# Patient Record
Sex: Male | Born: 1980
Health system: Southern US, Community
[De-identification: ages and names within clinical notes are randomized; demographics above are authoritative.]

## PROBLEM LIST (undated history)

## (undated) DIAGNOSIS — F32A Depression, unspecified: Secondary | ICD-10-CM

## (undated) DIAGNOSIS — E1165 Type 2 diabetes mellitus with hyperglycemia: Secondary | ICD-10-CM

## (undated) DIAGNOSIS — M109 Gout, unspecified: Secondary | ICD-10-CM

## (undated) DIAGNOSIS — L03115 Cellulitis of right lower limb: Secondary | ICD-10-CM

## (undated) DIAGNOSIS — M1990 Unspecified osteoarthritis, unspecified site: Secondary | ICD-10-CM

## (undated) DIAGNOSIS — E785 Hyperlipidemia, unspecified: Secondary | ICD-10-CM

## (undated) DIAGNOSIS — E7800 Pure hypercholesterolemia, unspecified: Secondary | ICD-10-CM

## (undated) DIAGNOSIS — E119 Type 2 diabetes mellitus without complications: Secondary | ICD-10-CM

## (undated) DIAGNOSIS — K219 Gastro-esophageal reflux disease without esophagitis: Secondary | ICD-10-CM

## (undated) DIAGNOSIS — I10 Essential (primary) hypertension: Secondary | ICD-10-CM

## (undated) DIAGNOSIS — I639 Cerebral infarction, unspecified: Secondary | ICD-10-CM

## (undated) DIAGNOSIS — G4733 Obstructive sleep apnea (adult) (pediatric): Secondary | ICD-10-CM

## (undated) DIAGNOSIS — U071 COVID-19: Secondary | ICD-10-CM

## (undated) DIAGNOSIS — R569 Unspecified convulsions: Secondary | ICD-10-CM

## (undated) DIAGNOSIS — N179 Acute kidney failure, unspecified: Secondary | ICD-10-CM

## (undated) HISTORY — DX: Gout, unspecified: M10.9

## (undated) HISTORY — DX: Depression, unspecified: F32.A

## (undated) HISTORY — DX: Unspecified osteoarthritis, unspecified site: M19.90

## (undated) HISTORY — DX: Gastro-esophageal reflux disease without esophagitis: K21.9

## (undated) HISTORY — DX: Type 2 diabetes mellitus with hyperglycemia: E11.65

## (undated) HISTORY — PX: SHOULDER SURGERY: SHX246

## (undated) HISTORY — DX: Hyperlipidemia, unspecified: E78.5

## (undated) HISTORY — DX: Pure hypercholesterolemia, unspecified: E78.00

## (undated) HISTORY — DX: Type 2 diabetes mellitus without complications: E11.9

## (undated) HISTORY — PX: OTHER SURGICAL HISTORY: SHX169

## (undated) HISTORY — DX: COVID-19: U07.1

## (undated) HISTORY — DX: Cellulitis of right lower limb: L03.115

## (undated) HISTORY — DX: Unspecified convulsions: R56.9

## (undated) HISTORY — DX: Essential (primary) hypertension: I10

## (undated) HISTORY — PX: INCISION AND DRAINAGE FOOT: SHX1800

---

## 2001-02-01 ENCOUNTER — Emergency Department (HOSPITAL_COMMUNITY): Admission: EM | Admit: 2001-02-01 | Discharge: 2001-02-01 | Payer: Self-pay

## 2003-06-10 ENCOUNTER — Emergency Department (HOSPITAL_COMMUNITY): Admission: AD | Admit: 2003-06-10 | Discharge: 2003-06-10 | Payer: Self-pay | Admitting: Family Medicine

## 2005-11-13 ENCOUNTER — Emergency Department (HOSPITAL_COMMUNITY): Admission: EM | Admit: 2005-11-13 | Discharge: 2005-11-13 | Payer: Self-pay | Admitting: Emergency Medicine

## 2005-11-13 IMAGING — CT CT ABD /PELWO
2 series · 16 of 42 positions shown, 20 images · non-contrast
Comparison: none

______________________________________________________________
RYKER [DATE] RYKER [DATE]

REASON FOR CONSULTATION: R/O KIDNEY MASS
_____________________________
_______________
EXAM: ULTRASOUND ABDOMEN GEN SURV.
The liver, gallbladder, pancreas, spleen and kidneys are normal in
appearance. The common bile duct is normal at 5 mm in diameter. There
is no evidence of ascites. Incidental note is made of a fold within the
gallbladder which is a normal variant. No stones were seen.

[Series 2: wo · axial · 0.94mm/px · z∈[+329,+806]mm · 13 of 177 slices shown, 16 images]
[im 12/177  soft-tissue]
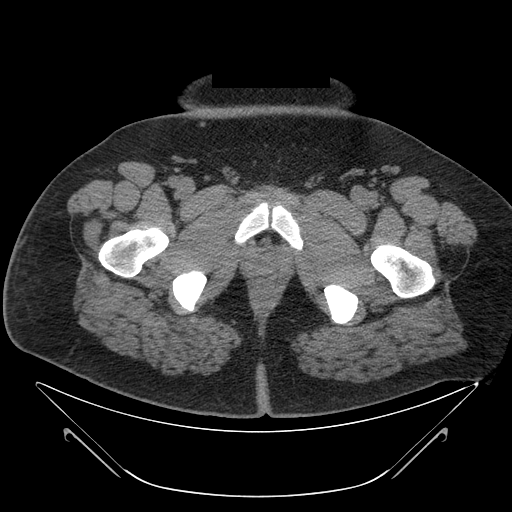
[im 12/177  bone]
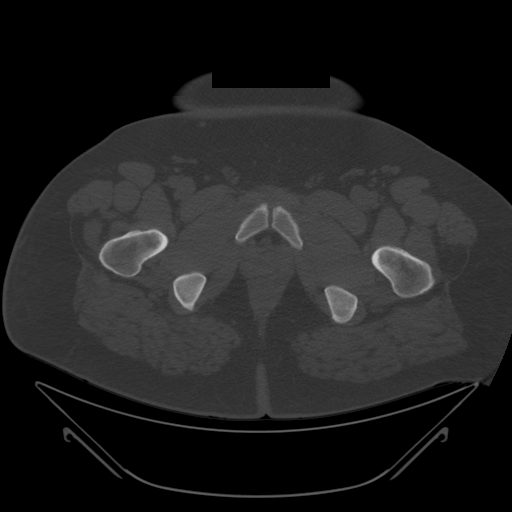
[im 29/177  soft-tissue]
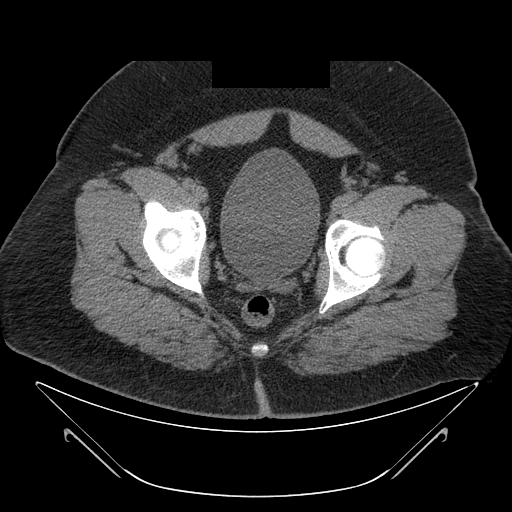
[im 46/177  soft-tissue]
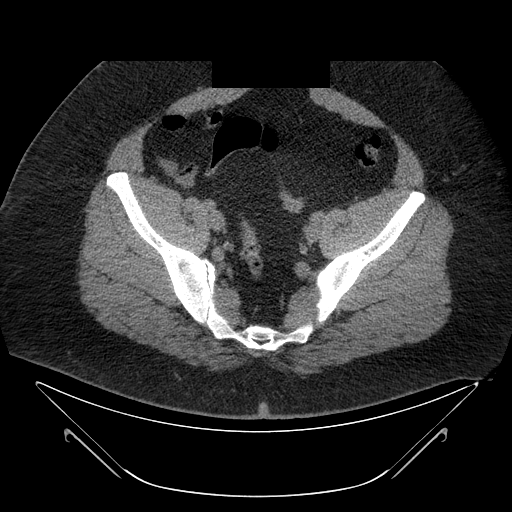
[im 63/177  soft-tissue]
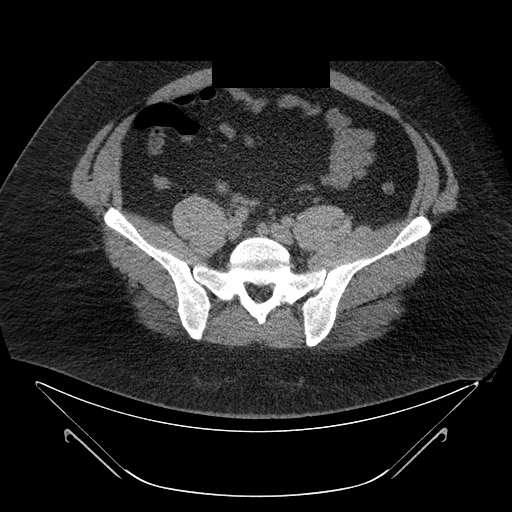
[im 80/177  soft-tissue]
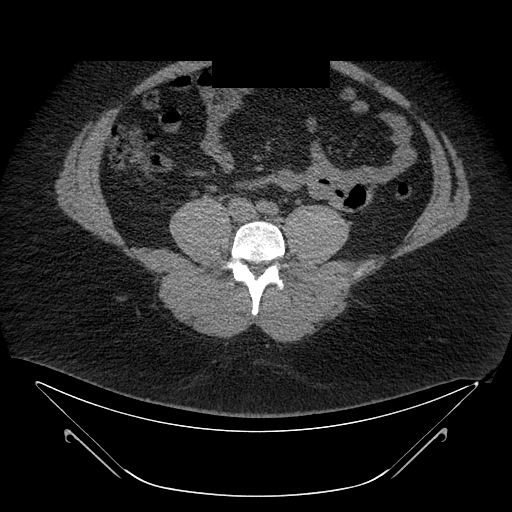
[im 97/177  soft-tissue]
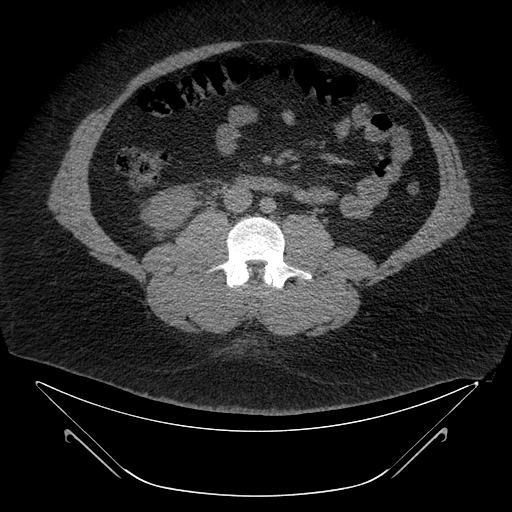
[im 114/177  soft-tissue]
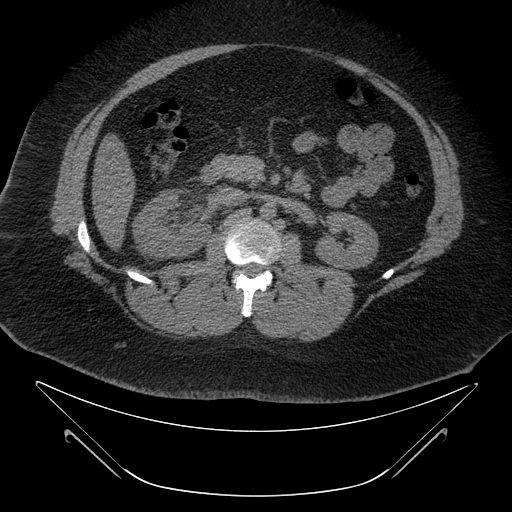
[im 131/177  soft-tissue]
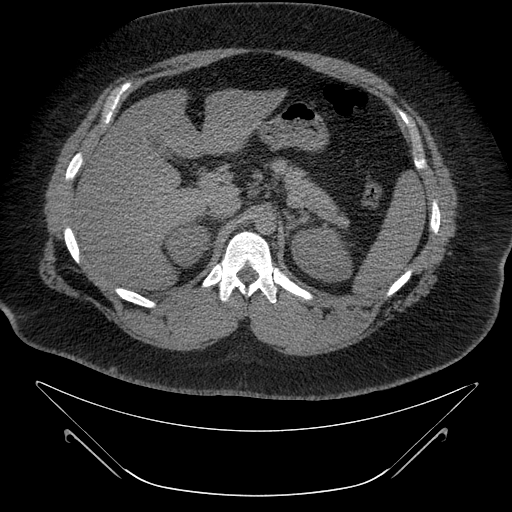
[im 148/177  soft-tissue]
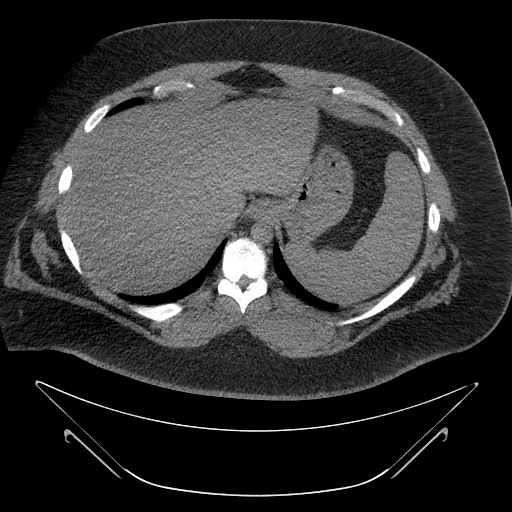
[im 148/177  bone]
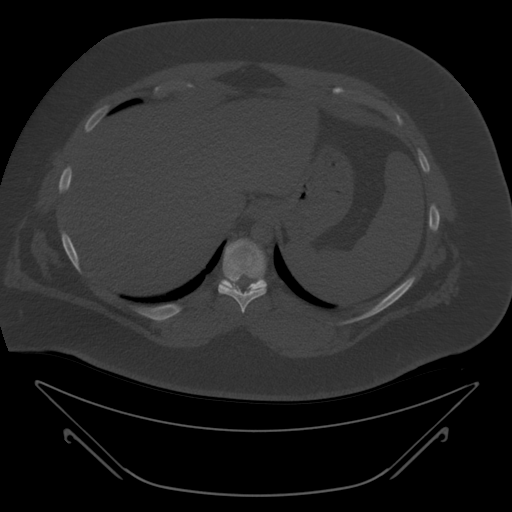
[im 154/177  lung]
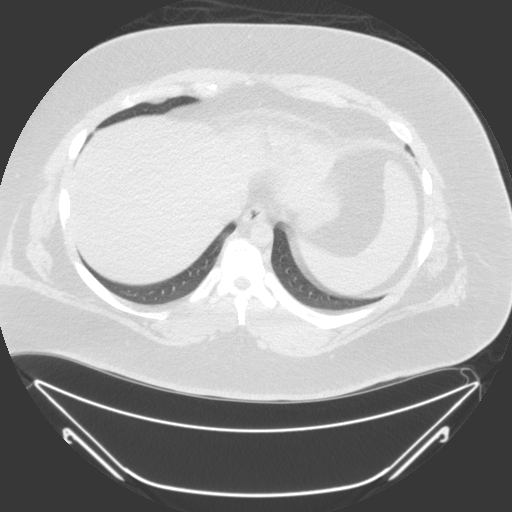
[im 159/177  lung]
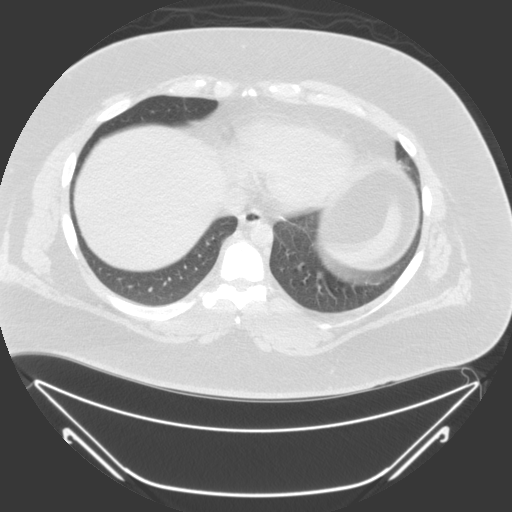
[im 165/177  soft-tissue]
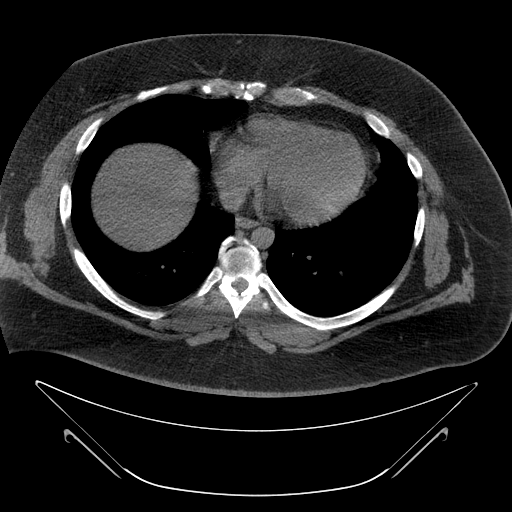
[im 165/177  lung]
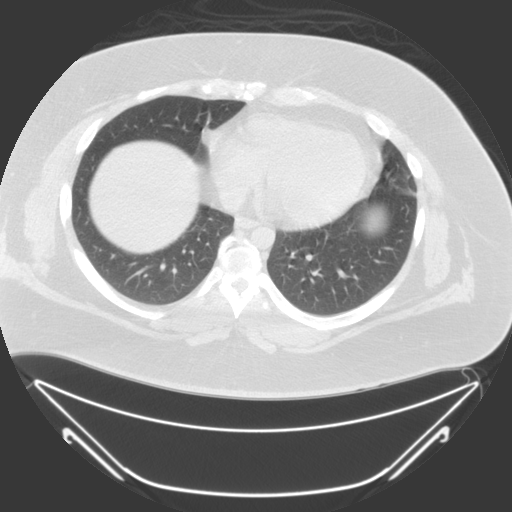
[im 171/177  lung]
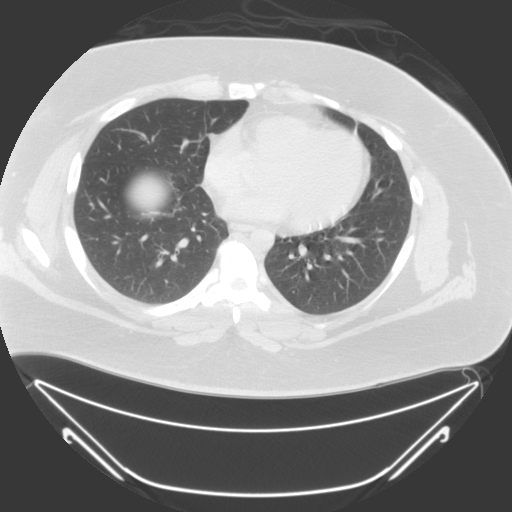

[coronals · coronal · 1.03mm/px · 3 of 104 slices shown, 4 images]
[im 35/104  soft-tissue]
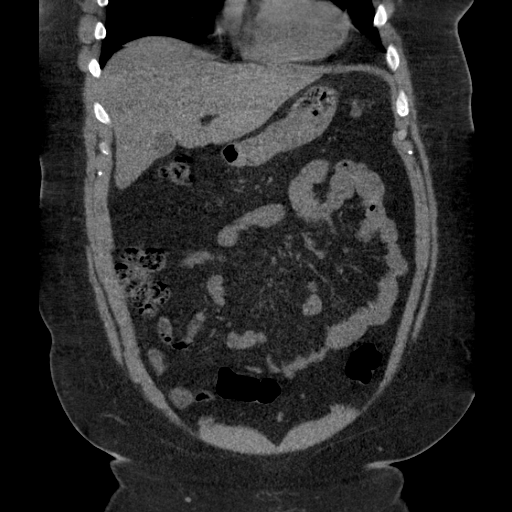
[im 46/104  soft-tissue]
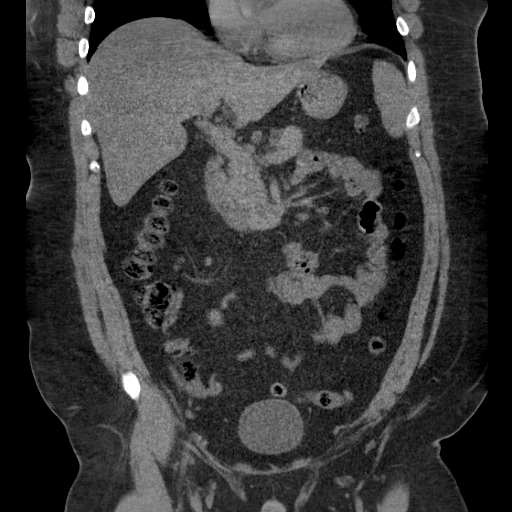
[im 46/104  bone]
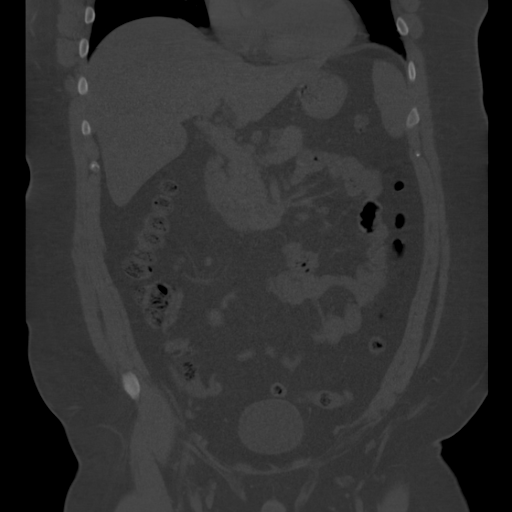
[im 58/104  soft-tissue]
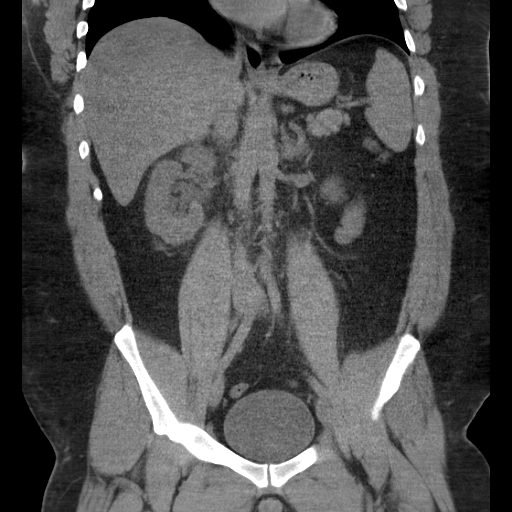

[16 of 42 positions shown; findings below may reference images not displayed]

IMPRESSION: 1. Normal abdominal ultrasound. No renal mass was identified either.
 If there remains strong clinical suspicion of a renal mass, then a
 dedicated CT scan without and with contrast would be a useful next
 step.

[VZ] [DATE] [DATE]
 Patient RYKER [DATE]

## 2008-11-20 ENCOUNTER — Emergency Department (HOSPITAL_BASED_OUTPATIENT_CLINIC_OR_DEPARTMENT_OTHER): Admission: EM | Admit: 2008-11-20 | Discharge: 2008-11-20 | Payer: Self-pay | Admitting: Emergency Medicine

## 2008-11-20 ENCOUNTER — Ambulatory Visit: Payer: Self-pay | Admitting: Interventional Radiology

## 2008-11-20 IMAGING — CR DG CHEST 2V
3 series · 3 of 3 positions shown · non-contrast
Comparison: None

CLINICAL DATA: Fever and cough

CHEST - 2 VIEW

[w chest pa]
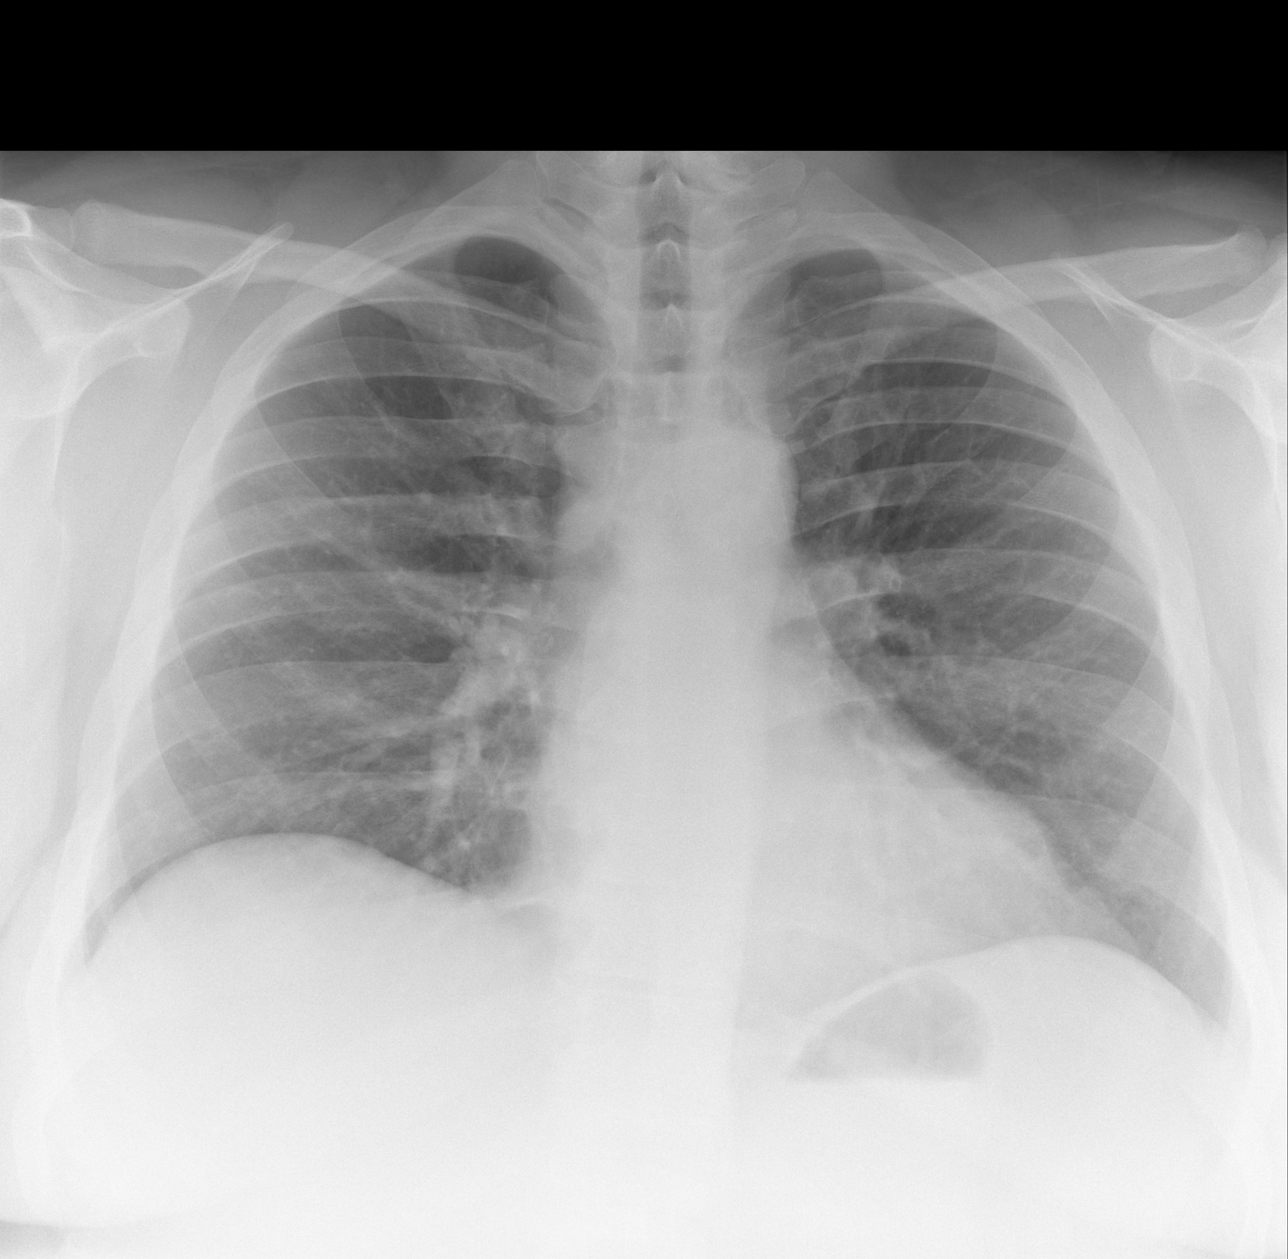

[w chest lat]
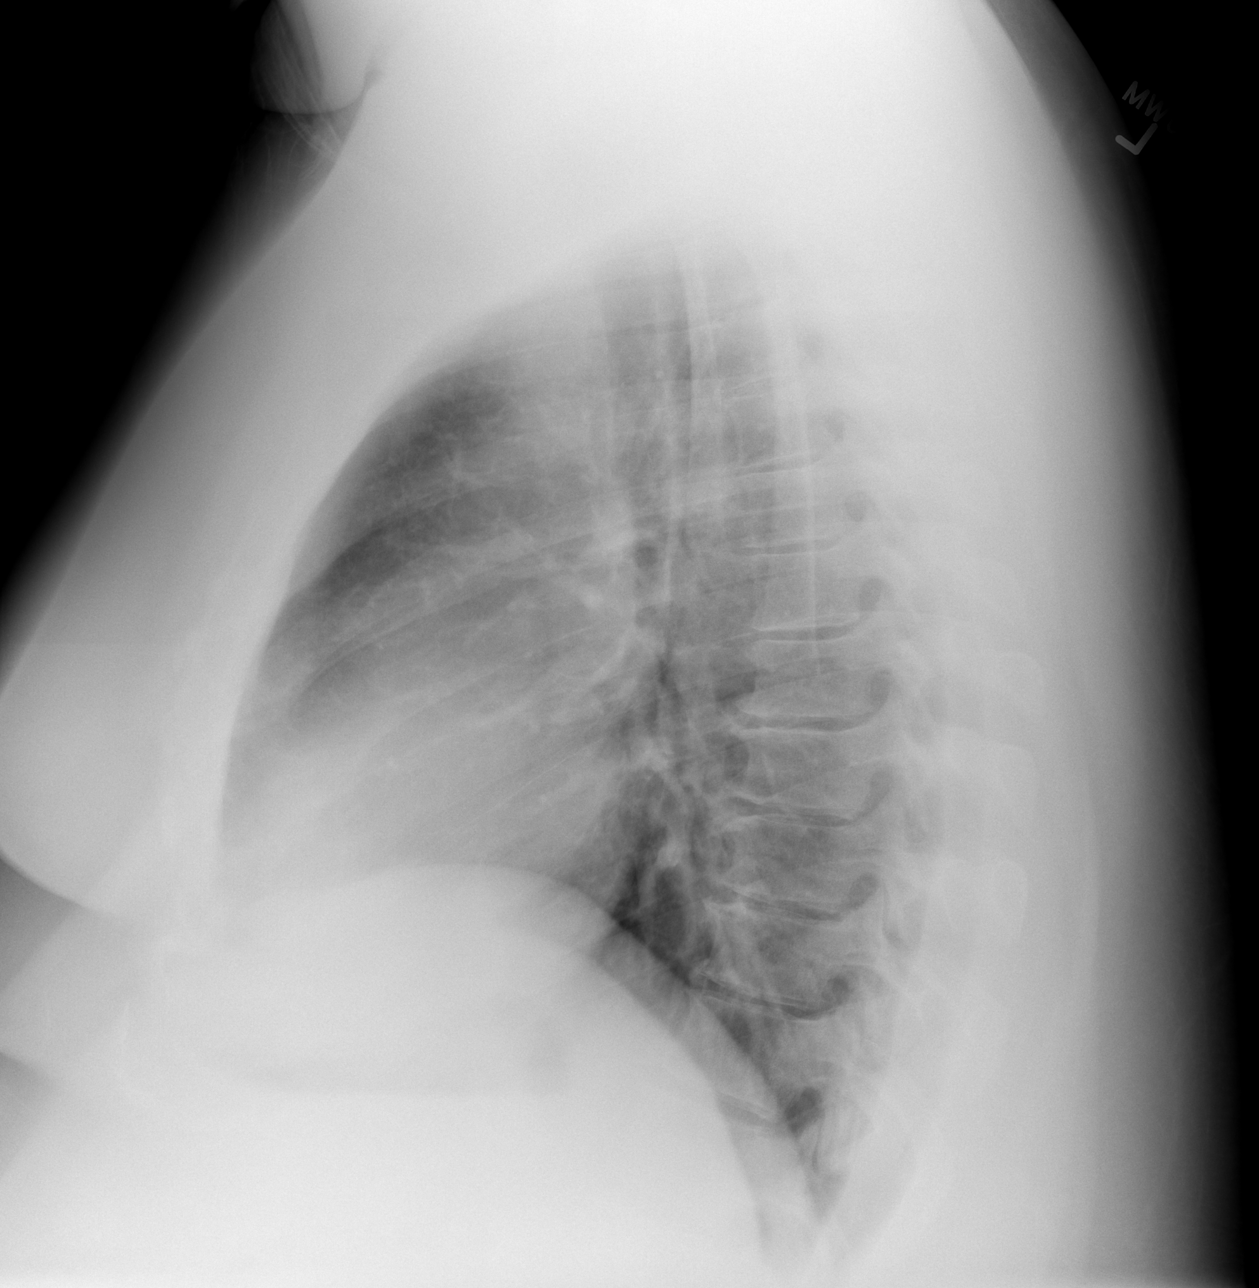

[w chest ap]
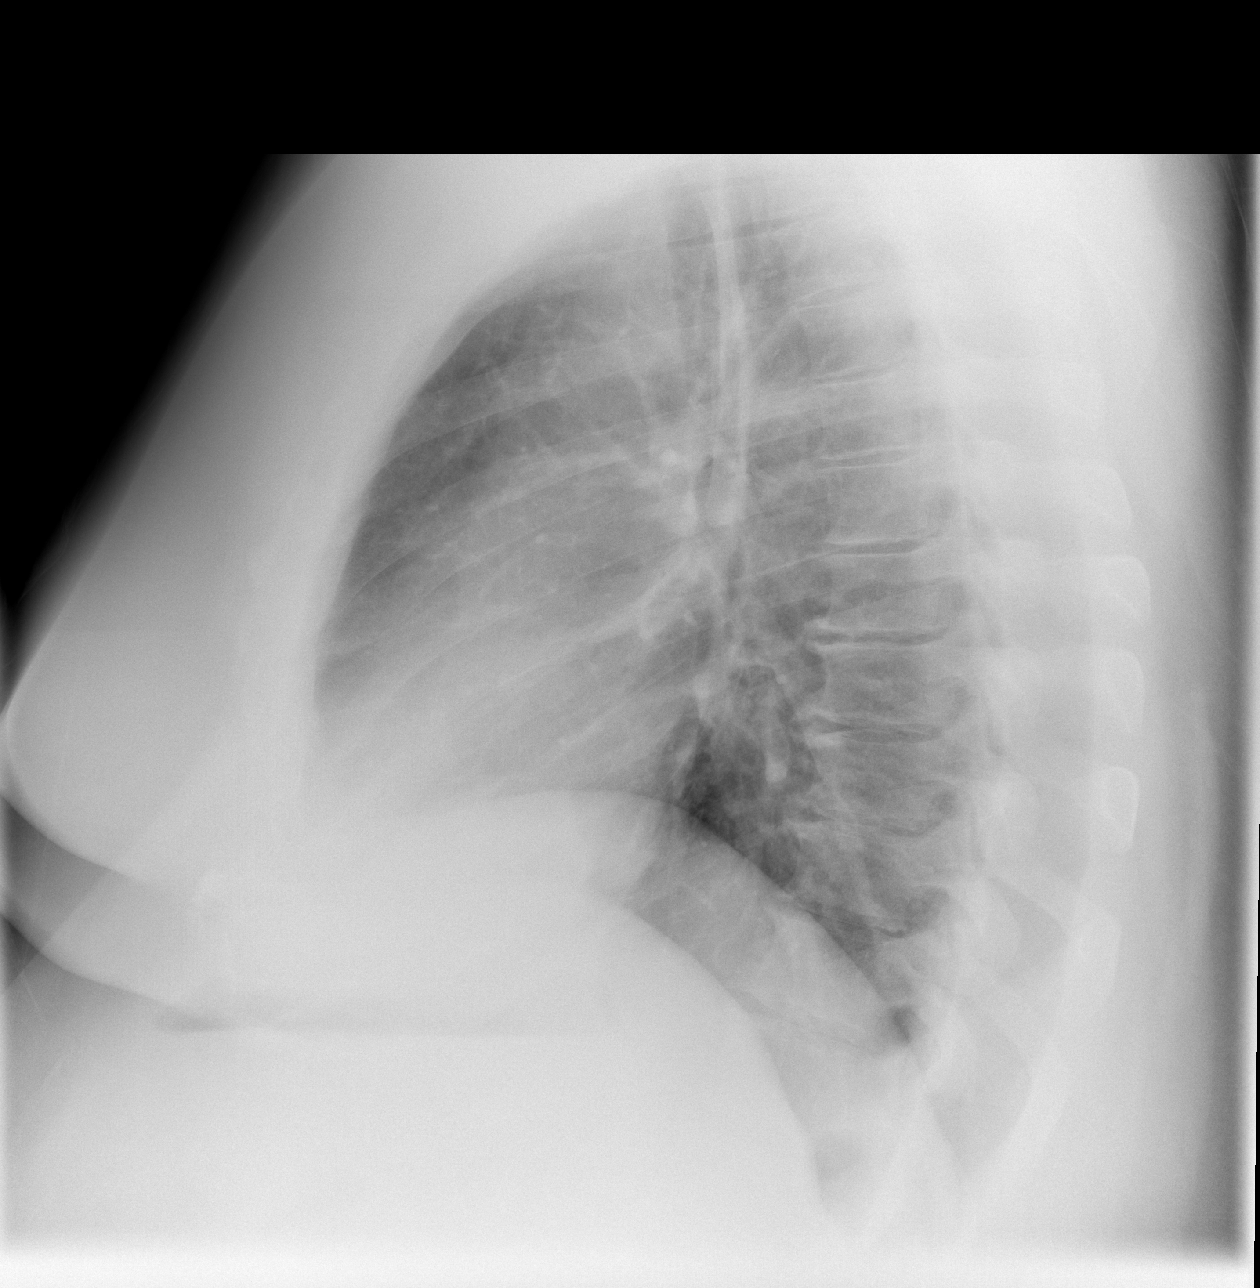

[3 of 3 positions shown; findings below may reference images not displayed]

FINDINGS: The heart size and mediastinal contours are within normal
limits.  Both lungs are clear.  The visualized skeletal structures
are unremarkable.
IMPRESSION: No active disease.

## 2010-10-14 LAB — CBC
HCT: 45.5 % (ref 39.0–52.0)
Hemoglobin: 15.5 g/dL (ref 13.0–17.0)
MCHC: 34.1 g/dL (ref 30.0–36.0)
MCV: 87 fL (ref 78.0–100.0)
Platelets: 171 10*3/uL (ref 150–400)
RBC: 5.23 MIL/uL (ref 4.22–5.81)
RDW: 11.8 % (ref 11.5–15.5)
WBC: 12 10*3/uL — ABNORMAL HIGH (ref 4.0–10.5)

## 2010-10-14 LAB — BASIC METABOLIC PANEL
BUN: 13 mg/dL (ref 6–23)
CO2: 29 mEq/L (ref 19–32)
Calcium: 9.5 mg/dL (ref 8.4–10.5)
Chloride: 102 mEq/L (ref 96–112)
Creatinine, Ser: 1.1 mg/dL (ref 0.4–1.5)
GFR calc Af Amer: >60 mL/min (ref >60)
GFR calc non Af Amer: >60 mL/min (ref >60)
Glucose, Bld: 116 mg/dL — ABNORMAL HIGH (ref 70–99)
Potassium: 4 mEq/L (ref 3.5–5.1)
Sodium: 143 mEq/L (ref 135–145)

## 2010-10-14 LAB — DIFFERENTIAL
Basophils Absolute: 0.2 10*3/uL — ABNORMAL HIGH (ref 0.0–0.1)
Basophils Relative: 2 % — ABNORMAL HIGH (ref 0–1)
Eosinophils Absolute: 0 10*3/uL (ref 0.0–0.7)
Eosinophils Relative: 0 % (ref 0–5)
Lymphocytes Relative: 19 % (ref 12–46)
Lymphs Abs: 2.3 10*3/uL (ref 0.7–4.0)
Monocytes Absolute: 1.4 10*3/uL — ABNORMAL HIGH (ref 0.1–1.0)
Monocytes Relative: 11 % (ref 3–12)
Neutro Abs: 8.1 10*3/uL — ABNORMAL HIGH (ref 1.7–7.7)
Neutrophils Relative %: 67 % (ref 43–77)

## 2010-10-14 LAB — URINALYSIS, ROUTINE W REFLEX MICROSCOPIC
Bilirubin Urine: NEGATIVE
Glucose, UA: NEGATIVE mg/dL
Hgb urine dipstick: NEGATIVE
Ketones, ur: NEGATIVE mg/dL
Protein, ur: NEGATIVE mg/dL
pH: 7 (ref 5.0–8.0)

## 2010-10-14 LAB — URINE CULTURE: Colony Count: 7000

## 2010-10-14 LAB — URINE MICROSCOPIC-ADD ON

## 2011-09-02 ENCOUNTER — Other Ambulatory Visit (HOSPITAL_COMMUNITY): Payer: Self-pay | Admitting: Orthopaedic Surgery

## 2011-09-03 ENCOUNTER — Encounter (HOSPITAL_COMMUNITY): Payer: Self-pay

## 2011-09-03 ENCOUNTER — Other Ambulatory Visit: Payer: Self-pay

## 2011-09-03 ENCOUNTER — Encounter (HOSPITAL_COMMUNITY)
Admission: RE | Admit: 2011-09-03 | Discharge: 2011-09-03 | Disposition: A | Discharge status: Home / Self Care | Payer: PRIVATE HEALTH INSURANCE | Source: Ambulatory Visit | Attending: Orthopaedic Surgery | Admitting: Orthopaedic Surgery

## 2011-09-03 ENCOUNTER — Encounter (HOSPITAL_COMMUNITY)
Admission: RE | Admit: 2011-09-03 | Discharge: 2011-09-03 | Disposition: A | Discharge status: Home / Self Care | Payer: PRIVATE HEALTH INSURANCE | Source: Ambulatory Visit | Attending: Anesthesiology | Admitting: Anesthesiology

## 2011-09-03 HISTORY — DX: Gastro-esophageal reflux disease without esophagitis: K21.9

## 2011-09-03 HISTORY — DX: Essential (primary) hypertension: I10

## 2011-09-03 LAB — CBC
HCT: 45 % (ref 39.0–52.0)
Hemoglobin: 16.1 g/dL (ref 13.0–17.0)
MCH: 29.9 pg (ref 26.0–34.0)
MCHC: 35.8 g/dL (ref 30.0–36.0)
RDW: 12.4 % (ref 11.5–15.5)

## 2011-09-03 LAB — BASIC METABOLIC PANEL
BUN: 11 mg/dL (ref 6–23)
Chloride: 102 mEq/L (ref 96–112)
Creatinine, Ser: 1.02 mg/dL (ref 0.50–1.35)
GFR calc non Af Amer: >90 mL/min (ref >90)
Glucose, Bld: 88 mg/dL (ref 70–99)
Potassium: 4.2 mEq/L (ref 3.5–5.1)

## 2011-09-03 IMAGING — CR DG CHEST 2V
2 series · 2 of 2 positions shown · non-contrast
Comparison: [DATE]

CLINICAL DATA: Preoperative evaluation for rotator cuff repair.
Hypertension.  Gastroesophageal reflux disease.  Nonsmoker

CHEST - 2 VIEW

[view not recorded (1 of 2)]
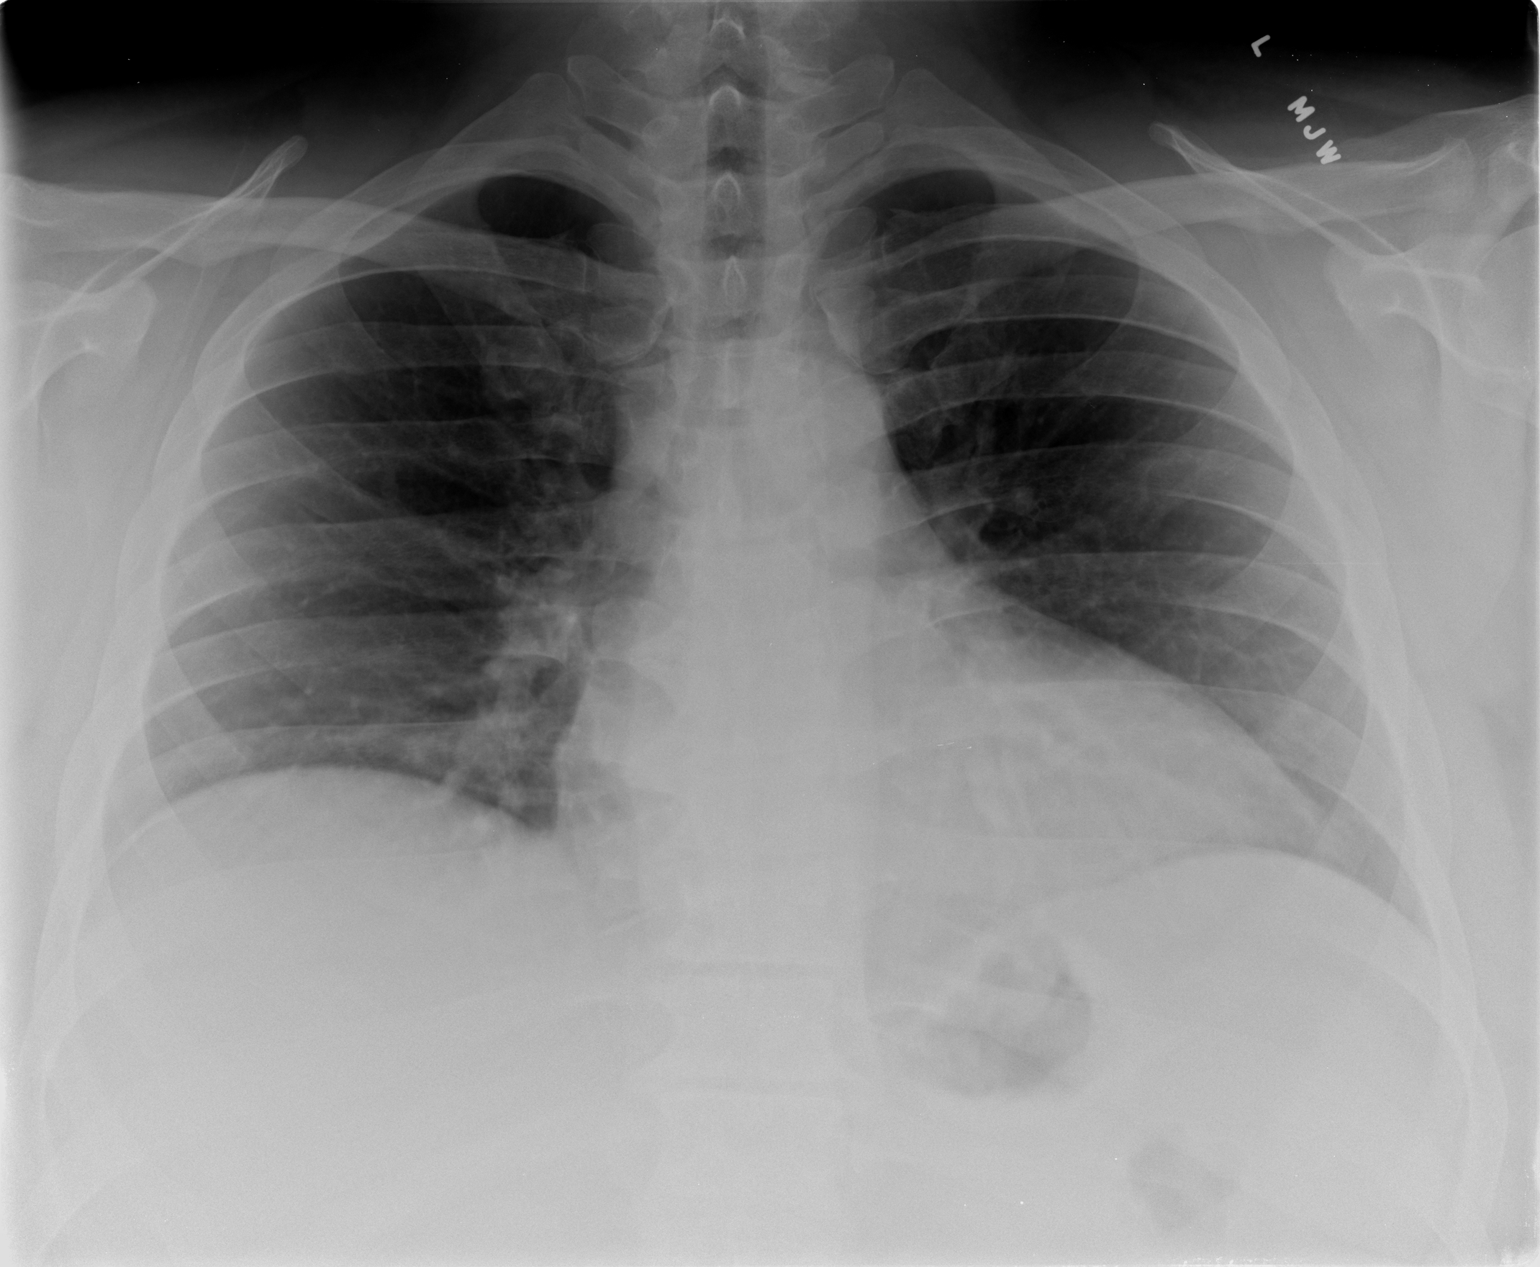

[view not recorded (2 of 2)]
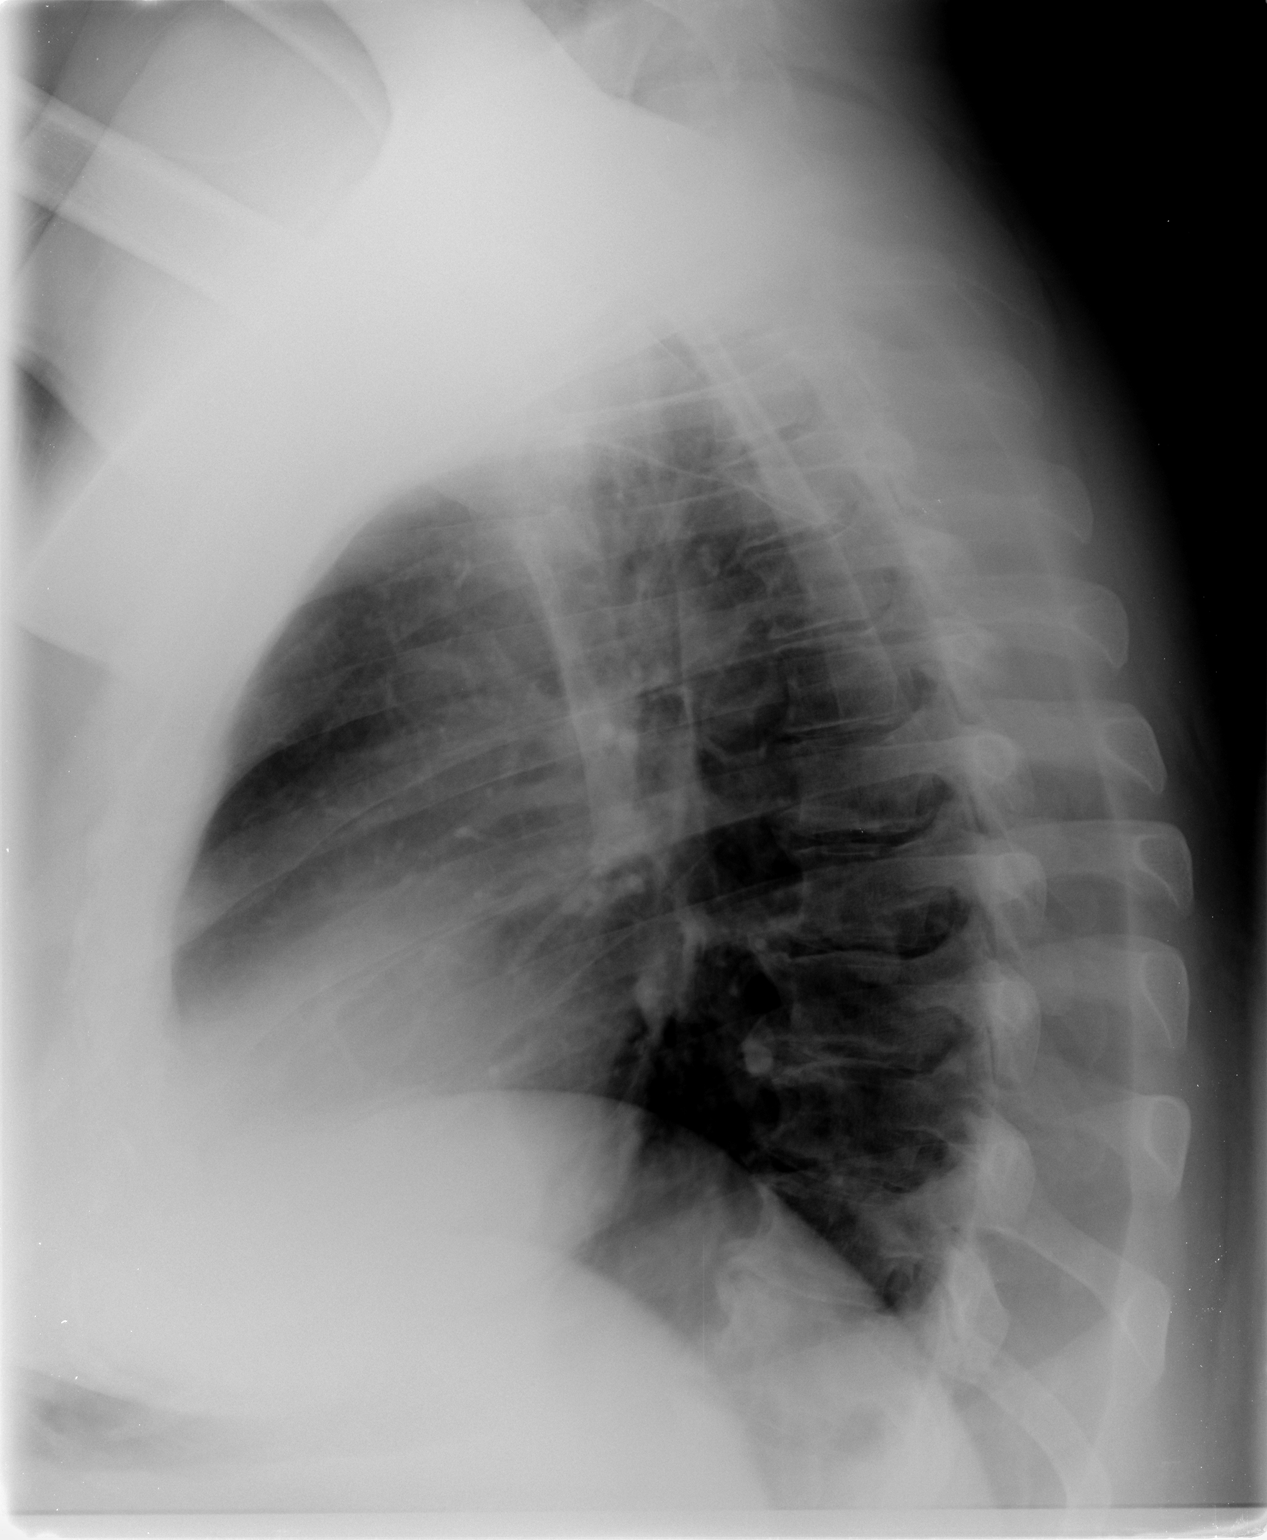

[2 of 2 positions shown; findings below may reference images not displayed]

FINDINGS: Low lung volumes are present with resultant crowding of
bronchovascular markings at both lung bases.  Taking this into
consideration, heart size is upper limits of normal to mildly
enlarged.  The lung fields are clear with no signs of focal
infiltrate or congestive failure.  No pleural fluid or significant
peribronchial cuffing is noted. The costophrenic angles have been
excluded from the lateral view.

Bony structures demonstrate degenerative osteophytosis of the mid
and lower thoracic spine and are otherwise intact.
IMPRESSION: Low lung volumes with no worrisome focal or acute cardiopulmonary
abnormality identified.

## 2011-09-03 MED ORDER — CEFAZOLIN SODIUM-DEXTROSE 2-3 GM-% IV SOLR: 2.0000 g | INTRAVENOUS | Status: DC

## 2011-09-03 NOTE — Pre-Procedure Instructions (Signed)
Justin Mason  09/03/2011   Your procedure is scheduled on: 09/08/2011  Report to [facility] at [phone] AM.  Call this number if you have problems the morning of surgery: [phone]   Remember:   Do not eat food:After Midnight.  May have clear liquids: up to 4 Hours before arrival.0550  Clear liquids include soda, tea, black coffee, apple or grape juice, broth.  Take these medicines the morning of surgery with A SIP OF WATER: ATENOLOL  PREVACID   Do not wear jewelry, make-up or nail polish.  Do not wear lotions, powders, or perfumes. You may wear deodorant.  Do not shave 48 hours prior to surgery.  Do not bring valuables to the hospital.  Contacts, dentures or bridgework may not be worn into surgery.  Leave suitcase in the car. After surgery it may be brought to your room.  For patients admitted to the hospital, checkout time is 11:00 AM the day of discharge.   Patients discharged the day of surgery will not be allowed to drive home.  Name and phone number of your driver: LINDA- MOTHER [phone]  Special Instructions: CHG Shower Use Special Wash: 1/2 bottle night before surgery and 1/2 bottle morning of surgery.   Please read over the following fact sheets that you were given: Pain Booklet, Coughing and Deep Breathing, MRSA Information and Surgical Site Infection Prevention

## 2011-09-03 NOTE — Progress Notes (Signed)
SPOKE WITH DR CREWS ... HE DOES NOT NEED TO SEE PATIENT TODAY... BUT WILL EVAL AND SEE DAY OF SURGERY.

## 2011-09-07 NOTE — Consult Note (Signed)
Anesthesia:  Patient is a 31 year old male scheduled for right shoulder arthroscopy with rotator cuff repair on 09/08/11.  History includes HTN, GERD, non-smoker, and morbid obesity (410 lb).  He is on atenolol, lisinopril/HCTZ for BP.  His BP was elevated at 161/109 at his PAT appointment on 09/03/11.   Labs noted.  CXR showed low lung volumes with no worrisome focal or acute cardiopulmonary  abnormality identified.  EKG shows SB, low voltage QRS, possible inferior infarct.  Currently there are no comparison EKGs available.  I've been unable to reach him at his home phone number today to clinically correlate, but no CV symptoms reported at his PAT appointment.  Clinical correlation on the day of surgery.  Anesthesiologist Dr. Chaney Malling updated.

## 2011-09-08 ENCOUNTER — Encounter (HOSPITAL_COMMUNITY)
Admission: RE | Disposition: A | Discharge status: Home / Self Care | Payer: Self-pay | Source: Ambulatory Visit | Attending: Orthopaedic Surgery

## 2011-09-08 ENCOUNTER — Encounter (HOSPITAL_COMMUNITY): Payer: Self-pay | Admitting: Vascular Surgery

## 2011-09-08 ENCOUNTER — Encounter (HOSPITAL_COMMUNITY): Payer: Self-pay | Admitting: Surgery

## 2011-09-08 ENCOUNTER — Ambulatory Visit (HOSPITAL_COMMUNITY)
Admission: RE | Admit: 2011-09-08 | Discharge: 2011-09-08 | Disposition: A | Discharge status: Home / Self Care | Payer: PRIVATE HEALTH INSURANCE | Source: Ambulatory Visit | Attending: Orthopaedic Surgery | Admitting: Orthopaedic Surgery

## 2011-09-08 ENCOUNTER — Ambulatory Visit (HOSPITAL_COMMUNITY): Payer: PRIVATE HEALTH INSURANCE | Admitting: Vascular Surgery

## 2011-09-08 DIAGNOSIS — Z01818 Encounter for other preprocedural examination: Secondary | ICD-10-CM | POA: Insufficient documentation

## 2011-09-08 DIAGNOSIS — Z01810 Encounter for preprocedural cardiovascular examination: Secondary | ICD-10-CM | POA: Insufficient documentation

## 2011-09-08 DIAGNOSIS — K219 Gastro-esophageal reflux disease without esophagitis: Secondary | ICD-10-CM | POA: Insufficient documentation

## 2011-09-08 DIAGNOSIS — F17200 Nicotine dependence, unspecified, uncomplicated: Secondary | ICD-10-CM | POA: Insufficient documentation

## 2011-09-08 DIAGNOSIS — M719 Bursopathy, unspecified: Secondary | ICD-10-CM | POA: Insufficient documentation

## 2011-09-08 DIAGNOSIS — M75101 Unspecified rotator cuff tear or rupture of right shoulder, not specified as traumatic: Secondary | ICD-10-CM

## 2011-09-08 DIAGNOSIS — I10 Essential (primary) hypertension: Secondary | ICD-10-CM | POA: Insufficient documentation

## 2011-09-08 DIAGNOSIS — Z01812 Encounter for preprocedural laboratory examination: Secondary | ICD-10-CM | POA: Insufficient documentation

## 2011-09-08 DIAGNOSIS — M67919 Unspecified disorder of synovium and tendon, unspecified shoulder: Secondary | ICD-10-CM | POA: Insufficient documentation

## 2011-09-08 HISTORY — DX: Unspecified rotator cuff tear or rupture of right shoulder, not specified as traumatic: M75.101

## 2011-09-08 SURGERY — SHOULDER ARTHROSCOPY WITH ROTATOR CUFF REPAIR AND SUBACROMIAL DECOMPRESSION
Anesthesia: General | Site: Shoulder | Laterality: Right | Wound class: Clean

## 2011-09-08 MED ORDER — ACETAMINOPHEN 10 MG/ML IV SOLN
INTRAVENOUS | Status: DC | PRN
  Administered 2011-09-08: 1000 mg via INTRAVENOUS

## 2011-09-08 MED ORDER — ROCURONIUM BROMIDE 100 MG/10ML IV SOLN
INTRAVENOUS | Status: DC | PRN
  Administered 2011-09-08: 30 mg via INTRAVENOUS
  Administered 2011-09-08 (×2): 10 mg via INTRAVENOUS

## 2011-09-08 MED ORDER — MIDAZOLAM HCL 5 MG/5ML IJ SOLN
INTRAMUSCULAR | Status: DC | PRN
  Administered 2011-09-08: 2 mg via INTRAVENOUS

## 2011-09-08 MED ORDER — EPINEPHRINE HCL 1 MG/ML IJ SOLN
INTRAMUSCULAR | Status: DC | PRN
  Administered 2011-09-08: 1 mg

## 2011-09-08 MED ORDER — OXYCODONE-ACETAMINOPHEN 5-325 MG PO TABS: 2.0000 | ORAL_TABLET | Freq: Once | ORAL | Status: DC

## 2011-09-08 MED ORDER — LACTATED RINGERS IV SOLN
INTRAVENOUS | Status: DC
  Administered 2011-09-08: 12:00:00 via INTRAVENOUS

## 2011-09-08 MED ORDER — LACTATED RINGERS IV SOLN
INTRAVENOUS | Status: DC | PRN
  Administered 2011-09-08 (×2): via INTRAVENOUS

## 2011-09-08 MED ORDER — SODIUM CHLORIDE 0.9 % IV SOLN
200.0000 ug | INTRAVENOUS | Status: DC | PRN
  Administered 2011-09-08: .42 ug/kg/h via INTRAVENOUS

## 2011-09-08 MED ORDER — FENTANYL CITRATE 0.05 MG/ML IJ SOLN
INTRAMUSCULAR | Status: DC | PRN
  Administered 2011-09-08: 200 ug via INTRAVENOUS
  Administered 2011-09-08: 50 ug via INTRAVENOUS

## 2011-09-08 MED ORDER — ONDANSETRON HCL 4 MG/2ML IJ SOLN: 4.0000 mg | Freq: Once | INTRAMUSCULAR | Status: DC | PRN

## 2011-09-08 MED ORDER — ONDANSETRON HCL 4 MG/2ML IJ SOLN
INTRAMUSCULAR | Status: DC | PRN
  Administered 2011-09-08: 4 mg via INTRAVENOUS

## 2011-09-08 MED ORDER — CEFAZOLIN SODIUM 1-5 GM-% IV SOLN
INTRAVENOUS | Status: AC
  Filled 2011-09-08: qty 100

## 2011-09-08 MED ORDER — MORPHINE SULFATE 4 MG/ML IJ SOLN
INTRAMUSCULAR | Status: DC | PRN
  Administered 2011-09-08: 1.5 mg via INTRAVENOUS

## 2011-09-08 MED ORDER — HYDROMORPHONE HCL PF 1 MG/ML IJ SOLN
0.2500 mg | INTRAMUSCULAR | Status: DC | PRN
  Administered 2011-09-08 (×2): 0.5 mg via INTRAVENOUS

## 2011-09-08 MED ORDER — OXYCODONE-ACETAMINOPHEN 5-325 MG PO TABS: 1.0000 | ORAL_TABLET | ORAL | Status: AC | PRN

## 2011-09-08 MED ORDER — SODIUM CHLORIDE 0.9 % IV SOLN
0.4000 ug/kg/h | INTRAVENOUS | Status: DC
  Filled 2011-09-08: qty 2

## 2011-09-08 MED ORDER — PROPOFOL 10 MG/ML IV EMUL
INTRAVENOUS | Status: DC | PRN
  Administered 2011-09-08: 250 mg via INTRAVENOUS

## 2011-09-08 MED ORDER — OXYCODONE-ACETAMINOPHEN 5-325 MG PO TABS
ORAL_TABLET | ORAL | Status: AC
  Filled 2011-09-08: qty 2

## 2011-09-08 MED ORDER — GLYCOPYRROLATE 0.2 MG/ML IJ SOLN
INTRAMUSCULAR | Status: DC | PRN
  Administered 2011-09-08: .8 mg via INTRAVENOUS

## 2011-09-08 MED ORDER — CEFAZOLIN SODIUM 1-5 GM-% IV SOLN
INTRAVENOUS | Status: DC | PRN
  Administered 2011-09-08: 2 g via INTRAVENOUS

## 2011-09-08 MED ORDER — NEOSTIGMINE METHYLSULFATE 1 MG/ML IJ SOLN
INTRAMUSCULAR | Status: DC | PRN
  Administered 2011-09-08: 5 mg via INTRAVENOUS

## 2011-09-08 MED ORDER — ACETAMINOPHEN 10 MG/ML IV SOLN
INTRAVENOUS | Status: AC
  Filled 2011-09-08: qty 100

## 2011-09-08 MED ORDER — SUCCINYLCHOLINE CHLORIDE 20 MG/ML IJ SOLN
INTRAMUSCULAR | Status: DC | PRN
  Administered 2011-09-08: 200 mg via INTRAVENOUS

## 2011-09-08 MED ORDER — SODIUM CHLORIDE 0.9 % IR SOLN
Status: DC | PRN
  Administered 2011-09-08: 3000 mL

## 2011-09-08 MED ORDER — EPHEDRINE SULFATE 50 MG/ML IJ SOLN
INTRAMUSCULAR | Status: DC | PRN
  Administered 2011-09-08: 5 mg via INTRAVENOUS

## 2011-09-08 MED ORDER — BUPIVACAINE HCL 0.5 % IJ SOLN
INTRAMUSCULAR | Status: DC | PRN
  Administered 2011-09-08: 8.5 mL

## 2011-09-08 MED ORDER — LIDOCAINE HCL (CARDIAC) 20 MG/ML IV SOLN
INTRAVENOUS | Status: DC | PRN
  Administered 2011-09-08: 50 mg via INTRAVENOUS

## 2011-09-08 SURGICAL SUPPLY — 51 items
BLADE CUDA 4.2 (BLADE) ×2 IMPLANT
BLADE SURG 11 STRL SS (BLADE) ×2 IMPLANT
BLADE SURG ROTATE 9660 (MISCELLANEOUS) IMPLANT
BUR VERTEX HOODED 4.5 (BURR) IMPLANT
CANNULA 5.75X7 CRYSTAL CLEAR (CANNULA) IMPLANT
CANNULA SHOULDER 7CM (CANNULA) ×4 IMPLANT
CANNULA TWIST IN 8.25X7CM (CANNULA) IMPLANT
CLOTH BEACON ORANGE TIMEOUT ST (SAFETY) ×2 IMPLANT
COVER SURGICAL LIGHT HANDLE (MISCELLANEOUS) ×2 IMPLANT
DECANTER SPIKE VIAL GLASS SM (MISCELLANEOUS) IMPLANT
DRAPE INCISE IOBAN 66X45 STRL (DRAPES) ×2 IMPLANT
DRAPE SHOULDER BEACH CHAIR (DRAPES) ×2 IMPLANT
DRAPE SURG 17X23 STRL (DRAPES) IMPLANT
DRAPE U-SHAPE 47X51 STRL (DRAPES) ×2 IMPLANT
DRSG PAD ABDOMINAL 8X10 ST (GAUZE/BANDAGES/DRESSINGS) ×2 IMPLANT
DURAPREP 26ML APPLICATOR (WOUND CARE) ×2 IMPLANT
ELECT REM PT RETURN 9FT ADLT (ELECTROSURGICAL) ×2
ELECTRODE REM PT RTRN 9FT ADLT (ELECTROSURGICAL) ×1 IMPLANT
GAUZE SPONGE 4X4 12PLY STRL LF (GAUZE/BANDAGES/DRESSINGS) ×2 IMPLANT
GAUZE XEROFORM 1X8 LF (GAUZE/BANDAGES/DRESSINGS) ×2 IMPLANT
GLOVE BIOGEL PI IND STRL 8 (GLOVE) ×1 IMPLANT
GLOVE BIOGEL PI INDICATOR 8 (GLOVE) ×1
GLOVE ORTHO TXT STRL SZ7.5 (GLOVE) ×2 IMPLANT
GOWN PREVENTION PLUS LG XLONG (DISPOSABLE) IMPLANT
GOWN PREVENTION PLUS XLARGE (GOWN DISPOSABLE) ×2 IMPLANT
GOWN STRL NON-REIN LRG LVL3 (GOWN DISPOSABLE) ×4 IMPLANT
KIT BASIN OR (CUSTOM PROCEDURE TRAY) ×2 IMPLANT
KIT ROOM TURNOVER OR (KITS) ×2 IMPLANT
KIT SHOULDER TRACTION (DRAPES) ×2 IMPLANT
MANIFOLD NEPTUNE II (INSTRUMENTS) ×2 IMPLANT
NEEDLE 1/2 CIR CATGUT .05X1.09 (NEEDLE) IMPLANT
NEEDLE HYPO 25GX1X1/2 BEV (NEEDLE) IMPLANT
NEEDLE SCORPION (NEEDLE) IMPLANT
NEEDLE SPNL 18GX3.5 QUINCKE PK (NEEDLE) ×2 IMPLANT
NS IRRIG 1000ML POUR BTL (IV SOLUTION) ×2 IMPLANT
PACK SHOULDER (CUSTOM PROCEDURE TRAY) ×2 IMPLANT
PAD ARMBOARD 7.5X6 YLW CONV (MISCELLANEOUS) ×4 IMPLANT
SET ARTHROSCOPY TUBING (MISCELLANEOUS) ×1
SET ARTHROSCOPY TUBING LN (MISCELLANEOUS) ×1 IMPLANT
SLING ARM FOAM STRAP XLG (SOFTGOODS) ×2 IMPLANT
SPONGE GAUZE 4X4 12PLY (GAUZE/BANDAGES/DRESSINGS) ×2 IMPLANT
SPONGE LAP 4X18 X RAY DECT (DISPOSABLE) ×2 IMPLANT
STAPLER VISISTAT 35W (STAPLE) IMPLANT
STRIP CLOSURE SKIN 1/2X4 (GAUZE/BANDAGES/DRESSINGS) IMPLANT
SUT ETHILON 3 0 PS 1 (SUTURE) IMPLANT
SYR CONTROL 10ML LL (SYRINGE) ×2 IMPLANT
TAPE CLOTH SURG 6X10 WHT LF (GAUZE/BANDAGES/DRESSINGS) ×2 IMPLANT
TOWEL OR 17X24 6PK STRL BLUE (TOWEL DISPOSABLE) ×2 IMPLANT
TOWEL OR 17X26 10 PK STRL BLUE (TOWEL DISPOSABLE) ×2 IMPLANT
WAND 90 DEG TURBOVAC W/CORD (SURGICAL WAND) ×2 IMPLANT
WATER STERILE IRR 1000ML POUR (IV SOLUTION) ×2 IMPLANT

## 2011-09-08 NOTE — H&P (Signed)
Justin Mason is an 31 y.o. male.   Chief Complaint:   Right shoulder pain and weakness HPI:   31 yo morbidly obese male who injured his right shoulder early last year in a work-related accident.  A MRI was eventually obtained which showed a full-thickness rotator cuff tear.  Due to continued pain and weakness, he now presents of an arthroscopic intervention.  He understands due to retraction of the cuff and from the amount of time from his original injury to now makes for a difficult situation.  His obesity also makes for a more difficult surgery/anesthesia/etc.  He understands the risks and benefits of surgery and wishes to proceed with the hope of improving his shoulder function and decreasing his pain.  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  He has also had a problem with poorly controlled HTN, but his last BP in my office was near normal.  Past Medical History  Diagnosis Date  . Hypertension   . GERD (gastroesophageal reflux disease)     Past Surgical History  Procedure Date  . Incision and drainage foot 1991-1992    STEPPED ON NAIL / INFECTED WOUND    Family History  Problem Relation Age of Onset  . Anesthesia problems Sister    Social History:  reports that he has never smoked. His smokeless tobacco use includes Snuff. He reports that he drinks about 4.2 ounces of alcohol per week. He reports that he does not use illicit drugs.  Allergies: No Known Allergies  No current facility-administered medications on file as of 09/08/2011.   No current outpatient prescriptions on file as of 09/08/2011.    No results found for this or any previous visit (from the past 48 hour(s)). No results found.  Review of Systems  All other systems reviewed and are negative.    Blood pressure 173/96, pulse 68, temperature 98.4 F (36.9 C), temperature source Oral, resp. rate 20, SpO2 100.00%. Physical Exam  Constitutional: He is oriented to person, place, and time. He appears well-developed and  well-nourished.  HENT:  Head: Normocephalic and atraumatic.  Eyes: EOM are normal. Pupils are equal, round, and reactive to light.  Neck: Normal range of motion. Neck supple.  Cardiovascular: Normal rate and regular rhythm.   Respiratory: Effort normal and breath sounds normal.  GI: Soft. Bowel sounds are normal.  Musculoskeletal:       Right shoulder: He exhibits decreased range of motion, tenderness and decreased strength.  Neurological: He is alert and oriented to person, place, and time.  Skin: Skin is warm and dry.  Psychiatric: He has a normal mood and affect.     Assessment/Plan To the OR today for right shoulder arthroscopy.  Zahari Xiang Y 09/08/2011, 11:46 AM

## 2011-09-08 NOTE — Transfer of Care (Signed)
Immediate Anesthesia Transfer of Care Note  Patient: Justin Mason  Procedure(s) Performed: Procedure(s) (LRB): SHOULDER ARTHROSCOPY WITH ROTATOR CUFF REPAIR AND SUBACROMIAL DECOMPRESSION (Right)  Patient Location: PACU  Anesthesia Type: General  Level of Consciousness: awake, alert  and oriented  Airway & Oxygen Therapy: Patient Spontanous Breathing and Patient connected to face mask oxygen  Post-op Assessment: Report given to PACU RN and Post -op Vital signs reviewed and stable  Post vital signs: Reviewed and stable  Complications: No apparent anesthesia complicationsand in

## 2011-09-08 NOTE — Progress Notes (Signed)
Pt denies CV sx.  A. Zelenak,PA, notified of pt's arrival & that he denies sx.//L. Mystery Schrupp,RN

## 2011-09-08 NOTE — Brief Op Note (Signed)
09/08/2011  3:05 PM  PATIENT:  Justin Mason  31 y.o. male  PRE-OPERATIVE DIAGNOSIS:  right shoulder full-thickness rotator cuff tear  POST-OPERATIVE DIAGNOSIS:  right shoulder full-thickness rotator cuff tear  PROCEDURE:  Procedure(s) (LRB): SHOULDER ARTHROSCOPY WITH MINIMAL DEBRIDEMENT(Right)  SURGEON:  Surgeon(s) and Role:    * Kathryne Hitch, MD - Primary  PHYSICIAN ASSISTANT:   ASSISTANTS: none   ANESTHESIA:   general  EBL:  Total I/O In: 1000 [I.V.:1000] Out: -   BLOOD ADMINISTERED:none  DRAINS: none   LOCAL MEDICATIONS USED:  MARCAINE     SPECIMEN:  No Specimen  DISPOSITION OF SPECIMEN:  N/A  COUNTS:  YES  TOURNIQUET:  * No tourniquets in log *  DICTATION: .Other Dictation: Dictation Number [id number]  PLAN OF CARE: Discharge to home after PACU  PATIENT DISPOSITION:  PACU - hemodynamically stable.   Delay start of Pharmacological VTE agent (>24hrs) due to surgical blood loss or risk of bleeding: not applicable

## 2011-09-08 NOTE — Progress Notes (Signed)
Dr. Rayburn Ma notified patient continues to c/o pain rt upper arm not relieved with Percocet. Will call in Rx for Ibuprofen 800 mg and a muscle relaxer to add to pain control regimen. Pt verbalizes understanding.

## 2011-09-08 NOTE — Preoperative (Signed)
Beta Blockers   Reason not to administer Beta Blockers:Not Applicable 

## 2011-09-08 NOTE — Anesthesia Procedure Notes (Signed)
Procedure Name: Intubation Date/Time: 09/08/2011 1:17 PM Performed by: Neomia Dear, Delmi Fulfer K Pre-anesthesia Checklist: Patient identified, Timeout performed, Emergency Drugs available, Suction available and Patient being monitored Patient Re-evaluated:Patient Re-evaluated prior to inductionOxygen Delivery Method: Circle system utilized Preoxygenation: Pre-oxygenation with 100% oxygen Intubation Type: IV induction Ventilation: Mask ventilation without difficulty Laryngoscope Size: Mac and 4 Grade View: Grade II Tube type: Oral Tube size: 8.5 mm Number of attempts: 1 Airway Equipment and Method: Stylet and Patient positioned with wedge pillow Placement Confirmation: ETT inserted through vocal cords under direct vision,  breath sounds checked- equal and bilateral and positive ETCO2 Secured at: 24 cm Tube secured with: Tape Dental Injury: Teeth and Oropharynx as per pre-operative assessment

## 2011-09-08 NOTE — Discharge Instructions (Signed)
Expect swelling and bloody drainage. Ice as needed. You can remove your dressings tomorrow (3/6) and shower. You can get your incisions wet daily and place band-aids on them as needed.

## 2011-09-08 NOTE — Anesthesia Preprocedure Evaluation (Addendum)
Anesthesia Evaluation  Patient identified by MRN, date of birth, ID band Patient awake    Reviewed: Allergy & Precautions, H&P , NPO status , Patient's Chart, lab work & pertinent test results, reviewed documented beta blocker date and time   Airway Mallampati: II      Dental  (+) Teeth Intact   Pulmonary  breath sounds clear to auscultation        Cardiovascular hypertension, Pt. on home beta blockers and Pt. on medications Rhythm:Regular Rate:Normal     Neuro/Psych    GI/Hepatic GERD-  Medicated,  Endo/Other  Morbid obesity  Renal/GU      Musculoskeletal   Abdominal (+) + obese,   Peds  Hematology   Anesthesia Other Findings   Reproductive/Obstetrics                          Anesthesia Physical Anesthesia Plan  ASA: III  Anesthesia Plan: General   Post-op Pain Management:    Induction: Intravenous  Airway Management Planned: Oral ETT  Additional Equipment:   Intra-op Plan:   Post-operative Plan: Extubation in OR  Informed Consent: I have reviewed the patients History and Physical, chart, labs and discussed the procedure including the risks, benefits and alternatives for the proposed anesthesia with the patient or authorized representative who has indicated his/her understanding and acceptance.   Dental advisory given  Plan Discussed with: Anesthesiologist and Surgeon  Anesthesia Plan Comments: (Obesity Htn GERD  Plan GA, due to body habitus will avoid interscalene block.  Kipp Brood)       Anesthesia Quick Evaluation

## 2011-09-08 NOTE — Anesthesia Postprocedure Evaluation (Signed)
  Anesthesia Post-op Note  Patient: Justin Mason  Procedure(s) Performed: Procedure(s) (LRB): SHOULDER ARTHROSCOPY WITH ROTATOR CUFF REPAIR AND SUBACROMIAL DECOMPRESSION (Right)  Patient Location: PACU  Anesthesia Type: General  Level of Consciousness: awake, alert  and oriented  Airway and Oxygen Therapy: Patient Spontanous Breathing and Patient connected to nasal cannula oxygen  Post-op Pain: mild  Post-op Assessment: Post-op Vital signs reviewed and Patient's Cardiovascular Status Stable  Post-op Vital Signs: stable  Complications: No apparent anesthesia complications

## 2011-09-09 MED FILL — Morphine Sulfate Inj 4 MG/ML: INTRAMUSCULAR | Qty: 1 | Status: AC

## 2011-09-09 NOTE — Op Note (Signed)
NAME:  Justin Mason, Justin Mason NO.:  [id number]  MEDICAL RECORD NO.:  [id number]  LOCATION:  [facility]                         FACILITY:  [facility]  PHYSICIAN:  Vanita Panda. Magnus Ivan, M.D.DATE OF BIRTH:  1981-06-27  DATE OF PROCEDURE:  09/08/2011 DATE OF DISCHARGE:  09/08/2011                              OPERATIVE REPORT   PREOPERATIVE DIAGNOSIS:  Right shoulder full-thickness and retracted rotator cuff tear in a morbidly obese individual.  POSTOPERATIVE DIAGNOSIS:  Right shoulder full-thickness and retracted rotator cuff tear in a morbidly obese individual.  PROCEDURE:  Right shoulder arthroscopy and minimal debridement.  SURGEON:  Vanita Panda. Magnus Ivan, MD  ANESTHESIA: 1. General. 2. Local with 0.5% plain Marcaine and 4 mg of morphine.  BLOOD LOSS:  Minimal.  COMPLICATIONS:  None.  INDICATIONS:  Justin Mason is a 31 year old individual who is in excess of 410 pounds.  With his morbid obesity, he has also had high blood pressure. He injured his shoulder about a year ago, and a month after injury to shoulder and work-related accident, an MRI was obtained that showed a full-thickness rotator cuff tear.  He eventually has been unable to work. Apparently, Worker's Compensation did not approve him for any surgery and denied his claim.  He eventually came to me about a little over a month ago with his mom, who I had seen as a patient before.  He has a morbidly obese shoulder and it is painful to him.  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  I did let him know the risks and benefits to this as well as that how complicated this could be given his shoulder is so large and obese having a successful repair may be quite difficult given the MRI findings of his significantly retracted rotator cuff tear. He understood the risks and benefits of this and he did wish to proceed with surgery.  PROCEDURE  DESCRIPTION:  After informed consent was obtained, appropriate right shoulder was marked.  He was brought to the operating room and placed supine on the operating table.  General anesthesia was then obtained.  He was then turned into the lateral decubitus position using the beanbag, axillary roll, and then a traction device with his left arm placed in longitudinal traction with 45 degrees of forward flexion and neutral rotation.  Even with 15 pounds, his shoulder was quite heavy and it made axis in the shoulder difficult.  The shoulder was prepped and draped with DuraPrep and sterile drapes.  A time-out was called to identify correct patient and correct right shoulder.  I felt bony landmarks of his shoulder and attempted to enter the glenohumeral joint through a posterior portal.  This was quite difficult, and I believe I got into the shoulder joint one time to see that it is rotator cuff was torn, but I could not mobilize tissue or get his shoulder position enough to putting the other instruments inside the shoulder joint. I entered the subacromial space through the posterior portal and made a separate lateral portal and found extensive inflamed tissue in this space.  It was still quite difficult to even control  the instruments secondary to the obesity in this tight space.  I was able to place a soft tissue ablation wand to perform a minimal debridement.  I could not repair the rotator cuff, and I could not perform much other surgery. Again, my limitations related to his morbid obesity.  I then removed the instrumentation from the 2 portal sites and closed them with interrupted nylon suture.  I infiltrated the incisions with 0.25% plain Marcaine and morphine.  A well-padded sterile dressing was applied, and he was turned back into his supine position, awake, and extubated and taken to recovery room in stable condition.  All final counts were correct. There were no complications noted.   Postoperatively, we will treat him in a sling with discharging him to home and follow up in the office in a week.     Vanita Panda. Magnus Ivan, M.D.     CYB/MEDQ  D:  09/08/2011  T:  09/09/2011  Job:  [id number]

## 2011-09-24 ENCOUNTER — Encounter (HOSPITAL_COMMUNITY): Payer: Self-pay | Admitting: Pharmacy Technician

## 2011-09-25 ENCOUNTER — Other Ambulatory Visit (HOSPITAL_COMMUNITY): Payer: PRIVATE HEALTH INSURANCE

## 2011-09-28 ENCOUNTER — Encounter (HOSPITAL_COMMUNITY): Payer: Self-pay

## 2011-09-28 NOTE — Progress Notes (Signed)
Called Dr. Veda Canning office, left voice message for Darl Pikes reguesting preop orders.

## 2011-09-29 ENCOUNTER — Ambulatory Visit (HOSPITAL_COMMUNITY): Payer: PRIVATE HEALTH INSURANCE | Admitting: Anesthesiology

## 2011-09-29 ENCOUNTER — Ambulatory Visit (HOSPITAL_COMMUNITY)
Admission: RE | Admit: 2011-09-29 | Discharge: 2011-09-29 | Disposition: A | Discharge status: Home / Self Care | Payer: PRIVATE HEALTH INSURANCE | Source: Ambulatory Visit | Attending: Orthopedic Surgery | Admitting: Orthopedic Surgery

## 2011-09-29 ENCOUNTER — Encounter (HOSPITAL_COMMUNITY): Payer: Self-pay | Admitting: Anesthesiology

## 2011-09-29 ENCOUNTER — Encounter (HOSPITAL_COMMUNITY)
Admission: RE | Disposition: A | Discharge status: Home / Self Care | Payer: Self-pay | Source: Ambulatory Visit | Attending: Orthopedic Surgery

## 2011-09-29 ENCOUNTER — Other Ambulatory Visit: Payer: Self-pay | Admitting: Orthopedic Surgery

## 2011-09-29 DIAGNOSIS — I10 Essential (primary) hypertension: Secondary | ICD-10-CM | POA: Insufficient documentation

## 2011-09-29 DIAGNOSIS — M67919 Unspecified disorder of synovium and tendon, unspecified shoulder: Secondary | ICD-10-CM | POA: Insufficient documentation

## 2011-09-29 DIAGNOSIS — K219 Gastro-esophageal reflux disease without esophagitis: Secondary | ICD-10-CM | POA: Insufficient documentation

## 2011-09-29 DIAGNOSIS — M719 Bursopathy, unspecified: Secondary | ICD-10-CM | POA: Insufficient documentation

## 2011-09-29 LAB — CBC
HCT: 41.9 % (ref 39.0–52.0)
MCH: 29.4 pg (ref 26.0–34.0)
MCV: 85.5 fL (ref 78.0–100.0)
RDW: 12.7 % (ref 11.5–15.5)
WBC: 10.3 10*3/uL (ref 4.0–10.5)

## 2011-09-29 LAB — BASIC METABOLIC PANEL
CO2: 28 mEq/L (ref 19–32)
Chloride: 102 mEq/L (ref 96–112)
Creatinine, Ser: 1.05 mg/dL (ref 0.50–1.35)
Glucose, Bld: 121 mg/dL — ABNORMAL HIGH (ref 70–99)

## 2011-09-29 SURGERY — SHOULDER ARTHROSCOPY WITH ROTATOR CUFF REPAIR AND SUBACROMIAL DECOMPRESSION
Anesthesia: General | Site: Shoulder | Laterality: Right | Wound class: Clean

## 2011-09-29 MED ORDER — FENTANYL CITRATE 0.05 MG/ML IJ SOLN
INTRAMUSCULAR | Status: AC
  Filled 2011-09-29: qty 2

## 2011-09-29 MED ORDER — NEOSTIGMINE METHYLSULFATE 1 MG/ML IJ SOLN
INTRAMUSCULAR | Status: DC | PRN
  Administered 2011-09-29: 5 mg via INTRAVENOUS

## 2011-09-29 MED ORDER — LACTATED RINGERS IV SOLN
INTRAVENOUS | Status: DC | PRN
  Administered 2011-09-29 (×3): via INTRAVENOUS

## 2011-09-29 MED ORDER — GLYCOPYRROLATE 0.2 MG/ML IJ SOLN
INTRAMUSCULAR | Status: DC | PRN
  Administered 2011-09-29: .8 mg via INTRAVENOUS

## 2011-09-29 MED ORDER — PROPOFOL 10 MG/ML IV EMUL
INTRAVENOUS | Status: DC | PRN
  Administered 2011-09-29: 200 mg via INTRAVENOUS

## 2011-09-29 MED ORDER — LACTATED RINGERS IV SOLN
INTRAVENOUS | Status: DC
  Administered 2011-09-29: 09:00:00 via INTRAVENOUS

## 2011-09-29 MED ORDER — MIDAZOLAM HCL 5 MG/5ML IJ SOLN
INTRAMUSCULAR | Status: DC | PRN
  Administered 2011-09-29 (×2): 1 mg via INTRAVENOUS

## 2011-09-29 MED ORDER — PHENYLEPHRINE HCL 10 MG/ML IJ SOLN
INTRAMUSCULAR | Status: DC | PRN
  Administered 2011-09-29 (×2): 80 ug via INTRAVENOUS

## 2011-09-29 MED ORDER — HYDROCODONE-ACETAMINOPHEN 7.5-325 MG PO TABS
2.0000 | ORAL_TABLET | Freq: Once | ORAL | Status: AC
  Administered 2011-09-29: 2 via ORAL

## 2011-09-29 MED ORDER — FENTANYL CITRATE 0.05 MG/ML IJ SOLN
INTRAMUSCULAR | Status: DC | PRN
  Administered 2011-09-29 (×5): 50 ug via INTRAVENOUS

## 2011-09-29 MED ORDER — ROCURONIUM BROMIDE 100 MG/10ML IV SOLN
INTRAVENOUS | Status: DC | PRN
  Administered 2011-09-29 (×2): 10 mg via INTRAVENOUS
  Administered 2011-09-29: 50 mg via INTRAVENOUS
  Administered 2011-09-29: 10 mg via INTRAVENOUS
  Administered 2011-09-29: 20 mg via INTRAVENOUS

## 2011-09-29 MED ORDER — MIDAZOLAM HCL 2 MG/2ML IJ SOLN
INTRAMUSCULAR | Status: AC
  Filled 2011-09-29: qty 2

## 2011-09-29 MED ORDER — ACETAMINOPHEN 10 MG/ML IV SOLN
INTRAVENOUS | Status: AC
  Filled 2011-09-29: qty 100

## 2011-09-29 MED ORDER — SODIUM CHLORIDE 0.9 % IR SOLN
Status: DC | PRN
  Administered 2011-09-29: 7000 mL

## 2011-09-29 MED ORDER — LIDOCAINE HCL (CARDIAC) 20 MG/ML IV SOLN
INTRAVENOUS | Status: DC | PRN
  Administered 2011-09-29: 100 mg via INTRAVENOUS

## 2011-09-29 MED ORDER — MUPIROCIN 2 % EX OINT
TOPICAL_OINTMENT | CUTANEOUS | Status: AC
  Filled 2011-09-29: qty 22

## 2011-09-29 MED ORDER — CEFAZOLIN SODIUM 1-5 GM-% IV SOLN
INTRAVENOUS | Status: AC
  Filled 2011-09-29: qty 100

## 2011-09-29 MED ORDER — ONDANSETRON HCL 4 MG/2ML IJ SOLN
INTRAMUSCULAR | Status: DC | PRN
  Administered 2011-09-29: 4 mg via INTRAVENOUS

## 2011-09-29 MED ORDER — DEXTROSE 5 % IV SOLN
INTRAVENOUS | Status: DC | PRN
  Administered 2011-09-29 (×2): via INTRAVENOUS

## 2011-09-29 MED ORDER — SUCCINYLCHOLINE CHLORIDE 20 MG/ML IJ SOLN
INTRAMUSCULAR | Status: DC | PRN
  Administered 2011-09-29: 140 mg via INTRAVENOUS

## 2011-09-29 MED ORDER — HYDROMORPHONE HCL PF 1 MG/ML IJ SOLN
0.2500 mg | INTRAMUSCULAR | Status: DC | PRN
  Administered 2011-09-29 (×2): 0.5 mg via INTRAVENOUS

## 2011-09-29 MED ORDER — DEXTROSE 5 % IV SOLN: 1.0000 g | INTRAVENOUS | Status: DC | PRN

## 2011-09-29 MED ORDER — CEFAZOLIN SODIUM 1-5 GM-% IV SOLN
INTRAVENOUS | Status: DC | PRN
  Administered 2011-09-29: 2 g via INTRAVENOUS

## 2011-09-29 MED ORDER — ACETAMINOPHEN 10 MG/ML IV SOLN
INTRAVENOUS | Status: DC | PRN
  Administered 2011-09-29: 1000 mg via INTRAVENOUS

## 2011-09-29 MED ORDER — HYDROCODONE-ACETAMINOPHEN 7.5-325 MG PO TABS: 1.0000 | ORAL_TABLET | ORAL | Status: AC | PRN

## 2011-09-29 MED ORDER — DROPERIDOL 2.5 MG/ML IJ SOLN: 0.6250 mg | INTRAMUSCULAR | Status: DC | PRN

## 2011-09-29 SURGICAL SUPPLY — 73 items
8.25MM X 9CM TWIST-IN CANNULA W/ "NO SQUIRT" CAP ×2 IMPLANT
ANCHOR SUT BIO SW 4.75X19.1 (Anchor) ×4 IMPLANT
BLADE LONG MED 31X9 (MISCELLANEOUS) IMPLANT
BLADE SURG 11 STRL SS (BLADE) ×2 IMPLANT
BUR OVAL 4.0 (BURR) ×2 IMPLANT
CANISTER OMNI JUG 16 LITER (MISCELLANEOUS) IMPLANT
CANNULA 5.75X71 LONG (CANNULA) ×2 IMPLANT
CANNULA TWIST IN 8.25X7CM (CANNULA) ×2 IMPLANT
CANNULA TWIST IN 8.25X9CM (CANNULA) ×2 IMPLANT
CHLORAPREP W/TINT 26ML (MISCELLANEOUS) ×2 IMPLANT
CLOTH BEACON ORANGE TIMEOUT ST (SAFETY) ×2 IMPLANT
COVER SURGICAL LIGHT HANDLE (MISCELLANEOUS) ×2 IMPLANT
DRAPE INCISE IOBAN 66X45 STRL (DRAPES) ×2 IMPLANT
DRAPE STERI 35X30 U-POUCH (DRAPES) ×2 IMPLANT
DRAPE U-SHAPE 47X51 STRL (DRAPES) ×2 IMPLANT
DRILL BIT PANALOK RC 3.2 (BIT) IMPLANT
DRSG EMULSION OIL 3X3 NADH (GAUZE/BANDAGES/DRESSINGS) ×2 IMPLANT
DRSG PAD ABDOMINAL 8X10 ST (GAUZE/BANDAGES/DRESSINGS) IMPLANT
ELECT CAUTERY BLADE 6.4 (BLADE) IMPLANT
ELECT NEEDLE TIP 2.8 STRL (NEEDLE) IMPLANT
ELECT REM PT RETURN 9FT ADLT (ELECTROSURGICAL) ×2
ELECTRODE REM PT RTRN 9FT ADLT (ELECTROSURGICAL) ×1 IMPLANT
GLOVE BIO SURGEON STRL SZ7.5 (GLOVE) ×2 IMPLANT
GLOVE BIOGEL M 7.0 STRL (GLOVE) ×2 IMPLANT
GLOVE BIOGEL PI IND STRL 6.5 (GLOVE) ×1 IMPLANT
GLOVE BIOGEL PI IND STRL 7.0 (GLOVE) ×2 IMPLANT
GLOVE BIOGEL PI IND STRL 8 (GLOVE) ×1 IMPLANT
GLOVE BIOGEL PI INDICATOR 6.5 (GLOVE) ×1
GLOVE BIOGEL PI INDICATOR 7.0 (GLOVE) ×2
GLOVE BIOGEL PI INDICATOR 8 (GLOVE) ×1
GLOVE ECLIPSE 7.5 STRL STRAW (GLOVE) ×2 IMPLANT
GLOVE SURG SS PI 6.5 STRL IVOR (GLOVE) ×4 IMPLANT
GOWN PREVENTION PLUS XLARGE (GOWN DISPOSABLE) ×6 IMPLANT
GOWN STRL NON-REIN LRG LVL3 (GOWN DISPOSABLE) ×2 IMPLANT
KIT BASIN OR (CUSTOM PROCEDURE TRAY) ×2 IMPLANT
KIT ROOM TURNOVER OR (KITS) ×2 IMPLANT
MANIFOLD NEPTUNE II (INSTRUMENTS) ×2 IMPLANT
NDL SUT 6 .5 CRC .975X.05 MAYO (NEEDLE) IMPLANT
NEEDLE HYPO 25GX1X1/2 BEV (NEEDLE) IMPLANT
NEEDLE MAYO TAPER (NEEDLE)
NEEDLE SCORPION MULTI FIRE (NEEDLE) ×2 IMPLANT
NEEDLE SPNL 18GX3.5 QUINCKE PK (NEEDLE) ×2 IMPLANT
NS IRRIG 1000ML POUR BTL (IV SOLUTION) ×2 IMPLANT
PACK SHOULDER (CUSTOM PROCEDURE TRAY) ×2 IMPLANT
PAD ARMBOARD 7.5X6 YLW CONV (MISCELLANEOUS) ×4 IMPLANT
RESECTOR FULL RADIUS 4.2MM (BLADE) ×2 IMPLANT
SET ARTHROSCOPY TUBING (MISCELLANEOUS) ×1
SET ARTHROSCOPY TUBING LN (MISCELLANEOUS) ×1 IMPLANT
SLING ARM FOAM STRAP LRG (SOFTGOODS) IMPLANT
SLING ARM FOAM STRAP MED (SOFTGOODS) IMPLANT
SLING ARM IMMOBILIZER XL (CAST SUPPLIES) ×2 IMPLANT
SPONGE GAUZE 4X4 12PLY (GAUZE/BANDAGES/DRESSINGS) IMPLANT
SPONGE LAP 4X18 X RAY DECT (DISPOSABLE) IMPLANT
SUCTION FRAZIER TIP 10 FR DISP (SUCTIONS) ×2 IMPLANT
SUPPORT WRAP ARM LG (MISCELLANEOUS) IMPLANT
SUT BONE WAX W31G (SUTURE) IMPLANT
SUT ETHIBOND 2 OS 4 DA (SUTURE) IMPLANT
SUT ETHIBOND NAB BRD #0 18IN (SUTURE) IMPLANT
SUT ETHILON 3 0 PS 1 (SUTURE) ×2 IMPLANT
SUT FIBERWIRE #2 38 T-5 BLUE (SUTURE) ×2
SUT VIC AB 0 CT2 27 (SUTURE) ×2 IMPLANT
SUT VIC AB 2-0 CT1 27 (SUTURE) ×1
SUT VIC AB 2-0 CT1 TAPERPNT 27 (SUTURE) ×1 IMPLANT
SUT VIC AB 2-0 FS1 27 (SUTURE) IMPLANT
SUTURE FIBERWR #2 38 T-5 BLUE (SUTURE) ×1 IMPLANT
SYR CONTROL 10ML LL (SYRINGE) IMPLANT
TAPE FIBER 2MM 7IN #2 BLUE (SUTURE) ×2 IMPLANT
TOWEL OR 17X24 6PK STRL BLUE (TOWEL DISPOSABLE) ×2 IMPLANT
TOWEL OR 17X26 10 PK STRL BLUE (TOWEL DISPOSABLE) ×2 IMPLANT
TUBE CONNECTING 12X1/4 (SUCTIONS) ×2 IMPLANT
TUBING ARTHROSCOPY IRRIG 16FT (MISCELLANEOUS) ×2 IMPLANT
WAND 90 DEG TURBOVAC W/CORD (SURGICAL WAND) ×2 IMPLANT
WATER STERILE IRR 1000ML POUR (IV SOLUTION) ×2 IMPLANT

## 2011-09-29 NOTE — Op Note (Signed)
Procedure(s): SHOULDER ARTHROSCOPY WITH ROTATOR CUFF REPAIR AND SUBACROMIAL DECOMPRESSION Procedure Note  Justin Mason male 31 y.o. 09/29/2011  Procedure(s) and Anesthesia Type:    * SHOULDER ARTHROSCOPY WITH ROTATOR CUFF REPAIR - General  Surgeon(s) and Role:    * Mable Paris, MD - Primary     Surgeon: Mable Paris   Assistants: Skip Mayer PA-C (Blair's assistance was necessary during the case due to the extremely large size of the patient.)  Anesthesia: General endotracheal anesthesia    Procedure Detail  SHOULDER ARTHROSCOPY WITH ROTATOR CUFF REPAIR  Estimated Blood Loss: Min         Drains: none  Blood Given: none         Specimens: none        Complications:  * No complications entered in OR log *         Disposition: PACU - hemodynamically stable.         Condition: stable    Procedure:   INDICATIONS FOR SURGERY: The patient is 31 y.o. morbidly obese male who injured his right shoulder approximately one year ago. He had an MRI which revealed full thickness rotator cuff tears of the supra-and infraspinatus. He ultimately went on to have an arthroscopic procedure with Dr. Magnus Ivan about 3 weeks ago, but the rotator cuff was unable to be repaired at that time. He was referred to me for possible arthroscopic rotator cuff repair. Given his morbid obesity he knew that this would be a very challenging case and may require an open procedure, and given the chronicity of the tear may not be repairable at all. Given his very young age we certainly felt that it was important to try. He understood the risks of surgery including but not limited to risk of bleeding infection damage to neurovascular structures risk of nonhealing or re\re tear and the risk of stiffness. He understood and wished to go forward with surgery.  OPERATIVE FINDINGS:  The patient is morbidly obese, weighing greater than 400 pounds. This made the surgery much more difficult and  complex than a standard rotator cuff repair. Examination under anesthesia: No stiffness or instability. Diagnostic Arthroscopy:  Glenoid articular cartilage: Intact Humeral head articular cartilage: Intact Labrum: Intact Loose bodies: None Synovitis: Mild  rotator cuff: He had retracted tears of the supraspinatus and infraspinatus. The subscapularis was intact. After extensive mobilization I was able to repair the supraspinatus and infraspinatus back to the prepared tuberosity under moderate tension. I did have to extend the tuberosity surface a few millimeters into the articular surface in order to get the repair. The final repair was carried out with 2 swivel lock anchors with a total of 2 fiber tapes and 1 FiberWire.  DESCRIPTION OF PROCEDURE: The patient was identified in preoperative  holding area where I personally marked the operative site after  verifying site, side, and procedure with the patient. He was offered an interscalene block but declined it. The patient was taken back to the operating room where general anesthesia was induced without complication and was placed in the beach-chair position with the back  elevated about 60 degrees and all extremities and head and neck carefully padded and  positioned.   The right upper extremity was then prepped and  draped in a standard sterile fashion. The appropriate time-out  procedure was carried out. The patient did receive IV antibiotics  within 30 minutes of incision.   A small posterior portal incision was made and the arthroscope was introduced into  the joint. An anterior portal was then established above the subscapularis using needle localization. Small cannula was placed anteriorly. Diagnostic arthroscopy was then carried out with findings as described above.  The capsular release beneath the cuff edge over the superior labrum was begun in the joint from the anterior portal using the ArthriCare.  The arthroscope was then  introduced into the subacromial space a standard lateral portal was established with needle localization. The shaver was used through the lateral portal to perform extensive bursectomy. The cuff edge was exposed anterior to posterior. The teres minor insertion was identified and intact. A grasper was used from the lateral portal to check the mobilization and mobility of the tendon. It was very immobile. Accessory anterior and accessory  posterior lateral portals were made to assist in the mobilization of the tendon. A traction stitch was placed in the superficial leaflet and then the deep leaflet of the tendon to help mobilize it. I then spent about 45 minutes performing extensive releases both underneath the tendon, performing a complete capsular release, and above the tendon in the subacromial space. After this mobilization I was able to bring the tendon edge over to the very edge of the tuberosity. Therefore I felt the repair would be possible. I then used a bur from the lateral portal to extend the tuberosity surface onto the articular surface a few millimeters to create a larger potential healing surface. I then placed a fiber tape in a inverted mattress configuration through the infraspinatus just above the previously placed traction stitch that was in the deep delaminated layer. All 4 of these sutures were then advanced laterally into a 4.75 mm biocomposite swivel lock anchor, bringing the tendon nicely over to the prepared tuberosity. This effectively reduced the anterior supraspinatus tendon as well. A second fiber tape was placed through the supraspinatus tendon and placed into a second 4.75 mm swivel lock anchor anteriorly, bringing the supraspinatus down to the prepared tuberosity. At this point I felt that the tendon repair was complete and no further anchors were necessary, or possible. With gentle internal and external rotation the repair held nicely and was felt to be a strong repair.  Given the  large nature of the tear and concern for a nonhealing or retear, I did not violate the coracoacromial ligament.  The arthroscopic equipment was removed from the joint and the portals were closed with 3-0 nylon in an interrupted fashion. Sterile dressings were then applied including Xeroform 4 x 4's ABDs and tape. The patient was then allowed to awaken from general anesthesia, placed in a sling, transferred to the stretcher and taken to the recovery room in stable condition.   POSTOPERATIVE PLAN: The patient will be discharged home today and will followup in one week for suture removal and wound check.  He will remain in a sling for 6 weeks postoperatively then will advance to gentle passive exercises for 6 weeks.

## 2011-09-29 NOTE — Preoperative (Signed)
Beta Blockers   Reason not to administer Beta Blockers:Beta Blocker taken this am 

## 2011-09-29 NOTE — Progress Notes (Signed)
Pt requested to have pain medication before discharge. Dr. Ave Filter notified of this and gave orders to give pt two 7.5 mg Norco before discharge. Will administer as ordered.

## 2011-09-29 NOTE — Anesthesia Procedure Notes (Signed)
Procedure Name: Intubation Date/Time: 09/29/2011 11:17 AM Performed by: Malachi Pro Pre-anesthesia Checklist: Patient identified, Emergency Drugs available, Suction available, Patient being monitored and Timeout performed Patient Re-evaluated:Patient Re-evaluated prior to inductionOxygen Delivery Method: Circle system utilized Preoxygenation: Pre-oxygenation with 100% oxygen Intubation Type: IV induction and Rapid sequence Laryngoscope Size: Mac and 4 Grade View: Grade II Tube type: Oral Number of attempts: 1 Airway Equipment and Method: Stylet and Patient positioned with wedge pillow Placement Confirmation: ETT inserted through vocal cords under direct vision,  positive ETCO2 and breath sounds checked- equal and bilateral Secured at: 22 cm Tube secured with: Tape Dental Injury: Teeth and Oropharynx as per pre-operative assessment

## 2011-09-29 NOTE — Anesthesia Preprocedure Evaluation (Addendum)
Anesthesia Evaluation  Patient identified by MRN, date of birth, ID band Patient awake    Reviewed: Allergy & Precautions, H&P , NPO status , Patient's Chart, lab work & pertinent test results, reviewed documented beta blocker date and time   History of Anesthesia Complications Negative for: history of anesthetic complications  Airway Mallampati: II TM Distance: >3 FB Neck ROM: Full    Dental  (+) Poor Dentition and Dental Advisory Given   Pulmonary neg pulmonary ROS,    Pulmonary exam normal       Cardiovascular hypertension, Pt. on home beta blockers     Neuro/Psych negative neurological ROS     GI/Hepatic Neg liver ROS, GERD-  Controlled,  Endo/Other  Morbid obesity  Renal/GU negative Renal ROS     Musculoskeletal negative musculoskeletal ROS (+)   Abdominal   Peds  Hematology   Anesthesia Other Findings   Reproductive/Obstetrics                           Anesthesia Physical Anesthesia Plan  ASA: III  Anesthesia Plan: General   Post-op Pain Management:    Induction: Intravenous  Airway Management Planned: Oral ETT  Additional Equipment:   Intra-op Plan:   Post-operative Plan: Extubation in OR  Informed Consent: I have reviewed the patients History and Physical, chart, labs and discussed the procedure including the risks, benefits and alternatives for the proposed anesthesia with the patient or authorized representative who has indicated his/her understanding and acceptance.   Dental advisory given  Plan Discussed with:   Anesthesia Plan Comments: (Pt refuses ISB.  Did not have one during last surgery and does not feel it will be helpful this time. )        Anesthesia Quick Evaluation

## 2011-09-29 NOTE — Transfer of Care (Signed)
Immediate Anesthesia Transfer of Care Note  Patient: Justin Mason  Procedure(s) Performed: Procedure(s) (LRB): SHOULDER ARTHROSCOPY WITH ROTATOR CUFF REPAIR AND SUBACROMIAL DECOMPRESSION (Right)  Patient Location: PACU  Anesthesia Type: General  Level of Consciousness: awake, oriented and patient cooperative  Airway & Oxygen Therapy: Patient Spontanous Breathing and Patient connected to face mask oxygen  Post-op Assessment: Report given to PACU RN, Post -op Vital signs reviewed and stable and Patient moving all extremities X 4  Post vital signs: Reviewed and stable  Complications: No apparent anesthesia complications

## 2011-09-29 NOTE — Anesthesia Postprocedure Evaluation (Signed)
  Anesthesia Post-op Note  Patient: Justin Mason  Procedure(s) Performed: Procedure(s) (LRB): SHOULDER ARTHROSCOPY WITH ROTATOR CUFF REPAIR AND SUBACROMIAL DECOMPRESSION (Right)  Patient Location: PACU  Anesthesia Type: General  Level of Consciousness: awake, alert , oriented and patient cooperative  Airway and Oxygen Therapy: Patient Spontanous Breathing and Patient connected to nasal cannula oxygen  Post-op Pain: mild  Post-op Assessment: Post-op Vital signs reviewed, Patient's Cardiovascular Status Stable, Respiratory Function Stable, Patent Airway, No signs of Nausea or vomiting and Pain level controlled  Post-op Vital Signs: stable  Complications: No apparent anesthesia complications

## 2011-09-29 NOTE — H&P (Signed)
Justin Mason is an 31 y.o. male.   Chief Complaint: R shoulder weakness HPI: 31 yo male with injury to R shoulder  1 year ago.  Attempted RCR within last month, which was technically not possible due to size.    Past Medical History  Diagnosis Date  . Hypertension   . GERD (gastroesophageal reflux disease)     Past Surgical History  Procedure Date  . Incision and drainage foot 1991-1992    STEPPED ON NAIL / INFECTED WOUND  . Shoulder surgery     September 08, 2011    Family History  Problem Relation Age of Onset  . Anesthesia problems Sister    Social History:  reports that he has never smoked. His smokeless tobacco use includes Snuff. He reports that he drinks about 4.2 ounces of alcohol per week. He reports that he does not use illicit drugs.  Allergies: No Known Allergies  Medications Prior to Admission  Medication Dose Route Frequency Provider Last Rate Last Dose  . lactated ringers infusion   Intravenous Continuous Remonia Richter, MD 50 mL/hr at 09/29/11 0914    . DISCONTD: mupirocin ointment (BACTROBAN) 2 %            Medications Prior to Admission  Medication Sig Dispense Refill  . atenolol (TENORMIN) 100 MG tablet Take 100 mg by mouth daily.      Marland Kitchen GARLIC PO Take 1 tablet by mouth daily.      Marland Kitchen ibuprofen (ADVIL,MOTRIN) 800 MG tablet Take 800 mg by mouth every 8 (eight) hours as needed.      . lansoprazole (PREVACID) 30 MG capsule Take 30 mg by mouth daily.      Marland Kitchen lisinopril-hydrochlorothiazide (PRINZIDE,ZESTORETIC) 20-25 MG per tablet Take 0.5 tablets by mouth daily.      . naproxen sodium (ANAPROX) 220 MG tablet Take 220 mg by mouth 2 (two) times daily as needed. Headache/pain        Results for orders placed during the hospital encounter of 09/29/11 (from the past 48 hour(s))  BASIC METABOLIC PANEL     Status: Abnormal   Collection Time   09/29/11  7:59 AM      Component Value Range Comment   Sodium 138  135 - 145 (mEq/L)    Potassium 3.9  3.5 - 5.1 (mEq/L)      Chloride 102  96 - 112 (mEq/L)    CO2 28  19 - 32 (mEq/L)    Glucose, Bld 121 (*) 70 - 99 (mg/dL)    BUN 10  6 - 23 (mg/dL)    Creatinine, Ser 1.61  0.50 - 1.35 (mg/dL)    Calcium 9.7  8.4 - 10.5 (mg/dL)    GFR calc non Af Amer >90  >90 (mL/min)    GFR calc Af Amer >90  >90 (mL/min)   CBC     Status: Normal   Collection Time   09/29/11  7:59 AM      Component Value Range Comment   WBC 10.3  4.0 - 10.5 (K/uL)    RBC 4.90  4.22 - 5.81 (MIL/uL)    Hemoglobin 14.4  13.0 - 17.0 (g/dL)    HCT 09.6  04.5 - 40.9 (%)    MCV 85.5  78.0 - 100.0 (fL)    MCH 29.4  26.0 - 34.0 (pg)    MCHC 34.4  30.0 - 36.0 (g/dL)    RDW 81.1  91.4 - 78.2 (%)    Platelets 188  150 -  400 (K/uL)    No results found.  Review of Systems  All other systems reviewed and are negative.    Blood pressure 149/93, pulse 71, temperature 98.2 F (36.8 C), temperature source Oral, resp. rate 18, height 6\' 1"  (1.854 m), weight 185.975 kg (410 lb), SpO2 98.00%. Physical Exam  Constitutional: He is oriented to person, place, and time. He appears well-developed and well-nourished.       Morbidly obese  HENT:  Head: Normocephalic and atraumatic.  Eyes: EOM are normal.  Cardiovascular: Intact distal pulses.   Respiratory: Effort normal.  Musculoskeletal:       Right shoulder: He exhibits decreased range of motion, tenderness, pain and decreased strength.  Neurological: He is alert and oriented to person, place, and time.  Skin: Skin is warm and dry.     Assessment/Plan R full thickness RCT in morbidly obese patient Will attempt arthroscopic repair in beach chair position, possible open repair Risks / benefits of surgery discussed Consent on chart  NPO for OR Preop antibiotics   Alzada Brazee WILLIAM 09/29/2011, 9:56 AM

## 2011-09-29 NOTE — Discharge Instructions (Signed)
Discharge Instructions after Arthroscopic Shoulder Repair   A sling has been provided for you. Remain in your sling at all times. This includes sleeping in your sling.  Use ice on the shoulder intermittently over the first 48 hours after surgery.  Pain medicine has been prescribed for you.  Use your medicine liberally over the first 48 hours, and then you can begin to taper your use. You may take Extra Strength Tylenol or Tylenol only in place of the pain pills. DO NOT take ANY nonsteroidal anti-inflammatory pain medications: Advil, Motrin, Ibuprofen, Aleve, Naproxen, or Narprosyn.  You may remove your dressing after two days. If the incision sites are still moist, place a Band-Aid over the moist site(s). Change Band-Aids daily until dry.  You may shower 5 days after surgery. The incisions CANNOT get wet prior to 5 days. Simply allow the water to wash over the site and then pat dry. Do not rub the incisions. Make sure your axilla (armpit) is completely dry after showering.  Take one aspirin a day for 2 weeks after surgery, unless you have an aspirin sensitivity/ allergy or asthma.   Please call [phone] during normal business hours or [phone] after hours for any problems. Including the following:  - excessive redness of the incisions - drainage for more than 4 days - fever of more than 101.5 F  *Please note that pain medications will not be refilled after hours or on weekends.   Instructions Following General Anesthetic, Adult A nurse specialized in giving anesthesia (anesthetist) or a doctor specialized in giving anesthesia (anesthesiologist) gave you a medicine that made you sleep while a procedure was performed. For as long as 24 hours following this procedure, you may feel:  Dizzy.   Weak.   Drowsy.  AFTER THE PROCEDURE After surgery, you will be taken to the recovery area where a nurse will monitor your progress. You will be allowed to go home when you are awake, stable,  taking fluids well, and without complications. For the first 24 hours following an anesthetic:  Have a responsible person with you.   Do not drive a car. If you are alone, do not take public transportation.   Do not drink alcohol.   Do not take medicine that has not been prescribed by your caregiver.   Do not sign important papers or make important decisions.   You may resume normal diet and activities as directed.   Change bandages (dressings) as directed.   Only take over-the-counter or prescription medicines for pain, discomfort, or fever as directed by your caregiver.  If you have questions or problems that seem related to the anesthetic, call the hospital and ask for the anesthetist or anesthesiologist on call. SEEK IMMEDIATE MEDICAL CARE IF:   You develop a rash.   You have difficulty breathing.   You have chest pain.   You develop any allergic problems.  Document Released: 09/28/2000 Document Revised: 06/11/2011 Document Reviewed: 05/09/2007 [facility] Patient Information 2012 [place], [place].

## 2012-04-15 ENCOUNTER — Encounter (HOSPITAL_COMMUNITY): Payer: Self-pay

## 2012-10-17 ENCOUNTER — Ambulatory Visit
Admission: RE | Admit: 2012-10-17 | Discharge: 2012-10-17 | Disposition: A | Discharge status: Home / Self Care | Payer: No Typology Code available for payment source | Source: Ambulatory Visit | Attending: Family Medicine | Admitting: Family Medicine

## 2012-10-17 ENCOUNTER — Other Ambulatory Visit: Payer: Self-pay | Admitting: Family Medicine

## 2012-10-17 DIAGNOSIS — M79604 Pain in right leg: Secondary | ICD-10-CM

## 2012-10-17 DIAGNOSIS — M7989 Other specified soft tissue disorders: Secondary | ICD-10-CM

## 2012-10-17 IMAGING — US US EXTREM LOW VENOUS*R*
1 series · 14 of 24 positions shown · non-contrast
Comparison: None

CLINICAL DATA: Pain, swelling and redness right leg

RIGHT LOWER EXTREMITY VENOUS DUPLEX ULTRASOUND
TECHNIQUE: Gray-scale sonography with graded compression, as well
as color Doppler and duplex ultrasound, were performed to evaluate
the deep venous system of the lower extremity from the level of the
common femoral vein through the popliteal and proximal calf veins.
Spectral Doppler was utilized to evaluate flow at rest and with
distal augmentation maneuvers.

[Series 1: us extrem low venous*right* · 14 of 47 slices shown]
[im 1/47]
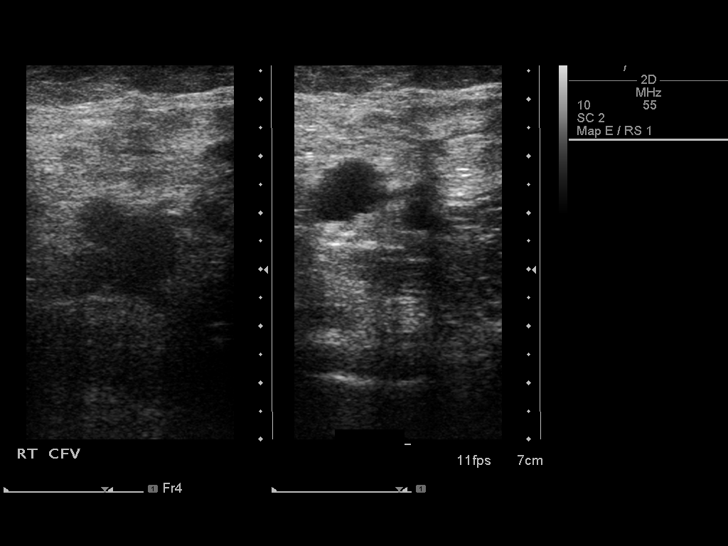
[im 5/47]
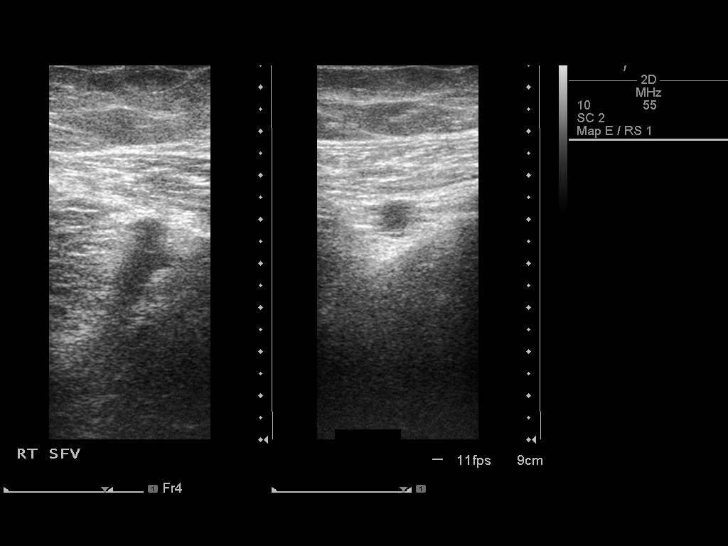
[im 9/47]
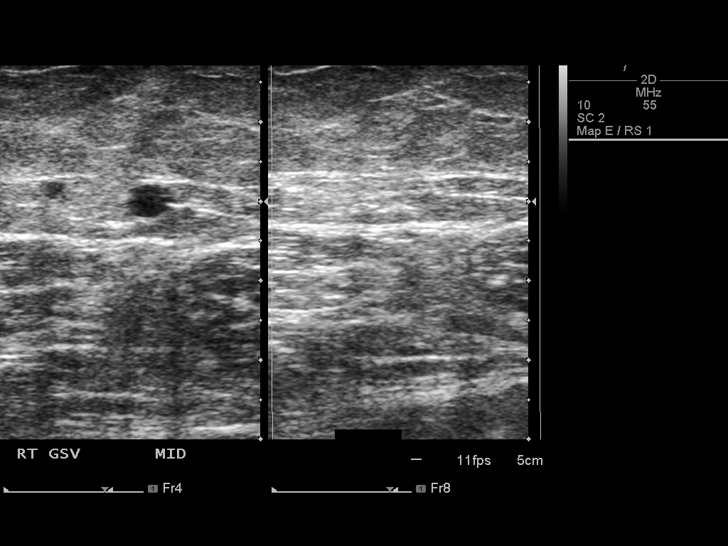
[im 13/47]
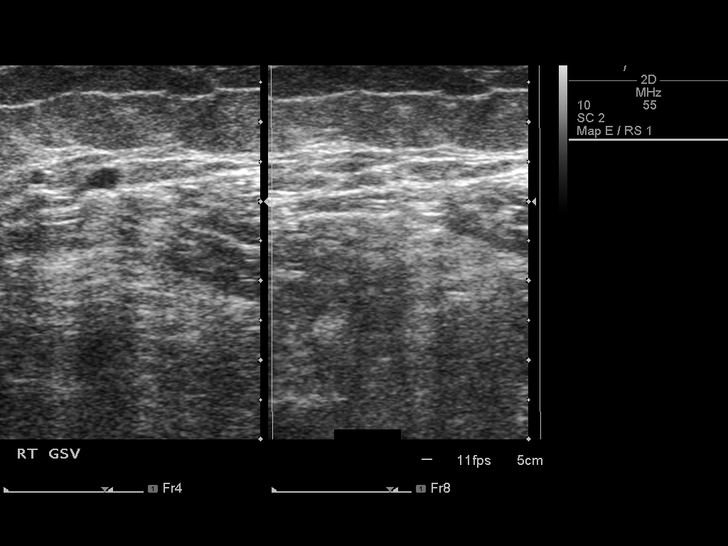
[im 15/47]
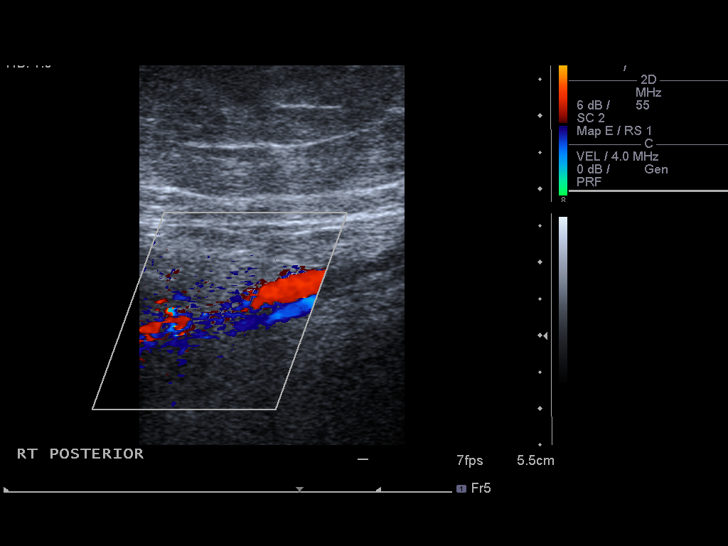
[im 19/47]
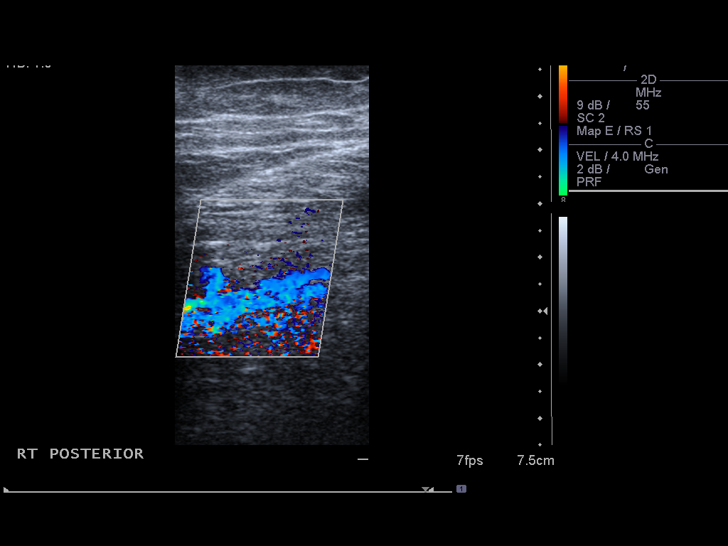
[im 23/47]
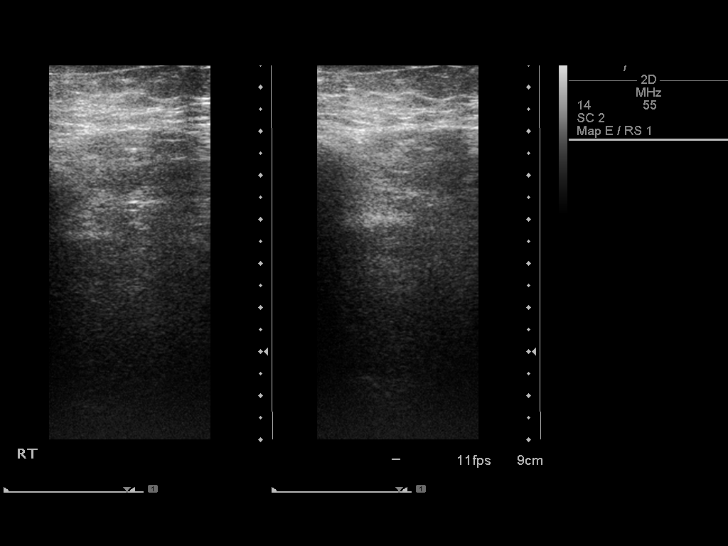
[im 25/47]
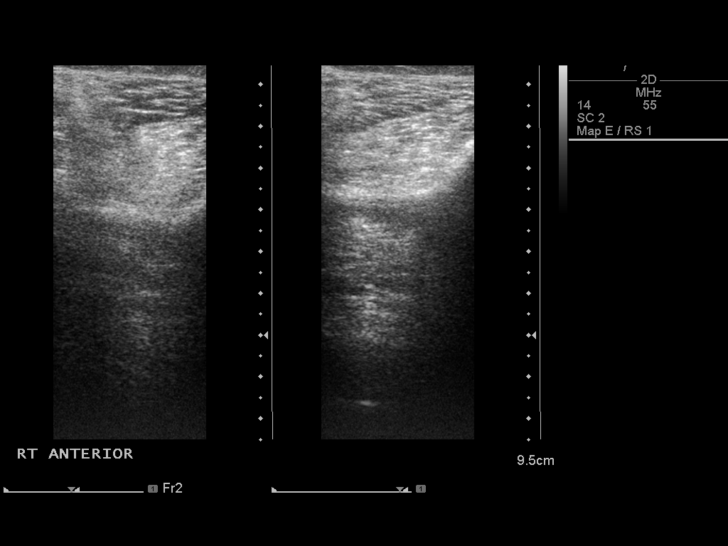
[im 29/47]
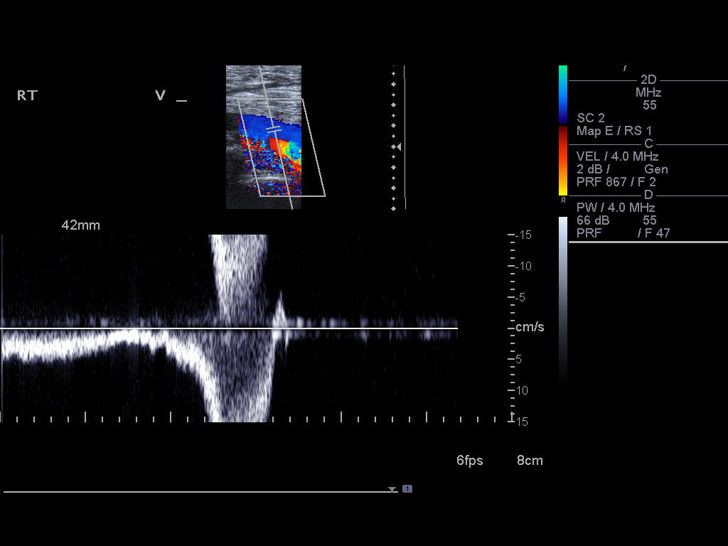
[im 33/47]
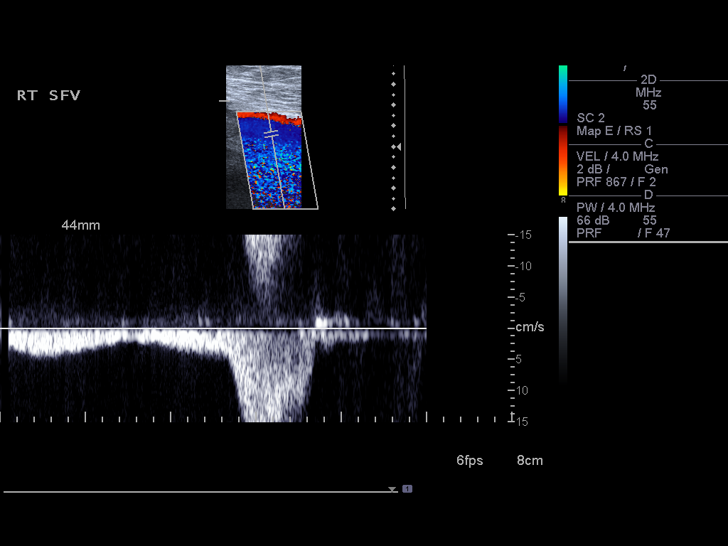
[im 37/47]
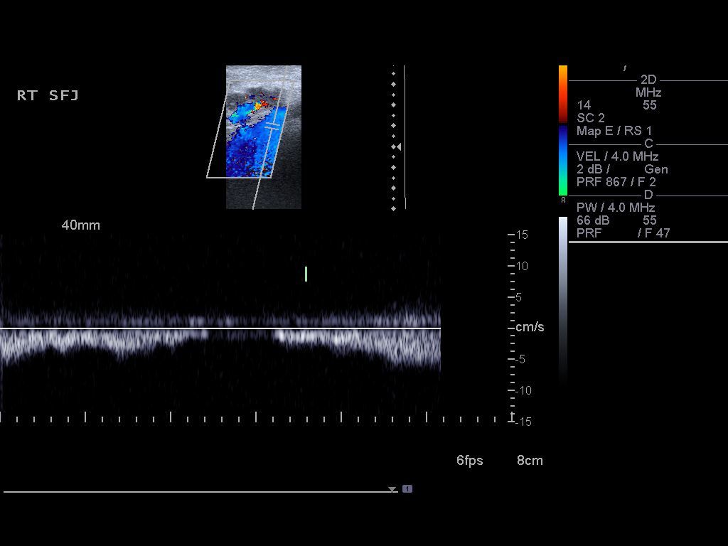
[im 39/47]
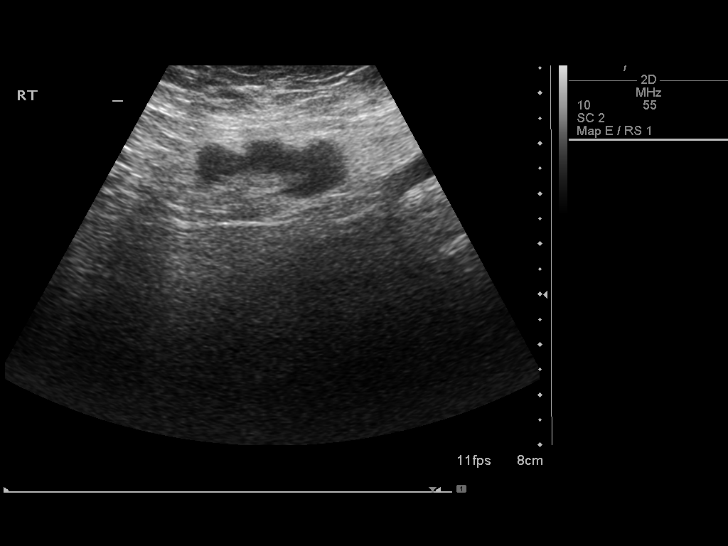
[im 43/47]
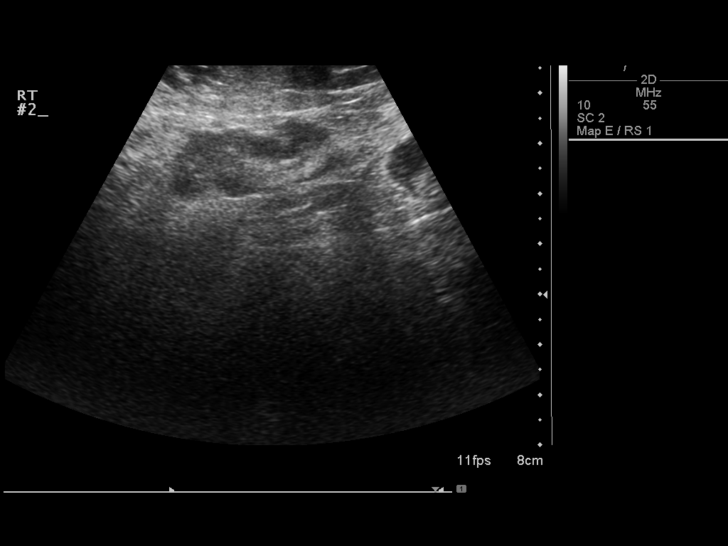
[im 47/47]
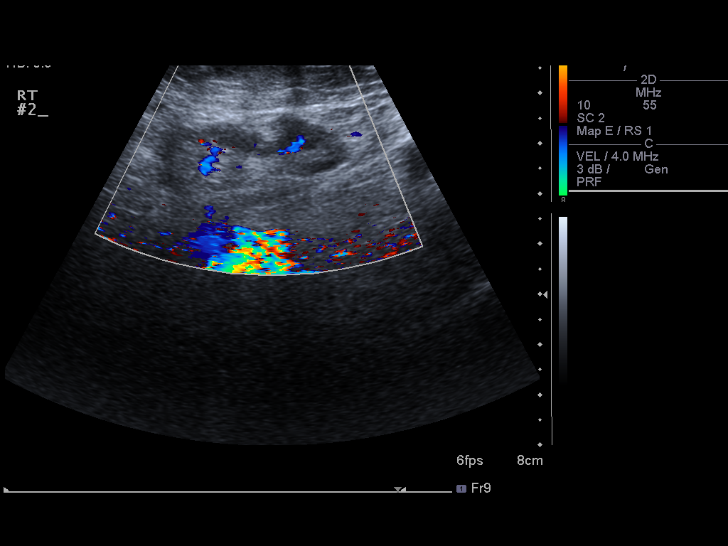

[14 of 24 positions shown; findings below may reference images not displayed]

FINDINGS: Examination limited by body habitus.
Limited visualization of calf veins.
Visualized deep venous system of the right lower extremity is
patent and compressible from groin to popliteal fossa.
Spontaneous venous flow is present with intact augmentation and
evidence of respiratory phasicity.
No intraluminal thrombus identified.
Visualized portion of the greater saphenous system appears patent.
IMPRESSION: No evidence of deep venous thrombosis within the right lower
extremity as above.

## 2013-05-04 ENCOUNTER — Ambulatory Visit (HOSPITAL_BASED_OUTPATIENT_CLINIC_OR_DEPARTMENT_OTHER): Payer: 59 | Attending: Family Medicine

## 2013-05-04 VITALS — Ht 73.0 in | Wt >= 6400 oz

## 2013-05-04 DIAGNOSIS — G4733 Obstructive sleep apnea (adult) (pediatric): Secondary | ICD-10-CM

## 2013-05-06 DIAGNOSIS — G4733 Obstructive sleep apnea (adult) (pediatric): Secondary | ICD-10-CM

## 2013-05-07 NOTE — Procedures (Signed)
NAME:  Justin Mason, Justin Mason NO.:  [id number]  MEDICAL RECORD NO.:  [id number]          PATIENT TYPE:  OUT  LOCATION:  SLEEP CENTER                 FACILITY:  [facility]  PHYSICIAN:  Makinzi Prieur D. Maple Hudson, MD, FCCP, FACPDATE OF BIRTH:  03/08/81  DATE OF STUDY:  05/04/2013                           NOCTURNAL POLYSOMNOGRAM  REFERRING PHYSICIAN:  Windle Guard, M.D.  INDICATION FOR STUDY:  Hypersomnia with sleep apnea.  EPWORTH SLEEPINESS SCORE:  5/24.  BMI 53.  Weight 400 pounds, height 71 inches, neck 18 inches.  MEDICATIONS:  Home medications are charted as "NA."  SLEEP ARCHITECTURE:  Total sleep time 241 minutes with sleep efficiency 62.4%.  Stage I was 4.1%, stage II was 51.2%, stage III absent, REM 44.6% of total sleep time.  Sleep latency 84 minutes.  REM latency 55.5 minutes.  Awake after sleep onset 14.5 minutes, arousal index 1.7. Sleep onset was shortly before midnight.  He awoke spontaneously at 4 a.m. and did not return to sleep.  No bedtime medication.  RESPIRATORY DATA:  Apnea-hypopnea index (AHI) 20.9 per hour.  A total of 84 events was scored including 49 obstructive apneas and 35 hypopneas. Events were associated with supine sleep position.  REM AHI 46.3 per hour.  There were not enough early events and sleep to meet criteria for application of split protocol CPAP titration on this study.  OXYGEN DATA:  Extremely loud snoring with oxygen desaturation to a nadir of 65% and mean oxygen saturation through the study of 92.5% on room air.  CARDIAC DATA:  Sinus rhythm with PVCs.  MOVEMENT-PARASOMNIA:  No significant movement disturbance.  No bathroom trips.  IMPRESSIONS-RECOMMENDATIONS: 1. Moderate obstructive sleep apnea/hypopnea syndrome, AHI 20.9 per     hour with supine events.  REM AHI 46.3 per hour.  Extremely loud     snoring with oxygen desaturation to a nadir of 65% and mean oxygen     saturation through the study of 92.5% on room air. 2. There  were not enough early sleep and respiratory events to permit     application of split protocol CPAP titration on this study.     Consider return for dedicated CPAP titration study if appropriate.     Zorana Brockwell D. Maple Hudson, MD, [facility], FACP Diplomate, American Board of Sleep Medicine    CDY/MEDQ  D:  05/06/2013 11:05:17  T:  05/07/2013 00:53:36  Job:  [id number]

## 2013-06-16 ENCOUNTER — Emergency Department: Payer: Self-pay | Admitting: Emergency Medicine

## 2013-07-14 ENCOUNTER — Institutional Professional Consult (permissible substitution): Payer: 59 | Admitting: Internal Medicine

## 2013-08-07 ENCOUNTER — Encounter (HOSPITAL_COMMUNITY): Payer: Self-pay | Admitting: Emergency Medicine

## 2013-08-07 ENCOUNTER — Emergency Department (HOSPITAL_COMMUNITY): Payer: 59

## 2013-08-07 ENCOUNTER — Emergency Department (HOSPITAL_COMMUNITY)
Admission: EM | Admit: 2013-08-07 | Discharge: 2013-08-07 | Disposition: A | Discharge status: Home / Self Care | Payer: 59 | Attending: Emergency Medicine | Admitting: Emergency Medicine

## 2013-08-07 DIAGNOSIS — N23 Unspecified renal colic: Secondary | ICD-10-CM | POA: Insufficient documentation

## 2013-08-07 DIAGNOSIS — I10 Essential (primary) hypertension: Secondary | ICD-10-CM | POA: Insufficient documentation

## 2013-08-07 DIAGNOSIS — K219 Gastro-esophageal reflux disease without esophagitis: Secondary | ICD-10-CM | POA: Insufficient documentation

## 2013-08-07 DIAGNOSIS — Z79899 Other long term (current) drug therapy: Secondary | ICD-10-CM | POA: Insufficient documentation

## 2013-08-07 DIAGNOSIS — R112 Nausea with vomiting, unspecified: Secondary | ICD-10-CM | POA: Insufficient documentation

## 2013-08-07 LAB — CBC WITH DIFFERENTIAL/PLATELET
Basophils Absolute: 0 10*3/uL (ref 0.0–0.1)
Basophils Relative: 0 % (ref 0–1)
Eosinophils Absolute: 0.1 10*3/uL (ref 0.0–0.7)
Eosinophils Relative: 1 % (ref 0–5)
HEMATOCRIT: 43.7 % (ref 39.0–52.0)
Hemoglobin: 15.6 g/dL (ref 13.0–17.0)
LYMPHS ABS: 1.4 10*3/uL (ref 0.7–4.0)
LYMPHS PCT: 13 % (ref 12–46)
MCH: 30.2 pg (ref 26.0–34.0)
MCHC: 35.7 g/dL (ref 30.0–36.0)
MCV: 84.7 fL (ref 78.0–100.0)
MONO ABS: 0.7 10*3/uL (ref 0.1–1.0)
MONOS PCT: 7 % (ref 3–12)
Neutro Abs: 8.6 10*3/uL — ABNORMAL HIGH (ref 1.7–7.7)
Neutrophils Relative %: 80 % — ABNORMAL HIGH (ref 43–77)
Platelets: 182 10*3/uL (ref 150–400)
RBC: 5.16 MIL/uL (ref 4.22–5.81)
RDW: 12.2 % (ref 11.5–15.5)
WBC: 10.7 10*3/uL — AB (ref 4.0–10.5)

## 2013-08-07 LAB — COMPREHENSIVE METABOLIC PANEL
ALT: 31 U/L (ref 0–53)
AST: 25 U/L (ref 0–37)
Albumin: 3.9 g/dL (ref 3.5–5.2)
Alkaline Phosphatase: 65 U/L (ref 39–117)
BUN: 15 mg/dL (ref 6–23)
CALCIUM: 9.4 mg/dL (ref 8.4–10.5)
CO2: 24 meq/L (ref 19–32)
CREATININE: 1.3 mg/dL (ref 0.50–1.35)
Chloride: 103 mEq/L (ref 96–112)
GFR calc Af Amer: 83 mL/min — ABNORMAL LOW (ref >90)
GFR, EST NON AFRICAN AMERICAN: 71 mL/min — AB (ref >90)
GLUCOSE: 110 mg/dL — AB (ref 70–99)
Potassium: 3.9 mEq/L (ref 3.7–5.3)
Sodium: 142 mEq/L (ref 137–147)
Total Bilirubin: 0.5 mg/dL (ref 0.3–1.2)
Total Protein: 7.4 g/dL (ref 6.0–8.3)

## 2013-08-07 LAB — LIPASE, BLOOD: Lipase: 27 U/L (ref 11–59)

## 2013-08-07 LAB — URINE MICROSCOPIC-ADD ON

## 2013-08-07 LAB — URINALYSIS, ROUTINE W REFLEX MICROSCOPIC
Bilirubin Urine: NEGATIVE
Glucose, UA: NEGATIVE mg/dL
Ketones, ur: NEGATIVE mg/dL
Leukocytes, UA: NEGATIVE
Nitrite: NEGATIVE
PROTEIN: NEGATIVE mg/dL
Specific Gravity, Urine: 1.026 (ref 1.005–1.030)
UROBILINOGEN UA: 0.2 mg/dL (ref 0.0–1.0)
pH: 5.5 (ref 5.0–8.0)

## 2013-08-07 IMAGING — CT CT ABD-PELV W/ CM
2 of 4 series · 17 of 46 positions shown, 19 images · IV contrast (CONTRAST)
Comparison: [DATE]

CLINICAL DATA: Right-sided abdominal pain

EXAM:
CT ABDOMEN AND PELVIS WITH CONTRAST
TECHNIQUE: Multidetector CT imaging of the abdomen and pelvis was performed
using the standard protocol following bolus administration of
intravenous contrast.
CONTRAST:  100 mL Omnipaque 300.

[Series 3: routine · axial · 0.86mm/px · z∈[+14,+514]mm · 14 of 110 slices shown, 16 images]
[im 5/110  soft-tissue]
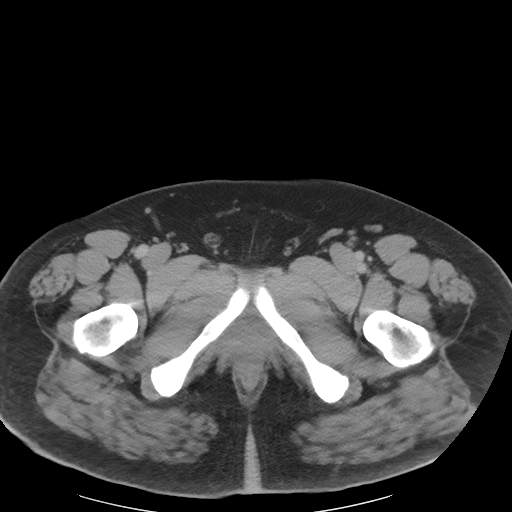
[im 5/110  bone]
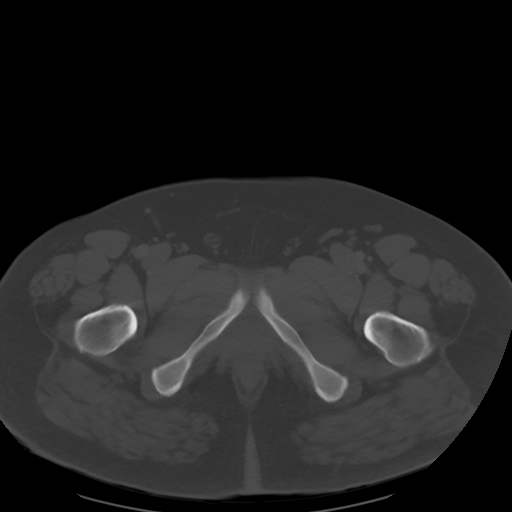
[im 15/110  soft-tissue]
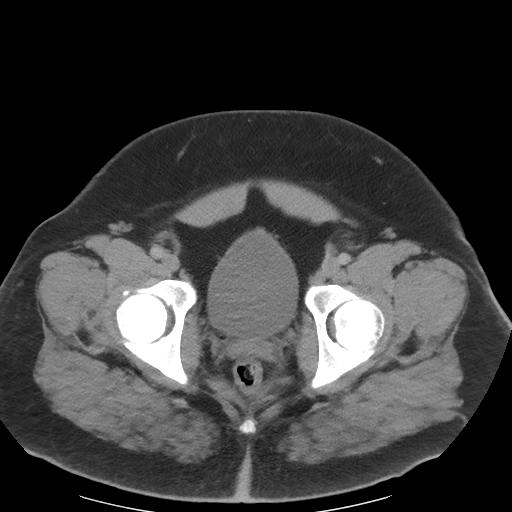
[im 20/110  soft-tissue]
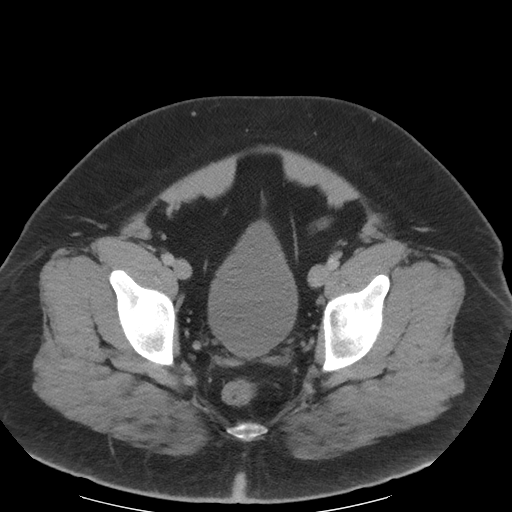
[im 30/110  soft-tissue]
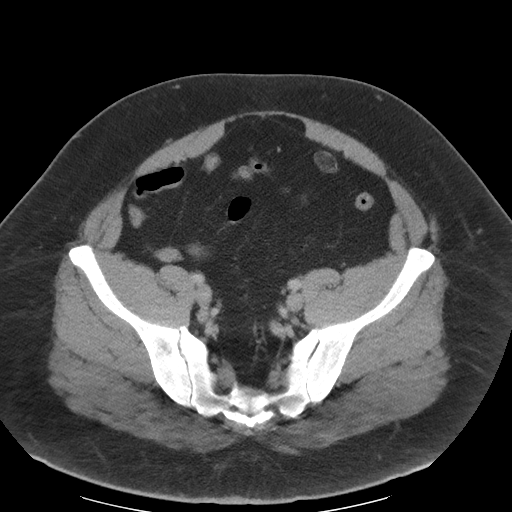
[im 35/110  soft-tissue]
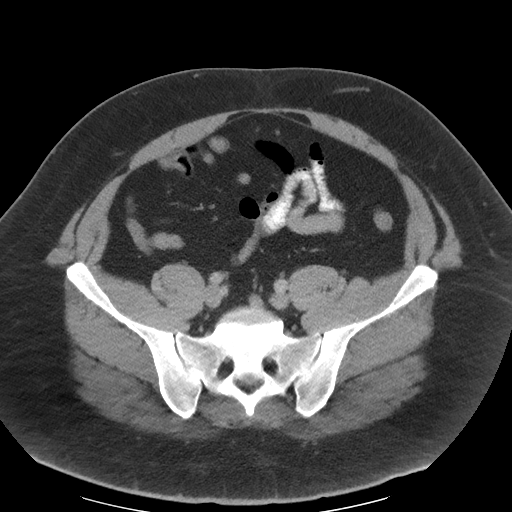
[im 45/110  soft-tissue]
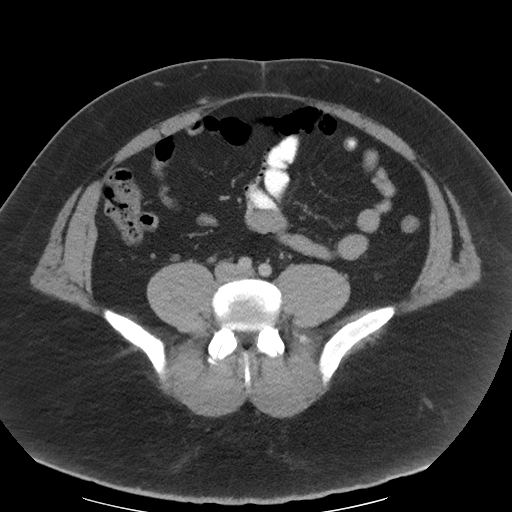
[im 50/110  soft-tissue]
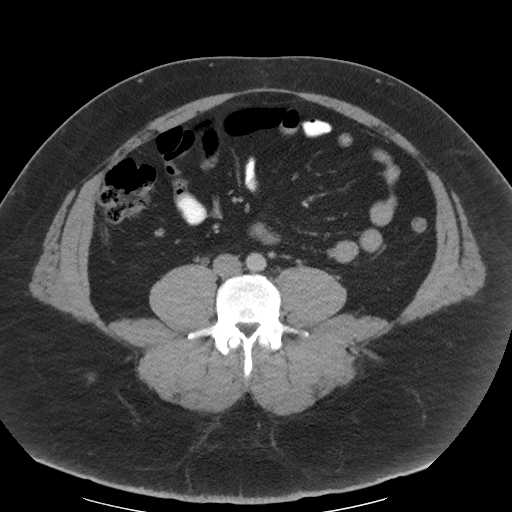
[im 60/110  soft-tissue]
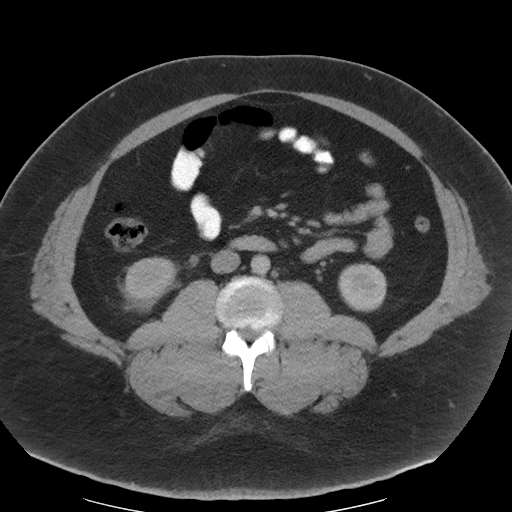
[im 65/110  soft-tissue]
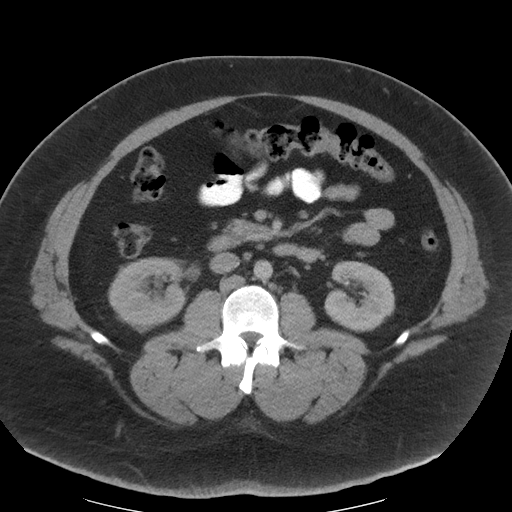
[im 65/110  bone]
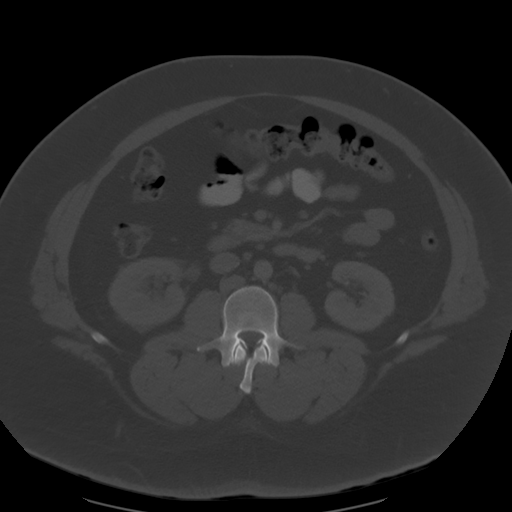
[im 75/110  soft-tissue]
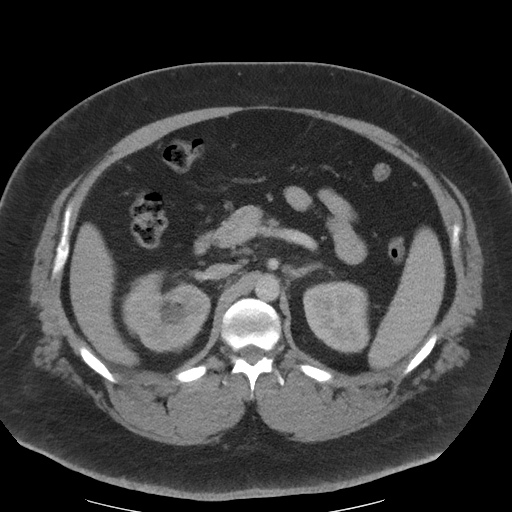
[im 80/110  soft-tissue]
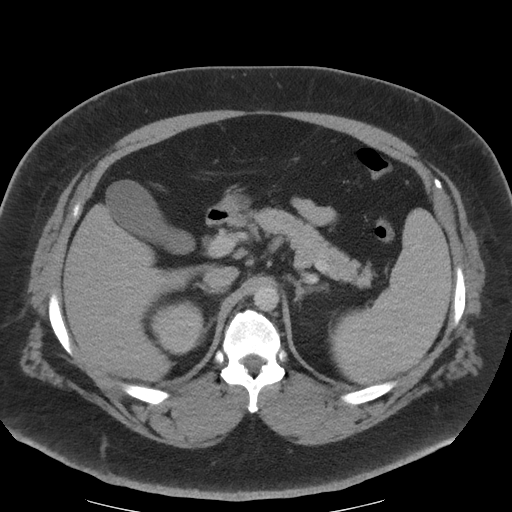
[im 90/110  soft-tissue]
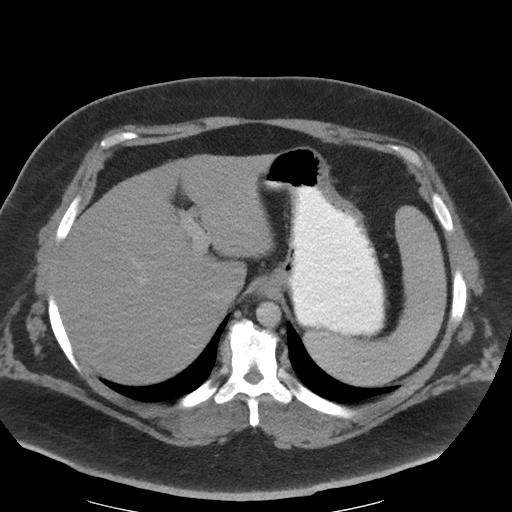
[im 95/110  soft-tissue]
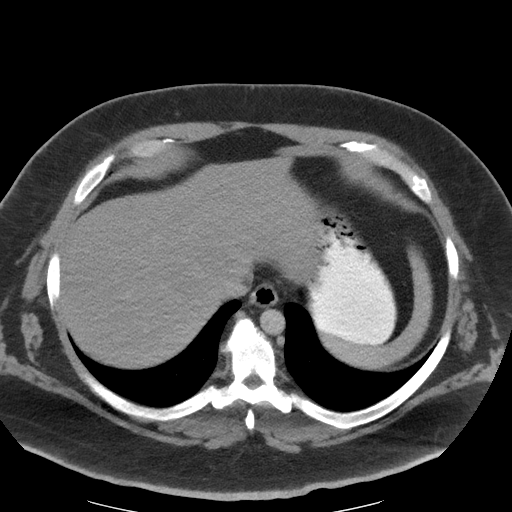
[im 105/110  soft-tissue]
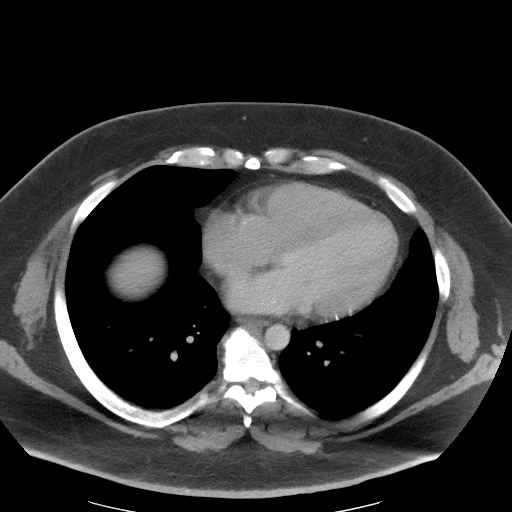

[cor · coronal · 1.06mm/px · 3 of 165 slices shown]
[im 55/165  soft-tissue]
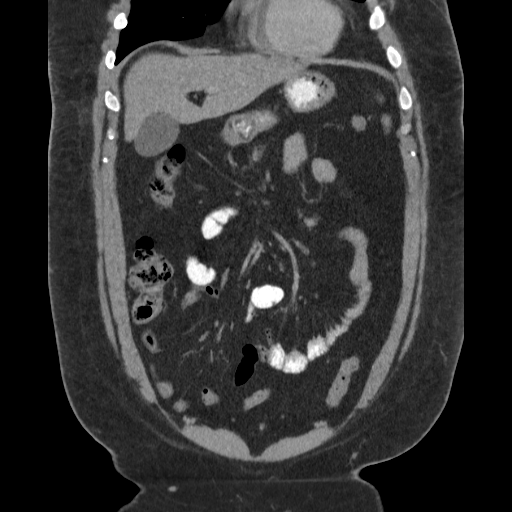
[im 73/165  soft-tissue]
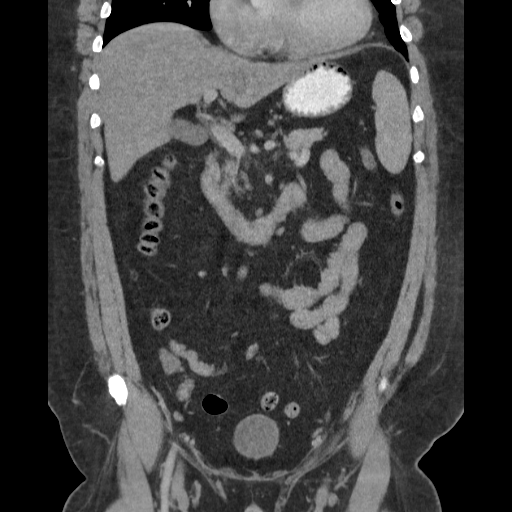
[im 92/165  soft-tissue]
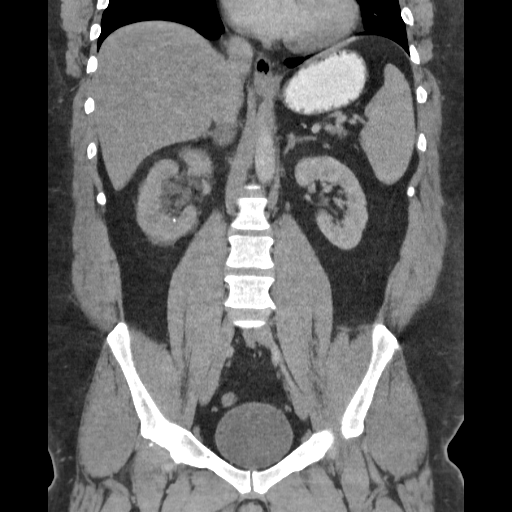

[17 of 46 positions shown; findings below may reference images not displayed]

FINDINGS: The lung bases are free of acute infiltrate or sizable effusion. The
liver, gallbladder, spleen, adrenal glands and pancreas are all
normal in their CT appearance. The left kidney is well visualized
and demonstrates a small 2 mm nonobstructing stone best seen on
image number 43. On the right there is evidence of hydronephrosis
secondary to a mid ureteral spur stone measuring 5 mm. This is best
seen on image number 63. Some smaller nonobstructing stones are
noted within the right kidney. Mild perinephric stranding is seen.

The appendix is within normal limits. The bladder is well distended.
No other focal abnormality is seen.
IMPRESSION: Bilateral nonobstructing renal calculi.

Right mid ureteral stone measuring 5 mm causing mild-to-moderate
obstructive change. .

## 2013-08-07 MED ORDER — LORAZEPAM 2 MG/ML IJ SOLN
0.5000 mg | Freq: Once | INTRAMUSCULAR | Status: AC
  Administered 2013-08-07: 0.5 mg via INTRAVENOUS
  Filled 2013-08-07: qty 1

## 2013-08-07 MED ORDER — HYDROMORPHONE HCL PF 1 MG/ML IJ SOLN
1.0000 mg | Freq: Once | INTRAMUSCULAR | Status: AC
  Administered 2013-08-07: 1 mg via INTRAVENOUS
  Filled 2013-08-07: qty 1

## 2013-08-07 MED ORDER — IOHEXOL 300 MG/ML  SOLN
25.0000 mL | INTRAMUSCULAR | Status: AC
  Administered 2013-08-07: 25 mL via ORAL

## 2013-08-07 MED ORDER — TAMSULOSIN HCL 0.4 MG PO CAPS: 0.4000 mg | ORAL_CAPSULE | Freq: Every day | ORAL | Status: DC

## 2013-08-07 MED ORDER — NAPROXEN 500 MG PO TABS: 500.0000 mg | ORAL_TABLET | Freq: Two times a day (BID) | ORAL | Status: DC

## 2013-08-07 MED ORDER — ONDANSETRON HCL 4 MG/2ML IJ SOLN
4.0000 mg | Freq: Once | INTRAMUSCULAR | Status: AC
  Administered 2013-08-07: 4 mg via INTRAVENOUS
  Filled 2013-08-07: qty 2

## 2013-08-07 MED ORDER — OXYCODONE-ACETAMINOPHEN 5-325 MG PO TABS: 1.0000 | ORAL_TABLET | Freq: Four times a day (QID) | ORAL | Status: DC | PRN

## 2013-08-07 MED ORDER — KETOROLAC TROMETHAMINE 30 MG/ML IJ SOLN
30.0000 mg | Freq: Once | INTRAMUSCULAR | Status: AC
Start: 2013-08-07 — End: 2013-08-07
  Administered 2013-08-07: 30 mg via INTRAVENOUS
  Filled 2013-08-07: qty 1

## 2013-08-07 MED ORDER — SODIUM CHLORIDE 0.9 % IV BOLUS (SEPSIS)
1000.0000 mL | Freq: Once | INTRAVENOUS | Status: AC
  Administered 2013-08-07: 1000 mL via INTRAVENOUS

## 2013-08-07 MED ORDER — IOHEXOL 300 MG/ML  SOLN: 100.0000 mL | Freq: Once | INTRAMUSCULAR | Status: DC | PRN

## 2013-08-07 NOTE — ED Notes (Signed)
ED PA aware of c/o pain, unable to void after bolus.

## 2013-08-07 NOTE — ED Notes (Signed)
Pt reports he woke up at 3:45 with complaints of RLQ abdominal pain with nausea and vomiting x1. Denies abnormal urinary symptoms.

## 2013-08-07 NOTE — ED Notes (Signed)
Unsuccessful with in and out cath, BorgerJosh PA notified. Bladder scan of pt noted >491 ml of urine in bladder. Will continue to give pt water.

## 2013-08-07 NOTE — ED Notes (Signed)
Attempted in and out cath was unsuccessful reported to nurse Danielle.

## 2013-08-07 NOTE — Discharge Instructions (Signed)
Please read and follow all provided instructions.  Your diagnoses today include:  1. Ureteral colic     Tests performed today include:  Urine test that showed blood in your urine and no infection  CT scan which showed a 5 millimeter kidney on the right side  Blood test that showed normal kidney function  Vital signs. See below for your results today.   Medications prescribed:   Percocet (oxycodone/acetaminophen) - narcotic pain medication  DO NOT drive or perform any activities that require you to be awake and alert because this medicine can make you drowsy. BE VERY CAREFUL not to take multiple medicines containing Tylenol (also called acetaminophen). Doing so can lead to an overdose which can damage your liver and cause liver failure and possibly death.   Naproxen - anti-inflammatory pain medication  Do not exceed 500mg  naproxen every 12 hours, take with food  You have been prescribed an anti-inflammatory medication or NSAID. Take with food. Take smallest effective dose for the shortest duration needed for your pain. Stop taking if you experience stomach pain or vomiting.    Flomax (tamsulosin) - relaxes smooth muscle to help kidney stones pass  Take any prescribed medications only as directed.  Home care instructions:  Follow any educational materials contained in this packet.  Please double your fluid intake for the next several days. Strain your urine and save any stones that may pass.   BE VERY CAREFUL not to take multiple medicines containing Tylenol (also called acetaminophen). Doing so can lead to an overdose which can damage your liver and cause liver failure and possibly death.   Follow-up instructions: Please follow-up with your urologist or the urologist referral (provided on front page) in the next 1 week for further evaluation of your symptoms.  If you need to return to the Emergency Department, go to [facility] and not [facility]. The  urologists are located at [facility] and can better care for you at this location.  If you do not have a primary care doctor -- see below for referral information.   Return instructions:  If you need to return to the Emergency Department, go to [facility] and not [facility]. The [facility]rologists are located at [facility] and can better care for you at this location.   Please return to the Emergency Department if you experience worsening symptoms.  Please return if you develop fever or uncontrolled pain or vomiting.  Please return if you have any other emergent concerns.  Additional Information:  Your vital signs today were: BP 142/77   Pulse 62   Temp(Src) 98.2 F (36.8 C) (Oral)   Resp 16   SpO2 97% If your blood pressure (BP) was elevated above 135/85 this visit, please have this repeated by your doctor within one month. --------------

## 2013-08-07 NOTE — ED Notes (Signed)
Hooked pt up after returning from CT scan. Asked pt to provide a urine specimen pt stated he would try in a little while still did not feel like he could provide a urine specimen.

## 2013-08-07 NOTE — ED Notes (Signed)
Report received, assumed care.  

## 2013-08-07 NOTE — ED Provider Notes (Signed)
CSN: [id number]     Arrival date & time 08/07/13  0650 History   First MD Initiated Contact with Patient 08/07/13 [phone]     Chief Complaint  Patient presents with  . Abdominal Pain   (Consider location/radiation/quality/duration/timing/severity/associated sxs/prior Treatment) HPI Comments: Patient with history of morbid obesity, hypertension, no past abdominal surgeries -- presents with complaint of right lower part and abdominal pain which woke him from sleep at approximately 3:45 AM. Pain is non-radiating, described as 'a hurting pain'. Patient has had episode of nausea and vomiting this morning. No back pain. No fever, chest pain, shortness of breath, dysuria, hematuria, diarrhea. No treatments prior to arrival. Patient has not taken his morning medicines. The onset of this condition was acute. The course is constant. Aggravating factors: none. Alleviating factors: none.    Patient is a 33 y.o. male presenting with abdominal pain. The history is provided by the patient.  Abdominal Pain Associated symptoms: nausea and vomiting   Associated symptoms: no chest pain, no cough, no diarrhea, no dysuria, no fever and no sore throat     Past Medical History  Diagnosis Date  . Hypertension   . GERD (gastroesophageal reflux disease)    Past Surgical History  Procedure Laterality Date  . Incision and drainage foot  1991-1992    STEPPED ON NAIL / INFECTED WOUND  . Shoulder surgery      September 08, 2011   Family History  Problem Relation Age of Onset  . Anesthesia problems Sister    History  Substance Use Topics  . Smoking status: Never Smoker   . Smokeless tobacco: Current User    Types: Snuff  . Alcohol Use: 4.2 oz/week    7 Shots of liquor per week    Review of Systems  Constitutional: Negative for fever.  HENT: Negative for rhinorrhea and sore throat.   Eyes: Negative for redness.  Respiratory: Negative for cough.   Cardiovascular: Negative for chest pain.  Gastrointestinal:  Positive for nausea, vomiting and abdominal pain. Negative for diarrhea.  Genitourinary: Negative for dysuria.  Musculoskeletal: Negative for myalgias.  Skin: Negative for rash.  Neurological: Negative for headaches.    Allergies  Review of patient's allergies indicates no known allergies.  Home Medications   Current Outpatient Rx  Name  Route  Sig  Dispense  Refill  . atenolol (TENORMIN) 100 MG tablet   Oral   Take 100 mg by mouth daily.         Marland Kitchen. GARLIC PO   Oral   Take 1 tablet by mouth daily.         . lansoprazole (PREVACID) 30 MG capsule   Oral   Take 30 mg by mouth daily.         Marland Kitchen. lisinopril-hydrochlorothiazide (PRINZIDE,ZESTORETIC) 20-25 MG per tablet   Oral   Take 0.5 tablets by mouth daily.          BP 162/87  Pulse 80  Temp(Src) 98.4 F (36.9 C) (Oral)  Resp 18  SpO2 98% Physical Exam  Nursing note and vitals reviewed. Constitutional: He appears well-developed and well-nourished.  HENT:  Head: Normocephalic and atraumatic.  Eyes: Conjunctivae are normal. Right eye exhibits no discharge. Left eye exhibits no discharge.  Neck: Normal range of motion. Neck supple.  Cardiovascular: Normal rate, regular rhythm and normal heart sounds.   Pulmonary/Chest: Effort normal and breath sounds normal.  Abdominal: Soft. There is tenderness (mild RLQ tenderness). There is no rebound and no guarding.  Exam limited  due to habitus.   Neurological: He is alert.  Skin: Skin is warm and dry.  Psychiatric: He has a normal mood and affect.    ED Course  Procedures (including critical care time) Labs Review Labs Reviewed  CBC WITH DIFFERENTIAL - Abnormal; Notable for the following:    WBC 10.7 (*)    Neutrophils Relative % 80 (*)    Neutro Abs 8.6 (*)    All other components within normal limits  COMPREHENSIVE METABOLIC PANEL - Abnormal; Notable for the following:    Glucose, Bld 110 (*)    GFR calc non Af Amer 71 (*)    GFR calc Af Amer 83 (*)    All  other components within normal limits  URINALYSIS, ROUTINE W REFLEX MICROSCOPIC - Abnormal; Notable for the following:    Hgb urine dipstick MODERATE (*)    All other components within normal limits  GC/CHLAMYDIA PROBE AMP  LIPASE, BLOOD  URINE MICROSCOPIC-ADD ON   Imaging Review Ct Abdomen Pelvis W Contrast  08/07/2013   CLINICAL DATA:  Right-sided abdominal pain  EXAM: CT ABDOMEN AND PELVIS WITH CONTRAST  TECHNIQUE: Multidetector CT imaging of the abdomen and pelvis was performed using the standard protocol following bolus administration of intravenous contrast.  CONTRAST:  100 mL Omnipaque 300.  COMPARISON:  11/13/2005  FINDINGS: The lung bases are free of acute infiltrate or sizable effusion. The liver, gallbladder, spleen, adrenal glands and pancreas are all normal in their CT appearance. The left kidney is well visualized and demonstrates a small 2 mm nonobstructing stone best seen on image number 43. On the right there is evidence of hydronephrosis secondary to a mid ureteral spur stone measuring 5 mm. This is best seen on image number 63. Some smaller nonobstructing stones are noted within the right kidney. Mild perinephric stranding is seen.  The appendix is within normal limits. The bladder is well distended. No other focal abnormality is seen.  IMPRESSION: Bilateral nonobstructing renal calculi.  Right mid ureteral stone measuring 5 mm causing mild-to-moderate obstructive change. .   Electronically Signed   By: Alcide Clever M.D.   On: 08/07/2013 12:32    EKG Interpretation   None      7:09 AM Patient seen and examined. Work-up initiated. Medications ordered.   Vital signs reviewed and are as follows: Filed Vitals:   08/07/13 0701  BP: 162/87  Pulse: 80  Temp: 98.4 F (36.9 C)  Resp: 18   1:19 PM CT shows stone. Patient has urinary retention. Nurse unable to pass foley cath x2. in bladder per bladder scan.   3:38 PM Patient received some ativan and urinated 650cc.    Patient counseled on kidney stone treatment. Urged patient to strain urine and save any stones. Urged urology follow-up and return to [facility] with any complications. Counseled patient to maintain good fluid intake.   Counseled patient on use of flomax.   Patient counseled on use of narcotic pain medications. Counseled not to combine these medications with others containing tylenol. Urged not to drink alcohol, drive, or perform any other activities that requires focus while taking these medications. The patient verbalizes understanding and agrees with the plan.    MDM   1. Ureteral colic    Patient with symptoms consistent with ureteral colic, confirmed on CT. Patient did have trouble urinating during ED stay, but is now urinating without problem. Pain controlled. No infection in urine. D/c to home with pain control and urology f/u.   No  dangerous or life-threatening conditions suspected or identified by history, physical exam, and by work-up. No indications for hospitalization identified.      Renne Crigler, PA-C 08/07/13 [phone]

## 2013-08-08 LAB — GC/CHLAMYDIA PROBE AMP
CT PROBE, AMP APTIMA: NEGATIVE
GC Probe RNA: NEGATIVE

## 2013-08-08 NOTE — ED Provider Notes (Signed)
Medical screening examination/treatment/procedure(s) were performed by non-physician practitioner and as supervising physician I was immediately available for consultation/collaboration.     Joyanna Kleman, MD 08/08/13 0714 

## 2015-01-01 ENCOUNTER — Other Ambulatory Visit (HOSPITAL_COMMUNITY): Payer: Self-pay | Admitting: Family Medicine

## 2015-01-01 DIAGNOSIS — R609 Edema, unspecified: Secondary | ICD-10-CM

## 2015-01-02 ENCOUNTER — Other Ambulatory Visit: Payer: Self-pay | Admitting: Surgery

## 2015-01-02 ENCOUNTER — Inpatient Hospital Stay (HOSPITAL_BASED_OUTPATIENT_CLINIC_OR_DEPARTMENT_OTHER)
Admission: EM | Admit: 2015-01-02 | Discharge: 2015-01-06 | DRG: 872 | Disposition: A | Discharge status: Home / Self Care | Payer: 59 | Attending: Internal Medicine | Admitting: Internal Medicine

## 2015-01-02 ENCOUNTER — Ambulatory Visit (HOSPITAL_COMMUNITY)
Admission: RE | Admit: 2015-01-02 | Discharge: 2015-01-02 | Disposition: A | Discharge status: Home / Self Care | Payer: 59 | Source: Ambulatory Visit | Attending: Family Medicine | Admitting: Family Medicine

## 2015-01-02 ENCOUNTER — Encounter (HOSPITAL_BASED_OUTPATIENT_CLINIC_OR_DEPARTMENT_OTHER): Payer: Self-pay | Admitting: *Deleted

## 2015-01-02 DIAGNOSIS — K219 Gastro-esophageal reflux disease without esophagitis: Secondary | ICD-10-CM | POA: Diagnosis present

## 2015-01-02 DIAGNOSIS — R609 Edema, unspecified: Secondary | ICD-10-CM | POA: Insufficient documentation

## 2015-01-02 DIAGNOSIS — Z6841 Body Mass Index (BMI) 40.0 and over, adult: Secondary | ICD-10-CM

## 2015-01-02 DIAGNOSIS — I10 Essential (primary) hypertension: Secondary | ICD-10-CM | POA: Diagnosis present

## 2015-01-02 DIAGNOSIS — Z713 Dietary counseling and surveillance: Secondary | ICD-10-CM | POA: Diagnosis present

## 2015-01-02 DIAGNOSIS — F17220 Nicotine dependence, chewing tobacco, uncomplicated: Secondary | ICD-10-CM | POA: Diagnosis present

## 2015-01-02 DIAGNOSIS — Z791 Long term (current) use of non-steroidal anti-inflammatories (NSAID): Secondary | ICD-10-CM

## 2015-01-02 DIAGNOSIS — A419 Sepsis, unspecified organism: Principal | ICD-10-CM | POA: Diagnosis present

## 2015-01-02 DIAGNOSIS — L03115 Cellulitis of right lower limb: Secondary | ICD-10-CM | POA: Diagnosis present

## 2015-01-02 DIAGNOSIS — R739 Hyperglycemia, unspecified: Secondary | ICD-10-CM | POA: Diagnosis present

## 2015-01-02 DIAGNOSIS — M79604 Pain in right leg: Secondary | ICD-10-CM | POA: Insufficient documentation

## 2015-01-02 DIAGNOSIS — M7989 Other specified soft tissue disorders: Secondary | ICD-10-CM | POA: Diagnosis present

## 2015-01-02 DIAGNOSIS — F1010 Alcohol abuse, uncomplicated: Secondary | ICD-10-CM | POA: Diagnosis present

## 2015-01-02 MED ORDER — ACETAMINOPHEN 500 MG PO TABS
ORAL_TABLET | ORAL | Status: AC
  Filled 2015-01-02: qty 2

## 2015-01-02 MED ORDER — ACETAMINOPHEN 500 MG PO TABS
1000.0000 mg | ORAL_TABLET | Freq: Once | ORAL | Status: AC
  Administered 2015-01-02: 1000 mg via ORAL

## 2015-01-02 MED ORDER — SODIUM CHLORIDE 0.9 % IV BOLUS (SEPSIS)
1000.0000 mL | Freq: Once | INTRAVENOUS | Status: AC
  Administered 2015-01-03: 1000 mL via INTRAVENOUS

## 2015-01-02 MED ORDER — KETOROLAC TROMETHAMINE 30 MG/ML IJ SOLN
30.0000 mg | Freq: Once | INTRAMUSCULAR | Status: AC
  Administered 2015-01-03: 30 mg via INTRAVENOUS
  Filled 2015-01-02: qty 1

## 2015-01-02 MED ORDER — VANCOMYCIN HCL IN DEXTROSE 1-5 GM/200ML-% IV SOLN
1000.0000 mg | Freq: Once | INTRAVENOUS | Status: AC
  Administered 2015-01-03: 1000 mg via INTRAVENOUS
  Filled 2015-01-02: qty 200

## 2015-01-02 MED ORDER — ACETAMINOPHEN 500 MG PO TABS: 1000.0000 mg | ORAL_TABLET | Freq: Once | ORAL | Status: DC

## 2015-01-02 NOTE — ED Provider Notes (Addendum)
This chart was scribed for Justin Maw Kash Mothershead, DO by Arlan Organ, ED Scribe. This patient was seen in room MH02/MH02 and the patient's care was started 11:52 PM.   TIME SEEN: 11:52 PM   CHIEF COMPLAINT:  Chief Complaint  Patient presents with  . Leg Swelling     HPI:  HPI Comments: Justin Mason is a 34 y.o. male with a PMHx of HTN who presents to the Emergency Department complaining of constant, ongoing, progressively worsening R lower leg swelling x 6 days. There is also associated redness to the leg. Pt reports new onset fever and chills x 1 day. No OTC/prescribed medication attempted prior to arrival. No aggravating factors. No radiation of pain. Described as moderate, throbbing, achy. Has had nausea. No recent vomiting or diarrhea, abdominal pain, chest pain, or shortness of breath. Justin Mason was evaluated by his PCP 2 days and was sent for an ultrasound of the lower extremity today to rule out DVT. Venous Doppler was negative. No history of injury to the leg. No antibiotics started after recent visit with PCP. No previous history of cellulitis. No known allergies to medications.   PCP is Dr. Jeannetta Nap   ROS: See HPI Constitutional: Positive for fever and chills Eyes: no drainage  ENT: no runny nose   Cardiovascular:  no chest pain. Positive for leg swelling Resp: no SOB  GI: no vomiting GU: no dysuria Integumentary: no rash  Allergy: no hives  Musculoskeletal: no leg swelling  Neurological: no slurred speech ROS otherwise negative  PAST MEDICAL HISTORY/PAST SURGICAL HISTORY:  Past Medical History  Diagnosis Date  . Hypertension   . GERD (gastroesophageal reflux disease)     MEDICATIONS:  Prior to Admission medications   Medication Sig Start Date End Date Taking? Authorizing Provider  amLODipine (NORVASC) 5 MG tablet Take 5 mg by mouth daily.    Historical Provider, MD  atenolol (TENORMIN) 100 MG tablet Take 100 mg by mouth daily.    Historical Provider, MD  esomeprazole  (NEXIUM) 20 MG capsule Take 20 mg by mouth daily as needed (for acid reflux).    Historical Provider, MD  naproxen (NAPROSYN) 500 MG tablet Take 1 tablet (500 mg total) by mouth 2 (two) times daily. 08/07/13   Renne Crigler, PA-C  Naproxen Sodium (ALEVE PO) Take 2 tablets by mouth daily as needed (for pain).    Historical Provider, MD  oxyCODONE-acetaminophen (PERCOCET/ROXICET) 5-325 MG per tablet Take 1-2 tablets by mouth every 6 (six) hours as needed for severe pain. 08/07/13   Renne Crigler, PA-C  tamsulosin (FLOMAX) 0.4 MG CAPS capsule Take 1 capsule (0.4 mg total) by mouth daily. 08/07/13   Renne Crigler, PA-C    ALLERGIES:  No Known Allergies  SOCIAL HISTORY:  History  Substance Use Topics  . Smoking status: Never Smoker   . Smokeless tobacco: Current User    Types: Snuff  . Alcohol Use: 4.2 oz/week    7 Shots of liquor per week    FAMILY HISTORY: Family History  Problem Relation Age of Onset  . Anesthesia problems Sister     EXAM: BP 166/108 mmHg  Pulse 122  Temp(Src) 101.1 F (38.4 C) (Oral)  Resp 20  SpO2 98% CONSTITUTIONAL: Alert and oriented and responds appropriately to questions. Well-appearing; well-nourished; GCS 15, febrile, nontoxic HEAD: Normocephalic; atraumatic EYES: Conjunctivae clear, PERRL, EOMI ENT: normal nose; no rhinorrhea; moist mucous membranes; pharynx without lesions noted; no dental injury; no septal hematoma NECK: Supple, no meningismus, no LAD; no  midline spinal tenderness, step-off or deformity CARD: Regular and tachycardic; S1 and S2 appreciated; no murmurs, no clicks, no rubs, no gallops RESP: Normal chest excursion without splinting or tachypnea; breath sounds clear and equal bilaterally; no wheezes, no rhonchi, no rales; no hypoxia or respiratory distress CHEST:  chest wall stable, no crepitus or ecchymosis or deformity, nontender to palpation ABD/GI: Normal bowel sounds; non-distended; soft, non-tender, no rebound, no guarding PELVIS:   stable, nontender to palpation BACK:  The back appears normal and is non-tender to palpation, there is no CVA tenderness; no midline spinal tenderness, step-off or deformity EXT: Normal ROM in all joints; tender over the right lateral thigh and calf; normal capillary refill; no cyanosis, no bony tenderness or bony deformity of patient's extremities,  no ecchymosis or lacerations. Erythema and warmth from medial R thigh to medial R calf and over dorsal distal anterior skin. No joint effusion. 2+ DP pulses bilaterally. Normal sensation diffusely. No fluctuance or induration. SKIN: Normal color for age and race; warm NEURO: Moves all extremities equally, sensation to light touch intact diffusely, cranial nerves II through XII intact PSYCH: The patient's mood and manner are appropriate. Grooming and personal hygiene are appropriate.  MEDICAL DECISION MAKING: Patient here with cellulitis to the right lower extremity.  No sign of septic arthritis on exam. He has no joint effusions and normal NP was range of motion in all joints of the right lower extremity. Neurovascularly intact distally. No history of injury. Is not on antibiotics. Is not a diabetic and is not immunocompromised. Today he is febrile and tachycardic but nontoxic appearing. Had an outpatient venous Doppler which was negative for DVT. Will obtain labs, cultures. We'll outline area of erythema with tissue marker. We'll give IV fluids, vancomycin, Toradol for pain, Tylenol for fever.  ED PROGRESS: Patient has a lactate of almost 3. Given that he is also tachycardic, febrile, will give broad-spectrum antibiotics vancomycin and Zosyn to cover for sepsis. We'll give IV fluids but I feel patient will need admission.   1:20 AM  Pt's labs show leukocytosis of 22,000 with left shift. His fever continues to rise despite Tylenol and Toradol. He is receiving IV fluids and is otherwise hemodynamically stable. Have recommended admission as he meets sepsis  criteria. He agrees with admission to [facility].  I will discuss with hospitalist on call.  1:45 AM  D/w Dr. Clyde Lundborg with hospitalist service who agrees for admission to telemetry bed, inpatient.    CRITICAL CARE Performed by: Raelyn Number   Total critical care time: 40 minutes  Critical care time was exclusive of separately billable procedures and treating other patients.  Critical care was necessary to treat or prevent imminent or life-threatening deterioration.  Critical care was time spent personally by me on the following activities: development of treatment plan with patient and/or surrogate as well as nursing, discussions with consultants, evaluation of patient's response to treatment, examination of patient, obtaining history from patient or surrogate, ordering and performing treatments and interventions, ordering and review of laboratory studies, ordering and review of radiographic studies, pulse oximetry and re-evaluation of patient's condition.   I personally performed the services described in this documentation, which was scribed in my presence. The recorded information has been reviewed and is accurate.   Justin Maw Colt Martelle, DO 01/03/15 0145    EKG Interpretation  Date/Time:  Thursday January 03 2015 01:54:16 EDT Ventricular Rate:  103 PR Interval:  182 QRS Duration: 90 QT Interval:  318 QTC Calculation: 416  R Axis:   36 Text Interpretation:  Sinus tachycardia Low voltage QRS Cannot rule out Anterior infarct , age undetermined Abnormal ECG No significant change since last tracing Confirmed by Viviane Semidey,  DO, Terie Lear [phone]([id number]) on 01/03/2015 1:57:07 AM        Justin MawKristen N Yanett Conkright, DO 01/03/15 0157

## 2015-01-02 NOTE — Progress Notes (Signed)
VASCULAR LAB PRELIMINARY  PRELIMINARY  PRELIMINARY  PRELIMINARY  Right lower extremity venous duplex completed.    Preliminary report:  Right:  No evidence of DVT, superficial thrombosis, or Baker's cyst.  Kylena Mole, RVS 01/02/2015, 4:43 PM

## 2015-01-02 NOTE — ED Notes (Signed)
Rt leg redness, swelling and pain x 6 days  Denies inj

## 2015-01-02 NOTE — ED Notes (Signed)
Pt c/o right lower leg pain with swelling and redness x 6 days ago , US today neg for DVT , increased redness and swelling tonight

## 2015-01-03 ENCOUNTER — Encounter (HOSPITAL_COMMUNITY): Payer: Self-pay | Admitting: General Practice

## 2015-01-03 DIAGNOSIS — I10 Essential (primary) hypertension: Secondary | ICD-10-CM | POA: Diagnosis present

## 2015-01-03 DIAGNOSIS — A419 Sepsis, unspecified organism: Secondary | ICD-10-CM | POA: Diagnosis present

## 2015-01-03 DIAGNOSIS — L0390 Cellulitis, unspecified: Secondary | ICD-10-CM

## 2015-01-03 DIAGNOSIS — F1010 Alcohol abuse, uncomplicated: Secondary | ICD-10-CM | POA: Diagnosis present

## 2015-01-03 DIAGNOSIS — Z713 Dietary counseling and surveillance: Secondary | ICD-10-CM | POA: Diagnosis present

## 2015-01-03 DIAGNOSIS — F17220 Nicotine dependence, chewing tobacco, uncomplicated: Secondary | ICD-10-CM | POA: Diagnosis present

## 2015-01-03 DIAGNOSIS — K219 Gastro-esophageal reflux disease without esophagitis: Secondary | ICD-10-CM | POA: Diagnosis not present

## 2015-01-03 DIAGNOSIS — R739 Hyperglycemia, unspecified: Secondary | ICD-10-CM | POA: Diagnosis present

## 2015-01-03 DIAGNOSIS — Z791 Long term (current) use of non-steroidal anti-inflammatories (NSAID): Secondary | ICD-10-CM | POA: Diagnosis not present

## 2015-01-03 DIAGNOSIS — M7989 Other specified soft tissue disorders: Secondary | ICD-10-CM | POA: Diagnosis not present

## 2015-01-03 DIAGNOSIS — L03115 Cellulitis of right lower limb: Secondary | ICD-10-CM | POA: Insufficient documentation

## 2015-01-03 DIAGNOSIS — Z6841 Body Mass Index (BMI) 40.0 and over, adult: Secondary | ICD-10-CM | POA: Diagnosis not present

## 2015-01-03 HISTORY — DX: Cellulitis, unspecified: L03.90

## 2015-01-03 HISTORY — DX: Cellulitis of right lower limb: L03.115

## 2015-01-03 HISTORY — DX: Sepsis, unspecified organism: A41.9

## 2015-01-03 LAB — COMPREHENSIVE METABOLIC PANEL
ALT: 37 U/L (ref 17–63)
AST: 27 U/L (ref 15–41)
Albumin: 3.3 g/dL — ABNORMAL LOW (ref 3.5–5.0)
Alkaline Phosphatase: 54 U/L (ref 38–126)
Anion gap: 10 (ref 5–15)
BUN: 9 mg/dL (ref 6–20)
CALCIUM: 8.8 mg/dL — AB (ref 8.9–10.3)
CO2: 26 mmol/L (ref 22–32)
CREATININE: 1.36 mg/dL — AB (ref 0.61–1.24)
Chloride: 101 mmol/L (ref 101–111)
GFR calc Af Amer: >60 mL/min (ref >60)
GFR calc non Af Amer: >60 mL/min (ref >60)
GLUCOSE: 129 mg/dL — AB (ref 65–99)
Potassium: 4 mmol/L (ref 3.5–5.1)
Sodium: 137 mmol/L (ref 135–145)
Total Bilirubin: 1.1 mg/dL (ref 0.3–1.2)
Total Protein: 6.6 g/dL (ref 6.5–8.1)

## 2015-01-03 LAB — BASIC METABOLIC PANEL
ANION GAP: 12 (ref 5–15)
BUN: 11 mg/dL (ref 6–20)
CHLORIDE: 100 mmol/L — AB (ref 101–111)
CO2: 24 mmol/L (ref 22–32)
CREATININE: 1.22 mg/dL (ref 0.61–1.24)
Calcium: 9.6 mg/dL (ref 8.9–10.3)
GFR calc Af Amer: >60 mL/min (ref >60)
GFR calc non Af Amer: >60 mL/min (ref >60)
GLUCOSE: 173 mg/dL — AB (ref 65–99)
Potassium: 3.5 mmol/L (ref 3.5–5.1)
SODIUM: 136 mmol/L (ref 135–145)

## 2015-01-03 LAB — CBC WITH DIFFERENTIAL/PLATELET
BASOS PCT: 0 % (ref 0–1)
Basophils Absolute: 0 10*3/uL (ref 0.0–0.1)
Basophils Absolute: 0 10*3/uL (ref 0.0–0.1)
Basophils Relative: 0 % (ref 0–1)
EOS ABS: 0 10*3/uL (ref 0.0–0.7)
EOS ABS: 0 10*3/uL (ref 0.0–0.7)
EOS PCT: 0 % (ref 0–5)
Eosinophils Relative: 0 % (ref 0–5)
HEMATOCRIT: 41.8 % (ref 39.0–52.0)
HEMATOCRIT: 43.5 % (ref 39.0–52.0)
HEMOGLOBIN: 14.9 g/dL (ref 13.0–17.0)
Hemoglobin: 15.2 g/dL (ref 13.0–17.0)
LYMPHS ABS: 1.2 10*3/uL (ref 0.7–4.0)
LYMPHS ABS: 1.3 10*3/uL (ref 0.7–4.0)
Lymphocytes Relative: 5 % — ABNORMAL LOW (ref 12–46)
Lymphocytes Relative: 7 % — ABNORMAL LOW (ref 12–46)
MCH: 30.3 pg (ref 26.0–34.0)
MCH: 30.6 pg (ref 26.0–34.0)
MCHC: 34.9 g/dL (ref 30.0–36.0)
MCHC: 35.6 g/dL (ref 30.0–36.0)
MCV: 86.7 fL (ref 78.0–100.0)
MCV: 85.8 fL (ref 78.0–100.0)
Monocytes Absolute: 1.5 10*3/uL — ABNORMAL HIGH (ref 0.1–1.0)
Monocytes Absolute: 1.3 10*3/uL — ABNORMAL HIGH (ref 0.1–1.0)
Monocytes Relative: 7 % (ref 3–12)
Monocytes Relative: 7 % (ref 3–12)
Neutro Abs: 19.3 10*3/uL — ABNORMAL HIGH (ref 1.7–7.7)
Neutro Abs: 15.4 10*3/uL — ABNORMAL HIGH (ref 1.7–7.7)
Neutrophils Relative %: 88 % — ABNORMAL HIGH (ref 43–77)
Neutrophils Relative %: 86 % — ABNORMAL HIGH (ref 43–77)
PLATELETS: 145 10*3/uL — AB (ref 150–400)
PLATELETS: 164 10*3/uL (ref 150–400)
RBC: 4.87 MIL/uL (ref 4.22–5.81)
RBC: 5.02 MIL/uL (ref 4.22–5.81)
RDW: 12.7 % (ref 11.5–15.5)
RDW: 12.9 % (ref 11.5–15.5)
WBC: 22 10*3/uL — ABNORMAL HIGH (ref 4.0–10.5)
WBC: 18 10*3/uL — AB (ref 4.0–10.5)

## 2015-01-03 LAB — PROCALCITONIN: PROCALCITONIN: 0.55 ng/mL

## 2015-01-03 LAB — I-STAT CG4 LACTIC ACID, ED: Lactic Acid, Venous: 2.91 mmol/L (ref 0.5–2.0)

## 2015-01-03 LAB — PROTIME-INR
INR: 1.23 (ref 0.00–1.49)
PROTHROMBIN TIME: 15.6 s — AB (ref 11.6–15.2)

## 2015-01-03 LAB — LACTIC ACID, PLASMA: Lactic Acid, Venous: 2.2 mmol/L (ref 0.5–2.0)

## 2015-01-03 LAB — APTT: aPTT: 38 seconds — ABNORMAL HIGH (ref 24–37)

## 2015-01-03 MED ORDER — LORAZEPAM 1 MG PO TABS: 0.0000 mg | ORAL_TABLET | Freq: Four times a day (QID) | ORAL | Status: AC

## 2015-01-03 MED ORDER — THIAMINE HCL 100 MG/ML IJ SOLN
100.0000 mg | Freq: Every day | INTRAMUSCULAR | Status: DC
  Filled 2015-01-03: qty 1

## 2015-01-03 MED ORDER — ACETAMINOPHEN 325 MG PO TABS
650.0000 mg | ORAL_TABLET | Freq: Four times a day (QID) | ORAL | Status: DC | PRN
  Administered 2015-01-03 – 2015-01-04 (×4): 650 mg via ORAL
  Filled 2015-01-03 (×4): qty 2

## 2015-01-03 MED ORDER — LORAZEPAM 2 MG/ML IJ SOLN: 1.0000 mg | Freq: Four times a day (QID) | INTRAMUSCULAR | Status: DC | PRN

## 2015-01-03 MED ORDER — FOLIC ACID 1 MG PO TABS
1.0000 mg | ORAL_TABLET | Freq: Every day | ORAL | Status: DC
  Administered 2015-01-03 – 2015-01-06 (×4): 1 mg via ORAL
  Filled 2015-01-03 (×4): qty 1

## 2015-01-03 MED ORDER — PIPERACILLIN-TAZOBACTAM 3.375 G IVPB 30 MIN: 3.3750 g | Freq: Once | INTRAVENOUS | Status: DC

## 2015-01-03 MED ORDER — AMLODIPINE BESYLATE 5 MG PO TABS
5.0000 mg | ORAL_TABLET | Freq: Every day | ORAL | Status: DC
  Administered 2015-01-03 – 2015-01-06 (×4): 5 mg via ORAL
  Filled 2015-01-03 (×4): qty 1

## 2015-01-03 MED ORDER — SODIUM CHLORIDE 0.9 % IV SOLN
INTRAVENOUS | Status: AC
  Administered 2015-01-03 (×3): via INTRAVENOUS

## 2015-01-03 MED ORDER — LORAZEPAM 1 MG PO TABS
0.0000 mg | ORAL_TABLET | Freq: Two times a day (BID) | ORAL | Status: DC
Start: 2015-01-05 — End: 2015-01-06

## 2015-01-03 MED ORDER — MORPHINE SULFATE 2 MG/ML IJ SOLN: 2.0000 mg | INTRAMUSCULAR | Status: DC | PRN

## 2015-01-03 MED ORDER — ONDANSETRON HCL 4 MG/2ML IJ SOLN: 4.0000 mg | Freq: Four times a day (QID) | INTRAMUSCULAR | Status: DC | PRN

## 2015-01-03 MED ORDER — PIPERACILLIN-TAZOBACTAM 3.375 G IVPB
3.3750 g | Freq: Three times a day (TID) | INTRAVENOUS | Status: DC
  Administered 2015-01-03 – 2015-01-05 (×7): 3.375 g via INTRAVENOUS
  Filled 2015-01-03 (×9): qty 50

## 2015-01-03 MED ORDER — VANCOMYCIN HCL 10 G IV SOLR
1500.0000 mg | Freq: Three times a day (TID) | INTRAVENOUS | Status: DC
  Administered 2015-01-03 – 2015-01-06 (×10): 1500 mg via INTRAVENOUS
  Filled 2015-01-03 (×13): qty 1500

## 2015-01-03 MED ORDER — VITAMIN B-1 100 MG PO TABS
100.0000 mg | ORAL_TABLET | Freq: Every day | ORAL | Status: DC
  Administered 2015-01-03 – 2015-01-05 (×3): 100 mg via ORAL
  Filled 2015-01-03 (×4): qty 1

## 2015-01-03 MED ORDER — SODIUM CHLORIDE 0.9 % IV BOLUS (SEPSIS): 1000.0000 mL | Freq: Once | INTRAVENOUS | Status: DC

## 2015-01-03 MED ORDER — ADULT MULTIVITAMIN W/MINERALS CH
1.0000 | ORAL_TABLET | Freq: Every day | ORAL | Status: DC
  Administered 2015-01-03 – 2015-01-06 (×4): 1 via ORAL
  Filled 2015-01-03 (×4): qty 1

## 2015-01-03 MED ORDER — ATENOLOL 100 MG PO TABS
100.0000 mg | ORAL_TABLET | Freq: Every day | ORAL | Status: DC
  Administered 2015-01-03 – 2015-01-06 (×4): 100 mg via ORAL
  Filled 2015-01-03 (×4): qty 1

## 2015-01-03 MED ORDER — LORAZEPAM 1 MG PO TABS: 1.0000 mg | ORAL_TABLET | Freq: Four times a day (QID) | ORAL | Status: DC | PRN

## 2015-01-03 MED ORDER — DOXYCYCLINE HYCLATE 100 MG PO TABS
100.0000 mg | ORAL_TABLET | Freq: Two times a day (BID) | ORAL | Status: DC
  Filled 2015-01-03: qty 1

## 2015-01-03 MED ORDER — SODIUM CHLORIDE 0.9 % IV SOLN: INTRAVENOUS | Status: DC

## 2015-01-03 MED ORDER — SODIUM CHLORIDE 0.9 % IV BOLUS (SEPSIS)
1000.0000 mL | Freq: Once | INTRAVENOUS | Status: AC
  Administered 2015-01-03: 1000 mL via INTRAVENOUS

## 2015-01-03 MED ORDER — PIPERACILLIN-TAZOBACTAM 3.375 G IVPB
3.3750 g | Freq: Once | INTRAVENOUS | Status: AC
  Administered 2015-01-03: 3.375 g via INTRAVENOUS
  Filled 2015-01-03: qty 50

## 2015-01-03 MED ORDER — ACETAMINOPHEN 650 MG RE SUPP: 650.0000 mg | Freq: Four times a day (QID) | RECTAL | Status: DC | PRN

## 2015-01-03 MED ORDER — DOXYCYCLINE HYCLATE 100 MG IV SOLR
100.0000 mg | Freq: Two times a day (BID) | INTRAVENOUS | Status: DC
  Administered 2015-01-03: 100 mg via INTRAVENOUS
  Filled 2015-01-03 (×2): qty 100

## 2015-01-03 MED ORDER — PANTOPRAZOLE SODIUM 40 MG PO TBEC
40.0000 mg | DELAYED_RELEASE_TABLET | Freq: Every day | ORAL | Status: DC
  Administered 2015-01-03 – 2015-01-06 (×4): 40 mg via ORAL
  Filled 2015-01-03 (×3): qty 1

## 2015-01-03 MED ORDER — ONDANSETRON HCL 4 MG PO TABS: 4.0000 mg | ORAL_TABLET | Freq: Four times a day (QID) | ORAL | Status: DC | PRN

## 2015-01-03 MED ORDER — ENOXAPARIN SODIUM 100 MG/ML ~~LOC~~ SOLN
90.0000 mg | Freq: Every day | SUBCUTANEOUS | Status: DC
  Administered 2015-01-03 – 2015-01-05 (×3): 90 mg via SUBCUTANEOUS
  Filled 2015-01-03 (×4): qty 1

## 2015-01-03 MED ORDER — HYDRALAZINE HCL 20 MG/ML IJ SOLN
5.0000 mg | INTRAMUSCULAR | Status: DC | PRN
  Administered 2015-01-03: 5 mg via INTRAVENOUS
  Filled 2015-01-03: qty 1

## 2015-01-03 MED ORDER — VANCOMYCIN HCL IN DEXTROSE 1-5 GM/200ML-% IV SOLN: 1000.0000 mg | Freq: Once | INTRAVENOUS | Status: DC

## 2015-01-03 NOTE — Progress Notes (Signed)
Lactic Acid down to 2.2 with am labs.  02 sat 81% on room air and up to 98 % on 2L.  Last B/P remains high  @ 195/86 @ 0830.  Hydralize 5mg  IV given @ [id number] by night shift RN.  Dr. Rennis PettyGhirmire informed via text page.

## 2015-01-03 NOTE — Progress Notes (Signed)
Admitted pt to rm 3E16 from [facility] via [facility], alert and oriented, no c/o pain at this time. Admission assessment done, oriented to room, call bell placed within reach.MD paged for admission orders.

## 2015-01-03 NOTE — Progress Notes (Signed)
ANTIBIOTIC CONSULT NOTE - INITIAL  Pharmacy Consult for Vancomycin and Zosyn  Indication: rule out sepsis  No Known Allergies  Patient Measurements: Height: 6' (182.9 cm) Weight: (!) 411 lb 12.8 oz (186.791 kg) IBW/kg (Calculated) : 77.6 Adjusted Body Weight: 125 kg   Vital Signs: Temp: 98.3 F (36.8 C) (06/30 0328) Temp Source: Oral (06/30 0328) BP: 188/11 mmHg (06/30 0328) Pulse Rate: 103 (06/30 0328) Intake/Output from previous day:   Intake/Output from this shift:    Labs:  Recent Labs  01/03/15 0035  WBC 22.0*  HGB 15.2  PLT 164  CREATININE 1.22   Estimated Creatinine Clearance: 147.8 mL/min (by C-G formula based on Cr of 1.22). No results for input(s): VANCOTROUGH, VANCOPEAK, VANCORANDOM, GENTTROUGH, GENTPEAK, GENTRANDOM, TOBRATROUGH, TOBRAPEAK, TOBRARND, AMIKACINPEAK, AMIKACINTROU, AMIKACIN in the last 72 hours.   Microbiology: No results found for this or any previous visit (from the past 720 hour(s)).  Medical History: Past Medical History  Diagnosis Date  . Hypertension   . GERD (gastroesophageal reflux disease)     Medications:  Prescriptions prior to admission  Medication Sig Dispense Refill Last Dose  . ibuprofen (ADVIL,MOTRIN) 400 MG tablet Take 400 mg by mouth every 6 (six) hours as needed.   01/02/2015 at 1800  . amLODipine (NORVASC) 5 MG tablet Take 5 mg by mouth daily.   08/06/2013 at Unknown time  . atenolol (TENORMIN) 100 MG tablet Take 100 mg by mouth daily.   08/06/2013 at 1100  . esomeprazole (NEXIUM) 20 MG capsule Take 20 mg by mouth daily as needed (for acid reflux).   Past Week at Unknown time  . naproxen (NAPROSYN) 500 MG tablet Take 1 tablet (500 mg total) by mouth 2 (two) times daily. 20 tablet 0   . Naproxen Sodium (ALEVE PO) Take 2 tablets by mouth daily as needed (for pain).   08/06/2013 at Unknown time  . oxyCODONE-acetaminophen (PERCOCET/ROXICET) 5-325 MG per tablet Take 1-2 tablets by mouth every 6 (six) hours as needed for severe  pain. 15 tablet 0   . tamsulosin (FLOMAX) 0.4 MG CAPS capsule Take 1 capsule (0.4 mg total) by mouth daily. 7 capsule 0    Assessment: 34 y.o. male with RLE swelling, fever/tachycardia, possible sepsis, for empiric antibiotics  Vancomycin 1 g IV given in ED at 0120  Goal of Therapy:  Vancomycin trough level 15-20 mcg/ml  Plan:  Vancomycin 1500 mg IV q8h Zosyn 3.375 g IV q8h    Justin Mason, Justin Mason 01/03/2015,5:55 AM

## 2015-01-03 NOTE — Progress Notes (Signed)
PATIENT DETAILS Name: Justin Mason Age: 34 y.o. Sex: male Date of Birth: April 20, 1981 Admit Date: 01/02/2015 Admitting Physician Lorretta Harp, MD ZOX:WRUEAV,WUJWJX Joelene Millin, MD  Subjective: Essentially unchanged. However cellulitic area and right lower leg appears to be improved.  Assessment/Plan: Principal Problem: Sepsis: Secondary to right lower extremity cellulitis. Continue IV fluids and IV antibiotics, follow cultures. Some improvement in sepsis pathophysiology. Follow fever curve.  Active Problems: Right lower extremity cellulitis: Somewhat improved-2 areas of cellulitis one near the right knee, and 1 in the right lower leg. Area in the right lower leg seems to have improved. Leg is soft, compartments are not tense. Continue antibiotics-since I doubt any tickborne illness-will discontinue doxycycline. and follow fever curve. Right lower extremity Doppler negative on 6/29.  Hypertension: BP controlled this morning-continue with amlodipine, atenolol-follow and adjust accordingly.  GERD (gastroesophageal reflux disease): Continue PPI  Morbid obesity: Counseled regarding importance of weight loss  Disposition: Remain inpatient  Antimicrobial agents  See below  Anti-infectives    Start     Dose/Rate Route Frequency Ordered Stop   01/03/15 2200  doxycycline (VIBRA-TABS) tablet 100 mg     100 mg Oral Every 12 hours 01/03/15 0911     01/03/15 0700  doxycycline (VIBRAMYCIN) 100 mg in dextrose 5 % 250 mL IVPB  Status:  Discontinued     100 mg 125 mL/hr over 120 Minutes Intravenous 2 times daily 01/03/15 0544 01/03/15 0910   01/03/15 0700  vancomycin (VANCOCIN) 1,500 mg in sodium chloride 0.9 % 500 mL IVPB     1,500 mg 250 mL/hr over 120 Minutes Intravenous 3 times per day 01/03/15 0606     01/03/15 0700  piperacillin-tazobactam (ZOSYN) IVPB 3.375 g     3.375 g 12.5 mL/hr over 240 Minutes Intravenous 3 times per day 01/03/15 0606     01/03/15 0545   piperacillin-tazobactam (ZOSYN) IVPB 3.375 g  Status:  Discontinued     3.375 g 100 mL/hr over 30 Minutes Intravenous  Once 01/03/15 0544 01/03/15 0600   01/03/15 0545  vancomycin (VANCOCIN) IVPB 1000 mg/200 mL premix  Status:  Discontinued     1,000 mg 200 mL/hr over 60 Minutes Intravenous  Once 01/03/15 0544 01/03/15 0600   01/03/15 0115  piperacillin-tazobactam (ZOSYN) IVPB 3.375 g     3.375 g 12.5 mL/hr over 240 Minutes Intravenous  Once 01/03/15 0111 01/03/15 0202   01/03/15 0000  vancomycin (VANCOCIN) IVPB 1000 mg/200 mL premix     1,000 mg 200 mL/hr over 60 Minutes Intravenous  Once 01/02/15 2355 01/03/15 0223      DVT Prophylaxis: Prophylactic Lovenox   Code Status: Full code   Family Communication Mother at bedside  Procedures: None  CONSULTS:  None  Time spent 35 minutes-Greater than 50% of this time was spent in counseling, explanation of diagnosis, planning of further management, and coordination of care.  MEDICATIONS: Scheduled Meds: . amLODipine  5 mg Oral Daily  . atenolol  100 mg Oral Daily  . doxycycline  100 mg Oral Q12H  . enoxaparin (LOVENOX) injection  90 mg Subcutaneous Daily  . folic acid  1 mg Oral Daily  . LORazepam  0-4 mg Oral 4 times per day   Followed by  . [START ON 01/05/2015] LORazepam  0-4 mg Oral Q12H  . multivitamin with minerals  1 tablet Oral Daily  . pantoprazole  40 mg Oral Daily  . piperacillin-tazobactam (ZOSYN)  IV  3.375 g Intravenous 3 times per day  . thiamine  100 mg Oral Daily  . vancomycin  1,500 mg Intravenous 3 times per day   Continuous Infusions: . sodium chloride 125 mL/hr at 01/03/15 0616   PRN Meds:.acetaminophen **OR** acetaminophen, hydrALAZINE, LORazepam **OR** LORazepam, morphine injection, ondansetron **OR** ondansetron (ZOFRAN) IV    PHYSICAL EXAM: Vital signs in last 24 hours: Filed Vitals:   01/03/15 0755 01/03/15 0827 01/03/15 0945 01/03/15 1200  BP: 205/86 195/86  126/51  Pulse: 112   85    Temp: 99.1 F (37.3 C)  101.2 F (38.4 C)   TempSrc: Oral  Oral   Resp:    20  Height:      Weight:      SpO2: 81% 97%  99%    Weight change:  Filed Weights   01/03/15 0328  Weight: 186.791 kg (411 lb 12.8 oz)   Body mass index is 55.84 kg/(m^2).   Gen Exam: Awake and alert with clear speech.   Neck: Supple, No JVD.   Chest: B/L Clear.   CVS: S1 S2 Regular, no murmurs.  Abdomen: soft, BS +, non tender, non distended.  Extremities: no edema, lower extremities warm to touch. See pictures below regarding right lower extremity  Cellulitis area Right leg-near ankle   Right leg-ankle area-lateral aspect-erythema resolved    RLE-near knee  Neurologic: Non Focal.   Skin: No Rash.   Wounds: N/A.   Intake/Output from previous day:  Intake/Output Summary (Last 24 hours) at 01/03/15 1245 Last data filed at 01/03/15 0952  Gross per 24 hour  Intake 1810.42 ml  Output   1125 ml  Net 685.42 ml     LAB RESULTS: CBC  Recent Labs Lab 01/03/15 0035 01/03/15 0711  WBC 22.0* 18.0*  HGB 15.2 14.9  HCT 43.5 41.8  PLT 164 145*  MCV 86.7 85.8  MCH 30.3 30.6  MCHC 34.9 35.6  RDW 12.9 12.7  LYMPHSABS 1.2 1.3  MONOABS 1.5* 1.3*  EOSABS 0.0 0.0  BASOSABS 0.0 0.0    Chemistries   Recent Labs Lab 01/03/15 0035 01/03/15 0711  NA 136 137  K 3.5 4.0  CL 100* 101  CO2 24 26  GLUCOSE 173* 129*  BUN 11 9  CREATININE 1.22 1.36*  CALCIUM 9.6 8.8*    CBG: No results for input(s): GLUCAP in the last 168 hours.  GFR Estimated Creatinine Clearance: 132.5 mL/min (by C-G formula based on Cr of 1.36).  Coagulation profile  Recent Labs Lab 01/03/15 0711  INR 1.23    Cardiac Enzymes No results for input(s): CKMB, TROPONINI, MYOGLOBIN in the last 168 hours.  Invalid input(s): CK  Invalid input(s): POCBNP No results for input(s): DDIMER in the last 72 hours. No results for input(s): HGBA1C in the last 72 hours. No results for input(s): CHOL, HDL, LDLCALC,  TRIG, CHOLHDL, LDLDIRECT in the last 72 hours. No results for input(s): TSH, T4TOTAL, T3FREE, THYROIDAB in the last 72 hours.  Invalid input(s): FREET3 No results for input(s): VITAMINB12, FOLATE, FERRITIN, TIBC, IRON, RETICCTPCT in the last 72 hours. No results for input(s): LIPASE, AMYLASE in the last 72 hours.  Urine Studies No results for input(s): UHGB, CRYS in the last 72 hours.  Invalid input(s): UACOL, UAPR, USPG, UPH, UTP, UGL, UKET, UBIL, UNIT, UROB, ULEU, UEPI, UWBC, URBC, UBAC, CAST, UCOM, BILUA  MICROBIOLOGY: Recent Results (from the past 240 hour(s))  Culture, blood (routine x 2)     Status: None (Preliminary result)  Collection Time: 01/03/15 12:35 AM  Result Value Ref Range Status   Specimen Description BLOOD RIGHT HAND  Final   Special Requests   Final    BOTTLES DRAWN AEROBIC AND ANAEROBIC 8CC Performed at [facility]    Culture PENDING  Incomplete   Report Status PENDING  Incomplete    RADIOLOGY STUDIES/RESULTS: No results found.  Jeoffrey MassedGHIMIRE,SHANKER, MD  [facility] Pager:[phone]  If 7PM-7AM, please contact night-coverage [email or web address] Password [email or web address] 01/03/2015, 12:45 PM   LOS: 0 days

## 2015-01-03 NOTE — Progress Notes (Signed)
   01/03/15 0618  Vitals  BP (!) 188/91 mmHg  BP Location Left Arm  BP Method Automatic  Patient Position (if appropriate) Lying  Pulse Rate 95  Pulse Rate Source Dinamap  Oxygen Therapy  SpO2 99 %  O2 Device Room Air  Pt's BP elevated, 5mg  hydralazine IV given per order.

## 2015-01-03 NOTE — H&P (Signed)
[facility] History and Physical  Justin MedalJohn L Sabas UJW:[id number]:[id number] DOB: [birth year] DOA: 01/02/2015  Referring physician: Dr. Elesa MassedWard. Patient was transferred from [facility] [place]. PCP: Kaleen MaskELKINS,WILSON Dimare, MD  Specialists: None.  Chief Complaint: Right leg swelling pain and fever.  HPI: Justin Mason is a 34 y.o. male with history of hypertension, morbid obesity and GERD presents to the ER because of increasing pain swelling in the right lower extremity with fever and chills. Patient states his right lower extremity swelling and erythema started at the posterior aspect of his right knee which gradually started spreading more distally and proximally. Patient yesterday started developing fever and chills with diaphoresis. In the ER patient was found to be tachycardic and febrile and prior to arrival patient had Dopplers of the lower extremity ordered by his PCP which as per the information provided was negative for DVT (please confirm with the official report). Patient otherwise denies any nausea vomiting abdominal pain diarrhea chest pain or shortness of breath. Patient states he may have missed his antihypertensives. Patient also states that he has had some tick bites last month otherwise denies any trauma or recent insect bites. Patient's wife has had MRSA infection of her skin recently and was treated.   Review of Systems: As presented in the history of presenting illness, rest negative.  Past Medical History  Diagnosis Date  . Hypertension   . GERD (gastroesophageal reflux disease)    Past Surgical History  Procedure Laterality Date  . Incision and drainage foot  1991-1992    STEPPED ON NAIL / INFECTED WOUND  . Shoulder surgery      September 08, 2011   Social History:  reports that he has never smoked. His smokeless tobacco use includes Snuff. He reports that he drinks about 4.2 oz of alcohol per week. He reports that he does not use illicit drugs. Where does patient live home. Can  patient participate in ADLs? Yes.  No Known Allergies  Family History:  Family History  Problem Relation Age of Onset  . Anesthesia problems Sister       Prior to Admission medications   Medication Sig Start Date End Date Taking? Authorizing Provider  ibuprofen (ADVIL,MOTRIN) 400 MG tablet Take 400 mg by mouth every 6 (six) hours as needed.   Yes Historical Provider, MD  amLODipine (NORVASC) 5 MG tablet Take 5 mg by mouth daily.    Historical Provider, MD  atenolol (TENORMIN) 100 MG tablet Take 100 mg by mouth daily.    Historical Provider, MD  esomeprazole (NEXIUM) 20 MG capsule Take 20 mg by mouth daily as needed (for acid reflux).    Historical Provider, MD  naproxen (NAPROSYN) 500 MG tablet Take 1 tablet (500 mg total) by mouth 2 (two) times daily. 08/07/13   Renne CriglerJoshua Geiple, PA-C  Naproxen Sodium (ALEVE PO) Take 2 tablets by mouth daily as needed (for pain).    Historical Provider, MD  oxyCODONE-acetaminophen (PERCOCET/ROXICET) 5-325 MG per tablet Take 1-2 tablets by mouth every 6 (six) hours as needed for severe pain. 08/07/13   Renne CriglerJoshua Geiple, PA-C  tamsulosin (FLOMAX) 0.4 MG CAPS capsule Take 1 capsule (0.4 mg total) by mouth daily. 08/07/13   Renne CriglerJoshua Geiple, PA-C    Physical Exam: Filed Vitals:   01/03/15 0118 01/03/15 0134 01/03/15 0201 01/03/15 0328  BP:  159/84 168/89 188/11  Pulse:  102 106 103  Temp: 102.8 F (39.3 C)  100.2 F (37.9 C) 98.3 F (36.8 C)  TempSrc:   Oral  Oral  Resp:  Height:    6' (1.829 m)  Weight:    186.791 kg (411 lb 12.8 oz)  SpO2:  98% 98% 97%     General:  Obese not in distress.  Eyes: Anicteric no pallor.  ENT: No discharge from ears eyes nose and mouth.  Neck: No mass felt.  Cardiovascular: S1 and S2 heard.  Respiratory: No rhonchi or crepitations.  Abdomen: Soft nontender bowel sounds present.  Skin: Erythema around the medial posterior aspect of the right knee with no effusion of the right knee joint. There is also erythema  involving the right leg distal aspect with some induration the possible thigh area.  Musculoskeletal: Mild swelling of the right leg. Patient has no restriction of movement of the lower extremities.  Psychiatric: Appears normal.  Neurologic: Alert awake oriented to time place and person. Moves all extremities.  Labs on Admission:  Basic Metabolic Panel:  Recent Labs Lab 01/03/15 0035  NA 136  K 3.5  CL 100*  CO2 24  GLUCOSE 173*  BUN 11  CREATININE 1.22  CALCIUM 9.6   Liver Function Tests: No results for input(s): AST, ALT, ALKPHOS, BILITOT, PROT, ALBUMIN in the last 168 hours. No results for input(s): LIPASE, AMYLASE in the last 168 hours. No results for input(s): AMMONIA in the last 168 hours. CBC:  Recent Labs Lab 01/03/15 0035  WBC 22.0*  NEUTROABS 19.3*  HGB 15.2  HCT 43.5  MCV 86.7  PLT 164   Cardiac Enzymes: No results for input(s): CKTOTAL, CKMB, CKMBINDEX, TROPONINI in the last 168 hours.  BNP (last 3 results) No results for input(s): BNP in the last 8760 hours.  ProBNP (last 3 results) No results for input(s): PROBNP in the last 8760 hours.  CBG: No results for input(s): GLUCAP in the last 168 hours.  Radiological Exams on Admission: No results found.  EKG: Independently reviewed. Sinus tachycardia.  Assessment/Plan Principal Problem:   Sepsis Active Problems:   Hypertension   GERD (gastroesophageal reflux disease)   Cellulitis of right lower extremity   Essential hypertension   1. Sepsis from right lower extremity cellulitis - at this time patient is able to move his right lower extremity without any restriction and that is no definite signs to indicate compartment syndrome. I have continued patient on vancomycin and Zosyn and also added doxycycline since patient states he has had a recent tick bites for same reason I have also ordered RMSF and Lyme titers. Keep right leg elevated. Continue with hydration and I have placed patient on sepsis  protocol. Follow lactic acid and procalcitonin levels. Dopplers of the right lower extremity was negative as per the reports please follow the official report. 2. Hypertension - continue amlodipine and metoprolol patient states he may have missed his medication. 3. Hyperglycemia - check hemoglobin A1c. 4. GERD - continue PPI.  Patient states he drinks alcohol everyday for which I have placed patient on alcohol withdrawal protocol.   DVT Prophylaxis Lovenox.  Code Status: Full code.  Family Communication: Discussed with patient's wife.  Disposition Plan: Admit to inpatient.    Chyenne Sobczak N. [facility] Pager [phone].  If 7PM-7AM, please contact night-coverage [email or web address] Password [facility] 01/03/2015, 5:45 AM

## 2015-01-04 DIAGNOSIS — K219 Gastro-esophageal reflux disease without esophagitis: Secondary | ICD-10-CM

## 2015-01-04 DIAGNOSIS — I10 Essential (primary) hypertension: Secondary | ICD-10-CM

## 2015-01-04 LAB — BASIC METABOLIC PANEL
ANION GAP: 9 (ref 5–15)
BUN: 7 mg/dL (ref 6–20)
CHLORIDE: 106 mmol/L (ref 101–111)
CO2: 23 mmol/L (ref 22–32)
CREATININE: 1.26 mg/dL — AB (ref 0.61–1.24)
Calcium: 8.5 mg/dL — ABNORMAL LOW (ref 8.9–10.3)
GFR calc Af Amer: >60 mL/min (ref >60)
GLUCOSE: 120 mg/dL — AB (ref 65–99)
Potassium: 3.6 mmol/L (ref 3.5–5.1)
Sodium: 138 mmol/L (ref 135–145)

## 2015-01-04 LAB — CBC
HEMATOCRIT: 38 % — AB (ref 39.0–52.0)
Hemoglobin: 13 g/dL (ref 13.0–17.0)
MCH: 29.3 pg (ref 26.0–34.0)
MCHC: 34.2 g/dL (ref 30.0–36.0)
MCV: 85.8 fL (ref 78.0–100.0)
Platelets: 132 10*3/uL — ABNORMAL LOW (ref 150–400)
RBC: 4.43 MIL/uL (ref 4.22–5.81)
RDW: 12.7 % (ref 11.5–15.5)
WBC: 9.1 10*3/uL (ref 4.0–10.5)

## 2015-01-04 LAB — B. BURGDORFI ANTIBODIES: B burgdorferi Ab IgG+IgM: <0.91 {ISR} (ref 0.00–0.90)

## 2015-01-04 LAB — HEMOGLOBIN A1C
HEMOGLOBIN A1C: 5.4 % (ref 4.8–5.6)
Mean Plasma Glucose: 108 mg/dL

## 2015-01-04 NOTE — Progress Notes (Signed)
UR completed.    Ariyel Jeangilles W. Cayman Brogden, RN, BSN  Trauma/Neuro ICU Case Manager [phone] 

## 2015-01-04 NOTE — Progress Notes (Signed)
PATIENT DETAILS Name: Justin Mason Age: 34 y.o. Sex: male Date of Birth: 1981-02-12 Admit Date: 01/02/2015 Admitting Physician Lorretta Harp, MD ZOX:WRUEAV,WUJWJX Joelene Millin, MD  Subjective: Awake and alert. Continues to have fever. Erythema in lower ext improving.   Assessment/Plan: Principal Problem: Sepsis: Secondary to right lower extremity cellulitis. Continue IV fluids and IV Vanco/Zosyn, follow cultures. Some improvement in sepsis pathophysiology. Follow fever curve - this morning minor increase to 100.7 deg F, now back to 97.7deg F.  Active Problems: Right lower extremity cellulitis: Area on medial/posterior right knee much improved today, right lower leg has slightly decreased in size while also becoming a deeper red color which concerned his mother, she was reassured when we discussed.  Leg is soft, compartments are not tense. Continue antibiotics and follow fever curve. Right lower extremity Doppler negative on 6/29.  Hypertension: BP 125/90 this morning-continue with amlodipine, atenolol-follow and adjust accordingly.  GERD (gastroesophageal reflux disease): Continue PPI  Morbid obesity: Counseled regarding importance of weight loss  ?ETOH abuse:per patient-he only drinks socially-1-2 days/week. No signs of withdrawal. Follow  Disposition: Remain inpatient-home in 2-3 days  Antimicrobial agents  See below  Anti-infectives    Start     Dose/Rate Route Frequency Ordered Stop   01/03/15 2200  doxycycline (VIBRA-TABS) tablet 100 mg  Status:  Discontinued     100 mg Oral Every 12 hours 01/03/15 0911 01/03/15 1251   01/03/15 0700  doxycycline (VIBRAMYCIN) 100 mg in dextrose 5 % 250 mL IVPB  Status:  Discontinued     100 mg 125 mL/hr over 120 Minutes Intravenous 2 times daily 01/03/15 0544 01/03/15 0910   01/03/15 0700  vancomycin (VANCOCIN) 1,500 mg in sodium chloride 0.9 % 500 mL IVPB     1,500 mg 250 mL/hr over 120 Minutes Intravenous 3 times per day  01/03/15 0606     01/03/15 0700  piperacillin-tazobactam (ZOSYN) IVPB 3.375 g     3.375 g 12.5 mL/hr over 240 Minutes Intravenous 3 times per day 01/03/15 0606     01/03/15 0545  piperacillin-tazobactam (ZOSYN) IVPB 3.375 g  Status:  Discontinued     3.375 g 100 mL/hr over 30 Minutes Intravenous  Once 01/03/15 0544 01/03/15 0600   01/03/15 0545  vancomycin (VANCOCIN) IVPB 1000 mg/200 mL premix  Status:  Discontinued     1,000 mg 200 mL/hr over 60 Minutes Intravenous  Once 01/03/15 0544 01/03/15 0600   01/03/15 0115  piperacillin-tazobactam (ZOSYN) IVPB 3.375 g     3.375 g 12.5 mL/hr over 240 Minutes Intravenous  Once 01/03/15 0111 01/03/15 0202   01/03/15 0000  vancomycin (VANCOCIN) IVPB 1000 mg/200 mL premix     1,000 mg 200 mL/hr over 60 Minutes Intravenous  Once 01/02/15 2355 01/03/15 0223      DVT Prophylaxis: Prophylactic Lovenox   Code Status: Full code   Family Communication Mother at bedside  Procedures: None  CONSULTS:  None  Time spent 30 minutes-Greater than 50% of this time was spent in counseling, explanation of diagnosis, planning of further management, and coordination of care.  MEDICATIONS: Scheduled Meds: . amLODipine  5 mg Oral Daily  . atenolol  100 mg Oral Daily  . enoxaparin (LOVENOX) injection  90 mg Subcutaneous Daily  . folic acid  1 mg Oral Daily  . LORazepam  0-4 mg Oral 4 times per day   Followed by  . [START ON 01/05/2015] LORazepam  0-4  mg Oral Q12H  . multivitamin with minerals  1 tablet Oral Daily  . pantoprazole  40 mg Oral Daily  . piperacillin-tazobactam (ZOSYN)  IV  3.375 g Intravenous 3 times per day  . thiamine  100 mg Oral Daily  . vancomycin  1,500 mg Intravenous 3 times per day   Continuous Infusions:   PRN Meds:.acetaminophen **OR** acetaminophen, hydrALAZINE, LORazepam **OR** LORazepam, morphine injection, ondansetron **OR** ondansetron (ZOFRAN) IV    PHYSICAL EXAM: Vital signs in last 24 hours: Filed Vitals:    01/04/15 0028 01/04/15 0544 01/04/15 0700 01/04/15 1002  BP: 165/90 147/79  152/90  Pulse: 83 89  66  Temp: 98.4 F (36.9 C) 100.7 F (38.2 C) 98.7 F (37.1 C) 97.7 F (36.5 C)  TempSrc: Oral Oral Oral Oral  Resp: 20 20  20   Height:      Weight:  187.336 kg (413 lb)    SpO2: 100% 97%  97%    Weight change: 0.544 kg (1 lb 3.2 oz) Filed Weights   01/03/15 0328 01/04/15 0544  Weight: 186.791 kg (411 lb 12.8 oz) 187.336 kg (413 lb)   Body mass index is 56 kg/(m^2).   Gen Exam: Awake and alert with clear speech.   Neck: Supple, No JVD.   Chest: B/L Clear, no wheezing, crackles or rales.   CVS: S1 S2 Regular, no murmurs.  Abdomen: soft, BS +, non tender, non distended.  Extremities: no edema, lower extremities warm to touch. See pictures from today below regarding right lower extremity  Cellulitis area Right leg-near ankle   Right leg-ankle area-lateral aspect-erythema resolved   RLE-near knee (resolved)   Neurologic: Non Focal.   Skin: No Rash.   Wounds: N/A.   Intake/Output from previous day:  Intake/Output Summary (Last 24 hours) at 01/04/15 1207 Last data filed at 01/04/15 0837  Gross per 24 hour  Intake 5166.25 ml  Output   3725 ml  Net 1441.25 ml     LAB RESULTS: CBC  Recent Labs Lab 01/03/15 0035 01/03/15 0711 01/04/15 0505  WBC 22.0* 18.0* 9.1  HGB 15.2 14.9 13.0  HCT 43.5 41.8 38.0*  PLT 164 145* 132*  MCV 86.7 85.8 85.8  MCH 30.3 30.6 29.3  MCHC 34.9 35.6 34.2  RDW 12.9 12.7 12.7  LYMPHSABS 1.2 1.3  --   MONOABS 1.5* 1.3*  --   EOSABS 0.0 0.0  --   BASOSABS 0.0 0.0  --     Chemistries   Recent Labs Lab 01/03/15 0035 01/03/15 0711 01/04/15 0505  NA 136 137 138  K 3.5 4.0 3.6  CL 100* 101 106  CO2 24 26 23   GLUCOSE 173* 129* 120*  BUN 11 9 7   CREATININE 1.22 1.36* 1.26*  CALCIUM 9.6 8.8* 8.5*    CBG: No results for input(s): GLUCAP in the last 168 hours.  GFR Estimated Creatinine Clearance: 143.3 mL/min (by C-G formula  based on Cr of 1.26).  Coagulation profile  Recent Labs Lab 01/03/15 0711  INR 1.23    Cardiac Enzymes No results for input(s): CKMB, TROPONINI, MYOGLOBIN in the last 168 hours.  Invalid input(s): CK  Invalid input(s): POCBNP No results for input(s): DDIMER in the last 72 hours.  Recent Labs  01/03/15 0711  HGBA1C 5.4   No results for input(s): CHOL, HDL, LDLCALC, TRIG, CHOLHDL, LDLDIRECT in the last 72 hours. No results for input(s): TSH, T4TOTAL, T3FREE, THYROIDAB in the last 72 hours.  Invalid input(s): FREET3 No results for input(s): VITAMINB12, FOLATE,  FERRITIN, TIBC, IRON, RETICCTPCT in the last 72 hours. No results for input(s): LIPASE, AMYLASE in the last 72 hours.  Urine Studies No results for input(s): UHGB, CRYS in the last 72 hours.  Invalid input(s): UACOL, UAPR, USPG, UPH, UTP, UGL, UKET, UBIL, UNIT, UROB, ULEU, UEPI, UWBC, URBC, UBAC, CAST, UCOM, BILUA  MICROBIOLOGY: Recent Results (from the past 240 hour(s))  Culture, blood (routine x 2)     Status: None (Preliminary result)   Collection Time: 01/03/15 12:35 AM  Result Value Ref Range Status   Specimen Description BLOOD RIGHT HAND  Final   Special Requests   Final    BOTTLES DRAWN AEROBIC AND ANAEROBIC 8CC Performed at [facility]    Culture PENDING  Incomplete   Report Status PENDING  Incomplete    RADIOLOGY STUDIES/RESULTS: No results found.  Bevely Palmer PA-S  Attending MD note  Patient was seen, examined,treatment plan was discussed with the PA-S. I have personally reviewed the clinical findings, lab, imaging studies and management of this patient in detail. I agree with the documentation, as recorded PA-S  Improving-cellulitic area in the right knee area has almost resolved. Fever curve decreasing. Blood cultures still pending. Continue with empiric vancomycin and Zosyn.  Jeoffrey Massed MD  [facility] Pager:[phone]  If 7PM-7AM, please contact  night-coverage [email or web address] Password [email or web address] 01/04/2015, 12:07 PM   LOS: 1 day

## 2015-01-05 LAB — URINALYSIS, ROUTINE W REFLEX MICROSCOPIC
Bilirubin Urine: NEGATIVE
Glucose, UA: NEGATIVE mg/dL
KETONES UR: NEGATIVE mg/dL
NITRITE: NEGATIVE
PROTEIN: NEGATIVE mg/dL
SPECIFIC GRAVITY, URINE: 1.016 (ref 1.005–1.030)
UROBILINOGEN UA: 0.2 mg/dL (ref 0.0–1.0)
pH: 5.5 (ref 5.0–8.0)

## 2015-01-05 LAB — CBC
HCT: 38.9 % — ABNORMAL LOW (ref 39.0–52.0)
Hemoglobin: 13.3 g/dL (ref 13.0–17.0)
MCH: 29.4 pg (ref 26.0–34.0)
MCHC: 34.2 g/dL (ref 30.0–36.0)
MCV: 85.9 fL (ref 78.0–100.0)
Platelets: 123 K/uL — ABNORMAL LOW (ref 150–400)
RBC: 4.53 MIL/uL (ref 4.22–5.81)
RDW: 12.7 % (ref 11.5–15.5)
WBC: 8.1 K/uL (ref 4.0–10.5)

## 2015-01-05 LAB — BASIC METABOLIC PANEL WITH GFR
Anion gap: 9 (ref 5–15)
BUN: 6 mg/dL (ref 6–20)
CO2: 23 mmol/L (ref 22–32)
Calcium: 8.9 mg/dL (ref 8.9–10.3)
Chloride: 108 mmol/L (ref 101–111)
Creatinine, Ser: 1.28 mg/dL — ABNORMAL HIGH (ref 0.61–1.24)
GFR calc Af Amer: >60 mL/min
GFR calc non Af Amer: >60 mL/min
Glucose, Bld: 140 mg/dL — ABNORMAL HIGH (ref 65–99)
Potassium: 4.2 mmol/L (ref 3.5–5.1)
Sodium: 140 mmol/L (ref 135–145)

## 2015-01-05 LAB — URINE MICROSCOPIC-ADD ON

## 2015-01-05 LAB — VANCOMYCIN, TROUGH: Vancomycin Tr: 19 ug/mL (ref 10.0–20.0)

## 2015-01-05 NOTE — Progress Notes (Signed)
PATIENT DETAILS Name: Justin Mason Age: 34 y.o. Sex: male Date of Birth: 02-27-1981 Admit Date: 01/02/2015 Admitting Physician Lorretta Harp, MD ZOX:[id number] Joelene Millin, MD  Subjective: Feels much better. No fever yesterday  Assessment/Plan: Principal Problem: Sepsis: Secondary to right lower extremity cellulitis.Sepsis pathophysiology has resolved. Blood cultures negative.  Active Problems: Right lower extremity cellulitis: Much improved, continue IV Vanco, since cultures negative will discontinue IV Zosyn. Right lower extremity Doppler negative on 6/29.  Hypertension: Controlled with amlodipine, atenolol-follow and adjust accordingly.  GERD (gastroesophageal reflux disease): Continue PPI  Morbid obesity: Counseled regarding importance of weight loss  ?ETOH abuse:per patient-he only drinks socially-1-2 days/week. No signs of withdrawal. Follow  Disposition: Remain inpatient-home tomorrow-if clinical improvement continues.   Antimicrobial agents  See below  Anti-infectives    Start     Dose/Rate Route Frequency Ordered Stop   01/03/15 2200  doxycycline (VIBRA-TABS) tablet 100 mg  Status:  Discontinued     100 mg Oral Every 12 hours 01/03/15 0911 01/03/15 1251   01/03/15 0700  doxycycline (VIBRAMYCIN) 100 mg in dextrose 5 % 250 mL IVPB  Status:  Discontinued     100 mg 125 mL/hr over 120 Minutes Intravenous 2 times daily 01/03/15 0544 01/03/15 0910   01/03/15 0700  vancomycin (VANCOCIN) 1,500 mg in sodium chloride 0.9 % 500 mL IVPB     1,500 mg 250 mL/hr over 120 Minutes Intravenous 3 times per day 01/03/15 0606     01/03/15 0700  piperacillin-tazobactam (ZOSYN) IVPB 3.375 g     3.375 g 12.5 mL/hr over 240 Minutes Intravenous 3 times per day 01/03/15 0606     01/03/15 0545  piperacillin-tazobactam (ZOSYN) IVPB 3.375 g  Status:  Discontinued     3.375 g 100 mL/hr over 30 Minutes Intravenous  Once 01/03/15 0544 01/03/15 0600   01/03/15 0545  vancomycin  (VANCOCIN) IVPB 1000 mg/200 mL premix  Status:  Discontinued     1,000 mg 200 mL/hr over 60 Minutes Intravenous  Once 01/03/15 0544 01/03/15 0600   01/03/15 0115  piperacillin-tazobactam (ZOSYN) IVPB 3.375 g     3.375 g 12.5 mL/hr over 240 Minutes Intravenous  Once 01/03/15 0111 01/03/15 0202   01/03/15 0000  vancomycin (VANCOCIN) IVPB 1000 mg/200 mL premix     1,000 mg 200 mL/hr over 60 Minutes Intravenous  Once 01/02/15 2355 01/03/15 0223      DVT Prophylaxis: Prophylactic Lovenox   Code Status: Full code   Family Communication Mother at bedside  Procedures: None  CONSULTS:  None  Time spent 20  minutes-Greater than 50% of this time was spent in counseling, explanation of diagnosis, planning of further management, and coordination of care.  MEDICATIONS: Scheduled Meds: . amLODipine  5 mg Oral Daily  . atenolol  100 mg Oral Daily  . enoxaparin (LOVENOX) injection  90 mg Subcutaneous Daily  . folic acid  1 mg Oral Daily  . LORazepam  0-4 mg Oral Q12H  . multivitamin with minerals  1 tablet Oral Daily  . pantoprazole  40 mg Oral Daily  . piperacillin-tazobactam (ZOSYN)  IV  3.375 g Intravenous 3 times per day  . thiamine  100 mg Oral Daily  . vancomycin  1,500 mg Intravenous 3 times per day   Continuous Infusions:   PRN Meds:.acetaminophen **OR** acetaminophen, hydrALAZINE, LORazepam **OR** LORazepam, morphine injection, ondansetron **OR** ondansetron (ZOFRAN) IV    PHYSICAL EXAM: Vital signs in last  24 hours: Filed Vitals:   01/04/15 2127 01/05/15 0210 01/05/15 0550 01/05/15 1134  BP: 157/97 153/75 155/90 155/70  Pulse: 81  61   Temp: 98.7 F (37.1 C) 98.6 F (37 C) 97.9 F (36.6 C)   TempSrc: Oral Oral Oral   Resp: Height:      Weight:      SpO2: 96% 95% 100% 99%    Weight change:  Filed Weights   01/03/15 0328 01/04/15 0544  Weight: 186.791 kg (411 lb 12.8 oz) 187.336 kg (413 lb)   Body mass index is 56 kg/(m^2).   Gen Exam: Awake  and alert with clear speech.   Neck: Supple, No JVD.   Chest: B/L Clear, no wheezing, crackles or rales.   CVS: S1 S2 Regular, no murmurs.  Abdomen: soft, BS +, non tender, non distended.  Extremities: no edema, lower extremities warm to touch. See pictures from today below regarding right lower extremity  Cellulitis area Right leg-near ankle   Right leg-ankle area-lateral aspect-erythema resolved   RLE-near knee (resolved)   Neurologic: Non Focal.   Skin: No Rash.   Wounds: N/A.   Intake/Output from previous day:  Intake/Output Summary (Last 24 hours) at 01/05/15 1241 Last data filed at 01/05/15 1100  Gross per 24 hour  Intake   2300 ml  Output   3550 ml  Net  -1250 ml     LAB RESULTS: CBC  Recent Labs Lab 01/03/15 0035 01/03/15 0711 01/04/15 0505 01/05/15 0432  WBC 22.0* 18.0* 9.1 8.1  HGB 15.2 14.9 13.0 13.3  HCT 43.5 41.8 38.0* 38.9*  PLT 164 145* 132* 123*  MCV 86.7 85.8 85.8 85.9  MCH 30.3 30.6 29.3 29.4  MCHC 34.9 35.6 34.2 34.2  RDW 12.9 12.7 12.7 12.7  LYMPHSABS 1.2 1.3  --   --   MONOABS 1.5* 1.3*  --   --   EOSABS 0.0 0.0  --   --   BASOSABS 0.0 0.0  --   --     Chemistries   Recent Labs Lab 01/03/15 0035 01/03/15 0711 01/04/15 0505 01/05/15 0432  NA 136 137 138 140  K 3.5 4.0 3.6 4.2  CL 100* 101 106 108  CO2 GLUCOSE 173* 129* 120* 140*  BUN CREATININE 1.22 1.36* 1.26* 1.28*  CALCIUM 9.6 8.8* 8.5* 8.9    CBG: No results for input(s): GLUCAP in the last 168 hours.  GFR Estimated Creatinine Clearance: 141.1 mL/min (by C-G formula based on Cr of 1.28).  Coagulation profile  Recent Labs Lab 01/03/15 0711  INR 1.23    Cardiac Enzymes No results for input(s): CKMB, TROPONINI, MYOGLOBIN in the last 168 hours.  Invalid input(s): CK  Invalid input(s): POCBNP No results for input(s): DDIMER in the last 72 hours.  Recent Labs  01/03/15 0711  HGBA1C 5.4   No results for input(s): CHOL, HDL,  LDLCALC, TRIG, CHOLHDL, LDLDIRECT in the last 72 hours. No results for input(s): TSH, T4TOTAL, T3FREE, THYROIDAB in the last 72 hours.  Invalid input(s): FREET3 No results for input(s): VITAMINB12, FOLATE, FERRITIN, TIBC, IRON, RETICCTPCT in the last 72 hours. No results for input(s): LIPASE, AMYLASE in the last 72 hours.  Urine Studies No results for input(s): UHGB, CRYS in the last 72 hours.  Invalid input(s): UACOL, UAPR, USPG, UPH, UTP, UGL, UKET, UBIL, UNIT, UROB, ULEU, UEPI, UWBC, URBC, UBAC, CAST, UCOM, BILUA  MICROBIOLOGY: Recent Results (from  the past 240 hour(s))  Culture, blood (routine x 2)     Status: None (Preliminary result)   Collection Time: 01/03/15 12:35 AM  Result Value Ref Range Status   Specimen Description BLOOD RIGHT HAND  Final   Special Requests BOTTLES DRAWN AEROBIC AND ANAEROBIC 8CC  Final   Culture   Final    NO GROWTH 2 DAYS [facility]    Report Status PENDING  Incomplete  Culture, blood (routine x 2)     Status: None (Preliminary result)   Collection Time: 01/03/15  1:32 AM  Result Value Ref Range Status   Specimen Description BLOOD LEFT HAND  Final   Special Requests BOTTLES DRAWN AEROBIC AND ANAEROBIC 5CC EACH  Final   Culture   Final    NO GROWTH 2 DAYS [facility]    Report Status PENDING  Incomplete    RADIOLOGY STUDIES/RESULTS: No results found.  Jeoffrey MassedGhimire, Scotty Pinder MD  [facility] Pager:[phone]  If 7PM-7AM, please contact night-coverage [email or web address] Password [email or web address] 01/05/2015, 12:41 PM   LOS: 2 days

## 2015-01-05 NOTE — Progress Notes (Addendum)
ANTIBIOTIC CONSULT NOTE - FOLLOW UP  Pharmacy Consult for Vancocin and Zosyn Indication: sepsis 2/2 cellulitis   Labs:  Recent Labs  01/03/15 0711 01/04/15 0505 01/05/15 0432  WBC 18.0* 9.1 8.1  HGB 14.9 13.0 13.3  PLT 145* 132* 123*  CREATININE 1.36* 1.26* 1.28*   Estimated Creatinine Clearance: 141.1 mL/min (by C-G formula based on Cr of 1.28).  Recent Labs  01/05/15 0432  [facility]     Microbiology: Recent Results (from the past 720 hour(s))  Culture, blood (routine x 2)     Status: None (Preliminary result)   Collection Time: 01/03/15 12:35 AM  Result Value Ref Range Status   Specimen Description BLOOD RIGHT HAND  Final   Special Requests BOTTLES DRAWN AEROBIC AND ANAEROBIC 8CC  Final   Culture   Final    NO GROWTH 1 DAY [facility]    Report Status PENDING  Incomplete  Culture, blood (routine x 2)     Status: None (Preliminary result)   Collection Time: 01/03/15  1:32 AM  Result Value Ref Range Status   Specimen Description BLOOD LEFT HAND  Final   Special Requests BOTTLES DRAWN AEROBIC AND ANAEROBIC 5CC EACH  Final   Culture   Final    NO GROWTH 1 DAY [facility]formed [facility]    Report Status PENDING  Incomplete     Assessment/Plan:  34yo male therapeutic on vancomycin with initial dosing for sepsis 2/2 cellulitis; sepsis improving, may be able to decrease goal level.  Will continue vanc and Zosyn for now.  Vernard GamblesVeronda Montel Vanderhoof, PharmD, BCPS  01/05/2015,5:40 AM

## 2015-01-06 LAB — HIV ANTIBODY (ROUTINE TESTING W REFLEX): HIV Screen 4th Generation wRfx: NONREACTIVE

## 2015-01-06 MED ORDER — SACCHAROMYCES BOULARDII 250 MG PO CAPS: 250.0000 mg | ORAL_CAPSULE | Freq: Two times a day (BID) | ORAL | Status: DC

## 2015-01-06 MED ORDER — DOXYCYCLINE HYCLATE 50 MG PO CAPS: 100.0000 mg | ORAL_CAPSULE | Freq: Two times a day (BID) | ORAL | Status: DC

## 2015-01-06 NOTE — Discharge Summary (Signed)
PATIENT DETAILS Name: Justin Mason Age: 34 y.o. Sex: male Date of Birth: [birth year] MRN: [id number]. Admitting Physician: Lorretta Harp, MD WUJ:[id number] Joelene Millin, MD  Admit Date: 01/02/2015 Discharge date: 01/06/2015  Recommendations for Outpatient Follow-up:  1. Please follow blood cultures until final-negative at the time of discharge  2. Please repeat CBC and chemistries at next visit  PRIMARY DISCHARGE DIAGNOSIS:  Principal Problem:   Sepsis Active Problems:   Hypertension   GERD (gastroesophageal reflux disease)   Cellulitis of right lower extremity   Essential hypertension   Cellulitis of right leg      PAST MEDICAL HISTORY: Past Medical History  Diagnosis Date  . Hypertension   . GERD (gastroesophageal reflux disease)     DISCHARGE MEDICATIONS: Current Discharge Medication List    START taking these medications   Details  doxycycline (VIBRAMYCIN) 50 MG capsule Take 2 capsules (100 mg total) by mouth 2 (two) times daily. Qty: 16 capsule, Refills: 0    saccharomyces boulardii (FLORASTOR) 250 MG capsule Take 1 capsule (250 mg total) by mouth 2 (two) times daily. Qty: 60 capsule, Refills: 0      CONTINUE these medications which have NOT CHANGED   Details  amLODipine (NORVASC) 5 MG tablet Take 5 mg by mouth daily.    atenolol (TENORMIN) 100 MG tablet Take 100 mg by mouth daily.    esomeprazole (NEXIUM) 20 MG capsule Take 20 mg by mouth daily.     ibuprofen (ADVIL,MOTRIN) 400 MG tablet Take 400 mg by mouth every 6 (six) hours as needed (pain).     oxyCODONE-acetaminophen (PERCOCET/ROXICET) 5-325 MG per tablet Take 1-2 tablets by mouth every 6 (six) hours as needed for severe pain. Qty: 15 tablet, Refills: 0      STOP taking these medications     Naproxen Sodium (ALEVE PO)         ALLERGIES:  No Known Allergies  BRIEF HPI:  See H&P, Labs, Consult and Test reports for all details in brief, patient is a [age] male with history of morbid obesity,  hypertension, presented with fever and right leg cellulitis.  CONSULTATIONS:   None  PERTINENT RADIOLOGIC STUDIES: No results found.   PERTINENT LAB RESULTS: CBC:  Recent Labs  01/04/15 0505 01/05/15 0432  WBC 9.1 8.1  HGB 13.0 13.3  HCT 38.0* 38.9*  PLT 132* 123*   CMET CMP     Component Value Date/Time   NA 140 01/05/2015 0432   K 4.2 01/05/2015 0432   CL 108 01/05/2015 0432   CO2 23 01/05/2015 0432   GLUCOSE 140* 01/05/2015 0432   BUN 6 01/05/2015 0432   CREATININE 1.28* 01/05/2015 0432   CALCIUM 8.9 01/05/2015 0432   PROT 6.6 01/03/2015 0711   ALBUMIN 3.3* 01/03/2015 0711   AST 27 01/03/2015 0711   ALT 37 01/03/2015 0711   ALKPHOS 54 01/03/2015 0711   BILITOT 1.1 01/03/2015 0711   GFRNONAA >60 01/05/2015 0432   GFRAA >60 01/05/2015 0432    GFR Estimated Creatinine Clearance: 140 mL/min (by C-G formula based on Cr of 1.28). No results for input(s): LIPASE, AMYLASE in the last 72 hours. No results for input(s): CKTOTAL, CKMB, CKMBINDEX, TROPONINI in the last 72 hours. Invalid input(s): POCBNP No results for input(s): DDIMER in the last 72 hours. No results for input(s): HGBA1C in the last 72 hours. No results for input(s): CHOL, HDL, LDLCALC, TRIG, CHOLHDL, LDLDIRECT in the last 72 hours. No results for input(s): TSH, T4TOTAL, T3FREE, THYROIDAB in  the last 72 hours.  Invalid input(s): FREET3 No results for input(s): VITAMINB12, FOLATE, FERRITIN, TIBC, IRON, RETICCTPCT in the last 72 hours. Coags: No results for input(s): INR in the last 72 hours.  Invalid input(s): PT Microbiology: Recent Results (from the past 240 hour(s))  Culture, blood (routine x 2)     Status: None (Preliminary result)   Collection Time: 01/03/15 12:35 AM  Result Value Ref Range Status   Specimen Description BLOOD RIGHT HAND  Final   Special Requests BOTTLES DRAWN AEROBIC AND ANAEROBIC 8CC  Final   Culture   Final    NO GROWTH 2 DAYS Performed at [facility]     Report Status PENDING  Incomplete  Culture, blood (routine x 2)     Status: None (Preliminary result)   Collection Time: 01/03/15  1:32 AM  Result Value Ref Range Status   Specimen Description BLOOD LEFT HAND  Final   Special Requests BOTTLES DRAWN AEROBIC AND ANAEROBIC 5CC EACH  Final   Culture   Final    NO GROWTH 2 DAYS Performed at [facility]    Report Status PENDING  Incomplete     BRIEF HOSPITAL COURSE:  Sepsis: Secondary to right lower extremity cellulitis.Sepsis pathophysiology has resolved. Blood cultures negative so for. Significantly improved, all IV antibiotics will be discontinued, patient will be transitioned to doxycycline and discharged home today.  Active Problems: Right lower extremity cellulitis: Much improved-almost resolved (see picture below).patient was managed with intravenous vancomycin and briefly with intravenous Zosyn. Blood cultures negative.  Right lower extremity Doppler negative on 6/29. since significantly improved, with transition to doxycycline on discharge.   Hypertension: Controlled with amlodipine, atenolol-follow and adjust accordingly in the outpatient setting   GERD (gastroesophageal reflux disease): Continue PPI  Morbid obesity: Counseled regarding importance of weight loss  ?ETOH abuse:per patient-he only drinks socially-1-2 days/week. No signs of withdrawal during this hospitalization. Counseled.  TODAY-DAY OF DISCHARGE:  Subjective:   Jiraiya Mcewan today has no headache,no chest abdominal pain,no new weakness tingling or numbness, feels much better wants to go home today. *  Objective:   Blood pressure 138/79, pulse 61, temperature 98.4 F (36.9 C), temperature source Oral, resp. rate 20, height 6' (1.829 m), weight 185.2 kg (408 lb 4.7 oz), SpO2 99 %.  Intake/Output Summary (Last 24 hours) at 01/06/15 0958 Last data filed at 01/06/15 0829  Gross per 24 hour  Intake   1740 ml  Output   4650 ml  Net  -2910 ml   Filed  Weights   01/03/15 0328 01/04/15 0544 01/06/15 0530  Weight: 186.791 kg (411 lb 12.8 oz) 187.336 kg (413 lb) 185.2 kg (408 lb 4.7 oz)    Exam Awake Alert, Oriented *3, No new F.N deficits, Normal affect [place].AT,PERRAL Supple Neck,No JVD, No cervical lymphadenopathy appriciated.  Symmetrical Chest wall movement, Good air movement bilaterally, CTAB RRR,No Gallops,Rubs or new Murmurs, No Parasternal Heave +ve B.Sounds, Abd Soft, Non tender, No organomegaly appriciated, No rebound -guarding or rigidity. No Cyanosis, Clubbing or edema, No new Rash or bruise   DISCHARGE CONDITION: Stable  DISPOSITION: Home  DISCHARGE INSTRUCTIONS:    Activity:  As tolerated   Diet recommendation: Heart Healthy diet  Discharge Instructions    Diet - low sodium heart healthy    Complete by:  As directed      Increase activity slowly    Complete by:  As directed            Follow-up Information  Follow up with Kaleen MaskELKINS,WILSON Lobue, MD. Schedule an appointment as soon as possible for a visit in 1 week.   Specialty:  Family Medicine   Contact information:   [address] [place] [id number] [phone]      Total Time spent on discharge equals 45 minutes.  SignedJeoffrey Massed: GHIMIRE,SHANKER 01/06/2015 9:58 AM

## 2015-01-08 LAB — ROCKY MTN SPOTTED FVR ABS PNL(IGG+IGM)
RMSF IgG: POSITIVE — AB
RMSF IgM: 0.31 index (ref 0.00–0.89)

## 2015-01-08 LAB — CULTURE, BLOOD (ROUTINE X 2)
Culture: NO GROWTH
Culture: NO GROWTH

## 2015-01-08 LAB — RMSF, IGG, IFA: RMSF, IGG, IFA: <1:64 {titer}

## 2017-03-19 ENCOUNTER — Other Ambulatory Visit: Payer: Self-pay | Admitting: Family Medicine

## 2017-03-22 ENCOUNTER — Other Ambulatory Visit: Payer: Self-pay | Admitting: Family Medicine

## 2017-03-22 DIAGNOSIS — M79662 Pain in left lower leg: Secondary | ICD-10-CM

## 2020-05-22 DIAGNOSIS — E785 Hyperlipidemia, unspecified: Secondary | ICD-10-CM | POA: Insufficient documentation

## 2020-05-22 DIAGNOSIS — E1165 Type 2 diabetes mellitus with hyperglycemia: Secondary | ICD-10-CM | POA: Insufficient documentation

## 2020-05-23 DIAGNOSIS — E7800 Pure hypercholesterolemia, unspecified: Secondary | ICD-10-CM | POA: Insufficient documentation

## 2020-05-23 DIAGNOSIS — M1990 Unspecified osteoarthritis, unspecified site: Secondary | ICD-10-CM | POA: Insufficient documentation

## 2020-05-24 ENCOUNTER — Ambulatory Visit (INDEPENDENT_AMBULATORY_CARE_PROVIDER_SITE_OTHER): Payer: Managed Care, Other (non HMO) | Admitting: Cardiology

## 2020-05-24 ENCOUNTER — Other Ambulatory Visit: Payer: Self-pay

## 2020-05-24 ENCOUNTER — Encounter: Payer: Self-pay | Admitting: Cardiology

## 2020-05-24 VITALS — BP 168/105 | HR 83 | Ht 73.0 in | Wt 379.2 lb

## 2020-05-24 DIAGNOSIS — I10 Essential (primary) hypertension: Secondary | ICD-10-CM

## 2020-05-24 DIAGNOSIS — R011 Cardiac murmur, unspecified: Secondary | ICD-10-CM

## 2020-05-24 DIAGNOSIS — E1165 Type 2 diabetes mellitus with hyperglycemia: Secondary | ICD-10-CM | POA: Diagnosis not present

## 2020-05-24 DIAGNOSIS — E782 Mixed hyperlipidemia: Secondary | ICD-10-CM | POA: Diagnosis not present

## 2020-05-24 HISTORY — DX: Cardiac murmur, unspecified: R01.1

## 2020-05-24 HISTORY — DX: Morbid (severe) obesity due to excess calories: E66.01

## 2020-05-24 MED ORDER — AMLODIPINE BESYLATE 10 MG PO TABS: 10.0000 mg | ORAL_TABLET | Freq: Every day | ORAL | 12 refills | Status: DC

## 2020-05-24 NOTE — Patient Instructions (Signed)
Medication Instructions:  No medication changes. *If you need a refill on your cardiac medications before your next appointment, please call your pharmacy*   Lab Work: None ordered If you have labs (blood work) drawn today and your tests are completely normal, you will receive your results only by: Marland Kitchen MyChart Message (if you have MyChart) OR . A paper copy in the mail If you have any lab test that is abnormal or we need to change your treatment, we will call you to review the results.   Testing/Procedures: Your physician has requested that you have an echocardiogram. Echocardiography is a painless test that uses sound waves to create images of your heart. It provides your doctor with information about the size and shape of your heart and how well your heart's chambers and valves are working. This procedure takes approximately one hour. There are no restrictions for this procedure.     Follow-Up: At [facility], you and your health needs are our priority.  As part of our continuing mission to provide you with exceptional heart care, we have created designated Provider Care Teams.  These Care Teams include your primary Cardiologist (physician) and Advanced Practice Providers (APPs -  Physician Assistants and Nurse Practitioners) who all work together to provide you with the care you need, when you need it.  We recommend signing up for the patient portal called "MyChart".  Sign up information is provided on this After Visit Summary.  MyChart is used to connect with patients for Virtual Visits (Telemedicine).  Patients are able to view lab/test results, encounter notes, upcoming appointments, etc.  Non-urgent messages can be sent to your provider as well.   To learn more about what you can do with MyChart, go to [email or web address].    Your next appointment:   1 month(s)  The format for your next appointment:   In Person  Provider:   Belva Crome, MD   Other  Instructions  Blood Pressure Record Sheet To take your blood pressure, you will need a blood pressure machine. You can buy a blood pressure machine (blood pressure monitor) at your clinic, drug store, or online. When choosing one, consider:  An automatic monitor that has an arm cuff.  A cuff that wraps snugly around your upper arm. You should be able to fit only one finger between your arm and the cuff.  A device that stores blood pressure reading results.  Do not choose a monitor that measures your blood pressure from your wrist or finger. Follow your health care provider's instructions for how to take your blood pressure. To use this form:  Get one reading in the morning (a.m.) 1-2 hours after you take any medicines.  Get one reading in the evening (p.m.) before supper.  Take at least 2 readings with each blood pressure check. This makes sure the results are correct. Wait 1-2 minutes between measurements.  Write down the results in the spaces on this form.  Repeat this once a week, or as told by your health care provider.  Make a follow-up appointment with your health care provider to discuss the results. Blood pressure log Date: _______________________  a.m. _____________________(1st reading) _____________________(2nd reading)  p.m. _____________________(1st reading) _____________________(2nd reading) Date: _______________________  a.m. _____________________(1st reading) _____________________(2nd reading)  p.m. _____________________(1st reading) _____________________(2nd reading) Date: _______________________  a.m. _____________________(1st reading) _____________________(2nd reading)  p.m. _____________________(1st reading) _____________________(2nd reading) Date: _______________________  a.m. _____________________(1st reading) _____________________(2nd reading)  p.m. _____________________(1st reading) _____________________(2nd reading) Date:  _______________________  a.m. _____________________(1st reading)  _____________________(2nd reading)  p.m. _____________________(1st reading) _____________________(2nd reading) This information is not intended to replace advice given to you by your health care provider. Make sure you discuss any questions you have with your health care provider. Document Revised: 08/20/2017 Document Reviewed: 06/22/2017 Elsevier Patient Education  2020 Elsevier Inc. Amlodipine Oral Tablets What is this medicine? AMLODIPINE (am LOE di peen) is a calcium channel blocker. It relaxes your blood vessels and decreases the amount of work the heart has to do. It treats high blood pressure and/or prevents chest pain (also called angina). This medicine may be used for other purposes; ask your health care provider or pharmacist if you have questions. COMMON BRAND NAME(S): Norvasc What should I tell my health care provider before I take this medicine? They need to know if you have any of these conditions:  heart disease  liver disease  an unusual or allergic reaction to amlodipine, other drugs, foods, dyes, or preservatives  pregnant or trying to get pregnant  breast-feeding How should I use this medicine? Take this drug by mouth. Take it as directed on the prescription label at the same time every day. You can take it with or without food. If it upsets your stomach, take it with food. Keep taking it unless your health care provider tells you to stop. Talk to your health care provider about the use of this drug in children. While it may be prescribed for children as young as 6 for selected conditions, precautions do apply. Overdosage: If you think you have taken too much of this medicine contact a poison control center or emergency room at once. NOTE: This medicine is only for you. Do not share this medicine with others. What if I miss a dose? If you miss a dose, take it as soon as you can. If it is almost time  for your next dose, take only that dose. Do not take double or extra doses. What may interact with this medicine? This medicine may interact with the following medications:  clarithromycin  cyclosporine  diltiazem  itraconazole  simvastatin  tacrolimus This list may not describe all possible interactions. Give your health care provider a list of all the medicines, herbs, non-prescription drugs, or dietary supplements you use. Also tell them if you smoke, drink alcohol, or use illegal drugs. Some items may interact with your medicine. What should I watch for while using this medicine? Visit your health care provider for regular checks on your progress. Check your blood pressure as directed. Ask your health care provider what your blood pressure should be. Also, find out when you should contact him or her. Do not treat yourself for coughs, colds, or pain while you are using this drug without asking your health care provider for advice. Some drugs may increase your blood pressure. You may get drowsy or dizzy. Do not drive, use machinery, or do anything that needs mental alertness until you know how this drug affects you. Do not stand up or sit up quickly, especially if you are an older patient. This reduces the risk of dizzy or fainting spells. What side effects may I notice from receiving this medicine? Side effects that you should report to your doctor or health care provider as soon as possible:  allergic reactions (skin rash, itching or hives; swelling of the face, lips, or tongue)  heart attack (trouble breathing; pain or tightness in the chest, neck, back or arms; unusually weak or tired)  low blood pressure (dizziness; feeling faint or lightheaded,  falls; unusually weak or tired) Side effects that usually do not require medical attention (report these to your doctor or health care provider if they continue or are bothersome):  facial flushing  nausea  palpitations  stomach  pain  sudden weight gain  swelling of the ankles, feet, hands This list may not describe all possible side effects. Call your doctor for medical advice about side effects. You may report side effects to FDA at 1-800-FDA-1088. Where should I keep my medicine? Keep out of the reach of children and pets. Store at room temperature between 59 and 86 degrees F (15 and 30 degrees C). Protect from light and moisture. Keep the container tightly closed. Throw away any unused drug after the expiration date. NOTE: This sheet is a summary. It may not cover all possible information. If you have questions about this medicine, talk to your doctor, pharmacist, or health care provider.  2020 Elsevier/Gold Standard (2019-03-28 19:39:45)  Echocardiogram An echocardiogram is a procedure that uses painless sound waves (ultrasound) to produce an image of the heart. Images from an echocardiogram can provide important information about:  Signs of coronary artery disease (CAD).  Aneurysm detection. An aneurysm is a weak or damaged part of an artery wall that bulges out from the normal force of blood pumping through the body.  Heart size and shape. Changes in the size or shape of the heart can be associated with certain conditions, including heart failure, aneurysm, and CAD.  Heart muscle function.  Heart valve function.  Signs of a past heart attack.  Fluid buildup around the heart.  Thickening of the heart muscle.  A tumor or infectious growth around the heart valves. Tell a health care provider about:  Any allergies you have.  All medicines you are taking, including vitamins, herbs, eye drops, creams, and over-the-counter medicines.  Any blood disorders you have.  Any surgeries you have had.  Any medical conditions you have.  Whether you are pregnant or may be pregnant. What are the risks? Generally, this is a safe procedure. However, problems may occur, including:  Allergic reaction to dye  (contrast) that may be used during the procedure. What happens before the procedure? No specific preparation is needed. You may eat and drink normally. What happens during the procedure?   An IV tube may be inserted into one of your veins.  You may receive contrast through this tube. A contrast is an injection that improves the quality of the pictures from your heart.  A gel will be applied to your chest.  A wand-like tool (transducer) will be moved over your chest. The gel will help to transmit the sound waves from the transducer.  The sound waves will harmlessly bounce off of your heart to allow the heart images to be captured in real-time motion. The images will be recorded on a computer. The procedure may vary among health care providers and hospitals. What happens after the procedure?  You may return to your normal, everyday life, including diet, activities, and medicines, unless your health care provider tells you not to do that. Summary  An echocardiogram is a procedure that uses painless sound waves (ultrasound) to produce an image of the heart.  Images from an echocardiogram can provide important information about the size and shape of your heart, heart muscle function, heart valve function, and fluid buildup around your heart.  You do not need to do anything to prepare before this procedure. You may eat and drink normally.  After  the echocardiogram is completed, you may return to your normal, everyday life, unless your health care provider tells you not to do that. This information is not intended to replace advice given to you by your health care provider. Make sure you discuss any questions you have with your health care provider. Document Revised: 10/13/2018 Document Reviewed: 07/25/2016 Elsevier Patient Education  2020 [employer].

## 2020-05-24 NOTE — Progress Notes (Signed)
Cardiology Office Note:    Date:  05/24/2020   ID:  [name], DOB 15-Jul-1980, MRN [id number]  PCP:  Eber Jones, NP  Cardiologist:  Garwin Brothers, MD   Referring MD: Dema Severin, NP    ASSESSMENT:    1. Essential hypertension   2. Mixed hyperlipidemia   3. Type 2 diabetes mellitus with hyperglycemia, without long-term current use of insulin (HCC)   4. Morbid obesity (HCC)   5. Cardiac murmur    PLAN:    In order of problems listed above:  1. Primary prevention stressed with the patient.  Importance of compliance with diet medication stressed and vocalized understanding. 2. Essential hypertension: Blood pressure is elevated and I will increase amlodipine to 10 mg daily.  He will keep a track of his blood pressures at home.  Lifestyle modification, diet and exercise stressed any vocalized understanding.  Salt intake issues was stressed. 3. Mixed dyslipidemia: Patient on statins and managed by primary care physician.  Diet emphasized 4. Diabetes mellitus: Managed by primary care.  Diet emphasized lifestyle modification urged 5. Cardiac murmur: Echocardiogram will be done to assess murmur heard on auscultation. 6. Morbid obesity: I discussed the risks of this to the patient at length and he promises to do better.  He will be seen in follow-up appointment in a month or earlier if he has any concerns.  He and his friend had multiple questions which were answered to their satisfaction.   Medication Adjustments/Labs and Tests Ordered: Current medicines are reviewed at length with the patient today.  Concerns regarding medicines are outlined above.  No orders of the defined types were placed in this encounter.  No orders of the defined types were placed in this encounter.    History of Present Illness:    Justin Mason is a 39 y.o. male who is being seen today for the evaluation of essential hypertension at the request of York, Regina F, NP.  Patient is a pleasant  39 year old male.  He has past medical history of essential hypertension dyslipidemia diabetes mellitus and morbid obesity.  He leads a sedentary lifestyle.  He is referred here for uncontrolled hypertension and abnormal EKG.  He does not have any chest pain orthopnea or PND.  He is an active gentleman but does not exercise on a regular basis.  Walking throughout the day and sexual activity does not bring around any symptoms such as chest pain.  At the time of my evaluation, the patient is alert awake oriented and in no distress.  Past Medical History:  Diagnosis Date  . Arthritis   . Cellulitis 01/03/2015  . Cellulitis of right leg   . Cellulitis of right lower extremity 01/03/2015  . Essential hypertension   . GERD (gastroesophageal reflux disease)   . High cholesterol   . Hyperlipemia   . Hypertension   . Rotator cuff tear, right 09/08/2011  . Sepsis (HCC) 01/03/2015  . Type 2 diabetes mellitus with hyperglycemia [facility])     Past Surgical History:  Procedure Laterality Date  . INCISION AND DRAINAGE FOOT  1991-1992   STEPPED ON NAIL / INFECTED WOUND  . SHOULDER SURGERY     September 08, 2011    Current Medications: Current Meds  Medication Sig  . allopurinol (ZYLOPRIM) 100 MG tablet Take 100 mg by mouth daily.  Marland Kitchen atorvastatin (LIPITOR) 10 MG tablet Take 10 mg by mouth daily.  . dapagliflozin propanediol (FARXIGA) 10 MG TABS tablet Take 10 mg  by mouth daily.  Marland Kitchen esomeprazole (NEXIUM) 20 MG capsule Take 20 mg by mouth daily.   Marland Kitchen ibuprofen (ADVIL,MOTRIN) 400 MG tablet Take 400 mg by mouth every 6 (six) hours as needed (pain).   . meloxicam (MOBIC) 15 MG tablet Take 15 mg by mouth daily.  . metFORMIN (GLUCOPHAGE) 1000 MG tablet Take 1,000 mg by mouth 2 (two) times daily.  . metoprolol succinate (TOPROL-XL) 100 MG 24 hr tablet Take 100 mg by mouth 2 (two) times daily.  Marland Kitchen olmesartan-hydrochlorothiazide (BENICAR HCT) 40-25 MG tablet Take 1 tablet by mouth daily.     Allergies:   Patient has no  known allergies.   Social History   Socioeconomic History  . Marital status: Married    Spouse name: Not on file  . Number of children: Not on file  . Years of education: Not on file  . Highest education level: Not on file  Occupational History  . Not on file  Tobacco Use  . Smoking status: Never Smoker  . Smokeless tobacco: Current User    Types: Snuff  Substance and Sexual Activity  . Alcohol use: Yes    Alcohol/week: 7.0 standard drinks    Types: 7 Shots of liquor per week  . Drug use: No  . Sexual activity: Yes  Other Topics Concern  . Not on file  Social History Narrative  . Not on file   Social Determinants of Health   Financial Resource Strain:   . Difficulty of Paying Living Expenses: Not on file  Food Insecurity:   . Worried About Programme researcher, broadcasting/film/video in the Last Year: Not on file  . Ran Out of Food in the Last Year: Not on file  Transportation Needs:   . Lack of Transportation (Medical): Not on file  . Lack of Transportation (Non-Medical): Not on file  Physical Activity:   . Days of Exercise per Week: Not on file  . Minutes of Exercise per Session: Not on file  Stress:   . Feeling of Stress : Not on file  Social Connections:   . Frequency of Communication with Friends and Family: Not on file  . Frequency of Social Gatherings with Friends and Family: Not on file  . Attends Religious Services: Not on file  . Active Member of Clubs or Organizations: Not on file  . Attends Banker Meetings: Not on file  . Marital Status: Not on file     Family History: The patient's family history includes Anesthesia problems in his sister; Colon cancer in an other family member; Prostate cancer in an other family member.  ROS:   Please see the history of present illness.    All other systems reviewed and are negative.  EKGs/Labs/Other Studies Reviewed:    The following studies were reviewed today: EKG was sinus rhythm poor anterior forces and nonspecific  ST-T changes   Recent Labs: No results found for requested labs within last 8760 hours.  Recent Lipid Panel No results found for: CHOL, TRIG, HDL, CHOLHDL, VLDL, LDLCALC, LDLDIRECT  Physical Exam:    VS:  BP (!) 168/105   Pulse 83   Ht 6\' 1"  (1.854 m)   Wt (!) 379 lb 3.2 oz (172 kg)   SpO2 97%   BMI 50.03 kg/m     Wt Readings from Last 3 Encounters:  05/24/20 (!) 379 lb 3.2 oz (172 kg)  01/06/15 (!) 408 lb 4.7 oz (185.2 kg)  05/04/13 (!) 400 lb (181.4 kg)  GEN: Patient is in no acute distress HEENT: Normal NECK: No JVD; No carotid bruits LYMPHATICS: No lymphadenopathy CARDIAC: S1 S2 regular, 2/6 systolic murmur at the apex. RESPIRATORY:  Clear to auscultation without rales, wheezing or rhonchi  ABDOMEN: Soft, non-tender, non-distended MUSCULOSKELETAL:  No edema; No deformity  SKIN: Warm and dry NEUROLOGIC:  Alert and oriented x 3 PSYCHIATRIC:  Normal affect    Signed, Garwin Brothers, MD  05/24/2020 4:22 PM    [facility]

## 2020-06-06 ENCOUNTER — Encounter: Payer: Self-pay | Admitting: Cardiology

## 2020-07-01 ENCOUNTER — Ambulatory Visit: Payer: Managed Care, Other (non HMO) | Admitting: Cardiology

## 2020-07-04 ENCOUNTER — Ambulatory Visit (INDEPENDENT_AMBULATORY_CARE_PROVIDER_SITE_OTHER): Payer: Managed Care, Other (non HMO)

## 2020-07-04 ENCOUNTER — Other Ambulatory Visit: Payer: Self-pay

## 2020-07-04 DIAGNOSIS — R011 Cardiac murmur, unspecified: Secondary | ICD-10-CM

## 2020-07-04 LAB — ECHOCARDIOGRAM COMPLETE
Area-P 1/2: 3.6 cm2
Calc EF: 43.9 %
S' Lateral: 4.4 cm
Single Plane A2C EF: 43.4 %
Single Plane A4C EF: 45.7 %

## 2020-07-04 NOTE — Progress Notes (Signed)
Complete echocardiogram performed.  Jimmy Juda Lajeunesse RDCS, RVT  

## 2020-07-05 ENCOUNTER — Other Ambulatory Visit: Payer: Managed Care, Other (non HMO)

## 2020-07-12 ENCOUNTER — Other Ambulatory Visit: Payer: Self-pay

## 2020-07-12 ENCOUNTER — Telehealth: Payer: Self-pay

## 2020-07-12 ENCOUNTER — Ambulatory Visit (INDEPENDENT_AMBULATORY_CARE_PROVIDER_SITE_OTHER): Payer: Managed Care, Other (non HMO) | Admitting: Cardiology

## 2020-07-12 ENCOUNTER — Encounter: Payer: Self-pay | Admitting: Cardiology

## 2020-07-12 VITALS — BP 166/107 | HR 92 | Ht 73.0 in | Wt 389.2 lb

## 2020-07-12 DIAGNOSIS — E7800 Pure hypercholesterolemia, unspecified: Secondary | ICD-10-CM

## 2020-07-12 DIAGNOSIS — I10 Essential (primary) hypertension: Secondary | ICD-10-CM

## 2020-07-12 DIAGNOSIS — R011 Cardiac murmur, unspecified: Secondary | ICD-10-CM

## 2020-07-12 DIAGNOSIS — E1165 Type 2 diabetes mellitus with hyperglycemia: Secondary | ICD-10-CM

## 2020-07-12 MED ORDER — CLONIDINE HCL 0.1 MG PO TABS: 0.1000 mg | ORAL_TABLET | Freq: Two times a day (BID) | ORAL | 6 refills | Status: DC

## 2020-07-12 NOTE — Telephone Encounter (Signed)
Spoke with patient regarding results and recommendation.  Patient verbalizes understanding and is agreeable to plan of care. Advised patient to call back with any issues or concerns.  

## 2020-07-12 NOTE — Progress Notes (Signed)
Cardiology Office Note:    Date:  07/12/2020   ID:  [name], DOB 03/12/1981, MRN [id number]  PCP:  Eber Jones, NP  Cardiologist:  Garwin Brothers, MD   Referring MD: Eber Jones, NP    ASSESSMENT:    1. Essential hypertension   2. High cholesterol   3. Cardiac murmur   4. Morbid obesity (HCC)   5. Type 2 diabetes mellitus with hyperglycemia, without long-term current use of insulin (HCC)    PLAN:    In order of problems listed above:  1. Primary prevention stressed with the patient.  Importance of compliance with diet medication stressed any vocalized understanding. 2. Essential hypertension: Blood pressure is elevated.  Lifestyle modification and diet was emphasized.  I told the patient to take clonidine 0.1 mg twice daily.  Diet was emphasized lifestyle modification was stressed.  I told him to walk at least half an hour a day on a regular basis but he does not seem keen to be doing that.  He tells me that he works 12-hour shifts 6 days a week.  He will keep a track of his blood pressures and send them to [place]. 3. Mixed dyslipidemia: Lipids followed by primary care physician.  Patient on statin therapy and diet was emphasized. 4. Diabetes mellitus and morbid obesity: Diet was emphasized.  Risks of obesity explained.  Weight reduction stressed.  He promises to do better. 5. Patient will be seen in follow-up appointment in 2 months or earlier if the patient has any concerns    Medication Adjustments/Labs and Tests Ordered: Current medicines are reviewed at length with the patient today.  Concerns regarding medicines are outlined above.  No orders of the defined types were placed in this encounter.  No orders of the defined types were placed in this encounter.    No chief complaint on file.    History of Present Illness:    Justin Mason is a 40 y.o. male.  Patient has past medical history of essential hypertension, dyslipidemia, diabetes mellitus and morbid  obesity.  He is an active gentleman but does not exercise on a regular basis.  No chest pain orthopnea or PND.  He is here for evaluation and follow-up of essential hypertension which is difficult to control.  At the time of my evaluation, the patient is alert awake oriented and in no distress.  I am not sure whether he follows dietary instructions well.  He has gained weight.  Past Medical History:  Diagnosis Date  . Arthritis   . Cardiac murmur 05/24/2020  . Cellulitis 01/03/2015  . Cellulitis of right leg   . Cellulitis of right lower extremity 01/03/2015  . Essential hypertension   . GERD (gastroesophageal reflux disease)   . High cholesterol   . Hyperlipemia   . Hypertension   . Morbid obesity (HCC) 05/24/2020  . Rotator cuff tear, right 09/08/2011  . Sepsis (HCC) 01/03/2015  . Type 2 diabetes mellitus with hyperglycemia [facility])     Past Surgical History:  Procedure Laterality Date  . INCISION AND DRAINAGE FOOT  1991-1992   STEPPED ON NAIL / INFECTED WOUND  . SHOULDER SURGERY     September 08, 2011    Current Medications: Current Meds  Medication Sig  . allopurinol (ZYLOPRIM) 100 MG tablet Take 100 mg by mouth daily.  Marland Kitchen amLODipine (NORVASC) 10 MG tablet Take 1 tablet (10 mg total) by mouth daily.  Marland Kitchen atorvastatin (LIPITOR) 10 MG tablet Take 10  mg by mouth daily.  . dapagliflozin propanediol (FARXIGA) 10 MG TABS tablet Take 10 mg by mouth daily.  Marland Kitchen esomeprazole (NEXIUM) 20 MG capsule Take 20 mg by mouth daily.  Marland Kitchen ibuprofen (ADVIL,MOTRIN) 400 MG tablet Take 400 mg by mouth every 6 (six) hours as needed (pain).   . meloxicam (MOBIC) 15 MG tablet Take 15 mg by mouth daily.  . metFORMIN (GLUCOPHAGE) 1000 MG tablet Take 1,000 mg by mouth 2 (two) times daily.  . metoprolol succinate (TOPROL-XL) 100 MG 24 hr tablet Take 100 mg by mouth 2 (two) times daily.  Marland Kitchen olmesartan-hydrochlorothiazide (BENICAR HCT) 40-25 MG tablet Take 1 tablet by mouth daily.     Allergies:   Patient has no known  allergies.   Social History   Socioeconomic History  . Marital status: Married    Spouse name: Not on file  . Number of children: Not on file  . Years of education: Not on file  . Highest education level: Not on file  Occupational History  . Not on file  Tobacco Use  . Smoking status: Never Smoker  . Smokeless tobacco: Current User    Types: Snuff  Substance and Sexual Activity  . Alcohol use: Yes    Alcohol/week: 7.0 standard drinks    Types: 7 Shots of liquor per week  . Drug use: No  . Sexual activity: Yes  Other Topics Concern  . Not on file  Social History Narrative  . Not on file   Social Determinants of Health   Financial Resource Strain: Not on file  Food Insecurity: Not on file  Transportation Needs: Not on file  Physical Activity: Not on file  Stress: Not on file  Social Connections: Not on file     Family History: The patient's family history includes Anesthesia problems in his sister; Colon cancer in an other family member; Prostate cancer in an other family member.  ROS:   Please see the history of present illness.    All other systems reviewed and are negative.  EKGs/Labs/Other Studies Reviewed:    The following studies were reviewed today: IMPRESSIONS    1. Left ventricular ejection fraction, by estimation, is 60 to 65%. The  left ventricle has normal function. The left ventricle has no regional  wall motion abnormalities. There is moderate left ventricular hypertrophy.  Left ventricular diastolic  parameters are consistent with Grade I diastolic dysfunction (impaired  relaxation).  2. Right ventricular systolic function is normal. The right ventricular  size is normal. There is normal pulmonary artery systolic pressure.    Recent Labs: No results found for requested labs within last 8760 hours.  Recent Lipid Panel No results found for: CHOL, TRIG, HDL, CHOLHDL, VLDL, LDLCALC, LDLDIRECT  Physical Exam:    VS:  BP (!) 166/107   Pulse  92   Ht 6\' 1"  (1.854 m)   Wt (!) 389 lb 3.2 oz (176.5 kg)   SpO2 95%   BMI 51.35 kg/m     Wt Readings from Last 3 Encounters:  07/12/20 (!) 389 lb 3.2 oz (176.5 kg)  05/24/20 (!) 379 lb 3.2 oz (172 kg)  01/06/15 (!) 408 lb 4.7 oz (185.2 kg)     GEN: Patient is in no acute distress HEENT: Normal NECK: No JVD; No carotid bruits LYMPHATICS: No lymphadenopathy CARDIAC: Hear sounds regular, 2/6 systolic murmur at the apex. RESPIRATORY:  Clear to auscultation without rales, wheezing or rhonchi  ABDOMEN: Soft, non-tender, non-distended MUSCULOSKELETAL:  No edema; No deformity  SKIN: Warm and dry NEUROLOGIC:  Alert and oriented x 3 PSYCHIATRIC:  Normal affect   Signed, Jenean Lindau, MD  07/12/2020 2:43 PM    [facility]

## 2020-07-12 NOTE — Patient Instructions (Addendum)
Medication Instructions:  Your physician has recommended you make the following change in your medication:  -- START Clonidine 0.1 mg - Take 1 tablet (0.1 mg) by mouth twice daily -- RX SENT *If you need a refill on your cardiac medications before your next appointment, please call your pharmacy*  Follow-Up: At [facility], you and your health needs are our priority.  As part of our continuing mission to provide you with exceptional heart care, we have created designated Provider Care Teams.  These Care Teams include your primary Cardiologist (physician) and Advanced Practice Providers (APPs -  Physician Assistants and Nurse Practitioners) who all work together to provide you with the care you need, when you need it.  We recommend signing up for the patient portal called "MyChart".  Sign up information is provided on this After Visit Summary.  MyChart is used to connect with patients for Virtual Visits (Telemedicine).  Patients are able to view lab/test results, encounter notes, upcoming appointments, etc.  Non-urgent messages can be sent to your provider as well.   To learn more about what you can do with MyChart, go to [email or web address].    Your next appointment:   Your physician recommends that you schedule a follow-up appointment in: 2 MONTHS with Dr. Tomie China  The format for your next appointment:   In Person with Belva Crome, MD

## 2020-07-12 NOTE — Telephone Encounter (Signed)
-----   Message from Garwin Brothers, MD sent at 07/04/2020  3:54 PM EST ----- Systolic function and ejection fraction is normal.  Exercise regularly.  Keep blood pressure under control.  Left ventricular muscle is thickened because of high blood pressure.  Copy primary care will discuss more in appointment Garwin Brothers, MD 07/04/2020 3:54 PM

## 2020-07-24 ENCOUNTER — Emergency Department (HOSPITAL_COMMUNITY): Payer: Managed Care, Other (non HMO)

## 2020-07-24 ENCOUNTER — Inpatient Hospital Stay (HOSPITAL_COMMUNITY): Payer: Managed Care, Other (non HMO)

## 2020-07-24 ENCOUNTER — Inpatient Hospital Stay (HOSPITAL_COMMUNITY)
Admission: EM | Admit: 2020-07-24 | Discharge: 2020-08-09 | DRG: 064 | Disposition: A | Discharge status: Rehabilitation Facility | Payer: Managed Care, Other (non HMO) | Attending: Internal Medicine | Admitting: Internal Medicine

## 2020-07-24 ENCOUNTER — Emergency Department (HOSPITAL_COMMUNITY): Payer: Managed Care, Other (non HMO) | Admitting: Certified Registered Nurse Anesthetist

## 2020-07-24 ENCOUNTER — Encounter (HOSPITAL_COMMUNITY): Payer: Self-pay | Admitting: Certified Registered Nurse Anesthetist

## 2020-07-24 ENCOUNTER — Encounter (HOSPITAL_COMMUNITY)
Admission: EM | Disposition: A | Discharge status: Rehabilitation Facility | Payer: Self-pay | Source: Home / Self Care | Attending: Internal Medicine

## 2020-07-24 DIAGNOSIS — L89320 Pressure ulcer of left buttock, unstageable: Secondary | ICD-10-CM | POA: Diagnosis not present

## 2020-07-24 DIAGNOSIS — F32A Depression, unspecified: Secondary | ICD-10-CM | POA: Diagnosis present

## 2020-07-24 DIAGNOSIS — R29810 Facial weakness: Secondary | ICD-10-CM | POA: Diagnosis present

## 2020-07-24 DIAGNOSIS — I6389 Other cerebral infarction: Secondary | ICD-10-CM | POA: Diagnosis not present

## 2020-07-24 DIAGNOSIS — I10 Essential (primary) hypertension: Secondary | ICD-10-CM | POA: Diagnosis present

## 2020-07-24 DIAGNOSIS — E871 Hypo-osmolality and hyponatremia: Secondary | ICD-10-CM | POA: Diagnosis not present

## 2020-07-24 DIAGNOSIS — Z7984 Long term (current) use of oral hypoglycemic drugs: Secondary | ICD-10-CM

## 2020-07-24 DIAGNOSIS — R0989 Other specified symptoms and signs involving the circulatory and respiratory systems: Secondary | ICD-10-CM | POA: Diagnosis not present

## 2020-07-24 DIAGNOSIS — I639 Cerebral infarction, unspecified: Secondary | ICD-10-CM | POA: Diagnosis present

## 2020-07-24 DIAGNOSIS — E7800 Pure hypercholesterolemia, unspecified: Secondary | ICD-10-CM | POA: Diagnosis present

## 2020-07-24 DIAGNOSIS — Z6841 Body Mass Index (BMI) 40.0 and over, adult: Secondary | ICD-10-CM | POA: Diagnosis not present

## 2020-07-24 DIAGNOSIS — Z720 Tobacco use: Secondary | ICD-10-CM | POA: Diagnosis not present

## 2020-07-24 DIAGNOSIS — R482 Apraxia: Secondary | ICD-10-CM | POA: Diagnosis present

## 2020-07-24 DIAGNOSIS — M25561 Pain in right knee: Secondary | ICD-10-CM | POA: Diagnosis not present

## 2020-07-24 DIAGNOSIS — R471 Dysarthria and anarthria: Secondary | ICD-10-CM | POA: Diagnosis present

## 2020-07-24 DIAGNOSIS — I6300 Cerebral infarction due to thrombosis of unspecified precerebral artery: Secondary | ICD-10-CM | POA: Diagnosis present

## 2020-07-24 DIAGNOSIS — G4733 Obstructive sleep apnea (adult) (pediatric): Secondary | ICD-10-CM | POA: Diagnosis present

## 2020-07-24 DIAGNOSIS — M7989 Other specified soft tissue disorders: Secondary | ICD-10-CM | POA: Diagnosis not present

## 2020-07-24 DIAGNOSIS — Z79899 Other long term (current) drug therapy: Secondary | ICD-10-CM | POA: Diagnosis not present

## 2020-07-24 DIAGNOSIS — N179 Acute kidney failure, unspecified: Secondary | ICD-10-CM | POA: Diagnosis not present

## 2020-07-24 DIAGNOSIS — U071 COVID-19: Secondary | ICD-10-CM | POA: Diagnosis present

## 2020-07-24 DIAGNOSIS — Z791 Long term (current) use of non-steroidal anti-inflammatories (NSAID): Secondary | ICD-10-CM | POA: Diagnosis not present

## 2020-07-24 DIAGNOSIS — K219 Gastro-esophageal reflux disease without esophagitis: Secondary | ICD-10-CM | POA: Diagnosis present

## 2020-07-24 DIAGNOSIS — R29708 NIHSS score 8: Secondary | ICD-10-CM | POA: Diagnosis present

## 2020-07-24 DIAGNOSIS — E119 Type 2 diabetes mellitus without complications: Secondary | ICD-10-CM | POA: Diagnosis present

## 2020-07-24 DIAGNOSIS — Z8679 Personal history of other diseases of the circulatory system: Secondary | ICD-10-CM | POA: Diagnosis not present

## 2020-07-24 DIAGNOSIS — E785 Hyperlipidemia, unspecified: Secondary | ICD-10-CM | POA: Diagnosis present

## 2020-07-24 DIAGNOSIS — R4701 Aphasia: Secondary | ICD-10-CM | POA: Diagnosis present

## 2020-07-24 DIAGNOSIS — R509 Fever, unspecified: Secondary | ICD-10-CM | POA: Diagnosis not present

## 2020-07-24 DIAGNOSIS — E1169 Type 2 diabetes mellitus with other specified complication: Secondary | ICD-10-CM | POA: Diagnosis not present

## 2020-07-24 DIAGNOSIS — J95821 Acute postprocedural respiratory failure: Secondary | ICD-10-CM | POA: Diagnosis not present

## 2020-07-24 DIAGNOSIS — E876 Hypokalemia: Secondary | ICD-10-CM | POA: Diagnosis not present

## 2020-07-24 DIAGNOSIS — I63512 Cerebral infarction due to unspecified occlusion or stenosis of left middle cerebral artery: Principal | ICD-10-CM | POA: Diagnosis present

## 2020-07-24 DIAGNOSIS — R7401 Elevation of levels of liver transaminase levels: Secondary | ICD-10-CM | POA: Diagnosis not present

## 2020-07-24 DIAGNOSIS — E669 Obesity, unspecified: Secondary | ICD-10-CM | POA: Diagnosis not present

## 2020-07-24 DIAGNOSIS — N390 Urinary tract infection, site not specified: Secondary | ICD-10-CM | POA: Diagnosis not present

## 2020-07-24 DIAGNOSIS — G8191 Hemiplegia, unspecified affecting right dominant side: Secondary | ICD-10-CM | POA: Diagnosis present

## 2020-07-24 DIAGNOSIS — R1312 Dysphagia, oropharyngeal phase: Secondary | ICD-10-CM | POA: Diagnosis present

## 2020-07-24 DIAGNOSIS — M792 Neuralgia and neuritis, unspecified: Secondary | ICD-10-CM | POA: Diagnosis not present

## 2020-07-24 DIAGNOSIS — E1165 Type 2 diabetes mellitus with hyperglycemia: Secondary | ICD-10-CM | POA: Diagnosis not present

## 2020-07-24 DIAGNOSIS — I69351 Hemiplegia and hemiparesis following cerebral infarction affecting right dominant side: Secondary | ICD-10-CM | POA: Diagnosis not present

## 2020-07-24 DIAGNOSIS — I672 Cerebral atherosclerosis: Secondary | ICD-10-CM | POA: Diagnosis present

## 2020-07-24 DIAGNOSIS — Z8042 Family history of malignant neoplasm of prostate: Secondary | ICD-10-CM | POA: Diagnosis not present

## 2020-07-24 DIAGNOSIS — R011 Cardiac murmur, unspecified: Secondary | ICD-10-CM | POA: Diagnosis present

## 2020-07-24 DIAGNOSIS — M25562 Pain in left knee: Secondary | ICD-10-CM | POA: Diagnosis not present

## 2020-07-24 DIAGNOSIS — D62 Acute posthemorrhagic anemia: Secondary | ICD-10-CM | POA: Diagnosis not present

## 2020-07-24 DIAGNOSIS — M25521 Pain in right elbow: Secondary | ICD-10-CM | POA: Diagnosis not present

## 2020-07-24 DIAGNOSIS — I6602 Occlusion and stenosis of left middle cerebral artery: Secondary | ICD-10-CM | POA: Diagnosis not present

## 2020-07-24 DIAGNOSIS — Z9289 Personal history of other medical treatment: Secondary | ICD-10-CM

## 2020-07-24 DIAGNOSIS — G8929 Other chronic pain: Secondary | ICD-10-CM | POA: Diagnosis not present

## 2020-07-24 HISTORY — PX: IR ANGIO VERTEBRAL SEL SUBCLAVIAN INNOMINATE UNI L MOD SED: IMG5364

## 2020-07-24 HISTORY — PX: RADIOLOGY WITH ANESTHESIA: SHX6223

## 2020-07-24 HISTORY — PX: IR ANGIO INTRA EXTRACRAN SEL INTERNAL CAROTID BILAT MOD SED: IMG5363

## 2020-07-24 HISTORY — PX: IR ANGIO INTRA EXTRACRAN SEL COM CAROTID INNOMINATE BILAT MOD SED: IMG5360

## 2020-07-24 HISTORY — PX: IR ANGIO VERTEBRAL SEL VERTEBRAL UNI R MOD SED: IMG5368

## 2020-07-24 HISTORY — DX: Obstructive sleep apnea (adult) (pediatric): G47.33

## 2020-07-24 HISTORY — PX: IR ANGIO VERTEBRAL SEL SUBCLAVIAN INNOMINATE BILAT MOD SED: IMG5366

## 2020-07-24 LAB — COMPREHENSIVE METABOLIC PANEL
ALT: 84 U/L — ABNORMAL HIGH (ref 0–44)
AST: 51 U/L — ABNORMAL HIGH (ref 15–41)
Albumin: 4.1 g/dL (ref 3.5–5.0)
Alkaline Phosphatase: 65 U/L (ref 38–126)
Anion gap: 15 (ref 5–15)
BUN: 18 mg/dL (ref 6–20)
CO2: 24 mmol/L (ref 22–32)
Calcium: 10 mg/dL (ref 8.9–10.3)
Chloride: 102 mmol/L (ref 98–111)
Creatinine, Ser: 1.1 mg/dL (ref 0.61–1.24)
GFR, Estimated: >60 mL/min (ref >60)
Glucose, Bld: 164 mg/dL — ABNORMAL HIGH (ref 70–99)
Potassium: 3.6 mmol/L (ref 3.5–5.1)
Sodium: 141 mmol/L (ref 135–145)
Total Bilirubin: 0.8 mg/dL (ref 0.3–1.2)
Total Protein: 7.9 g/dL (ref 6.5–8.1)

## 2020-07-24 LAB — CBC
HCT: 51 % (ref 39.0–52.0)
Hemoglobin: 18 g/dL — ABNORMAL HIGH (ref 13.0–17.0)
MCH: 30.9 pg (ref 26.0–34.0)
MCHC: 35.3 g/dL (ref 30.0–36.0)
MCV: 87.5 fL (ref 80.0–100.0)
Platelets: 201 10*3/uL (ref 150–400)
RBC: 5.83 MIL/uL — ABNORMAL HIGH (ref 4.22–5.81)
RDW: 13.3 % (ref 11.5–15.5)
WBC: 6.4 10*3/uL (ref 4.0–10.5)
nRBC: 0 % (ref 0.0–0.2)

## 2020-07-24 LAB — DIFFERENTIAL
Abs Immature Granulocytes: 0.02 10*3/uL (ref 0.00–0.07)
Basophils Absolute: 0 10*3/uL (ref 0.0–0.1)
Basophils Relative: 0 %
Eosinophils Absolute: 0 10*3/uL (ref 0.0–0.5)
Eosinophils Relative: 1 %
Immature Granulocytes: 0 %
Lymphocytes Relative: 39 %
Lymphs Abs: 2.5 10*3/uL (ref 0.7–4.0)
Monocytes Absolute: 0.7 10*3/uL (ref 0.1–1.0)
Monocytes Relative: 11 %
Neutro Abs: 3.1 10*3/uL (ref 1.7–7.7)
Neutrophils Relative %: 49 %

## 2020-07-24 LAB — LACTIC ACID, PLASMA: Lactic Acid, Venous: 2.9 mmol/L (ref 0.5–1.9)

## 2020-07-24 LAB — D-DIMER, QUANTITATIVE: D-Dimer, Quant: 0.35 ug/mL-FEU (ref 0.00–0.50)

## 2020-07-24 LAB — I-STAT CHEM 8, ED
BUN: 22 mg/dL — ABNORMAL HIGH (ref 6–20)
Calcium, Ion: 1.12 mmol/L — ABNORMAL LOW (ref 1.15–1.40)
Chloride: 103 mmol/L (ref 98–111)
Creatinine, Ser: 1 mg/dL (ref 0.61–1.24)
Glucose, Bld: 158 mg/dL — ABNORMAL HIGH (ref 70–99)
HCT: 51 % (ref 39.0–52.0)
Hemoglobin: 17.3 g/dL — ABNORMAL HIGH (ref 13.0–17.0)
Potassium: 3.7 mmol/L (ref 3.5–5.1)
Sodium: 142 mmol/L (ref 135–145)
TCO2: 25 mmol/L (ref 22–32)

## 2020-07-24 LAB — FERRITIN: Ferritin: 270 ng/mL (ref 24–336)

## 2020-07-24 LAB — C-REACTIVE PROTEIN: CRP: 0.5 mg/dL (ref <1.0)

## 2020-07-24 LAB — TRIGLYCERIDES: Triglycerides: 156 mg/dL — ABNORMAL HIGH (ref <150)

## 2020-07-24 LAB — FIBRINOGEN: Fibrinogen: 357 mg/dL (ref 210–475)

## 2020-07-24 LAB — LACTATE DEHYDROGENASE: LDH: 185 U/L (ref 98–192)

## 2020-07-24 LAB — PROCALCITONIN: Procalcitonin: <0.1 ng/mL

## 2020-07-24 LAB — POC SARS CORONAVIRUS 2 AG -  ED: SARS Coronavirus 2 Ag: POSITIVE — AB

## 2020-07-24 LAB — PROTIME-INR
INR: 1 (ref 0.8–1.2)
Prothrombin Time: 12.7 seconds (ref 11.4–15.2)

## 2020-07-24 LAB — APTT: aPTT: 31 seconds (ref 24–36)

## 2020-07-24 IMAGING — XA IR CAROTID INTERNAL HEAD/NECK BILAT  (MS)
1 series · 11 of 24 positions shown · IV contrast (IODINE)
Comparison: CT angiogram of the head and neck [DATE].

INDICATION: New onset of aphasia.  Abnormal CT angiogram of the head and neck.

EXAM:
1. EMERGENT LARGE VESSEL OCCLUSION THROMBOLYSIS (anterior
CIRCULATION)
TECHNIQUE: Following a full explanation of the procedure along with the
potential associated complications, an informed witnessed consent
was obtained from the father. The risks of intracranial hemorrhage
of 10%, worsening neurological deficit, ventilator dependency, death
and inability to revascularize were all reviewed in detail with the
patient's father.

[Series 300: dr. (person_name) · 11 of 315 slices shown]
[im 14/315]
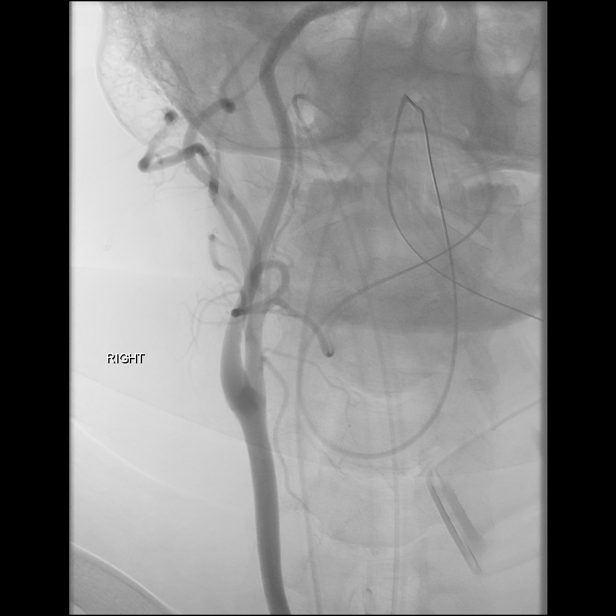
[im 41/315]
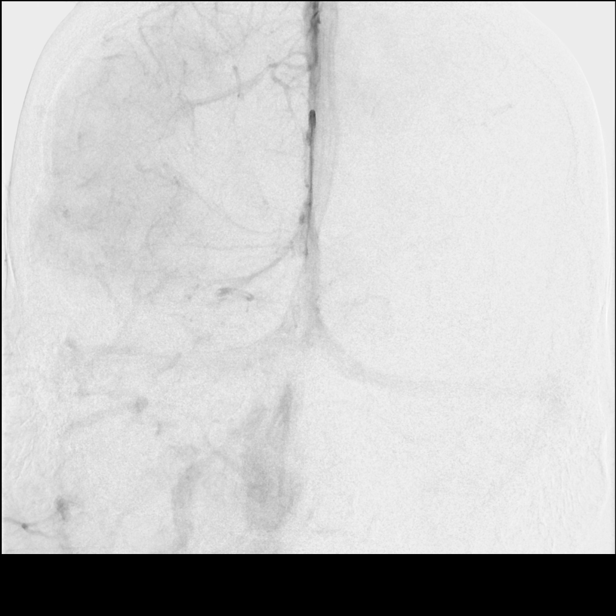
[im 69/315]
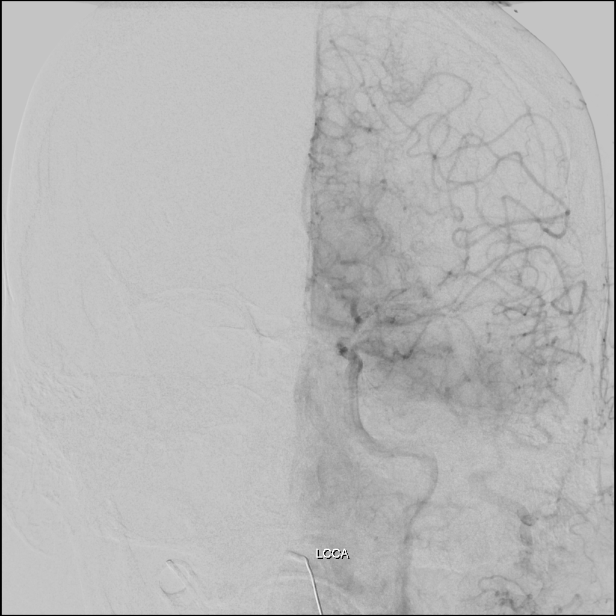
[im 96/315]
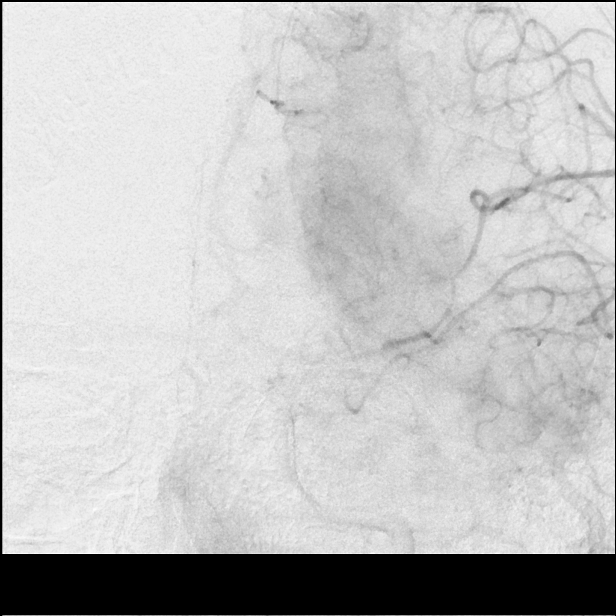
[im 123/315]
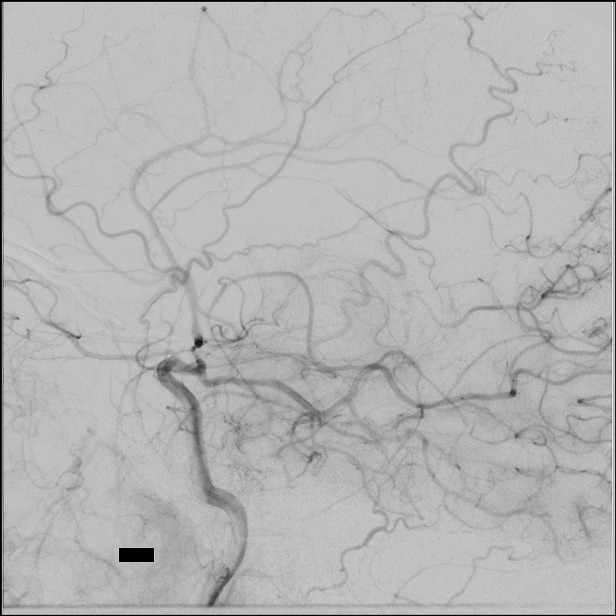
[im 164/315]
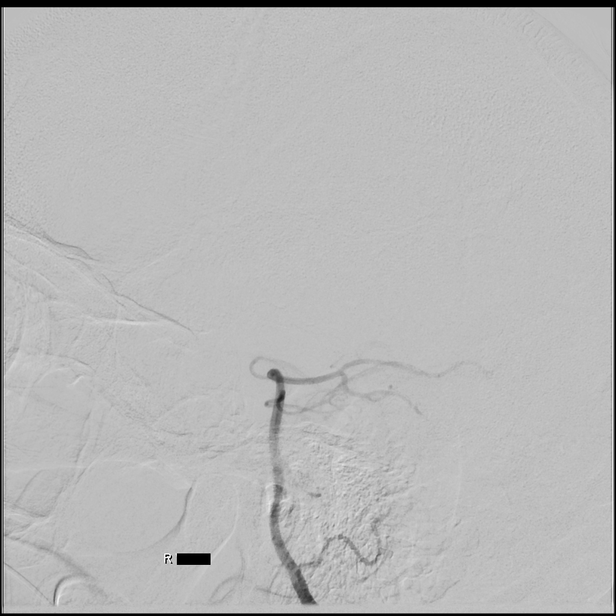
[im 192/315]
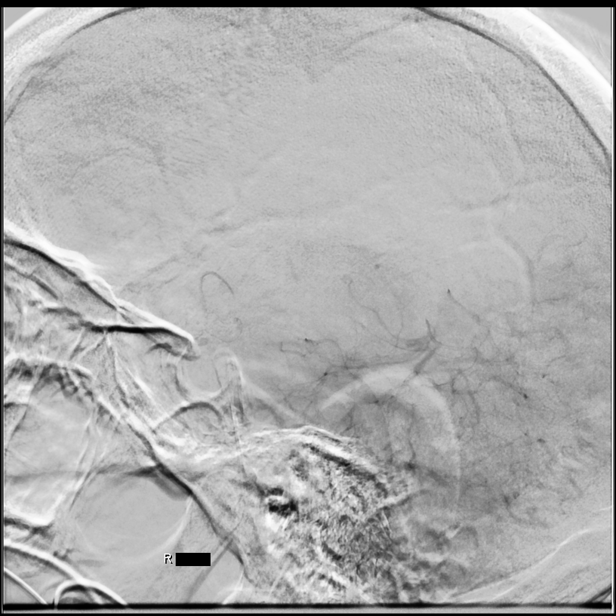
[im 219/315]
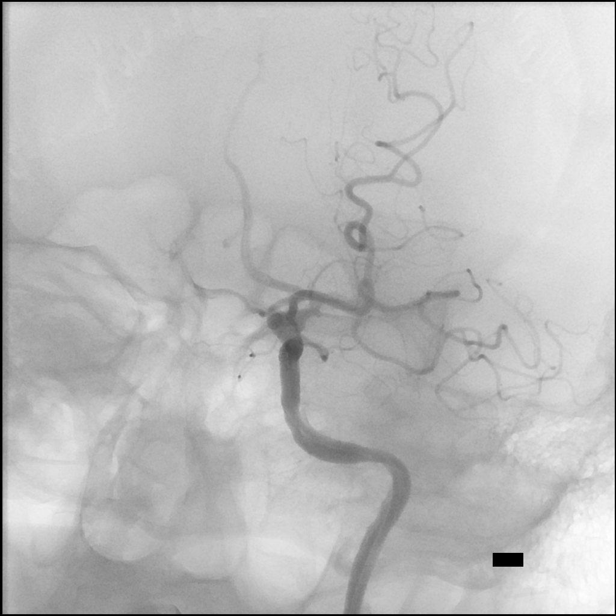
[im 246/315]
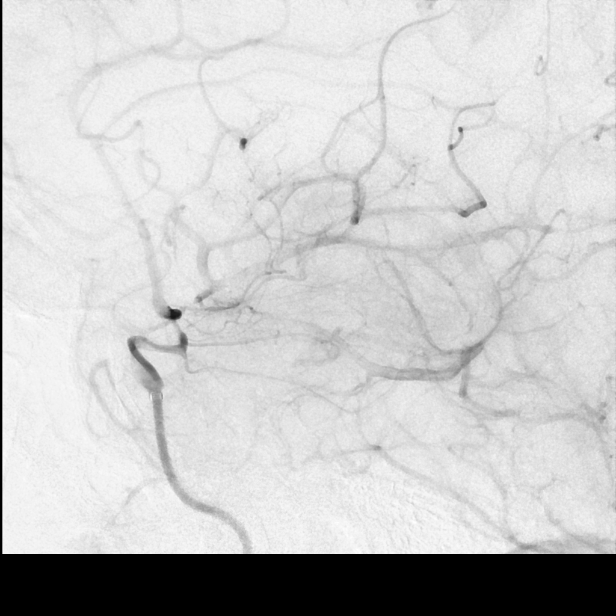
[im 274/315]
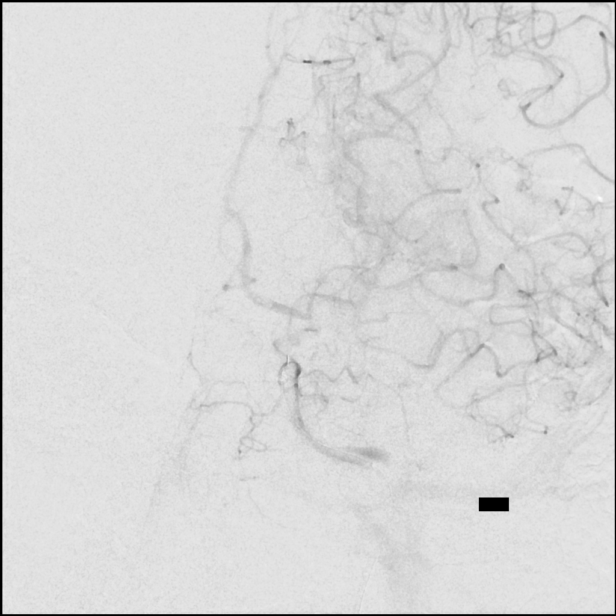
[im 301/315]
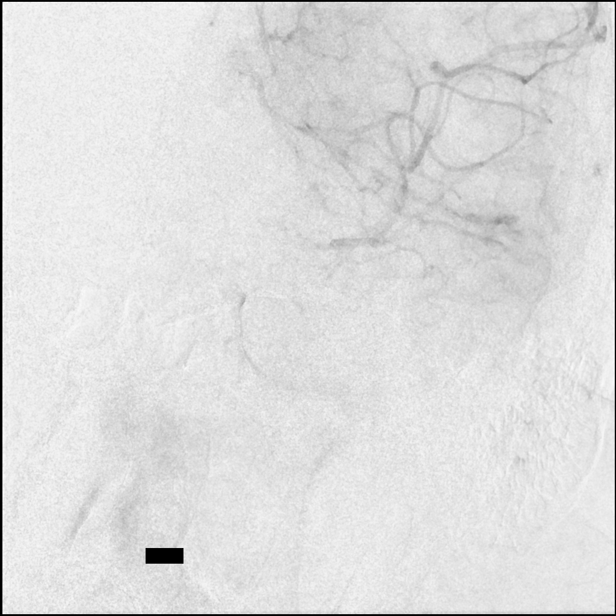

[11 of 24 positions shown; findings below may reference images not displayed]

MEDICATIONS:
Ancef 2 g IV antibiotic was administered within 1 hour of the
procedure.

ANESTHESIA/SEDATION:
General anesthesia

CONTRAST:  Isovue 300 90 mL

FLUOROSCOPY TIME:  Fluoroscopy Time: 15 minutes 30 seconds ([26]
mGy).

COMPLICATIONS:
None immediate.
The patient was then put under general anesthesia by the [REDACTED] at [HOSPITAL].

The right groin was prepped and draped in the usual sterile fashion.
Thereafter using modified Seldinger technique, transfemoral access
into the right common femoral artery was obtained without
difficulty. Over a 0.035 inch guidewire an 8 French 25 cm Pinnacle
sheath was inserted. Through this, and also over a 0.035 inch
guidewire a 5 French JB 1 catheter was advanced to the aortic arch
region and selectively positioned in the right common carotid
artery, the left common carotid artery, the right vertebral artery
and the left subclavian artery.
FINDINGS: The right common carotid arteriogram demonstrates the right external
carotid artery and its major branches to be widely patent.

The right internal carotid artery at the bulb to the cranial skull
base is widely patent.

The petrous, the cavernous and the supraclinoid segments are widely
patent.

A there is severe tapered stenosis of the right middle cerebral
artery proximal to mid M1 segment with flow noted into the distal
trifurcation branches in a delayed manner compared to the right
anterior cerebral artery hemodynamically.

The right anterior cerebral artery opacifies into the capillary and
venous phases. Prompt cross-filling via the anterior communicating
artery of the left anterior cerebral artery A2 segment and distally
is noted. The left common carotid arteriogram demonstrates the left
external carotid artery and its major branches to be widely patent.

The left internal carotid artery at the bulb to the cranial skull
base demonstrates wide patency.

Prominent left posterior communicating artery is seen opacifying the
left posterior cerebral distribution.

Distal to this there is a severe tapered stenosis of the left
internal carotid supraclinoid segment leading to the origin of the
left anterior cerebral artery A1 segment with the distal anterior
cerebral artery distribution being widely patent.

Non opacification of the left middle cerebral artery M1 segment in
its proximal segment is noted.

However, there is a delayed retrograde opacification of the left MCA
trifurcation branches and subsequently the distal half of the left
middle cerebral M1 segment from retrograde collaterals arising from
the anterior cerebral artery, and the leptomeningeal branches of the
P3 P4 segments of the left posterior cerebral artery and also the
anterior and posterior temporal branches of the left posterior
cerebral artery P1 P2 segment.

The left subclavian arteriogram demonstrates hypoplastic left
vertebral artery origin to be widely patent.

The vessel is seen to opacify to the cranial skull base supplying
the left vertebrobasilar junction and the left posterior-inferior
cerebellar artery.

More distally opacification is seen partially of the basilar artery
with inflow of unopacified blood from the more dominant right
vertebral artery.

The right vertebral artery origin is widely patent.

The vessel opacifies to the cranial skull base. Patency is seen of
the right vertebrobasilar junction and the right posterior-inferior
cerebellar artery.

The basilar artery, the right posterior cerebral artery, the
superior cerebellar arteries and the anterior-inferior cerebellar
arteries opacify into the capillary and venous phases.

Distal branches from the right posterior cerebral artery P3 P4
segment retrogradely opacified partially the right posterior
parietooccipital region.

PROCEDURE:
Diagnostic JB 1 catheter in the left internal carotid artery was
exchanged over a 0.035 inch 300 cm Rosen exchange guidewire for an
087 balloon guide which had been prepped with 50% contrast and 50%
heparinized saline infusion. This was positioned right at the origin
of the left internal carotid artery.

In a coaxial manner and with constant heparinized saline infusion
using biplane roadmap technique, a combination of an 014 inch
Synchro standard micro guidewire with a J configuration with an 021
Headway microcatheter was advanced within a 132 cm 6 French Catalyst
guide catheter advanced to the proximal cavernous segment of the
left internal carotid artery. Using a torque device, the micro
guidewire was gently manipulated to advance into near occlusive
supraclinoid left ICA. The wire was then gently maneuvered into the
proximal left middle cerebral artery where significant resistance
was noted to its advancement.

With gentle torquing, the wire suddenly gave way and advanced into
the proximal inferior division left MCA. The microcatheter was then
gently advanced into the supraclinoid left ICA with modest
resistance. However, it could not be advanced into the proximal left
middle cerebral artery again meeting significant resistance despite
multiple maneuvers such as advancing the micro guidewire more
distally in the inferior division. This suggested chronic nature to
the occluded left middle cerebral artery and the supraclinoid left
ICA.

The micro guidewire and microcatheter were withdrawn.

Control arteriograms were then performed from the Catalyst 6 French
guide catheter in the left internal carotid artery which continued
to demonstrate no change in the left posterior cerebral artery and
the left anterior cerebral artery, with retrograde opacification of
the left middle cerebral artery distal M1 region.

No further attempts were made in regards to revascularization of the
left middle cerebral artery given the chronic nature of the
occlusion. The 6 French Catalyst guide catheter, and the balloon
guide were retrieved and removed. The 8 French Pinnacle sheath was
removed with successful hemostasis in the right groin with an 8
French Angio-Seal closure device. The right groin appeared soft.
Distal pulses remained Dopplerable in both feet.

The patient was left intubated on account of the patient's positive
COVID status and return to the PACU and then neuro ICU for further
management.
IMPRESSION: Status post attempted revascularization of occluded left middle
cerebral artery proximally, and of the supraclinoid left ICA as
described above.

Severe high-grade smooth stenosis of the right middle cerebral
artery M1 segment.

PLAN:
As per referring TIGER.

## 2020-07-24 IMAGING — CT CT HEAD CODE STROKE
4 series · 16 of 47 positions shown, 18 images · non-contrast
Comparison: None.

CLINICAL DATA: Code stroke.  Right arm weakness

EXAM:
CT HEAD WITHOUT CONTRAST
TECHNIQUE: Contiguous axial images were obtained from the base of the skull
through the vertex without intravenous contrast.

[Series 2: head 5.0 st · axial · 0.47mm/px · z∈[-86,+39]mm · 7 of 35 slices shown, 9 images]
[im 5/35  brain]
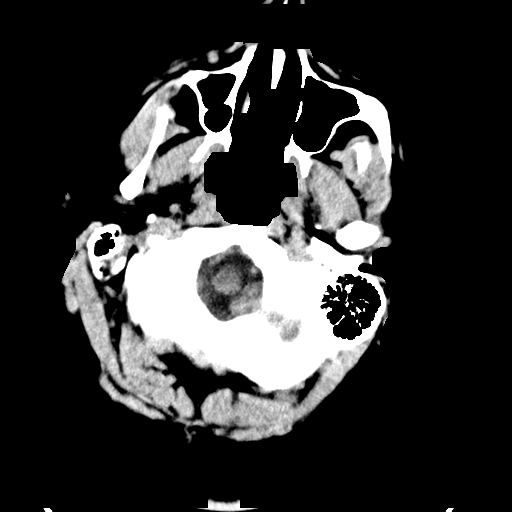
[im 5/35  bone]
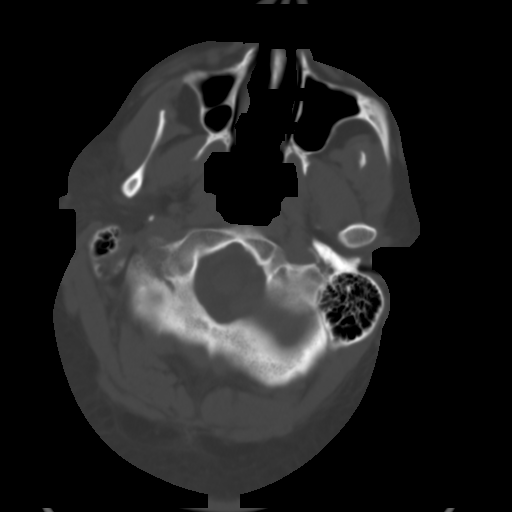
[im 9/35  brain]
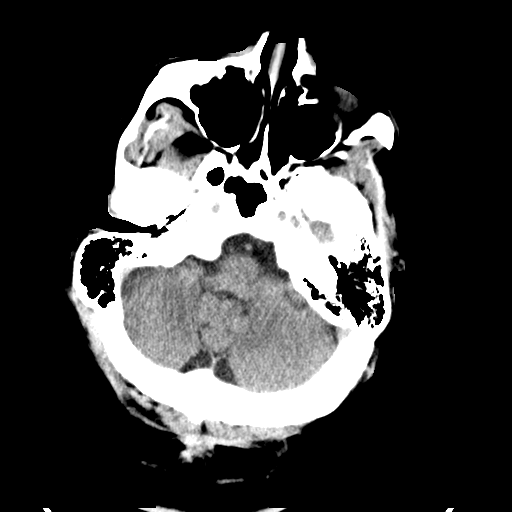
[im 13/35  brain]
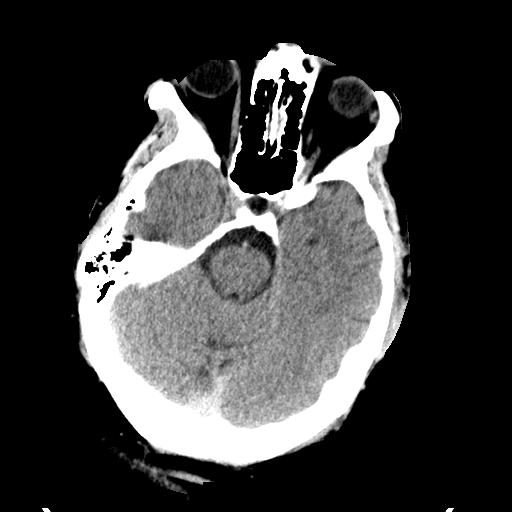
[im 18/35  brain]
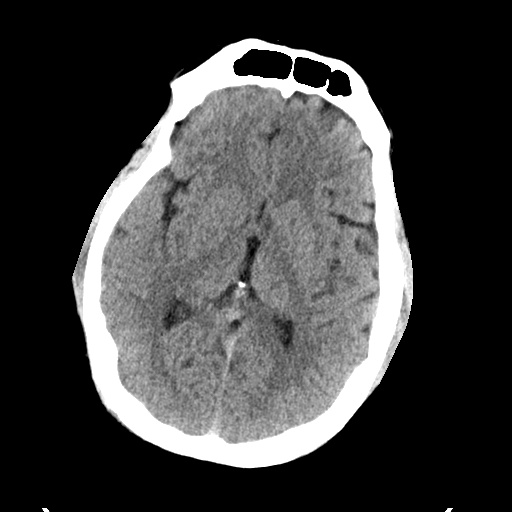
[im 22/35  brain]
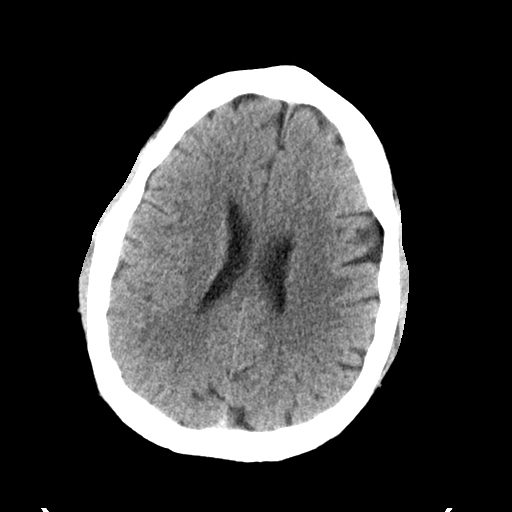
[im 22/35  bone]
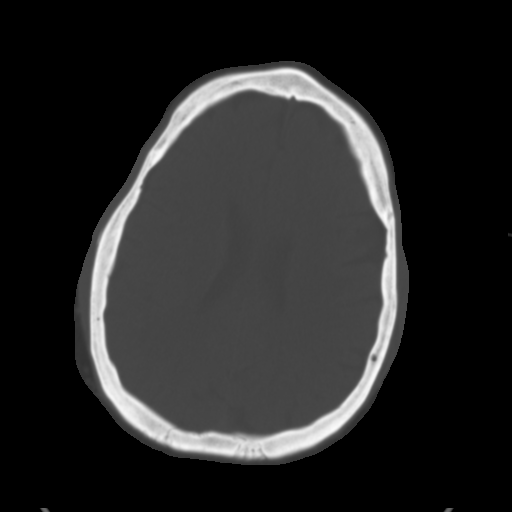
[im 26/35  brain]
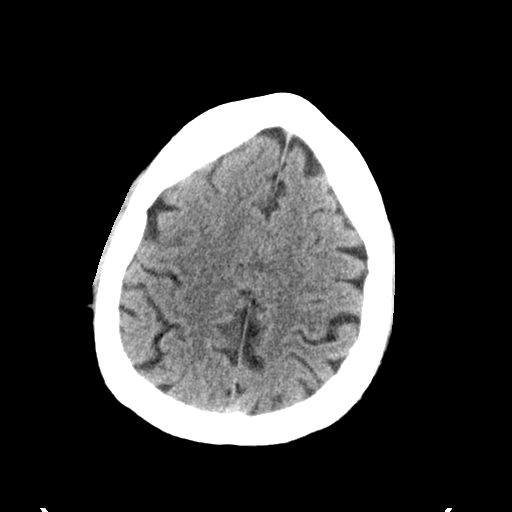
[im 30/35  brain]
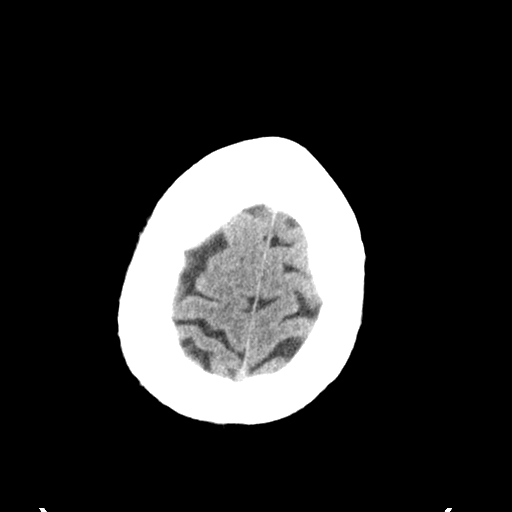

[Series 4: head 2.0 bone · axial · 0.47mm/px · z∈[-90,-56]mm · 3 of 87 slices shown]
[im 9/87  bone]
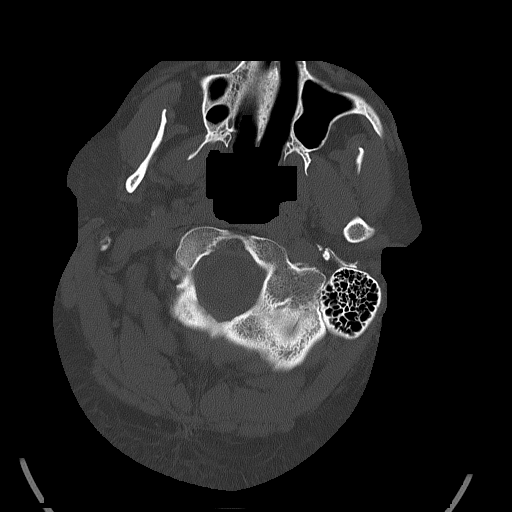
[im 18/87  bone]
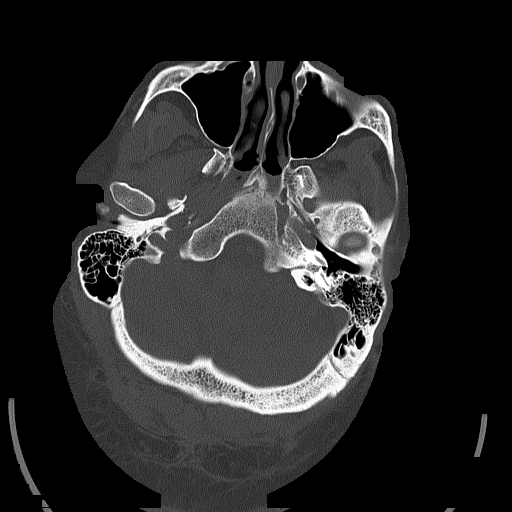
[im 26/87  bone]
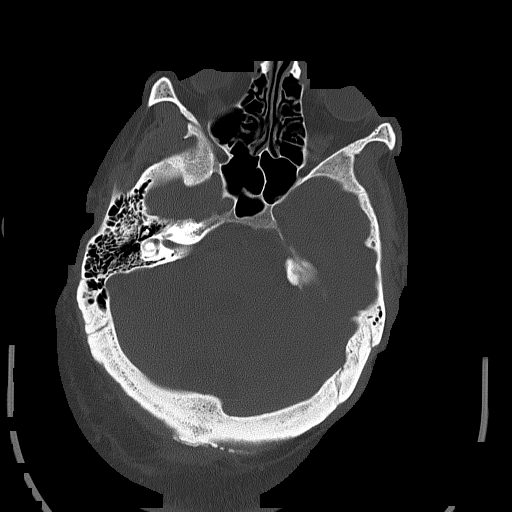

[Series 5: head 3.0 cor st · coronal · 0.34mm/px · 3 of 71 slices shown]
[im 24/71  brain]
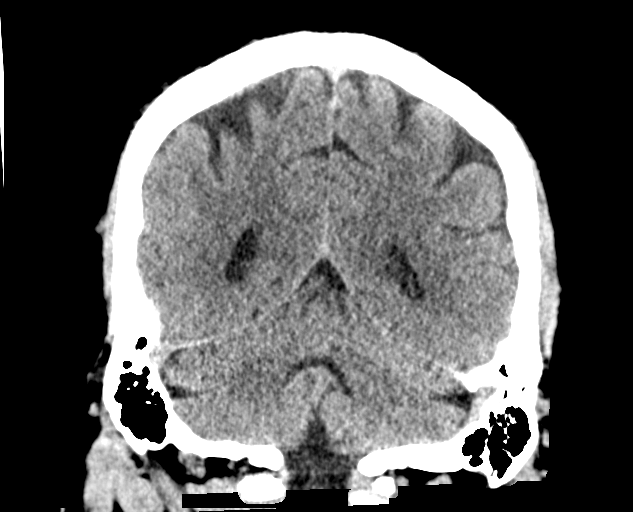
[im 32/71  brain]
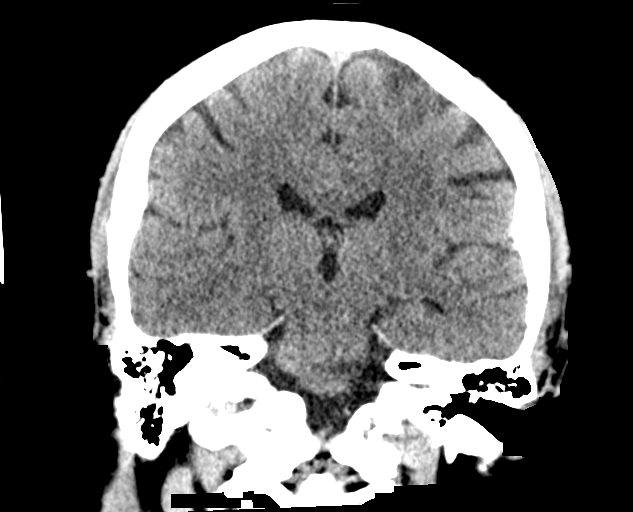
[im 39/71  brain]
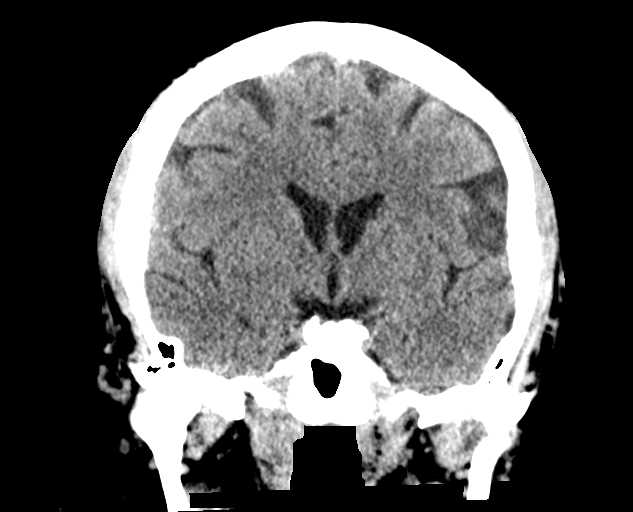

[Series 6: head 3.0 sag st · sagittal · 0.34mm/px · 3 of 68 slices shown]
[im 23/68  brain]
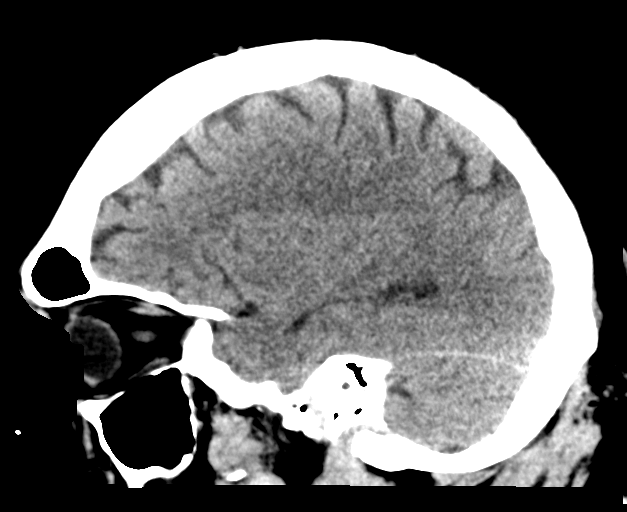
[im 34/68  brain]
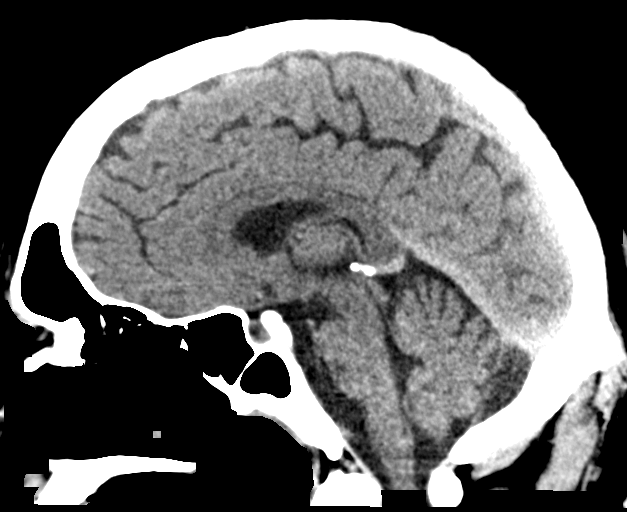
[im 45/68  brain]
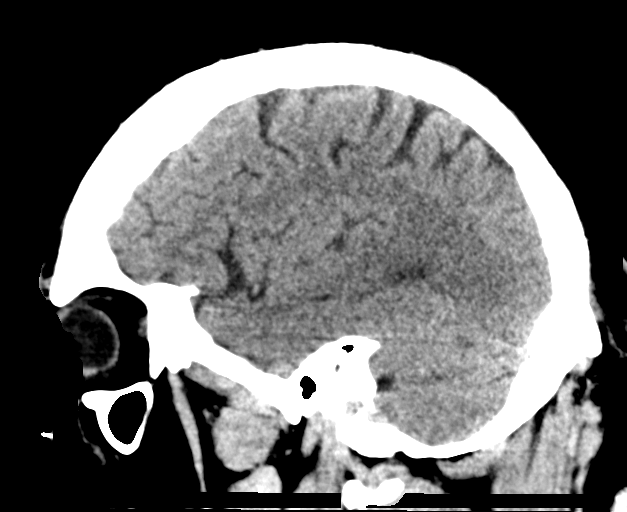

[16 of 47 positions shown; findings below may reference images not displayed]

FINDINGS: Brain: Small foci of hypodensity within the left caudate and left
thalamus, may represent small infarcts, age indeterminate. No large
acute territorial infarct, hemorrhage, hydrocephalus, extra-axial
collection or mass lesion.

Vascular: No hyperdense vessel or unexpected calcification.

Skull: Normal. Negative for fracture or focal lesion.

Sinuses/Orbits: No acute finding.

ASPECTS (Alberta Stroke Program Early CT Score)

- Ganglionic level infarction (caudate, lentiform nuclei, internal
capsule, insula, M1-M3 cortex): 6

- Supraganglionic infarction (M4-M6 cortex): 3

Total score (0-10 with 10 being normal): 9
IMPRESSION: 1. Small foci of hypodensity within the left caudate and left
thalamus, may represent small infarcts, age indeterminate.
2. Aspect score is 9.

These results were called by telephone at the time of interpretation
on [DATE] at [DATE] to provider Dr. ARIJANIT, who verbally
acknowledged these results.

## 2020-07-24 IMAGING — DX DG CHEST 1V PORT
1 series · 1 of 1 positions shown · non-contrast
Comparison: [DATE]

CLINICAL DATA: Intubated

EXAM:
PORTABLE CHEST 1 VIEW

[chest ap]
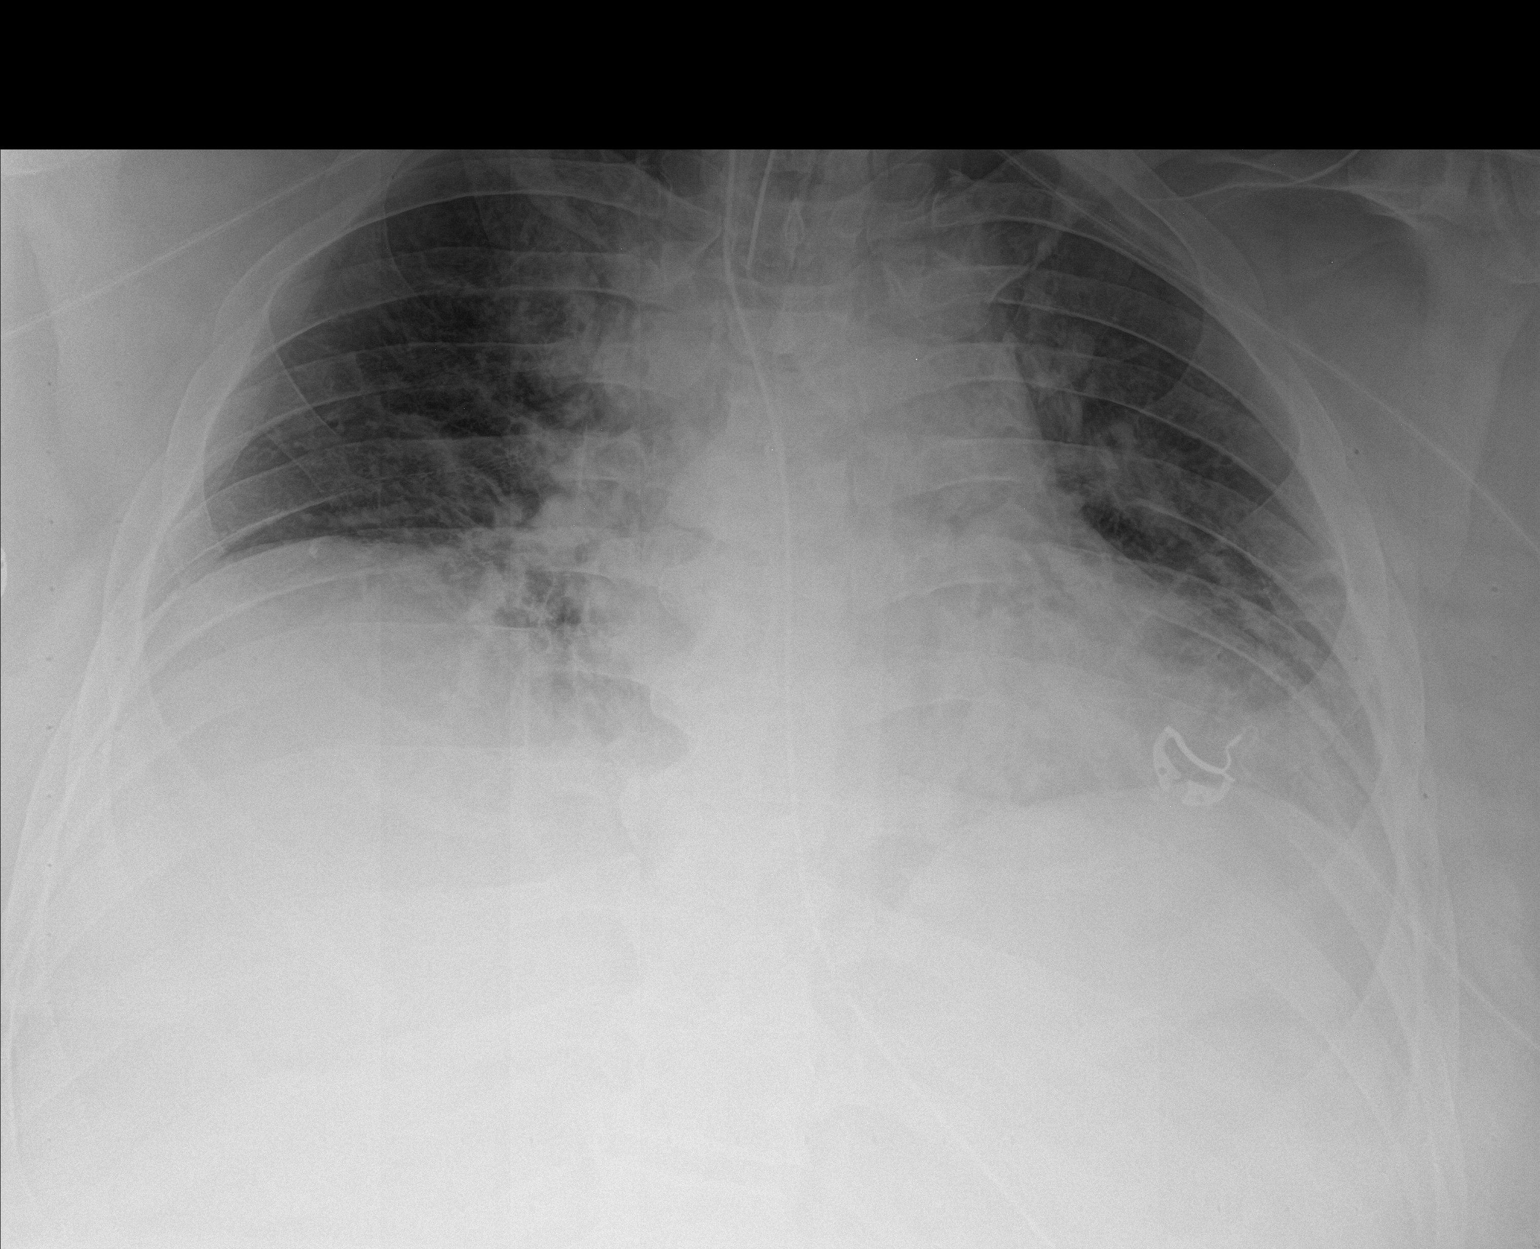

[1 of 1 positions shown; findings below may reference images not displayed]

FINDINGS: Endotracheal tube tip about 2.4 cm superior to the carina.
Esophageal tube tip is below the diaphragm but incompletely
visualized. Low lung volumes. Streaky right basilar opacity with
patchy right suprahilar and left basilar airspace disease. No
pneumothorax
IMPRESSION: 1. Endotracheal tube tip about 2.4 cm superior to the carina.
2. Low lung volumes results in central bronchovascular crowding.
Patchy airspace disease at the right suprahilar lung, and left
greater than right base either reflecting atelectasis versus
multifocal infection

## 2020-07-24 SURGERY — IR WITH ANESTHESIA
Anesthesia: General

## 2020-07-24 MED ORDER — SODIUM CHLORIDE 0.9 % IV SOLN: INTRAVENOUS | Status: DC | PRN

## 2020-07-24 MED ORDER — ROCURONIUM BROMIDE 10 MG/ML (PF) SYRINGE
PREFILLED_SYRINGE | INTRAVENOUS | Status: DC | PRN
  Administered 2020-07-24 (×2): 50 mg via INTRAVENOUS
  Administered 2020-07-24: 30 mg via INTRAVENOUS
  Administered 2020-07-24: 40 mg via INTRAVENOUS
  Administered 2020-07-24: 10 mg via INTRAVENOUS

## 2020-07-24 MED ORDER — PROPOFOL 500 MG/50ML IV EMUL
INTRAVENOUS | Status: DC | PRN
  Administered 2020-07-24: 75 ug/kg/min via INTRAVENOUS

## 2020-07-24 MED ORDER — ACETAMINOPHEN 160 MG/5ML PO SOLN: 650.0000 mg | ORAL | Status: DC | PRN

## 2020-07-24 MED ORDER — IOHEXOL 300 MG/ML  SOLN
150.0000 mL | Freq: Once | INTRAMUSCULAR | Status: AC | PRN
  Administered 2020-07-24: 100 mL via INTRA_ARTERIAL

## 2020-07-24 MED ORDER — NITROGLYCERIN 1 MG/10 ML FOR IR/CATH LAB
INTRA_ARTERIAL | Status: AC
  Filled 2020-07-24: qty 10

## 2020-07-24 MED ORDER — INSULIN ASPART 100 UNIT/ML ~~LOC~~ SOLN
0.0000 [IU] | SUBCUTANEOUS | Status: DC
  Administered 2020-07-24 – 2020-07-25 (×4): 2 [IU] via SUBCUTANEOUS
  Administered 2020-07-25: 3 [IU] via SUBCUTANEOUS
  Administered 2020-07-25: 2 [IU] via SUBCUTANEOUS
  Administered 2020-07-26: 3 [IU] via SUBCUTANEOUS
  Administered 2020-07-26 – 2020-07-27 (×7): 2 [IU] via SUBCUTANEOUS
  Administered 2020-07-27 – 2020-07-28 (×2): 3 [IU] via SUBCUTANEOUS
  Administered 2020-07-28: 2 [IU] via SUBCUTANEOUS
  Administered 2020-07-28: 5 [IU] via SUBCUTANEOUS
  Administered 2020-07-28 – 2020-07-29 (×4): 2 [IU] via SUBCUTANEOUS
  Administered 2020-07-29 (×3): 3 [IU] via SUBCUTANEOUS
  Administered 2020-07-29: 2 [IU] via SUBCUTANEOUS
  Administered 2020-07-29 – 2020-07-30 (×3): 3 [IU] via SUBCUTANEOUS
  Administered 2020-07-30 (×2): 2 [IU] via SUBCUTANEOUS
  Administered 2020-07-30 – 2020-07-31 (×4): 3 [IU] via SUBCUTANEOUS
  Administered 2020-07-31: 2 [IU] via SUBCUTANEOUS
  Administered 2020-07-31: 3 [IU] via SUBCUTANEOUS
  Administered 2020-07-31: 5 [IU] via SUBCUTANEOUS
  Administered 2020-07-31 – 2020-08-01 (×4): 3 [IU] via SUBCUTANEOUS
  Administered 2020-08-01: 2 [IU] via SUBCUTANEOUS
  Administered 2020-08-01 (×2): 3 [IU] via SUBCUTANEOUS
  Administered 2020-08-01: 2 [IU] via SUBCUTANEOUS
  Administered 2020-08-02: 3 [IU] via SUBCUTANEOUS
  Administered 2020-08-02: 2 [IU] via SUBCUTANEOUS
  Administered 2020-08-02: 3 [IU] via SUBCUTANEOUS

## 2020-07-24 MED ORDER — HEPARIN SODIUM (PORCINE) 10000 UNIT/ML IJ SOLN
7500.0000 [IU] | Freq: Three times a day (TID) | INTRAMUSCULAR | Status: DC
  Administered 2020-07-25 – 2020-08-09 (×45): 7500 [IU] via SUBCUTANEOUS
  Filled 2020-07-24 (×55): qty 1

## 2020-07-24 MED ORDER — ONDANSETRON HCL 4 MG/2ML IJ SOLN
INTRAMUSCULAR | Status: DC | PRN
  Administered 2020-07-24: 4 mg via INTRAVENOUS

## 2020-07-24 MED ORDER — SODIUM CHLORIDE 0.9% FLUSH: 3.0000 mL | Freq: Once | INTRAVENOUS | Status: DC

## 2020-07-24 MED ORDER — FENTANYL CITRATE (PF) 100 MCG/2ML IJ SOLN: 50.0000 ug | INTRAMUSCULAR | Status: DC | PRN

## 2020-07-24 MED ORDER — IOHEXOL 350 MG/ML SOLN
100.0000 mL | Freq: Once | INTRAVENOUS | Status: AC | PRN
  Administered 2020-07-24: 100 mL via INTRAVENOUS

## 2020-07-24 MED ORDER — SUCCINYLCHOLINE CHLORIDE 20 MG/ML IJ SOLN
INTRAMUSCULAR | Status: DC | PRN
  Administered 2020-07-24: 160 mg via INTRAVENOUS

## 2020-07-24 MED ORDER — LIDOCAINE 2% (20 MG/ML) 5 ML SYRINGE
INTRAMUSCULAR | Status: DC | PRN
  Administered 2020-07-24: 100 mg via INTRAVENOUS

## 2020-07-24 MED ORDER — SODIUM CHLORIDE 0.9 % IV SOLN: INTRAVENOUS | Status: DC

## 2020-07-24 MED ORDER — CEFAZOLIN SODIUM-DEXTROSE 2-4 GM/100ML-% IV SOLN
INTRAVENOUS | Status: AC
  Filled 2020-07-24: qty 100

## 2020-07-24 MED ORDER — STROKE: EARLY STAGES OF RECOVERY BOOK
Freq: Once | Status: AC
  Administered 2020-07-26: 1
  Filled 2020-07-24: qty 1

## 2020-07-24 MED ORDER — SODIUM CHLORIDE 0.9 % IV SOLN: 250.0000 mL | INTRAVENOUS | Status: DC

## 2020-07-24 MED ORDER — PROPOFOL 1000 MG/100ML IV EMUL
5.0000 ug/kg/min | INTRAVENOUS | Status: DC
  Administered 2020-07-24: 60 ug/kg/min via INTRAVENOUS
  Administered 2020-07-24: 50 ug/kg/min via INTRAVENOUS
  Administered 2020-07-25: 60 ug/kg/min via INTRAVENOUS
  Administered 2020-07-25: 70 ug/kg/min via INTRAVENOUS
  Administered 2020-07-25: 50 ug/kg/min via INTRAVENOUS
  Administered 2020-07-25: 70 ug/kg/min via INTRAVENOUS
  Administered 2020-07-25: 60 ug/kg/min via INTRAVENOUS
  Filled 2020-07-24: qty 200
  Filled 2020-07-24 (×2): qty 100

## 2020-07-24 MED ORDER — CEFAZOLIN SODIUM-DEXTROSE 2-3 GM-%(50ML) IV SOLR
INTRAVENOUS | Status: DC | PRN
  Administered 2020-07-24: 1 g via INTRAVENOUS
  Administered 2020-07-24: 2 g via INTRAVENOUS

## 2020-07-24 MED ORDER — PHENYLEPHRINE HCL-NACL 10-0.9 MG/250ML-% IV SOLN
25.0000 ug/min | INTRAVENOUS | Status: DC
  Administered 2020-07-24 – 2020-07-25 (×2): 25 ug/min via INTRAVENOUS

## 2020-07-24 MED ORDER — LACTATED RINGERS IV SOLN: INTRAVENOUS | Status: DC | PRN

## 2020-07-24 MED ORDER — ACETAMINOPHEN 650 MG RE SUPP
650.0000 mg | RECTAL | Status: DC | PRN
  Administered 2020-07-26: 650 mg via RECTAL
  Filled 2020-07-24: qty 1

## 2020-07-24 MED ORDER — ASPIRIN 300 MG RE SUPP
300.0000 mg | Freq: Every day | RECTAL | Status: DC
  Administered 2020-07-25 – 2020-07-26 (×2): 300 mg via RECTAL
  Filled 2020-07-24 (×2): qty 1

## 2020-07-24 MED ORDER — ACETAMINOPHEN 325 MG PO TABS: 650.0000 mg | ORAL_TABLET | ORAL | Status: DC | PRN

## 2020-07-24 MED ORDER — PROPOFOL 10 MG/ML IV BOLUS
INTRAVENOUS | Status: DC | PRN
  Administered 2020-07-24: 150 mg via INTRAVENOUS

## 2020-07-24 MED ORDER — DEXAMETHASONE SODIUM PHOSPHATE 10 MG/ML IJ SOLN
INTRAMUSCULAR | Status: DC | PRN
  Administered 2020-07-24: 5 mg via INTRAVENOUS

## 2020-07-24 MED ORDER — FENTANYL CITRATE (PF) 100 MCG/2ML IJ SOLN
INTRAMUSCULAR | Status: AC
  Filled 2020-07-24: qty 2

## 2020-07-24 MED ORDER — PHENYLEPHRINE HCL-NACL 10-0.9 MG/250ML-% IV SOLN
INTRAVENOUS | Status: DC | PRN
Start: 2020-07-24 — End: 2020-07-24
  Administered 2020-07-24: 25 ug/min via INTRAVENOUS

## 2020-07-24 MED ORDER — PHENYLEPHRINE HCL (PRESSORS) 10 MG/ML IV SOLN
INTRAVENOUS | Status: DC | PRN
  Administered 2020-07-24: 80 ug via INTRAVENOUS

## 2020-07-24 MED ORDER — FENTANYL CITRATE (PF) 100 MCG/2ML IJ SOLN
INTRAMUSCULAR | Status: DC | PRN
  Administered 2020-07-24: 50 ug via INTRAVENOUS

## 2020-07-24 MED ORDER — ATORVASTATIN CALCIUM 10 MG PO TABS: 10.0000 mg | ORAL_TABLET | Freq: Every day | ORAL | Status: DC

## 2020-07-24 MED ORDER — CLEVIDIPINE BUTYRATE 0.5 MG/ML IV EMUL: 0.0000 mg/h | INTRAVENOUS | Status: DC

## 2020-07-24 MED ORDER — ASPIRIN 325 MG PO TABS
325.0000 mg | ORAL_TABLET | Freq: Every day | ORAL | Status: DC
  Administered 2020-07-27 – 2020-08-09 (×14): 325 mg via ORAL
  Filled 2020-07-24 (×14): qty 1

## 2020-07-24 MED ORDER — PANTOPRAZOLE SODIUM 40 MG PO PACK: 40.0000 mg | PACK | Freq: Every day | ORAL | Status: DC

## 2020-07-24 MED ORDER — INSULIN ASPART 100 UNIT/ML ~~LOC~~ SOLN
SUBCUTANEOUS | Status: AC
  Filled 2020-07-24: qty 1

## 2020-07-24 MED ORDER — ACETAMINOPHEN 650 MG RE SUPP: 650.0000 mg | RECTAL | Status: DC | PRN

## 2020-07-24 NOTE — OR Nursing (Signed)
Pt mechanically vented, awaiting room, belongings given to family, see mar and flowsheet

## 2020-07-24 NOTE — Sedation Documentation (Signed)
Right femoral sheath removed, 8 Fr. angioseal deployed 

## 2020-07-24 NOTE — Consult Note (Addendum)
NAME:  Justin Mason, MRN:  [id number], DOB:  08-May-1981, LOS: 0 ADMISSION DATE:  07/24/2020, CONSULTATION DATE:  1/19 REFERRING MD:  Corliss Skains , CHIEF COMPLAINT:  Mechanically ventilated -- L MCA occlusion  Brief History:  40 yo M with COVID who remains intubated following 4 vessel cerebral arteriogram with unsuccessful attempt of angioplasty of MCA   History of Present Illness:  40 yo M PMH morbid obesity, DM2, HLD, HTN, known COVID-19 infection (dx less than 1 week PTA) who presented to ED 1/19 with aphasia and RUE weakness beginning at about 1030 AM 07/24/2020 and progressing until 1230 same day with R facial droop. CT H with concern for L sided infarcts CTA with short segment occlusion of R M1/MCA, L ICA occlusion, proximal L M1, stenosis of PCAs. Outside of window for tpa. Went to [facility] for revascularization but angioplasty of MCA unsuccessful due to resistance.  Remains intubated after NIR  PCCM consulted in this setting  Past Medical History:  Morbid obesity HLD DM2 with hyperglycemia High cholesterol HTN HLD 2/6 systolic murmur  Significant Hospital Events:  1/19 NIR unsuccessful angioplasty of MCA  Consults:  PCCM  Procedures:  1/19 ETT  Significant Diagnostic Tests:  CTA: Proximal R M1/MCA opacification. L ICA terminus and proximal L M1 opacification, Bilateral ACA PCA stenosis.   Micro Data:  COVID positive   Antimicrobials:    Interim History / Subjective:  Remains intubated after NIR   Objective   Blood pressure (!) 144/84, pulse 81, temperature 98.3 F (36.8 C), temperature source Oral, resp. rate (!) 26, weight (!) 172.8 kg, SpO2 99 %.        Intake/Output Summary (Last 24 hours) at 07/24/2020 1844 Last data filed at 07/24/2020 1700 Gross per 24 hour  Intake 100 ml  Output --  Net 100 ml   Filed Weights   07/24/20 1615  Weight: (!) 172.8 kg    Examination: General: Adult male, on vent  HENT: ETT/OG in place  Lungs: clear breath sounds,  vent assisted breaths  Cardiovascular: RRR, no MRG Abdomen: Distended, active bowel sounds Extremities: -edema  Neuro: sedated, opens eyes with physical stimulation, does not follow commands, withdrawals LUE, LLE, RLE, does not move RUE GU: intact   Resolved Hospital Problem list     Assessment & Plan:    Respiratory Insuffiencey in post-operative setting  COVID-19 infection (reportedly positive one week ago)  P -Continue Vent Support >> attempted SBT however patient oxygen saturation dropped to 8%, recovery with BVM. Currently on 40/5  -Obtain CXR and ABG  -Propofol/Fentanyl for RASS goal -1/-2  L ICA occlusion, severe R MCA stenosis -s/p unsuccessful revasc Plan -Per Neurology  -BP goal 120- 140 -MRI pending  -PT/OT/ST when extubated.  -ECHO pending  -Continue home statin, Lipid profile pending  DM Plan -Hemoglobin AIC pending  -Trend Glucose, SSI   Best practice (evaluated daily)  Diet: NPO Pain/Anxiety/Delirium protocol (if indicated): Propofol/Fentanyl  VAP protocol (if indicated) DVT prophylaxis: SCD, heparin sq GI prophylaxis: PPI Glucose control: Trend glucose  Mobility: bedrest  Disposition:admit to icu   Goals of Care:  Last date of multidisciplinary goals of care discussion: Family and staff present:  Summary of discussion:  Follow up goals of care discussion due:  Code Status: FC   Labs   CBC: Recent Labs  Lab 07/24/20 1546 07/24/20 1554  WBC 6.4  --   NEUTROABS 3.1  --   HGB 18.0* 17.3*  HCT 51.0 51.0  MCV 87.5  --  PLT 201  --     Basic Metabolic Panel: Recent Labs  Lab 07/24/20 1546 07/24/20 1554  NA 141 142  K 3.6 3.7  CL 102 103  CO2 24  --   GLUCOSE 164* 158*  BUN 18 22*  CREATININE 1.10 1.00  CALCIUM 10.0  --    GFR: Estimated Creatinine Clearance: 164.3 mL/min (by C-G formula based on SCr of 1 mg/dL). Recent Labs  Lab 07/24/20 1546 07/24/20 1604  WBC 6.4  --   LATICACIDVEN  --  2.9*    Liver Function  Tests: Recent Labs  Lab 07/24/20 1546  AST 51*  ALT 84*  ALKPHOS 65  BILITOT 0.8  PROT 7.9  ALBUMIN 4.1   No results for input(s): LIPASE, AMYLASE in the last 168 hours. No results for input(s): AMMONIA in the last 168 hours.  ABG    Component Value Date/Time   TCO2 25 07/24/2020 1554     Coagulation Profile: Recent Labs  Lab 07/24/20 1546  INR 1.0    Cardiac Enzymes: No results for input(s): CKTOTAL, CKMB, CKMBINDEX, TROPONINI in the last 168 hours.  HbA1C: Hgb A1c MFr Bld  Date/Time Value Ref Range Status  01/03/2015 07:11 AM 5.4 4.8 - 5.6 % Final    Comment:    (NOTE)         Pre-diabetes: 5.7 - 6.4         Diabetes: >6.4         Glycemic control for adults with diabetes: <7.0     CBG: No results for input(s): GLUCAP in the last 168 hours.  Review of Systems:   Unable to obtain, intubated and sedated   Past Medical History:  He,  has a past medical history of Arthritis, Cardiac murmur (05/24/2020), Cellulitis (01/03/2015), Cellulitis of right leg, Cellulitis of right lower extremity (01/03/2015), Essential hypertension, GERD (gastroesophageal reflux disease), High cholesterol, Hyperlipemia, Hypertension, Morbid obesity (HCC) (05/24/2020), Rotator cuff tear, right (09/08/2011), Sepsis (HCC) (01/03/2015), and Type 2 diabetes mellitus with hyperglycemia (HCC).   Surgical History:   Past Surgical History:  Procedure Laterality Date   INCISION AND DRAINAGE FOOT  [phone]   STEPPED ON NAIL / INFECTED WOUND   SHOULDER SURGERY     September 08, 2011     Social History:   reports that he has never smoked. His smokeless tobacco use includes snuff. He reports current alcohol use of about 7.0 standard drinks of alcohol per week. He reports that he does not use drugs.   Family History:  His family history includes Anesthesia problems in his sister; Colon cancer in an other family member; Prostate cancer in an other family member.   Allergies No Known Allergies    Home Medications  Prior to Admission medications   Medication Sig Start Date End Date Taking? Authorizing Provider  allopurinol (ZYLOPRIM) 100 MG tablet Take 100 mg by mouth daily. 05/22/20   [provider]  amLODipine (NORVASC) 10 MG tablet Take 1 tablet (10 mg total) by mouth daily. 05/24/20 08/22/20  Revankar, Aundra Dubin, MD  atorvastatin (LIPITOR) 10 MG tablet Take 10 mg by mouth daily.    [provider]  cloNIDine (CATAPRES) 0.1 MG tablet Take 1 tablet (0.1 mg total) by mouth 2 (two) times daily. 07/12/20   Revankar, Aundra Dubin, MD  dapagliflozin propanediol (FARXIGA) 10 MG TABS tablet Take 10 mg by mouth daily.    [provider]  esomeprazole (NEXIUM) 20 MG capsule Take 20 mg by mouth daily.  [provider]  ibuprofen (ADVIL,MOTRIN) 400 MG tablet Take 400 mg by mouth every 6 (six) hours as needed (pain).     [provider]  meloxicam (MOBIC) 15 MG tablet Take 15 mg by mouth daily.    [provider]  metFORMIN (GLUCOPHAGE) 1000 MG tablet Take 1,000 mg by mouth 2 (two) times daily. 02/28/20   [provider]  metoprolol succinate (TOPROL-XL) 100 MG 24 hr tablet Take 100 mg by mouth 2 (two) times daily.    [provider]  olmesartan-hydrochlorothiazide (BENICAR HCT) 40-25 MG tablet Take 1 tablet by mouth daily.    [provider]     Critical care time: 52 minutes     Jovita Kussmaul, AGACNP-BC [facility]  PCCM Pgr: [phone]

## 2020-07-24 NOTE — Procedures (Signed)
S/P 4 vesse;l carebral arteriogram RT CFA approach Findings. 1.Severe preocclusive stenosis of RT MCA M 1 seg. 2.Severelt severe stenosis of LT ICA supraclinoid segment with flow in the Lt ACA. Occluded LT MCA Proximally with retrograde filling to distal M1 from prominent Lt PCA collaterals. Unsuccessful attempt at angioplasty of MCA due to  sig resistance to advancement of  microcatheter through the occlusion.. 57F angioseal for hemostasis in the Rt groin.. Distal pulses all dopplerable. Covid positive. Patient left intubated. S.Elazar Argabright MD

## 2020-07-24 NOTE — ED Provider Notes (Incomplete)
Shared service with APP.  I have personally seen and examined the patient, providing direct face to face care.  Physical exam findings and plan include aphasia and weakness since ~10 am today. Out of TPA window. On exam expressive aphasia, moving UE and LE equal. Neuro at bedside in CT for assessment. CVA evaluation.   No diagnosis found.

## 2020-07-24 NOTE — Progress Notes (Signed)
Message Dr.Kirkpatrick lactic Acid results of 2.9.  He called and acknowledged results,  Probably a combination of stroke and covid

## 2020-07-24 NOTE — Anesthesia Preprocedure Evaluation (Addendum)
Anesthesia Evaluation  Patient identified by MRN, date of birth, ID band Patient unresponsive    Reviewed: Allergy & Precautions, NPO status , Patient's Chart, lab work & pertinent test results, Unable to perform ROS - Chart review onlyPreop documentation limited or incomplete due to emergent nature of procedure.  Airway Mallampati: III  TM Distance: >3 FB Neck ROM: Full    Dental  (+) Teeth Intact, Dental Advisory Given   Pulmonary  COVID + 1 week ago   Pulmonary exam normal breath sounds clear to auscultation       Cardiovascular hypertension, Pt. on medications and Pt. on home beta blockers  Rhythm:Regular Rate:Tachycardia     Neuro/Psych CVA    GI/Hepatic Neg liver ROS, GERD  Medicated,  Endo/Other  diabetes, Type obesity  Renal/GU negative Renal ROS     Musculoskeletal  (+) Arthritis ,   Abdominal   Peds  Hematology negative hematology ROS (+)   Anesthesia Other Findings Day of surgery medications reviewed with the patient.  Reproductive/Obstetrics                             Anesthesia Physical Anesthesia Plan  ASA: IV and emergent  Anesthesia Plan: General   Post-op Pain Management:    Induction: Intravenous, Rapid sequence and Cricoid pressure planned  PONV Risk Score and Plan: 2 and Treatment may vary due to age or medical condition  Airway Management Planned: Oral ETT  Additional Equipment:   Intra-op Plan:   Post-operative Plan: Post-operative intubation/ventilation  Informed Consent: I have reviewed the patients History and Physical, chart, labs and discussed the procedure including the risks, benefits and alternatives for the proposed anesthesia with the patient or authorized representative who has indicated his/her understanding and acceptance.     History available from chart only and Only emergency history available  Plan Discussed with:  CRNA  Anesthesia Plan Comments: (Pre-op evaluation completed after induction of anesthesia due to unknown COVID status and emergent nature of the procedure)       Anesthesia Quick Evaluation

## 2020-07-24 NOTE — ED Notes (Signed)
Needed Rapid Covid Swab-spoke to Interventional Radiologist @ 1630 to do one while patient is in IR-per Ali Molina, RN called by Marylene Land

## 2020-07-24 NOTE — Progress Notes (Signed)
Pt transported to  PACU from IR on the vent with no complications noted.

## 2020-07-24 NOTE — ED Triage Notes (Signed)
Pt here from home unable to talk weakness in his right arm according to his dad pt stopped talking at 1230 today at lunch , pt may have had some difficulty before that at 1030 but cant be certain

## 2020-07-24 NOTE — Anesthesia Procedure Notes (Signed)
Procedure Name: Intubation Date/Time: 07/24/2020 4:49 PM Performed by: Epifanio Lesches, CRNA Pre-anesthesia Checklist: Patient identified, Emergency Drugs available, Suction available and Patient being monitored Patient Re-evaluated:Patient Re-evaluated prior to induction Oxygen Delivery Method: Circle System Utilized Preoxygenation: Pre-oxygenation with 100% oxygen Induction Type: IV induction Laryngoscope Size: Glidescope and 4 Grade View: Grade I Tube type: Oral Tube size: 8.0 mm Number of attempts: 1 Airway Equipment and Method: Stylet,  Oral airway and Rigid stylet Placement Confirmation: ETT inserted through vocal cords under direct vision,  positive ETCO2 and breath sounds checked- equal and bilateral Secured at: 23 cm Tube secured with: Tape Dental Injury: Teeth and Oropharynx as per pre-operative assessment

## 2020-07-24 NOTE — Code Documentation (Signed)
Stroke Response Nurse Documentation Code Stroke Documentation  Justin Mason is a 40 y.o. M arriving to [place] ED via Private Vehicle on 07/24/2020 with PMH of HTN, HLD, DM. Code stroke was activated by ED following assessment in triage. Patient from home where he was LKW at 1030 when his father noted he began having a hard time finding the words, stopped speaking altogether at 1230. Patient taking No antithrombotic PTA. Stroke team met patient in CT. NIHSS 8, see documentation for details and code stroke times. Patient with disoriented, right facial droop and Global aphasia  on exam. The following imaging was completed:  CT, CTA head and neck, CTP. Patient is not a candidate for tPA due to being outside of treatment window. Patient is candidate for IR, patient taken to IR bay 8 for intubation and preparation for procedure. Patient escorted into IR suite. Bedside handoff with IR RN Mardene Celeste.    Lenore Manner  Stroke Response RN [phone]

## 2020-07-24 NOTE — H&P (Signed)
Neurology H&P  CC: Aphasia  History is obtained from: Patient's father  HPI: Justin Mason is a 40 y.o. male with a history of hypertension, hyperlipidemia, diabetes, morbid obesity who presents with difficulty speaking that started around 1030 this morning.  His father states that he started having difficulty with speech, and then around 1230 stopped speaking altogether.  He was brought in to the hospital where it was noted that he was aphasic and a code stroke was activated.  He was taken for an emergent CT angiogram which revealed significant intracranial atherosclerosis with an occluded left M1.  CT perfusion was suggestive of a large area of hypoperfusion without core infarct on the left and therefore he was taken for angiogram with consideration of treatment.  Unfortunately, at the time of angiogram it was discovered that the blockage was likely chronic and therefore no attempt at reopening it was performed.   LKW: 10:30 AM tpa given?: No, outside of window IR Thrombectomy?  Yes, attempted Modified Rankin Scale: 0-Completely asymptomatic and back to baseline post- stroke   ROS: Unable to obtain due to altered mental status.  Past Medical History:  Diagnosis Date  . Arthritis   . Cardiac murmur 05/24/2020  . Cellulitis 01/03/2015  . Cellulitis of right leg   . Cellulitis of right lower extremity 01/03/2015  . Essential hypertension   . GERD (gastroesophageal reflux disease)   . High cholesterol   . Hyperlipemia   . Hypertension   . Morbid obesity (HCC) 05/24/2020  . Rotator cuff tear, right 09/08/2011  . Sepsis (HCC) 01/03/2015  . Type 2 diabetes mellitus with hyperglycemia (HCC)      Family History  Problem Relation Age of Onset  . Anesthesia problems Sister   . Prostate cancer Other   . Colon cancer Other      Social History:  reports that he has never smoked. His smokeless tobacco use includes snuff. He reports current alcohol use of about 7.0 standard drinks of alcohol  per week. He reports that he does not use drugs.   Prior to Admission medications   Medication Sig Start Date End Date Taking? Authorizing Provider  allopurinol (ZYLOPRIM) 100 MG tablet Take 100 mg by mouth daily. 05/22/20   [provider]  amLODipine (NORVASC) 10 MG tablet Take 1 tablet (10 mg total) by mouth daily. 05/24/20 08/22/20  Revankar, Aundra Dubin, MD  atorvastatin (LIPITOR) 10 MG tablet Take 10 mg by mouth daily.    [provider]  cloNIDine (CATAPRES) 0.1 MG tablet Take 1 tablet (0.1 mg total) by mouth 2 (two) times daily. 07/12/20   Revankar, Aundra Dubin, MD  dapagliflozin propanediol (FARXIGA) 10 MG TABS tablet Take 10 mg by mouth daily.    [provider]  esomeprazole (NEXIUM) 20 MG capsule Take 20 mg by mouth daily.    [provider]  ibuprofen (ADVIL,MOTRIN) 400 MG tablet Take 400 mg by mouth every 6 (six) hours as needed (pain).     [provider]  meloxicam (MOBIC) 15 MG tablet Take 15 mg by mouth daily.    [provider]  metFORMIN (GLUCOPHAGE) 1000 MG tablet Take 1,000 mg by mouth 2 (two) times daily. 02/28/20   [provider]  metoprolol succinate (TOPROL-XL) 100 MG 24 hr tablet Take 100 mg by mouth 2 (two) times daily.    [provider]  olmesartan-hydrochlorothiazide (BENICAR HCT) 40-25 MG tablet Take 1 tablet by mouth daily.    [provider]  Exam: Current vital signs: BP (!) 127/97 (BP Location: Left Arm)   Pulse 80   Temp 98.3 F (36.8 C) (Oral)   Resp 20   Wt (!) 172.8 kg   SpO2 100%   BMI 50.26 kg/m    Physical Exam  Constitutional: Appears obese.  Psych: Affect appropriate to situation Eyes: No scleral injection HENT: No OP obstrucion Head: Normocephalic.  Cardiovascular: Normal rate and regular rhythm.  Respiratory: Effort normal and breath sounds normal to anterior ascultation GI: Soft.  No distension. There is no tenderness.  Skin: WDI  Neuro: Mental  Status: Patient is awake, alert, oriented to person, place, month, year, and situation. Patient is able to give a clear and coherent history. No signs of neglect He has a significant expressive aphasia, though he is able to follow commands readily.  He is unable to write. Cranial Nerves: II: Visual Fields are full. Pupils are equal, round, and reactive to light.   III,IV, VI: EOMI without ptosis or diploplia.  V: Facial sensation is symmetric to temperature VII: Facial movement with mild right facial weakness VIII: hearing is intact to voice X: Uvula elevates symmetrically XI: Shoulder shrug is symmetric. XII: tongue is midline without atrophy or fasciculations.  Motor: Tone is normal. Bulk is normal. 5/5 strength was present in all four extremities.  Sensory: Sensation is symmetric to light touch and temperature in the arms and legs. Cerebellar: No clear ataxia   I have reviewed labs in epic and the pertinent results are: CMP-relatively unremarkable other than mild elevation in LFTs and borderline glucose  I have reviewed the images obtained: CT/CTA/CTP-?  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   Secondary Diagnosis: Essential (primary) hypertension, Type 2 diabetes mellitus w/o complications and Morbid Obesity(BMI > 40)   Impression: 40 year old male with what I suspect is intracranial atherosclerotic disease with acute aphasia.  I suspect that he has had an acute ischemic stroke unfortunately he was outside the window for IV tPA and thrombectomy was not possible due to the nature of his vascular disease.  He will need to be closely monitored in the ICU and unfortunately he was not able to be extubated due to his COVID positivity.  Plan: - HgbA1c, fasting lipid panel - MRI  of the brain without contrast -  Frequent neuro checks - Echocardiogram - Prophylactic therapy-Antiplatelet med: Aspirin - dose 325mg  PO or 300mg  PR - Risk factor modification - Telemetry monitoring - PT consult, OT consult, Speech consult -SSI for DM - Stroke team to follow    This patient is critically ill and at significant risk of neurological worsening, death and care requires constant monitoring of vital signs, hemodynamics,respiratory and cardiac monitoring, neurological assessment, discussion with family, other specialists and medical decision making of high complexity. I spent 60 minutes of neurocritical care time  in the care of  this patient. This was time spent independent of any time provided by nurse practitioner or PA.  , MD [facility] [phone]  If 7pm- 7am, please page neurology on call as listed in AMION.
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
## 2020-07-24 NOTE — Transfer of Care (Signed)
Immediate Anesthesia Transfer of Care Note  Patient: Justin Mason  Procedure(s) Performed: IR WITH ANESTHESIA - CODE STROKE (N/A )  Patient Location: PACU  Anesthesia Type:General  Level of Consciousness: Patient remains intubated per anesthesia plan  Airway & Oxygen Therapy: Patient remains intubated per anesthesia plan and Patient placed on Ventilator (see vital sign flow sheet for setting)  Post-op Assessment: Report given to RN and Post -op Vital signs reviewed and stable  Post vital signs: Reviewed and stable  Last Vitals:  Vitals Value Taken Time  BP 90/52 07/24/20 1856  Temp    Pulse 81 07/24/20 1856  Resp 18 07/24/20 1856  SpO2 99 % 07/24/20 1856  Vitals shown include unvalidated device data.  Last Pain:  Vitals:   07/24/20 1522  TempSrc: Oral         Complications: No complications documented.

## 2020-07-24 NOTE — ED Provider Notes (Signed)
[facility]ENC[facility] Provider Note   CSN: [id number] Arrival date & time: 07/24/20  1411     History Chief Complaint  Patient presents with  . Code Stroke    Arn MedalJohn L Kuzniar is a 40 y.o. male.  Arn MedalJohn L Affeldt is a 40 y.o. male with a history of hypertension, hyperlipidemia, diabetes, obesity, GERD, who presents to the emergency department via POV for evaluation of difficulty speaking and weakness. On arrival patient is completely aphasic and unable to add any additional information to history. Code stroke initiated from triage. Patient was with his father who provides the majority of the history. Father reports that around 10:30 he started having some abnormal speech and difficulty speaking, but then at 12:30 while they were eating lunch he just stopped speaking altogether. He also noticed some facial droop on the right side and some right arm weakness, patient was able to walk and move his legs but has not been able to speak since 12:30 he has had no resolution of symptoms. When symptoms were not going away father brought him to the emergency department. Father also reports that he tested positive for COVID last week, unsure the specific day. Denies any other focal symptoms, patient was not complaining of any pain or headache prior to the symptoms beginning. Father is not currently present here in the emergency department, history obtained over the phone. History very limited.  Level 5 caveat: Aphasia        Past Medical History:  Diagnosis Date  . Arthritis   . Cardiac murmur 05/24/2020  . Cellulitis 01/03/2015  . Cellulitis of right leg   . Cellulitis of right lower extremity 01/03/2015  . Essential hypertension   . GERD (gastroesophageal reflux disease)   . High cholesterol   . Hyperlipemia   . Hypertension   . Morbid obesity (HCC) 05/24/2020  . Rotator cuff tear, right 09/08/2011  . Sepsis (HCC) 01/03/2015  . Type 2 diabetes mellitus with  hyperglycemia [facility](HCC)     Patient Active Problem List   Diagnosis Date Noted  . Morbid obesity (HCC) 05/24/2020  . Cardiac murmur 05/24/2020  . Arthritis   . High cholesterol   . Hyperlipemia   . Type 2 diabetes mellitus with hyperglycemia (HCC)   . Cellulitis 01/03/2015  . Cellulitis of right lower extremity 01/03/2015  . Sepsis (HCC) 01/03/2015  . Hypertension   . GERD (gastroesophageal reflux disease)   . Essential hypertension   . Cellulitis of right leg   . Rotator cuff tear, right 09/08/2011    Past Surgical History:  Procedure Laterality Date  . INCISION AND DRAINAGE FOOT  1991-1992   STEPPED ON NAIL / INFECTED WOUND  . SHOULDER SURGERY     September 08, 2011       Family History  Problem Relation Age of Onset  . Anesthesia problems Sister   . Prostate cancer Other   . Colon cancer Other     Social History   Tobacco Use  . Smoking status: Never Smoker  . Smokeless tobacco: Current User    Types: Snuff  Substance Use Topics  . Alcohol use: Yes    Alcohol/week: 7.0 standard drinks    Types: 7 Shots of liquor per week  . Drug use: No    Home Medications Prior to Admission medications   Medication Sig Start Date End Date Taking? Authorizing Provider  allopurinol (ZYLOPRIM) 100 MG tablet Take 100 mg by mouth daily. 05/22/20   [provider]  amLODipine (NORVASC) 10 MG tablet Take 1 tablet (10 mg total) by mouth daily. 05/24/20 08/22/20  Revankar, Aundra Dubin, MD  atorvastatin (LIPITOR) 10 MG tablet Take 10 mg by mouth daily.    [provider]  cloNIDine (CATAPRES) 0.1 MG tablet Take 1 tablet (0.1 mg total) by mouth 2 (two) times daily. 07/12/20   Revankar, Aundra Dubin, MD  dapagliflozin propanediol (FARXIGA) 10 MG TABS tablet Take 10 mg by mouth daily.    [provider]  esomeprazole (NEXIUM) 20 MG capsule Take 20 mg by mouth daily.    [provider]  ibuprofen (ADVIL,MOTRIN) 400 MG tablet Take 400 mg by mouth every 6 (six) hours as  needed (pain).     [provider]  meloxicam (MOBIC) 15 MG tablet Take 15 mg by mouth daily.    [provider]  metFORMIN (GLUCOPHAGE) 1000 MG tablet Take 1,000 mg by mouth 2 (two) times daily. 02/28/20   [provider]  metoprolol succinate (TOPROL-XL) 100 MG 24 hr tablet Take 100 mg by mouth 2 (two) times daily.    [provider]  olmesartan-hydrochlorothiazide (BENICAR HCT) 40-25 MG tablet Take 1 tablet by mouth daily.    [provider]    Allergies    Patient has no known allergies.  Review of Systems   Review of Systems  Unable to perform ROS: Patient nonverbal    Physical Exam Updated Vital Signs BP (!) 133/92   Pulse 75   Temp 98.3 F (36.8 C) (Oral)   Resp 16   SpO2 98%   Physical Exam Vitals and nursing note reviewed.  Constitutional:      General: He is not in acute distress.    Appearance: Normal appearance. He is well-developed and well-nourished. He is obese. He is ill-appearing. He is not diaphoretic.     Comments: Morbidly obese male is alert but aphasic, ill-appearing  HENT:     Head: Normocephalic and atraumatic.     Nose: Nose normal.     Mouth/Throat:     Mouth: Oropharynx is clear and moist.  Eyes:     General:        Right eye: No discharge.        Left eye: No discharge.     Extraocular Movements: EOM normal.  Cardiovascular:     Rate and Rhythm: Normal rate and regular rhythm.     Pulses: Normal pulses and intact distal pulses.     Heart sounds: Normal heart sounds.  Pulmonary:     Effort: Pulmonary effort is normal. No respiratory distress.     Breath sounds: Normal breath sounds.     Comments: Maintaining airway, respirations are equal and unlabored, normal O2 sats, patient aphasic, unable to assess speech, no signs of respiratory distress Abdominal:     General: There is no distension.     Palpations: Abdomen is soft.  Musculoskeletal:        General: No deformity or edema.     Cervical  back: Neck supple.     Right lower leg: No edema.     Left lower leg: No edema.  Skin:    General: Skin is warm and dry.     Capillary Refill: Capillary refill takes less than 2 seconds.  Neurological:     Mental Status: He is alert.     Comments: Patient aphasic but seems to understand speech and can follow some commands Some right facial droop noted Very mild weakness of the right  arm when compared to the left, bilateral lower extremities with normal strength. More detailed neurologic exam deferred to neurology who is at bedside   Psychiatric:     Comments: Unable to assess due to acuity and aphasia     ED Results / Procedures / Treatments   Labs (all labs ordered are listed, but only abnormal results are displayed) Labs Reviewed  CBC - Abnormal; Notable for the following components:      Result Value   RBC 5.83 (*)    Hemoglobin 18.0 (*)    All other components within normal limits  I-STAT CHEM 8, ED - Abnormal; Notable for the following components:   BUN 22 (*)    Glucose, Bld 158 (*)    Calcium, Ion 1.12 (*)    Hemoglobin 17.3 (*)    All other components within normal limits  DIFFERENTIAL  PROTIME-INR  APTT  COMPREHENSIVE METABOLIC PANEL  CBG MONITORING, ED    EKG None  Radiology CT Code Stroke CTA Head W/WO contrast  Result Date: 07/24/2020 CLINICAL DATA:  Focal neurological deficit, stroke suspected. Aphasia. EXAM: CT ANGIOGRAPHY HEAD AND NECK CT PERFUSION BRAIN TECHNIQUE: Multidetector CT imaging of the head and neck was performed using the standard protocol during bolus administration of intravenous contrast. Multiplanar CT image reconstructions and MIPs were obtained to evaluate the vascular anatomy. Carotid stenosis measurements (when applicable) are obtained utilizing NASCET criteria, using the distal internal carotid diameter as the denominator. Multiphase CT imaging of the brain was performed following IV bolus contrast injection. Subsequent parametric  perfusion maps were calculated using RAPID software. COMPARISON:  Head CT July 24, 2020 FINDINGS: CTA NECK FINDINGS Aortic arch: Common origin of the right common and innominate arteries from the aortic arch. Imaged portion shows no evidence of aneurysm or dissection. No significant stenosis of the major arch vessel origins. Right carotid system: No evidence of dissection, stenosis (50% or greater) or occlusion. Left carotid system: No evidence of dissection, stenosis (50% or greater) or occlusion. Vertebral arteries: Right dominant. Proximal left vertebral artery is is partially obscured by motion and streak artifacts. Otherwise, no evidence of dissection, stenosis (50% or greater) or occlusion. Skeleton: Degenerative changes of the cervical spine Other neck: Negative Upper chest: Scattered opacities within the bilateral upper lobes. Correlation with dedicated chest study suggested. Review of the MIP images confirms the above findings CTA HEAD FINDINGS Anterior circulation: Short segment occlusion of the proximal right M1/MCA with decrease caliber and opacification of the corresponding vascular tree. Occlusion of the left ICA terminus and proximal left M1 segment with decrease caliber and contrast opacification of the corresponding vascular tree. The bilateral ACA vascular trees are patent, although caliber appear diminished with luminal irregularity bilaterally. Posterior circulation: Intracranial bilateral vertebral arteries and basilar artery have normal or a noting the is caliber diffusely. Luminal irregularity the of the bilateral posterior cerebral arteries with moderate stenosis at the right P2 segment. Venous sinuses: As permitted by contrast timing, patent. Anatomic variants: Left fetal PCA. Review of the MIP images confirms the above findings CT Brain Perfusion Findings: ASPECTS: 10 CBF (<30%) Volume: 64mL Perfusion (Tmax>6.0s) volume: Mismatch Volume: Infarction Location:Not applicable  IMPRESSION: 1. Short segment occlusion of the proximal right M1/MCA with decrease caliber and opacification of the corresponding vascular tree. 2. Occlusion of the left ICA terminus and proximal left M1 segment with decrease caliber and contrast opacification of the corresponding vascular tree. 3. Luminal irregularity of the bilateral ACA and PCA vascular trees with moderate stenosis  at the right P2/PCA segment. 4. Diffuse intracranial vasculopathy may be related to severe atherosclerosis versus vasculitis. 5. Scattered opacities within the bilateral upper lobes. Correlation with dedicated chest study suggested. These results were called by telephone at the time of interpretation on 07/24/2020 at 4:12 pm to provider MCNEILL [facility] , who verbally acknowledged these results. Electronically Signed   By: Baldemar Lenis M.D.   On: 07/24/2020 16:27   CT Code Stroke CTA Neck W/WO contrast  Result Date: 07/24/2020 CLINICAL DATA:  Focal neurological deficit, stroke suspected. Aphasia. EXAM: CT ANGIOGRAPHY HEAD AND NECK CT PERFUSION BRAIN TECHNIQUE: Multidetector CT imaging of the head and neck was performed using the standard protocol during bolus administration of intravenous contrast. Multiplanar CT image reconstructions and MIPs were obtained to evaluate the vascular anatomy. Carotid stenosis measurements (when applicable) are obtained utilizing NASCET criteria, using the distal internal carotid diameter as the denominator. Multiphase CT imaging of the brain was performed following IV bolus contrast injection. Subsequent parametric perfusion maps were calculated using RAPID software. COMPARISON:  Head CT July 24, 2020 FINDINGS: CTA NECK FINDINGS Aortic arch: Common origin of the right common and innominate arteries from the aortic arch. Imaged portion shows no evidence of aneurysm or dissection. No significant stenosis of the major arch vessel origins. Right carotid system: No evidence of  dissection, stenosis (50% or greater) or occlusion. Left carotid system: No evidence of dissection, stenosis (50% or greater) or occlusion. Vertebral arteries: Right dominant. Proximal left vertebral artery is is partially obscured by motion and streak artifacts. Otherwise, no evidence of dissection, stenosis (50% or greater) or occlusion. Skeleton: Degenerative changes of the cervical spine Other neck: Negative Upper chest: Scattered opacities within the bilateral upper lobes. Correlation with dedicated chest study suggested. Review of the MIP images confirms the above findings CTA HEAD FINDINGS Anterior circulation: Short segment occlusion of the proximal right M1/MCA with decrease caliber and opacification of the corresponding vascular tree. Occlusion of the left ICA terminus and proximal left M1 segment with decrease caliber and contrast opacification of the corresponding vascular tree. The bilateral ACA vascular trees are patent, although caliber appear diminished with luminal irregularity bilaterally. Posterior circulation: Intracranial bilateral vertebral arteries and basilar artery have normal or a noting the is caliber diffusely. Luminal irregularity the of the bilateral posterior cerebral arteries with moderate stenosis at the right P2 segment. Venous sinuses: As permitted by contrast timing, patent. Anatomic variants: Left fetal PCA. Review of the MIP images confirms the above findings CT Brain Perfusion Findings: ASPECTS: 10 CBF (<30%) Volume: 42mL Perfusion (Tmax>6.0s) volume: Mismatch Volume: Infarction Location:Not applicable IMPRESSION: 1. Short segment occlusion of the proximal right M1/MCA with decrease caliber and opacification of the corresponding vascular tree. 2. Occlusion of the left ICA terminus and proximal left M1 segment with decrease caliber and contrast opacification of the corresponding vascular tree. 3. Luminal irregularity of the bilateral ACA and PCA vascular trees with  moderate stenosis at the right P2/PCA segment. 4. Diffuse intracranial vasculopathy may be related to severe atherosclerosis versus vasculitis. 5. Scattered opacities within the bilateral upper lobes. Correlation with dedicated chest study suggested. These results were called by telephone at the time of interpretation on 07/24/2020 at 4:12 pm to provider MCNEILL [facility] , who verbally acknowledged these results. Electronically Signed   By: Baldemar Lenis M.D.   On: 07/24/2020 16:27   CT Code Stroke Cerebral Perfusion with contrast  Result Date: 07/24/2020 CLINICAL DATA:  Focal neurological deficit, stroke  suspected. Aphasia. EXAM: CT ANGIOGRAPHY HEAD AND NECK CT PERFUSION BRAIN TECHNIQUE: Multidetector CT imaging of the head and neck was performed using the standard protocol during bolus administration of intravenous contrast. Multiplanar CT image reconstructions and MIPs were obtained to evaluate the vascular anatomy. Carotid stenosis measurements (when applicable) are obtained utilizing NASCET criteria, using the distal internal carotid diameter as the denominator. Multiphase CT imaging of the brain was performed following IV bolus contrast injection. Subsequent parametric perfusion maps were calculated using RAPID software. COMPARISON:  Head CT July 24, 2020 FINDINGS: CTA NECK FINDINGS Aortic arch: Common origin of the right common and innominate arteries from the aortic arch. Imaged portion shows no evidence of aneurysm or dissection. No significant stenosis of the major arch vessel origins. Right carotid system: No evidence of dissection, stenosis (50% or greater) or occlusion. Left carotid system: No evidence of dissection, stenosis (50% or greater) or occlusion. Vertebral arteries: Right dominant. Proximal left vertebral artery is is partially obscured by motion and streak artifacts. Otherwise, no evidence of dissection, stenosis (50% or greater) or occlusion. Skeleton: Degenerative  changes of the cervical spine Other neck: Negative Upper chest: Scattered opacities within the bilateral upper lobes. Correlation with dedicated chest study suggested. Review of the MIP images confirms the above findings CTA HEAD FINDINGS Anterior circulation: Short segment occlusion of the proximal right M1/MCA with decrease caliber and opacification of the corresponding vascular tree. Occlusion of the left ICA terminus and proximal left M1 segment with decrease caliber and contrast opacification of the corresponding vascular tree. The bilateral ACA vascular trees are patent, although caliber appear diminished with luminal irregularity bilaterally. Posterior circulation: Intracranial bilateral vertebral arteries and basilar artery have normal or a noting the is caliber diffusely. Luminal irregularity the of the bilateral posterior cerebral arteries with moderate stenosis at the right P2 segment. Venous sinuses: As permitted by contrast timing, patent. Anatomic variants: Left fetal PCA. Review of the MIP images confirms the above findings CT Brain Perfusion Findings: ASPECTS: 10 CBF (<30%) Volume: 0mL Perfusion (Tmax>6.0s) volume: Mismatch Volume: Infarction Location:Not applicable IMPRESSION: 1. Short segment occlusion of the proximal right M1/MCA with decrease caliber and opacification of the corresponding vascular tree. 2. Occlusion of the left ICA terminus and proximal left M1 segment with decrease caliber and contrast opacification of the corresponding vascular tree. 3. Luminal irregularity of the bilateral ACA and PCA vascular trees with moderate stenosis at the right P2/PCA segment. 4. Diffuse intracranial vasculopathy may be related to severe atherosclerosis versus vasculitis. 5. Scattered opacities within the bilateral upper lobes. Correlation with dedicated chest study suggested. These results were called by telephone at the time of interpretation on 07/24/2020 at 4:12 pm to provider MCNEILL  [facility] , who verbally acknowledged these results. Electronically Signed   By: Baldemar Lenis M.D.   On: 07/24/2020 16:27   CT HEAD CODE STROKE WO CONTRAST  Result Date: 07/24/2020 CLINICAL DATA:  Code stroke.  Right arm weakness EXAM: CT HEAD WITHOUT CONTRAST TECHNIQUE: Contiguous axial images were obtained from the base of the skull through the vertex without intravenous contrast. COMPARISON:  None. FINDINGS: Brain: Small foci of hypodensity within the left caudate and left thalamus, may represent small infarcts, age indeterminate. No large acute territorial infarct, hemorrhage, hydrocephalus, extra-axial collection or mass lesion. Vascular: No hyperdense vessel or unexpected calcification. Skull: Normal. Negative for fracture or focal lesion. Sinuses/Orbits: No acute finding. ASPECTS [facility] Stroke Program Early CT Score) - Ganglionic level infarction (caudate, lentiform nuclei, internal capsule,  insula, M1-M3 cortex): 6 - Supraganglionic infarction (M4-M6 cortex): 3 Total score (0-10 with 10 being normal): 9 IMPRESSION: 1. Small foci of hypodensity within the left caudate and left thalamus, may represent small infarcts, age indeterminate. 2. Aspect score is 9. These results were called by telephone at the time of interpretation on 07/24/2020 at 3:57 pm to provider Dr. Amada JupiterKirkpatrick, who verbally acknowledged these results. Electronically Signed   By: Baldemar LenisKatyucia  De Macedo Rodrigues M.D.   On: 07/24/2020 15:59    Procedures .Critical Care Performed by: Dartha LodgeFord, Ilham Roughton N, PA-C Authorized by: Dartha LodgeFord, Siren Porrata N, PA-C   Critical care provider statement:    Critical care time (minutes):  45   Critical care was necessary to treat or prevent imminent or life-threatening deterioration of the following conditions:  CNS failure or compromise (Stroke)   Critical care was time spent personally by me on the following activities:  Discussions with consultants, evaluation of patient's response to  treatment, examination of patient, ordering and performing treatments and interventions, ordering and review of laboratory studies, ordering and review of radiographic studies, pulse oximetry, re-evaluation of patient's condition, obtaining history from patient or surrogate and review of old charts   (including critical care time)  Medications Ordered in ED Medications  sodium chloride flush (NS) 0.9 % injection 3 mL (has no administration in time range)    ED Course  I have reviewed the triage vital signs and the nursing notes.  Pertinent labs & imaging results that were available during my care of the patient were reviewed by me and considered in my medical decision making (see chart for details).    MDM Rules/Calculators/A&P                         40 year old male arrives POV for aphasia and weakness.  As soon as he was evaluated in triage code stroke was initiated.  I saw and evaluated patient in CT with neurology at bedside as well.  Patient completely aphasic but does seem to understand some commands, noted to have some right-sided facial droop and slight right upper extremity weakness as well.  Code stroke labs and head CT ordered.  Dr. Amada JupiterKirkpatrick and CT, also would like CTA of the head and neck and CT perfusion study.  Last known well time 10:30 AM so outside of tPA window, but with LVO positive symptoms may be a candidate for IR.  Per father patient tested positive for COVID sometime last week.  On arrival patient maintaining airway, with no hypoxia.   CT Noncon of the head shows small foci of hypodensity within the left caudate and left thalamus which may represent age-indeterminate infarcts.  CTA of the head with short segment occlusion of the right M1 MCA with decreased caliber and opacification, there is also occlusion of the left ICA terminus and proximal left M1 segment with decreased caliber and contrast opacification there is some luminal irregularity of bilateral ACA and PCA  vascular trees, diffuse intracranial vasculopathy may be due to severe atherosclerosis versus vasculitis.  After CT perfusion neurology plans to take patient to IR suite for potential intervention.  Afterwards patient will be admitted to neuro ICU.  COVID positive status confirmed with rapid antigen test.  Patient with glucose of 164 but no other significant electrolyte derangements, mild transaminitis, no leukocytosis, elevated hemoglobin of 18.  Patient admitted to neurology service and taken directly to IR suite.  Patient was only in the room for short time after  returning from CT prior to going to IR.  Admitted to neuro ICU.  Final Clinical Impression(s) / ED Diagnoses Final diagnoses:  Stroke (cerebrum) [facility])  Cerebrovascular accident (CVA) due to thrombosis of precerebral artery [facility])    Rx / DC Orders ED Discharge Orders    None       Legrand Rams 07/24/20 1937    Blane Ohara, MD 07/26/20 [phone]

## 2020-07-25 ENCOUNTER — Inpatient Hospital Stay (HOSPITAL_COMMUNITY): Payer: Managed Care, Other (non HMO)

## 2020-07-25 ENCOUNTER — Encounter (HOSPITAL_COMMUNITY): Payer: Self-pay | Admitting: Radiology

## 2020-07-25 DIAGNOSIS — I6389 Other cerebral infarction: Secondary | ICD-10-CM | POA: Diagnosis not present

## 2020-07-25 DIAGNOSIS — I6602 Occlusion and stenosis of left middle cerebral artery: Secondary | ICD-10-CM | POA: Diagnosis not present

## 2020-07-25 LAB — LIPID PANEL
Cholesterol: 93 mg/dL (ref 0–200)
HDL: 19 mg/dL — ABNORMAL LOW (ref >40)
LDL Cholesterol: 36 mg/dL (ref 0–99)
Total CHOL/HDL Ratio: 4.9 RATIO
Triglycerides: 190 mg/dL — ABNORMAL HIGH (ref <150)
VLDL: 38 mg/dL (ref 0–40)

## 2020-07-25 LAB — BLOOD GAS, ARTERIAL
Acid-Base Excess: 0.7 mmol/L (ref 0.0–2.0)
Bicarbonate: 25.6 mmol/L (ref 20.0–28.0)
Drawn by: 55062
FIO2: 40
O2 Saturation: 98.6 %
Patient temperature: 36.1
pCO2 arterial: 44.5 mmHg (ref 32.0–48.0)
pH, Arterial: 7.372 (ref 7.350–7.450)
pO2, Arterial: 136 mmHg — ABNORMAL HIGH (ref 83.0–108.0)

## 2020-07-25 LAB — MRSA PCR SCREENING: MRSA by PCR: NEGATIVE

## 2020-07-25 LAB — BASIC METABOLIC PANEL
Anion gap: 13 (ref 5–15)
BUN: 17 mg/dL (ref 6–20)
CO2: 23 mmol/L (ref 22–32)
Calcium: 9 mg/dL (ref 8.9–10.3)
Chloride: 103 mmol/L (ref 98–111)
Creatinine, Ser: 1.05 mg/dL (ref 0.61–1.24)
GFR, Estimated: >60 mL/min (ref >60)
Glucose, Bld: 165 mg/dL — ABNORMAL HIGH (ref 70–99)
Potassium: 4.1 mmol/L (ref 3.5–5.1)
Sodium: 139 mmol/L (ref 135–145)

## 2020-07-25 LAB — RAPID URINE DRUG SCREEN, HOSP PERFORMED
Amphetamines: NOT DETECTED
Barbiturates: NOT DETECTED
Benzodiazepines: NOT DETECTED
Cocaine: NOT DETECTED
Opiates: NOT DETECTED
Tetrahydrocannabinol: NOT DETECTED

## 2020-07-25 LAB — ECHOCARDIOGRAM COMPLETE
Area-P 1/2: 2.6 cm2
Calc EF: 59.2 %
S' Lateral: 3.3 cm
Single Plane A2C EF: 57.7 %
Single Plane A4C EF: 56.8 %
Weight: 6088.22 oz

## 2020-07-25 LAB — CBC WITH DIFFERENTIAL/PLATELET
Abs Immature Granulocytes: 0.03 10*3/uL (ref 0.00–0.07)
Basophils Absolute: 0 10*3/uL (ref 0.0–0.1)
Basophils Relative: 0 %
Eosinophils Absolute: 0 10*3/uL (ref 0.0–0.5)
Eosinophils Relative: 0 %
HCT: 43.1 % (ref 39.0–52.0)
Hemoglobin: 15.1 g/dL (ref 13.0–17.0)
Immature Granulocytes: 1 %
Lymphocytes Relative: 22 %
Lymphs Abs: 0.9 10*3/uL (ref 0.7–4.0)
MCH: 30.6 pg (ref 26.0–34.0)
MCHC: 35 g/dL (ref 30.0–36.0)
MCV: 87.2 fL (ref 80.0–100.0)
Monocytes Absolute: 0.3 10*3/uL (ref 0.1–1.0)
Monocytes Relative: 8 %
Neutro Abs: 2.9 10*3/uL (ref 1.7–7.7)
Neutrophils Relative %: 69 %
Platelets: 173 10*3/uL (ref 150–400)
RBC: 4.94 MIL/uL (ref 4.22–5.81)
RDW: 13.1 % (ref 11.5–15.5)
WBC: 4.2 10*3/uL (ref 4.0–10.5)
nRBC: 0 % (ref 0.0–0.2)

## 2020-07-25 LAB — GLUCOSE, CAPILLARY
Glucose-Capillary: 123 mg/dL — ABNORMAL HIGH (ref 70–99)
Glucose-Capillary: 147 mg/dL — ABNORMAL HIGH (ref 70–99)
Glucose-Capillary: 142 mg/dL — ABNORMAL HIGH (ref 70–99)
Glucose-Capillary: 151 mg/dL — ABNORMAL HIGH (ref 70–99)
Glucose-Capillary: 147 mg/dL — ABNORMAL HIGH (ref 70–99)
Glucose-Capillary: 140 mg/dL — ABNORMAL HIGH (ref 70–99)
Glucose-Capillary: 149 mg/dL — ABNORMAL HIGH (ref 70–99)

## 2020-07-25 LAB — HIV ANTIBODY (ROUTINE TESTING W REFLEX): HIV Screen 4th Generation wRfx: NONREACTIVE

## 2020-07-25 LAB — HEMOGLOBIN A1C
Hgb A1c MFr Bld: 6.2 % — ABNORMAL HIGH (ref 4.8–5.6)
Mean Plasma Glucose: 131.24 mg/dL

## 2020-07-25 LAB — LACTIC ACID, PLASMA: Lactic Acid, Venous: 1.6 mmol/L (ref 0.5–1.9)

## 2020-07-25 IMAGING — MR MR HEAD W/O CM
12 of 13 series · 44 of 48 positions shown · non-contrast
Comparison: [DATE] and prior.

CLINICAL DATA: Stroke, follow up

EXAM:
MRI HEAD WITHOUT CONTRAST
MRA HEAD WITHOUT CONTRAST
TECHNIQUE: Multiplanar, multiecho pulse sequences of the brain and surrounding
structures were obtained without intravenous contrast. Angiographic
images of the head were obtained using MRA technique without
contrast.

[Series 15: DWI · axial · 3.0mm · 0.88mm/px · z∈[-174,-21]mm · 8 of 104 slices shown (1 of 4)]
[im 1/104]
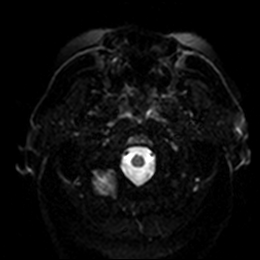
[im 15/104]
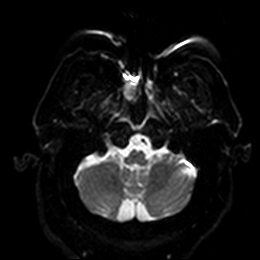
[im 30/104]
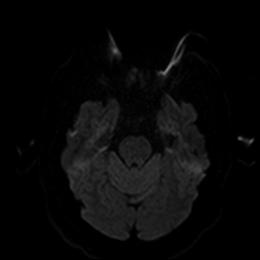
[im 45/104]
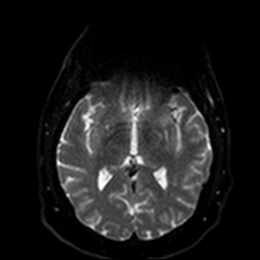
[im 59/104]
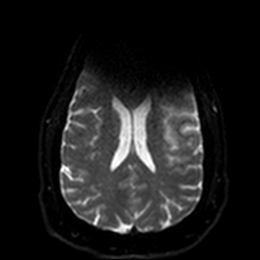
[im 74/104]
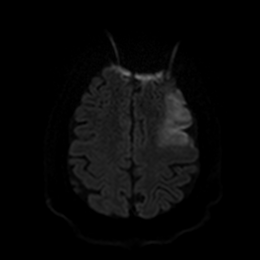
[im 89/104]
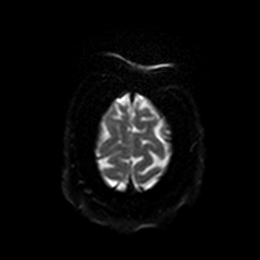
[im 104/104]
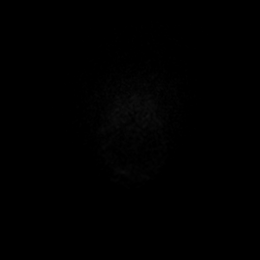

[Series 16: DWI · axial · 3.0mm · 0.88mm/px · z∈[-174,-21]mm · 4 of 52 slices shown (2 of 4)]
[im 1/52]
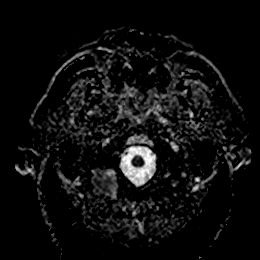
[im 18/52]
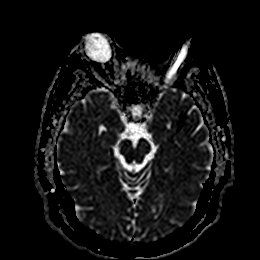
[im 35/52]
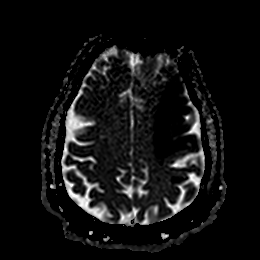
[im 52/52]
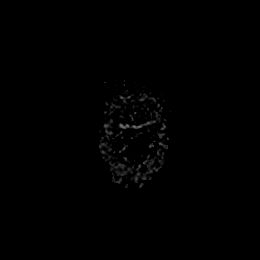

[Series 17: DWI · coronal · 4.0mm · 0.88mm/px · 5 of 75 slices shown (3 of 4)]
[im 1/75]
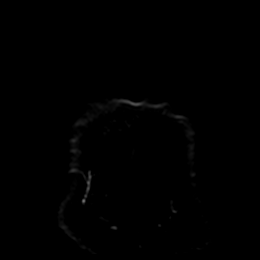
[im 19/75]
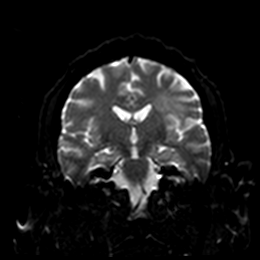
[im 38/75]
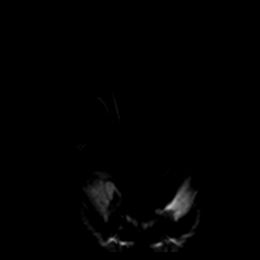
[im 56/75]
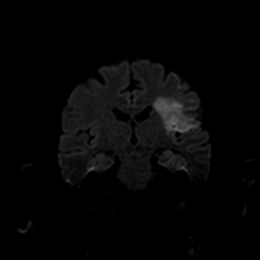
[im 75/75]
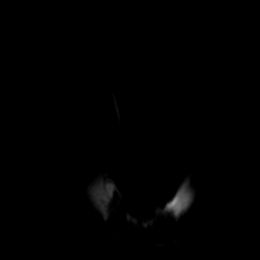

[Series 18: DWI · coronal · 4.0mm · 0.88mm/px · 3 of 38 slices shown (4 of 4)]
[im 1/38]
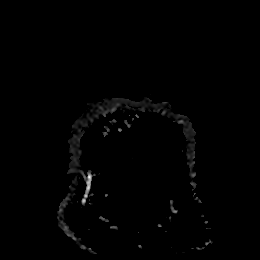
[im 19/38]
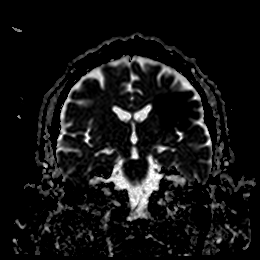
[im 38/38]
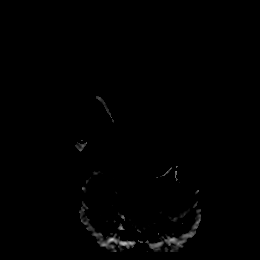

[Series 19: T1 · sagittal · 5.0mm · 0.75mm/px · 2 of 23 slices shown]
[im 1/23]
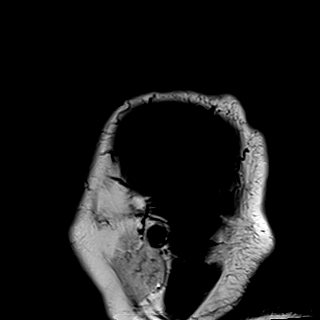
[im 23/23]
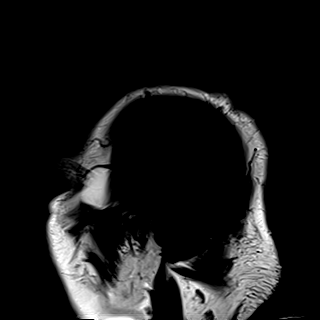

[Series 20: T2 · axial · 5.0mm · 0.75mm/px · z∈[-159,-11]mm · 2 of 26 slices shown (1 of 2)]
[im 1/26]
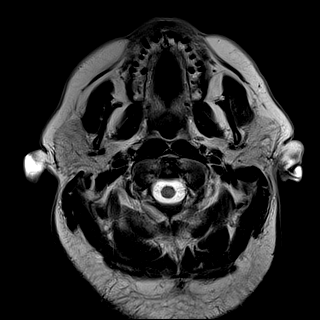
[im 26/26]
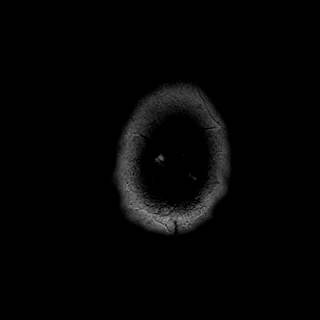

[Series 21: FLAIR · axial · 5.0mm · 0.47mm/px · z∈[-159,-11]mm · 2 of 26 slices shown]
[im 1/26]
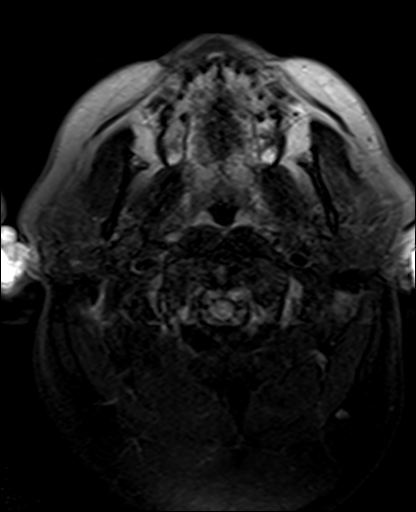
[im 26/26]
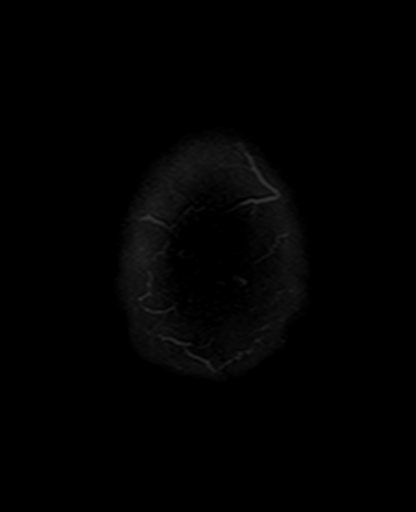

[Series 22: mag_images · axial · 3.0mm · 0.94mm/px · z∈[-183,-8]mm · 4 of 60 slices shown]
[im 1/60]
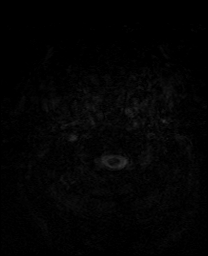
[im 20/60]
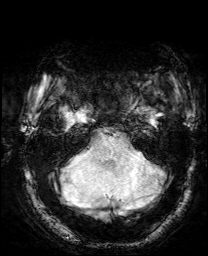
[im 40/60]
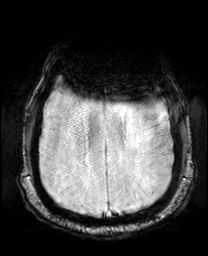
[im 60/60]
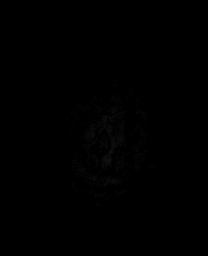

[Series 23: pha_images · axial · 3.0mm · 0.94mm/px · z∈[-180,-8]mm · 4 of 59 slices shown]
[im 1/59]
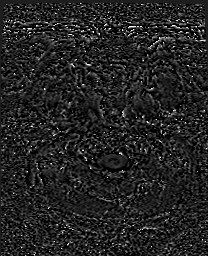
[im 20/59]
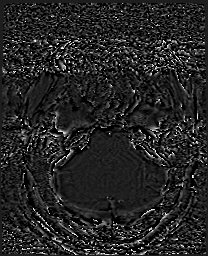
[im 39/59]
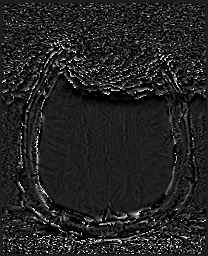
[im 59/59]
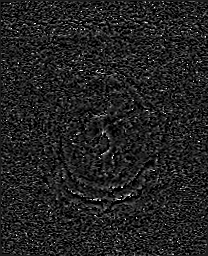

[Series 24: swi_images · axial · 3.0mm · 0.94mm/px · z∈[-183,-8]mm · 4 of 60 slices shown]
[im 1/60]
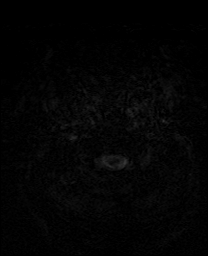
[im 20/60]
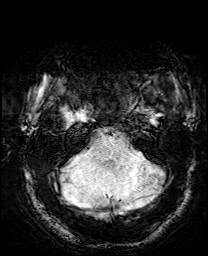
[im 40/60]
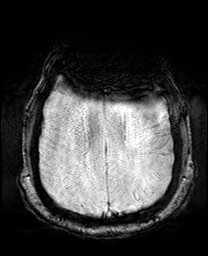
[im 60/60]
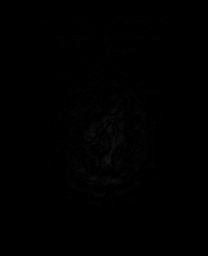

[Series 25: mip_images(sw) · axial · 24.0mm · 0.94mm/px · z∈[-173,-19]mm · 4 of 53 slices shown]
[im 1/53]
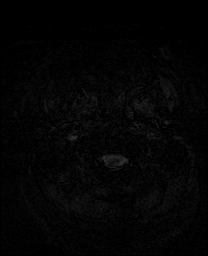
[im 18/53]
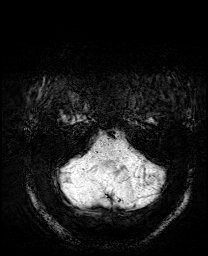
[im 35/53]
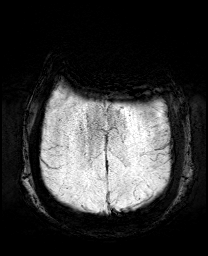
[im 53/53]
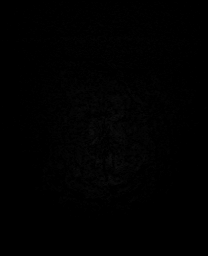

[Series 27: T2 · coronal · 5.0mm · 0.72mm/px · 2 of 28 slices shown (2 of 2)]
[im 1/28]
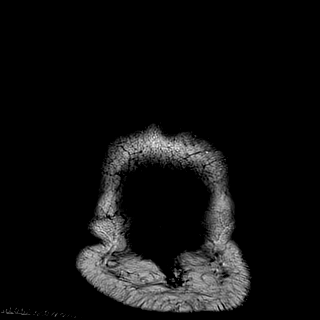
[im 28/28]
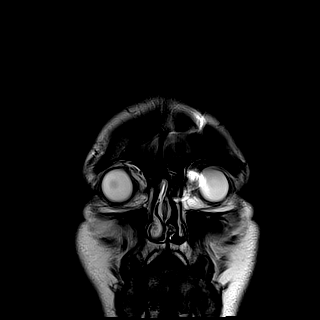

[44 of 48 positions shown; findings below may reference images not displayed]

FINDINGS: MRI HEAD FINDINGS

Brain: Sequela of acute left MCA territory infarct. Tiny right
parietal restricted diffusion may be artifactual versus an
additional acute insult. No intracranial hemorrhage. No midline
shift, ventriculomegaly or extra-axial fluid collection. Incidental
midline retrocerebellar arachnoid cyst without significant mass
effect or parenchymal edema.

Vascular: Please see MRA.

Skull and upper cervical spine: Normal marrow signal.

Sinuses/Orbits: Grossly normal orbits. Pneumatized paranasal sinuses
and mastoid air cells.

Other: Please note susceptibility artifact limits evaluation of the
anterior cranial fossa and face.

MRA HEAD FINDINGS

Anterior circulation: Short segment occlusion of the right M1
segment, unchanged. Distal right MCA branches remain patent.Proximal
left M1 segment occlusion with severely diminished opacification of
the distal left MCA branches. Patent right ICA. Occlusion of the
left ICA terminus and proximal left A1 segment. Luminal irregularity
of the distal left A1 segment. Visualized bilateral ACAs are
otherwise patent.

Posterior circulation: Patent V4 segments and PICA. Patent basilar
and superior cerebellar arteries. Fetal origin of the left PCA.
Right PCA appears patent and without significant luminal narrowing.

Anatomic variants: Please see above.
IMPRESSION: Acute left MCA territory infarct. Occlusions involving the left ICA
terminus and left M1 segment with diminished opacification of distal
left MCA branches.

Short-segment right M1 occlusion is unchanged.

Left A1 segment proximal occlusion and vessel wall irregularity,
unchanged.

Smaller 2-3 mm acute right parietal infarct versus artifact. No
intracranial hemorrhage.

These results were called by telephone at the time of interpretation
on [DATE] at [DATE] to provider Dr. HANG, Who verbally
acknowledged these results.

## 2020-07-25 IMAGING — MR MR MRA HEAD W/O CM
1 series · 19 of 48 positions shown · non-contrast
Comparison: [DATE] and prior.

CLINICAL DATA: Stroke, follow up

EXAM:
MRI HEAD WITHOUT CONTRAST
MRA HEAD WITHOUT CONTRAST
TECHNIQUE: Multiplanar, multiecho pulse sequences of the brain and surrounding
structures were obtained without intravenous contrast. Angiographic
images of the head were obtained using MRA technique without
contrast.

[Series 15: 3d cow · axial · 0.5mm · 0.45mm/px · z∈[-171,-90]mm · 19 of 172 slices shown]
[im 1/172]
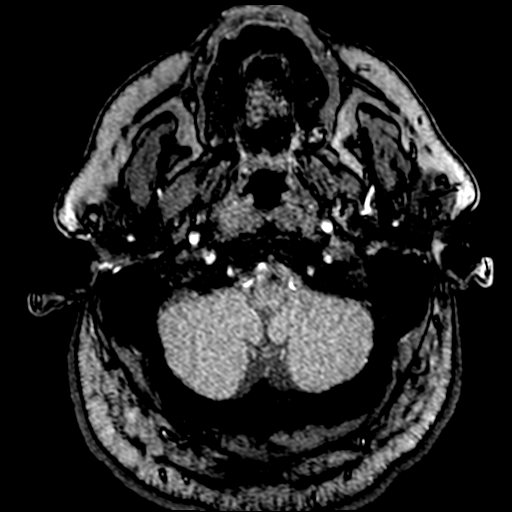
[im 4/172]
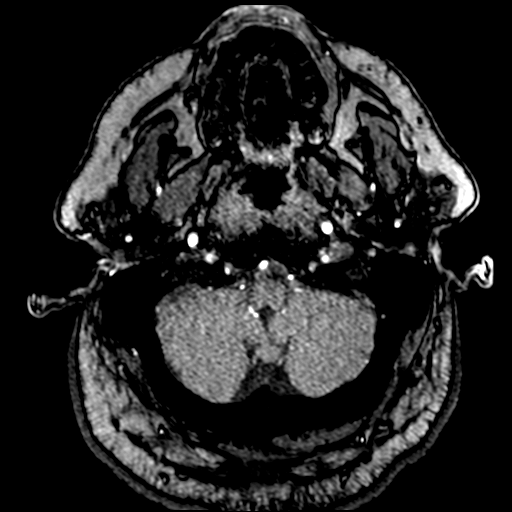
[im 8/172]
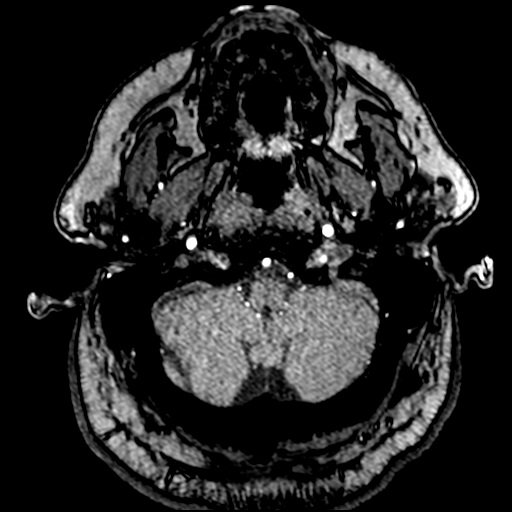
[im 11/172]
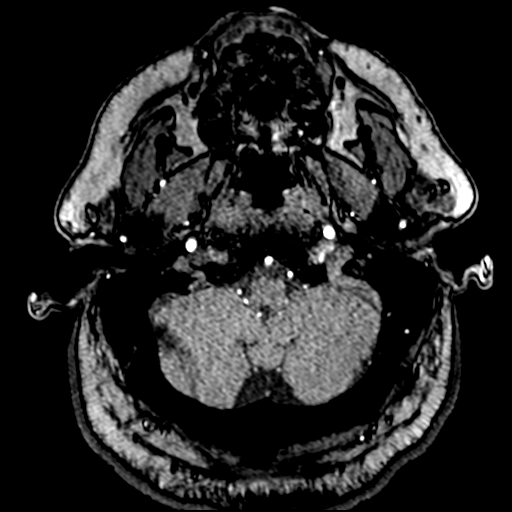
[im 15/172]
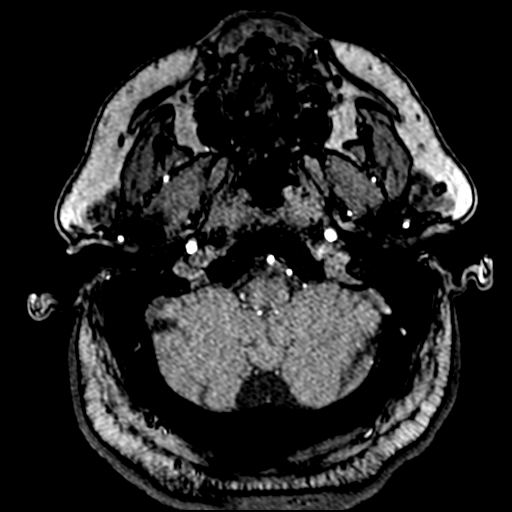
[im 19/172]
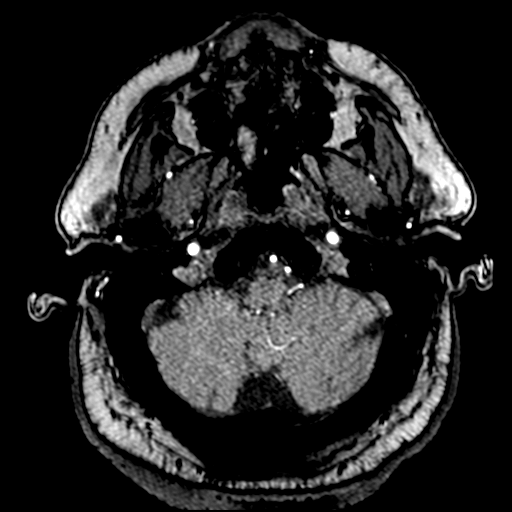
[im 22/172]
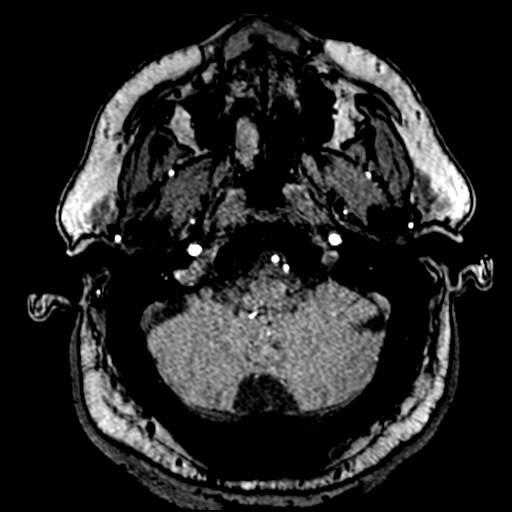
[im 26/172]
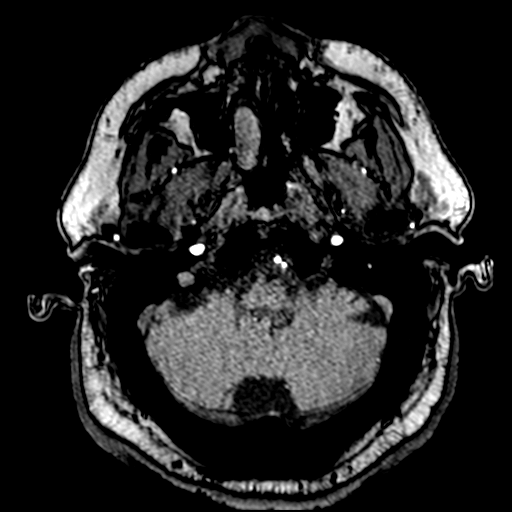
[im 30/172]
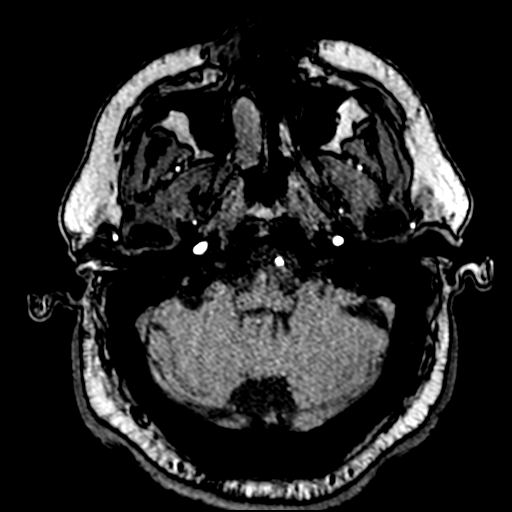
[im 33/172]
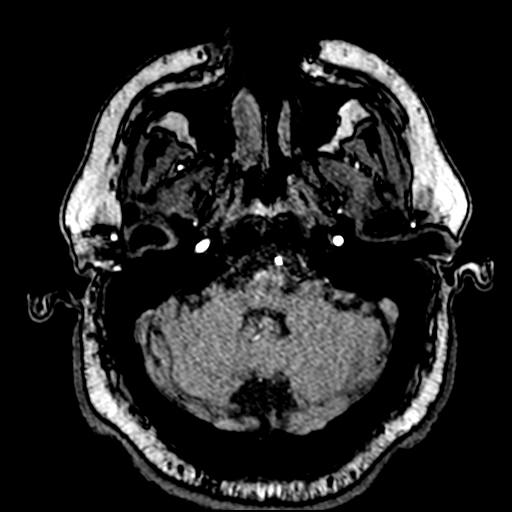
[im 37/172]
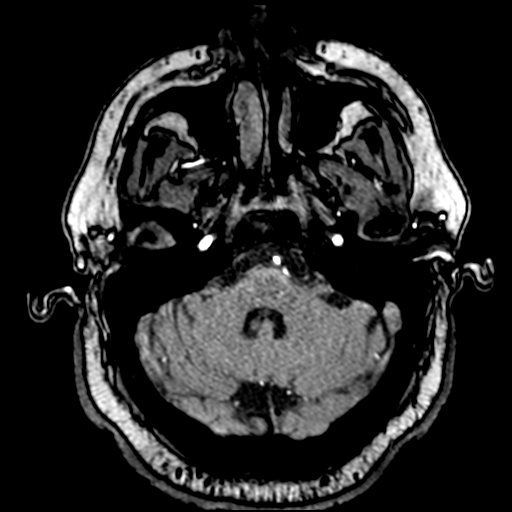
[im 55/172]
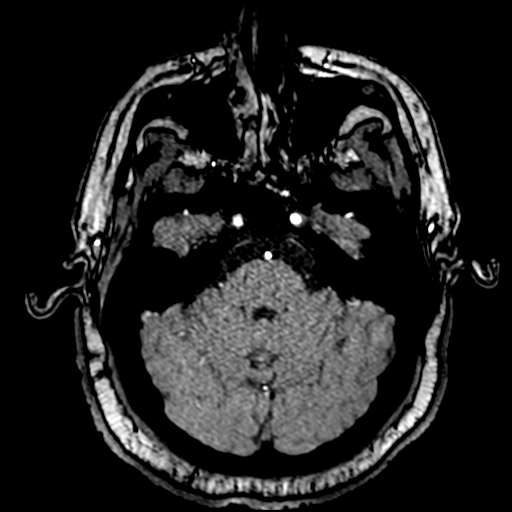
[im 77/172]
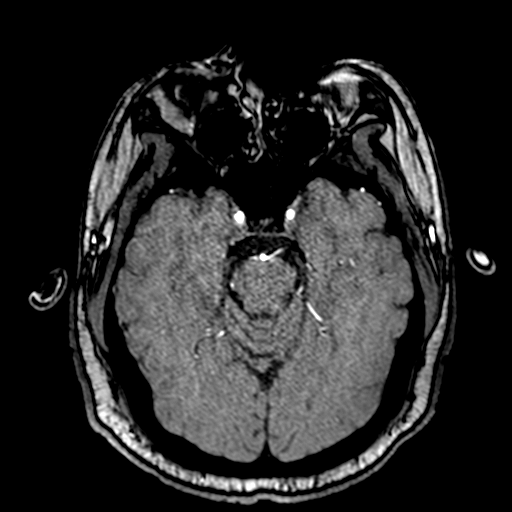
[im 88/172]
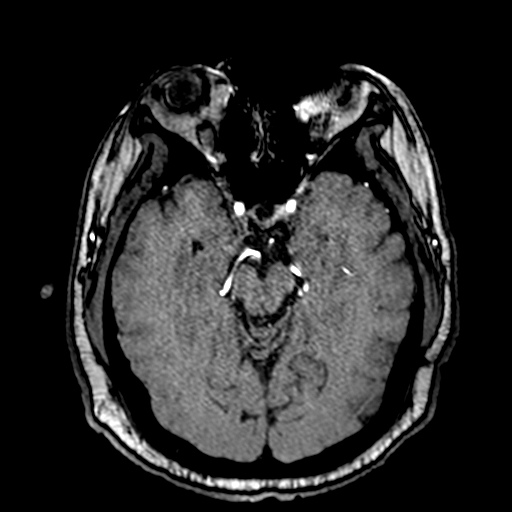
[im 99/172]
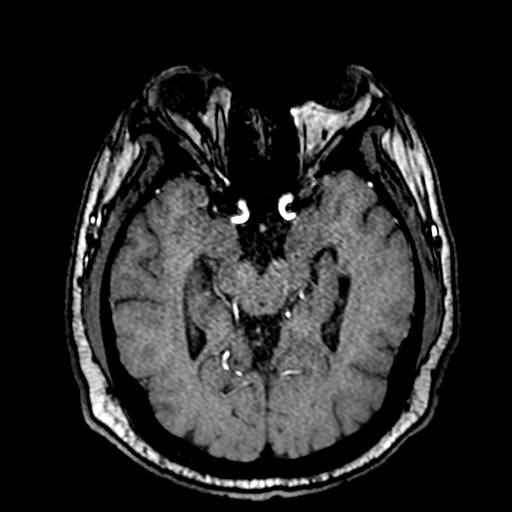
[im 121/172]
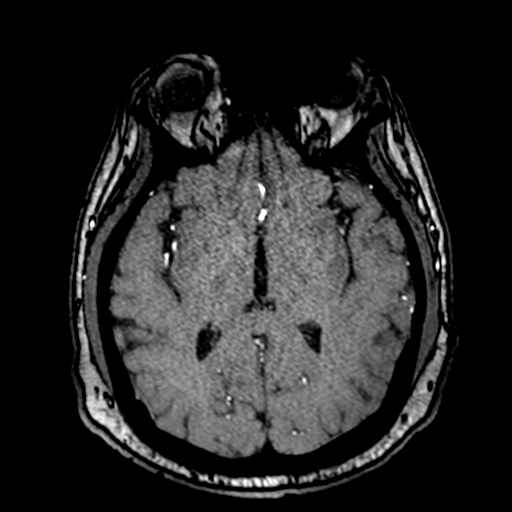
[im 142/172]
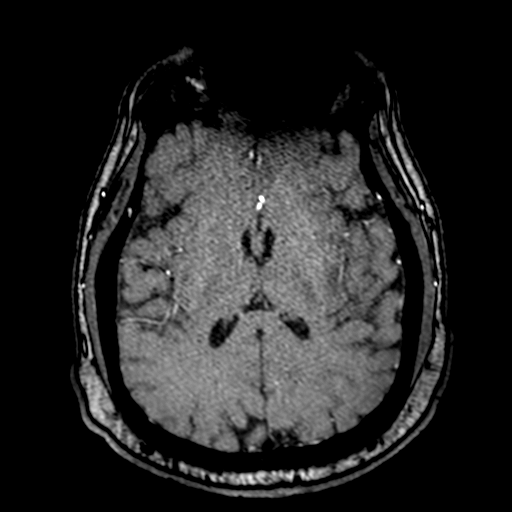
[im 146/172]
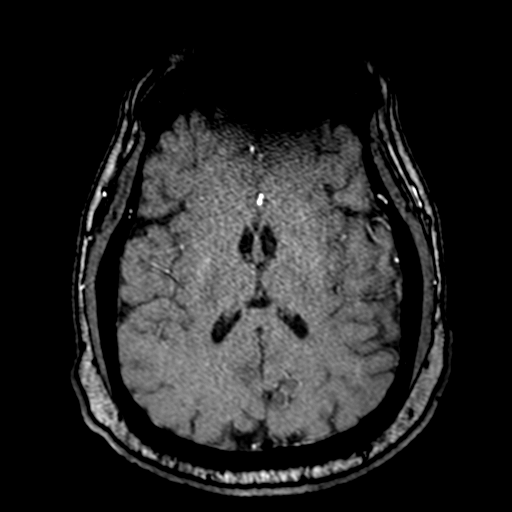
[im 164/172]
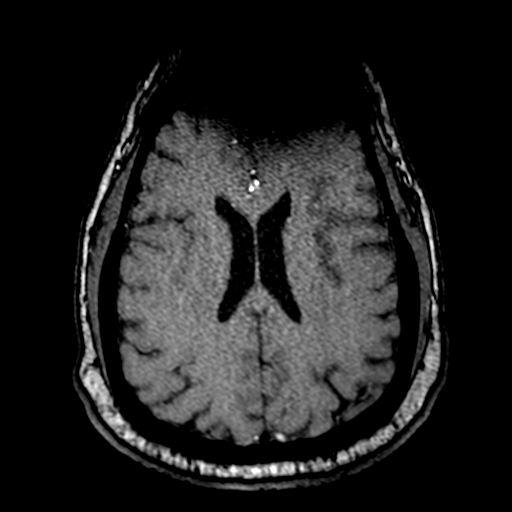

[19 of 48 positions shown; findings below may reference images not displayed]

FINDINGS: MRI HEAD FINDINGS

Brain: Sequela of acute left MCA territory infarct. Tiny right
parietal restricted diffusion may be artifactual versus an
additional acute insult. No intracranial hemorrhage. No midline
shift, ventriculomegaly or extra-axial fluid collection. Incidental
midline retrocerebellar arachnoid cyst without significant mass
effect or parenchymal edema.

Vascular: Please see MRA.

Skull and upper cervical spine: Normal marrow signal.

Sinuses/Orbits: Grossly normal orbits. Pneumatized paranasal sinuses
and mastoid air cells.

Other: Please note susceptibility artifact limits evaluation of the
anterior cranial fossa and face.

MRA HEAD FINDINGS

Anterior circulation: Short segment occlusion of the right M1
segment, unchanged. Distal right MCA branches remain patent.Proximal
left M1 segment occlusion with severely diminished opacification of
the distal left MCA branches. Patent right ICA. Occlusion of the
left ICA terminus and proximal left A1 segment. Luminal irregularity
of the distal left A1 segment. Visualized bilateral ACAs are
otherwise patent.

Posterior circulation: Patent V4 segments and PICA. Patent basilar
and superior cerebellar arteries. Fetal origin of the left PCA.
Right PCA appears patent and without significant luminal narrowing.

Anatomic variants: Please see above.
IMPRESSION: Acute left MCA territory infarct. Occlusions involving the left ICA
terminus and left M1 segment with diminished opacification of distal
left MCA branches.

Short-segment right M1 occlusion is unchanged.

Left A1 segment proximal occlusion and vessel wall irregularity,
unchanged.

Smaller 2-3 mm acute right parietal infarct versus artifact. No
intracranial hemorrhage.

These results were called by telephone at the time of interpretation
on [DATE] at [DATE] to provider Dr. HANG, Who verbally
acknowledged these results.

## 2020-07-25 MED ORDER — PERFLUTREN LIPID MICROSPHERE
1.0000 mL | INTRAVENOUS | Status: AC | PRN
  Administered 2020-07-25: 2 mL via INTRAVENOUS
  Filled 2020-07-25: qty 10

## 2020-07-25 MED ORDER — CHLORHEXIDINE GLUCONATE CLOTH 2 % EX PADS
6.0000 | MEDICATED_PAD | Freq: Every day | CUTANEOUS | Status: DC
  Administered 2020-07-26 – 2020-08-04 (×6): 6 via TOPICAL

## 2020-07-25 MED ORDER — ORAL CARE MOUTH RINSE
15.0000 mL | OROMUCOSAL | Status: DC
  Administered 2020-07-25 (×2): 15 mL via OROMUCOSAL

## 2020-07-25 MED ORDER — ROSUVASTATIN CALCIUM 5 MG PO TABS
10.0000 mg | ORAL_TABLET | Freq: Every day | ORAL | Status: DC
  Administered 2020-07-26 – 2020-08-09 (×15): 10 mg via ORAL
  Filled 2020-07-25 (×15): qty 2

## 2020-07-25 MED ORDER — PROPOFOL 1000 MG/100ML IV EMUL
INTRAVENOUS | Status: AC
  Filled 2020-07-25: qty 100

## 2020-07-25 MED ORDER — CHLORHEXIDINE GLUCONATE 0.12% ORAL RINSE (MEDLINE KIT)
15.0000 mL | Freq: Two times a day (BID) | OROMUCOSAL | Status: DC
  Administered 2020-07-25 (×3): 15 mL via OROMUCOSAL

## 2020-07-25 MED ORDER — ORAL CARE MOUTH RINSE
15.0000 mL | Freq: Two times a day (BID) | OROMUCOSAL | Status: DC
  Administered 2020-07-25 – 2020-08-09 (×27): 15 mL via OROMUCOSAL

## 2020-07-25 NOTE — Evaluation (Signed)
Clinical/Bedside Swallow Evaluation Patient Details  Name: Justin Mason MRN: [id number] Date of Birth: Jul 09, 1980  Today's Date: 07/25/2020 Time: SLP Start Time (ACUTE ONLY): 1436 SLP Stop Time (ACUTE ONLY): 1446 SLP Time Calculation (min) (ACUTE ONLY): 10 min  Past Medical History:  Past Medical History:  Diagnosis Date  . Arthritis   . Cardiac murmur 05/24/2020  . Cellulitis 01/03/2015  . Cellulitis of right leg   . Cellulitis of right lower extremity 01/03/2015  . Essential hypertension   . GERD (gastroesophageal reflux disease)   . High cholesterol   . Hyperlipemia   . Hypertension   . Morbid obesity (HCC) 05/24/2020  . Rotator cuff tear, right 09/08/2011  . Sepsis (HCC) 01/03/2015  . Type 2 diabetes mellitus with hyperglycemia [facility])    Past Surgical History:  Past Surgical History:  Procedure Laterality Date  . INCISION AND DRAINAGE FOOT  1991-1992   STEPPED ON NAIL / INFECTED WOUND  . RADIOLOGY WITH ANESTHESIA N/A 07/24/2020   Procedure: IR WITH ANESTHESIA - CODE STROKE;  Surgeon: Radiologist, Medication, MD;  Location: [facility] OR;  Service: Radiology;  Laterality: N/A;  . SHOULDER SURGERY     September 08, 2011   HPI:  Justin Mason is a 40 y.o. male with a history of hypertension, hyperlipidemia, diabetes, morbid obesity who presents with difficulty speaking. CT angiogram which revealed significant intracranial atherosclerosis with an occluded left M1.  CT perfusion was suggestive of a large area of hypoperfusion without core infarct on the left. CT small foci of hypodensity within the left caudate and left thalamus, may represent small infarcts, age indeterminate. Per MD note, at the time of angiogram it was discovered that the blockage was likely chronic and therefore no attempt at reopening it was performed.   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 He is nonverbal  presently and is  unable to phonate or cough on command. 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 Initiated swallow with right labial leakage and one delayed cough.Instrumental testing needed to objectively evaluate pharyngeal mechanics of swallow s/p significant deficits with stroke. Plan for FEES tomorrow 11:30 (due to body habitus). SLP Visit Diagnosis: Dysphagia, unspecified (R13.10)    Aspiration Risk  Mild aspiration risk;Moderate aspiration risk    Diet Recommendation NPO        Other  Recommendations Oral Care Recommendations: Oral care QID   Follow up Recommendations Inpatient Rehab      Frequency and Duration min 2x/week          Prognosis        Swallow Study   General HPI: Justin Mason is a 40 y.o. male with a history of hypertension, hyperlipidemia, diabetes, morbid obesity who presents with difficulty speaking. CT angiogram which revealed significant intracranial atherosclerosis with an occluded left M1.  CT perfusion was suggestive of a large area of hypoperfusion without core infarct on the left. CT small foci of hypodensity within the left caudate and left thalamus, may represent small infarcts, age indeterminate. Per MD note, at the time of angiogram it was discovered that the blockage was likely chronic and therefore no attempt at reopening it was performed. Type of Study: Bedside Swallow Evaluation Previous Swallow Assessment:  (none) Diet Prior to this Study: NPO Temperature Spikes Noted: No Respiratory Status: Nasal cannula History of Recent Intubation: No Behavior/Cognition: Alert;Cooperative;Pleasant mood;Requires cueing Oral Cavity  Assessment: Dry Oral Care Completed by SLP: Yes Oral Cavity - Dentition: Adequate natural dentition Vision:  (right inattention) Self-Feeding Abilities: Needs assist Patient Positioning: Upright in bed Baseline Vocal Quality: Other (comment) (UTA) Volitional Cough:   (UTA) Volitional Swallow: Unable to elicit    Oral/Motor/Sensory Function Overall Oral Motor/Sensory Function: Moderate impairment Facial ROM: Reduced right;Suspected CN VII (facial) dysfunction Facial Symmetry: Abnormal symmetry right;Suspected CN VII (facial) dysfunction Facial Strength: Reduced right;Suspected CN VII (facial) dysfunction Lingual ROM: Reduced right;Reduced left;Suspected CN XII (hypoglossal) dysfunction   Ice Chips     Thin Liquid Thin Liquid: Impaired Presentation: Cup;Straw Oral Phase Impairments: Reduced labial seal Oral Phase Functional Implications: Right anterior spillage Pharyngeal  Phase Impairments: Cough - Delayed    Nectar Thick Nectar Thick Liquid: Not tested   Honey Thick Honey Thick Liquid: Not tested   Puree Puree: Within functional limits   Solid     Solid: Not tested      [name] 07/25/2020,4:13 PM   Breck Coons Lonell Face.Ed Nurse, children's [phone] Office [phone]

## 2020-07-25 NOTE — Progress Notes (Addendum)
STROKE TEAM PROGRESS NOTE  HPI per record: Justin Mason is a 40 y.o. male with a history of hypertension, hyperlipidemia, diabetes, morbid obesity who presents with difficulty speaking that started around 1030 this morning.  His father states that he started having difficulty with speech, and then around 1230 stopped speaking altogether.  He was brought in to the hospital where it was noted that he was aphasic and a code stroke was activated.  He was taken for an emergent CT angiogram which revealed significant intracranial atherosclerosis with an occluded left M1.  CT perfusion was suggestive of a large area of hypoperfusion without core infarct on the left and therefore he was taken for angiogram with consideration of treatment.  Unfortunately, at the time of angiogram it was discovered that the blockage was likely chronic and therefore no attempt at reopening it was performed. LKW: 10:30 AM tpa given?: No, outside of window IR Thrombectomy?  Yes, attempted Modified Rankin Scale: 0-Completely asymptomatic and back to baseline post- stroke  INTERVAL HISTORY  Patient evaluated at bedside this morning, no family in the room.  Patient is alert, awake and globally aphasic but follows some midline commands.  Able to move left extremities against gravity but right dense hemiplegia.  Patient is COVID-positive, was intubated but extubated following attempted unsuccessful IR procedure.  To 4 L [place] without any complications, dyspnea or stridor.  For now he is on aspirin rectally, once able to pass swallow test we will start DAPT for 3 months with aspirin and Plavix and then aspirin alone.  Blood pressure well controlled.  See below for details of neurological exam.  Vitals:   07/25/20 1000 07/25/20 1100 07/25/20 1200 07/25/20 1300  BP: (!) 150/89 (!) 149/87 (!) 149/91 (!) 157/90  Pulse: 79 75 74 76  Resp: 18 18 16 18   Temp:   98.6 F (37 C)   TempSrc:   Oral   SpO2: 96% 100% 99% 100%  Weight:        CBC:  Recent Labs  Lab 07/24/20 1546 07/24/20 1554 07/25/20 0515  WBC 6.4  --  4.2  NEUTROABS 3.1  --  2.9  HGB 18.0* 17.3* 15.1  HCT 51.0 51.0 43.1  MCV 87.5  --  87.2  PLT 201  --  173   Basic Metabolic Panel:  Recent Labs  Lab 07/24/20 1546 07/24/20 1554 07/25/20 0515  NA 141 142 139  K 3.6 3.7 4.1  CL 102 103 103  CO2 24  --  23  GLUCOSE 164* 158* 165*  BUN 18 22* 17  CREATININE 1.10 1.00 1.05  CALCIUM 10.0  --  9.0   Lipid Panel:  Recent Labs  Lab 07/25/20 0515  CHOL 93  TRIG 190*  HDL 19*  CHOLHDL 4.9  VLDL 38  LDLCALC 36   HgbA1c:  Recent Labs  Lab 07/25/20 0515  HGBA1C 6.2*   Urine Drug Screen: Pending  IMAGING past 24 hours  CT Head code stroke wo contrast 07/24/2020  IMPRESSION: 1. Small foci of hypodensity within the left caudate and left thalamus, may represent small infarcts, age indeterminate. 2. Aspect score is 9.  CT Angio Head w or wo contrast CT cerebral perfusion w contrast 07/24/2020  IMPRESSION: 1. Short segment occlusion of the proximal right M1/MCA with decrease caliber and opacification of the corresponding vascular tree. 2. Occlusion of the left ICA terminus and proximal left M1 segment with decrease caliber and contrast opacification of the corresponding vascular tree. 3. Luminal irregularity  of the bilateral ACA and PCA vascular trees with moderate stenosis at the right P2/PCA segment. 4. Diffuse intracranial vasculopathy may be related to severe atherosclerosis versus vasculitis. 5. Scattered opacities within the bilateral upper lobes. Correlation with dedicated chest study suggested.  Chest x-ray 07/24/2020  IMPRESSION: 1. Endotracheal tube tip about 2.4 cm superior to the carina. 2. Low lung volumes results in central bronchovascular crowding. Patchy airspace disease at the right suprahilar lung, and left greater than right base either reflecting atelectasis versus multifocal infection  CT  angiogram 07/24/2020  Findings. 1.Severe preocclusive stenosis of RT MCA M 1 seg. 2.Severelt severe stenosis of LT ICA supraclinoid segment with flow in the Lt ACA. Occluded LT MCA Proximally with retrograde filling to distal M1 from prominent Lt PCA collaterals. Unsuccessful attempt at angioplasty of MCA due to  sig resistance to advancement of  microcatheter through the occlusion.. 32F angioseal for hemostasis in the Rt groin.. Distal pulses all dopplerable.  Echocardiogram 07/25/2020  Pending    PHYSICAL EXAM  General:   morbidly obese middle-aged Caucasian male lying comfortably in bed, NAD Eyes: No scleral injection HENT: No OP obstrucion Head: Normocephalic.  Cardiovascular: Normal rate and regular rhythm.  2/6 systolic murmur Respiratory: On 2 L/min [place] GI: Soft.  No distension. There is no tenderness.  Neurological: General: NAD Mental Status: Alert, awake, globally aphasic but able to follow simple midline commands.  Mute and unable to speak any words. Cranial Nerves: II:   pupils equal, round, reactive to light and accommodation, blinks to left side threat, not on right side III,IV, VI: ptosis not present, extra-ocular motions intact bilaterally.  Left gaze preference but able to look all the way to the right to command. V,VII: smile asymmetric, right facial weakness  VIII: hearing normal  XII: midline tongue extension without atrophy or fasciculations  Motor: Able to move left upper and lower extremities against gravity, right dense hemiplegia with 0/5 strength with flaccidity and hypotonia Sensory: Retracts on pinching on left extremities but no reaction on right extremities  Deep Tendon Reflexes:  Right: Upper Extremity   Left: Upper extremity   biceps (C-5 to C-6) 2/4   biceps (C-5 to C-6) 2/4 tricep (C7) 2/4    triceps (C7) 2/4 Brachioradialis (C6) 2/4  Brachioradialis (C6) 2/4  Lower Extremity Lower Extremity  quadriceps (L-2 to L-4) 2/4   quadriceps (L-2 to  L-4) 2/4 Achilles (S1) 2/4   Achilles (S1) 2/4  Plantars: Right: downgoing   Left: downgoing Cerebellar: normal finger-to-nose,  normal heel-to-shin test Gait: Deferred   ASSESSMENT/PLAN  Mr. Justin Mason is a 40 y.o. male with PMHx of hypertension, hyperlipidemia, diabetes, morbid obesity who presents with difficulty speaking.  CT head shows small focus of hypodensity within left caudate and left thalamus.  Emergent CT angiogram which revealed significant intracranial atherosclerosis with an occluded left M1.  CT perfusion was suggestive of large area of hypoperfusion without core infarct on left and was taken for angiogram but unfortunately at the time of angiogram it was discovered that the blockage was likely chronic and therefore no attempt at reopening of it was performed.  tPA not given as outside of window.  Stroke, likely due to left middle cerebral artery occlusion, s/p attempted unsuccessful mechanical thrombectomy and angioplasty stenting suspected intracranial stenosis is chronic as there is bilateral and there is evidence of distal collateral beyond left MCA occlusion   Code Stroke CT head : Small foci of hypodensity within the left caudate and left thalamus,  may represent small infarcts, age indeterminate. Aspect score is 9.  CTA head:a).  Anterior circulation: Short segment occlusion of the proximal right M1/MCA with decrease caliber and opacification of the corresponding vascular tree. Occlusion of the left ICA terminus and proximal left M1 segment with decrease caliber and contrast opacification of the corresponding vascular tree. b). Posterior circulation: Luminal irregularity the of the bilateral posterior cerebral arteries with moderate stenosis at the right P2 segment.  CT perfusion : a). Short segment occlusion of the proximal right M1/MCA with decrease caliber and opacification of the corresponding vascular tree. b). Occlusion of the left ICA terminus and proximal left  M1 segment with decrease caliber and contrast opacification of the corresponding vascular tree.   MRI : Pending  MRA : Pending  2D Echo: Pending  LDL 36  HgbA1c 6.2  VTE prophylaxis -subQ heparin    Diet   Diet NPO time specified    No antithrombotic prior to admission, now on aspirin 300 mg suppository daily.  Plan is to do DAPT with aspirin 81 mg and Plavix 75 mg for 3 months and then continue aspirin.  Therapy recommendations: Pending  Disposition: Pending  Hypertension  Home meds: Patient is metoprolol 100 mg, amlodipine 10 mg, clonidine 8.1 mg twice daily, olmesartan-hydrochlorothiazide.  Stable . Permissive hypertension (OK if < 220/120) but gradually normalize in 5-7 days . Long-term BP goal normotensive  Hyperlipidemia  Home meds: Atorvastatin 10 mg  Crestor 10 mg  LDL 36, goal < 70  Continue statin at discharge  Diabetes type II Uncontrolled  Home meds: Dapagliflozin 10 mg  HgbA1c 6.2, goal < 7.0  CBGs Recent Labs    07/25/20 0442 07/25/20 0831 07/25/20 1124  GLUCAP 142* 151* 147*      SSI   COVID-19 infection  Tested positive on 07/24/2020  Lactic acid elevated to 2.9  On 2 L/min [place]  Morbid obesity  BMI 50.20  Patient will be advised to lose weight and exercise.   Other Stroke Risk Factors   Not Cigarette smoker, but uses snuff , will be advised to stop smoking  ETOH use, alcohol level : Uses 7 standard drinks of alcohol per week, will be advised to stop alcohol use.  Substance abuse - UDS: Pending.  Patient advised to stop using due to stroke risk.  ? Obstructive sleep apnea   Other Active Problems  GERD  Arthritis  Hospital day # 1  I have personally obtained history,examined this patient, reviewed notes, independently viewed imaging studies, participated in medical decision making and plan of care.ROS completed by me personally and pertinent positives fully documented  I have made any additions or  clarifications directly to the above note. Agree with note above.  Presented with aphasia and right hemiplegia due to left M1 occlusion suspected acute on chronic as well as evidence of collaterals beyond it.  Unfortunately attempts at mechanical thrombectomy and angioplasty stenting were not successful.  Recommend close neurological monitoring and strict blood pressure control as per postop protocol.  Patient is also COVID-positive which could have contributed to his stroke as well.  Continue COVID precautions as per critical care team check MRI scan of the brain later today.  Speech therapy for swallow eval.  Check echocardiogram.  Aggressive risk factor modification.  Will need dual antiplatelet therapy for 3 months when he is able to swallow.  Long discussion with the patient and with father and spoke to over the phone and answered questions.  Discussed with Dr. Corliss Skains and  Amada JupiterKirkpatrick This patient is critically ill and at significant risk of neurological worsening, death and care requires constant monitoring of vital signs, hemodynamics,respiratory and cardiac monitoring, extensive review of multiple databases, frequent neurological assessment, discussion with family, other specialists and medical decision making of high complexity.I have made any additions or clarifications directly to the above note.This critical care time does not reflect procedure time, or teaching time or supervisory time of PA/NP/Med Resident etc but could involve care discussion time.  I spent 30 minutes of neurocritical care time  in the care of  this patient.       Delia HeadyPramod Lauria Depoy, MD Medical Director [facility] Pager: [phone] 07/25/2020 3:17 PM   To contact Stroke Continuity provider, please refer to [email or web address]. After hours, contact General Neurology

## 2020-07-25 NOTE — Progress Notes (Signed)
  Echocardiogram 2D Echocardiogram has been performed.  Justin Mason 07/25/2020, 3:13 PM

## 2020-07-25 NOTE — Progress Notes (Signed)
OT Cancellation Note  Patient Details Name: Justin Mason MRN: [id number] DOB: Jan 26, 1981   Cancelled Treatment:    Reason Eval/Treat Not Completed: Patient at procedure or test/ unavailable. Getting ready to see patient and transport came to get pt for MRI.  Ignacia Palma, OTR/L Acute Rehab Services Pager [phone] Office [phone]     Evette Georges 07/25/2020, 3:22 PM

## 2020-07-25 NOTE — Progress Notes (Signed)
Pt MRI was stopped because a metal artifact in his forehead was seen on first few images. 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 I did not see the piece of metal on CT until I looked back at it after seeing the artifact on MRI. This piece of metal will need to be removed before he can be scanned.

## 2020-07-25 NOTE — Progress Notes (Signed)
Two bags of patient's belongings given to family in ED lobby.

## 2020-07-25 NOTE — Procedures (Signed)
Extubation Procedure Note  Patient Details:   Name: Justin Mason DOB: 18-Nov-1980 MRN: [id number]   Airway Documentation:    Vent end date: 07/25/20 Vent end time: 0920   Evaluation  O2 sats: stable throughout Complications: No apparent complications Patient did tolerate procedure well. Bilateral Breath Sounds: Diminished   Yes   Patient was extubated to a 4L [place] without any complications, dyspnea or stridor noted. Positive cuff leak prior to extubation.   Tarryn Bogdan, Margaretmary Dys 07/25/2020, 9:20 AM

## 2020-07-25 NOTE — Plan of Care (Signed)
  Problem: Activity: Goal: Ability to tolerate increased activity will improve Outcome: Progressing   Problem: Respiratory: Goal: Ability to maintain a clear airway and adequate ventilation will improve Outcome: Completed/Met   Problem: Role Relationship: Goal: Method of communication will improve Outcome: Progressing   

## 2020-07-25 NOTE — Progress Notes (Signed)
eLink Physician-Brief Progress Note Patient Name: Justin Mason DOB: October 29, 1980 MRN: [id number]   Date of Service  07/25/2020  HPI/Events of Note  Morbidly obese 40 year old male with positive Covid who presented to [facility] with focal neurological findings, confirmed by imaging to represent large vessel occlusion with CVA, he went to IR for revascularization earlier this morning but the procedure was unsuccessful, he is being admitted to 4N ICU with CVA and post-procedure  Acute respiratory failure.  eICU Interventions  New Patient Evaluation completed.        Thomasene Lot Tally Mckinnon 07/25/2020, 3:17 AM

## 2020-07-25 NOTE — Progress Notes (Signed)
PT Cancellation Note  Patient Details Name: ANDERS HOHMANN MRN: [id number] DOB: 1981/06/18   Cancelled Treatment:    Reason Eval/Treat Not Completed: Medical issues which prohibited therapy - awaiting extubation, PT to check back this pm.   Marye Round, PT Acute Rehabilitation Services Pager [phone]  Office [phone]    Truddie Coco 07/25/2020, 8:26 AM

## 2020-07-25 NOTE — Evaluation (Signed)
Speech Language Pathology Evaluation Patient Details Name: Justin Mason MRN: [id number] DOB: [birth year] Today's Date: 07/25/2020 Time: 1425-     Problem List:  Patient Active Problem List   Diagnosis Date Noted  . 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  . 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 Morbid obesity (HCC) 05/24/2020  . Cardiac murmur 05/24/2020  . Arthritis   . High cholesterol   . Hyperlipemia   . Type 2 diabetes mellitus with hyperglycemia (HCC)   . Cellulitis 01/03/2015  . Cellulitis of right lower extremity 01/03/2015  . Sepsis (HCC) 01/03/2015  . Hypertension   . GERD (gastroesophageal reflux disease)   . Essential hypertension   . Cellulitis of right leg   . Rotator cuff tear, right 09/08/2011   Past Medical History:  Past Medical History:  Diagnosis Date  . Arthritis   . Cardiac murmur 05/24/2020  . Cellulitis 01/03/2015  . Cellulitis of right leg   . Cellulitis of right lower extremity 01/03/2015  . Essential hypertension   . GERD (gastroesophageal reflux disease)   . High cholesterol   . Hyperlipemia   . Hypertension   . Morbid obesity (HCC) 05/24/2020  . Rotator cuff tear, right 09/08/2011  . Sepsis (HCC) 01/03/2015  . Type 2 diabetes mellitus with hyperglycemia [facility])    Past Surgical History:  Past Surgical History:  Procedure Laterality Date  . INCISION AND DRAINAGE FOOT  1991-1992   STEPPED ON NAIL / INFECTED WOUND  . RADIOLOGY WITH ANESTHESIA N/A 07/24/2020   Procedure: IR WITH ANESTHESIA - CODE STROKE;  Surgeon: Radiologist, Medication, MD;  Location: [facility] OR;  Service: Radiology;  Laterality: N/A;  . SHOULDER SURGERY     September 08, 2011   HPI:  Justin Mason is a 40 y.o. male with a history of hypertension, hyperlipidemia, diabetes, morbid obesity who presents with difficulty speaking. CT angiogram which revealed significant intracranial atherosclerosis with an occluded left M1.  CT perfusion was suggestive of a large area of  hypoperfusion without core infarct on the left. CT small foci of hypodensity within the left caudate and left thalamus, may represent small infarcts, age indeterminate. Per MD note, at the time of angiogram it was discovered that the blockage was likely chronic and therefore no attempt at reopening it was performed.   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 There was one spontaneous phonation during assessment otherwise he was unable to produce volitional speech on command or during familiar songs or counting. Difficulty imitating labial movements for specific phonemes. He was able to follow commands to protrude tongue and smile but not lateralization from audio input only. Limb apraxia noted during one step commands which he was approximately 50% accurate. Biographical yes/no responses were 60%. He did become appropriately labile during evaluation. CN VII impairments resulting in moderate facial weakness. Pt would benefit from ST for basic communication of needs in acute care and post acute as he progresses.    SLP Assessment  SLP Recommendation/Assessment: Patient needs continued Speech Lanaguage Pathology Services SLP Visit Diagnosis: Aphasia (R47.01);Apraxia (R48.2)    Follow Up Recommendations  Inpatient Rehab    Frequency and Duration min 2x/week  2 weeks      SLP Evaluation Cognition  Overall Cognitive Status: Impaired/Different from baseline Arousal/Alertness: Awake/alert Orientation Level: Oriented to person;Oriented to place (via yes/no question) Attention: Sustained Sustained Attention: Impaired Memory:  (will asssess as able) Awareness: Impaired Awareness Impairment: Emergent impairment Problem Solving:  Impaired Behaviors: Lability Safety/Judgment: Impaired       Comprehension  Auditory Comprehension Overall Auditory Comprehension: Impaired Yes/No Questions: Impaired Basic Biographical  Questions: 51-75% accurate Commands: Impaired One Step Basic Commands: 25-49% accurate Interfering Components: Attention;Processing speed Visual Recognition/Discrimination Discrimination: Not tested Reading Comprehension Reading Status:  (TBA)    Expression Expression Primary Mode of Expression: Nonverbal - gestures Verbal Expression Overall Verbal Expression: Impaired Initiation: Impaired Automatic Speech:  (unable for singing or counting) Level of Generative/Spontaneous Verbalization:  (non verbal) Repetition: Impaired Level of Impairment:  (phoneme) Naming: Impairment Confrontation: Impaired Convergent: 0-24% accurate (0%) Pragmatics: Impairment Impairments: Eye contact Written Expression Written Expression:  (TBA)   Oral / Motor  Oral Motor/Sensory Function Overall Oral Motor/Sensory Function: Moderate impairment Facial ROM: Reduced right;Suspected CN VII (facial) dysfunction Facial Symmetry: Abnormal symmetry right;Suspected CN VII (facial) dysfunction Facial Strength: Reduced right;Suspected CN VII (facial) dysfunction Lingual ROM: Reduced right;Reduced left;Suspected CN XII (hypoglossal) dysfunction Motor Speech Overall Motor Speech: Impaired Phonation: Other (comment) (unable to imitate) Motor Planning: Impaired (oral motor planning deficits)   GO                    [name] 07/25/2020, 3:55 PM  Breck Coons Lonell Face.Ed Nurse, children's [phone] Office [phone]

## 2020-07-25 NOTE — Anesthesia Postprocedure Evaluation (Signed)
Anesthesia Post Note  Patient: Justin Mason  Procedure(s) Performed: IR WITH ANESTHESIA - CODE STROKE (N/A )     Patient location during evaluation: SICU Anesthesia Type: General Level of consciousness: sedated Pain management: pain level controlled Vital Signs Assessment: post-procedure vital signs reviewed and stable Respiratory status: patient remains intubated per anesthesia plan Cardiovascular status: stable Postop Assessment: no apparent nausea or vomiting Anesthetic complications: no   No complications documented.  Last Vitals:  Vitals:   07/25/20 0042 07/25/20 0047  BP: 126/75 125/71  Pulse: 61 61  Resp: 15 15  Temp:    SpO2: 99% 99%    Last Pain:  Vitals:   07/24/20 1522  TempSrc: Oral                 Earl Lites P Cindy Brindisi

## 2020-07-25 NOTE — Progress Notes (Signed)
Pt transported to and from MRI without event. °

## 2020-07-25 NOTE — Progress Notes (Signed)
eLink Physician-Brief Progress Note Patient Name: Justin Mason DOB: Aug 23, 1980 MRN: [id number]   Date of Service  07/25/2020  HPI/Events of Note  Patient needs soft left wrist restraint to prevent self-extubation.  eICU Interventions  Soft left wrist restraint ordered.        Thomasene Lot Ogan 07/25/2020, 4:50 AM

## 2020-07-25 NOTE — Progress Notes (Signed)
Patient transported to 4N without any complications.

## 2020-07-25 NOTE — Progress Notes (Signed)
Assisted tele visit to patient with family member.  Lisanne Ponce R Jacey Eckerson, RN   

## 2020-07-26 DIAGNOSIS — I6602 Occlusion and stenosis of left middle cerebral artery: Secondary | ICD-10-CM | POA: Diagnosis not present

## 2020-07-26 DIAGNOSIS — U071 COVID-19: Secondary | ICD-10-CM

## 2020-07-26 LAB — GLUCOSE, CAPILLARY
Glucose-Capillary: 108 mg/dL — ABNORMAL HIGH (ref 70–99)
Glucose-Capillary: 116 mg/dL — ABNORMAL HIGH (ref 70–99)
Glucose-Capillary: 137 mg/dL — ABNORMAL HIGH (ref 70–99)
Glucose-Capillary: 138 mg/dL — ABNORMAL HIGH (ref 70–99)
Glucose-Capillary: 141 mg/dL — ABNORMAL HIGH (ref 70–99)
Glucose-Capillary: 143 mg/dL — ABNORMAL HIGH (ref 70–99)
Glucose-Capillary: 157 mg/dL — ABNORMAL HIGH (ref 70–99)

## 2020-07-26 MED ORDER — METFORMIN HCL 500 MG PO TABS: 1000.0000 mg | ORAL_TABLET | Freq: Two times a day (BID) | ORAL | Status: DC

## 2020-07-26 MED ORDER — PANTOPRAZOLE SODIUM 40 MG PO TBEC: 40.0000 mg | DELAYED_RELEASE_TABLET | Freq: Every day | ORAL | Status: DC

## 2020-07-26 MED ORDER — LABETALOL HCL 5 MG/ML IV SOLN
10.0000 mg | INTRAVENOUS | Status: DC | PRN
  Filled 2020-07-26: qty 4

## 2020-07-26 MED ORDER — DAPAGLIFLOZIN PROPANEDIOL 10 MG PO TABS: 10.0000 mg | ORAL_TABLET | Freq: Every day | ORAL | Status: DC

## 2020-07-26 MED ORDER — MELOXICAM 7.5 MG PO TABS: 15.0000 mg | ORAL_TABLET | Freq: Every day | ORAL | Status: DC

## 2020-07-26 MED ORDER — MELOXICAM 7.5 MG PO TABS
15.0000 mg | ORAL_TABLET | Freq: Every day | ORAL | Status: DC
  Administered 2020-07-26 – 2020-08-09 (×15): 15 mg via ORAL
  Filled 2020-07-26 (×15): qty 2

## 2020-07-26 MED ORDER — METOPROLOL SUCCINATE ER 50 MG PO TB24: 100.0000 mg | ORAL_TABLET | Freq: Two times a day (BID) | ORAL | Status: DC

## 2020-07-26 MED ORDER — LABETALOL HCL 5 MG/ML IV SOLN: 10.0000 mg | INTRAVENOUS | Status: DC | PRN

## 2020-07-26 MED ORDER — METFORMIN HCL 500 MG PO TABS
1000.0000 mg | ORAL_TABLET | Freq: Two times a day (BID) | ORAL | Status: DC
  Administered 2020-07-26 – 2020-08-09 (×28): 1000 mg via ORAL
  Filled 2020-07-26 (×28): qty 2

## 2020-07-26 MED ORDER — ADULT MULTIVITAMIN W/MINERALS CH
1.0000 | ORAL_TABLET | Freq: Every day | ORAL | Status: DC
  Administered 2020-07-26 – 2020-08-09 (×15): 1 via ORAL
  Filled 2020-07-26 (×15): qty 1

## 2020-07-26 MED ORDER — IRBESARTAN 300 MG PO TABS
300.0000 mg | ORAL_TABLET | Freq: Every day | ORAL | Status: DC
  Administered 2020-07-26 – 2020-08-09 (×15): 300 mg via ORAL
  Filled 2020-07-26 (×10): qty 1
  Filled 2020-07-26: qty 2
  Filled 2020-07-26 (×4): qty 1

## 2020-07-26 MED ORDER — CLONIDINE HCL 0.1 MG PO TABS: 0.1000 mg | ORAL_TABLET | Freq: Two times a day (BID) | ORAL | Status: DC

## 2020-07-26 MED ORDER — ALLOPURINOL 100 MG PO TABS
100.0000 mg | ORAL_TABLET | Freq: Every day | ORAL | Status: DC | PRN
  Filled 2020-07-26: qty 1

## 2020-07-26 MED ORDER — HYDROCHLOROTHIAZIDE 25 MG PO TABS
25.0000 mg | ORAL_TABLET | Freq: Every day | ORAL | Status: DC
  Administered 2020-07-26 – 2020-08-09 (×15): 25 mg via ORAL
  Filled 2020-07-26 (×15): qty 1

## 2020-07-26 MED ORDER — ASCORBIC ACID 500 MG PO TABS: 500.0000 mg | ORAL_TABLET | Freq: Every day | ORAL | Status: DC

## 2020-07-26 MED ORDER — OLMESARTAN MEDOXOMIL-HCTZ 40-25 MG PO TABS: 1.0000 | ORAL_TABLET | Freq: Every day | ORAL | Status: DC

## 2020-07-26 MED ORDER — ASCORBIC ACID 500 MG PO TABS
500.0000 mg | ORAL_TABLET | Freq: Every day | ORAL | Status: DC
  Administered 2020-07-26 – 2020-08-09 (×15): 500 mg via ORAL
  Filled 2020-07-26 (×16): qty 1

## 2020-07-26 MED ORDER — LABETALOL HCL 5 MG/ML IV SOLN: 5.0000 mg | INTRAVENOUS | Status: DC | PRN

## 2020-07-26 MED ORDER — ENSURE ENLIVE PO LIQD
237.0000 mL | Freq: Two times a day (BID) | ORAL | Status: DC
  Administered 2020-07-26 – 2020-08-09 (×28): 237 mL via ORAL

## 2020-07-26 MED ORDER — METOPROLOL TARTRATE 50 MG PO TABS
100.0000 mg | ORAL_TABLET | Freq: Two times a day (BID) | ORAL | Status: DC
  Administered 2020-07-26 – 2020-08-09 (×29): 100 mg via ORAL
  Filled 2020-07-26 (×9): qty 2
  Filled 2020-07-26: qty 4
  Filled 2020-07-26 (×19): qty 2

## 2020-07-26 MED ORDER — CLONIDINE HCL 0.1 MG PO TABS
0.1000 mg | ORAL_TABLET | Freq: Two times a day (BID) | ORAL | Status: DC
  Administered 2020-07-26 – 2020-07-27 (×3): 0.1 mg via ORAL
  Filled 2020-07-26 (×3): qty 1

## 2020-07-26 MED ORDER — PANTOPRAZOLE SODIUM 40 MG PO PACK
40.0000 mg | PACK | Freq: Every day | ORAL | Status: DC
  Administered 2020-07-26 – 2020-08-09 (×15): 40 mg via ORAL
  Filled 2020-07-26 (×15): qty 20

## 2020-07-26 MED ORDER — ZINC SULFATE 220 (50 ZN) MG PO CAPS
200.0000 mg | ORAL_CAPSULE | Freq: Every evening | ORAL | Status: DC
  Administered 2020-07-26 – 2020-08-08 (×14): 220 mg via ORAL
  Filled 2020-07-26 (×14): qty 1

## 2020-07-26 MED ORDER — VITAMIN D 25 MCG (1000 UNIT) PO TABS
1000.0000 [IU] | ORAL_TABLET | Freq: Every evening | ORAL | Status: DC
  Administered 2020-07-26 – 2020-08-08 (×14): 1000 [IU] via ORAL
  Filled 2020-07-26 (×15): qty 1

## 2020-07-26 NOTE — Progress Notes (Addendum)
STROKE TEAM PROGRESS NOTE  INTERVAL HISTORY No acute overnight events.  Patient evaluated at bedside this morning, no family present in the room.  Patient is alert, awake and globally aphasic but follow midline commands.  More interactive than yesterday.  Able to move left extremities against gravity but no movement in right upper extremity.  Although, able to kick out right lower extremity today.  He was not able to pass his swallow test yesterday so SLP is planning to do FEES (fiberoptic endoscopic evaluation of swallow).  If he is not able to pass that, plan is to do core track today.  Discussed with Dr. Charolotte Eke and hospitalist would be taking over the care for this patient.  Blood pressure well controlled.  Neurological exam mildly improving.  Vitals:   07/26/20 0400 07/26/20 0500 07/26/20 0700 07/26/20 0800  BP: (!) 157/83 (!) 167/99 (!) 169/98 (!) 173/93  Pulse: 67 72 64 65  Resp: 18 18 18 16   Temp:    98.6 F (37 C)  TempSrc:    Oral  SpO2: 98% 99% 98% 98%  Weight:       CBC:  Recent Labs  Lab 07/24/20 1546 07/24/20 1554 07/25/20 0515  WBC 6.4  --  4.2  NEUTROABS 3.1  --  2.9  HGB 18.0* 17.3* 15.1  HCT 51.0 51.0 43.1  MCV 87.5  --  87.2  PLT 201  --  173   Basic Metabolic Panel:  Recent Labs  Lab 07/24/20 1546 07/24/20 1554 07/25/20 0515  NA 141 142 139  K 3.6 3.7 4.1  CL 102 103 103  CO2 24  --  23  GLUCOSE 164* 158* 165*  BUN 18 22* 17  CREATININE 1.10 1.00 1.05  CALCIUM 10.0  --  9.0   Lipid Panel:  Recent Labs  Lab 07/25/20 0515  CHOL 93  TRIG 190*  HDL 19*  CHOLHDL 4.9  VLDL 38  LDLCALC 36   HgbA1c:  Recent Labs  Lab 07/25/20 0515  HGBA1C 6.2*   Urine Drug Screen:Negative  IMAGING past 24 hours  MR Brain MR Angio Head wo contrast 07/25/2020  IMPRESSION: Acute left MCA territory infarct. Occlusions involving the left ICA terminus and left M1 segment with diminished opacification of distal left MCA branches.  Short-segment right M1  occlusion is unchanged.  Left A1 segment proximal occlusion and vessel wall irregularity, unchanged.  Smaller 2-3 mm acute right parietal infarct versus artifact. No intracranial hemorrhage.   CT Head code stroke wo contrast 07/24/2020  IMPRESSION: 1. Small foci of hypodensity within the left caudate and left thalamus, may represent small infarcts, age indeterminate. 2. Aspect score is 9.  CT Angio Head w or wo contrast CT cerebral perfusion w contrast 07/24/2020  IMPRESSION: 1. Short segment occlusion of the proximal right M1/MCA with decrease caliber and opacification of the corresponding vascular tree. 2. Occlusion of the left ICA terminus and proximal left M1 segment with decrease caliber and contrast opacification of the corresponding vascular tree. 3. Luminal irregularity of the bilateral ACA and PCA vascular trees with moderate stenosis at the right P2/PCA segment. 4. Diffuse intracranial vasculopathy may be related to severe atherosclerosis versus vasculitis. 5. Scattered opacities within the bilateral upper lobes. Correlation with dedicated chest study suggested.  Chest x-ray 07/24/2020  IMPRESSION: 1. Endotracheal tube tip about 2.4 cm superior to the carina. 2. Low lung volumes results in central bronchovascular crowding. Patchy airspace disease at the right suprahilar lung, and left greater than right base either  reflecting atelectasis versus multifocal infection  CT angiogram 07/24/2020  Findings. 1.Severe preocclusive stenosis of RT MCA M 1 seg. 2.Severelt severe stenosis of LT ICA supraclinoid segment with flow in the Lt ACA. Occluded LT MCA Proximally with retrograde filling to distal M1 from prominent Lt PCA collaterals. Unsuccessful attempt at angioplasty of MCA due to  sig resistance to advancement of  microcatheter through the occlusion.. 69F angioseal for hemostasis in the Rt groin.. Distal pulses all  dopplerable.  Echocardiogram 07/25/2020 IMPRESSIONS    1. Left ventricular ejection fraction, by estimation, is 60 to 65%. The  left ventricle has normal function. The left ventricle has no regional  wall motion abnormalities. There is moderate concentric left ventricular  hypertrophy. Left ventricular  diastolic parameters are indeterminate.  2. Right ventricular systolic function is normal. The right ventricular  size is normal.  3. The mitral valve is normal in structure. No evidence of mitral valve  regurgitation. No evidence of mitral stenosis.  4. The aortic valve is tricuspid. Aortic valve regurgitation is not  visualized. No aortic stenosis is present.  5. The inferior vena cava is dilated in size with >50% respiratory  variability, suggesting right atrial pressure of 8 mmHg.      PHYSICAL EXAM  General:   morbidly obese middle-aged Caucasian male lying comfortably in bed, NAD Eyes: No scleral injection HENT: No OP obstrucion Head: Normocephalic.  Cardiovascular: Normal rate and regular rhythm.  2/6 systolic murmur Respiratory: On 2 L/min [place] GI: Soft.  No distension. There is no tenderness.  Neurological: General: NAD Mental Status: Alert, awake, globally aphasic but able to follow simple midline commands.  Mute and unable to speak any words. Cranial Nerves: II:   pupils equal, round, reactive to light and accommodation, blinks to left side threat, not on right side III,IV, VI: ptosis not present, extra-ocular motions intact bilaterally.  Left gaze preference but able to look all the way to the right to command. V,VII: smile asymmetric, right facial weakness  VIII: hearing normal  XII: midline tongue extension without atrophy or fasciculations  Motor: Able to move left upper and lower extremities against gravity, right dense hemiplegia with 0/5 strength on upper extremity, 1/5 on lower extremity with flaccidity and hypotonia Sensory: Retracts on pinching on  left extremities but no reaction on right extremities  Deep Tendon Reflexes:  Right: Upper Extremity   Left: Upper extremity   biceps (C-5 to C-6) 2/4   biceps (C-5 to C-6) 2/4 tricep (C7) 2/4    triceps (C7) 2/4 Brachioradialis (C6) 2/4  Brachioradialis (C6) 2/4  Lower Extremity Lower Extremity  quadriceps (L-2 to L-4) 2/4   quadriceps (L-2 to L-4) 2/4 Achilles (S1) 2/4   Achilles (S1) 2/4  Plantars: Right: downgoing   Left: downgoing Cerebellar: normal finger-to-nose,  normal heel-to-shin test Gait: Deferred   ASSESSMENT/PLAN  Mr. Justin Mason is a 40 y.o. male with PMHx of hypertension, hyperlipidemia, diabetes, morbid obesity who presents with difficulty speaking.  CT head shows small focus of hypodensity within left caudate and left thalamus.  Emergent CT angiogram which revealed significant intracranial atherosclerosis with an occluded left M1.  CT perfusion was suggestive of large area of hypoperfusion without core infarct on left and was taken for angiogram but unfortunately at the time of angiogram it was discovered that the blockage was likely chronic and therefore no attempt at reopening of it was performed.  tPA not given as outside of window.  Stroke, likely due to left middle cerebral  artery occlusion, s/p attempted unsuccessful mechanical thrombectomy and angioplasty stenting suspected intracranial stenosis is chronic as there is bilateral and there is evidence of distal collateral beyond left MCA occlusion   Code Stroke CT head : Small foci of hypodensity within the left caudate and left thalamus, may represent small infarcts, age indeterminate. Aspect score is 9.  CTA head:a).  Anterior circulation: Short segment occlusion of the proximal right M1/MCA with decrease caliber and opacification of the corresponding vascular tree. Occlusion of the left ICA terminus and proximal left M1 segment with decrease caliber and contrast opacification of the corresponding vascular  tree. b). Posterior circulation: Luminal irregularity the of the bilateral posterior cerebral arteries with moderate stenosis at the right P2 segment.  CT perfusion : a). Short segment occlusion of the proximal right M1/MCA with decrease caliber and opacification of the corresponding vascular tree. b). Occlusion of the left ICA terminus and proximal left M1 segment with decrease caliber and contrast opacification of the corresponding vascular tree.   MRI : Acute left MCA territory infarct. Occlusions involving the left ICA        terminus and left M1 segment with diminished opacification of distal          left MCA branches.  2D Echo: Left ventricular ejection fraction, by estimation, is 60 to 65%.  LDL 36  HgbA1c 6.2  VTE prophylaxis -subQ heparin    Diet   Diet NPO time specified    No antithrombotic prior to admission, now on aspirin 300 mg suppository daily.  Plan is to do DAPT with aspirin 81 mg and Plavix 75 mg for 3 months and then continue aspirin.  Therapy recommendations: Pending  Disposition: Pending  Hypertension  Home meds: Patient is metoprolol 100 mg, amlodipine 10 mg, clonidine 8.1 mg twice daily, olmesartan-hydrochlorothiazide.  Stable . Permissive hypertension (OK if < 220/120) but gradually normalize in 5-7 days . Long-term BP goal normotensive  Hyperlipidemia  Home meds: Atorvastatin 10 mg  Crestor 10 mg  LDL 36, goal < 70  Continue statin at discharge  Diabetes type II Uncontrolled  Home meds: Dapagliflozin 10 mg  HgbA1c 6.2, goal < 7.0  CBGs Recent Labs    07/26/20 0034 07/26/20 0500 07/26/20 0813  GLUCAP 141* 137* 143*      SSI   COVID-19 infection  Tested positive on 07/24/2020  Lactic acid elevated to 2.9  On 2 L/min [place]  Morbid obesity  BMI 50.20  Patient will be advised to lose weight and exercise.   Other Stroke Risk Factors   Not Cigarette smoker, but uses snuff , will be advised to stop smoking  ETOH use,  alcohol level : Uses 7 standard drinks of alcohol per week, will be advised to stop alcohol use.  Substance abuse - UDS: Pending.  Patient advised to stop using due to stroke risk.  ? Obstructive sleep apnea   Other Active Problems  GERD  Arthritis  Hospital day # 2  Patient remains aphasic with dense right hemiplegia but is able to follow some simple commands.  Speech therapy to do swallow evaluation today and if he fails he will need core track tube for nutrition purposes.  Continue close neurological monitoring and strict blood pressure control  .  Patient is also COVID-positive which could have contributed to his stroke as well.  Continue COVID precautions as per critical care team check MRI scan of the brain later today.  Continue rectal aspirin.  Patient is able to swallow and has  a feeding tube will need dual antiplatelet therapy for 3 months when he is able to swallow.  Long discussion with the patient and with father and spoke to over the phone and answered questions.  Discussed with Dr. Corliss Skainseveshwar and Dr. Denese KillingsAgarwala and answered questions this patient is critically ill and at significant risk of neurological worsening, death and care requires constant monitoring of vital signs, hemodynamics,respiratory and cardiac monitoring, extensive review of multiple databases, frequent neurological assessment, discussion with family, other specialists and medical decision making of high complexity.I have made any additions or clarifications directly to the above note.This critical care time does not reflect procedure time, or teaching time or supervisory time of PA/NP/Med Resident etc but could involve care discussion time.  I spent 30 minutes of neurocritical care time  in the care of  this patient.       Delia HeadyPramod Earl Zellmer, MD Medical Director [facility] Pager: [phone] 07/26/2020 11:35 AM   To contact Stroke Continuity provider, please refer to [email or web address]. After hours, contact  General Neurology

## 2020-07-26 NOTE — Consult Note (Signed)
NAME:  Justin Mason, MRN:  [id number], DOB:  [birth year], LOS: 2 ADMISSION DATE:  07/24/2020, CONSULTATION DATE:  1/19 REFERRING MD:  Corliss Skains , CHIEF COMPLAINT:  Mechanically ventilated -- L MCA occlusion  Brief History:  40 yo M with COVID who remains intubated following 4 vessel cerebral arteriogram with unsuccessful attempt of angioplasty of MCA   History of Present Illness:  40 yo M PMH morbid obesity, DM2, HLD, HTN, known COVID-19 infection (dx less than 1 week PTA) who presented to ED 1/19 with aphasia and RUE weakness beginning at about 1030 AM 07/24/2020 and progressing until 1230 same day with R facial droop. CT H with concern for L sided infarcts CTA with short segment occlusion of R M1/MCA, L ICA occlusion, proximal L M1, stenosis of PCAs. Outside of window for tpa. Went to [facility] for revascularization but angioplasty of MCA unsuccessful due to resistance.  Remains intubated after NIR  PCCM consulted in this setting  Past Medical History:  Morbid obesity HLD DM2 with hyperglycemia High cholesterol HTN HLD 2/6 systolic murmur  Significant Hospital Events:  1/19 NIR unsuccessful angioplasty of MCA  Consults:  PCCM  Procedures:  1/19 ETT  Significant Diagnostic Tests:  CTA: Proximal R M1/MCA opacification. L ICA terminus and proximal L M1 opacification, Bilateral ACA PCA stenosis.  MRI: Confirms acute MCA stroke  Micro Data:  COVID positive   Antimicrobials:  none  Interim History / Subjective:  Extubated yesterday. No respiratory signs. Tested positive for COVID last week but has been minimally symptomatic according to mother. 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 rate 16, weight (!) 172.6 kg, SpO2 98 %.        Intake/Output Summary (Last 24 hours) at 07/26/2020 8315 Last data filed at 07/26/2020 0800 Gross per 24 hour   Intake 1356.37 ml  Output 1250 ml  Net 106.37 ml   Filed Weights   07/24/20 1615 07/25/20 0604  Weight: (!) 172.8 kg (!) 172.6 kg    Examination: General: Adult male, on vent  HENT: ETT/OG in place  Lungs: clear breath sounds, vent assisted breaths  Cardiovascular: RRR, no MRG Abdomen: Distended, active bowel sounds Extremities: -edema  Neuro: sedated, opens eyes with physical stimulation, does not follow commands, withdrawals LUE, LLE, RLE, does not move RUE GU: intact   Resolved Hospital Problem list     Assessment & Plan:   Was critically ill due to respiratory failure  in post-operative setting  COVID-19 infection (reportedly positive one week ago), but no respiratory symptoms on room air. - presently extubated and ready for transfer to floor - no treatment needed for COVID without symptoms or O2 requirement.   L ICA occlusion, severe R MCA stenosis Unsuccessful revascularization due to chronicity Cause of decompensation of chronic disease unclear - possible hypoglycemic episode.  - Secondary stroke prevention: Crestor given low HDL. DM control excellent on appropriate agents.  - BP control may need optimization - Avoid meloxicam for pain control  Hypertension.  - IV prn until speech cleared. Permissive hypertension for first 48h.    Best practice (evaluated daily)  Diet: NPO FEES pending.  Pain/Anxiety/Delirium protocol (if indicated): APAP only  VAP protocol (if indicated)  DVT prophylaxis: SCD, heparin sq GI prophylaxis: PPI Glucose control: Trend glucose  Mobility: progressive ambulation.  Disposition: transfer to PCU, TRH notified and orders reconciled.   Goals  of Care:  Last date of multidisciplinary goals of care discussion: Family and staff present:  Summary of discussion:  Follow up goals of care discussion due:  Code Status: FC   Labs   CBC: Recent Labs  Lab 07/24/20 1546 07/24/20 1554 07/25/20 0515  WBC 6.4  --  4.2  NEUTROABS 3.1  --   2.9  HGB 18.0* 17.3* 15.1  HCT 51.0 51.0 43.1  MCV 87.5  --  87.2  PLT 201  --  173    Basic Metabolic Panel: Recent Labs  Lab 07/24/20 1546 07/24/20 1554 07/25/20 0515  NA 141 142 139  K 3.6 3.7 4.1  CL 102 103 103  CO2 24  --  23  GLUCOSE 164* 158* 165*  BUN 18 22* 17  CREATININE 1.10 1.00 1.05  CALCIUM 10.0  --  9.0   GFR: Estimated Creatinine Clearance: 156.3 mL/min (by C-G formula based on SCr of 1.05 mg/dL). Recent Labs  Lab 07/24/20 1546 07/24/20 1600 07/24/20 1604 07/25/20 0515  PROCALCITON  --  <0.10  --   --   WBC 6.4  --   --  4.2  LATICACIDVEN  --   --  2.9* 1.6    Liver Function Tests: Recent Labs  Lab 07/24/20 1546  AST 51*  ALT 84*  ALKPHOS 65  BILITOT 0.8  PROT 7.9  ALBUMIN 4.1   No results for input(s): LIPASE, AMYLASE in the last 168 hours. No results for input(s): AMMONIA in the last 168 hours.  ABG    Component Value Date/Time   PHART 7.372 07/24/2020 2355   PCO2ART 44.5 07/24/2020 2355   PO2ART 136 (H) 07/24/2020 2355   HCO3 25.6 07/24/2020 2355   TCO2 25 07/24/2020 1554   O2SAT 98.6 07/24/2020 2355     Coagulation Profile: Recent Labs  Lab 07/24/20 1546  INR 1.0    Cardiac Enzymes: No results for input(s): CKTOTAL, CKMB, CKMBINDEX, TROPONINI in the last 168 hours.  HbA1C: Hgb A1c MFr Bld  Date/Time Value Ref Range Status  07/25/2020 05:15 AM 6.2 (H) 4.8 - 5.6 % Final    Comment:    (NOTE) Pre diabetes:          5.7%-6.4%  Diabetes:              >6.4%  Glycemic control for   <7.0% adults with diabetes   01/03/2015 07:11 AM 5.4 4.8 - 5.6 % Final    Comment:    (NOTE)         Pre-diabetes: 5.7 - 6.4         Diabetes: >6.4         Glycemic control for adults with diabetes: <7.0     CBG: Recent Labs  Lab 07/25/20 1613 07/25/20 2058 07/26/20 0034 07/26/20 0500 07/26/20 0813  GLUCAP 140* 149* 141* 137* 143*   35 min spent with >50% in counseling and coordination of care.   Lynnell Catalan, MD  [facility] ICU Physician [facility]  Pager: [phone] Or Epic Secure Chat After hours: [phone].  07/26/2020, 9:43 AM

## 2020-07-26 NOTE — Progress Notes (Signed)
Spoke with the patient's mother. She stated that he started feeling feverish and not well on 07/14/20. He took a Covid PCR test and she stated that she was in the process of trying to get that from his employer so we could document it (unknown date of test). She stated most symptoms resolved around 07/16/20, but continued taking Nyquil for a cough until 07/21/20.  She also stated that his right index finger is weak at baseline.

## 2020-07-26 NOTE — Procedures (Signed)
Objective Swallowing Evaluation: Type of Study: FEES-Fiberoptic Endoscopic Evaluation of Swallow   Patient Details  Name: Justin Mason MRN: [id number] Date of Birth: [birth year]  Today's Date: 07/26/2020 Time: SLP Start Time (ACUTE ONLY): 1144 -SLP Stop Time (ACUTE ONLY): 1205  SLP Time Calculation (min) (ACUTE ONLY): 21 min   Past Medical History:  Past Medical History:  Diagnosis Date  . Arthritis   . Cardiac murmur 05/24/2020  . Cellulitis 01/03/2015  . Cellulitis of right leg   . Cellulitis of right lower extremity 01/03/2015  . Essential hypertension   . GERD (gastroesophageal reflux disease)   . High cholesterol   . Hyperlipemia   . Hypertension   . Morbid obesity (HCC) 05/24/2020  . Rotator cuff tear, right 09/08/2011  . Sepsis (HCC) 01/03/2015  . Type 2 diabetes mellitus with hyperglycemia [facility])    Past Surgical History:  Past Surgical History:  Procedure Laterality Date  . INCISION AND DRAINAGE FOOT  1991-1992   STEPPED ON NAIL / INFECTED WOUND  . IR ANGIO INTRA EXTRACRAN SEL INTERNAL CAROTID BILAT MOD SED  07/24/2020  . IR ANGIO VERTEBRAL SEL SUBCLAVIAN INNOMINATE BILAT MOD SED  07/24/2020  . RADIOLOGY WITH ANESTHESIA N/A 07/24/2020   Procedure: IR WITH ANESTHESIA - CODE STROKE;  Surgeon: Radiologist, Medication, MD;  Location: [facility] OR;  Service: Radiology;  Laterality: N/A;  . SHOULDER SURGERY     September 08, 2011   HPI: Justin Mason is a 40 y.o. male with a history of hypertension, hyperlipidemia, diabetes, morbid obesity who presents with difficulty speaking. CT angiogram which revealed significant intracranial atherosclerosis with an occluded left M1.  CT perfusion was suggestive of a large area of hypoperfusion without core infarct on the left. CT small foci of hypodensity within the left caudate and left thalamus, may represent small infarcts, age indeterminate. Per MD note, at the time of angiogram it was discovered that the blockage was likely chronic and therefore no  attempt at reopening it was performed.   No data recorded   Assessment / Plan / Recommendation  CHL IP CLINICAL IMPRESSIONS 07/26/2020  Clinical Impression Pt demonstrated a mild oral dysphagia marked by increased effort and time to manipulate and transit solid. Anatomical structures were normal without edema or secretions present. 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 Occasionally thin liquid traversed lateral channels prior to laryngeal closure indicative of possible mistimed inititiation of closure however no aspiration seen after the swallow and no green material at glottis or subglottis level. Swallow integrity was challenged with larger boluses and requested to take serial swallows however he self paced after large sip consumed. No significant pharyngeal residue post swallow. Given right facial weakness and decreased sensation, Dys 2 (fine chopped) texture recommended, thin liquids, crush pills, straws allowed, small sips and full supervision and assist.  SLP Visit Diagnosis Dysphagia, unspecified (R13.10)  Attention and concentration deficit following --  Frontal lobe and executive function deficit following --  Impact on safety and function Mild aspiration risk      CHL IP TREATMENT RECOMMENDATION 07/26/2020  Treatment Recommendations Therapy as outlined in treatment plan below     Prognosis 07/26/2020  Prognosis for Safe Diet Advancement Good  Barriers to Reach Goals Other (Comment)  Barriers/Prognosis Comment --    CHL IP DIET RECOMMENDATION 07/26/2020  SLP Diet Recommendations Dysphagia 3 (Mech soft) solids;Thin liquid  Liquid Administration via Cup;Straw  Medication Administration Crushed with puree  Compensations Slow rate;Minimize environmental distractions;Small sips/bites;Lingual sweep  for clearance of pocketing  Postural Changes Seated upright at 90 degrees      CHL IP OTHER RECOMMENDATIONS 07/26/2020  Recommended Consults --   Oral Care Recommendations Oral care BID  Other Recommendations --      CHL IP FOLLOW UP RECOMMENDATIONS 07/26/2020  Follow up Recommendations Inpatient Rehab      CHL IP FREQUENCY AND DURATION 07/26/2020  Speech Therapy Frequency (ACUTE ONLY) min 2x/week  Treatment Duration 2 weeks           CHL IP ORAL PHASE 07/26/2020  Oral Phase Impaired  Oral - Pudding Teaspoon --  Oral - Pudding Cup --  Oral - Honey Teaspoon --  Oral - Honey Cup --  Oral - Nectar Teaspoon --  Oral - Nectar Cup --  Oral - Nectar Straw --  Oral - Thin Teaspoon --  Oral - Thin Cup --  Oral - Thin Straw --  Oral - Puree --  Oral - Mech Soft --  Oral - Regular Weak lingual manipulation  Oral - Multi-Consistency --  Oral - Pill --  Oral Phase - Comment --    CHL IP PHARYNGEAL PHASE 07/26/2020  Pharyngeal Phase WFL  Pharyngeal- Pudding Teaspoon --  Pharyngeal --  Pharyngeal- Pudding Cup --  Pharyngeal --  Pharyngeal- Honey Teaspoon --  Pharyngeal --  Pharyngeal- Honey Cup --  Pharyngeal --  Pharyngeal- Nectar Teaspoon --  Pharyngeal --  Pharyngeal- Nectar Cup --  Pharyngeal --  Pharyngeal- Nectar Straw --  Pharyngeal --  Pharyngeal- Thin Teaspoon --  Pharyngeal --  Pharyngeal- Thin Cup --  Pharyngeal --  Pharyngeal- Thin Straw --  Pharyngeal --  Pharyngeal- Puree --  Pharyngeal --  Pharyngeal- Mechanical Soft --  Pharyngeal --  Pharyngeal- Regular --  Pharyngeal --  Pharyngeal- Multi-consistency --  Pharyngeal --  Pharyngeal- Pill --  Pharyngeal --  Pharyngeal Comment --     CHL IP CERVICAL ESOPHAGEAL PHASE 07/26/2020  Cervical Esophageal Phase WFL  Pudding Teaspoon --  Pudding Cup --  Honey Teaspoon --  Honey Cup --  Nectar Teaspoon --  Nectar Cup --  Nectar Straw --  Thin Teaspoon --  Thin Cup --  Thin Straw --  Puree --  Mechanical Soft --  Regular --  Multi-consistency --  Pill --  Cervical Esophageal Comment --     [name] 07/26/2020, 5:22 PM [name] M.Ed Nurse, children's [phone] Office [phone]

## 2020-07-26 NOTE — Plan of Care (Signed)
Problem: Education: Goal: Knowledge of General Education information will improve Description: Including pain rating scale, medication(s)/side effects and non-pharmacologic comfort measures 07/26/2020 1851 by Drue Dun, RN Outcome: Progressing 07/26/2020 1717 by Drue Dun, RN Outcome: Progressing   Problem: Health Behavior/Discharge Planning: Goal: Ability to manage health-related needs will improve 07/26/2020 1851 by Drue Dun, RN Outcome: Progressing 07/26/2020 1717 by Drue Dun, RN Outcome: Progressing   Problem: Clinical Measurements: Goal: Ability to maintain clinical measurements within normal limits will improve 07/26/2020 1851 by Drue Dun, RN Outcome: Progressing 07/26/2020 1717 by Drue Dun, RN Outcome: Progressing Goal: Will remain free from infection 07/26/2020 1851 by Drue Dun, RN Outcome: Progressing 07/26/2020 1717 by Drue Dun, RN Outcome: Progressing Goal: Diagnostic test results will improve 07/26/2020 1851 by Drue Dun, RN Outcome: Progressing 07/26/2020 1717 by Drue Dun, RN Outcome: Progressing Goal: Respiratory complications will improve 07/26/2020 1851 by Drue Dun, RN Outcome: Progressing 07/26/2020 1717 by Drue Dun, RN Outcome: Progressing Goal: Cardiovascular complication will be avoided 07/26/2020 1851 by Drue Dun, RN Outcome: Progressing 07/26/2020 1717 by Drue Dun, RN Outcome: Progressing   Problem: Activity: Goal: Risk for activity intolerance will decrease 07/26/2020 1851 by Drue Dun, RN Outcome: Progressing 07/26/2020 1717 by Drue Dun, RN Outcome: Progressing   Problem: Nutrition: Goal: Adequate nutrition will be maintained 07/26/2020 1851 by Drue Dun, RN Outcome: Progressing 07/26/2020 1717 by Drue Dun, RN Outcome: Progressing   Problem: Coping: Goal: Level of anxiety will decrease 07/26/2020 1851 by Drue Dun, RN Outcome: Progressing 07/26/2020 1717 by Drue Dun, RN Outcome: Progressing   Problem: Elimination: Goal: Will not experience complications related to bowel motility 07/26/2020 1851 by Drue Dun, RN Outcome: Progressing 07/26/2020 1717 by Drue Dun, RN Outcome: Progressing Goal: Will not experience complications related to urinary retention 07/26/2020 1851 by Drue Dun, RN Outcome: Progressing 07/26/2020 1717 by Drue Dun, RN Outcome: Progressing   Problem: Pain Managment: Goal: General experience of comfort will improve 07/26/2020 1851 by Drue Dun, RN Outcome: Progressing 07/26/2020 1717 by Drue Dun, RN Outcome: Progressing   Problem: Safety: Goal: Ability to remain free from injury will improve 07/26/2020 1851 by Drue Dun, RN Outcome: Progressing 07/26/2020 1717 by Drue Dun, RN Outcome: Progressing   Problem: Skin Integrity: Goal: Risk for impaired skin integrity will decrease 07/26/2020 1851 by Drue Dun, RN Outcome: Progressing 07/26/2020 1717 by Drue Dun, RN Outcome: Progressing   Problem: Activity: Goal: Ability to tolerate increased activity will improve 07/26/2020 1851 by Drue Dun, RN Outcome: Progressing 07/26/2020 1717 by Drue Dun, RN Outcome: Progressing   Problem: Role Relationship: Goal: Method of communication will improve 07/26/2020 1851 by Drue Dun, RN Outcome: Progressing 07/26/2020 1717 by Drue Dun, RN Outcome: Progressing   Problem: Education: Goal: Knowledge of disease or condition will improve 07/26/2020 1851 by Drue Dun, RN Outcome: Progressing 07/26/2020 1717 by Drue Dun, RN Outcome: Progressing Goal: Knowledge of secondary prevention will improve 07/26/2020 1851 by Drue Dun, RN Outcome: Progressing 07/26/2020 1717 by Drue Dun, RN Outcome: Progressing Goal: Knowledge of patient specific risk  factors addressed and post discharge goals established will improve 07/26/2020 1851 by Drue Dun, RN Outcome: Progressing 07/26/2020 1717 by Drue Dun, RN Outcome: Progressing Goal: Individualized Educational Video(s) 07/26/2020 1851 by Drue Dun, RN Outcome: Progressing 07/26/2020 1717 by Drue Dun, RN Outcome: Progressing   Problem: Coping: Goal: Will verbalize positive feelings about self 07/26/2020 1851 by Drue Dun, RN Outcome: Progressing 07/26/2020 1717 by Drue Dun, RN Outcome: Progressing Goal: Will identify appropriate support needs 07/26/2020 1851 by  Drue Dun, RN Outcome: Progressing 07/26/2020 1717 by Drue Dun, RN Outcome: Progressing   Problem: Health Behavior/Discharge Planning: Goal: Ability to manage health-related needs will improve 07/26/2020 1851 by Drue Dun, RN Outcome: Progressing 07/26/2020 1717 by Drue Dun, RN Outcome: Progressing   Problem: Self-Care: Goal: Ability to participate in self-care as condition permits will improve 07/26/2020 1851 by Drue Dun, RN Outcome: Progressing 07/26/2020 1717 by Drue Dun, RN Outcome: Progressing Goal: Verbalization of feelings and concerns over difficulty with self-care will improve 07/26/2020 1851 by Drue Dun, RN Outcome: Progressing 07/26/2020 1717 by Drue Dun, RN Outcome: Progressing Goal: Ability to communicate needs accurately will improve 07/26/2020 1851 by Drue Dun, RN Outcome: Progressing 07/26/2020 1717 by Drue Dun, RN Outcome: Progressing   Problem: Nutrition: Goal: Risk of aspiration will decrease 07/26/2020 1851 by Drue Dun, RN Outcome: Progressing 07/26/2020 1717 by Drue Dun, RN Outcome: Progressing Goal: Dietary intake will improve 07/26/2020 1851 by Drue Dun, RN Outcome: Progressing 07/26/2020 1717 by Drue Dun, RN Outcome: Progressing   Problem: Ischemic  Stroke/TIA Tissue Perfusion: Goal: Complications of ischemic stroke/TIA will be minimized 07/26/2020 1851 by Drue Dun, RN Outcome: Progressing 07/26/2020 1717 by Drue Dun, RN Outcome: Progressing   Problem: Safety: Goal: Non-violent Restraint(s) 07/26/2020 1851 by Drue Dun, RN Outcome: Progressing 07/26/2020 1717 by Drue Dun, RN Outcome: Progressing

## 2020-07-26 NOTE — Progress Notes (Signed)
Inpatient Rehab Admissions Coordinator:  Consult received.  However, noted pt tested positive for COVID last week (unknown date) and a positive COVID test in chart is dated 07/24/20.  Patients are eligible to be considered for admit to CIR when cleared from airborne precautions by acute MD or Infectious Disease.  Otherwise, they will need to be >20 days from their positive test with recovery/improvement in symptoms or 2 negative tests.  Will continue to follow.   Wolfgang Phoenix, MS, CCC-SLP Admissions Coordinator [phone]

## 2020-07-26 NOTE — Progress Notes (Signed)
Rehab Admissions Coordinator Note:  Patient was screened by Clois Dupes for appropriateness for an Inpatient Acute Rehab Consult per therapy recommendations.   At this time, we are recommending Inpatient Rehab consult. I will place order per protocol.  Clois Dupes RN MSN 07/26/2020, 1:43 PM  I can be reached at [phone].

## 2020-07-26 NOTE — Evaluation (Signed)
Occupational Therapy Evaluation Patient Details Name: Justin Mason MRN: [id number] DOB: 01-10-1981 Today's Date: 07/26/2020    History of Present Illness 40 yo male with PMH of morbid obesity, DM2, HLD, HTN, known COVID-19 infection (dx less than 1 week PTA), history of shoulder surgery with residual R shoulder who presented to ED 1/19 with aphasia and RUE weakness beginning at about 1030 AM 07/24/2020 and progressing until 1230 same day with R facial droop. 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  Outside of window for tpa. Went to [facility] for revascularization but angioplasty of MCA unsuccessful due to resistance.   Clinical Impression   PT admitted with COVID (+) R M1 MCA L ICA occulsion and L 1 stenosis. Pt currently with functional limitiations due to the deficits listed below (see OT problem list). Pt currently with flaccid R UE and activation noted in R LE. Pt liable and crying at various times during session some more appropriate than others. Question accuracy with responses of head nods.  Pt will benefit from skilled OT to increase their independence and safety with adls and balance to allow discharge CIR..     Follow Up Recommendations  CIR    Equipment Recommendations  Wheelchair (measurements OT);Wheelchair cushion (measurements OT);3 in 1 bedside commode    Recommendations for Other Services Rehab consult     Precautions / Restrictions Precautions Precautions: Fall Precaution Comments: R inattention, R severe hemiparesis (near plegia) Restrictions Weight Bearing Restrictions: No      Mobility Bed Mobility Overal bed mobility: Needs Assistance Bed Mobility: Rolling;Sidelying to Sit;Sit to Supine Rolling: Max assist;+2 for physical assistance Sidelying to sit: Max assist;+2 for physical assistance;+2 for safety/equipment;HOB elevated   Sit to supine: Total assist;+2 for physical  assistance;+2 for safety/equipment;HOB elevated   General bed mobility comments: max +2 to roll to L for trunk and LE management, pt utilizing LUE on bedrail to assist. Max-total +2 for supine<>sit for trunk and LE management, repositioning.    Transfers Overall transfer level: Needs assistance Equipment used: 2 person hand held assist Transfers: Sit to/from Stand;Lateral/Scoot Transfers Sit to Stand: Max assist;+2 physical assistance;+2 safety/equipment;From elevated surface        Lateral/Scoot Transfers: Mod assist;+2 physical assistance;+2 safety/equipment General transfer comment: max +3 (PT, OT, RN) for power up, rise, steady, blocking out RLE, and sustaining upright. Standing tolerance x10 seconds. Pt able to scoot EOB towards L with mod +2 for RLE alignment, guiding hips/bed pads.    Balance Overall balance assessment: Needs assistance Sitting-balance support: Single extremity supported;Feet supported Sitting balance-Leahy Scale: Fair Sitting balance - Comments: preference for R lateral leaning, correctable with verbal cues for upright sitting. Able to sit EOB unsupported Postural control: Right lateral lean Standing balance support: Bilateral upper extremity supported;During functional activity Standing balance-Leahy Scale: Zero Standing balance comment: max +3 to stand                           ADL either performed or assessed with clinical judgement   ADL Overall ADL's : Needs assistance/impaired     Grooming: Wash/dry face;Moderate assistance;Sitting Grooming Details (indicate cue type and reason): wiping face with L UE and neglected R side of face Upper Body Bathing: Total assistance   Lower Body Bathing: Total assistance   Upper Body Dressing : Total assistance   Lower Body Dressing: Total assistance  General ADL Comments: pt progressed to EOB and sit<>stand this session. pt tolerating RA during session     Vision    Additional Comments: tracking all directions . R inattention     Perception     Praxis      Pertinent Vitals/Pain Pain Assessment: Faces Faces Pain Scale: Hurts a little bit Pain Location: R foot, during ROM Pain Descriptors / Indicators: Sore;Discomfort Pain Intervention(s): Monitored during session     Hand Dominance Right   Extremity/Trunk Assessment Upper Extremity Assessment Upper Extremity Assessment: RUE deficits/detail RUE Deficits / Details: flaccid RUE Sensation: decreased proprioception;decreased light touch RUE Coordination: decreased gross motor;decreased fine motor   Lower Extremity Assessment Lower Extremity Assessment: Defer to PT evaluation RLE Sensation:  (per pt report during screen, unsure of accuracy)   Cervical / Trunk Assessment Cervical / Trunk Assessment: Normal (forward shoulders due to body habitus)   Communication Communication Communication: No difficulties   Cognition Arousal/Alertness: Awake/alert Behavior During Therapy: WFL for tasks assessed/performed Overall Cognitive Status: Impaired/Different from baseline Area of Impairment: Orientation;Following commands;Safety/judgement;Awareness;Problem solving                 Orientation Level: Disoriented to;Place     Following Commands: Follows one step commands inconsistently;Follows one step commands with increased time Safety/Judgement: Decreased awareness of safety;Decreased awareness of deficits Awareness: Intellectual Problem Solving: Difficulty sequencing;Decreased initiation;Requires verbal cues;Requires tactile cues General Comments: Pt inconsistently follows mobility commands, can be perseverative. Tearful during session, both appropriately and inappropriately question ability to answer questions appropriately ( using head nods). pt inconsistently   General Comments  RA stable VSS    Exercises     Shoulder Instructions      Home Living Family/patient expects to be  discharged to:: Private residence Living Arrangements: Parent Available Help at Discharge: Family;Available 24 hours/day Type of Home: House Home Access: Stairs to enter [employer] of Steps: 4   Home Layout: One level     Bathroom Shower/Tub: Chief Strategy Officer: Standard     Home Equipment: None          Prior Functioning/Environment Level of Independence: Independent        Comments: Pt is a pipe fitter and welder (heavy lifting, pipe carrying, HVAC unit repair). Information obtained from calling pt's mother, [name]        OT Problem List: Decreased strength;Decreased activity tolerance;Impaired balance (sitting and/or standing);Decreased range of motion;Decreased safety awareness;Decreased knowledge of use of DME or AE;Decreased cognition;Decreased coordination;Obesity      OT Treatment/Interventions: Self-care/ADL training;Therapeutic exercise;Energy conservation;DME and/or AE instruction;Therapeutic activities;Balance training;Patient/family education;Manual therapy;Modalities;Neuromuscular education;Cognitive remediation/compensation    OT Goals(Current goals can be found in the care plan section) Acute Rehab OT Goals Patient Stated Goal: get pt as independent as possible OT Goal Formulation: Patient unable to participate in goal setting Time For Goal Achievement: 08/09/20 Potential to Achieve Goals: Good  OT Frequency: Min 2X/week   Barriers to D/C: Decreased caregiver support          Co-evaluation PT/OT/SLP Co-Evaluation/Treatment: Yes Reason for Co-Treatment: For patient/therapist safety;To address functional/ADL transfers;Necessary to address cognition/behavior during functional activity   OT goals addressed during session: ADL's and self-care;Proper use of Adaptive equipment and DME;Strengthening/ROM      AM-PAC OT "6 Clicks" Daily Activity     Outcome Measure Help from another person eating meals?: A Little Help from  another person taking care of personal grooming?: A Little Help from another person toileting, which includes using toliet, bedpan, or  urinal?: A Lot Help from another person bathing (including washing, rinsing, drying)?: A Lot Help from another person to put on and taking off regular upper body clothing?: None Help from another person to put on and taking off regular lower body clothing?: A Lot 6 Click Score: 16   End of Session Nurse Communication: Mobility status;Precautions  Activity Tolerance: Patient tolerated treatment well Patient left: in bed;with call bell/phone within reach;with nursing/sitter in room  OT Visit Diagnosis: Unsteadiness on feet (R26.81);Muscle weakness (generalized) (M62.81)                Time: 6237-6283 OT Time Calculation (min): 34 min Charges:  OT General Charges $OT Visit: 1 Visit OT Evaluation $OT Eval Moderate Complexity: 1 Mod   Brynn, OTR/L  Acute Rehabilitation Services Pager: [phone] Office: [phone] .   Mateo Flow 07/26/2020, 5:27 PM

## 2020-07-26 NOTE — Progress Notes (Signed)
Initial Nutrition Assessment  DOCUMENTATION CODES:   Not applicable  INTERVENTION:    Ensure Enlive po BID, each supplement provides 350 kcal and 20 grams of protein  MVI daily   NUTRITION DIAGNOSIS:   Increased nutrient needs related to acute illness (COVID infection) as evidenced by estimated needs.  GOAL:   Patient will meet greater than or equal to 90% of their needs  MONITOR:   PO intake,Supplement acceptance,Diet advancement,Weight trends,Labs,I & O's  REASON FOR ASSESSMENT:   Other (Comment)    ASSESSMENT:   Patient with PMH significant for HLD, DM, HTN, and systolic murmur. Presents this admission with acute MCA stroke and known COVID 19 infection.   1/19 NIR unsuccessful angioplasty of MCA 1/20 extubated   Remains hemiplegic on R side and has not been speaking. Passed FEEs this afternoon diet advanced to DYS2 with thin liquids. Unable to obtain nutrition and weight history at this time. RD to provide supplementation to maximize kcal and protein this admission.   Weight noted to be stable since 11/19. Utilize 172.6 kg as EDW for now.   UOP: 1250 ml x 24 hrs   Drips: NS @ 75 ml/hr  Medications: 500 mg Vit C, SS novolog, metformin, 220 mg zinc sulfate  Labs: CBG 137-149  Diet Order:   Diet Order            DIET DYS 2 Room service appropriate? No; Fluid consistency: Thin  Diet effective now                 EDUCATION NEEDS:   Not appropriate for education at this time  Skin:  Skin Assessment: Skin Integrity Issues: Skin Integrity Issues:: Incisions Incisions: R groin  Last BM:  PTA  Height:   Ht Readings from Last 1 Encounters:  07/12/20 6\' 1"  (1.854 m)    Weight:   Wt Readings from Last 1 Encounters:  07/25/20 (!) 172.6 kg    BMI:  Body mass index is 50.2 kg/m.  Estimated Nutritional Needs:   Kcal:  2300-2500 kcal  Protein:  115-130 grams  Fluid:  >/= 2 L/day  07/27/20 RD, LDN Clinical Nutrition Pager listed in  [facility]

## 2020-07-26 NOTE — Plan of Care (Signed)
°  Problem: Education: Goal: Knowledge of General Education information will improve Description: Including pain rating scale, medication(s)/side effects and non-pharmacologic comfort measures Outcome: Progressing   Problem: Health Behavior/Discharge Planning: Goal: Ability to manage health-related needs will improve Outcome: Progressing   Problem: Clinical Measurements: Goal: Ability to maintain clinical measurements within normal limits will improve Outcome: Progressing Goal: Will remain free from infection Outcome: Progressing Goal: Diagnostic test results will improve Outcome: Progressing Goal: Respiratory complications will improve Outcome: Progressing Goal: Cardiovascular complication will be avoided Outcome: Progressing   Problem: Activity: Goal: Risk for activity intolerance will decrease Outcome: Progressing   Problem: Nutrition: Goal: Adequate nutrition will be maintained Outcome: Progressing   Problem: Coping: Goal: Level of anxiety will decrease Outcome: Progressing   Problem: Elimination: Goal: Will not experience complications related to bowel motility Outcome: Progressing Goal: Will not experience complications related to urinary retention Outcome: Progressing   Problem: Pain Managment: Goal: General experience of comfort will improve Outcome: Progressing   Problem: Safety: Goal: Ability to remain free from injury will improve Outcome: Progressing   Problem: Skin Integrity: Goal: Risk for impaired skin integrity will decrease Outcome: Progressing   Problem: Activity: Goal: Ability to tolerate increased activity will improve Outcome: Progressing   Problem: Role Relationship: Goal: Method of communication will improve Outcome: Progressing   Problem: Education: Goal: Knowledge of disease or condition will improve Outcome: Progressing Goal: Knowledge of secondary prevention will improve Outcome: Progressing Goal: Knowledge of patient specific  risk factors addressed and post discharge goals established will improve Outcome: Progressing Goal: Individualized Educational Video(s) Outcome: Progressing   Problem: Coping: Goal: Will verbalize positive feelings about self Outcome: Progressing Goal: Will identify appropriate support needs Outcome: Progressing   Problem: Health Behavior/Discharge Planning: Goal: Ability to manage health-related needs will improve Outcome: Progressing   Problem: Self-Care: Goal: Ability to participate in self-care as condition permits will improve Outcome: Progressing Goal: Verbalization of feelings and concerns over difficulty with self-care will improve Outcome: Progressing Goal: Ability to communicate needs accurately will improve Outcome: Progressing   Problem: Nutrition: Goal: Risk of aspiration will decrease Outcome: Progressing Goal: Dietary intake will improve Outcome: Progressing   Problem: Ischemic Stroke/TIA Tissue Perfusion: Goal: Complications of ischemic stroke/TIA will be minimized Outcome: Progressing   Problem: Safety: Goal: Non-violent Restraint(s) Outcome: Progressing

## 2020-07-26 NOTE — Evaluation (Signed)
Physical Therapy Evaluation Patient Details Name: Justin Mason MRN: [id number] DOB: 23-Sep-1980 Today's Date: 07/26/2020   History of Present Illness  40 yo male with PMH of morbid obesity, DM2, HLD, HTN, known COVID-19 infection (dx less than 1 week PTA), history of shoulder surgery with residual R shoulder who presented to ED 1/19 with aphasia and RUE weakness beginning at about 1030 AM 07/24/2020 and progressing until 1230 same day with R facial droop. 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  Outside of window for tpa. Went to [facility] for revascularization but angioplasty of MCA unsuccessful due to resistance.  Clinical Impression   Pt presents with R sided weakness, impaired cognition, aphasia, difficulty performing mobility tasks, hypertonicity RLE in plantarflexors and hip extensors, impaired sitting and standing balance, and decreased activity tolerance. Pt to benefit from acute PT to address deficits. Pt requiring max-total +2 assist for bed mobility and standing today, requiring significant RLE blocking given severe RLE weakness. Pt working very hard during therapy session, per pt's mother pt is very motivated. PT recommending CIR consult to maximize pt's functional recovery s/p CVA. PT to progress mobility as tolerated, and will continue to follow acutely.      Follow Up Recommendations CIR;Supervision/Assistance - 24 hour    Equipment Recommendations  Other (comment) (TBD)    Recommendations for Other Services       Precautions / Restrictions Precautions Precautions: Fall Precaution Comments: R inattention, R severe hemiparesis (near plegia) Restrictions Weight Bearing Restrictions: No      Mobility  Bed Mobility Overal bed mobility: Needs Assistance Bed Mobility: Rolling;Sidelying to Sit;Sit to Supine Rolling: Max assist;+2 for physical assistance Sidelying to sit: Max assist;+2  for physical assistance;+2 for safety/equipment;HOB elevated   Sit to supine: Total assist;+2 for physical assistance;+2 for safety/equipment;HOB elevated   General bed mobility comments: max +2 to roll to L for trunk and LE management, pt utilizing LUE on bedrail to assist. Max-total +2 for supine<>sit for trunk and LE management, repositioning.    Transfers Overall transfer level: Needs assistance Equipment used: 2 person hand held assist Transfers: Sit to/from Stand;Lateral/Scoot Transfers Sit to Stand: Max assist;+2 physical assistance;+2 safety/equipment;From elevated surface        Lateral/Scoot Transfers: Mod assist;+2 physical assistance;+2 safety/equipment General transfer comment: max +3 (PT, OT, RN) for power up, rise, steady, blocking out RLE, and sustaining upright. Standing tolerance x10 seconds. Pt able to scoot EOB towards L with mod +2 for RLE alignment, guiding hips/bed pads.  Ambulation/Gait             General Gait Details: nt  Information systems manager Rankin (Stroke Patients Only) Modified Rankin (Stroke Patients Only) Pre-Morbid Rankin Score: No symptoms Modified Rankin: Severe disability     Balance Overall balance assessment: Needs assistance Sitting-balance support: Single extremity supported;Feet supported Sitting balance-Leahy Scale: Fair Sitting balance - Comments: preference for R lateral leaning, correctable with verbal cues for upright sitting. Able to sit EOB unsupported Postural control: Right lateral lean Standing balance support: Bilateral upper extremity supported;During functional activity Standing balance-Leahy Scale: Zero Standing balance comment: max +3 to stand                             Pertinent Vitals/Pain Pain Assessment: Faces Faces Pain  Scale: Hurts a little bit Pain Location: R foot, during ROM Pain Descriptors / Indicators: Sore;Discomfort Pain Intervention(s): Limited  activity within patient's tolerance;Monitored during session;Repositioned    Home Living Family/patient expects to be discharged to:: Private residence Living Arrangements: Parent Available Help at Discharge: Family;Available 24 hours/day Type of Home: House Home Access: Stairs to enter   [employer] of Steps: 4 Home Layout: One level Home Equipment: None      Prior Function Level of Independence: Independent         Comments: Pt is a pipe fitter and welder (heavy lifting, pipe carrying, HVAC unit repair). Information obtained from calling pt's mother, Justin Mason     Hand Dominance   Dominant Hand: Right    Extremity/Trunk Assessment   Upper Extremity Assessment Upper Extremity Assessment: Defer to OT evaluation;RUE deficits/detail RUE Deficits / Details: history of rotator cuff repair, R index finger dysfunction per mother    Lower Extremity Assessment Lower Extremity Assessment: RLE deficits/detail RLE Deficits / Details: Hypertonicity of plantarflexors, hip extensors. 2-/5 knee extension (sitting), 1/5 knee flexion (supine), 1/5 toe flex/ext RLE Sensation: WNL (per pt report during screen, unsure of accuracy) RLE Coordination: decreased fine motor;decreased gross motor    Cervical / Trunk Assessment Cervical / Trunk Assessment: Normal  Communication   Communication: No difficulties  Cognition Arousal/Alertness: Awake/alert Behavior During Therapy: WFL for tasks assessed/performed Overall Cognitive Status: Impaired/Different from baseline Area of Impairment: Orientation;Following commands;Safety/judgement;Awareness;Problem solving                 Orientation Level: Disoriented to;Place     Following Commands: Follows one step commands inconsistently;Follows one step commands with increased time Safety/Judgement: Decreased awareness of safety;Decreased awareness of deficits Awareness: Intellectual Problem Solving: Difficulty sequencing;Decreased  initiation;Requires verbal cues;Requires tactile cues General Comments: Pt answering yes/no via head nods, nods "yes" we are in the library but does correct self to nod "yes" to being in the hospital. Pt inconsistently follows mobility commands, can be perseverative.      General Comments General comments (skin integrity, edema, etc.): smooth pursuits [facility]    Exercises General Exercises - Lower Extremity Long Arc Quad: AAROM;Right;5 reps;Seated   Assessment/Plan    PT Assessment Patient needs continued PT services  PT Problem List Decreased strength;Decreased mobility;Decreased safety awareness;Impaired tone;Decreased knowledge of precautions;Decreased coordination;Decreased activity tolerance;Decreased balance;Decreased knowledge of use of DME;Pain;Decreased cognition;Obesity       PT Treatment Interventions DME instruction;Therapeutic activities;Gait training;Therapeutic exercise;Patient/family education;Stair training;Functional mobility training;Neuromuscular re-education;Balance training;Cognitive remediation    PT Goals (Current goals can be found in the Care Plan section)  Acute Rehab PT Goals Patient Stated Goal: get pt as independent as possible PT Goal Formulation: With family Time For Goal Achievement: 08/09/20 Potential to Achieve Goals: Good    Frequency Min 4X/week   Barriers to discharge        Co-evaluation PT/OT/SLP Co-Evaluation/Treatment: Yes Reason for Co-Treatment: For patient/therapist safety;To address functional/ADL transfers;Complexity of the patient's impairments (multi-system involvement) PT goals addressed during session: Mobility/safety with mobility;Balance;Strengthening/ROM         AM-PAC PT "6 Clicks" Mobility  Outcome Measure Help needed turning from your back to your side while in a flat bed without using bedrails?: A Lot Help needed moving from lying on your back to sitting on the side of a flat bed without using bedrails?: A Lot Help  needed moving to and from a bed to a chair (including a wheelchair)?: Total Help needed standing up from a chair using your arms (e.g.,  wheelchair or bedside chair)?: A Lot Help needed to walk in hospital room?: Total Help needed climbing 3-5 steps with a railing? : Total 6 Click Score: 9    End of Session   Activity Tolerance: Patient tolerated treatment well;Patient limited by fatigue Patient left: in bed;with call bell/phone within reach;with bed alarm set;with nursing/sitter in room (in egress position) Nurse Communication: Mobility status PT Visit Diagnosis: Hemiplegia and hemiparesis;Other abnormalities of gait and mobility (R26.89) Hemiplegia - Right/Left: Right Hemiplegia - dominant/non-dominant: Dominant Hemiplegia - caused by: Cerebral infarction    Time: 4709-2957 PT Time Calculation (min) (ACUTE ONLY): 39 min   Charges:   PT Evaluation $PT Eval Low Complexity: 1 Low PT Treatments $Therapeutic Activity: 8-22 mins      Kyo Cocuzza S, PT Acute Rehabilitation Services Pager [phone]  Office [phone]  Hailley Byers E Stroup 07/26/2020, 1:16 PM

## 2020-07-27 DIAGNOSIS — I6602 Occlusion and stenosis of left middle cerebral artery: Secondary | ICD-10-CM | POA: Diagnosis not present

## 2020-07-27 DIAGNOSIS — I639 Cerebral infarction, unspecified: Secondary | ICD-10-CM | POA: Diagnosis not present

## 2020-07-27 LAB — GLUCOSE, CAPILLARY
Glucose-Capillary: 157 mg/dL — ABNORMAL HIGH (ref 70–99)
Glucose-Capillary: 129 mg/dL — ABNORMAL HIGH (ref 70–99)
Glucose-Capillary: 145 mg/dL — ABNORMAL HIGH (ref 70–99)
Glucose-Capillary: 149 mg/dL — ABNORMAL HIGH (ref 70–99)
Glucose-Capillary: 102 mg/dL — ABNORMAL HIGH (ref 70–99)

## 2020-07-27 MED ORDER — SERTRALINE HCL 50 MG PO TABS
25.0000 mg | ORAL_TABLET | Freq: Every day | ORAL | Status: DC
  Administered 2020-07-27 – 2020-08-09 (×14): 25 mg via ORAL
  Filled 2020-07-27 (×14): qty 1

## 2020-07-27 MED ORDER — CLONIDINE HCL 0.1 MG PO TABS
0.2000 mg | ORAL_TABLET | Freq: Two times a day (BID) | ORAL | Status: DC
  Administered 2020-07-27 – 2020-08-09 (×26): 0.2 mg via ORAL
  Filled 2020-07-27 (×26): qty 2

## 2020-07-27 NOTE — Progress Notes (Signed)
PROGRESS NOTE    Justin Mason  OZH:[id number] DOB: Feb 23, 1981 DOA: 07/24/2020 PCP: Eber Jones, NP   Brief Narrative:  40 yo M PMH morbid obesity, DM2, HLD, HTN, known COVID-19 infection (dx less than 1 week PTA) who presented to ED 1/19 with aphasia and RUE weakness beginning at about 1030 AM 07/24/2020 and progressing until 1230 same day with R facial droop. CT H with concern for L sided infarcts CTA with short segment occlusion of R M1/MCA, L ICA occlusion, proximal L M1, stenosis of PCAs. Outside of window for tpa. Went to [facility] 07/24/20 for revascularization but angioplasty of MCA unsuccessful due to resistance.   Assessment & Plan:   Active Problems:   Stroke (cerebrum) (HCC)   Middle cerebral artery stenosis, left  L ICA occlusion, severe R MCA stenosis - Unsuccessful revascularization due to chronicity - Cause of decompensation of chronic disease unclear - possible hypoglycemic episode.  - Neuro following - DAPT for 3 months then transition to single agent - Statin ongoing - Profound R sided weakness, aphasia - Passed SLP 22nd; continue to advance as tolerated  Incidental covid positive - Routine admit screening - without symptoms - Hypoxia due to procedure below - resolved - No indication for treatment  Acute respiratory failure post procedure - Extubated 20th - no treatment needed for COVID without symptoms or O2 requiremen  Hypertension.  - Completed permissive HTN window - continue IV medications - advance PO as necessary with speech evaluations   DVT prophylaxis: Heparin Code Status: Full Family Communication: Mother  Status is: Inpt  Dispo: The patient is from: Home              Anticipated d/c is to: CIR              Anticipated d/c date is: TBD              Patient currently NOT medically stable for discharge  Consultants:   Neuro, PCCM  Procedures:   Attempted angioplasty of MCA, failed 07/24/20  Antimicrobials:  None indicated    Subjective: No acute issues/events overnight - non verbal; tearful at interview. Focused on improvement in swallow eval today.  Objective: Vitals:   07/26/20 2128 07/26/20 2325 07/27/20 0402 07/27/20 0725  BP: (!) 157/86 (!) 155/90 (!) 155/103 (!) 167/93  Pulse: 78  65   Resp: 20 (!) 21 19 18   Temp: 99.2 F (37.3 C) 98.3 F (36.8 C) 98.2 F (36.8 C) 99.8 F (37.7 C)  TempSrc: Oral Oral Oral Oral  SpO2:   98%   Weight:        Intake/Output Summary (Last 24 hours) at 07/27/2020 0802 Last data filed at 07/26/2020 1400 Gross per 24 hour  Intake 449.39 ml  Output 400 ml  Net 49.39 ml   Filed Weights   07/24/20 1615 07/25/20 0604 07/26/20 1540  Weight: (!) 172.8 kg (!) 172.6 kg (!) 166.3 kg    Examination:  General exam:Tearful but no acute distress  Respiratory system: Clear to auscultation. Respiratory effort normal. Cardiovascular system: S1 & S2 heard, RRR. No JVD, murmurs, rubs, gallops or clicks. No pedal edema. Gastrointestinal system: Abdomen is nondistended, soft and nontender. No organomegaly or masses felt. Normal bowel sounds heard. Central nervous system: Alert and oriented. No focal neurological deficits. Extremities: Flaccid RUE/RLE extremities; minimal sensation. Skin: No rashes, lesions or ulcers Psychiatry: Judgement and insight appear normal. Mood & affect appropriate.   Data Reviewed: I have personally reviewed following labs and  imaging studies  CBC: Recent Labs  Lab 07/24/20 1546 07/24/20 1554 07/25/20 0515  WBC 6.4  --  4.2  NEUTROABS 3.1  --  2.9  HGB 18.0* 17.3* 15.1  HCT 51.0 51.0 43.1  MCV 87.5  --  87.2  PLT 201  --  173   Basic Metabolic Panel: Recent Labs  Lab 07/24/20 1546 07/24/20 1554 07/25/20 0515  NA 141 142 139  K 3.6 3.7 4.1  CL 102 103 103  CO2 24  --  23  GLUCOSE 164* 158* 165*  BUN 18 22* 17  CREATININE 1.10 1.00 1.05  CALCIUM 10.0  --  9.0   GFR: Estimated Creatinine Clearance: 153 mL/min (by C-G formula  based on SCr of 1.05 mg/dL). Liver Function Tests: Recent Labs  Lab 07/24/20 1546  AST 51*  ALT 84*  ALKPHOS 65  BILITOT 0.8  PROT 7.9  ALBUMIN 4.1   No results for input(s): LIPASE, AMYLASE in the last 168 hours. No results for input(s): AMMONIA in the last 168 hours. Coagulation Profile: Recent Labs  Lab 07/24/20 1546  INR 1.0   Cardiac Enzymes: No results for input(s): CKTOTAL, CKMB, CKMBINDEX, TROPONINI in the last 168 hours. BNP (last 3 results) No results for input(s): PROBNP in the last 8760 hours. HbA1C: Recent Labs    07/25/20 0515  HGBA1C 6.2*   CBG: Recent Labs  Lab 07/26/20 1744 07/26/20 2115 07/26/20 2324 07/27/20 0406 07/27/20 0743  GLUCAP 138* 116* 108* 157* 129*   Lipid Profile: Recent Labs    07/24/20 1604 07/25/20 0515  CHOL  --  93  HDL  --  19*  LDLCALC  --  36  TRIG 156* 190*  CHOLHDL  --  4.9   Thyroid Function Tests: No results for input(s): TSH, T4TOTAL, FREET4, T3FREE, THYROIDAB in the last 72 hours. Anemia Panel: Recent Labs    07/24/20 1600  FERRITIN 270   Sepsis Labs: Recent Labs  Lab 07/24/20 1600 07/24/20 1604 07/25/20 0515  PROCALCITON <0.10  --   --   LATICACIDVEN  --  2.9* 1.6    Recent Results (from the past 240 hour(s))  MRSA PCR Screening     Status: None   Collection Time: 07/25/20  2:14 AM   Specimen: Nasal Mucosa; Nasopharyngeal  Result Value Ref Range Status   MRSA by PCR NEGATIVE NEGATIVE Final    Comment:        The GeneXpert MRSA Assay (FDA approved for NASAL specimens only), is one component of a comprehensive MRSA colonization surveillance program. It is not intended to diagnose MRSA infection nor to guide or monitor treatment for MRSA infections. Performed at [facility], [address] [place], [place] [address]   Blood Culture (routine x 2)     Status: None (Preliminary result)   Collection Time: 07/25/20  5:15 AM   Specimen: BLOOD RIGHT HAND  Result Value Ref Range Status    Specimen Description BLOOD RIGHT HAND  Final   Special Requests   Final    BOTTLES DRAWN AEROBIC ONLY Blood Culture adequate volume   Culture   Final    NO GROWTH 1 DAY Performed at [facility], [address] [place], [place] [address]    Report Status PENDING  Incomplete  Blood Culture (routine x 2)     Status: None (Preliminary result)   Collection Time: 07/25/20  5:15 AM   Specimen: BLOOD RIGHT HAND  Result Value Ref Range Status  Specimen Description BLOOD RIGHT HAND  Final   Special Requests   Final    BOTTLES DRAWN AEROBIC ONLY Blood Culture adequate volume   Culture   Final    NO GROWTH 1 DAY Performed at [facility], [address] [place], [place] [address]    Report Status PENDING  Incomplete         Radiology Studies: MR ANGIO HEAD WO CONTRAST  Result Date: 07/25/2020 CLINICAL DATA:  Stroke, follow up EXAM: MRI HEAD WITHOUT CONTRAST MRA HEAD WITHOUT CONTRAST TECHNIQUE: Multiplanar, multiecho pulse sequences of the brain and surrounding structures were obtained without intravenous contrast. Angiographic images of the head were obtained using MRA technique without contrast. COMPARISON:  07/24/2020 and prior. FINDINGS: MRI HEAD FINDINGS Brain: Sequela of acute left MCA territory infarct. Tiny right parietal restricted diffusion may be artifactual versus an additional acute insult. No intracranial hemorrhage. No midline shift, ventriculomegaly or extra-axial fluid collection. Incidental midline retrocerebellar arachnoid cyst without significant mass effect or parenchymal edema. Vascular: Please see MRA. Skull and upper cervical spine: Normal marrow signal. Sinuses/Orbits: Grossly normal orbits. Pneumatized paranasal sinuses and mastoid air cells. Other: Please note susceptibility artifact limits evaluation of the anterior cranial fossa and face. MRA HEAD FINDINGS Anterior circulation: Short segment occlusion of the right M1 segment, unchanged. Distal right MCA  branches remain patent.Proximal left M1 segment occlusion with severely diminished opacification of the distal left MCA branches. Patent right ICA. Occlusion of the left ICA terminus and proximal left A1 segment. Luminal irregularity of the distal left A1 segment. Visualized bilateral ACAs are otherwise patent. Posterior circulation: Patent V4 segments and PICA. Patent basilar and superior cerebellar arteries. Fetal origin of the left PCA. Right PCA appears patent and without significant luminal narrowing. Anatomic variants: Please see above. IMPRESSION: Acute left MCA territory infarct. Occlusions involving the left ICA terminus and left M1 segment with diminished opacification of distal left MCA branches. Short-segment right M1 occlusion is unchanged. Left A1 segment proximal occlusion and vessel wall irregularity, unchanged. Smaller 2-3 mm acute right parietal infarct versus artifact. No intracranial hemorrhage. These results were called by telephone at the time of interpretation on 07/25/2020 at 4:41 pm to provider Dr. Pearlean Brownie, Who verbally acknowledged these results. Electronically Signed   By: Stana Bunting M.D.   On: 07/25/2020 16:43   MR BRAIN WO CONTRAST  Result Date: 07/25/2020 CLINICAL DATA:  Stroke, follow up EXAM: MRI HEAD WITHOUT CONTRAST MRA HEAD WITHOUT CONTRAST TECHNIQUE: Multiplanar, multiecho pulse sequences of the brain and surrounding structures were obtained without intravenous contrast. Angiographic images of the head were obtained using MRA technique without contrast. COMPARISON:  07/24/2020 and prior. FINDINGS: MRI HEAD FINDINGS Brain: Sequela of acute left MCA territory infarct. Tiny right parietal restricted diffusion may be artifactual versus an additional acute insult. No intracranial hemorrhage. No midline shift, ventriculomegaly or extra-axial fluid collection. Incidental midline retrocerebellar arachnoid cyst without significant mass effect or parenchymal edema. Vascular:  Please see MRA. Skull and upper cervical spine: Normal marrow signal. Sinuses/Orbits: Grossly normal orbits. Pneumatized paranasal sinuses and mastoid air cells. Other: Please note susceptibility artifact limits evaluation of the anterior cranial fossa and face. MRA HEAD FINDINGS Anterior circulation: Short segment occlusion of the right M1 segment, unchanged. Distal right MCA branches remain patent.Proximal left M1 segment occlusion with severely diminished opacification of the distal left MCA branches. Patent right ICA. Occlusion of the left ICA terminus and proximal left A1 segment. Luminal irregularity of the distal left A1 segment. Visualized bilateral  ACAs are otherwise patent. Posterior circulation: Patent V4 segments and PICA. Patent basilar and superior cerebellar arteries. Fetal origin of the left PCA. Right PCA appears patent and without significant luminal narrowing. Anatomic variants: Please see above. IMPRESSION: Acute left MCA territory infarct. Occlusions involving the left ICA terminus and left M1 segment with diminished opacification of distal left MCA branches. Short-segment right M1 occlusion is unchanged. Left A1 segment proximal occlusion and vessel wall irregularity, unchanged. Smaller 2-3 mm acute right parietal infarct versus artifact. No intracranial hemorrhage. These results were called by telephone at the time of interpretation on 07/25/2020 at 4:41 pm to provider Dr. Pearlean BrownieSethi, Who verbally acknowledged these results. Electronically Signed   By: Stana Buntinghikanele  Emekauwa M.D.   On: 07/25/2020 16:43   ECHOCARDIOGRAM COMPLETE  Result Date: 07/25/2020    ECHOCARDIOGRAM REPORT   Patient Name:   Arn MedalJOHN L Lao Date of Exam: 07/25/2020 Medical Rec #:  [id number]     Height:       73.0 in Accession #:    [id number]    Weight:       380.5 lb Date of Birth:  [birth year]     BSA:          2.828 m Patient Age:    39 years      BP:           157/90 mmHg Patient Gender: M             HR:           75 bpm. Exam  Location:  Inpatient Procedure: 2D Echo, Cardiac Doppler, Color Doppler and Intracardiac            Opacification Agent Indications:    Stroke  History:        Patient has prior history of Echocardiogram examinations, most                 recent 07/04/2020. Risk Factors:Hypertension, Diabetes and                 Dyslipidemia. Covid positive.  Sonographer:    Sheralyn Boatmanina West RDCS Referring Phys: [phone] MCNEILL P [place]  Sonographer Comments: Technically difficult study due to poor echo windows and patient is morbidly obese. Image acquisition challenging due to patient body habitus. IMPRESSIONS  1. Left ventricular ejection fraction, by estimation, is 60 to 65%. The left ventricle has normal function. The left ventricle has no regional wall motion abnormalities. There is moderate concentric left ventricular hypertrophy. Left ventricular diastolic parameters are indeterminate.  2. Right ventricular systolic function is normal. The right ventricular size is normal.  3. The mitral valve is normal in structure. No evidence of mitral valve regurgitation. No evidence of mitral stenosis.  4. The aortic valve is tricuspid. Aortic valve regurgitation is not visualized. No aortic stenosis is present.  5. The inferior vena cava is dilated in size with >50% respiratory variability, suggesting right atrial pressure of 8 mmHg. Comparison(s): No significant change from prior study. Conclusion(s)/Recommendation(s): Normal biventricular function without evidence of hemodynamically significant valvular heart disease. No intracardiac source of embolism detected on this transthoracic study. A transesophageal echocardiogram is recommended to exclude cardiac source of embolism if clinically indicated. FINDINGS  Left Ventricle: Left ventricular ejection fraction, by estimation, is 60 to 65%. The left ventricle has normal function. The left ventricle has no regional wall motion abnormalities. Definity contrast agent was given IV to delineate  the left ventricular  endocardial borders. The left ventricular internal cavity size was  normal in size. There is moderate concentric left ventricular hypertrophy. Left ventricular diastolic parameters are indeterminate. Right Ventricle: The right ventricular size is normal. No increase in right ventricular wall thickness. Right ventricular systolic function is normal. Left Atrium: Left atrial size was normal in size. Right Atrium: Right atrial size was normal in size. Pericardium: There is no evidence of pericardial effusion. Presence of pericardial fat pad. Mitral Valve: The mitral valve is normal in structure. No evidence of mitral valve regurgitation. No evidence of mitral valve stenosis. Tricuspid Valve: The tricuspid valve is normal in structure. Tricuspid valve regurgitation is not demonstrated. No evidence of tricuspid stenosis. Aortic Valve: The aortic valve is tricuspid. Aortic valve regurgitation is not visualized. No aortic stenosis is present. Pulmonic Valve: The pulmonic valve was not well visualized. Pulmonic valve regurgitation is not visualized. Aorta: The aortic root, ascending aorta, aortic arch and descending aorta are all structurally normal, with no evidence of dilitation or obstruction. Venous: The inferior vena cava is dilated in size with greater than 50% respiratory variability, suggesting right atrial pressure of 8 mmHg. IAS/Shunts: The atrial septum is grossly normal.  LEFT VENTRICLE PLAX 2D LVIDd:         5.20 cm      Diastology LVIDs:         3.30 cm      LV e' medial:    5.33 cm/s LV PW:         1.50 cm      LV E/e' medial:  12.9 LV IVS:        1.50 cm      LV e' lateral:   6.97 cm/s LVOT diam:     2.70 cm      LV E/e' lateral: 9.9 LVOT Area:     5.73 cm  LV Volumes (MOD) LV vol d, MOD A2C: 99.4 ml LV vol d, MOD A4C: 208.0 ml LV vol s, MOD A2C: 42.0 ml LV vol s, MOD A4C: 89.8 ml LV SV MOD A2C:     57.4 ml LV SV MOD A4C:     208.0 ml LV SV MOD BP:      95.4 ml RIGHT VENTRICLE              IVC RV S prime:     17.20 cm/s  IVC diam: 2.20 cm TAPSE (M-mode): 1.2 cm LEFT ATRIUM           Index       RIGHT ATRIUM           Index LA diam:      3.20 cm 1.13 cm/m  RA Area:     11.80 cm LA Vol (A2C): 17.8 ml 6.29 ml/m  RA Volume:   23.20 ml  8.20 ml/m LA Vol (A4C): 49.9 ml 17.64 ml/m   AORTA Ao Root diam: 3.95 cm Ao Asc diam:  3.60 cm MITRAL VALVE MV Area (PHT): 2.60 cm    SHUNTS MV Decel Time: 292 msec    Systemic Diam: 2.70 cm MV E velocity: 68.90 cm/s MV A velocity: 60.70 cm/s MV E/A ratio:  1.14 Jodelle Red MD Electronically signed by Jodelle Red MD Signature Date/Time: 07/25/2020/6:29:29 PM    Final         Scheduled Meds: . vitamin C  500 mg Oral Daily  . aspirin  300 mg Rectal Daily   Or  . aspirin  325 mg Oral Daily  . Chlorhexidine Gluconate Cloth  6 each Topical Q0600  .  cholecalciferol  1,000 Units Oral QPM  . cloNIDine  0.1 mg Oral BID  . feeding supplement  237 mL Oral BID BM  . heparin injection (subcutaneous)  7,500 Units Subcutaneous Q8H  . hydrochlorothiazide  25 mg Oral Daily  . insulin aspart  0-15 Units Subcutaneous Q4H  . irbesartan  300 mg Oral Daily  . mouth rinse  15 mL Mouth Rinse BID  . meloxicam  15 mg Oral Daily  . metFORMIN  1,000 mg Oral BID WC  . metoprolol tartrate  100 mg Oral BID  . multivitamin with minerals  1 tablet Oral Daily  . pantoprazole sodium  40 mg Oral Daily  . rosuvastatin  10 mg Oral Daily  . sodium chloride flush  3 mL Intravenous Once  . zinc sulfate  220 mg Oral QPM   Continuous Infusions: . sodium chloride 75 mL/hr at 07/26/20 1400  . sodium chloride    . clevidipine       LOS: 3 days   Time spent:  Azucena Fallen, DO [facility]  If 7PM-7AM, please contact night-coverage [email or web address]  07/27/2020, 8:02 AM

## 2020-07-27 NOTE — Progress Notes (Signed)
Physical Therapy Treatment Patient Details Name: Justin Mason MRN: [id number] DOB: 02-Dec-1980 Today's Date: 07/27/2020    History of Present Illness 40 yo male with PMH of morbid obesity, DM2, HLD, HTN, known COVID-19 infection (dx less than 1 week PTA), history of shoulder surgery with residual R shoulder who presented to ED 1/19 with aphasia and RUE weakness beginning at about 1030 AM 07/24/2020 and progressing until 1230 same day with R facial droop. 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  Outside of window for tpa. Went to [facility] for revascularization but angioplasty of MCA unsuccessful due to resistance.    PT Comments    Pt is very motivated to participate and improve, sometimes even being impulsive trying to initiate tasks prior to being fully set-up and ready for them. Pt has already made significant improvements in his independence with functional mobility in that he required modAx2 to roll to R, maxAx2 with HOB elevated to transition sidelying > sitting R EOB, and only modAx1 with minAx1 to come to stand from EOB to the stedy. In addition, pt was able to maintain his balance standing in the stedy, performing pre-gait training through shifting his weight and attempting to lift his feet with only min-modA. Pt displays R gluteal and quads weakness impacting his ability to maintain extension in standing, placing him at risk for falls. Pt positioned sitting in recliner with chair alarm on and with verbalized instructions to perform bil simultaneous leg exercises consisting of hip abd/add, seated marching, and LAQ to promote neural cross over for muscle activation. RN made aware of pt's current functional status and need for lift equipment. Current recommendations remain appropriate. Will continue to follow acutely.    Follow Up Recommendations  CIR;Supervision/Assistance - 24 hour     Equipment  Recommendations  Other (comment) (TBD)    Recommendations for Other Services       Precautions / Restrictions Precautions Precautions: Fall Precaution Comments: R inattention, R severe hemiparesis (near plegia) Restrictions Weight Bearing Restrictions: No    Mobility  Bed Mobility Overal bed mobility: Needs Assistance Bed Mobility: Rolling;Sidelying to Sit Rolling: +2 for physical assistance;Mod assist Sidelying to sit: Max assist;+2 for physical assistance;+2 for safety/equipment;HOB elevated       General bed mobility comments: mod +2 to roll to R for managing R arm to avoid injury and managing R leg, cuing pt to pull with L hand on R bed rail. MaxA+2 for sidelying > sit R EOB for trunk and LE management, repositioning. Pt initiating attempt to push up when cued with L hand.  Transfers Overall transfer level: Needs assistance Equipment used: Ambulation equipment used Transfers: Sit to/from [employer] Sit to Stand: Mod assist;Min assist;+2 physical assistance;+2 safety/equipment;From elevated surface Stand pivot transfers: Total assist;+2 physical assistance;+2 safety/equipment       General transfer comment: Positioned pt's feet on platform of sara stedy with pt being impulsive trying to pull on stedy often, needing cues to wait until prepared. Cued pt for hand placement, needing hand-over-hand to maintain R on bar. Bed pads placed between pt's skin and the stedy to prevent rubbing as possible. Tactile cues and assistance provided at R quad and R glut to extend with pt initiating transfer, modAx1 with minAx1 to come to stand from EOB to stedy. 1x from stedy with only minAx1 to manage R foot/leg placement and prevent R lean.  Ambulation/Gait  General Gait Details: Pre-gait training in stedy, cuing pt to shift weight laterally between R and L leg and attempt to clear foot (minimal lift off on R toe with attempt), needing min-modA to maintain  balance due to intermittent R hip and knee flexion with stance phase and R lean.   Stairs             Wheelchair Mobility    Modified Rankin (Stroke Patients Only) Modified Rankin (Stroke Patients Only) Pre-Morbid Rankin Score: No symptoms Modified Rankin: Severe disability     Balance Overall balance assessment: Needs assistance Sitting-balance support: Single extremity supported;Feet supported;Bilateral upper extremity supported;No upper extremity supported Sitting balance-Leahy Scale: Fair Sitting balance - Comments: preference for R lateral leaning, correctable with verbal cues for upright sitting. Able to sit EOB with 2-no UE support, min guard. Postural control: Right lateral lean Standing balance support: Bilateral upper extremity supported;During functional activity Standing balance-Leahy Scale: Poor Standing balance comment: Min-modA for standing balance in stedy with UE support.                            Cognition Arousal/Alertness: Awake/alert Behavior During Therapy: Flat affect;Impulsive (crying several times during session) Overall Cognitive Status: Impaired/Different from baseline Area of Impairment: Following commands;Safety/judgement;Awareness;Problem solving                       Following Commands: Follows one step commands inconsistently;Follows one step commands with increased time Safety/Judgement: Decreased awareness of safety;Decreased awareness of deficits Awareness: Emergent Problem Solving: Difficulty sequencing;Decreased initiation;Requires verbal cues;Requires tactile cues General Comments: Pt inconsistently follows mobility commands, can be impulsive at times. Tearful during session, answering questions appropriately majority of time ( using head nods). Required simple commands to sequence tasks.      Exercises      General Comments General comments (skin integrity, edema, etc.): Educated pt to perform bil  simultaneous LAQ, marching, and hip abd/add sitting in recliner to promote crossover muscle activation      Pertinent Vitals/Pain Pain Assessment: No/denies pain Pain Intervention(s): Monitored during session    Home Living                      Prior Function            PT Goals (current goals can now be found in the care plan section) Acute Rehab PT Goals Patient Stated Goal: get pt as independent as possible PT Goal Formulation: With patient Time For Goal Achievement: 08/09/20 Potential to Achieve Goals: Good Progress towards PT goals: Progressing toward goals    Frequency    Min 4X/week      PT Plan Current plan remains appropriate    Co-evaluation              AM-PAC PT "6 Clicks" Mobility   Outcome Measure  Help needed turning from your back to your side while in a flat bed without using bedrails?: A Lot Help needed moving from lying on your back to sitting on the side of a flat bed without using bedrails?: A Lot Help needed moving to and from a bed to a chair (including a wheelchair)?: Total Help needed standing up from a chair using your arms (e.g., wheelchair or bedside chair)?: A Lot Help needed to walk in hospital room?: Total Help needed climbing 3-5 steps with a railing? : Total 6 Click Score: 9    End of Session Equipment  Utilized During Treatment: Gait belt Activity Tolerance: Patient tolerated treatment well Patient left: in chair;with call bell/phone within reach;with chair alarm set Nurse Communication: Mobility status;Need for lift equipment PT Visit Diagnosis: Hemiplegia and hemiparesis;Other abnormalities of gait and mobility (R26.89);Unsteadiness on feet (R26.81);Muscle weakness (generalized) (M62.81);Difficulty in walking, not elsewhere classified (R26.2);Other symptoms and signs involving the nervous system (R29.898) Hemiplegia - Right/Left: Right Hemiplegia - dominant/non-dominant: Dominant Hemiplegia - caused by: Cerebral  infarction     Time: 9935-7017 PT Time Calculation (min) (ACUTE ONLY): 41 min  Charges:  $Gait Training: 8-22 mins $Therapeutic Activity: 23-37 mins                     Raymond Gurney, PT, DPT Acute Rehabilitation Services  Pager: [phone] Office: [phone]    Jewel Baize 07/27/2020, 3:26 PM

## 2020-07-27 NOTE — Progress Notes (Addendum)
STROKE TEAM PROGRESS NOTE  INTERVAL HISTORY No acute overnight events.  Patient evaluated at bedside this morning, no family present in the room.  Patient is alert, awake but still globally aphasic.  Patient is definitely much more interactive today, follow commands and had some labile mood.  Well to move left extremities well but still no further improvement in right extremities.  He is on thin liquid diet now.  As patient is being interactive he is explained about his stroke and he shows understanding by nodding yes.  He is explained he needs to take aspirin and Plavix for 3 months and then aspirin alone and need to modify risk factors for prevention of another stroke like controlling his blood pressure well, keeping his diabetes under control, decreased weight and walk with physical therapy as long as needed for further improvement.  Planning to start patient on antidepressant today.  Blood pressure well controlled.  Neurological exam is improving.  From stroke team point of view patient is doing well and stroke team will sign off.  Vitals:   07/26/20 2325 07/27/20 0402 07/27/20 0725 07/27/20 1226  BP: (!) 155/90 (!) 155/103 (!) 167/93 (!) 167/99  Pulse:  65  68  Resp: (!) 21 19 18 20   Temp: 98.3 F (36.8 C) 98.2 F (36.8 C) 99.8 F (37.7 C) 99 F (37.2 C)  TempSrc: Oral Oral Oral Axillary  SpO2:  98%  98%  Weight:       CBC:  Recent Labs  Lab 07/24/20 1546 07/24/20 1554 07/25/20 0515  WBC 6.4  --  4.2  NEUTROABS 3.1  --  2.9  HGB 18.0* 17.3* 15.1  HCT 51.0 51.0 43.1  MCV 87.5  --  87.2  PLT 201  --  173   Basic Metabolic Panel:  Recent Labs  Lab 07/24/20 1546 07/24/20 1554 07/25/20 0515  NA 141 142 139  K 3.6 3.7 4.1  CL 102 103 103  CO2 24  --  23  GLUCOSE 164* 158* 165*  BUN 18 22* 17  CREATININE 1.10 1.00 1.05  CALCIUM 10.0  --  9.0   Lipid Panel:  Recent Labs  Lab 07/25/20 0515  CHOL 93  TRIG 190*  HDL 19*  CHOLHDL 4.9  VLDL 38  LDLCALC 36   HgbA1c:   Recent Labs  Lab 07/25/20 0515  HGBA1C 6.2*   Urine Drug Screen:Negative  IMAGING past 24 hours  MR Brain MR Angio Head wo contrast 07/25/2020  IMPRESSION: Acute left MCA territory infarct. Occlusions involving the left ICA terminus and left M1 segment with diminished opacification of distal left MCA branches.  Short-segment right M1 occlusion is unchanged.  Left A1 segment proximal occlusion and vessel wall irregularity, unchanged.  Smaller 2-3 mm acute right parietal infarct versus artifact. No intracranial hemorrhage.   CT Head code stroke wo contrast 07/24/2020  IMPRESSION: 1. Small foci of hypodensity within the left caudate and left thalamus, may represent small infarcts, age indeterminate. 2. Aspect score is 9.  CT Angio Head w or wo contrast CT cerebral perfusion w contrast 07/24/2020  IMPRESSION: 1. Short segment occlusion of the proximal right M1/MCA with decrease caliber and opacification of the corresponding vascular tree. 2. Occlusion of the left ICA terminus and proximal left M1 segment with decrease caliber and contrast opacification of the corresponding vascular tree. 3. Luminal irregularity of the bilateral ACA and PCA vascular trees with moderate stenosis at the right P2/PCA segment. 4. Diffuse intracranial vasculopathy may be related to severe  atherosclerosis versus vasculitis. 5. Scattered opacities within the bilateral upper lobes. Correlation with dedicated chest study suggested.  Chest x-ray 07/24/2020  IMPRESSION: 1. Endotracheal tube tip about 2.4 cm superior to the carina. 2. Low lung volumes results in central bronchovascular crowding. Patchy airspace disease at the right suprahilar lung, and left greater than right base either reflecting atelectasis versus multifocal infection  CT angiogram 07/24/2020  Findings. 1.Severe preocclusive stenosis of RT MCA M 1 seg. 2.Severelt severe stenosis of LT ICA supraclinoid segment  with flow in the Lt ACA. Occluded LT MCA Proximally with retrograde filling to distal M1 from prominent Lt PCA collaterals. Unsuccessful attempt at angioplasty of MCA due to  sig resistance to advancement of  microcatheter through the occlusion.. 1F angioseal for hemostasis in the Rt groin.. Distal pulses all dopplerable.  Echocardiogram 07/25/2020 IMPRESSIONS    1. Left ventricular ejection fraction, by estimation, is 60 to 65%. The  left ventricle has normal function. The left ventricle has no regional  wall motion abnormalities. There is moderate concentric left ventricular  hypertrophy. Left ventricular  diastolic parameters are indeterminate.  2. Right ventricular systolic function is normal. The right ventricular  size is normal.  3. The mitral valve is normal in structure. No evidence of mitral valve  regurgitation. No evidence of mitral stenosis.  4. The aortic valve is tricuspid. Aortic valve regurgitation is not  visualized. No aortic stenosis is present.  5. The inferior vena cava is dilated in size with >50% respiratory  variability, suggesting right atrial pressure of 8 mmHg.      PHYSICAL EXAM  General:   morbidly obese middle-aged Caucasian male lying comfortably in bed, NAD Eyes: No scleral injection HENT: No OP obstrucion Head: Normocephalic.  Cardiovascular: Normal rate and regular rhythm.  2/6 systolic murmur Respiratory: No respiratory distress, breathing comfortably on room air GI: Soft.  No distension. There is no tenderness.  Neurological: General: NAD Mental Status: Alert, awake, globally aphasic but able to follow commands.  Mute and unable to speak any words.  Much more interactive today and improving. Cranial Nerves: II:   pupils equal, round, reactive to light and accommodation, blinks to left side threat, not on right side III,IV, VI: ptosis not present, extra-ocular motions intact bilaterally.  Left gaze preference but able to look all the  way to the right to command. V,VII: smile asymmetric, right facial weakness  VIII: hearing normal  XII: midline tongue extension without atrophy or fasciculations  Motor: Able to move left upper and lower extremities against gravity 4+/5 , right dense hemiplegia with 0/5 strength on upper extremity, 1/5 on lower extremity with flaccidity and hypotonia Sensory: Retracts on pinching on left extremities but no reaction on right extremities  Deep Tendon Reflexes:  Right: Upper Extremity   Left: Upper extremity   biceps (C-5 to C-6) 2/4   biceps (C-5 to C-6) 2/4 tricep (C7) 2/4    triceps (C7) 2/4 Brachioradialis (C6) 2/4  Brachioradialis (C6) 2/4  Lower Extremity Lower Extremity  quadriceps (L-2 to L-4) 2/4   quadriceps (L-2 to L-4) 2/4 Achilles (S1) 2/4   Achilles (S1) 2/4  Plantars: Right: downgoing   Left: downgoing Cerebellar: normal finger-to-nose from left, but unable to perform from right Gait: Deferred   ASSESSMENT/PLAN  Mr. Arn MedalJohn L Fleissner is a 40 y.o. male with PMHx of hypertension, hyperlipidemia, diabetes, morbid obesity who presents with difficulty speaking.  CT head shows small focus of hypodensity within left caudate and left thalamus.  Emergent CT  angiogram which revealed significant intracranial atherosclerosis with an occluded left M1.  CT perfusion was suggestive of large area of hypoperfusion without core infarct on left and was taken for angiogram but unfortunately at the time of angiogram it was discovered that the blockage was likely chronic and therefore no attempt at reopening of it was performed.  tPA not given as outside of window.  Stroke, likely due to left middle cerebral artery occlusion, s/p attempted unsuccessful mechanical thrombectomy and angioplasty stenting suspected intracranial stenosis is chronic as there is bilateral and there is evidence of distal collateral beyond left MCA occlusion   Code Stroke CT head : Small foci of hypodensity within the left  caudate and left thalamus, may represent small infarcts, age indeterminate. Aspect score is 9.  CTA head:a).  Anterior circulation: Short segment occlusion of the proximal right M1/MCA with decrease caliber and opacification of the corresponding vascular tree. Occlusion of the left ICA terminus and proximal left M1 segment with decrease caliber and contrast opacification of the corresponding vascular tree. b). Posterior circulation: Luminal irregularity the of the bilateral posterior cerebral arteries with moderate stenosis at the right P2 segment.  CT perfusion : a). Short segment occlusion of the proximal right M1/MCA with decrease caliber and opacification of the corresponding vascular tree. b). Occlusion of the left ICA terminus and proximal left M1 segment with decrease caliber and contrast opacification of the corresponding vascular tree.   MRI : Acute left MCA territory infarct. Occlusions involving the left ICA        terminus and left M1 segment with diminished opacification of distal          left MCA branches.  2D Echo: Left ventricular ejection fraction, by estimation, is 60 to 65%.  LDL 36  HgbA1c 6.2  VTE prophylaxis -subQ heparin    Diet   DIET DYS 3 Room service appropriate? Yes; Fluid consistency: Thin    No antithrombotic prior to admission, now on aspirin 300 mg suppository daily.  Plan is to do DAPT with aspirin 81 mg and Plavix 75 mg for 3 months and then continue aspirin.  Therapy recommendations: CIR  Disposition: Pending  Hypertension  Home meds: Patient is metoprolol 100 mg, amlodipine 10 mg, clonidine 8.1 mg twice daily, olmesartan-hydrochlorothiazide.  Stable . Permissive hypertension (OK if < 220/120) but gradually normalize in 5-7 days . Long-term BP goal normotensive  Hyperlipidemia  Home meds: Atorvastatin 10 mg  Crestor 10 mg  LDL 36, goal < 70  Continue statin at discharge  Diabetes type II Uncontrolled  Home meds: Dapagliflozin 10  mg  HgbA1c 6.2, goal < 7.0  CBGs Recent Labs    07/27/20 0406 07/27/20 0743 07/27/20 1213  GLUCAP 157* 129* 145*      SSI   COVID-19 infection  Tested positive on 07/24/2020  Lactic acid elevated to 2.9  Breathing normally on room air  Morbid obesity  BMI 50.20  Patient will be advised to lose weight and exercise.   Other Stroke Risk Factors   Not Cigarette smoker, but uses snuff , will be advised to stop smoking  ETOH use, alcohol level : Uses 7 standard drinks of alcohol per week, will be advised to stop alcohol use.  ? Obstructive sleep apnea   Other Active Problems  GERD  Arthritis  Hospital day # 3  Patient remains aphasic with dense right hemiplegia but is able to follow some simple commands.  He appears emotionally labile and likely depressed and would benefit with  trial of antidepressant.  Speech therapy has cleared him for diet.  Continue COVID precautions as per primary team.he will need dual antiplatelet therapy for 3 months and then aspirin alone.  Aggressive risk factor modification.  Continue ongoing physical occupational therapy and likely transfer to inpatient rehab when bed available over the next few days.  Long discussion with the patient and with his mother with whom I spoke to over the phone and answered questions.  Discussed with Dr. Natale Milch..  Greater than 50% time during this 25-minute visit was spent on counseling and coordination of care and discussion with care team.  Stroke team will sign off.  Kindly call for questions.      Delia Heady, MD Medical Director [facility] St[facility] Pager: [phone] 07/27/2020 1:13 PM   To contact Stroke Continuity provider, please refer to [email or web address]. After hours, contact General Neurology

## 2020-07-28 DIAGNOSIS — I639 Cerebral infarction, unspecified: Secondary | ICD-10-CM | POA: Diagnosis not present

## 2020-07-28 LAB — GLUCOSE, CAPILLARY
Glucose-Capillary: 124 mg/dL — ABNORMAL HIGH (ref 70–99)
Glucose-Capillary: 128 mg/dL — ABNORMAL HIGH (ref 70–99)
Glucose-Capillary: 143 mg/dL — ABNORMAL HIGH (ref 70–99)
Glucose-Capillary: 148 mg/dL — ABNORMAL HIGH (ref 70–99)
Glucose-Capillary: 163 mg/dL — ABNORMAL HIGH (ref 70–99)
Glucose-Capillary: 213 mg/dL — ABNORMAL HIGH (ref 70–99)

## 2020-07-28 NOTE — Progress Notes (Signed)
PROGRESS NOTE    Justin Mason  HER:[id number] DOB: 04/01/1981 DOA: 07/24/2020 PCP: Eber Jones, NP   Brief Narrative:   40 yo M PMH morbid obesity, DM2, HLD, HTN, known COVID-19 infection (dx less than 1 week PTA) who presented to ED 1/19 with aphasia and RUE weakness beginning at about 1030 AM 07/24/2020 and progressing until 1230 same day with R facial droop. CT H with concern for L sided infarcts CTA with short segment occlusion of R M1/MCA, L ICA occlusion, proximal L M1, stenosis of PCAs. Outside of window for tpa. Went to [facility] 07/24/20 for revascularization but angioplasty of MCA unsuccessful due to resistance.  07/28/20: No acute events ON.    Assessment & Plan:  L ICA occlusion, severe R MCA stenosis     - presenting with right side weakness and aphasia     - Unsuccessful revascularization due to chronicity     - Cause of decompensation of chronic disease unclear - possible hypoglycemic episode.      - Neuro eval'd: DAPT for 3 months then transition to single agent     - continue statin     - SLP Diet Recommendations: Dysphagia 3 (Mech soft) solids;Thin liquid         - Liquid Administration via Cup;Straw         - Medication Administration Crushed with puree         - Compensations: Slow rate; Minimize environmental distractions; Small sips/bites; Lingual sweep for clearance of pocketing         - Postural Changes: Seated upright at 90 degrees  Incidental covid positive Acute respiratory failure post procedure     - hypoxia due to procedure; resolved     - no current indication for COVID treatment  Hypertension.      - completed permissive HTN window     - HCTZ, irbesartan, clonidine, metoprolol  DVT prophylaxis: Heparin Code Status: FULL Family Communication: None at bedside.   Status is: Inpatient  Remains inpatient appropriate because:Unsafe d/c plan   Dispo: The patient is from: Home              Anticipated d/c is to: SNF              Anticipated d/c date  is: 2 days              Patient currently is not medically stable to d/c.   Difficult to place patient No  Consultants:   Neurology  Procedures:   Attempted angioplasty of MCA, failed 07/24/20  Subjective: No acute events ON  Objective: Vitals:   07/27/20 1628 07/27/20 1904 07/27/20 2305 07/28/20 0416  BP: (!) 154/92 (!) 149/82 97/64 (!) 145/91  Pulse:  66 (!) 59 67  Resp:  18  16  Temp: 99.3 F (37.4 C) 98.6 F (37 C) 99.2 F (37.3 C) 99.8 F (37.7 C)  TempSrc: Oral Oral Oral Oral  SpO2:  97% 96% 97%  Weight:        Intake/Output Summary (Last 24 hours) at 07/28/2020 0719 Last data filed at 07/28/2020 0055 Gross per 24 hour  Intake 1728.54 ml  Output 875 ml  Net 853.54 ml   Filed Weights   07/24/20 1615 07/25/20 0604 07/26/20 1540  Weight: (!) 172.8 kg (!) 172.6 kg (!) 166.3 kg    Examination:  General: 40 y.o. male resting in bed in NAD Eyes: PERRL, normal sclera ENMT: Nares patent w/o discharge, orophaynx clear, dentition normal, ears  w/o discharge/lesions/ulcers Neck: Supple, trachea midline Cardiovascular: RRR, +S1, S2, no m/g/r, equal pulses throughout Respiratory: CTABL, no w/r/r, normal WOB GI: BS+, NDNT, no masses noted, no organomegaly noted MSK: No e/c/c Skin: No rashes, bruises, ulcerations noted Neuro: Alert, tracking around room, weakness of RLE/RUE  Data Reviewed: I have personally reviewed following labs and imaging studies.  CBC: Recent Labs  Lab 07/24/20 1546 07/24/20 1554 07/25/20 0515  WBC 6.4  --  4.2  NEUTROABS 3.1  --  2.9  HGB 18.0* 17.3* 15.1  HCT 51.0 51.0 43.1  MCV 87.5  --  87.2  PLT 201  --  173   Basic Metabolic Panel: Recent Labs  Lab 07/24/20 1546 07/24/20 1554 07/25/20 0515  NA 141 142 139  K 3.6 3.7 4.1  CL 102 103 103  CO2 24  --  23  GLUCOSE 164* 158* 165*  BUN 18 22* 17  CREATININE 1.10 1.00 1.05  CALCIUM 10.0  --  9.0   GFR: Estimated Creatinine Clearance: 153 mL/min (by C-G formula based on SCr  of 1.05 mg/dL). Liver Function Tests: Recent Labs  Lab 07/24/20 1546  AST 51*  ALT 84*  ALKPHOS 65  BILITOT 0.8  PROT 7.9  ALBUMIN 4.1   No results for input(s): LIPASE, AMYLASE in the last 168 hours. No results for input(s): AMMONIA in the last 168 hours. Coagulation Profile: Recent Labs  Lab 07/24/20 1546  INR 1.0   Cardiac Enzymes: No results for input(s): CKTOTAL, CKMB, CKMBINDEX, TROPONINI in the last 168 hours. BNP (last 3 results) No results for input(s): PROBNP in the last 8760 hours. HbA1C: No results for input(s): HGBA1C in the last 72 hours. CBG: Recent Labs  Lab 07/27/20 1213 07/27/20 1618 07/27/20 2106 07/28/20 0055 07/28/20 0402  GLUCAP 145* 149* 102* 124* 143*   Lipid Profile: No results for input(s): CHOL, HDL, LDLCALC, TRIG, CHOLHDL, LDLDIRECT in the last 72 hours. Thyroid Function Tests: No results for input(s): TSH, T4TOTAL, FREET4, T3FREE, THYROIDAB in the last 72 hours. Anemia Panel: No results for input(s): VITAMINB12, FOLATE, FERRITIN, TIBC, IRON, RETICCTPCT in the last 72 hours. Sepsis Labs: Recent Labs  Lab 07/24/20 1600 07/24/20 1604 07/25/20 0515  PROCALCITON <0.10  --   --   LATICACIDVEN  --  2.9* 1.6    Recent Results (from the past 240 hour(s))  MRSA PCR Screening     Status: None   Collection Time: 07/25/20  2:14 AM   Specimen: Nasal Mucosa; Nasopharyngeal  Result Value Ref Range Status   MRSA by PCR NEGATIVE NEGATIVE Final    Comment:        The GeneXpert MRSA Assay (FDA approved for NASAL specimens only), is one component of a comprehensive MRSA colonization surveillance program. It is not intended to diagnose MRSA infection nor to guide or monitor treatment for MRSA infections. Performed at [facility], [address] [place], [place] [address]   Blood Culture (routine x 2)     Status: None (Preliminary result)   Collection Time: 07/25/20  5:15 AM   Specimen: BLOOD RIGHT HAND  Result Value Ref Range  Status   Specimen Description BLOOD RIGHT HAND  Final   Special Requests   Final    BOTTLES DRAWN AEROBIC ONLY Blood Culture adequate volume   Culture   Final    NO GROWTH 2 DAYS Performed at [facility], [address] [place], [place] [address]    Report Status PENDING  Incomplete  Blood  Culture (routine x 2)     Status: None (Preliminary result)   Collection Time: 07/25/20  5:15 AM   Specimen: BLOOD RIGHT HAND  Result Value Ref Range Status   Specimen Description BLOOD RIGHT HAND  Final   Special Requests   Final    BOTTLES DRAWN AEROBIC ONLY Blood Culture adequate volume   Culture   Final    NO GROWTH 2 DAYS Performed at [facility], [address], [place], [place] [address]    Report Status PENDING  Incomplete      Radiology Studies: No results found.   Scheduled Meds: . vitamin C  500 mg Oral Daily  . aspirin  300 mg Rectal Daily   Or  . aspirin  325 mg Oral Daily  . Chlorhexidine Gluconate Cloth  6 each Topical Q0600  . cholecalciferol  1,000 Units Oral QPM  . cloNIDine  0.2 mg Oral BID  . feeding supplement  237 mL Oral BID BM  . heparin injection (subcutaneous)  7,500 Units Subcutaneous Q8H  . hydrochlorothiazide  25 mg Oral Daily  . insulin aspart  0-15 Units Subcutaneous Q4H  . irbesartan  300 mg Oral Daily  . mouth rinse  15 mL Mouth Rinse BID  . meloxicam  15 mg Oral Daily  . metFORMIN  1,000 mg Oral BID WC  . metoprolol tartrate  100 mg Oral BID  . multivitamin with minerals  1 tablet Oral Daily  . pantoprazole sodium  40 mg Oral Daily  . rosuvastatin  10 mg Oral Daily  . sertraline  25 mg Oral Daily  . sodium chloride flush  3 mL Intravenous Once  . zinc sulfate  220 mg Oral QPM   Continuous Infusions: . sodium chloride 75 mL/hr at 07/27/20 1814  . sodium chloride       LOS: 4 days    Time spent: 25 minutes spent in the coordination of care today.    Teddy Spike, DO [facility]  If 7PM-7AM, please contact  night-coverage [email or web address] 07/28/2020, 7:19 AM

## 2020-07-28 NOTE — TOC Initial Note (Signed)
Transition of Care [facility]) - Initial/Assessment Note    Patient Details  Name: Justin Mason MRN: [id number] Date of Birth: 1981-06-28  Transition [facility]) CM/SW Contact:    Carley Hammed, LCSWA Phone Number: 07/28/2020, 10:16 AM  Clinical Narrative:                 CSW was advised by MD that the plan was for pt to go to CIR. The barrier is that pt is Covid positive and CIR will not take before 20 days post positive test. MD requested pt be worked up for SNF. CSW attempted to contact pt's father to discuss recommendation, voicemail was left. FL2 was completed. Faxed out beyond covid facilities with the hope that they could take pt before 20 days post covid. Will need to find a place in network. SW will continue to follow for DC.  Expected Discharge Plan: Skilled Nursing Facility Barriers to Discharge: Continued Medical Work up,SNF Pending bed offer   Patient Goals and CMS Choice Patient states their goals for this hospitalization and ongoing recovery are:: Pt unable to participate in goal setting due to disorientation. CMS Medicare.gov Compare Post Acute Care list provided to:: Patient Represenative (must comment) (Father) Choice offered to / list presented to : Parent  Expected Discharge Plan and Services Expected Discharge Plan: Skilled Nursing Facility       Living arrangements for the past 2 months: Single Family Home                                      Prior Living Arrangements/Services Living arrangements for the past 2 months: Single Family Home Lives with:: Parents Patient language and need for interpreter reviewed:: Yes Do you feel safe going back to the place where you live?: Yes      Need for Family Participation in Patient Care: Yes (Comment) Care giver support system in place?: Yes (comment)   Criminal Activity/Legal Involvement Pertinent to Current Situation/Hospitalization: No - Comment as needed  Activities of Daily Living   ADL Screening  (condition at time of admission) Patient's cognitive ability adequate to safely complete daily activities?: Yes Is the patient deaf or have difficulty hearing?: No Does the patient have difficulty seeing, even when wearing glasses/contacts?: No Does the patient have difficulty concentrating, remembering, or making decisions?: Yes Patient able to express need for assistance with ADLs?: No Does the patient have difficulty dressing or bathing?: Yes Independently performs ADLs?: No Communication: Dependent Is this a change from baseline?: Change from baseline, expected to last >3 days Dressing (OT): Dependent Is this a change from baseline?: Change from baseline, expected to last >3 days Grooming: Dependent Is this a change from baseline?: Change from baseline, expected to last >3 days Feeding: Dependent Is this a change from baseline?: Change from baseline, expected to last >3 days Bathing: Dependent Is this a change from baseline?: Change from baseline, expected to last >3 days Toileting: Dependent Is this a change from baseline?: Change from baseline, expected to last >3days In/Out Bed: Dependent Is this a change from baseline?: Change from baseline, expected to last >3 days Walks in Home: Dependent Is this a change from baseline?: Change from baseline, expected to last >3 days Does the patient have difficulty walking or climbing stairs?: Yes Weakness of Legs: Right Weakness of Arms/Hands: Right  Permission Sought/Granted Permission sought to share information with : Family Supports Permission granted to  share information with : Yes, Verbal Permission Granted  Share Information with NAME: Raymondo Garcialopez     Permission granted to share info w Relationship: Father  Permission granted to share info w Contact Information: [phone]  Emotional Assessment Appearance:: Other (Comment Required (Unable to Assess) Attitude/Demeanor/Rapport: Unable to Assess Affect (typically observed):  Unable to Assess Orientation: : Oriented to Self Alcohol / Substance Use: Not Applicable Psych Involvement: No (comment)  Admission diagnosis:  Stroke (cerebrum) (HCC) [I63.9] History of ETT [Z92.89] Middle cerebral artery stenosis, left [I66.02] Cerebrovascular accident (CVA) due to thrombosis of precerebral artery (HCC) [I63.00] Patient Active Problem List   Diagnosis Date Noted  . 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  . 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 Morbid obesity (HCC) 05/24/2020  . Cardiac murmur 05/24/2020  . Arthritis   . High cholesterol   . Hyperlipemia   . Type 2 diabetes mellitus with hyperglycemia (HCC)   . Cellulitis 01/03/2015  . Cellulitis of right lower extremity 01/03/2015  . Sepsis (HCC) 01/03/2015  . Hypertension   . GERD (gastroesophageal reflux disease)   . Essential hypertension   . Cellulitis of right leg   . Rotator cuff tear, right 09/08/2011   PCP:  Eber Jones, NP Pharmacy:   [facility] [address] [place] - [address] [place] [address] Phone: [phone] Fax: [phone]     Social Determinants of Health (SDOH) Interventions    Readmission Risk Interventions No flowsheet data found.

## 2020-07-28 NOTE — [place] FL2 (Signed)
[place] MEDICAID FL2 LEVEL OF CARE SCREENING TOOL     IDENTIFICATION  Patient Name: Justin Mason Birthdate: 1981-06-17 Sex: male Admission Date (Current Location): 07/24/2020  [facility] and [place] Number:  Producer, television/film/video and Address:  [facility], [address] [place], [place] [address]      Provider Number: [id number]  Attending Physician Name and Address:  Teddy Spike, DO  Relative Name and Phone Number:  Kydan Shanholtzer, [phone]    Current Level of Care: Hospital Recommended Level of Care: Skilled Nursing Facility Prior Approval Number:    Date Approved/Denied:   PASRR Number: [id number]  Discharge Plan: SNF    Current Diagnoses: Patient Active Problem List   Diagnosis Date Noted  . 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  . 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 Morbid obesity (HCC) 05/24/2020  . Cardiac murmur 05/24/2020  . Arthritis   . High cholesterol   . Hyperlipemia   . Type 2 diabetes mellitus with hyperglycemia (HCC)   . Cellulitis 01/03/2015  . Cellulitis of right lower extremity 01/03/2015  . Sepsis (HCC) 01/03/2015  . Hypertension   . GERD (gastroesophageal reflux disease)   . Essential hypertension   . Cellulitis of right leg   . Rotator cuff tear, right 09/08/2011    Orientation RESPIRATION BLADDER Height & Weight     Self  Normal Incontinent,External catheter Weight: (!) 366 lb 10 oz (166.3 kg) Height:     BEHAVIORAL SYMPTOMS/MOOD NEUROLOGICAL BOWEL NUTRITION STATUS      Incontinent Diet (See DC summary)  AMBULATORY STATUS COMMUNICATION OF NEEDS Skin   Total Care Verbally Surgical wounds (Surgical Incision R. Groin Anterior w/ gauze dressing)                       Personal Care Assistance Level of Assistance  Bathing,Feeding,Dressing Bathing Assistance: Maximum assistance Feeding assistance: Limited assistance Dressing Assistance: Maximum assistance     Functional Limitations  Info  Sight,Hearing,Speech Sight Info: Adequate Hearing Info: Adequate Speech Info: Impaired (Dysphagia)    SPECIAL CARE FACTORS FREQUENCY  PT (By licensed PT),OT (By licensed OT),Speech therapy     PT Frequency: 5x week OT Frequency: 5x week     Speech Therapy Frequency: 2x week      Contractures Contractures Info: Not present    Additional Factors Info  Code Status,Allergies,Psychotropic,Insulin Sliding Scale Code Status Info: Full Allergies Info: NKA Psychotropic Info: Zoloft Insulin Sliding Scale Info: Insulin Aspart (Novolog) 0-15 U every 4 hours       Current Medications (07/28/2020):  This is the current hospital active medication list Current Facility-Administered Medications  Medication Dose Route Frequency Provider Last Rate Last Admin  . 0.9 %  sodium chloride infusion   Intravenous Continuous Lynnell Catalan, MD 75 mL/hr at 07/28/20 0754 New Bag at 07/28/20 0754  . 0.9 %  sodium chloride infusion  250 mL Intravenous Continuous Agarwala, Daleen Bo, MD      . acetaminophen (TYLENOL) tablet 650 mg  650 mg Oral Q4H PRN Lynnell Catalan, MD       Or  . acetaminophen (TYLENOL) 160 MG/5ML solution 650 mg  650 mg Per Tube Q4H PRN Lynnell Catalan, MD       Or  . acetaminophen (TYLENOL) suppository 650 mg  650 mg Rectal Q4H PRN Lynnell Catalan, MD   650 mg at 07/26/20 0037  . allopurinol (ZYLOPRIM) tablet 100 mg  100 mg Oral Daily PRN Lynnell Catalan, MD      .  ascorbic acid (VITAMIN C) tablet 500 mg  500 mg Oral Daily Rejeana Brock, MD   500 mg at 07/28/20 0848  . aspirin suppository 300 mg  300 mg Rectal Daily Agarwala, Ravi, MD   300 mg at 07/26/20 1059   Or  . aspirin tablet 325 mg  325 mg Oral Daily Lynnell Catalan, MD   325 mg at 07/28/20 0848  . Chlorhexidine Gluconate Cloth 2 % PADS 6 each  6 each Topical Q0600 Lynnell Catalan, MD   6 each at 07/28/20 0551  . cholecalciferol (VITAMIN D3) tablet 1,000 Units  1,000 Units Oral QPM Lynnell Catalan, MD   1,000 Units at  07/27/20 1733  . cloNIDine (CATAPRES) tablet 0.2 mg  0.2 mg Oral BID Azucena Fallen, MD   0.2 mg at 07/28/20 0849  . feeding supplement (ENSURE ENLIVE / ENSURE PLUS) liquid 237 mL  237 mL Oral BID BM Rejeana Brock, MD   237 mL at 07/28/20 0850  . fentaNYL (SUBLIMAZE) injection 50 mcg  50 mcg Intravenous Q15 min PRN Agarwala, Daleen Bo, MD      . fentaNYL (SUBLIMAZE) injection 50-200 mcg  50-200 mcg Intravenous Q30 min PRN Lynnell Catalan, MD      . heparin injection 7,500 Units  7,500 Units Subcutaneous Q8H Lynnell Catalan, MD   7,500 Units at 07/28/20 0551  . hydrochlorothiazide (HYDRODIURIL) tablet 25 mg  25 mg Oral Daily Rejeana Brock, MD   25 mg at 07/28/20 0848  . insulin aspart (novoLOG) injection 0-15 Units  0-15 Units Subcutaneous Q4H Lynnell Catalan, MD   2 Units at 07/28/20 0747  . irbesartan (AVAPRO) tablet 300 mg  300 mg Oral Daily Rejeana Brock, MD   300 mg at 07/28/20 0849  . labetalol (NORMODYNE) injection 5 mg  5 mg Intravenous Q10 min PRN Lynnell Catalan, MD      . MEDLINE mouth rinse  15 mL Mouth Rinse BID Lynnell Catalan, MD   15 mL at 07/28/20 0850  . meloxicam (MOBIC) tablet 15 mg  15 mg Oral Daily Rejeana Brock, MD   15 mg at 07/28/20 0848  . metFORMIN (GLUCOPHAGE) tablet 1,000 mg  1,000 mg Oral BID WC Rejeana Brock, MD   1,000 mg at 07/28/20 1610  . metoprolol tartrate (LOPRESSOR) tablet 100 mg  100 mg Oral BID Lynnell Catalan, MD   100 mg at 07/28/20 0849  . multivitamin with minerals tablet 1 tablet  1 tablet Oral Daily Rejeana Brock, MD   1 tablet at 07/28/20 0849  . pantoprazole sodium (PROTONIX) 40 mg/20 mL oral suspension 40 mg  40 mg Oral Daily Rejeana Brock, MD   40 mg at 07/28/20 0849  . rosuvastatin (CRESTOR) tablet 10 mg  10 mg Oral Daily Lynnell Catalan, MD   10 mg at 07/28/20 0848  . sertraline (ZOLOFT) tablet 25 mg  25 mg Oral Daily Dagar, Anjali, MD   25 mg at 07/28/20 0848  . sodium chloride flush (NS)  0.9 % injection 3 mL  3 mL Intravenous Once Agarwala, Ravi, MD      . zinc sulfate capsule 220 mg  220 mg Oral QPM Lynnell Catalan, MD   220 mg at 07/27/20 1733     Discharge Medications: Please see discharge summary for a list of discharge medications.  Relevant Imaging Results:  Relevant Lab Results:   Additional Information SS# [id number]

## 2020-07-29 ENCOUNTER — Encounter (HOSPITAL_COMMUNITY): Payer: Self-pay

## 2020-07-29 DIAGNOSIS — I639 Cerebral infarction, unspecified: Secondary | ICD-10-CM | POA: Diagnosis not present

## 2020-07-29 LAB — CBC WITH DIFFERENTIAL/PLATELET
Abs Immature Granulocytes: 0.08 10*3/uL — ABNORMAL HIGH (ref 0.00–0.07)
Basophils Absolute: 0 10*3/uL (ref 0.0–0.1)
Basophils Relative: 0 %
Eosinophils Absolute: 0.1 10*3/uL (ref 0.0–0.5)
Eosinophils Relative: 1 %
HCT: 37.2 % — ABNORMAL LOW (ref 39.0–52.0)
Hemoglobin: 13.3 g/dL (ref 13.0–17.0)
Immature Granulocytes: 1 %
Lymphocytes Relative: 26 %
Lymphs Abs: 2.2 10*3/uL (ref 0.7–4.0)
MCH: 31.2 pg (ref 26.0–34.0)
MCHC: 35.8 g/dL (ref 30.0–36.0)
MCV: 87.3 fL (ref 80.0–100.0)
Monocytes Absolute: 0.8 10*3/uL (ref 0.1–1.0)
Monocytes Relative: 10 %
Neutro Abs: 5.4 10*3/uL (ref 1.7–7.7)
Neutrophils Relative %: 62 %
Platelets: 152 10*3/uL (ref 150–400)
RBC: 4.26 MIL/uL (ref 4.22–5.81)
RDW: 12.5 % (ref 11.5–15.5)
WBC: 8.7 10*3/uL (ref 4.0–10.5)
nRBC: 0 % (ref 0.0–0.2)

## 2020-07-29 LAB — COMPREHENSIVE METABOLIC PANEL
ALT: 46 U/L — ABNORMAL HIGH (ref 0–44)
AST: 26 U/L (ref 15–41)
Albumin: 3 g/dL — ABNORMAL LOW (ref 3.5–5.0)
Alkaline Phosphatase: 41 U/L (ref 38–126)
Anion gap: 10 (ref 5–15)
BUN: 14 mg/dL (ref 6–20)
CO2: 26 mmol/L (ref 22–32)
Calcium: 9 mg/dL (ref 8.9–10.3)
Chloride: 105 mmol/L (ref 98–111)
Creatinine, Ser: 0.97 mg/dL (ref 0.61–1.24)
GFR, Estimated: >60 mL/min (ref >60)
Glucose, Bld: 160 mg/dL — ABNORMAL HIGH (ref 70–99)
Potassium: 3.3 mmol/L — ABNORMAL LOW (ref 3.5–5.1)
Sodium: 141 mmol/L (ref 135–145)
Total Bilirubin: 1 mg/dL (ref 0.3–1.2)
Total Protein: 6.3 g/dL — ABNORMAL LOW (ref 6.5–8.1)

## 2020-07-29 LAB — GLUCOSE, CAPILLARY
Glucose-Capillary: 144 mg/dL — ABNORMAL HIGH (ref 70–99)
Glucose-Capillary: 148 mg/dL — ABNORMAL HIGH (ref 70–99)
Glucose-Capillary: 151 mg/dL — ABNORMAL HIGH (ref 70–99)
Glucose-Capillary: 175 mg/dL — ABNORMAL HIGH (ref 70–99)
Glucose-Capillary: 175 mg/dL — ABNORMAL HIGH (ref 70–99)
Glucose-Capillary: 184 mg/dL — ABNORMAL HIGH (ref 70–99)

## 2020-07-29 LAB — MAGNESIUM: Magnesium: 1.8 mg/dL (ref 1.7–2.4)

## 2020-07-29 MED ORDER — POTASSIUM CHLORIDE 20 MEQ PO PACK
40.0000 meq | PACK | Freq: Every day | ORAL | Status: DC
  Administered 2020-07-29 – 2020-08-09 (×12): 40 meq via ORAL
  Filled 2020-07-29 (×12): qty 2

## 2020-07-29 NOTE — Plan of Care (Signed)
  Problem: Education: Goal: Knowledge of General Education information will improve Description: Including pain rating scale, medication(s)/side effects and non-pharmacologic comfort measures Outcome: Progressing   Problem: Health Behavior/Discharge Planning: Goal: Ability to manage health-related needs will improve Outcome: Progressing   Problem: Clinical Measurements: Goal: Ability to maintain clinical measurements within normal limits will improve Outcome: Progressing Goal: Will remain free from infection Outcome: Progressing Goal: Diagnostic test results will improve Outcome: Progressing Goal: Respiratory complications will improve Outcome: Progressing Goal: Cardiovascular complication will be avoided Outcome: Progressing   Problem: Activity: Goal: Risk for activity intolerance will decrease Outcome: Progressing   Problem: Nutrition: Goal: Adequate nutrition will be maintained Outcome: Progressing   Problem: Coping: Goal: Level of anxiety will decrease Outcome: Progressing   Problem: Elimination: Goal: Will not experience complications related to bowel motility Outcome: Progressing Goal: Will not experience complications related to urinary retention Outcome: Progressing   Problem: Pain Managment: Goal: General experience of comfort will improve Outcome: Progressing   Problem: Safety: Goal: Ability to remain free from injury will improve Outcome: Progressing   Problem: Skin Integrity: Goal: Risk for impaired skin integrity will decrease Outcome: Progressing   Problem: Education: Goal: Knowledge of disease or condition will improve Outcome: Progressing Goal: Knowledge of secondary prevention will improve Outcome: Progressing Goal: Knowledge of patient specific risk factors addressed and post discharge goals established will improve Outcome: Progressing Goal: Individualized Educational Video(s) Outcome: Progressing   Problem: Coping: Goal: Will verbalize  positive feelings about self Outcome: Progressing Goal: Will identify appropriate support needs Outcome: Progressing   Problem: Health Behavior/Discharge Planning: Goal: Ability to manage health-related needs will improve Outcome: Progressing   Problem: Self-Care: Goal: Ability to participate in self-care as condition permits will improve Outcome: Progressing Goal: Verbalization of feelings and concerns over difficulty with self-care will improve Outcome: Progressing Goal: Ability to communicate needs accurately will improve Outcome: Progressing   Problem: Nutrition: Goal: Risk of aspiration will decrease Outcome: Progressing Goal: Dietary intake will improve Outcome: Progressing   Problem: Ischemic Stroke/TIA Tissue Perfusion: Goal: Complications of ischemic stroke/TIA will be minimized Outcome: Progressing   Problem: Safety: Goal: Non-violent Restraint(s) Outcome: Progressing   

## 2020-07-29 NOTE — Progress Notes (Signed)
Physical Therapy Treatment Patient Details Name: Justin Mason MRN: [id number] DOB: Aug 04, 1980 Today's Date: 07/29/2020    History of Present Illness 40 yo male with PMH of morbid obesity, DM2, HLD, HTN, known COVID-19 infection (dx less than 1 week PTA), history of shoulder surgery with residual R shoulder who presented to ED 1/19 with aphasia and RUE weakness beginning at about 1030 AM 07/24/2020 and progressing until 1230 same day with R facial droop. 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  Outside of window for tpa. Went to [facility] for revascularization but angioplasty of MCA unsuccessful due to resistance.    PT Comments    Today's skilled session continued to focus on mobility progression. Less overall assistance needed with bed mobility today with one person assist vs two person assist. Pt's right side remains very hemiparetic with mobility. PT discharge recommendations remain appropriate to maximize pt's functional gains prior to discharge home with family assistance.     Follow Up Recommendations  CIR;Supervision/Assistance - 24 hour     Equipment Recommendations  Other (comment) (TBD)    Precautions / Restrictions Precautions Precautions: Fall Precaution Comments: R inattention, R severe hemiparesis (near plegia) Restrictions Weight Bearing Restrictions: No    Mobility  Bed Mobility Overal bed mobility: Needs Assistance Bed Mobility: Supine to Sit;Sit to Supine     Supine to sit: Mod assist;+2 for safety/equipment;HOB elevated Sit to supine: Max assist;+2 for safety/equipment   General bed mobility comments: with HOB ~50 degrees pt able to transition from supine in bed>short sitting at edge of bed with using left UE to pull trunk up while holding PTA hand. then with left hand on bed surface min assist with pads under to to scoot to edge of bed fully. at end of session mod assist  for trunk control and right LE management onto bed to return to supine with HOB flat. with HOB down (bed in trendelenburg) had pt use left UE and LE along with 2 person assist at pads to scoot fully toward HOB.  Transfers Overall transfer level: Needs assistance   Transfers: Sit to/from Stand;Lateral/Scoot Transfers Sit to Stand: Mod assist;Max assist;+2 physical assistance;From elevated surface        Lateral/Scoot Transfers: Mod assist;+2 physical assistance General transfer comment: Stedy not avaiable at treatment time. worked on sit<>stand with left UE supported on sturdy surface, bed elevated and 2 person mod/max assist. right knee blocked to prevent buckling. unable to clear bed surface with 1st attempt. did clear bed for the next 2 attempts, however unable to achieve full standing with hip/trunk extension. Then worked on lateral scooting toward [facility] with mod assist of 2 with pads under pt used. cues on weight shifting, hand placement and general technique needed.   Modified Rankin (Stroke Patients Only) Modified Rankin (Stroke Patients Only) Pre-Morbid Rankin Score: No symptoms Modified Rankin: Severe disability     Cognition Arousal/Alertness: Awake/alert Behavior During Therapy: Flat affect;Impulsive (crying at times with session) Overall Cognitive Status: Impaired/Different from baseline Area of Impairment: Following commands;Safety/judgement;Awareness;Problem solving                       Following Commands: Follows one step commands with increased time;Follows one step commands inconsistently Safety/Judgement: Decreased awareness of safety;Decreased awareness of deficits Awareness: Emergent Problem Solving: Difficulty sequencing;Decreased initiation;Requires verbal cues;Requires tactile cues General Comments: Pt inconsistently follows mobility commands, can be impulsive  at times. Tearful during session, answering questions appropriately majority of time ( using head  nods). Required simple commands to sequence tasks.      Exercises General Exercises - Lower Extremity Ankle Circles/Pumps: AAROM;Strengthening;Right;10 reps;Supine;Limitations Ankle Circles/Pumps Limitations: with heel cord stretch at end range with each rep Long Arc Quad: AAROM;Right;Seated;10 reps Heel Slides: AAROM;Strengthening;Right;5 reps;Supine     Pertinent Vitals/Pain Pain Assessment: No/denies pain Pain Score: 0-No pain Faces Pain Scale: No hurt     PT Goals (current goals can now be found in the care plan section) Acute Rehab PT Goals Patient Stated Goal: get pt as independent as possible PT Goal Formulation: With patient Time For Goal Achievement: 08/09/20 Potential to Achieve Goals: Good Progress towards PT goals: Progressing toward goals    Frequency    Min 4X/week      PT Plan Current plan remains appropriate    AM-PAC PT "6 Clicks" Mobility   Outcome Measure  Help needed turning from your back to your side while in a flat bed without using bedrails?: A Lot Help needed moving from lying on your back to sitting on the side of a flat bed without using bedrails?: A Lot Help needed moving to and from a bed to a chair (including a wheelchair)?: Total Help needed standing up from a chair using your arms (e.g., wheelchair or bedside chair)?: A Lot Help needed to walk in hospital room?: Total Help needed climbing 3-5 steps with a railing? : Total 6 Click Score: 9    End of Session Equipment Utilized During Treatment: Gait belt Activity Tolerance: Patient tolerated treatment well Patient left: in bed;with call bell/phone within reach;with bed alarm set Nurse Communication: Mobility status;Need for lift equipment PT Visit Diagnosis: Hemiplegia and hemiparesis;Other abnormalities of gait and mobility (R26.89);Unsteadiness on feet (R26.81);Muscle weakness (generalized) (M62.81);Difficulty in walking, not elsewhere classified (R26.2);Other symptoms and signs  involving the nervous system (R29.898) Hemiplegia - Right/Left: Right Hemiplegia - dominant/non-dominant: Dominant Hemiplegia - caused by: Cerebral infarction     Time: 7106-2694 PT Time Calculation (min) (ACUTE ONLY): 28 min  Charges:  $Therapeutic Activity: 23-37 mins                    Sallyanne Kuster, PTA, CLT Acute Rehab Services Office- [phone] 07/29/20, 12:13 PM   Sallyanne Kuster 07/29/2020, 12:12 PM

## 2020-07-29 NOTE — Progress Notes (Signed)
  Speech Language Pathology Treatment: Dysphagia;Cognitive-Linquistic  Patient Details Name: Justin Mason MRN: [id number] DOB: 10-10-1980 Today's Date: 07/29/2020 Time: 1012-1030 SLP Time Calculation (min) (ACUTE ONLY): 18 min  Assessment / Plan / Recommendation Clinical Impression  Pt tolerated upgraded trials of regular texture with out pocketing and swift mastication and transit. No s/s aspiration with solids or thin - would benefit from observation with meal tray. Upgraded his texture to regular.  One spontaneous vocalization throughout session aimed at initiation of verbal expression through familiar songs, counting, unison with SL and melodic intonation. SLP wore N95 therefore pt unable to receive visual feedback for place of articulation from therapist (consider wearing CAPR). His comprehension has improved evidenced by 100% accuracy for one step command following. He identified correct word from field of 3 or 4 choices given semantic cues only (e. Point to the one we can drive). Continue tx on acute venue.    HPI HPI: Justin Mason is a 40 y.o. male with a history of hypertension, hyperlipidemia, diabetes, morbid obesity who presents with difficulty speaking. CT angiogram which revealed significant intracranial atherosclerosis with an occluded left M1.  CT perfusion was suggestive of a large area of hypoperfusion without core infarct on the left. CT small foci of hypodensity within the left caudate and left thalamus, may represent small infarcts, age indeterminate. Per MD note, at the time of angiogram it was discovered that the blockage was likely chronic and therefore no attempt at reopening it was performed.      SLP Plan  Continue with current plan of care       Recommendations  Diet recommendations: Regular;Thin liquid Liquids provided via: Cup;Straw Medication Administration: Crushed with puree Supervision: Staff to assist with self feeding;Full supervision/cueing for  compensatory strategies;Patient able to self feed Compensations: Slow rate;Minimize environmental distractions;Small sips/bites;Lingual sweep for clearance of pocketing Postural Changes and/or Swallow Maneuvers: Seated upright 90 degrees                Oral Care Recommendations: Oral care BID Follow up Recommendations: Inpatient Rehab SLP Visit Diagnosis: Dysphagia, unspecified (R13.10) Plan: Continue with current plan of care                       Justin Mason 07/29/2020, 11:05 AM  Justin Mason Justin Mason Nurse, children's [phone] Office [phone]

## 2020-07-29 NOTE — Progress Notes (Signed)
Inpatient Rehabilitation-Admissions Coordinator   Inpatient rehab consult./prescreen received. Noted COVID + 07/24/20. Patients are eligible to be considered for admit to the [facility] when cleared from airborne precautions by acute MD or Infectious disease regardless of onset day. Otherwise they will need to be >20 days from their positive test with recovery/improvement in symptoms (08/19/20) or 2 negative tests. As pt has not yet met the above criteria, AC will sign off at this time. Please contact me if this changes.   Raechel Ache, OTR/L  Rehab Admissions Coordinator  [phone] 07/29/2020 1:13 PM

## 2020-07-29 NOTE — Progress Notes (Signed)
PROGRESS NOTE    Justin Mason  JOA:[id number] DOB: 09/10/1980 DOA: 07/24/2020 PCP: Eber Jones, NP   Brief Narrative:   40 yo M PMH morbid obesity, DM2, HLD, HTN, known COVID-19 infection (dx less than 1 week PTA) who presented to ED 1/19 with aphasia and RUE weakness beginning at about 1030 AM 07/24/2020 and progressing until 1230 same day with R facial droop. CT H with concern for L sided infarcts CTA with short segment occlusion of R M1/MCA, L ICA occlusion, proximal L M1, stenosis of PCAs. Outside of window for tpa. Went to [facility] rev[facility]ization but angioplasty of MCA unsuccessful due to resistance.  1/24: Seen working with SLP this morning. No acute events ON. He is stable.   Assessment & Plan: L ICA occlusion, severe R MCA stenosis     - presenting with right side weakness and aphasia     - Unsuccessful revascularization due to chronicity     - Cause of decompensation of chronic disease unclear - possible hypoglycemic episode.      - Neuro eval'd: DAPT for 3 months then transition to single agent     - continue statin     - SLP Diet Recommendations: Dysphagia 3 (Mech soft) solids;Thin liquid         - Liquid Administration via Cup;Straw         - Medication Administration Crushed with puree         - Compensations: Slow rate; Minimize environmental distractions; Small sips/bites; Lingual sweep for clearance of pocketing         - Postural Changes: Seated upright at 90 degrees     - 1/24: He is stable, continue working with PT/SLP; awaiting placement  Incidental covid positive Acute respiratory failure post procedure     - hypoxia due to procedure; resolved     - no current indication for COVID treatment     - 1/24: remains stable on RA, follow  Hypertension.      - completed permissive HTN window     - HCTZ, irbesartan, clonidine, metoprolol     - 1/24: BP looks good today, continue current regimen  Hypokalemia     - Mg2+ ok. Add K+. Follow.  DVT  prophylaxis: heparin Code Status: FULL Family Communication: w/ mother by phone    Status is: Inpatient  Remains inpatient appropriate because:Unsafe d/c plan   Dispo: The patient is from: Home              Anticipated d/c is to: SNF              Anticipated d/c date is: 3 days              Patient currently is not medically stable to d/c.   Difficult to place patient Yes  Consultants:   Neurology  Procedures:   Attempted angioplasty of MCA, failed 07/24/20  Subjective: No complaints this AM. Working with SLP  Objective: Vitals:   07/29/20 0019 07/29/20 0514 07/29/20 0812 07/29/20 1136  BP: (!) 146/90 131/87 (!) 154/89 139/63  Pulse: (!) 58 (!) 52 70 63  Resp: 16 18 18 19   Temp: 98.5 F (36.9 C) 98.8 F (37.1 C) 98.5 F (36.9 C) 98.4 F (36.9 C)  TempSrc: Oral Oral Oral Oral  SpO2: 96% 93% 97% 96%  Weight:        Intake/Output Summary (Last 24 hours) at 07/29/2020 1208 Last data filed at 07/29/2020 1137 Gross per 24 hour  Intake  1982.13 ml  Output 1200 ml  Net 782.13 ml   Filed Weights   07/24/20 1615 07/25/20 0604 07/26/20 1540  Weight: (!) 172.8 kg (!) 172.6 kg (!) 166.3 kg    Examination:  General: 40 y.o. male resting in bed in NAD Eyes: PERRL, normal sclera ENMT: Nares patent w/o discharge, orophaynx clear, dentition normal, ears w/o discharge/lesions/ulcers Neck: Supple, trachea midline Cardiovascular: RRR, +S1, S2, no m/g/r, equal pulses throughout Respiratory: CTABL, no w/r/r, normal WOB GI: BS+, NDNT, no masses noted, no organomegaly noted MSK: No e/c/c Neuro: Alert, tracking, following commands, RUE/RLE weakness Psyc: tearful, but calm/cooperative   Data Reviewed: I have personally reviewed following labs and imaging studies.  CBC: Recent Labs  Lab 07/24/20 1546 07/24/20 1554 07/25/20 0515 07/29/20 0302  WBC 6.4  --  4.2 8.7  NEUTROABS 3.1  --  2.9 5.4  HGB 18.0* 17.3* 15.1 13.3  HCT 51.0 51.0 43.1 37.2*  MCV 87.5  --  87.2 87.3   PLT 201  --  173 152   Basic Metabolic Panel: Recent Labs  Lab 07/24/20 1546 07/24/20 1554 07/25/20 0515 07/29/20 0302  NA 141 142 139 141  K 3.6 3.7 4.1 3.3*  CL 102 103 103 105  CO2 24  --  23 26  GLUCOSE 164* 158* 165* 160*  BUN 18 22* 17 14  CREATININE 1.10 1.00 1.05 0.97  CALCIUM 10.0  --  9.0 9.0  MG  --   --   --  1.8   GFR: Estimated Creatinine Clearance: 165.6 mL/min (by C-G formula based on SCr of 0.97 mg/dL). Liver Function Tests: Recent Labs  Lab 07/24/20 1546 07/29/20 0302  AST 51* 26  ALT 84* 46*  ALKPHOS 65 41  BILITOT 0.8 1.0  PROT 7.9 6.3*  ALBUMIN 4.1 3.0*   No results for input(s): LIPASE, AMYLASE in the last 168 hours. No results for input(s): AMMONIA in the last 168 hours. Coagulation Profile: Recent Labs  Lab 07/24/20 1546  INR 1.0   Cardiac Enzymes: No results for input(s): CKTOTAL, CKMB, CKMBINDEX, TROPONINI in the last 168 hours. BNP (last 3 results) No results for input(s): PROBNP in the last 8760 hours. HbA1C: No results for input(s): HGBA1C in the last 72 hours. CBG: Recent Labs  Lab 07/28/20 2110 07/29/20 0019 07/29/20 0607 07/29/20 0821 07/29/20 1134  GLUCAP 128* 148* 151* 175* 184*   Lipid Profile: No results for input(s): CHOL, HDL, LDLCALC, TRIG, CHOLHDL, LDLDIRECT in the last 72 hours. Thyroid Function Tests: No results for input(s): TSH, T4TOTAL, FREET4, T3FREE, THYROIDAB in the last 72 hours. Anemia Panel: No results for input(s): VITAMINB12, FOLATE, FERRITIN, TIBC, IRON, RETICCTPCT in the last 72 hours. Sepsis Labs: Recent Labs  Lab 07/24/20 1600 07/24/20 1604 07/25/20 0515  PROCALCITON <0.10  --   --   LATICACIDVEN  --  2.9* 1.6    Recent Results (from the past 240 hour(s))  MRSA PCR Screening     Status: None   Collection Time: 07/25/20  2:14 AM   Specimen: Nasal Mucosa; Nasopharyngeal  Result Value Ref Range Status   MRSA by PCR NEGATIVE NEGATIVE Final    Comment:        The GeneXpert MRSA Assay  (FDA approved for NASAL specimens only), is one component of a comprehensive MRSA colonization surveillance program. It is not intended to diagnose MRSA infection nor to guide or monitor treatment for MRSA infections. Performed at [facility], [address], [place], [place] [address]  Blood Culture (routine x 2)     Status: None (Preliminary result)   Collection Time: 07/25/20  5:15 AM   Specimen: BLOOD RIGHT HAND  Result Value Ref Range Status   Specimen Description BLOOD RIGHT HAND  Final   Special Requests   Final    BOTTLES DRAWN AEROBIC ONLY Blood Culture adequate volume   Culture   Final    NO GROWTH 3 DAYS Performed at [facility], [address], [place], [place] [address]    Report Status PENDING  Incomplete  Blood Culture (routine x 2)     Status: None (Preliminary result)   Collection Time: 07/25/20  5:15 AM   Specimen: BLOOD RIGHT HAND  Result Value Ref Range Status   Specimen Description BLOOD RIGHT HAND  Final   Special Requests   Final    BOTTLES DRAWN AEROBIC ONLY Blood Culture adequate volume   Culture   Final    NO GROWTH 3 DAYS Performed at [facility], [address] [place], [place] [address]    Report Status PENDING  Incomplete      Radiology Studies: No results found.   Scheduled Meds: . vitamin C  500 mg Oral Daily  . aspirin  300 mg Rectal Daily   Or  . aspirin  325 mg Oral Daily  . Chlorhexidine Gluconate Cloth  6 each Topical Q0600  . cholecalciferol  1,000 Units Oral QPM  . cloNIDine  0.2 mg Oral BID  . feeding supplement  237 mL Oral BID BM  . heparin injection (subcutaneous)  7,500 Units Subcutaneous Q8H  . hydrochlorothiazide  25 mg Oral Daily  . insulin aspart  0-15 Units Subcutaneous Q4H  . irbesartan  300 mg Oral Daily  . mouth rinse  15 mL Mouth Rinse BID  . meloxicam  15 mg Oral Daily  . metFORMIN  1,000 mg Oral BID WC  . metoprolol tartrate  100 mg Oral BID  . multivitamin with minerals  1 tablet  Oral Daily  . pantoprazole sodium  40 mg Oral Daily  . rosuvastatin  10 mg Oral Daily  . sertraline  25 mg Oral Daily  . sodium chloride flush  3 mL Intravenous Once  . zinc sulfate  220 mg Oral QPM   Continuous Infusions: . sodium chloride       LOS: 5 days    Time spent: 25 minutes spent in the coordination of care today.    Teddy Spike, DO [facility]  If 7PM-7AM, please contact night-coverage [email or web address] 07/29/2020, 12:08 PM

## 2020-07-30 DIAGNOSIS — I639 Cerebral infarction, unspecified: Secondary | ICD-10-CM | POA: Diagnosis not present

## 2020-07-30 LAB — GLUCOSE, CAPILLARY
Glucose-Capillary: 144 mg/dL — ABNORMAL HIGH (ref 70–99)
Glucose-Capillary: 147 mg/dL — ABNORMAL HIGH (ref 70–99)
Glucose-Capillary: 153 mg/dL — ABNORMAL HIGH (ref 70–99)
Glucose-Capillary: 162 mg/dL — ABNORMAL HIGH (ref 70–99)
Glucose-Capillary: 176 mg/dL — ABNORMAL HIGH (ref 70–99)
Glucose-Capillary: 184 mg/dL — ABNORMAL HIGH (ref 70–99)
Glucose-Capillary: 185 mg/dL — ABNORMAL HIGH (ref 70–99)

## 2020-07-30 LAB — MAGNESIUM: Magnesium: 1.9 mg/dL (ref 1.7–2.4)

## 2020-07-30 LAB — CULTURE, BLOOD (ROUTINE X 2)
Culture: NO GROWTH
Culture: NO GROWTH
Special Requests: ADEQUATE
Special Requests: ADEQUATE

## 2020-07-30 LAB — COMPREHENSIVE METABOLIC PANEL
ALT: 51 U/L — ABNORMAL HIGH (ref 0–44)
AST: 29 U/L (ref 15–41)
Albumin: 2.9 g/dL — ABNORMAL LOW (ref 3.5–5.0)
Alkaline Phosphatase: 40 U/L (ref 38–126)
Anion gap: 11 (ref 5–15)
BUN: 16 mg/dL (ref 6–20)
CO2: 26 mmol/L (ref 22–32)
Calcium: 9.2 mg/dL (ref 8.9–10.3)
Chloride: 100 mmol/L (ref 98–111)
Creatinine, Ser: 0.96 mg/dL (ref 0.61–1.24)
GFR, Estimated: >60 mL/min (ref >60)
Glucose, Bld: 169 mg/dL — ABNORMAL HIGH (ref 70–99)
Potassium: 3.6 mmol/L (ref 3.5–5.1)
Sodium: 137 mmol/L (ref 135–145)
Total Bilirubin: 1 mg/dL (ref 0.3–1.2)
Total Protein: 6.3 g/dL — ABNORMAL LOW (ref 6.5–8.1)

## 2020-07-30 NOTE — Progress Notes (Signed)
PROGRESS NOTE    Justin Mason  IZT:[id number] DOB: [birth year] DOA: 07/24/2020 PCP: Eber Jones, NP   Brief Narrative:   40 yo M PMH morbid obesity, DM2, HLD, HTN, known COVID-19 infection (dx less than 1 week PTA) who presented to ED 1/19 with aphasia and RUE weakness beginning at about 1030 AM 07/24/2020 and progressing until 1230 same day with R facial droop. CT H with concern for L sided infarcts CTA with short segment occlusion of R M1/MCA, L ICA occlusion, proximal L M1, stenosis of PCAs. Outside of window for tpa. Went to [facility] but angioplasty of MCA unsuccessful due to resistance.  1/25: No acute event ON. Working on rehab options. Continue current regimen.  Assessment & Plan: L ICA occlusion, severe R MCA stenosis - presenting with right side weakness and aphasia - Unsuccessful revascularization due to chronicity - Cause of decompensation of chronic disease unclear - possible hypoglycemic episode.  - Neuro eval'd: DAPT for 3 months then transition to single agent - continue statin - SLP Diet Recommendations:Dysphagia 3 (Mech soft) solids;Thin liquid - Liquid Administration via Cup;Straw - Medication Administration Crushed with puree - Compensations:Slow rate; Minimize environmental distractions; Small sips/bites; Lingual sweep for clearance of pocketing - Postural Changes:Seated upright at 90 degrees     - 1/25: continuing to work with rehab services; still working on placement.  Incidental covid positive Acute respiratory failure post procedure - hypoxia due to procedure; resolved - no current indication for COVID treatment     - 1/25: stable on RA  Hypertension.  - completed permissive HTN window - HCTZ, irbesartan, clonidine, metoprolol     - 1/25: BP looks good, no changes to current regimen  Hypokalemia     - K+/Mg2+ ok, follow  DVT prophylaxis:  heparin Code Status: FULL Family Communication: None at bedside.   Status is: Inpatient  Remains inpatient appropriate because:Inpatient level of care appropriate due to severity of illness   Dispo: The patient is from: Home              Anticipated d/c is to: SNF              Anticipated d/c date is: > 3 days              Patient currently is not medically stable to d/c.   Difficult to place patient No  Consultants:   Neurology   Subjective: No acute events ON  Objective: Vitals:   07/29/20 2120 07/29/20 2357 07/30/20 0350 07/30/20 0920  BP: (!) 156/94 (!) 131/96 136/79 136/71  Pulse: 65 82 (!) 59 62  Resp: 18 20 16 19   Temp: 98.5 F (36.9 C) 98.7 F (37.1 C) 98.9 F (37.2 C) 98.4 F (36.9 C)  TempSrc: Oral Oral Axillary Oral  SpO2: 98% 100% 100% 96%  Weight:        Intake/Output Summary (Last 24 hours) at 07/30/2020 1219 Last data filed at 07/29/2020 2100 Gross per 24 hour  Intake -  Output 1050 ml  Net -1050 ml   Filed Weights   07/24/20 1615 07/25/20 0604 07/26/20 1540  Weight: (!) 172.8 kg (!) 172.6 kg (!) 166.3 kg    Examination:  General: 40 y.o. male resting in bed in NAD Eyes: PERRL, normal sclera ENMT: Nares patent w/o discharge, orophaynx clear, dentition normal, ears w/o discharge/lesions/ulcers Neck: Supple, trachea midline Cardiovascular: RRR, +S1, S2, no m/g/r, equal pulses throughout Respiratory: CTABL, no w/r/r, normal WOB GI: BS+, NDNT, no masses noted,  no organomegaly noted MSK: No e/c/c Neuro: Alert, following commands, RUE/RLE weakness  Data Reviewed: I have personally reviewed following labs and imaging studies.  CBC: Recent Labs  Lab 07/24/20 1546 07/24/20 1554 07/25/20 0515 07/29/20 0302  WBC 6.4  --  4.2 8.7  NEUTROABS 3.1  --  2.9 5.4  HGB 18.0* 17.3* 15.1 13.3  HCT 51.0 51.0 43.1 37.2*  MCV 87.5  --  87.2 87.3  PLT 201  --  173 152   Basic Metabolic Panel: Recent Labs  Lab 07/24/20 1546 07/24/20 1554  07/25/20 0515 07/29/20 0302 07/30/20 0450  NA 141 142 139 141 137  K 3.6 3.7 4.1 3.3* 3.6  CL 102 103 103 105 100  CO2 24  --  23 26 26   GLUCOSE 164* 158* 165* 160* 169*  BUN 18 22* 17 14 16   CREATININE 1.10 1.00 1.05 0.97 0.96  CALCIUM 10.0  --  9.0 9.0 9.2  MG  --   --   --  1.8 1.9   GFR: Estimated Creatinine Clearance: 167.3 mL/min (by C-G formula based on SCr of 0.96 mg/dL). Liver Function Tests: Recent Labs  Lab 07/24/20 1546 07/29/20 0302 07/30/20 0450  AST 51* 26 29  ALT 84* 46* 51*  ALKPHOS 65 41 40  BILITOT 0.8 1.0 1.0  PROT 7.9 6.3* 6.3*  ALBUMIN 4.1 3.0* 2.9*   No results for input(s): LIPASE, AMYLASE in the last 168 hours. No results for input(s): AMMONIA in the last 168 hours. Coagulation Profile: Recent Labs  Lab 07/24/20 1546  INR 1.0   Cardiac Enzymes: No results for input(s): CKTOTAL, CKMB, CKMBINDEX, TROPONINI in the last 168 hours. BNP (last 3 results) No results for input(s): PROBNP in the last 8760 hours. HbA1C: No results for input(s): HGBA1C in the last 72 hours. CBG: Recent Labs  Lab 07/29/20 1733 07/29/20 2120 07/29/20 2358 07/30/20 0349 07/30/20 0938  GLUCAP 175* 144* 153* 144* 162*   Lipid Profile: No results for input(s): CHOL, HDL, LDLCALC, TRIG, CHOLHDL, LDLDIRECT in the last 72 hours. Thyroid Function Tests: No results for input(s): TSH, T4TOTAL, FREET4, T3FREE, THYROIDAB in the last 72 hours. Anemia Panel: No results for input(s): VITAMINB12, FOLATE, FERRITIN, TIBC, IRON, RETICCTPCT in the last 72 hours. Sepsis Labs: Recent Labs  Lab 07/24/20 1600 07/24/20 1604 07/25/20 0515  PROCALCITON <0.10  --   --   LATICACIDVEN  --  2.9* 1.6    Recent Results (from the past 240 hour(s))  MRSA PCR Screening     Status: None   Collection Time: 07/25/20  2:14 AM   Specimen: Nasal Mucosa; Nasopharyngeal  Result Value Ref Range Status   MRSA by PCR NEGATIVE NEGATIVE Final    Comment:        The GeneXpert MRSA Assay  (FDA approved for NASAL specimens only), is one component of a comprehensive MRSA colonization surveillance program. It is not intended to diagnose MRSA infection nor to guide or monitor treatment for MRSA infections. Performed at [facility], [address], [place], [address] [place]   Blood Culture (routine x 2)     Status: None (Preliminary result)   Collection Time: 07/25/20  5:15 AM   Specimen: BLOOD RIGHT HAND  Result Value Ref Range Status   Specimen Description BLOOD RIGHT HAND  Final   Special Requests   Final    BOTTLES DRAWN AEROBIC ONLY Blood Culture adequate volume   Culture   Final    NO GROWTH 4 DAYS Performed  at [facility], [address], [place], [place] [address]    Report Status PENDING  Incomplete  Blood Culture (routine x 2)     Status: None (Preliminary result)   Collection Time: 07/25/20  5:15 AM   Specimen: BLOOD RIGHT HAND  Result Value Ref Range Status   Specimen Description BLOOD RIGHT HAND  Final   Special Requests   Final    BOTTLES DRAWN AEROBIC ONLY Blood Culture adequate volume   Culture   Final    NO GROWTH 4 DAYS Performed at [facility], [address], [place], [place] [address]    Report Status PENDING  Incomplete      Radiology Studies: No results found.   Scheduled Meds: . vitamin C  500 mg Oral Daily  . aspirin  300 mg Rectal Daily   Or  . aspirin  325 mg Oral Daily  . Chlorhexidine Gluconate Cloth  6 each Topical Q0600  . cholecalciferol  1,000 Units Oral QPM  . cloNIDine  0.2 mg Oral BID  . feeding supplement  237 mL Oral BID BM  . heparin injection (subcutaneous)  7,500 Units Subcutaneous Q8H  . hydrochlorothiazide  25 mg Oral Daily  . insulin aspart  0-15 Units Subcutaneous Q4H  . irbesartan  300 mg Oral Daily  . mouth rinse  15 mL Mouth Rinse BID  . meloxicam  15 mg Oral Daily  . metFORMIN  1,000 mg Oral BID WC  . metoprolol tartrate  100 mg Oral BID  . multivitamin with minerals  1 tablet  Oral Daily  . pantoprazole sodium  40 mg Oral Daily  . potassium chloride  40 mEq Oral Daily  . rosuvastatin  10 mg Oral Daily  . sertraline  25 mg Oral Daily  . sodium chloride flush  3 mL Intravenous Once  . zinc sulfate  220 mg Oral QPM   Continuous Infusions: . sodium chloride       LOS: 6 days    Time spent: 25 minutes spent in the coordination of care today.    Teddy Spike, DO [facility]  If 7PM-7AM, please contact night-coverage [email or web address] 07/30/2020, 12:19 PM

## 2020-07-30 NOTE — Progress Notes (Signed)
Physical Therapy Treatment Patient Details Name: HUBER MATHERS MRN: [id number] DOB: Feb 28, 1981 Today's Date: 07/30/2020    History of Present Illness 40 yo male with PMH of morbid obesity, DM2, HLD, HTN, known COVID-19 infection (dx less than 1 week PTA), history of shoulder surgery with residual R shoulder who presented to ED 1/19 with aphasia and RUE weakness beginning at about 1030 AM 07/24/2020 and progressing until 1230 same day with R facial droop. 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  Outside of window for tpa. Went to [facility] for revascularization but angioplasty of MCA unsuccessful due to resistance.    PT Comments    Pt motivated to get into chair. Pt nodded understanding for exercises/activities to promote carryover. Pt continues to demonstrate dense hemiplegia on R with decreased attention to R. Pt fatigued quickly when attempted mulitple sit<>stand for stedy. Pt demonstrating deficits in balance, strength, coordination, endurance and safety and will benefit from skilled PT to address deficits ot maximize independence with functional mobility prior to discharge     Follow Up Recommendations  CIR;Supervision/Assistance - 24 hour     Equipment Recommendations  Other (comment) (TBD)    Recommendations for Other Services       Precautions / Restrictions Precautions Precautions: Fall Precaution Comments: R inattention, R severe hemiparesis (near plegia) Restrictions Weight Bearing Restrictions: No    Mobility  Bed Mobility Overal bed mobility: Needs Assistance Bed Mobility: Supine to Sit     Supine to sit: Mod assist;HOB elevated        Transfers Overall transfer level: Needs assistance Equipment used: Ambulation equipment used Transfers: Sit to/from Stand Sit to Stand: Max assist;+2 physical assistance;+2 safety/equipment         General transfer comment: performed  wtih mod A initially, pt with decreased hip extension on R and uanble to complete hip extension wtihout max +2 to move pads from stedy. Pt attempted mini sit to stand multiple times with therapist providing support at RLE and pt pulling with LUE and LE to attempt to complete sit<>stand transfer.  Ambulation/Gait                 Stairs             Wheelchair Mobility    Modified Rankin (Stroke Patients Only) Modified Rankin (Stroke Patients Only) Pre-Morbid Rankin Score: No symptoms Modified Rankin: Severe disability     Balance                                            Cognition Arousal/Alertness: Awake/alert Behavior During Therapy: Flat affect;Impulsive Overall Cognitive Status: Impaired/Different from baseline Area of Impairment: Following commands;Safety/judgement;Awareness;Problem solving                         Safety/Judgement: Decreased awareness of safety;Decreased awareness of deficits     General Comments: Pt inconsistently follows mobility commands, can be impulsive at times. Tearful during session, answering questions appropriately majority of time ( using head nods). Required simple commands to sequence tasks.      Exercises      General Comments General comments (skin integrity, edema, etc.): education to perform B simultaneous glute and quad sets as well as LAQ and abduction while in recliner to promote crossover  muscle activation. education to perform hand over hand elbow flexion/extension      Pertinent Vitals/Pain Pain Assessment: No/denies pain    Home Living                      Prior Function            PT Goals (current goals can now be found in the care plan section) Acute Rehab PT Goals Patient Stated Goal: get pt as independent as possible PT Goal Formulation: With patient Time For Goal Achievement: 08/09/20 Potential to Achieve Goals: Good Progress towards PT goals: Progressing toward  goals    Frequency    Min 4X/week      PT Plan Current plan remains appropriate    Co-evaluation              AM-PAC PT "6 Clicks" Mobility   Outcome Measure  Help needed turning from your back to your side while in a flat bed without using bedrails?: A Lot Help needed moving from lying on your back to sitting on the side of a flat bed without using bedrails?: A Lot Help needed moving to and from a bed to a chair (including a wheelchair)?: Total Help needed standing up from a chair using your arms (e.g., wheelchair or bedside chair)?: A Lot Help needed to walk in hospital room?: Total Help needed climbing 3-5 steps with a railing? : Total 6 Click Score: 9    End of Session Equipment Utilized During Treatment: Gait belt Activity Tolerance: Patient tolerated treatment well Patient left: in chair;with chair alarm set;with call bell/phone within reach Nurse Communication: Mobility status;Need for lift equipment PT Visit Diagnosis: Hemiplegia and hemiparesis;Other abnormalities of gait and mobility (R26.89);Unsteadiness on feet (R26.81);Muscle weakness (generalized) (M62.81);Difficulty in walking, not elsewhere classified (R26.2);Other symptoms and signs involving the nervous system (R29.898) Hemiplegia - Right/Left: Right Hemiplegia - dominant/non-dominant: Dominant Hemiplegia - caused by: Cerebral infarction     Time: 1230-1302 PT Time Calculation (min) (ACUTE ONLY): 32 min  Charges:  $Therapeutic Activity: 8-22 mins $Neuromuscular Re-education: 8-22 mins                     Ginette Otto, DPT Acute Rehabilitation Services [phone]   Lucretia Field 07/30/2020, 1:53 PM

## 2020-07-31 DIAGNOSIS — I639 Cerebral infarction, unspecified: Secondary | ICD-10-CM | POA: Diagnosis not present

## 2020-07-31 LAB — GLUCOSE, CAPILLARY
Glucose-Capillary: 169 mg/dL — ABNORMAL HIGH (ref 70–99)
Glucose-Capillary: 151 mg/dL — ABNORMAL HIGH (ref 70–99)
Glucose-Capillary: 213 mg/dL — ABNORMAL HIGH (ref 70–99)
Glucose-Capillary: 153 mg/dL — ABNORMAL HIGH (ref 70–99)
Glucose-Capillary: 141 mg/dL — ABNORMAL HIGH (ref 70–99)
Glucose-Capillary: 153 mg/dL — ABNORMAL HIGH (ref 70–99)

## 2020-07-31 MED ORDER — STROKE: EARLY STAGES OF RECOVERY BOOK
Status: AC
  Filled 2020-07-31: qty 1

## 2020-07-31 NOTE — Progress Notes (Addendum)
°  Speech Language Pathology Treatment: Cognitive-Linquistic  Patient Details Name: Justin Mason MRN: [id number] DOB: 01-12-1981 Today's Date: 07/31/2020 Time: 9935-7017 SLP Time Calculation (min) (ACUTE ONLY): 29 min  Assessment / Plan / Recommendation Clinical Impression  Justin Mason was pleasant and participatory in treatment targeting motor speech and aphasia becoming labile once when girlfriend's name mentioned. His comprehension for basic information has improved. He followed commands with 100% accuracy for one step. He demonstrated appropriate gestures to convey hypothetical situations (show me how you tell the nurse you are thirsty etc.).  SLP wore CAPR so pt able to visualize therapist's lips, tongue and facial movements. He approximated place of articulation for isolated phonemes  /w/, /e/, /o/, /ah/, /n/, /m/, /v/ with mild movements. No spontaneous vocalizations noted today and attempts at vocalization initiation included watching/listening to videos and therapists oral movement during familiar songs. Lips moved slightly but no appreciable oral approximations noted and voice not activated. No response during more automatic vocalization tasks including yawn, throat clears. Continue therapy for improvement of communicative abilities.  Pt eating meal as therapist arrived and noted right cheek/oral cavity full of food. Pt actively masticating on arrival. Facilitated clearance and provided cup for oral wash that was successful in clearing. Continue regular/thin with supervision to check and remove right buccal cavity pocketing.    HPI HPI: Justin Mason is a 40 y.o. male with a history of hypertension, hyperlipidemia, diabetes, morbid obesity who presents with difficulty speaking. CT angiogram which revealed significant intracranial atherosclerosis with an occluded left M1.  CT perfusion was suggestive of a large area of hypoperfusion without core infarct on the left. CT small foci of hypodensity within  the left caudate and left thalamus, may represent small infarcts, age indeterminate. Per MD note, at the time of angiogram it was discovered that the blockage was likely chronic and therefore no attempt at reopening it was performed.      SLP Plan  Continue with current plan of care       Recommendations                   General recommendations: Rehab consult Oral Care Recommendations: Oral care BID Follow up Recommendations: Inpatient Rehab SLP Visit Diagnosis: Apraxia (R48.2);Aphasia (R47.01) Plan: Continue with current plan of care       GO                [name] 07/31/2020, 4:01 PM Breck Coons Lonell Face.Ed Nurse, children's [phone] Office [phone]

## 2020-07-31 NOTE — Progress Notes (Signed)
PROGRESS NOTE    Justin Mason  JME:[id number] DOB: Sep 30, 1980 DOA: 07/24/2020 PCP: Eber Jones, NP   Brief Narrative:   40 yo M PMH morbid obesity, DM2, HLD, HTN, known COVID-19 infection (dx less than 1 week PTA) who presented to ED 1/19 with aphasia and RUE weakness beginning at about 1030 AM 07/24/2020 and progressing until 1230 same day with R facial droop. CT H with concern for L sided infarcts CTA with short segment occlusion of R M1/MCA, L ICA occlusion, proximal L M1, stenosis of PCAs. Outside of window for tpa. Went to [facility] rev[facility] but angioplasty of MCA unsuccessful due to resistance.  1/25: No acute event ON. Working on rehab options. Continue current regimen.  Assessment & Plan:  L ICA occlusion, severe R MCA stenosis - Presenting with right side weakness and aphasia - Unsuccessful revascularization due to chronicity - Cause of decompensation of chronic disease unclear - possible hypoglycemic episode.  - Neuro eval'd: DAPT for 3 months then transition to single agent - Continue statin - SLP Diet Recommendations:Dysphagia 3 (Mech soft) solids;Thin liquid - Liquid Administration via Cup;Straw - Medication Administration Crushed with puree - Compensations:Slow rate; Minimize environmental distractions; Small sips/bites; Lingual sweep for clearance of pocketing - Postural Changes:Seated upright at 90 degrees - 1/25: continuing to work with rehab services; still working on placement.  Incidental covid positive - Acute respiratory failure post procedure - Hypoxia due to procedure; resolved - No current indication for COVID treatment should come off quarantine 08/03/20 - 1/25: Stable on RA  Hypertension.  - Completed permissive HTN window - HCTZ, irbesartan, clonidine, metoprolol - 1/25: BP looks good, no changes to current regimen  Hypokalemia - K+/Mg2+ ok, follow  DVT prophylaxis: Heparin Code Status: FULL Family Communication: None at  bedside Status is: Inpatient  Remains inpatient appropriate because:Inpatient level of care appropriate due to severity of illness  Dispo: The patient is from: Home             Anticipated d/c is to: SNF vs CIR             Anticipated d/c date is: > 72h             Patient currently is not medically stable to d/c.  Difficult to place patient No  Consultants:   Neurology  Subjective: No acute events overnight  Objective: Vitals:   07/30/20 1655 07/30/20 2001 07/30/20 2357 07/31/20 0339  BP: 128/79 131/87 126/62 (!) 137/49  Pulse:  65 61 61  Resp:  15 16 17   Temp: 98.4 F (36.9 C) 98.3 F (36.8 C) 98 F (36.7 C) 98.3 F (36.8 C)  TempSrc: Oral Oral Oral Oral  SpO2:  94% 96% 98%  Weight:        Intake/Output Summary (Last 24 hours) at 07/31/2020 08/02/2020 Last data filed at 07/31/2020 0340 Gross per 24 hour  Intake 1060 ml  Output 1750 ml  Net -690 ml   Filed Weights   07/24/20 1615 07/25/20 0604 07/26/20 1540  Weight: (!) 172.8 kg (!) 172.6 kg (!) 166.3 kg   Examination:  General: 40 y.o. male resting in bed in NAD Eyes: PERRL, normal sclera ENMT: Nares patent w/o discharge, orophaynx clear, dentition normal, ears w/o discharge/lesions/ulcers Neck: Supple, trachea midline Cardiovascular: RRR, +S1, S2, no m/g/r, equal pulses throughout Respiratory: CTABL, no w/r/r, normal WOB GI: BS+, NDNT, no masses noted, no organomegaly noted MSK: No e/c/c Neuro: Alert, following commands, RUE/RLE weakness  Data Reviewed: I have personally  reviewed following labs and imaging studies  CBC: Recent Labs  Lab 07/24/20 1546 07/24/20 1554 07/25/20 0515 07/29/20 0302  WBC 6.4  --  4.2 8.7  NEUTROABS 3.1  --  2.9 5.4  HGB 18.0* 17.3* 15.1 13.3  HCT 51.0 51.0 43.1 37.2*  MCV 87.5  --  87.2 87.3  PLT 201  --  173 152   Basic Metabolic Panel: Recent Labs  Lab 07/24/20 1546 07/24/20 1554 07/25/20 0515 07/29/20 0302 07/30/20 0450  NA 141 142 139 141 137  K 3.6 3.7 4.1  3.3* 3.6  CL 102 103 103 105 100  CO2 24  --  23 26 26   GLUCOSE 164* 158* 165* 160* 169*  BUN 18 22* 17 14 16   CREATININE 1.10 1.00 1.05 0.97 0.96  CALCIUM 10.0  --  9.0 9.0 9.2  MG  --   --   --  1.8 1.9   GFR: Estimated Creatinine Clearance: 167.3 mL/min (by C-G formula based on SCr of 0.96 mg/dL).   Liver Function Tests: Recent Labs  Lab 07/24/20 1546 07/29/20 0302 07/30/20 0450  AST 51* 26 29  ALT 84* 46* 51*  ALKPHOS 65 41 40  BILITOT 0.8 1.0 1.0  PROT 7.9 6.3* 6.3*  ALBUMIN 4.1 3.0* 2.9*   No results for input(s): LIPASE, AMYLASE in the last 168 hours. No results for input(s): AMMONIA in the last 168 hours.  Coagulation Profile: Recent Labs  Lab 07/24/20 1546  INR 1.0   Cardiac Enzymes: No results for input(s): CKTOTAL, CKMB, CKMBINDEX, TROPONINI in the last 168 hours.  BNP (last 3 results) No results for input(s): PROBNP in the last 8760 hours.  HbA1C: No results for input(s): HGBA1C in the last 72 hours.  CBG: Recent Labs  Lab 07/30/20 1235 07/30/20 1647 07/30/20 2000 07/30/20 2355 07/31/20 0337  GLUCAP 185* 184* 147* 176* 169*   Lipid Profile: No results for input(s): CHOL, HDL, LDLCALC, TRIG, CHOLHDL, LDLDIRECT in the last 72 hours.  Thyroid Function Tests: No results for input(s): TSH, T4TOTAL, FREET4, T3FREE, THYROIDAB in the last 72 hours.  Anemia Panel: No results for input(s): VITAMINB12, FOLATE, FERRITIN, TIBC, IRON, RETICCTPCT in the last 72 hours.  Sepsis Labs: Recent Labs  Lab 07/24/20 1600 07/24/20 1604 07/25/20 0515  PROCALCITON <0.10  --   --   LATICACIDVEN  --  2.9* 1.6   Recent Results (from the past 240 hour(s))  MRSA PCR Screening     Status: None   Collection Time: 07/25/20  2:14 AM   Specimen: Nasal Mucosa; Nasopharyngeal  Result Value Ref Range Status   MRSA by PCR NEGATIVE NEGATIVE Final    Comment:        The GeneXpert MRSA Assay (FDA approved for NASAL specimens only), is one component of a comprehensive  MRSA colonization surveillance program. It is not intended to diagnose MRSA infection nor to guide or monitor treatment for MRSA infections. Performed at [facility], [address], [place], [address] [place]   Blood Culture (routine x 2)     Status: None   Collection Time: 07/25/20  5:15 AM   Specimen: BLOOD RIGHT HAND  Result Value Ref Range Status   Specimen Description BLOOD RIGHT HAND  Final   Special Requests   Final    BOTTLES DRAWN AEROBIC ONLY Blood Culture adequate volume   Culture   Final    NO GROWTH 5 DAYS Performed at [facility], [address], [place], [address] [place]  Report Status 07/30/2020 FINAL  Final  Blood Culture (routine x 2)     Status: None   Collection Time: 07/25/20  5:15 AM   Specimen: BLOOD RIGHT HAND  Result Value Ref Range Status   Specimen Description BLOOD RIGHT HAND  Final   Special Requests   Final    BOTTLES DRAWN AEROBIC ONLY Blood Culture adequate volume   Culture   Final    NO GROWTH 5 DAYS Performed at [facility], [address] [place], [place] [address]    Report Status 07/30/2020 FINAL  Final    Radiology Studies: No results found.  Scheduled Meds: . vitamin C  500 mg Oral Daily  . aspirin  300 mg Rectal Daily   Or  . aspirin  325 mg Oral Daily  . Chlorhexidine Gluconate Cloth  6 each Topical Q0600  . cholecalciferol  1,000 Units Oral QPM  . cloNIDine  0.2 mg Oral BID  . feeding supplement  237 mL Oral BID BM  . heparin injection (subcutaneous)  7,500 Units Subcutaneous Q8H  . hydrochlorothiazide  25 mg Oral Daily  . insulin aspart  0-15 Units Subcutaneous Q4H  . irbesartan  300 mg Oral Daily  . mouth rinse  15 mL Mouth Rinse BID  . meloxicam  15 mg Oral Daily  . metFORMIN  1,000 mg Oral BID WC  . metoprolol tartrate  100 mg Oral BID  . multivitamin with minerals  1 tablet Oral Daily  . pantoprazole sodium  40 mg Oral Daily  . potassium chloride  40 mEq Oral Daily  . rosuvastatin  10 mg  Oral Daily  . sertraline  25 mg Oral Daily  . sodium chloride flush  3 mL Intravenous Once  . zinc sulfate  220 mg Oral QPM    LOS: 7 days   Time spent: 25 minutes  Azucena Fallen, DO [facility]  If 7PM-7AM, please contact night-coverage [email or web address] 07/31/2020, 8:03 AM

## 2020-07-31 NOTE — Plan of Care (Signed)
  Problem: Education: Goal: Knowledge of General Education information will improve Description: Including pain rating scale, medication(s)/side effects and non-pharmacologic comfort measures Outcome: Progressing   Problem: Health Behavior/Discharge Planning: Goal: Ability to manage health-related needs will improve Outcome: Progressing   Problem: Clinical Measurements: Goal: Ability to maintain clinical measurements within normal limits will improve Outcome: Progressing Goal: Will remain free from infection Outcome: Progressing Goal: Diagnostic test results will improve Outcome: Progressing Goal: Respiratory complications will improve Outcome: Progressing Goal: Cardiovascular complication will be avoided Outcome: Progressing   Problem: Activity: Goal: Risk for activity intolerance will decrease Outcome: Progressing   Problem: Nutrition: Goal: Adequate nutrition will be maintained Outcome: Progressing   Problem: Coping: Goal: Level of anxiety will decrease Outcome: Progressing   Problem: Elimination: Goal: Will not experience complications related to bowel motility Outcome: Progressing Goal: Will not experience complications related to urinary retention Outcome: Progressing   Problem: Pain Managment: Goal: General experience of comfort will improve Outcome: Progressing   Problem: Safety: Goal: Ability to remain free from injury will improve Outcome: Progressing   Problem: Skin Integrity: Goal: Risk for impaired skin integrity will decrease Outcome: Progressing   Problem: Education: Goal: Knowledge of disease or condition will improve Outcome: Progressing Goal: Knowledge of secondary prevention will improve Outcome: Progressing Goal: Knowledge of patient specific risk factors addressed and post discharge goals established will improve Outcome: Progressing Goal: Individualized Educational Video(s) Outcome: Progressing   Problem: Coping: Goal: Will verbalize  positive feelings about self Outcome: Progressing Goal: Will identify appropriate support needs Outcome: Progressing   Problem: Health Behavior/Discharge Planning: Goal: Ability to manage health-related needs will improve Outcome: Progressing   Problem: Self-Care: Goal: Ability to participate in self-care as condition permits will improve Outcome: Progressing Goal: Verbalization of feelings and concerns over difficulty with self-care will improve Outcome: Progressing Goal: Ability to communicate needs accurately will improve Outcome: Progressing   Problem: Nutrition: Goal: Risk of aspiration will decrease Outcome: Progressing Goal: Dietary intake will improve Outcome: Progressing   Problem: Ischemic Stroke/TIA Tissue Perfusion: Goal: Complications of ischemic stroke/TIA will be minimized Outcome: Progressing   Problem: Safety: Goal: Non-violent Restraint(s) Outcome: Progressing   

## 2020-07-31 NOTE — Progress Notes (Signed)
Physical Therapy Treatment Patient Details Name: Justin Mason MRN: [id number] DOB: 03-03-1981 Today's Date: 07/31/2020    History of Present Illness 40 yo male with PMH of morbid obesity, DM2, HLD, HTN, known COVID-19 infection (dx less than 1 week PTA), history of shoulder surgery with residual R shoulder who presented to ED 1/19 with aphasia and RUE weakness beginning at about 1030 AM 07/24/2020 and progressing until 1230 same day with R facial droop. 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  Outside of window for tpa. Went to [facility] for revascularization but angioplasty of MCA unsuccessful due to resistance.    PT Comments    Pt continues to be very motivated to participate and improve and is capable of tolerating increased sessions. Pt would greatly benefit from intensive therapy in the CIR setting to maximize his independence and safety with all functional mobility as he is young. He requires increased time and effort for all functional mobility. His decreased strength on his R side places him at risk for injury or falls as he tends to forget placement of his R UE during mobility, needing constant cues to attend to it, and he tends to flex at his R hip and knee with R stance phase. Pt is capable of rolling to the R with min guard but needs maxA to go to the L due to his R sided weakness. In addition, he required modAx2 to come to stand to the stedy and min-modAx2 to maintain his balance with pre-gait training, shifting his weight laterally L <>R. Will continue to follow acutely. Current recommendations remain appropriate.   Follow Up Recommendations  CIR;Supervision/Assistance - 24 hour     Equipment Recommendations  Other (comment) (TBD)    Recommendations for Other Services       Precautions / Restrictions Precautions Precautions: Fall Precaution Comments: R inattention, R severe hemiparesis  (near plegia) Restrictions Weight Bearing Restrictions: No    Mobility  Bed Mobility Overal bed mobility: Needs Assistance Bed Mobility: Rolling;Sidelying to Sit Rolling: Max assist Sidelying to sit: +2 for physical assistance;+2 for safety/equipment;HOB elevated;Mod assist       General bed mobility comments: Manually moved R arm out of way to prevent injury, cuing pt to pull on R bed rail with L hand and push through L foot on bed to roll to the R with min guard assist. MaxA to roll to the L though. ModA+2 for sidelying > sit R EOB for trunk and LE management repositioning. Pt initiating attempt to push up when cued with L hand.  Transfers Overall transfer level: Needs assistance Equipment used: Ambulation equipment used Transfers: Sit to/from [employer] Sit to Stand: Mod assist;+2 physical assistance;+2 safety/equipment Stand pivot transfers: Total assist;+2 physical assistance;+2 safety/equipment       General transfer comment: Positioned pt's feet on platform of sara stedy with pt being impulsive trying to pull on stedy often, needing cues to wait until prepared. Cued pt for hand placement, needing hand-over-hand to maintain R on bar. Bed pads placed between pt's skin and the stedy to prevent rubbing as possible. Tactile cues and assistance provided at R quad and R glut to extend with pt initiating transfer, modAx2 to come to stand from EOB to stedy. 1x from stedy with only modAx1 and minAx1 to manage R foot/leg placement and prevent R lean.  Ambulation/Gait  General Gait Details: Pre-gait training in stedy, cuing pt to shift weight laterally between R and L leg and attempt to clear contralateral foot, needing min-modAx2 to maintain balance due to intermittent R hip and knee flexion with stance phase and R lean.   Stairs             Wheelchair Mobility    Modified Rankin (Stroke Patients Only) Modified Rankin (Stroke Patients  Only) Pre-Morbid Rankin Score: No symptoms Modified Rankin: Severe disability     Balance Overall balance assessment: Needs assistance Sitting-balance support: Single extremity supported;Feet supported;Bilateral upper extremity supported;No upper extremity supported Sitting balance-Leahy Scale: Fair Sitting balance - Comments: preference for R lateral leaning, correctable with verbal cues for upright sitting. Able to sit EOB with 2-no UE support, min guard-minA. Postural control: Right lateral lean Standing balance support: Bilateral upper extremity supported;During functional activity Standing balance-Leahy Scale: Poor Standing balance comment: Min-modAx2 for standing balance in stedy with UE support.                            Cognition Arousal/Alertness: Awake/alert Behavior During Therapy: Flat affect;Impulsive Overall Cognitive Status: Impaired/Different from baseline Area of Impairment: Following commands;Safety/judgement;Awareness;Problem solving                       Following Commands: Follows one step commands inconsistently;Follows one step commands with increased time Safety/Judgement: Decreased awareness of safety;Decreased awareness of deficits Awareness: Emergent Problem Solving: Difficulty sequencing;Decreased initiation;Requires verbal cues;Requires tactile cues General Comments: Pt inconsistently follows mobility commands, can be impulsive at times. Required simple commands to sequence tasks. Flat affect throughout.      Exercises      General Comments        Pertinent Vitals/Pain Pain Assessment: No/denies pain Faces Pain Scale: No hurt Pain Intervention(s): Monitored during session    Home Living                      Prior Function            PT Goals (current goals can now be found in the care plan section) Acute Rehab PT Goals Patient Stated Goal: get pt as independent as possible PT Goal Formulation: With  patient Time For Goal Achievement: 08/09/20 Potential to Achieve Goals: Good Progress towards PT goals: Progressing toward goals    Frequency    Min 4X/week      PT Plan Current plan remains appropriate    Co-evaluation              AM-PAC PT "6 Clicks" Mobility   Outcome Measure  Help needed turning from your back to your side while in a flat bed without using bedrails?: A Lot Help needed moving from lying on your back to sitting on the side of a flat bed without using bedrails?: A Lot Help needed moving to and from a bed to a chair (including a wheelchair)?: Total Help needed standing up from a chair using your arms (e.g., wheelchair or bedside chair)?: A Lot Help needed to walk in hospital room?: Total Help needed climbing 3-5 steps with a railing? : Total 6 Click Score: 9    End of Session Equipment Utilized During Treatment: Gait belt Activity Tolerance: Patient tolerated treatment well Patient left: in chair;with call bell/phone within reach;with chair alarm set Nurse Communication: Mobility status;Need for lift equipment PT Visit Diagnosis: Hemiplegia and hemiparesis;Other abnormalities of gait and mobility (  R26.89);Unsteadiness on feet (R26.81);Muscle weakness (generalized) (M62.81);Difficulty in walking, not elsewhere classified (R26.2);Other symptoms and signs involving the nervous system (R29.898) Hemiplegia - Right/Left: Right Hemiplegia - dominant/non-dominant: Dominant Hemiplegia - caused by: Cerebral infarction     Time: 1340-1413 PT Time Calculation (min) (ACUTE ONLY): 33 min  Charges:  $Gait Training: 8-22 mins $Therapeutic Activity: 8-22 mins                     Raymond Gurney, PT, DPT Acute Rehabilitation Services  Pager: [phone] Office: [phone]    Jewel Baize 07/31/2020, 5:21 PM

## 2020-08-01 DIAGNOSIS — I639 Cerebral infarction, unspecified: Secondary | ICD-10-CM | POA: Diagnosis not present

## 2020-08-01 LAB — GLUCOSE, CAPILLARY
Glucose-Capillary: 156 mg/dL — ABNORMAL HIGH (ref 70–99)
Glucose-Capillary: 131 mg/dL — ABNORMAL HIGH (ref 70–99)
Glucose-Capillary: 151 mg/dL — ABNORMAL HIGH (ref 70–99)
Glucose-Capillary: 174 mg/dL — ABNORMAL HIGH (ref 70–99)
Glucose-Capillary: 153 mg/dL — ABNORMAL HIGH (ref 70–99)
Glucose-Capillary: 143 mg/dL — ABNORMAL HIGH (ref 70–99)
Glucose-Capillary: 138 mg/dL — ABNORMAL HIGH (ref 70–99)

## 2020-08-01 NOTE — Progress Notes (Signed)
Occupational Therapy Treatment Patient Details Name: Justin Mason MRN: [id number] DOB: [birth year] Today's Date: 08/01/2020    History of present illness 40 yo male with PMH of morbid obesity, DM2, HLD, HTN, known COVID-19 infection (dx less than 1 week PTA), history of shoulder surgery with residual R shoulder who presented to ED 1/19 with aphasia and RUE weakness beginning at about 1030 AM 07/24/2020 and progressing until 1230 same day with R facial droop. 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  Outside of window for tpa. Went to [facility] for revascularization but angioplasty of MCA unsuccessful due to resistance.   OT comments  Pt making steady progress towards OT goals this session. Pt continues to present with impaired balance, decreased activity tolerance, and R sided weakness impacting pts ability to complete BADLs independently. Pt continues to present with global aphasia but following commands with increased time and noted to smile throughout session. Pt currently requires up to MAX A for bed mobility and MOD A +2 to complete x4 sit<>stands in stedy demonstrating improved activity tolerance. Pt with improved sitting balance with pt able to sit EOB with LUE supported while pt took meds with min guard +1 from COTA on pts R side. Pt continues to be an excellent CIR candidate. Continue to highly recommend CIR, will continue to follow acutely per POC.    Follow Up Recommendations  CIR    Equipment Recommendations  Wheelchair (measurements OT);Wheelchair cushion (measurements OT);3 in 1 bedside commode    Recommendations for Other Services      Precautions / Restrictions Precautions Precautions: Fall Precaution Comments: R inattention, R severe hemiparesis (near plegia) Restrictions Weight Bearing Restrictions: No       Mobility Bed Mobility Overal bed mobility: Needs Assistance Bed Mobility:  Rolling;Sidelying to Sit Rolling: Max assist Sidelying to sit: +2 for physical assistance;+2 for safety/equipment;HOB elevated;Mod assist       General bed mobility comments: pt able to roll to L side of bed with MAX A needing assist to being RUE across body tor each for rail, pt assisting with LUE by pushing into bed to elevate trunk needing MOD A +2 to transitioning into sitting. use of bed pads to scoot hips fully to EOB, however pt using LUE to scoot forward  Transfers Overall transfer level: Needs assistance Equipment used: Ambulation equipment used Transfers: Sit to/from [employer] Sit to Stand: Mod assist;+2 physical assistance;+2 safety/equipment Stand pivot transfers: Total assist;+2 physical assistance;+2 safety/equipment;From elevated surface       General transfer comment: pt required assist to position RUE on bar of Stedy and hand over hand assist to maintain positioning, pt requried MOD A +2 to power up into standing from elevated EOB and seat of stedy ( x4 total sit<>stands) pt continues to requrie assist to R quad and R glut to extend fully. pt required MOD A at times to [facility] static standing in stedy d/t R lateral lean but able to initaite correcting with tactile and visual cues    Balance Overall balance assessment: Needs assistance Sitting-balance support: Single extremity supported;Feet supported Sitting balance-Leahy Scale: Poor Sitting balance - Comments: pt able to sit EOB to take meds with LUE supported with close minguard of therapists on pts R side. R lateral lean noted at times however pt able to self correct with tactile cues Postural control: Right lateral lean Standing balance support: Bilateral upper extremity supported;During  functional activity Standing balance-Leahy Scale: Poor Standing balance comment: up to MOD A at times for static standing balance                           ADL either performed or assessed with clinical  judgement   ADL Overall ADL's : Needs assistance/impaired Eating/Feeding: Set up;Sitting Eating/Feeding Details (indicate cue type and reason): able to bring cup to mouth with LUE wit set- up assist         Lower Body Bathing: Total assistance;Sit to/from stand;Moderate assistance Lower Body Bathing Details (indicate cue type and reason): total A for simulated LB bathing tasks via posterior pericare, MOD A for standing balance in stedy d/t R lateral lean         Toilet Transfer: Moderate assistance;+2 for physical assistance;Total assistance Toilet Transfer Details (indicate cue type and reason): simualated via stedy transfer to recliner, MOD A +2 to power into standing with stedy,total A for pivot with stedy, pt requries assist to manage RUE on stedy and physical assist at RLE to come into full extension Toileting- Clothing Manipulation and Hygiene: Total assistance;Moderate assistance;Sit to/from stand Toileting - Clothing Manipulation Details (indicate cue type and reason): MOD A for standing balance in stedy, total A for posterior pericare in standing     Functional mobility during ADLs: Moderate assistance;+2 for physical assistance;+2 for safety/equipment (sit<>stand in stedy) General ADL Comments: pt making good progress, completing x4 sit<>stands with MOD A +1, 1 from EOB and x3 from seat of stedy     Vision       Perception     Praxis      Cognition Arousal/Alertness: Awake/alert Behavior During Therapy: Flat affect;WFL for tasks assessed/performed Overall Cognitive Status: Impaired/Different from baseline Area of Impairment: Following commands;Safety/judgement;Awareness;Problem solving                       Following Commands: Follows one step commands with increased time Safety/Judgement: Decreased awareness of safety;Decreased awareness of deficits Awareness: Emergent Problem Solving: Difficulty sequencing;Decreased initiation;Requires verbal  cues;Requires tactile cues General Comments: pt following commands with increased time and nodding appropriately during session. pt flat throughout session but very appropriate and noted to smile at times        Exercises     Shoulder Instructions       General Comments VSS    Pertinent Vitals/ Pain       Pain Assessment: Faces Faces Pain Scale: No hurt  Home Living                                          Prior Functioning/Environment              Frequency  Min 2X/week        Progress Toward Goals  OT Goals(current goals can now be found in the care plan section)  Progress towards OT goals: Progressing toward goals  Acute Rehab OT Goals Patient Stated Goal: get pt as independent as possible OT Goal Formulation: Patient unable to participate in goal setting Time For Goal Achievement: 08/09/20 Potential to Achieve Goals: Good  Plan Discharge plan remains appropriate;Frequency remains appropriate    Co-evaluation                 AM-PAC OT "6 Clicks" Daily Activity     Outcome Measure  Help from another person eating meals?: A Little Help from another person taking care of personal grooming?: A Little Help from another person toileting, which includes using toliet, bedpan, or urinal?: A Lot Help from another person bathing (including washing, rinsing, drying)?: A Lot Help from another person to put on and taking off regular upper body clothing?: A Lot Help from another person to put on and taking off regular lower body clothing?: Total 6 Click Score: 13    End of Session Equipment Utilized During Treatment: Gait belt;Other (comment) (stedy)  OT Visit Diagnosis: Unsteadiness on feet (R26.81);Muscle weakness (generalized) (M62.81)   Activity Tolerance Patient tolerated treatment well   Patient Left Other (comment);with nursing/sitter in room (passed off pt to PT with pt seated on seat of stedy)   Nurse Communication Mobility  status        Time: 0454-0981 OT Time Calculation (min): 36 min  Charges: OT General Charges $OT Visit: 1 Visit OT Treatments $Self Care/Home Management : 8-22 mins $Therapeutic Activity: 8-22 mins  Lenor Derrick., COTA/L Acute Rehabilitation Services [phone]    Barron Schmid 08/01/2020, 11:07 AM

## 2020-08-01 NOTE — Progress Notes (Signed)
Nutrition Follow-up  DOCUMENTATION CODES:   Not applicable  INTERVENTION:   Continue Ensure Enlive po BID, each supplement provides 350 kcal and 20 grams of protein  Continue mvi with minerals daily  NUTRITION DIAGNOSIS:   Increased nutrient needs related to acute illness (COVID infection) as evidenced by estimated needs.  ongoing  GOAL:   Patient will meet greater than or equal to 90% of their needs  progressing  MONITOR:   PO intake,Supplement acceptance,Diet advancement,Weight trends,Labs,I & O's  REASON FOR ASSESSMENT:   Other (Comment)    ASSESSMENT:   Patient with PMH significant for HLD, DM, HTN, and systolic murmur. Presents this admission with acute MCA stroke and known COVID 19 infection.  1/19 - intubated; NIR unsuccessful angioplasty of MCA  1/20 - extubated 1/21 - diet advanced to dysphagia 2 with thin liquids s/p FEES 1/22 - diet advanced to dysphagia 3 with thin liquids 1/24 - diet advanced to carb modified with thin liquids  Pt with fair-good PO intake since last RD assessment. Last 8 meal completions charted as 10-100% (64% average meal intake). Pt has been receiving Ensure BID and has been consuming supplement well per RN. Continue current nutrition plan of care.  UOP: x24 hours  Noted generalized mild pitting edema per RN assessment  Labs: CBGs [phone] Medications: vitamin c, vitamin D3, ss novolog Q4H, glucophage, mvi with minerals, klor-con, zinc sulfate   Diet Order:   Diet Order            Diet Carb Modified Fluid consistency: Thin; Room service appropriate? No  Diet effective now                 EDUCATION NEEDS:   Not appropriate for education at this time  Skin:  Skin Assessment: Skin Integrity Issues: Skin Integrity Issues:: Incisions Incisions: R groin  Last BM:  08/01/20  Height:   Ht Readings from Last 1 Encounters:  07/12/20 6\' 1"  (1.854 m)    Weight:   Wt Readings from Last 1 Encounters:   07/26/20 (!) 166.3 kg    BMI:  Body mass index is 48.37 kg/m.  Estimated Nutritional Needs:   Kcal:  2300-2500 kcal  Protein:  115-130 grams  Fluid:  >/= 2 L/day    07/28/20, MS, RD, LDN RD pager number and weekend/on-call pager number located in Amion.

## 2020-08-01 NOTE — Progress Notes (Signed)
Physical Therapy Treatment Patient Details Name: Justin Mason MRN: [id number] DOB: [birth year] Today's Date: 08/01/2020    History of Present Illness 40 yo male with PMH of morbid obesity, DM2, HLD, HTN, known COVID-19 infection (dx less than 1 week PTA), history of shoulder surgery with residual R shoulder who presented to ED 1/19 with aphasia and RUE weakness beginning at about 1030 AM 07/24/2020 and progressing until 1230 same day with R facial droop. 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  Outside of window for tpa. Went to [facility] for revascularization but angioplasty of MCA unsuccessful due to resistance.    PT Comments    Pt continues to be very motivated and able to withstand increased treatment sessions as he was passed from OT to PT directly for back-to-back sessions this date. Pt demonstrates progress towards his goals, only requiring min-modAx1 to come to stand from the stedy and maintain his balance in the stedy this date. Focused session on pre-gait training and R leg strengthening to decrease his risk for falls. Unfortunately, he continues to demonstrate no muscle activation with palpation of his R leg muscles. Educated pt on HEP to perform bil leg exercises to encourage neural crossover. Will continue to follow acutely. Current recommendations remain appropriate.  Follow Up Recommendations  CIR;Supervision/Assistance - 24 hour     Equipment Recommendations  Other (comment) (TBD)    Recommendations for Other Services       Precautions / Restrictions Precautions Precautions: Fall Precaution Comments: R inattention, R severe hemiparesis (near plegia) Restrictions Weight Bearing Restrictions: No    Mobility  Bed Mobility Overal bed mobility: Needs Assistance Bed Mobility: Rolling;Sidelying to Sit Rolling: Max assist Sidelying to sit: +2 for physical assistance;+2 for  safety/equipment;HOB elevated;Mod assist       General bed mobility comments: Pt sitting up in stedy with OT upon arrival.  Transfers Overall transfer level: Needs assistance Equipment used: Ambulation equipment used Transfers: Sit to/from [employer] Sit to Stand: Mod assist Stand pivot transfers: Total assist       General transfer comment: Pt sitting up in stedy with OT upon arrival, with OT handing pt off to PT. Pt able to come to stand from stedy with min-modA and tactile and verbal cues at R glut and quad to extend hip and knee. Verbal and visual cues to maintain midline, cuing pt to watch his head/shoulders between the 2 grey bars of the stedy, mod success. TA on stedy to pivot to chair from EOB.  Ambulation/Gait             General Gait Details: Pre-gait training in stedy, cuing pt to shift weight laterally between R and L leg, needing min-modAx1 to maintain balance due to intermittent R hip and knee flexion with stance phase and R lean. Provided extensive verbal, visual, and tactile cues to extend R hip and knee with R stance, mod success. x3 bouts of ~1-2 min each.   Stairs             Wheelchair Mobility    Modified Rankin (Stroke Patients Only) Modified Rankin (Stroke Patients Only) Pre-Morbid Rankin Score: No symptoms Modified Rankin: Severe disability     Balance Overall balance assessment: Needs assistance Sitting-balance support: Single extremity supported;Feet supported Sitting balance-Leahy Scale: Poor Sitting balance - Comments: pt able to sit EOB to take meds with LUE supported with close minguard of therapists  on pts R side. R lateral lean noted at times however pt able to self correct with tactile cues Postural control: Right lateral lean Standing balance support: Bilateral upper extremity supported;During functional activity Standing balance-Leahy Scale: Poor Standing balance comment: Min-modAx1 for standing balance in stedy  with UE support.                            Cognition Arousal/Alertness: Awake/alert Behavior During Therapy: Flat affect;Impulsive Overall Cognitive Status: Impaired/Different from baseline Area of Impairment: Following commands;Safety/judgement;Awareness;Problem solving                       Following Commands: Follows one step commands with increased time;Follows one step commands consistently Safety/Judgement: Decreased awareness of safety;Decreased awareness of deficits Awareness: Emergent Problem Solving: Difficulty sequencing;Decreased initiation;Requires verbal cues;Requires tactile cues General Comments: Pt follows simple mobility commands, can be impulsive at times. Required simple commands to sequence tasks. Flat affect throughout.      Exercises General Exercises - Lower Extremity Ankle Circles/Pumps: AAROM;Both;5 reps;Seated (no muscle activation noted with palpation on R) Long Arc Quad: 5 reps;Seated;Both (no muscle activation noted with palpation on R) Mini-Sqauts: Strengthening;Right;Other reps (comment) (sit to stand from stedy 5x > 10x while leaning to R and cued to utilize R leg more than L)    General Comments General comments (skin integrity, edema, etc.): Noted redness at buttocks and possible skin tear at R inferior buttocks, notified RN tech who reported she would let RN know; applied sacral cushion bandage and barrier cream to buttocks      Pertinent Vitals/Pain Pain Assessment: No/denies pain Faces Pain Scale: No hurt Pain Intervention(s): Monitored during session    Home Living                      Prior Function            PT Goals (current goals can now be found in the care plan section) Acute Rehab PT Goals Patient Stated Goal: get pt as independent as possible PT Goal Formulation: With patient Time For Goal Achievement: 08/09/20 Potential to Achieve Goals: Good Progress towards PT goals: Progressing toward  goals    Frequency    Min 4X/week      PT Plan Current plan remains appropriate    Co-evaluation              AM-PAC PT "6 Clicks" Mobility   Outcome Measure  Help needed turning from your back to your side while in a flat bed without using bedrails?: A Lot Help needed moving from lying on your back to sitting on the side of a flat bed without using bedrails?: A Lot Help needed moving to and from a bed to a chair (including a wheelchair)?: Total Help needed standing up from a chair using your arms (e.g., wheelchair or bedside chair)?: A Lot Help needed to walk in hospital room?: Total Help needed climbing 3-5 steps with a railing? : Total 6 Click Score: 9    End of Session Equipment Utilized During Treatment: Gait belt Activity Tolerance: Patient tolerated treatment well Patient left: in chair;with call bell/phone within reach;with chair alarm set Nurse Communication: Mobility status;Need for lift equipment PT Visit Diagnosis: Hemiplegia and hemiparesis;Other abnormalities of gait and mobility (R26.89);Unsteadiness on feet (R26.81);Muscle weakness (generalized) (M62.81);Difficulty in walking, not elsewhere classified (R26.2);Other symptoms and signs involving the nervous system (R29.898) Hemiplegia - Right/Left: Right Hemiplegia -  dominant/non-dominant: Dominant Hemiplegia - caused by: Cerebral infarction     Time: 1062-6948 PT Time Calculation (min) (ACUTE ONLY): 34 min  Charges:  $Gait Training: 8-22 mins $Therapeutic Exercise: 8-22 mins                     Raymond Gurney, PT, DPT Acute Rehabilitation Services  Pager: [phone] Office: [phone]    Jewel Baize 08/01/2020, 12:55 PM

## 2020-08-01 NOTE — Progress Notes (Signed)
PROGRESS NOTE    Justin Mason  JXB:[id number] DOB: [birth year] DOA: 07/24/2020 PCP: Eber Jones, NP   Brief Narrative:   40 yo M PMH morbid obesity, DM2, HLD, HTN, known COVID-19 infection (dx less than 1 week PTA) who presented to ED 1/19 with aphasia and RUE weakness beginning at about 1030 AM 07/24/2020 and progressing until 1230 same day with R facial droop. CT H with concern for L sided infarcts CTA with short segment occlusion of R M1/MCA, L ICA occlusion, proximal L M1, stenosis of PCAs. Outside of window for tpa. Went to [facility] rev[facility] but angioplasty of MCA unsuccessful due to resistance.  1/25: No acute event ON. Working on rehab options. Continue current regimen.  Assessment & Plan:  L ICA occlusion, severe R MCA stenosis - Presenting with right side weakness and aphasia - Unsuccessful revascularization due to chronicity - Cause of decompensation of chronic disease unclear - possible hypoglycemic episode.  - Neuro eval'd: DAPT for 3 months then transition to single agent - Continue statin - SLP following - diet improving - back to regular/thins currently - Liquid Administration via Cup;Straw - Medication Administration Crushed with puree - Compensations:Slow rate; Minimize environmental distractions; Small sips/bites; Lingual sweep for clearance of pocketing - Postural Changes:Seated upright at 90 degrees - 1/25: continuing to work with rehab services; still working on placement.  Incidental covid positive - Acute respiratory failure post procedure - Hypoxia due to procedure; resolved - No current indication for COVID treatment should come off quarantine 08/03/20 - 1/25: Stable on RA  Hypertension.  - Completed permissive HTN window - HCTZ, irbesartan, clonidine, metoprolol - BP stable - continue to follow  Hypokalemia - Resolved - no longer following given improved PO intake  DVT prophylaxis: Heparin Code Status: FULL Family Communication:  Mother updated at length about prognosis/disposition Status is: Inpatient  Remains inpatient appropriate because:Inpatient level of care appropriate due to severity of illness  Dispo: The patient is from: Home             Anticipated d/c is to: SNF vs CIR             Anticipated d/c date is: > 72h             Patient currently is not medically stable to d/c.  Difficult to place patient No  Consultants:   Neurology  Subjective: No acute events overnight - tolerating PT today at bedside - smiling and very excited to be moving out of bed  Objective: Vitals:   07/31/20 1956 07/31/20 2313 08/01/20 0447 08/01/20 0733  BP: 122/79 135/74 131/73 (!) 126/51  Pulse: 63 68 62   Resp: 19 18 17    Temp: 98.4 F (36.9 C) 98.3 F (36.8 C) 98.2 F (36.8 C)   TempSrc: Oral Oral Oral   SpO2: 98% 99% 96%   Weight:        Intake/Output Summary (Last 24 hours) at 08/01/2020 0755 Last data filed at 08/01/2020 0448 Gross per 24 hour  Intake --  Output 750 ml  Net -750 ml   Filed Weights   07/24/20 1615 07/25/20 0604 07/26/20 1540  Weight: (!) 172.8 kg (!) 172.6 kg (!) 166.3 kg   Examination:  General: 40 y.o. male resting in bed in NAD Eyes: PERRL, normal sclera ENMT: Nares patent w/o discharge, orophaynx clear, dentition normal, ears w/o discharge/lesions/ulcers Neck: Supple, trachea midline Cardiovascular: RRR, +S1, S2, no m/g/r, equal pulses throughout Respiratory: CTABL, no w/r/r, normal WOB GI: BS+, NDNT,  no masses noted, no organomegaly noted Neuro: Alert, following commands, RUE/RLE profound weakness  Data Reviewed: I have personally reviewed following labs and imaging studies  CBC: Recent Labs  Lab 07/29/20 0302  WBC 8.7  NEUTROABS 5.4  HGB 13.3  HCT 37.2*  MCV 87.3  PLT 152   Basic Metabolic Panel: Recent Labs  Lab 07/29/20 0302 07/30/20 0450  NA 141 137  K 3.3* 3.6  CL 105 100  CO2 26 26  GLUCOSE 160* 169*  BUN 14 16  CREATININE 0.97 0.96  CALCIUM 9.0  9.2  MG 1.8 1.9   GFR: Estimated Creatinine Clearance: 167.3 mL/min (by C-G formula based on SCr of 0.96 mg/dL).   Liver Function Tests: Recent Labs  Lab 07/29/20 0302 07/30/20 0450  AST 26 29  ALT 46* 51*  ALKPHOS 41 40  BILITOT 1.0 1.0  PROT 6.3* 6.3*  ALBUMIN 3.0* 2.9*   No results for input(s): LIPASE, AMYLASE in the last 168 hours. No results for input(s): AMMONIA in the last 168 hours.  Coagulation Profile: No results for input(s): INR, PROTIME in the last 168 hours. Cardiac Enzymes: No results for input(s): CKTOTAL, CKMB, CKMBINDEX, TROPONINI in the last 168 hours.  BNP (last 3 results) No results for input(s): PROBNP in the last 8760 hours.  HbA1C: No results for input(s): HGBA1C in the last 72 hours.  CBG: Recent Labs  Lab 07/31/20 1715 07/31/20 2038 07/31/20 2317 08/01/20 0439 08/01/20 0737  GLUCAP 153* 141* 153* 156* 131*   Lipid Profile: No results for input(s): CHOL, HDL, LDLCALC, TRIG, CHOLHDL, LDLDIRECT in the last 72 hours.  Thyroid Function Tests: No results for input(s): TSH, T4TOTAL, FREET4, T3FREE, THYROIDAB in the last 72 hours.  Anemia Panel: No results for input(s): VITAMINB12, FOLATE, FERRITIN, TIBC, IRON, RETICCTPCT in the last 72 hours.  Sepsis Labs: No results for input(s): PROCALCITON, LATICACIDVEN in the last 168 hours. Recent Results (from the past 240 hour(s))  MRSA PCR Screening     Status: None   Collection Time: 07/25/20  2:14 AM   Specimen: Nasal Mucosa; Nasopharyngeal  Result Value Ref Range Status   MRSA by PCR NEGATIVE NEGATIVE Final    Comment:        The GeneXpert MRSA Assay (FDA approved for NASAL specimens only), is one component of a comprehensive MRSA colonization surveillance program. It is not intended to diagnose MRSA infection nor to guide or monitor treatment for MRSA infections. Performed at [facility], [address], [place], [place] [address]   Blood Culture (routine x 2)     Status:  None   Collection Time: 07/25/20  5:15 AM   Specimen: BLOOD RIGHT HAND  Result Value Ref Range Status   Specimen Description BLOOD RIGHT HAND  Final   Special Requests   Final    BOTTLES DRAWN AEROBIC ONLY Blood Culture adequate volume   Culture   Final    NO GROWTH 5 DAYS Performed at [facility], [address], [place], [place] [address]    Report Status 07/30/2020 FINAL  Final  Blood Culture (routine x 2)     Status: None   Collection Time: 07/25/20  5:15 AM   Specimen: BLOOD RIGHT HAND  Result Value Ref Range Status   Specimen Description BLOOD RIGHT HAND  Final   Special Requests   Final    BOTTLES DRAWN AEROBIC ONLY Blood Culture adequate volume   Culture   Final    NO GROWTH 5 DAYS  Performed at [facility], [address] [place], [place] [address]    Report Status 07/30/2020 FINAL  Final    Radiology Studies: No results found.  Scheduled Meds: . vitamin C  500 mg Oral Daily  . aspirin  300 mg Rectal Daily   Or  . aspirin  325 mg Oral Daily  . Chlorhexidine Gluconate Cloth  6 each Topical Q0600  . cholecalciferol  1,000 Units Oral QPM  . cloNIDine  0.2 mg Oral BID  . feeding supplement  237 mL Oral BID BM  . heparin injection (subcutaneous)  7,500 Units Subcutaneous Q8H  . hydrochlorothiazide  25 mg Oral Daily  . insulin aspart  0-15 Units Subcutaneous Q4H  . irbesartan  300 mg Oral Daily  . mouth rinse  15 mL Mouth Rinse BID  . meloxicam  15 mg Oral Daily  . metFORMIN  1,000 mg Oral BID WC  . metoprolol tartrate  100 mg Oral BID  . multivitamin with minerals  1 tablet Oral Daily  . pantoprazole sodium  40 mg Oral Daily  . potassium chloride  40 mEq Oral Daily  . rosuvastatin  10 mg Oral Daily  . sertraline  25 mg Oral Daily  . sodium chloride flush  3 mL Intravenous Once  . zinc sulfate  220 mg Oral QPM    LOS: 8 days   Time spent: 25 minutes  Azucena Fallen, DO [facility]  If 7PM-7AM, please contact  night-coverage [email or web address] 08/01/2020, 7:54 AM

## 2020-08-01 NOTE — Plan of Care (Signed)
  Problem: Education: Goal: Knowledge of General Education information will improve Description: Including pain rating scale, medication(s)/side effects and non-pharmacologic comfort measures Outcome: Progressing   Problem: Health Behavior/Discharge Planning: Goal: Ability to manage health-related needs will improve Outcome: Progressing   Problem: Clinical Measurements: Goal: Ability to maintain clinical measurements within normal limits will improve Outcome: Progressing Goal: Will remain free from infection Outcome: Progressing Goal: Diagnostic test results will improve Outcome: Progressing Goal: Respiratory complications will improve Outcome: Progressing Goal: Cardiovascular complication will be avoided Outcome: Progressing   Problem: Activity: Goal: Risk for activity intolerance will decrease Outcome: Progressing   Problem: Nutrition: Goal: Adequate nutrition will be maintained Outcome: Progressing   Problem: Coping: Goal: Level of anxiety will decrease Outcome: Progressing   Problem: Elimination: Goal: Will not experience complications related to bowel motility Outcome: Progressing Goal: Will not experience complications related to urinary retention Outcome: Progressing   Problem: Pain Managment: Goal: General experience of comfort will improve Outcome: Progressing   Problem: Safety: Goal: Ability to remain free from injury will improve Outcome: Progressing   Problem: Skin Integrity: Goal: Risk for impaired skin integrity will decrease Outcome: Progressing   Problem: Education: Goal: Knowledge of disease or condition will improve Outcome: Progressing Goal: Knowledge of secondary prevention will improve Outcome: Progressing Goal: Knowledge of patient specific risk factors addressed and post discharge goals established will improve Outcome: Progressing Goal: Individualized Educational Video(s) Outcome: Progressing   Problem: Coping: Goal: Will verbalize  positive feelings about self Outcome: Progressing Goal: Will identify appropriate support needs Outcome: Progressing   Problem: Health Behavior/Discharge Planning: Goal: Ability to manage health-related needs will improve Outcome: Progressing   Problem: Self-Care: Goal: Ability to participate in self-care as condition permits will improve Outcome: Progressing Goal: Verbalization of feelings and concerns over difficulty with self-care will improve Outcome: Progressing Goal: Ability to communicate needs accurately will improve Outcome: Progressing   Problem: Nutrition: Goal: Risk of aspiration will decrease Outcome: Progressing Goal: Dietary intake will improve Outcome: Progressing   Problem: Ischemic Stroke/TIA Tissue Perfusion: Goal: Complications of ischemic stroke/TIA will be minimized Outcome: Progressing   Problem: Safety: Goal: Non-violent Restraint(s) Outcome: Progressing   

## 2020-08-02 DIAGNOSIS — I639 Cerebral infarction, unspecified: Secondary | ICD-10-CM | POA: Diagnosis not present

## 2020-08-02 LAB — GLUCOSE, CAPILLARY
Glucose-Capillary: 172 mg/dL — ABNORMAL HIGH (ref 70–99)
Glucose-Capillary: 160 mg/dL — ABNORMAL HIGH (ref 70–99)
Glucose-Capillary: 133 mg/dL — ABNORMAL HIGH (ref 70–99)
Glucose-Capillary: 149 mg/dL — ABNORMAL HIGH (ref 70–99)
Glucose-Capillary: 150 mg/dL — ABNORMAL HIGH (ref 70–99)
Glucose-Capillary: 137 mg/dL — ABNORMAL HIGH (ref 70–99)

## 2020-08-02 MED ORDER — INSULIN ASPART 100 UNIT/ML ~~LOC~~ SOLN
0.0000 [IU] | Freq: Three times a day (TID) | SUBCUTANEOUS | Status: DC
  Administered 2020-08-02 (×2): 2 [IU] via SUBCUTANEOUS
  Administered 2020-08-03: 3 [IU] via SUBCUTANEOUS
  Administered 2020-08-03: 2 [IU] via SUBCUTANEOUS
  Administered 2020-08-03 – 2020-08-04 (×2): 3 [IU] via SUBCUTANEOUS
  Administered 2020-08-04: 2 [IU] via SUBCUTANEOUS
  Administered 2020-08-04 – 2020-08-05 (×2): 3 [IU] via SUBCUTANEOUS
  Administered 2020-08-05: 2 [IU] via SUBCUTANEOUS
  Administered 2020-08-05 (×2): 3 [IU] via SUBCUTANEOUS
  Administered 2020-08-06: 2 [IU] via SUBCUTANEOUS
  Administered 2020-08-06: 3 [IU] via SUBCUTANEOUS
  Administered 2020-08-06: 5 [IU] via SUBCUTANEOUS
  Administered 2020-08-06: 2 [IU] via SUBCUTANEOUS
  Administered 2020-08-07 (×3): 3 [IU] via SUBCUTANEOUS
  Administered 2020-08-07 – 2020-08-08 (×4): 2 [IU] via SUBCUTANEOUS
  Administered 2020-08-08 – 2020-08-09 (×3): 3 [IU] via SUBCUTANEOUS

## 2020-08-02 NOTE — Progress Notes (Signed)
PROGRESS NOTE    Justin Mason  ZYS:[id number] DOB: 1980/11/27 DOA: 07/24/2020 PCP: Eber Jones, NP   Brief Narrative:   40 yo M PMH morbid obesity, DM2, HLD, HTN, known COVID-19 infection (dx less than 1 week PTA) who presented to ED 1/19 with aphasia and RUE weakness beginning at about 1030 AM 07/24/2020 and progressing until 1230 same day with R facial droop. CT H with concern for L sided infarcts CTA with short segment occlusion of R M1/MCA, L ICA occlusion, proximal L M1, stenosis of PCAs. Outside of window for tpa. Went to [facility] rev[facility] but angioplasty of MCA unsuccessful due to resistance. Patient continues to progress daily with speech and PT - on covid precautions through 08/02/20 which limits disposition. Hopefully now that off covid (as of 08/03/20) he will be able to dispo to CIR vs SNF given ongoing needs.  Assessment & Plan:  L ICA occlusion, severe R MCA stenosis - Presenting with right side weakness and aphasia - Unsuccessful revascularization due to chronicity - Cause of decompensation of chronic disease unclear - possible hypoglycemic episode.  - Neuro eval'd: DAPT for 3 months then transition to single agent - Continue statin - SLP following - diet improving - back to regular/thins currently - Liquid Administration via Cup;Straw - Medication Administration Crushed with puree - Compensations:Slow rate; Minimize environmental distractions; Small sips/bites; Lingual sweep for clearance of pocketing - Postural Changes:Seated upright at 90 degrees - 1/25: continuing to work with rehab services; still working on placement (limited due to previous covid/quarantine precautions).  Incidental covid positive - Acute respiratory failure post procedure - Hypoxia due to procedure; resolved - No current indication for COVID treatment   - Off quarantine 08/03/20 - 1/25: Stable on RA  Hypertension.  - Completed permissive HTN window - HCTZ, irbesartan,  clonidine, metoprolol - BP stable - continue to follow  Hypokalemia - Resolved - no longer following given improved PO intake  DVT prophylaxis: Heparin Code Status: FULL Family Communication: Mother updated at length about prognosis/disposition Status is: Inpatient  Remains inpatient appropriate because:Inpatient level of care appropriate due to severity of illness  Dispo: The patient is from: Home             Anticipated d/c is to: SNF vs CIR             Anticipated d/c date is: 24-48h             Patient currently is medically stable to d/c.  Difficult to place patient No  Consultants:   Neurology  Subjective: No acute events overnight - still poorly verbal but smiling this am and much more pleasant/hopeful than last week.  Objective: Vitals:   08/01/20 0918 08/01/20 1211 08/01/20 2115 08/02/20 0033  BP: (!) 142/73 (!) 105/59 134/69 114/71  Pulse: 64  73 63  Resp:   20 16  Temp:  98 F (36.7 C) 98 F (36.7 C) 98.3 F (36.8 C)  TempSrc:   Oral Oral  SpO2:   100% 97%  Weight:        Intake/Output Summary (Last 24 hours) at 08/02/2020 0743 Last data filed at 08/02/2020 0033 Gross per 24 hour  Intake 360 ml  Output 500 ml  Net -140 ml   Filed Weights   07/24/20 1615 07/25/20 0604 07/26/20 1540  Weight: (!) 172.8 kg (!) 172.6 kg (!) 166.3 kg   Examination:  General: 40 y.o. male resting in bed in NAD Eyes: PERRL, normal sclera ENMT: Nares patent  w/o discharge, orophaynx clear, dentition normal, ears w/o discharge/lesions/ulcers Neck: Supple, trachea midline Cardiovascular: RRR, +S1, S2, no m/g/r, equal pulses throughout Respiratory: CTABL, no w/r/r, normal WOB GI: BS+, NDNT, no masses noted, no organomegaly noted Neuro: Alert, following commands, RUE/RLE profound weakness/diminished sensation  Data Reviewed: I have personally reviewed following labs and imaging studies  CBC: Recent Labs  Lab 07/29/20 0302  WBC 8.7  NEUTROABS 5.4  HGB 13.3  HCT 37.2*   MCV 87.3  PLT 152   Basic Metabolic Panel: Recent Labs  Lab 07/29/20 0302 07/30/20 0450  NA 141 137  K 3.3* 3.6  CL 105 100  CO2 26 26  GLUCOSE 160* 169*  BUN 14 16  CREATININE 0.97 0.96  CALCIUM 9.0 9.2  MG 1.8 1.9   GFR: Estimated Creatinine Clearance: 167.3 mL/min (by C-G formula based on SCr of 0.96 mg/dL).   Liver Function Tests: Recent Labs  Lab 07/29/20 0302 07/30/20 0450  AST 26 29  ALT 46* 51*  ALKPHOS 41 40  BILITOT 1.0 1.0  PROT 6.3* 6.3*  ALBUMIN 3.0* 2.9*   No results for input(s): LIPASE, AMYLASE in the last 168 hours. No results for input(s): AMMONIA in the last 168 hours.  Coagulation Profile: No results for input(s): INR, PROTIME in the last 168 hours. Cardiac Enzymes: No results for input(s): CKTOTAL, CKMB, CKMBINDEX, TROPONINI in the last 168 hours.  BNP (last 3 results) No results for input(s): PROBNP in the last 8760 hours.  HbA1C: No results for input(s): HGBA1C in the last 72 hours.  CBG: Recent Labs  Lab 08/01/20 1202 08/01/20 1519 08/01/20 2109 08/01/20 2326 08/02/20 0414  GLUCAP 174* 153* 143* 138* 172*   Lipid Profile: No results for input(s): CHOL, HDL, LDLCALC, TRIG, CHOLHDL, LDLDIRECT in the last 72 hours.  Thyroid Function Tests: No results for input(s): TSH, T4TOTAL, FREET4, T3FREE, THYROIDAB in the last 72 hours.  Anemia Panel: No results for input(s): VITAMINB12, FOLATE, FERRITIN, TIBC, IRON, RETICCTPCT in the last 72 hours.  Sepsis Labs: No results for input(s): PROCALCITON, LATICACIDVEN in the last 168 hours. Recent Results (from the past 240 hour(s))  MRSA PCR Screening     Status: None   Collection Time: 07/25/20  2:14 AM   Specimen: Nasal Mucosa; Nasopharyngeal  Result Value Ref Range Status   MRSA by PCR NEGATIVE NEGATIVE Final    Comment:        The GeneXpert MRSA Assay (FDA approved for NASAL specimens only), is one component of a comprehensive MRSA colonization surveillance program. It is  not intended to diagnose MRSA infection nor to guide or monitor treatment for MRSA infections. Performed at [facility], [address], [place], [place] [address]   Blood Culture (routine x 2)     Status: None   Collection Time: 07/25/20  5:15 AM   Specimen: BLOOD RIGHT HAND  Result Value Ref Range Status   Specimen Description BLOOD RIGHT HAND  Final   Special Requests   Final    BOTTLES DRAWN AEROBIC ONLY Blood Culture adequate volume   Culture   Final    NO GROWTH 5 DAYS Performed at [facility], [address], [place], [place] [address]    Report Status 07/30/2020 FINAL  Final  Blood Culture (routine x 2)     Status: None   Collection Time: 07/25/20  5:15 AM   Specimen: BLOOD RIGHT HAND  Result Value Ref Range Status   Specimen Description BLOOD RIGHT HAND  Final   Special Requests   Final    BOTTLES DRAWN AEROBIC ONLY Blood Culture adequate volume   Culture   Final    NO GROWTH 5 DAYS Performed at [facility], [address], [place], [place] [address]    Report Status 07/30/2020 FINAL  Final    Radiology Studies: No results found.  Scheduled Meds: . vitamin C  500 mg Oral Daily  . aspirin  300 mg Rectal Daily   Or  . aspirin  325 mg Oral Daily  . Chlorhexidine Gluconate Cloth  6 each Topical Q0600  . cholecalciferol  1,000 Units Oral QPM  . cloNIDine  0.2 mg Oral BID  . feeding supplement  237 mL Oral BID BM  . heparin injection (subcutaneous)  7,500 Units Subcutaneous Q8H  . hydrochlorothiazide  25 mg Oral Daily  . insulin aspart  0-15 Units Subcutaneous Q4H  . irbesartan  300 mg Oral Daily  . mouth rinse  15 mL Mouth Rinse BID  . meloxicam  15 mg Oral Daily  . metFORMIN  1,000 mg Oral BID WC  . metoprolol tartrate  100 mg Oral BID  . multivitamin with minerals  1 tablet Oral Daily  . pantoprazole sodium  40 mg Oral Daily  . potassium chloride  40 mEq Oral Daily  . rosuvastatin  10 mg Oral Daily  . sertraline  25 mg Oral Daily  .  sodium chloride flush  3 mL Intravenous Once  . zinc sulfate  220 mg Oral QPM    LOS: 9 days   Time spent: 25 minutes  Azucena Fallen, DO [facility]  If 7PM-7AM, please contact night-coverage [email or web address] 08/02/2020, 7:43 AM

## 2020-08-02 NOTE — Plan of Care (Signed)
  Problem: Education: Goal: Knowledge of General Education information will improve Description: Including pain rating scale, medication(s)/side effects and non-pharmacologic comfort measures Outcome: Progressing   Problem: Health Behavior/Discharge Planning: Goal: Ability to manage health-related needs will improve Outcome: Progressing   Problem: Clinical Measurements: Goal: Ability to maintain clinical measurements within normal limits will improve Outcome: Progressing Goal: Will remain free from infection Outcome: Progressing Goal: Diagnostic test results will improve Outcome: Progressing Goal: Respiratory complications will improve Outcome: Progressing Goal: Cardiovascular complication will be avoided Outcome: Progressing   Problem: Activity: Goal: Risk for activity intolerance will decrease Outcome: Progressing   Problem: Nutrition: Goal: Adequate nutrition will be maintained Outcome: Progressing   Problem: Coping: Goal: Level of anxiety will decrease Outcome: Progressing   Problem: Elimination: Goal: Will not experience complications related to bowel motility Outcome: Progressing Goal: Will not experience complications related to urinary retention Outcome: Progressing   Problem: Pain Managment: Goal: General experience of comfort will improve Outcome: Progressing   Problem: Safety: Goal: Ability to remain free from injury will improve Outcome: Progressing   Problem: Skin Integrity: Goal: Risk for impaired skin integrity will decrease Outcome: Progressing   Problem: Education: Goal: Knowledge of disease or condition will improve Outcome: Progressing Goal: Knowledge of secondary prevention will improve Outcome: Progressing Goal: Knowledge of patient specific risk factors addressed and post discharge goals established will improve Outcome: Progressing Goal: Individualized Educational Video(s) Outcome: Progressing   Problem: Coping: Goal: Will verbalize  positive feelings about self Outcome: Progressing Goal: Will identify appropriate support needs Outcome: Progressing   Problem: Health Behavior/Discharge Planning: Goal: Ability to manage health-related needs will improve Outcome: Progressing   Problem: Self-Care: Goal: Ability to participate in self-care as condition permits will improve Outcome: Progressing Goal: Verbalization of feelings and concerns over difficulty with self-care will improve Outcome: Progressing Goal: Ability to communicate needs accurately will improve Outcome: Progressing   Problem: Nutrition: Goal: Risk of aspiration will decrease Outcome: Progressing Goal: Dietary intake will improve Outcome: Progressing   Problem: Ischemic Stroke/TIA Tissue Perfusion: Goal: Complications of ischemic stroke/TIA will be minimized Outcome: Progressing   Problem: Safety: Goal: Non-violent Restraint(s) Outcome: Progressing   

## 2020-08-03 DIAGNOSIS — I639 Cerebral infarction, unspecified: Secondary | ICD-10-CM | POA: Diagnosis not present

## 2020-08-03 LAB — GLUCOSE, CAPILLARY
Glucose-Capillary: 149 mg/dL — ABNORMAL HIGH (ref 70–99)
Glucose-Capillary: 155 mg/dL — ABNORMAL HIGH (ref 70–99)
Glucose-Capillary: 118 mg/dL — ABNORMAL HIGH (ref 70–99)
Glucose-Capillary: 163 mg/dL — ABNORMAL HIGH (ref 70–99)

## 2020-08-03 NOTE — Plan of Care (Signed)
  Problem: Education: Goal: Knowledge of General Education information will improve Description: Including pain rating scale, medication(s)/side effects and non-pharmacologic comfort measures Outcome: Progressing   Problem: Health Behavior/Discharge Planning: Goal: Ability to manage health-related needs will improve Outcome: Progressing   Problem: Clinical Measurements: Goal: Ability to maintain clinical measurements within normal limits will improve Outcome: Progressing Goal: Will remain free from infection Outcome: Progressing Goal: Diagnostic test results will improve Outcome: Progressing Goal: Respiratory complications will improve Outcome: Progressing Goal: Cardiovascular complication will be avoided Outcome: Progressing   Problem: Activity: Goal: Risk for activity intolerance will decrease Outcome: Progressing   Problem: Nutrition: Goal: Adequate nutrition will be maintained Outcome: Progressing   Problem: Coping: Goal: Level of anxiety will decrease Outcome: Progressing   Problem: Elimination: Goal: Will not experience complications related to bowel motility Outcome: Progressing Goal: Will not experience complications related to urinary retention Outcome: Progressing   Problem: Pain Managment: Goal: General experience of comfort will improve Outcome: Progressing   Problem: Safety: Goal: Ability to remain free from injury will improve Outcome: Progressing   Problem: Skin Integrity: Goal: Risk for impaired skin integrity will decrease Outcome: Progressing   Problem: Education: Goal: Knowledge of disease or condition will improve Outcome: Progressing Goal: Knowledge of secondary prevention will improve Outcome: Progressing Goal: Knowledge of patient specific risk factors addressed and post discharge goals established will improve Outcome: Progressing Goal: Individualized Educational Video(s) Outcome: Progressing   Problem: Coping: Goal: Will verbalize  positive feelings about self Outcome: Progressing Goal: Will identify appropriate support needs Outcome: Progressing   Problem: Health Behavior/Discharge Planning: Goal: Ability to manage health-related needs will improve Outcome: Progressing   Problem: Self-Care: Goal: Ability to participate in self-care as condition permits will improve Outcome: Progressing Goal: Verbalization of feelings and concerns over difficulty with self-care will improve Outcome: Progressing Goal: Ability to communicate needs accurately will improve Outcome: Progressing   Problem: Nutrition: Goal: Risk of aspiration will decrease Outcome: Progressing Goal: Dietary intake will improve Outcome: Progressing   Problem: Ischemic Stroke/TIA Tissue Perfusion: Goal: Complications of ischemic stroke/TIA will be minimized Outcome: Progressing   Problem: Safety: Goal: Non-violent Restraint(s) Outcome: Progressing   

## 2020-08-03 NOTE — PMR Pre-admission (Signed)
PMR Admission Coordinator Pre-Admission Assessment  Patient: Justin Mason is an 40 y.o., male MRN: [id number] DOB: 02/08/1981 Height:   Weight: (!) 159.1 kg              Insurance Information HMO:     PPO: yes     PCP:      IPA:      80/20:      OTHER:  PRIMARYLandis Mason      Policy#: [id number]     Subscriber: Pt.  CM Name: Butch Penny      Phone#: [phone]     Fax#: [phone] Received insurance authorization from [place] for 12 days for admission 2/2. Clinical updates are due 2/7 Pre-Cert#: [id number]      Employer:  Benefits:  Phone #:  [email or web address]     Name: Barbette Hair Date: 06/06/2019 06/04/2021 Deductible: $5,500 (2,981.46 met) OOP Max: $8,550 (includes deductible - $2.208.54) CIR: 70% coverage, 30% co-insurance SNF: 70% coverage, 30% co-insurance; auth required Outpatient: $75 per visit, auth not required Home Health:  60 visits per service year DME: 100% coverage Providers:  In network    SECONDARY:      Policy#:       Phone#:   Development worker, community:       Phone#:   The Engineer, [employer]" for patients in Inpatient Rehabilitation Facilities with attached "Privacy Act [place] Records" was provided and verbally reviewed with: Patient  Emergency Contact Information Contact Information    Name Relation Home Work Mobile   Justin Mason Mother [phone]     Illene Regulus  [phone]  [phone]   Justin Mason, Justin Mason Father [phone]  [phone]     Current Medical History  Patient Admitting Diagnosis: CVA History of Present Illness: 40 yo M PMH morbid obesity, DM2, HLD, HTN, known COVID-19 infection (dx less than 1 week PTA) who presented to ED 1/19 with aphasia and RUE weakness beginning at about 1030 AM 07/24/2020 and progressing until 1230 same day with R facial droop. CT H with concern for L sided infarcts CTA with short segment occlusion of R M1/MCA, L ICA occlusion, proximal L M1, stenosis of PCAs. Outside of window for  tpa. Went to [facility]for rev[facility] but angioplasty of MCA unsuccessful due to resistance.  Complete NIHSS TOTAL: 15 Glasgow Coma Scale Score: 15  Past Medical History  Past Medical History:  Diagnosis Date  . Arthritis   . Cardiac murmur 05/24/2020  . Cellulitis 01/03/2015  . Cellulitis of right leg   . Cellulitis of right lower extremity 01/03/2015  . Essential hypertension   . GERD (gastroesophageal reflux disease)   . High cholesterol   . Hyperlipemia   . Hypertension   . Morbid obesity ([place]) 05/24/2020  . Rotator cuff tear, right 09/08/2011  . Sepsis ([place]) 01/03/2015  . Type 2 diabetes mellitus with hyperglycemia (HCC)     Family History  family history includes Anesthesia problems in his sister; Colon cancer in an other family member; Prostate cancer in an other family member.  Prior Rehab/Hospitalizations:  Has the patient had prior rehab or hospitalizations prior to admission? No  Has the patient had major surgery during 100 days prior to admission? Yes  Current Medications   Current Facility-Administered Medications:  .  0.9 %  sodium chloride infusion, 250 mL, Intravenous, Continuous, Agarwala, Ravi, MD .  acetaminophen (TYLENOL) tablet 650 mg, 650 mg, Oral, Q4H PRN **OR** acetaminophen (TYLENOL) 160 MG/5ML solution 650 mg, 650 mg, Per Tube, Q4H PRN **OR** acetaminophen (TYLENOL) suppository  650 mg, 650 mg, Rectal, Q4H PRN, Agarwala, Ravi, MD, 650 mg at 07/26/20 0037 .  allopurinol (ZYLOPRIM) tablet 100 mg, 100 mg, Oral, Daily PRN, Agarwala, Ravi, MD .  ascorbic acid (VITAMIN C) tablet 500 mg, 500 mg, Oral, Daily, Greta Doom, MD, 500 mg at 08/09/20 0830 .  aspirin suppository 300 mg, 300 mg, Rectal, Daily, 300 mg at 07/26/20 1059 **OR** aspirin tablet 325 mg, 325 mg, Oral, Daily, Agarwala, Ravi, MD, 325 mg at 08/09/20 7544 .  cholecalciferol (VITAMIN D3) tablet 1,000 Units, 1,000 Units, Oral, QPM, Kipp Brood, MD, 1,000 Units at 08/08/20 1822 .   cloNIDine (CATAPRES) tablet 0.2 mg, 0.2 mg, Oral, BID, [name], MD, 0.2 mg at 08/09/20 0829 .  feeding supplement (ENSURE ENLIVE / ENSURE PLUS) liquid 237 mL, 237 mL, Oral, BID BM, Greta Doom, MD, 237 mL at 08/09/20 [phone] .  fentaNYL (SUBLIMAZE) injection 50 mcg, 50 mcg, Intravenous, Q15 min PRN, Agarwala, Ravi, MD .  fentaNYL (SUBLIMAZE) injection 50-200 mcg, 50-200 mcg, Intravenous, Q30 min PRN, Agarwala, Ravi, MD .  heparin injection 7,500 Units, 7,500 Units, Subcutaneous, Q8H, Agarwala, Ravi, MD, 7,500 Units at 08/09/20 0616 .  hydrochlorothiazide (HYDRODIURIL) tablet 25 mg, 25 mg, Oral, Daily, Greta Doom, MD, 25 mg at 08/09/20 0829 .  insulin aspart (novoLOG) injection 0-15 Units, 0-15 Units, Subcutaneous, TID AC & HS, [name], MD, 3 Units at 08/09/20 [phone] .  irbesartan (AVAPRO) tablet 300 mg, 300 mg, Oral, Daily, Greta Doom, MD, 300 mg at 08/09/20 0830 .  labetalol (NORMODYNE) injection 5 mg, 5 mg, Intravenous, Q10 min PRN, Kipp Brood, MD .  MEDLINE mouth rinse, 15 mL, Mouth Rinse, BID, Agarwala, Ravi, MD, 15 mL at 08/09/20 0838 .  meloxicam (MOBIC) tablet 15 mg, 15 mg, Oral, Daily, Greta Doom, MD, 15 mg at 08/09/20 2197 .  metFORMIN (GLUCOPHAGE) tablet 1,000 mg, 1,000 mg, Oral, BID WC, Greta Doom, MD, 1,000 mg at 08/09/20 0830 .  metoprolol tartrate (LOPRESSOR) tablet 100 mg, 100 mg, Oral, BID, Agarwala, Ravi, MD, 100 mg at 08/09/20 0830 .  multivitamin with minerals tablet 1 tablet, 1 tablet, Oral, Daily, Greta Doom, MD, 1 tablet at 08/09/20 0831 .  pantoprazole sodium (PROTONIX) 40 mg/20 mL oral suspension 40 mg, 40 mg, Oral, Daily, Greta Doom, MD, 40 mg at 08/09/20 0831 .  potassium chloride (KLOR-CON) packet 40 mEq, 40 mEq, Oral, Daily, Kyle, Tyrone A, DO, 40 mEq at 08/09/20 0829 .  rosuvastatin (CRESTOR) tablet 10 mg, 10 mg, Oral, Daily, Agarwala, Ravi, MD, 10 mg at 08/09/20  0829 .  sertraline (ZOLOFT) tablet 25 mg, 25 mg, Oral, Daily, Dagar, Anjali, MD, 25 mg at 08/09/20 0831 .  zinc sulfate capsule 220 mg, 220 mg, Oral, QPM, Agarwala, Ravi, MD, 220 mg at 08/08/20 1822  Patients Current Diet:  Diet Order            Diet - low sodium heart healthy           Diet Carb Modified Fluid consistency: Thin; Room service appropriate? No  Diet effective now                 Precautions / Restrictions Precautions Precautions: Fall Precaution Comments: R inattention, R side flaccid Restrictions Weight Bearing Restrictions: No   Has the patient had 2 or more falls or a fall with injury in the past year?No  Prior Activity Level Community (5-7x/wk): Pt. was active in the community, working PTA  Prior Functional Level Prior Function Level of Independence: Independent Comments: Pt is a pipe fitter and welder (heavy lifting, pipe carrying, HVAC unit repair). Information obtained from calling pt's mother, Justin Mason  Self Care: Did the patient need help bathing, dressing, using the toilet or eating?  Independent  Indoor Mobility: Did the patient need assistance with walking from room to room (with or without device)? Independent  Stairs: Did the patient need assistance with internal or external stairs (with or without device)? Independent  Functional Cognition: Did the patient need help planning regular tasks such as shopping or remembering to take medications? Independent  Home Assistive Devices / Equipment Home Equipment: None  Prior Device Use: Indicate devices/aids used by the patient prior to current illness, exacerbation or injury? None of the above  Current Functional Level Cognition  Arousal/Alertness: Awake/alert Overall Cognitive Status: Difficult to assess Difficult to assess due to: Impaired communication (globally aphasic, receptive improving) Orientation Level: Oriented to person,Oriented to place,Oriented to situation Following Commands:  Follows one step commands with increased time,Follows one step commands consistently Safety/Judgement: Decreased awareness of safety,Decreased awareness of deficits General Comments: pt following all commands and nodding appropriately during session, however pt making no effort to verbalize during session. Urine and bowel incontinence in stedy this date with pt acknowledgeing he was unaware of need to use restroom. Attention: Sustained Sustained Attention: Impaired Memory:  (will asssess as able) Awareness: Impaired Awareness Impairment: Emergent impairment Problem Solving: Impaired Behaviors: Lability Safety/Judgment: Impaired    Extremity Assessment (includes Sensation/Coordination)  Upper Extremity Assessment: RUE deficits/detail RUE Deficits / Details: flaccid RUE Sensation: decreased proprioception,decreased light touch RUE Coordination: decreased gross motor,decreased fine motor  Lower Extremity Assessment: Defer to PT evaluation RLE Deficits / Details: Hypertonicity of plantarflexors, hip extensors. 2-/5 knee extension (sitting), 1/5 knee flexion (supine), 1/5 toe flex/ext RLE Sensation:  (per pt report during screen, unsure of accuracy) RLE Coordination: decreased fine motor,decreased gross motor    ADLs  Overall ADL's : Needs assistance/impaired Eating/Feeding: Set up,Sitting Eating/Feeding Details (indicate cue type and reason): able to bring cup to mouth with LUE wit set- up assist Grooming: Wash/dry hands,Applying deodorant,Minimal assistance,Sitting Grooming Details (indicate cue type and reason): Patient able to wash face seated in recliner with set-up assist. Would likely require Min A for oral hygiene 2/2 flaccid paralysis in RUE. Upper Body Bathing: Moderate assistance,Sitting (progressing to Min A) Upper Body Bathing Details (indicate cue type and reason): Patient able to wash RUE, chest and abdomen. Cues for hemi technique to wash LUE seated in recliner. Lower Body  Bathing: Total assistance,Sit to/from stand Lower Body Bathing Details (indicate cue type and reason): total A for posterior pericare d/t incontinent bladder Upper Body Dressing : Moderate assistance,Sitting Upper Body Dressing Details (indicate cue type and reason): Education on hemi technique and Mod A to don anterior hospital gown. Lower Body Dressing: Total assistance Toilet Transfer: Moderate assistance,+2 for physical assistance,Total assistance Toilet Transfer Details (indicate cue type and reason): MOD A +2 to stand to stedy, total A with stedy to pivot to recliner Toileting- Clothing Manipulation and Hygiene: Total assistance,Sit to/from stand Toileting - Clothing Manipulation Details (indicate cue type and reason): Total A for hygiene management in standing with use of Stedy. Functional mobility during ADLs: Moderate assistance,+2 for physical assistance (sit<>stand to stedy) General ADL Comments: pt continues to make excellent progress able to complete seated grooming tasks using RUE as stabilizer needing total A hand over hand assist to maintain functional grasp on toothbrush. pt completed multiple sit<>stands  from stedy with MOD A +2. pt continues to present with impaired balance, however utilized mirror for visual feedback to assist with maintaining balance. pt with good carryover    Mobility  Overal bed mobility: Needs Assistance Bed Mobility: Rolling,Sidelying to Sit,Sit to Supine Rolling: Min assist,Mod assist Sidelying to sit: Min assist,HOB elevated Supine to sit: Mod assist,HOB elevated,+2 for physical assistance Sit to supine: Mod assist General bed mobility comments: Pt able to roll to the L with use of bed rails and AAROM at R arm to bring across body with placement of R foot on bed and cues to push, with minA. Cues to bring knees to chest with minA and to transition onto L shoulder > elbow > hand to ascend trunk with some assistance. Return to supine with modA at legs.     Transfers  Overall transfer level: Needs assistance Equipment used: Ambulation equipment used Transfer via Lift Equipment: Stedy Transfers: Sit to/from Stand Sit to Stand: Mod assist Stand pivot transfers: Total assist (sara stedy)  Lateral/Scoot Transfers: Mod assist,+2 physical assistance General transfer comment: pt performed sit>stand 1x from bed to stedy with modA and 3x from stedy with minA cuing pt to focus on maintaining midline on the way up. Tactile cues at R quad to extend.    Ambulation / Gait / Stairs / Wheelchair Mobility  Ambulation/Gait General Gait Details: pregait training x2 bouts of ~2-3 min each in stedy with wt shifting with tactile cues and block at R knee and gluts, min guard when standing midline and up to minA when shifting to R. R quad muscle activation noted this date, cuing pt to push with R leg to shift to L, success.    Posture / Balance Dynamic Sitting Balance Sitting balance - Comments: 1 UE support with no feet support to sit statically EOB Balance Overall balance assessment: Needs assistance Sitting-balance support: Single extremity supported,Feet unsupported Sitting balance-Leahy Scale: Poor Sitting balance - Comments: 1 UE support with no feet support to sit statically EOB Postural control: Right lateral lean Standing balance support: Bilateral upper extremity supported,During functional activity Standing balance-Leahy Scale: Poor Standing balance comment: heavy reliance on LUE support and external support. Placing weight through R hand on stedy during standing bouts.    Special needs/care consideration Skin Serous Blisters on arm and back bilaterally. Ecchymosis to the abdomen, Excoriation of Coccyx, Rash on the lower abdomen, Diabetic management: Pt. On Metformin and sQ Novolog , Special service needs: may need some bariatric equipment but not in bari bed.     Previous Home Environment (from acute therapy documentation) Living Arrangements:  Parent  Lives With: Family Available Help at Discharge: Family,Available 24 hours/day Type of Home: House Home Layout: One level Home Access: Stairs to enter [employer] of Steps: 4 Bathroom Shower/Tub: Chiropodist: Standard Bathroom Accessibility: Yes How Accessible: Accessible via walker [place]: No  Discharge Living Setting Plans for Discharge Living Setting: Patient's home Type of Home at Discharge: House Discharge Home Layout: One level Discharge Home Access: Stairs to enter Vill[place]: None Entrance Stairs-Number of Steps: 4 Discharge Bathroom Shower/Tub: Tub/shower unit Discharge Bathroom Toilet: Standard Discharge Bathroom Accessibility: Yes How Accessible: Accessible via walker Does the patient have any problems obtaining your medications?: No  Social/Family/Support Systems Patient Roles: Parent Contact Information: [phone] Anticipated Caregiver: Justin Mason (mother) Pt. Lives with his mother and she will be home with him. She reports that pt. Has 2 adult siblings who can also help. Anticipated Caregiver's Contact  Information: [phone] Ability/Limitations of Caregiver: Can provide min-mod A Caregiver Availability: 24/7 Discharge Plan Discussed with Primary Caregiver: Yes Is Caregiver In Agreement with Plan?: Yes Does Caregiver/Family have Issues with Lodging/Transportation while Pt is in Rehab?: No   Goals Patient/Family Goal for Rehab: PT/OT/SLP Supervision/Min A Expected length of stay: 16-19 days Pt/Family Agrees to Admission and willing to participate: Yes Program Orientation Provided & Reviewed with Pt/Caregiver Including Roles  & Responsibilities: Yes   Decrease burden of Care through IP rehab admission: Specialzed equipment needs, Decrease number of caregivers, Bowel and bladder program and Patient/family education   Possible need for SNF placement upon discharge:not anticiapted   Patient  Condition: This patient's medical and functional status has changed since the consult dated: 08/05/20 in which the Rehabilitation Physician determined and documented that the patient's condition is appropriate for intensive rehabilitative care in an inpatient rehabilitation facility. See "History of Present Illness" (above) for medical update. Functional changes are: Pt currently Min-Mod A with bed mobility, Mod A with stedy for transfers and Mod A with upper body ADLs. Patient's medical and functional status update has been discussed with the Rehabilitation physician and patient remains appropriate for inpatient rehabilitation. Will admit to inpatient rehab today.  Preadmission Screen Completed By:  Farley Ly. Staley, with day of admission updates by Bethel Born, CCC-SLP, 08/09/2020 11:42 AM ______________________________________________________________________   Discussed status with Dr. Posey Pronto on 08/09/20  at 11:42 AM and received approval for admission today.  Admission Coordinator: Farley Ly. Staley, with day of admission updates by Bethel Born, time 11:42 AM  Sudie Grumbling 08/09/20

## 2020-08-03 NOTE — Progress Notes (Signed)
PROGRESS NOTE    Justin Mason  FFM:[id number] DOB: 12-Oct-1980 DOA: 07/24/2020 PCP: Eber Jones, NP   Brief Narrative:   40 yo M PMH morbid obesity, DM2, HLD, HTN, known COVID-19 infection (dx less than 1 week PTA) who presented to ED 1/19 with aphasia and RUE weakness beginning at about 1030 AM 07/24/2020 and progressing until 1230 same day with R facial droop. CT H with concern for L sided infarcts CTA with short segment occlusion of R M1/MCA, L ICA occlusion, proximal L M1, stenosis of PCAs. Outside of window for tpa. Went to [facility]1/19/22for revascularization but angioplasty of MCA unsuccessful due to resistance. Patient continues to progress daily with speech and PT - on covid precautions through 08/02/20 which limits disposition. Hopefully now that off covid (as of 08/03/20) he will be able to dispo to CIR vs SNF given ongoing needs.  Assessment & Plan:  L ICA occlusion, severe R MCA stenosis - Presenting with right side weakness and aphasia - Unsuccessful revascularization due to chronicity - Cause of decompensation of chronic disease unclear - possible hypoglycemic episode.  - Neuro eval'd: DAPT for 3 months then transition to single agent - Continue statin - SLP following - diet improving - back to regular/thins currently - Liquid Administration via Cup;Straw - Medication Administration Crushed with puree - Compensations:Slow rate; Minimize environmental distractions; Small sips/bites; Lingual sweep for clearance of pocketing - Postural Changes:Seated upright at 90 degrees - 1/25: Continuing to work with rehab services; still working on placement (limited due to previous covid/quarantine precautions).  Incidental covid positive - Acute respiratory failure post procedure - Hypoxia due to procedure; resolved - No current indication for COVID treatment   - Off quarantine 08/03/20 - 1/25: Stable on RA  Hypertension.  - Completed permissive HTN window - HCTZ, irbesartan,  clonidine, metoprolol - BP stable - continue to follow  Hypokalemia - Resolved - no longer following given improved PO intake  DVT prophylaxis: Heparin Code Status: FULL Family Communication: Mother updated at length about prognosis/disposition Status is: Inpatient  Remains inpatient appropriate because:Inpatient level of care appropriate due to severity of illness  Dispo: The patient is from: Home             Anticipated d/c is to: SNF vs CIR             Anticipated d/c date is: 24-48h             Patient currently is medically stable to d/c.  Difficult to place patient No  Consultants:   Neurology  Subjective: No acute events overnight - still poorly verbal but smiling this am and much more pleasant/hopeful than last week.  Objective: Vitals:   08/02/20 1116 08/02/20 1612 08/02/20 2012 08/02/20 2328  BP: 134/78 108/63  129/79  Pulse: 85 80  73  Resp: 16 16  15   Temp: 98.8 F (37.1 C) 98.3 F (36.8 C) 98.3 F (36.8 C) 98.2 F (36.8 C)  TempSrc: Oral Oral Oral Oral  SpO2: 96% 100%  100%  Weight:        Intake/Output Summary (Last 24 hours) at 08/03/2020 0733 Last data filed at 08/03/2020 08/05/2020 Gross per 24 hour  Intake --  Output 1950 ml  Net -1950 ml   Filed Weights   07/24/20 1615 07/25/20 0604 07/26/20 1540  Weight: (!) 172.8 kg (!) 172.6 kg (!) 166.3 kg   Examination:  General: 40 y.o. male resting in bed in NAD Eyes: PERRL, normal sclera ENMT: Nares patent  w/o discharge, orophaynx clear, dentition normal, ears w/o discharge/lesions/ulcers Neck: Supple, trachea midline Cardiovascular: RRR, +S1, S2, no m/g/r, equal pulses throughout Respiratory: CTABL, no w/r/r, normal WOB GI: BS+, NDNT, no masses noted, no organomegaly noted Neuro: Alert, following commands, RUE/RLE profound weakness/diminished sensation  Data Reviewed: I have personally reviewed following labs and imaging studies  CBC: Recent Labs  Lab 07/29/20 0302  WBC 8.7  NEUTROABS 5.4   HGB 13.3  HCT 37.2*  MCV 87.3  PLT 152   Basic Metabolic Panel: Recent Labs  Lab 07/29/20 0302 07/30/20 0450  NA 141 137  K 3.3* 3.6  CL 105 100  CO2 26 26  GLUCOSE 160* 169*  BUN 14 16  CREATININE 0.97 0.96  CALCIUM 9.0 9.2  MG 1.8 1.9   GFR: Estimated Creatinine Clearance: 167.3 mL/min (by C-G formula based on SCr of 0.96 mg/dL).   Liver Function Tests: Recent Labs  Lab 07/29/20 0302 07/30/20 0450  AST 26 29  ALT 46* 51*  ALKPHOS 41 40  BILITOT 1.0 1.0  PROT 6.3* 6.3*  ALBUMIN 3.0* 2.9*   No results for input(s): LIPASE, AMYLASE in the last 168 hours. No results for input(s): AMMONIA in the last 168 hours.  Coagulation Profile: No results for input(s): INR, PROTIME in the last 168 hours. Cardiac Enzymes: No results for input(s): CKTOTAL, CKMB, CKMBINDEX, TROPONINI in the last 168 hours.  BNP (last 3 results) No results for input(s): PROBNP in the last 8760 hours.  HbA1C: No results for input(s): HGBA1C in the last 72 hours.  CBG: Recent Labs  Lab 08/02/20 1123 08/02/20 1537 08/02/20 2009 08/02/20 2318 08/03/20 0613  GLUCAP 133* 149* 150* 137* 149*   Lipid Profile: No results for input(s): CHOL, HDL, LDLCALC, TRIG, CHOLHDL, LDLDIRECT in the last 72 hours.  Thyroid Function Tests: No results for input(s): TSH, T4TOTAL, FREET4, T3FREE, THYROIDAB in the last 72 hours.  Anemia Panel: No results for input(s): VITAMINB12, FOLATE, FERRITIN, TIBC, IRON, RETICCTPCT in the last 72 hours.  Sepsis Labs: No results for input(s): PROCALCITON, LATICACIDVEN in the last 168 hours. Recent Results (from the past 240 hour(s))  MRSA PCR Screening     Status: None   Collection Time: 07/25/20  2:14 AM   Specimen: Nasal Mucosa; Nasopharyngeal  Result Value Ref Range Status   MRSA by PCR NEGATIVE NEGATIVE Final    Comment:        The GeneXpert MRSA Assay (FDA approved for NASAL specimens only), is one component of a comprehensive MRSA  colonization surveillance program. It is not intended to diagnose MRSA infection nor to guide or monitor treatment for MRSA infections. Performed at [facility], [address], [place], [place] [address]   Blood Culture (routine x 2)     Status: None   Collection Time: 07/25/20  5:15 AM   Specimen: BLOOD RIGHT HAND  Result Value Ref Range Status   Specimen Description BLOOD RIGHT HAND  Final   Special Requests   Final    BOTTLES DRAWN AEROBIC ONLY Blood Culture adequate volume   Culture   Final    NO GROWTH 5 DAYS Performed at [facility], [address], [place], [place] [address]    Report Status 07/30/2020 FINAL  Final  Blood Culture (routine x 2)     Status: None   Collection Time: 07/25/20  5:15 AM   Specimen: BLOOD RIGHT HAND  Result Value Ref Range Status   Specimen Description BLOOD RIGHT HAND  Final   Special Requests   Final    BOTTLES DRAWN AEROBIC ONLY Blood Culture adequate volume   Culture   Final    NO GROWTH 5 DAYS Performed at [facility], [address], [place], [place] [address]    Report Status 07/30/2020 FINAL  Final    Radiology Studies: No results found.  Scheduled Meds: . vitamin C  500 mg Oral Daily  . aspirin  300 mg Rectal Daily   Or  . aspirin  325 mg Oral Daily  . Chlorhexidine Gluconate Cloth  6 each Topical Q0600  . cholecalciferol  1,000 Units Oral QPM  . cloNIDine  0.2 mg Oral BID  . feeding supplement  237 mL Oral BID BM  . heparin injection (subcutaneous)  7,500 Units Subcutaneous Q8H  . hydrochlorothiazide  25 mg Oral Daily  . insulin aspart  0-15 Units Subcutaneous TID AC & HS  . irbesartan  300 mg Oral Daily  . mouth rinse  15 mL Mouth Rinse BID  . meloxicam  15 mg Oral Daily  . metFORMIN  1,000 mg Oral BID WC  . metoprolol tartrate  100 mg Oral BID  . multivitamin with minerals  1 tablet Oral Daily  . pantoprazole sodium  40 mg Oral Daily  . potassium chloride  40 mEq Oral Daily  . rosuvastatin  10  mg Oral Daily  . sertraline  25 mg Oral Daily  . sodium chloride flush  3 mL Intravenous Once  . zinc sulfate  220 mg Oral QPM    LOS: 10 days   Time spent: 25 minutes  Azucena Fallen, DO [facility]  If 7PM-7AM, please contact night-coverage [email or web address] 08/03/2020, 7:33 AM

## 2020-08-03 NOTE — Progress Notes (Addendum)
Inpatient Rehab Admissions Coordinator:   Met with patient at bedside to discuss potential CIR admission. Pt. Stated interest and his mother confirmed that family can provide 24/7 support at d/c. Will pursue for potential admit next week, pending bed availability and insurance authorization.   Clemens Catholic, [place], [place] Admissions Coordinator  [phone] ([place]) [phone] (office)

## 2020-08-04 DIAGNOSIS — I639 Cerebral infarction, unspecified: Secondary | ICD-10-CM | POA: Diagnosis not present

## 2020-08-04 LAB — GLUCOSE, CAPILLARY
Glucose-Capillary: 127 mg/dL — ABNORMAL HIGH (ref 70–99)
Glucose-Capillary: 134 mg/dL — ABNORMAL HIGH (ref 70–99)
Glucose-Capillary: 117 mg/dL — ABNORMAL HIGH (ref 70–99)
Glucose-Capillary: 171 mg/dL — ABNORMAL HIGH (ref 70–99)
Glucose-Capillary: 160 mg/dL — ABNORMAL HIGH (ref 70–99)

## 2020-08-04 NOTE — Progress Notes (Signed)
PROGRESS NOTE    Justin Mason  DVV:[id number] DOB: September 21, 1980 DOA: 07/24/2020 PCP: Eber Jones, NP   Brief Narrative:   40 yo M PMH morbid obesity, DM2, HLD, HTN, known COVID-19 infection (dx less than 1 week PTA) who presented to ED 1/19 with aphasia and RUE weakness beginning at about 1030 AM 07/24/2020 and progressing until 1230 same day with R facial droop. CT H with concern for L sided infarcts CTA with short segment occlusion of R M1/MCA, L ICA occlusion, proximal L M1, stenosis of PCAs. Outside of window for tpa. Went to [facility]1/19/22for revascularization but angioplasty of MCA unsuccessful due to resistance. Patient continues to progress daily with speech and PT - on covid precautions through 08/02/20 which limits disposition. Hopefully now that off covid (as of 08/03/20) he will be able to dispo to CIR vs SNF given ongoing needs.  Assessment & Plan:  L ICA occlusion, severe R MCA stenosis - Presenting with right side weakness and aphasia - Unsuccessful revascularization due to chronicity - Cause of decompensation of chronic disease unclear - possible hypoglycemic episode.  - Neuro eval'd: DAPT for 3 months then transition to single agent - Continue statin - SLP following - diet improving - back to regular/thins currently - Liquid Administration via Cup;Straw - Medication Administration Crushed with puree - Compensations:Slow rate; Minimize environmental distractions; Small sips/bites; Lingual sweep for clearance of pocketing - Postural Changes:Seated upright at 90 degrees - 1/25: Continuing to work with rehab services; still working on placement (limited due to previous covid/quarantine precautions).  Incidental covid positive - Acute respiratory failure post procedure - Hypoxia due to procedure; resolved - No current indication for COVID treatment   - Off quarantine 08/03/20 - 1/25: Stable on RA  Hypertension.  - Completed permissive HTN window - HCTZ, irbesartan,  clonidine, metoprolol - BP stable - continue to follow  Hypokalemia - Resolved - no longer following given improved PO intake  DVT prophylaxis: Heparin Code Status: FULL Family Communication: Mother updated at length about prognosis/disposition Status is: Inpatient  Remains inpatient appropriate because:Inpatient level of care appropriate due to severity of illness  Dispo: The patient is from: Home             Anticipated d/c is to: SNF vs CIR             Anticipated d/c date is: 24-48h             Patient currently is medically stable to d/c.  Difficult to place patient No  Consultants:   Neurology  Subjective: No acute events overnight - still poorly verbal but smiling this am and much more pleasant/hopeful than last week.  Objective: Vitals:   08/03/20 2327 08/03/20 2328 08/04/20 0041 08/04/20 0450  BP:    (!) 112/92  Pulse:   60 (!) 58  Resp:   15 16  Temp: 97.8 F (36.6 C) 97.8 F (36.6 C)  97.8 F (36.6 C)  TempSrc: Oral Oral  Oral  SpO2:    99%  Weight:        Intake/Output Summary (Last 24 hours) at 08/04/2020 0738 Last data filed at 08/04/2020 0451 Gross per 24 hour  Intake -  Output 200 ml  Net -200 ml   Filed Weights   07/24/20 1615 07/25/20 0604 07/26/20 1540  Weight: (!) 172.8 kg (!) 172.6 kg (!) 166.3 kg   Examination:  General: 40 y.o. male resting in bed in NAD Eyes: PERRL, normal sclera ENMT: Nares patent w/o  discharge, orophaynx clear, dentition normal, ears w/o discharge/lesions/ulcers Neck: Supple, trachea midline Cardiovascular: RRR, +S1, S2, no m/g/r, equal pulses throughout Respiratory: CTABL, no w/r/r, normal WOB GI: BS+, NDNT, no masses noted, no organomegaly noted Neuro: Alert, following commands, RUE/RLE profound weakness/diminished sensation  Data Reviewed: I have personally reviewed following labs and imaging studies  CBC: Recent Labs  Lab 07/29/20 0302  WBC 8.7  NEUTROABS 5.4  HGB 13.3  HCT 37.2*  MCV 87.3  PLT 152    Basic Metabolic Panel: Recent Labs  Lab 07/29/20 0302 07/30/20 0450  NA 141 137  K 3.3* 3.6  CL 105 100  CO2 26 26  GLUCOSE 160* 169*  BUN 14 16  CREATININE 0.97 0.96  CALCIUM 9.0 9.2  MG 1.8 1.9   GFR: Estimated Creatinine Clearance: 167.3 mL/min (by C-G formula based on SCr of 0.96 mg/dL).   Liver Function Tests: Recent Labs  Lab 07/29/20 0302 07/30/20 0450  AST 26 29  ALT 46* 51*  ALKPHOS 41 40  BILITOT 1.0 1.0  PROT 6.3* 6.3*  ALBUMIN 3.0* 2.9*   No results for input(s): LIPASE, AMYLASE in the last 168 hours. No results for input(s): AMMONIA in the last 168 hours.  Coagulation Profile: No results for input(s): INR, PROTIME in the last 168 hours. Cardiac Enzymes: No results for input(s): CKTOTAL, CKMB, CKMBINDEX, TROPONINI in the last 168 hours.  BNP (last 3 results) No results for input(s): PROBNP in the last 8760 hours.  HbA1C: No results for input(s): HGBA1C in the last 72 hours.  CBG: Recent Labs  Lab 08/03/20 1235 08/03/20 1602 08/03/20 2123 08/04/20 0620 08/04/20 0727  GLUCAP 155* 118* 163* 127* 134*   Lipid Profile: No results for input(s): CHOL, HDL, LDLCALC, TRIG, CHOLHDL, LDLDIRECT in the last 72 hours.  Thyroid Function Tests: No results for input(s): TSH, T4TOTAL, FREET4, T3FREE, THYROIDAB in the last 72 hours.  Anemia Panel: No results for input(s): VITAMINB12, FOLATE, FERRITIN, TIBC, IRON, RETICCTPCT in the last 72 hours.  Sepsis Labs: No results for input(s): PROCALCITON, LATICACIDVEN in the last 168 hours. No results found for this or any previous visit (from the past 240 hour(s)).  Radiology Studies: No results found.  Scheduled Meds: . vitamin C  500 mg Oral Daily  . aspirin  300 mg Rectal Daily   Or  . aspirin  325 mg Oral Daily  . Chlorhexidine Gluconate Cloth  6 each Topical Q0600  . cholecalciferol  1,000 Units Oral QPM  . cloNIDine  0.2 mg Oral BID  . feeding supplement  237 mL Oral BID BM  . heparin injection  (subcutaneous)  7,500 Units Subcutaneous Q8H  . hydrochlorothiazide  25 mg Oral Daily  . insulin aspart  0-15 Units Subcutaneous TID AC & HS  . irbesartan  300 mg Oral Daily  . mouth rinse  15 mL Mouth Rinse BID  . meloxicam  15 mg Oral Daily  . metFORMIN  1,000 mg Oral BID WC  . metoprolol tartrate  100 mg Oral BID  . multivitamin with minerals  1 tablet Oral Daily  . pantoprazole sodium  40 mg Oral Daily  . potassium chloride  40 mEq Oral Daily  . rosuvastatin  10 mg Oral Daily  . sertraline  25 mg Oral Daily  . sodium chloride flush  3 mL Intravenous Once  . zinc sulfate  220 mg Oral QPM    LOS: 11 days   Time spent: 25 minutes  Azucena Fallen, DO [facility]  If 7PM-7AM, please contact night-coverage [email or web address] 08/04/2020, 7:38 AM

## 2020-08-05 DIAGNOSIS — I6300 Cerebral infarction due to thrombosis of unspecified precerebral artery: Secondary | ICD-10-CM | POA: Diagnosis not present

## 2020-08-05 DIAGNOSIS — I6602 Occlusion and stenosis of left middle cerebral artery: Secondary | ICD-10-CM | POA: Diagnosis not present

## 2020-08-05 DIAGNOSIS — I639 Cerebral infarction, unspecified: Secondary | ICD-10-CM | POA: Diagnosis not present

## 2020-08-05 LAB — GLUCOSE, CAPILLARY
Glucose-Capillary: 161 mg/dL — ABNORMAL HIGH (ref 70–99)
Glucose-Capillary: 139 mg/dL — ABNORMAL HIGH (ref 70–99)
Glucose-Capillary: 180 mg/dL — ABNORMAL HIGH (ref 70–99)
Glucose-Capillary: 158 mg/dL — ABNORMAL HIGH (ref 70–99)

## 2020-08-05 NOTE — Progress Notes (Signed)
Inpatient Rehab Admissions Coordinator:   I met with pt. At bedside for ongoing discussion of CIR admission. He remains interested. I am working on Ship [facility], but do not have auth or a bed for pt. Today. I will continue to follow for potential admission later this week, pending bed availability and insurance authorization.   Clemens Catholic, [place], [place] Admissions Coordinator  [phone] ([place]) [phone] (office)

## 2020-08-05 NOTE — Progress Notes (Signed)
SLP Cancellation Note  Patient Details Name: Justin Mason MRN: [id number] DOB: 15-Jul-1980   Cancelled treatment:       Reason Eval/Treat Not Completed: attempted to see x2, pt with other staff both attempts; patient at procedure or test/unavailable  [name], MA CCC/SLP Acute Rehabilitation Services Office number [phone] Pager [phone]  Carolan Shiver 08/05/2020, 3:31 PM

## 2020-08-05 NOTE — Consult Note (Signed)
Physical Medicine and Rehabilitation Consult   Reason for Consult: Functional deficits due to stroke/Covid Referring Physician: Dr. Natale Milch   HPI: Justin Mason is a 40 y.o. male with history of HTN, obesity, T2DM, HTN, recent diagnosis of Covid who was admitted on 07/24/20 with right facial droop, RUE weakness and difficulty talking. CTA head/neck with perfusion showed short segment occlusion of proximal R-M1/MCA, occlusion of L-ICA terminus and proximal L-M1 segment, moderate stenosis right P2/PCA and diffuse intracranial vasculopathy question severe atherosclerosis v/s vasculitis. CT angioplasty of MCA attempted without success due to significant resistance and suspected to be chronic. He tolerated extubation without difficulty and respiratory status stable--->no indication for Covid Tx. 2D echo showed EF 60-65%. Dr. Pearlean Brownie recommends DAPT x 3 months followed by ASA alone. BSS showed mild oral dysphagia and patient on DIII, thins. Patient with resultant Right hemiplegia with right inattention and severe R-lean, global aphasia and depressed mood affecting ADLs and mobility. Physical Medicine & Rehabilitation was consulted to assess candidacy for CIR given functional decline. Off COVID precautions.    Review of Systems  Unable to perform ROS: Other      Past Medical History:  Diagnosis Date  . Arthritis   . Cardiac murmur 05/24/2020  . Cellulitis 01/03/2015  . Cellulitis of right leg   . Cellulitis of right lower extremity 01/03/2015  . Essential hypertension   . GERD (gastroesophageal reflux disease)   . High cholesterol   . Hyperlipemia   . Hypertension   . Morbid obesity (HCC) 05/24/2020  . Rotator cuff tear, right 09/08/2011  . Sepsis (HCC) 01/03/2015  . Type 2 diabetes mellitus with hyperglycemia [facility])     Past Surgical History:  Procedure Laterality Date  . INCISION AND DRAINAGE FOOT  1991-1992   STEPPED ON NAIL / INFECTED WOUND  . IR ANGIO INTRA EXTRACRAN SEL COM  CAROTID INNOMINATE BILAT MOD SED  07/24/2020  . IR ANGIO VERTEBRAL SEL SUBCLAVIAN INNOMINATE UNI L MOD SED  07/24/2020  . IR ANGIO VERTEBRAL SEL VERTEBRAL UNI R MOD SED  07/24/2020  . RADIOLOGY WITH ANESTHESIA N/A 07/24/2020   Procedure: IR WITH ANESTHESIA - CODE STROKE;  Surgeon: Radiologist, Medication, MD;  Location: [facility] OR;  Service: Radiology;  Laterality: N/A;  . SHOULDER SURGERY     September 08, 2011    Family History  Problem Relation Age of Onset  . Anesthesia problems Sister   . Prostate cancer Other   . Colon cancer Other     Social History:  Lives with mother. Mother is oxygen dependent. 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 Per reports that he has never smoked. His smokeless tobacco use includes snuff-- a can/day. Per  reports current alcohol --beer use of about 7.0 standard drinks of alcohol per week. He reports that he does not use drugs.    Allergies: No Known Allergies   Medications Prior to Admission  Medication Sig Dispense Refill  . allopurinol (ZYLOPRIM) 100 MG tablet Take 100 mg by mouth as needed (gout).    . Ascorbic Acid (VITAMIN C PO) Take 1 tablet by mouth every evening.    . Cholecalciferol (VITAMIN D-3) 25 MCG (1000 UT) CAPS Take 1,000 Units by mouth every evening.    . dapagliflozin propanediol (FARXIGA) 10 MG TABS tablet Take 10 mg by mouth daily.    Marland Kitchen esomeprazole (NEXIUM) 20 MG capsule Take 20 mg by mouth daily.    . meloxicam (MOBIC) 15 MG tablet Take 15 mg  by mouth daily.    . metFORMIN (GLUCOPHAGE) 1000 MG tablet Take 1,000 mg by mouth 2 (two) times daily.    . metoprolol succinate (TOPROL-XL) 100 MG 24 hr tablet Take 100 mg by mouth 2 (two) times daily.    Marland Kitchen olmesartan-hydrochlorothiazide (BENICAR HCT) 40-25 MG tablet Take 1 tablet by mouth daily.    . Zinc 50 MG TABS Take 200 mg by mouth every evening.    Marland Kitchen amLODipine (NORVASC) 10 MG tablet Take 1 tablet (10 mg total) by mouth daily. (Patient not taking: No sig reported) 30 tablet 12  .  cloNIDine (CATAPRES) 0.1 MG tablet Take 1 tablet (0.1 mg total) by mouth 2 (two) times daily. (Patient not taking: No sig reported) 60 tablet 6    Home: Home Living Family/patient expects to be discharged to:: Private residence Living Arrangements: Parent Available Help at Discharge: Family,Available 24 hours/day Type of Home: House Home Access: Stairs to enter [employer] of Steps: 4 Home Layout: One level Bathroom Shower/Tub: Associate Professor: Yes Home Equipment: None  Lives With: Family  Functional History: Prior Function Level of Independence: Independent Comments: Pt is a Landscape architect (heavy lifting, pipe carrying, HVAC unit repair). Information obtained from calling pt's mother, [name] Functional Status:  Mobility: Bed Mobility Overal bed mobility: Needs Assistance Bed Mobility: Rolling,Sidelying to Sit Rolling: Max assist Sidelying to sit: +2 for physical assistance,+2 for safety/equipment,HOB elevated,Mod assist Supine to sit: Mod assist,HOB elevated Sit to supine: Max assist,+2 for safety/equipment General bed mobility comments: Pt sitting up in stedy with OT upon arrival. Transfers Overall transfer level: Needs assistance Equipment used: Ambulation equipment used Transfer via Lift Equipment: Stedy (sara) Transfers: Sit to/from [employer] to Stand: Mod assist Stand pivot transfers: Total assist  Lateral/Scoot Transfers: Mod assist,+2 physical assistance General transfer comment: Pt sitting up in stedy with OT upon arrival, with OT handing pt off to PT. Pt able to come to stand from stedy with min-modA and tactile and verbal cues at R glut and quad to extend hip and knee. Verbal and visual cues to maintain midline, cuing pt to watch his head/shoulders between the 2 grey bars of the stedy, mod success. TA on stedy to pivot to chair from EOB. Ambulation/Gait General Gait Details:  Pre-gait training in stedy, cuing pt to shift weight laterally between R and L leg, needing min-modAx1 to maintain balance due to intermittent R hip and knee flexion with stance phase and R lean. Provided extensive verbal, visual, and tactile cues to extend R hip and knee with R stance, mod success. x3 bouts of ~1-2 min each.    ADL: ADL Overall ADL's : Needs assistance/impaired Eating/Feeding: Set up,Sitting Eating/Feeding Details (indicate cue type and reason): able to bring cup to mouth with LUE wit set- up assist Grooming: Wash/dry face,Moderate assistance,Sitting Grooming Details (indicate cue type and reason): wiping face with L UE and neglected R side of face Upper Body Bathing: Total assistance Lower Body Bathing: Total assistance,Sit to/from stand,Moderate assistance Lower Body Bathing Details (indicate cue type and reason): total A for simulated LB bathing tasks via posterior pericare, MOD A for standing balance in stedy d/t R lateral lean Upper Body Dressing : Total assistance Lower Body Dressing: Total assistance Toilet Transfer: Moderate assistance,+2 for physical assistance,Total assistance Toilet Transfer Details (indicate cue type and reason): simualated via stedy transfer to recliner, MOD A +2 to power into standing with stedy,total A for pivot with stedy, pt requries  assist to manage RUE on stedy and physical assist at RLE to come into full extension Toileting- Clothing Manipulation and Hygiene: Total assistance,Moderate assistance,Sit to/from stand Toileting - Clothing Manipulation Details (indicate cue type and reason): MOD A for standing balance in stedy, total A for posterior pericare in standing Functional mobility during ADLs: Moderate assistance,+2 for physical assistance,+2 for safety/equipment (sit<>stand in stedy) General ADL Comments: pt making good progress, completing x4 sit<>stands with MOD A +1, 1 from EOB and x3 from seat of  stedy  Cognition: Cognition Overall Cognitive Status: Impaired/Different from baseline Arousal/Alertness: Awake/alert Orientation Level: Oriented X4 Attention: Sustained Sustained Attention: Impaired Memory:  (will asssess as able) Awareness: Impaired Awareness Impairment: Emergent impairment Problem Solving: Impaired Behaviors: Lability Safety/Judgment: Impaired Cognition Arousal/Alertness: Awake/alert Behavior During Therapy: Flat affect,Impulsive Overall Cognitive Status: Impaired/Different from baseline Area of Impairment: Following commands,Safety/judgement,Awareness,Problem solving Orientation Level: Disoriented to,Place Following Commands: Follows one step commands with increased time,Follows one step commands consistently Safety/Judgement: Decreased awareness of safety,Decreased awareness of deficits Awareness: Emergent Problem Solving: Difficulty sequencing,Decreased initiation,Requires verbal cues,Requires tactile cues General Comments: Pt follows simple mobility commands, can be impulsive at times. Required simple commands to sequence tasks. Flat affect throughout. Difficult to assess due to: Impaired communication (globally aphasic)   Blood pressure 130/81, pulse 64, temperature 98.5 F (36.9 C), temperature source Oral, resp. rate 18, weight (!) 166.3 kg, SpO2 97 %. General: Alert, No apparent distress HEENT: Head is normocephalic, atraumatic, PERRLA, EOMI, sclera anicteric, oral mucosa pink and moist, dentition intact, ext ear canals clear,  Neck: Supple without JVD or lymphadenopathy Heart: Reg rate and rhythm. No murmurs rubs or gallops Chest: CTA bilaterally without wheezes, rales, or rhonchi; no distress Abdomen: Soft, non-tender, non-distended, bowel sounds positive. Extremities: No clubbing, cyanosis, or edema. Pulses are 2+ Psych: Pt's affect is appropriate. Pt is cooperative. Emotional at times. Skin: Clean and intact without signs of breakdown Neuro:  Makes good eye contact. 0/5 strength right side. No verbal output. Performing sit to stand with stedy ModAx2. Impaired problem solving, slow to follow command, decreased safety awareness.     Results for orders placed or performed during the hospital encounter of 07/24/20 (from the past 24 hour(s))  Glucose, capillary     Status: Abnormal   Collection Time: 08/04/20 12:13 PM  Result Value Ref Range   Glucose-Capillary 117 (H) 70 - 99 mg/dL  Glucose, capillary     Status: Abnormal   Collection Time: 08/04/20  4:22 PM  Result Value Ref Range   Glucose-Capillary 171 (H) 70 - 99 mg/dL  Glucose, capillary     Status: Abnormal   Collection Time: 08/04/20  9:32 PM  Result Value Ref Range   Glucose-Capillary 160 (H) 70 - 99 mg/dL  Glucose, capillary     Status: Abnormal   Collection Time: 08/05/20  6:31 AM  Result Value Ref Range   Glucose-Capillary 161 (H) 70 - 99 mg/dL   No results found.   Assessment/Plan: Diagnosis: Multiple occlusions: R MCA, L ICA, PCA stenosis 1. Does the need for close, 24 hr/day medical supervision in concert with the patient's rehab needs make it unreasonable for this patient to be served in a less intensive setting? Yes 2. Co-Morbidities requiring supervision/potential complications:  1. Morbid obesity: 166.3kg 2. DM2 3. HLD 4. HTN: Will require regular monitoring of vitals. 5. COVID infection 1 week prior to admission. 6. Right sided severe weakness: will require intensive OT and OT 7. Expressive >receptive aphasia: will require intensive SLP 3. Due to  bladder management, bowel management, safety, skin/wound care, disease management, medication administration, pain management and patient education, does the patient require 24 hr/day rehab nursing? Yes 4. Does the patient require coordinated care of a physician, rehab nurse, therapy disciplines of PT, OT, SLP to address physical and functional deficits in the context of the above medical diagnosis(es)?  Yes Addressing deficits in the following areas: balance, endurance, locomotion, strength, transferring, bowel/bladder control, bathing, dressing, feeding, grooming, toileting, cognition, speech, language and psychosocial support 5. Can the patient actively participate in an intensive therapy program of at least 3 hrs of therapy per day at least 5 days per week? Yes 6. The potential for patient to make measurable gains while on inpatient rehab is good 7. Anticipated functional outcomes upon discharge from inpatient rehab are min assist  with PT, min assist with OT, min assist with SLP. 8. Estimated rehab length of stay to reach the above functional goals is: 22-24 days 9. Anticipated discharge destination: Home 10. Overall Rehab/Functional Prognosis: excellent  RECOMMENDATIONS: This patient's condition is appropriate for continued rehabilitative care in the following setting: CIR Patient has agreed to participate in recommended program. Yes Note that insurance prior authorization may be required for reimbursement for recommended care.  Comment: Thank you for this consult. Admission coordinator to follow.   I have personally performed a face to face diagnostic evaluation, including, but not limited to relevant history and physical exam findings, of this patient and developed relevant assessment and plan.  Additionally, I have reviewed and concur with the physician assistant's documentation above.  Sula Soda, MD  Jacquelynn Cree, PA-C 08/05/2020

## 2020-08-05 NOTE — Progress Notes (Signed)
PROGRESS NOTE    Justin Mason  WUX:[id number] DOB: 12/09/1980 DOA: 07/24/2020 PCP: Eber Jones, NP   Brief Narrative:   40 yo M PMH morbid obesity, DM2, HLD, HTN, known COVID-19 infection (dx less than 1 week PTA) who presented to ED 1/19 with aphasia and RUE weakness beginning at about 1030 AM 07/24/2020 and progressing until 1230 same day with R facial droop. CT H with concern for L sided infarcts CTA with short segment occlusion of R M1/MCA, L ICA occlusion, proximal L M1, stenosis of PCAs. Outside of window for tpa. Went to [facility]for revascularization but angioplasty of MCA unsuccessful due to resistance. Patient continues to progress daily with speech and PT - on covid precautions through 08/02/20 which limits disposition. Hopefully now that off covid (as of 08/03/20) he will be able to dispo to CIR vs SNF given ongoing needs. CIR currently following for evaluation. Medically stable for discharge if they agree he meets criteria.  Assessment & Plan:  L ICA occlusion, severe R MCA stenosis - Presenting with right side weakness and aphasia - Unsuccessful revascularization due to chronicity - Cause of decompensation of chronic disease unclear - possible hypoglycemic episode.  - Neuro eval'd: DAPT for 3 months then transition to single agent - Continue statin - SLP following - diet improving - back to regular/thins currently - Liquid Administration via Cup;Straw - Medication Administration Crushed with puree - Compensations:Slow rate; Minimize environmental distractions; Small sips/bites; Lingual sweep for clearance of pocketing - Postural Changes:Seated upright at 90 degrees - 1/25: Continuing to work with rehab services; still working on placement (limited due to previous covid/quarantine precautions).  Incidental covid positive - Acute respiratory failure post procedure - Hypoxia due to procedure; resolved - No current indication for COVID treatment   - Off quarantine  08/03/20 - 1/25: Stable on RA  Hypertension.  - Completed permissive HTN window - HCTZ, irbesartan, clonidine, metoprolol - BP stable - continue to follow  Hypokalemia - Resolved - no longer following given improved PO intake  DVT prophylaxis: Heparin Code Status: FULL Family Communication: Mother updated at length about prognosis/disposition Status is: Inpatient  Remains inpatient appropriate because:Inpatient level of care appropriate due to severity of illness  Dispo: The patient is from: Home             Anticipated d/c is to: SNF vs CIR             Anticipated d/c date is: 24-48h             Patient currently is medically stable to d/c.  Difficult to place patient No  Consultants:   Neurology  Subjective: No acute events overnight - still poorly verbal but smiling this am and much more pleasant/hopeful than last week.  Objective: Vitals:   08/04/20 1622 08/04/20 2035 08/04/20 2333 08/05/20 0426  BP: 127/71 111/76 (!) 146/86 130/81  Pulse: 66 70 73 64  Resp: 18 17 16 18   Temp:  98.3 F (36.8 C) 97.8 F (36.6 C) 98.5 F (36.9 C)  TempSrc:  Oral Oral Oral  SpO2: 97% 98% 97% 97%  Weight:        Intake/Output Summary (Last 24 hours) at 08/05/2020 0748 Last data filed at 08/04/2020 2000 Gross per 24 hour  Intake --  Output 600 ml  Net -600 ml   Filed Weights   07/24/20 1615 07/25/20 0604 07/26/20 1540  Weight: (!) 172.8 kg (!) 172.6 kg (!) 166.3 kg   Examination:  General: 40 y.o.  male resting in bed in NAD Eyes: PERRL, normal sclera ENMT: Nares patent w/o discharge, orophaynx clear, dentition normal, ears w/o discharge/lesions/ulcers Neck: Supple, trachea midline Cardiovascular: RRR, +S1, S2, no m/g/r, equal pulses throughout Respiratory: CTABL, no w/r/r, normal WOB GI: BS+, NDNT, no masses noted, no organomegaly noted Neuro: Alert, following commands, RUE/RLE profound weakness/diminished sensation  Data Reviewed: I have personally reviewed following  labs and imaging studies  CBC: No results for input(s): WBC, NEUTROABS, HGB, HCT, MCV, PLT in the last 168 hours. Basic Metabolic Panel: Recent Labs  Lab 07/30/20 0450  NA 137  K 3.6  CL 100  CO2 26  GLUCOSE 169*  BUN 16  CREATININE 0.96  CALCIUM 9.2  MG 1.9   GFR: Estimated Creatinine Clearance: 167.3 mL/min (by C-G formula based on SCr of 0.96 mg/dL).   Liver Function Tests: Recent Labs  Lab 07/30/20 0450  AST 29  ALT 51*  ALKPHOS 40  BILITOT 1.0  PROT 6.3*  ALBUMIN 2.9*   No results for input(s): LIPASE, AMYLASE in the last 168 hours. No results for input(s): AMMONIA in the last 168 hours.  Coagulation Profile: No results for input(s): INR, PROTIME in the last 168 hours. Cardiac Enzymes: No results for input(s): CKTOTAL, CKMB, CKMBINDEX, TROPONINI in the last 168 hours.  BNP (last 3 results) No results for input(s): PROBNP in the last 8760 hours.  HbA1C: No results for input(s): HGBA1C in the last 72 hours.  CBG: Recent Labs  Lab 08/04/20 0727 08/04/20 1213 08/04/20 1622 08/04/20 2132 08/05/20 0631  GLUCAP 134* 117* 171* 160* 161*   Lipid Profile: No results for input(s): CHOL, HDL, LDLCALC, TRIG, CHOLHDL, LDLDIRECT in the last 72 hours.  Thyroid Function Tests: No results for input(s): TSH, T4TOTAL, FREET4, T3FREE, THYROIDAB in the last 72 hours.  Anemia Panel: No results for input(s): VITAMINB12, FOLATE, FERRITIN, TIBC, IRON, RETICCTPCT in the last 72 hours.  Sepsis Labs: No results for input(s): PROCALCITON, LATICACIDVEN in the last 168 hours. No results found for this or any previous visit (from the past 240 hour(s)).  Radiology Studies: No results found.  Scheduled Meds: . vitamin C  500 mg Oral Daily  . aspirin  300 mg Rectal Daily   Or  . aspirin  325 mg Oral Daily  . cholecalciferol  1,000 Units Oral QPM  . cloNIDine  0.2 mg Oral BID  . feeding supplement  237 mL Oral BID BM  . heparin injection (subcutaneous)  7,500 Units  Subcutaneous Q8H  . hydrochlorothiazide  25 mg Oral Daily  . insulin aspart  0-15 Units Subcutaneous TID AC & HS  . irbesartan  300 mg Oral Daily  . mouth rinse  15 mL Mouth Rinse BID  . meloxicam  15 mg Oral Daily  . metFORMIN  1,000 mg Oral BID WC  . metoprolol tartrate  100 mg Oral BID  . multivitamin with minerals  1 tablet Oral Daily  . pantoprazole sodium  40 mg Oral Daily  . potassium chloride  40 mEq Oral Daily  . rosuvastatin  10 mg Oral Daily  . sertraline  25 mg Oral Daily  . sodium chloride flush  3 mL Intravenous Once  . zinc sulfate  220 mg Oral QPM    LOS: 12 days   Time spent: 25 minutes  Azucena Fallen, DO [facility]  If 7PM-7AM, please contact night-coverage [email or web address] 08/05/2020, 7:48 AM

## 2020-08-05 NOTE — Progress Notes (Signed)
Occupational Therapy Treatment Patient Details Name: Justin Mason MRN: [id number] DOB: [birth year] Today's Date: 08/05/2020    History of present illness 40 yo male with PMH of morbid obesity, DM2, HLD, HTN, known COVID-19 infection (dx less than 1 week PTA), history of shoulder surgery with residual R shoulder who presented to ED 1/19 with aphasia and RUE weakness beginning at about 1030 AM 07/24/2020 and progressing until 1230 same day with R facial droop. 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  Outside of window for tpa. Went to IR for revascularization but angioplasty of MCA unsuccessful due to resistance.   OT comments  Pt seen in conjunction with PT to maximize pts activity tolerance and progress pts functional mobility. Pt continues to present with R sided weakness/ decreased sensation, impaired balance and decreased strength/ endurance. Pt able to complete multiple sit<>stands from EOB and seat of stedy with MOD A +2. Utilized mirror this session to facilitate visual feedback in an effort for pt to self correct standing/ sitting balance. Pt continues to make no efforts to verbalize but nodding/ smiling appropriately throughout session. Pt able to complete seated grooming tasks at sink this session with hand over hand assist. Pt would continue to benefit from skilled occupational therapy while admitted and after d/c to address the below listed limitations in order to improve overall functional mobility and facilitate independence with BADL participation. DC plan remains appropriate, will follow acutely per POC.    Follow Up Recommendations  CIR    Equipment Recommendations  Wheelchair (measurements OT);Wheelchair cushion (measurements OT);3 in 1 bedside commode    Recommendations for Other Services      Precautions / Restrictions Precautions Precautions: Fall Precaution Comments: R  inattention, R severe hemiparesis (near plegia) Restrictions Weight Bearing Restrictions: No       Mobility Bed Mobility Overal bed mobility: Needs Assistance Bed Mobility: Supine to Sit     Supine to sit: Mod assist;HOB elevated;+2 for physical assistance     General bed mobility comments: education initiated on using gait belt around RLE with pt able to advance RLE to EOB with set- up assist. MOD A +2 overall to fully transition from supine>sit however pt assisting with scooting hips to EOB by using LUE on bed rail  Transfers Overall transfer level: Needs assistance Equipment used: Ambulation equipment used Transfers: Sit to/from [employer] Sit to Stand: Mod assist;+2 physical assistance Stand pivot transfers: Total assist (sara stedy)       General transfer comment: pt sit<>stand from EOB with MOD A +2 and from seat of stedy multiple times during session. pt requries hand over hand assist to maintain grasp on handle of stedy with RUE. utilized mirror this session to provided visual feedback to initiate better carryover of fully bringing hips into extension and elevating trunk. MOD A at times to maintain trunk at midline as pt with tendency to lean laterally to R. Performed sit>stand from seat of stedy 3x for strengthening. Worked on [employer] through R hand in standing    Balance Overall balance assessment: Needs assistance Sitting-balance support: Single extremity supported;Feet supported Sitting balance-Leahy Scale: Fair Sitting balance - Comments: pt able to sit EOB with one UE supported with min guard for safety Postural control: Right lateral lean Standing balance support: Bilateral upper extremity supported;During functional activity Standing balance-Leahy Scale: Poor Standing balance comment: Min-modAx1 for standing balance in stedy with UE  support.                           ADL either performed or assessed with clinical judgement   ADL  Overall ADL's : Needs assistance/impaired     Grooming: Wash/dry face;Sitting;Oral care;Cueing for compensatory techniques;Moderate assistance;Total assistance Grooming Details (indicate cue type and reason): pt able to sit on seat of stedy for UB grooming tasks, education provided on using RUE as stabilizer during grooming tasks. pt still requried hand over hand assist to maintain grasp on with RUE on tooth brush. cues throughout to maintain upright sitting balance, upto MOD A for sitting balance.     Lower Body Bathing: Total assistance;Sit to/from stand Lower Body Bathing Details (indicate cue type and reason): total A for posterior pericare d/t incontinent bladder         Toilet Transfer: Moderate assistance;+2 for physical assistance;Total assistance Toilet Transfer Details (indicate cue type and reason): MOD A +2 to stand to stedy, total A with stedy to pivot to recliner Toileting- Clothing Manipulation and Hygiene: Total assistance;Sit to/from stand Toileting - Clothing Manipulation Details (indicate cue type and reason): total A for pericare in standing     Functional mobility during ADLs: Moderate assistance;+2 for physical assistance (sit<>stand to stedy) General ADL Comments: pt continues to make excellent progress able to complete seated grooming tasks using RUE as stabilizer needing total A hand over hand assist to maintain functional grasp on toothbrush. pt completed multiple sit<>stands from stedy with MOD A +2. pt continues to present with impaired balance, however utilized mirror for visual feedback to assist with maintaining balance. pt with good carryover     Vision       Perception     Praxis      Cognition Arousal/Alertness: Awake/alert Behavior During Therapy: Flat affect Overall Cognitive Status: Difficult to assess Area of Impairment: Safety/judgement;Awareness;Problem solving                       Following Commands: Follows one step commands  with increased time;Follows one step commands consistently Safety/Judgement: Decreased awareness of safety;Decreased awareness of deficits Awareness: Emergent Problem Solving: Difficulty sequencing;Requires verbal cues;Requires tactile cues General Comments: pt following all commands and nodding appropriately during session, however pt making no effort to verbalize during session. pt emotional labile during session when asking about PLOF        Exercises     Shoulder Instructions       General Comments pt very emotional when girlfriend relaying info about family and PLOF    Pertinent Vitals/ Pain       Pain Assessment: Faces Faces Pain Scale: No hurt  Home Living                                          Prior Functioning/Environment              Frequency  Min 2X/week        Progress Toward Goals  OT Goals(current goals can now be found in the care plan section)  Progress towards OT goals: Progressing toward goals  Acute Rehab OT Goals Patient Stated Goal: get pt as independent as possible Time For Goal Achievement: 08/09/20 Potential to Achieve Goals: Good  Plan Discharge plan remains appropriate;Frequency remains appropriate    Co-evaluation      Reason  for Co-Treatment: Complexity of the patient's impairments (multi-system involvement);For patient/therapist safety PT goals addressed during session: Mobility/safety with mobility;Balance;Proper use of DME;Strengthening/ROM        AM-PAC OT "6 Clicks" Daily Activity     Outcome Measure   Help from another person eating meals?: A Little Help from another person taking care of personal grooming?: A Little Help from another person toileting, which includes using toliet, bedpan, or urinal?: A Lot Help from another person bathing (including washing, rinsing, drying)?: A Lot Help from another person to put on and taking off regular upper body clothing?: A Lot Help from another person to put  on and taking off regular lower body clothing?: Total 6 Click Score: 13    End of Session Equipment Utilized During Treatment: Other (comment) (stedy)  OT Visit Diagnosis: Unsteadiness on feet (R26.81);Muscle weakness (generalized) (M62.81)   Activity Tolerance Patient tolerated treatment well   Patient Left in chair;with call bell/phone within reach;with family/visitor present   Nurse Communication Mobility status;Other (comment) (male purewick not working)        Time: 1020-1103 OT Time Calculation (min): 43 min  Charges: OT General Charges $OT Visit: 1 Visit OT Treatments $Self Care/Home Management : 8-22 mins  Lenor Derrick., COTA/L Acute Rehabilitation Services [phone]    Barron Schmid 08/05/2020, 1:28 PM

## 2020-08-05 NOTE — Progress Notes (Signed)
Physical Therapy Treatment Patient Details Name: Justin Mason MRN: [id number] DOB: Dec 16, 1980 Today's Date: 08/05/2020    History of Present Illness 40 yo male with PMH of morbid obesity, DM2, HLD, HTN, known COVID-19 infection (dx less than 1 week PTA), history of shoulder surgery with residual R shoulder who presented to ED 1/19 with aphasia and RUE weakness beginning at about 1030 AM 07/24/2020 and progressing until 1230 same day with R facial droop. 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  Outside of window for tpa. Went to IR for revascularization but angioplasty of MCA unsuccessful due to resistance.    PT Comments    Pt works very hard throughout therapy session with PT/OT, attempting to do as much as he can independently using L side. Pt did not attempt to verbalize during session and would really benefit from alternate communication source. His girlfriend reported that he was able to write his name yesterday. Pt came to EOB with mod A +2 for trunk support. Pt taught to use strap to use RLE with L hand. Pt performed sit to stand to stedy with mod A +2 and then multiple sit>stand within stedy. Used mirror for working on midline posture. Pt performed pregait activities in standing in stedy. Girlfriend present end of session. PT will continue to follow.    Follow Up Recommendations  CIR;Supervision/Assistance - 24 hour     Equipment Recommendations  Other (comment) (TBD)    Recommendations for Other Services       Precautions / Restrictions Precautions Precautions: Fall Precaution Comments: R inattention, R severe hemiparesis (near plegia) Restrictions Weight Bearing Restrictions: No    Mobility  Bed Mobility Overal bed mobility: Needs Assistance Bed Mobility: Supine to Sit     Supine to sit: Mod assist;HOB elevated;+2 for physical assistance     General bed mobility comments:  education initiated on using gait belt around RLE with pt able to advance RLE to EOB with set- up assist. MOD A +2 overall to fully transition from supine>sit however pt assisting with scooting hips to EOB by using LUE on bed rail  Transfers Overall transfer level: Needs assistance Equipment used: Ambulation equipment used Transfers: Sit to/from [employer] Sit to Stand: Mod assist;+2 physical assistance Stand pivot transfers: Total assist (sara stedy)       General transfer comment: pt sit<>stand from EOB with MOD A +2 and from seat of stedy multiple times during session. pt requries hand over hand assist to maintain grasp on handle of stedy with RUE. utilized mirror this session to provided visual feedback to initiate better carryover of fully bringing hips into extension and elevating trunk. MOD A at times to maintain trunk at midline as pt with tendency to lean laterally to R. Performed sit>stand from seat of stedy 3x for strengthening. Worked on [employer] through R hand in standing  Ambulation/Gait             General Gait Details: pregait training in stedy with wt shifting and attempting to activiate R quad.   Stairs             Wheelchair Mobility    Modified Rankin (Stroke Patients Only) Modified Rankin (Stroke Patients Only) Pre-Morbid Rankin Score: No symptoms Modified Rankin: Severe disability     Balance Overall balance assessment: Needs assistance Sitting-balance support: Single extremity supported;Feet supported Sitting balance-Leahy Scale: Fair Sitting balance - Comments:  pt able to sit EOB with one UE supported with min guard for safety Postural control: Right lateral lean Standing balance support: Bilateral upper extremity supported;During functional activity Standing balance-Leahy Scale: Poor Standing balance comment: Min-modAx1 for standing balance in stedy with UE support.                            Cognition  Arousal/Alertness: Awake/alert Behavior During Therapy: Flat affect Overall Cognitive Status: Difficult to assess Area of Impairment: Safety/judgement;Awareness;Problem solving                       Following Commands: Follows one step commands with increased time;Follows one step commands consistently Safety/Judgement: Decreased awareness of safety;Decreased awareness of deficits Awareness: Emergent Problem Solving: Difficulty sequencing;Requires verbal cues;Requires tactile cues General Comments: pt following all commands and nodding appropriately during session, however pt making no effort to verbalize during session. pt emotional labile during session when asking about PLOF      Exercises      General Comments General comments (skin integrity, edema, etc.): pt very emotional when girlfriend relaying info about family and PLOF      Pertinent Vitals/Pain Pain Assessment: Faces Faces Pain Scale: No hurt    Home Living                      Prior Function            PT Goals (current goals can now be found in the care plan section) Acute Rehab PT Goals Patient Stated Goal: get pt as independent as possible PT Goal Formulation: With patient Time For Goal Achievement: 08/09/20 Potential to Achieve Goals: Good Progress towards PT goals: Progressing toward goals    Frequency    Min 4X/week      PT Plan Current plan remains appropriate    Co-evaluation PT/OT/SLP Co-Evaluation/Treatment: Yes Reason for Co-Treatment: Complexity of the patient's impairments (multi-system involvement);For patient/therapist safety PT goals addressed during session: Mobility/safety with mobility;Balance;Proper use of DME;Strengthening/ROM        AM-PAC PT "6 Clicks" Mobility   Outcome Measure  Help needed turning from your back to your side while in a flat bed without using bedrails?: A Lot Help needed moving from lying on your back to sitting on the side of a flat  bed without using bedrails?: A Lot Help needed moving to and from a bed to a chair (including a wheelchair)?: Total Help needed standing up from a chair using your arms (e.g., wheelchair or bedside chair)?: A Lot Help needed to walk in hospital room?: Total Help needed climbing 3-5 steps with a railing? : Total 6 Click Score: 9    End of Session Equipment Utilized During Treatment: Gait belt Activity Tolerance: Patient tolerated treatment well Patient left: in chair;with call bell/phone within reach;with family/visitor present Nurse Communication: Mobility status;Need for lift equipment PT Visit Diagnosis: Hemiplegia and hemiparesis;Other abnormalities of gait and mobility (R26.89);Unsteadiness on feet (R26.81);Muscle weakness (generalized) (M62.81);Difficulty in walking, not elsewhere classified (R26.2);Other symptoms and signs involving the nervous system (R29.898) Hemiplegia - Right/Left: Right Hemiplegia - dominant/non-dominant: Dominant Hemiplegia - caused by: Cerebral infarction     Time: 1022-1106 PT Time Calculation (min) (ACUTE ONLY): 44 min  Charges:  $Gait Training: 8-22 mins $Therapeutic Activity: 8-22 mins                     Lyanne Co, PT  Acute Rehab  Services  Pager [phone] Office [phone]    Lawana Chambers Jackquelyn Sundberg 08/05/2020, 1:22 PM

## 2020-08-06 LAB — GLUCOSE, CAPILLARY
Glucose-Capillary: 157 mg/dL — ABNORMAL HIGH (ref 70–99)
Glucose-Capillary: 124 mg/dL — ABNORMAL HIGH (ref 70–99)
Glucose-Capillary: 212 mg/dL — ABNORMAL HIGH (ref 70–99)
Glucose-Capillary: 128 mg/dL — ABNORMAL HIGH (ref 70–99)

## 2020-08-06 NOTE — Plan of Care (Signed)
  Problem: Education: Goal: Knowledge of disease or condition will improve Outcome: Progressing Goal: Knowledge of secondary prevention will improve Outcome: Progressing Goal: Knowledge of patient specific risk factors addressed and post discharge goals established will improve Outcome: Progressing Goal: Individualized Educational Video(s) Outcome: Progressing   Problem: Coping: Goal: Will verbalize positive feelings about self Outcome: Progressing Goal: Will identify appropriate support needs Outcome: Progressing   Problem: Health Behavior/Discharge Planning: Goal: Ability to manage health-related needs will improve Outcome: Progressing   Problem: Self-Care: Goal: Ability to participate in self-care as condition permits will improve Outcome: Progressing Goal: Verbalization of feelings and concerns over difficulty with self-care will improve Outcome: Progressing Goal: Ability to communicate needs accurately will improve Outcome: Progressing   Problem: Nutrition: Goal: Risk of aspiration will decrease Outcome: Progressing Goal: Dietary intake will improve Outcome: Progressing   Problem: Ischemic Stroke/TIA Tissue Perfusion: Goal: Complications of ischemic stroke/TIA will be minimized Outcome: Progressing   Problem: Education: Goal: Knowledge of risk factors and measures for prevention of condition will improve Outcome: Progressing   Problem: Coping: Goal: Psychosocial and spiritual needs will be supported Outcome: Progressing   Problem: Respiratory: Goal: Will maintain a patent airway Outcome: Progressing Goal: Complications related to the disease process, condition or treatment will be avoided or minimized Outcome: Progressing

## 2020-08-06 NOTE — Progress Notes (Signed)
Inpatient Rehab Admissions Coordinator:   I received insurance authorization for CIR admission. Will follow for potential admit once a bed becomes available.   Megan Salon, MS, CCC-SLP Rehab Admissions Coordinator  [phone] (celll) [phone] (office)

## 2020-08-06 NOTE — Progress Notes (Signed)
Inpatient Rehab Admissions Coordinator:   I opened a case with Pt.'s insurance yesterday, but have not yet received a response. I will continue to follow for potential admit pending insurance auth and bed availability.   Megan Salon, MS, CCC-SLP Rehab Admissions Coordinator  [phone] (celll) [phone] (office)

## 2020-08-06 NOTE — Progress Notes (Signed)
Pt's mother [name]) and girlfriend [name]) called for updates. They expressed a better understanding of the possibilities of why the pt was sleeping. I expressed that the pt was easily arousible and AOx4. Also explained that his sleepiness could be related to his stroke w/ that of having a recent dx of Covid 19 as well. Family stated that this sleepiness was not acute. Pt's girlfriend also mentioned that she noticed yeast at the pt's penis glands. Upon my focused assessment I discovered that there was not yeast present, pain, or any abnormal sensation expressed by the pt d/t pt being mute but nodes appropriately.

## 2020-08-06 NOTE — Progress Notes (Signed)
Physical Therapy Treatment Patient Details Name: Justin Mason MRN: [id number] DOB: 02/07/1981 Today's Date: 08/06/2020    History of Present Illness 40 yo male with PMH of morbid obesity, DM2, HLD, HTN, known COVID-19 infection (dx less than 1 week PTA), history of shoulder surgery with residual R shoulder who presented to ED 1/19 with aphasia and RUE weakness beginning at about 1030 AM 07/24/2020 and progressing until 1230 same day with R facial droop. 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  Outside of window for tpa. Went to IR for revascularization but angioplasty of MCA unsuccessful due to resistance.    PT Comments    Pt continues to work very hard to regain some independence. Improving in coming to EOB using L side, mod A to RLE and trunk to come to erect sitting. Pt worked on sit<>stand in stedy 5+ times for strengthening and in front of mirror for maintaining midline. Pt maintained standing for 2 min bouts. Worked on wt shifting and lifting LLE in standing with R knee blocked. Continue to recommend CIR. PT will continue to follow.    Follow Up Recommendations  CIR;Supervision/Assistance - 24 hour     Equipment Recommendations  Other (comment) (TBD)    Recommendations for Other Services       Precautions / Restrictions Precautions Precautions: Fall Precaution Comments: R inattention, R side flaccid Restrictions Weight Bearing Restrictions: No    Mobility  Bed Mobility Overal bed mobility: Needs Assistance Bed Mobility: Supine to Sit   Sidelying to sit: +2 for physical assistance;Mod assist       General bed mobility comments: assist to RLE to come to EOB. Tactile cues to R shoulder for rolling and vc's for L hand to grab R. Mod A for elevation of trunk into sitting. Pt able to scoot to EOB once trunk erect  Transfers Overall transfer level: Needs assistance Equipment used:  Ambulation equipment used Transfers: Sit to/from [employer] Sit to Stand: Mod assist;+2 safety/equipment Stand pivot transfers: Total assist (sara stedy)       General transfer comment: pt performed sit<>stand 5+ times from stedy at bedside and in front of mirror working on maintaining midline on the way up.  Ambulation/Gait             General Gait Details: pregait training in stedy with wt shifting and lifting LLE with R knee blocked. Maintained standing for 2 min bouts   Stairs             Wheelchair Mobility    Modified Rankin (Stroke Patients Only) Modified Rankin (Stroke Patients Only) Pre-Morbid Rankin Score: No symptoms Modified Rankin: Severe disability     Balance Overall balance assessment: Needs assistance Sitting-balance support: Single extremity supported;Feet supported Sitting balance-Leahy Scale: Good Sitting balance - Comments: pt able to maintain sitting without UE support and accept moderate challenge Postural control: Right lateral lean Standing balance support: Bilateral upper extremity supported;During functional activity Standing balance-Leahy Scale: Poor Standing balance comment: heavy reliance on LUE support and external support                            Cognition Arousal/Alertness: Awake/alert Behavior During Therapy: WFL for tasks assessed/performed Overall Cognitive Status: Difficult to assess Area of Impairment: Safety/judgement;Awareness;Problem solving  Following Commands: Follows one step commands with increased time;Follows one step commands consistently Safety/Judgement: Decreased awareness of safety;Decreased awareness of deficits Awareness: Emergent Problem Solving: Difficulty sequencing;Requires verbal cues;Requires tactile cues General Comments: pt following all commands and nodding appropriately during session, however pt making no effort to verbalize during  session      Exercises General Exercises - Lower Extremity Ankle Circles/Pumps: Seated;PROM;Right;10 reps Long Arc Quad: Seated;PROM;10 reps;Right (no muscle activation noted with palpation on R)    General Comments General comments (skin integrity, edema, etc.): pt less emotional during session today. Girlfriend present end of session. Called mom after session and gave her mobility update      Pertinent Vitals/Pain Pain Assessment: Faces Faces Pain Scale: No hurt    Home Living                      Prior Function            PT Goals (current goals can now be found in the care plan section) Acute Rehab PT Goals Patient Stated Goal: get pt as independent as possible PT Goal Formulation: With patient Time For Goal Achievement: 08/09/20 Potential to Achieve Goals: Good Progress towards PT goals: Progressing toward goals    Frequency    Min 4X/week      PT Plan Current plan remains appropriate    Co-evaluation              AM-PAC PT "6 Clicks" Mobility   Outcome Measure  Help needed turning from your back to your side while in a flat bed without using bedrails?: A Lot Help needed moving from lying on your back to sitting on the side of a flat bed without using bedrails?: A Lot Help needed moving to and from a bed to a chair (including a wheelchair)?: Total Help needed standing up from a chair using your arms (e.g., wheelchair or bedside chair)?: A Lot Help needed to walk in hospital room?: Total Help needed climbing 3-5 steps with a railing? : Total 6 Click Score: 9    End of Session Equipment Utilized During Treatment: Gait belt Activity Tolerance: Patient tolerated treatment well Patient left: in chair;with call bell/phone within reach;with family/visitor present;with chair alarm set Nurse Communication: Mobility status;Need for lift equipment PT Visit Diagnosis: Hemiplegia and hemiparesis;Other abnormalities of gait and mobility  (R26.89);Unsteadiness on feet (R26.81);Muscle weakness (generalized) (M62.81);Difficulty in walking, not elsewhere classified (R26.2);Other symptoms and signs involving the nervous system (R29.898) Hemiplegia - Right/Left: Right Hemiplegia - dominant/non-dominant: Dominant Hemiplegia - caused by: Cerebral infarction     Time: 9563-8756 PT Time Calculation (min) (ACUTE ONLY): 32 min  Charges:  $Gait Training: 8-22 mins $Therapeutic Activity: 8-22 mins                     Lyanne Co, PT  Acute Rehab Services  Pager [phone] Office [phone]    Lawana Chambers Nayvie Lips 08/06/2020, 4:40 PM

## 2020-08-06 NOTE — Progress Notes (Signed)
PROGRESS NOTE    Justin Mason  GMW:[id number] DOB: 28-Oct-1980 DOA: 07/24/2020 PCP: Eber Jones, NP   Brief Narrative:   40 yo M PMH morbid obesity, DM2, HLD, HTN, known COVID-19 infection (dx less than 1 week PTA) who presented to ED 1/19 with aphasia and RUE weakness beginning at about 1030 AM 07/24/2020 and progressing until 1230 same day with R facial droop. CT H with concern for L sided infarcts CTA with short segment occlusion of R M1/MCA, L ICA occlusion, proximal L M1, stenosis of PCAs. Outside of window for tpa. Went to [facility]1/19[facility]22for revascularization but angioplasty of MCA unsuccessful due to resistance. Patient continues to progress daily with speech and PT - on covid precautions through 08/02/20 which limits disposition. Hopefully now that off covid (as of 08/03/20) he will be able to dispo to CIR vs SNF given ongoing needs. CIR currently following for evaluation. Medically stable for discharge if they agree he meets criteria.  Assessment & Plan:  L ICA occlusion, severe R MCA stenosis - Presenting with right side weakness and aphasia - Unsuccessful revascularization due to chronicity - Cause of decompensation of chronic disease unclear - possible hypoglycemic episode.  - Neuro eval'd: DAPT for 3 months then transition to single agent - Continue statin - SLP following - diet improving - back to regular/thins currently - Liquid Administration via Cup;Straw - Medication Administration Crushed with puree - Compensations:Slow rate; Minimize environmental distractions; Small sips/bites; Lingual sweep for clearance of pocketing - Postural Changes:Seated upright at 90 degrees - 1/25-2/1: Continuing to work with rehab services; still working on placement (limited due to previous covid/quarantine precautions).  Incidental covid positive - Acute respiratory failure post procedure - Hypoxia due to procedure; resolved - No current indication for COVID treatment   - Off quarantine  08/03/20 - 1/25: Stable on RA  Hypertension.  - Completed permissive HTN window - HCTZ, irbesartan, clonidine, metoprolol - BP stable - continue to follow  Hypokalemia - Resolved - no longer following given improved PO intake  DVT prophylaxis: Heparin Code Status: FULL Family Communication: Mother updated at length about prognosis/disposition Status is: Inpatient  Remains inpatient appropriate because:Inpatient level of care appropriate due to severity of illness  Dispo: The patient is from: Home             Anticipated d/c is to: SNF vs CIR             Anticipated d/c date is: 24-48h             Patient currently is medically stable to d/c.  Difficult to place patient No  Consultants:   Neurology  Subjective: No acute events overnight - still poorly verbal but smiling this am and much more pleasant/hopeful than last week.  Objective: Vitals:   08/05/20 1825 08/05/20 2005 08/06/20 0009 08/06/20 0401  BP: (!) 142/81 133/71 130/70 (!) 144/80  Pulse: 61 66 68 60  Resp: 15 15 16 18   Temp: (!) 97.5 F (36.4 C) 98 F (36.7 C) 98.6 F (37 C) 98.1 F (36.7 C)  TempSrc: Oral Oral Oral Oral  SpO2: 98% 96% 96% 97%  Weight:        Intake/Output Summary (Last 24 hours) at 08/06/2020 0733 Last data filed at 08/05/2020 0800 Gross per 24 hour  Intake -  Output 425 ml  Net -425 ml   Filed Weights   07/24/20 1615 07/25/20 0604 07/26/20 1540  Weight: (!) 172.8 kg (!) 172.6 kg (!) 166.3 kg  Examination:  General: 40 y.o. male resting in bed in NAD Eyes: PERRL, normal sclera ENMT: Nares patent w/o discharge, orophaynx clear, dentition normal, ears w/o discharge/lesions/ulcers Neck: Supple, trachea midline Cardiovascular: RRR, +S1, S2, no m/g/r, equal pulses throughout Respiratory: CTABL, no w/r/r, normal WOB GI: BS+, NDNT, no masses noted, no organomegaly noted Neuro: Alert, following commands, RUE/RLE profound weakness/diminished sensation  Data Reviewed: I have  personally reviewed following labs and imaging studies  CBC: No results for input(s): WBC, NEUTROABS, HGB, HCT, MCV, PLT in the last 168 hours. Basic Metabolic Panel: No results for input(s): NA, K, CL, CO2, GLUCOSE, BUN, CREATININE, CALCIUM, MG, PHOS in the last 168 hours. GFR: Estimated Creatinine Clearance: 167.3 mL/min (by C-G formula based on SCr of 0.96 mg/dL).   Liver Function Tests: No results for input(s): AST, ALT, ALKPHOS, BILITOT, PROT, ALBUMIN in the last 168 hours. No results for input(s): LIPASE, AMYLASE in the last 168 hours. No results for input(s): AMMONIA in the last 168 hours.  Coagulation Profile: No results for input(s): INR, PROTIME in the last 168 hours. Cardiac Enzymes: No results for input(s): CKTOTAL, CKMB, CKMBINDEX, TROPONINI in the last 168 hours.  BNP (last 3 results) No results for input(s): PROBNP in the last 8760 hours.  HbA1C: No results for input(s): HGBA1C in the last 72 hours.  CBG: Recent Labs  Lab 08/05/20 0631 08/05/20 1150 08/05/20 1636 08/05/20 2123 08/06/20 0621  GLUCAP 161* 139* 180* 158* 157*   Lipid Profile: No results for input(s): CHOL, HDL, LDLCALC, TRIG, CHOLHDL, LDLDIRECT in the last 72 hours.  Thyroid Function Tests: No results for input(s): TSH, T4TOTAL, FREET4, T3FREE, THYROIDAB in the last 72 hours.  Anemia Panel: No results for input(s): VITAMINB12, FOLATE, FERRITIN, TIBC, IRON, RETICCTPCT in the last 72 hours.  Sepsis Labs: No results for input(s): PROCALCITON, LATICACIDVEN in the last 168 hours. No results found for this or any previous visit (from the past 240 hour(s)).  Radiology Studies: No results found.  Scheduled Meds: . vitamin C  500 mg Oral Daily  . aspirin  300 mg Rectal Daily   Or  . aspirin  325 mg Oral Daily  . cholecalciferol  1,000 Units Oral QPM  . cloNIDine  0.2 mg Oral BID  . feeding supplement  237 mL Oral BID BM  . heparin injection (subcutaneous)  7,500 Units Subcutaneous Q8H  .  hydrochlorothiazide  25 mg Oral Daily  . insulin aspart  0-15 Units Subcutaneous TID AC & HS  . irbesartan  300 mg Oral Daily  . mouth rinse  15 mL Mouth Rinse BID  . meloxicam  15 mg Oral Daily  . metFORMIN  1,000 mg Oral BID WC  . metoprolol tartrate  100 mg Oral BID  . multivitamin with minerals  1 tablet Oral Daily  . pantoprazole sodium  40 mg Oral Daily  . potassium chloride  40 mEq Oral Daily  . rosuvastatin  10 mg Oral Daily  . sertraline  25 mg Oral Daily  . zinc sulfate  220 mg Oral QPM    LOS: 13 days   Time spent: 25 minutes  Azucena Fallen, DO [facility]  If 7PM-7AM, please contact night-coverage [email or web address] 08/06/2020, 7:33 AM

## 2020-08-07 LAB — GLUCOSE, CAPILLARY
Glucose-Capillary: 157 mg/dL — ABNORMAL HIGH (ref 70–99)
Glucose-Capillary: 160 mg/dL — ABNORMAL HIGH (ref 70–99)
Glucose-Capillary: 169 mg/dL — ABNORMAL HIGH (ref 70–99)
Glucose-Capillary: 146 mg/dL — ABNORMAL HIGH (ref 70–99)

## 2020-08-07 NOTE — Plan of Care (Signed)

## 2020-08-07 NOTE — Progress Notes (Signed)
Pt with 11 beats VT per telemetry. Pt asymptomatic, vs wnl other than HR 57. Dr. Loney Loh informed.

## 2020-08-07 NOTE — Progress Notes (Signed)
  Speech Language Pathology Treatment: Cognitive-Linquistic;Dysphagia  Patient Details Name: Justin Mason MRN: [id number] DOB: 1981-06-23 Today's Date: 08/07/2020 Time: 2202-5427 SLP Time Calculation (min) (ACUTE ONLY): 26 min  Assessment / Plan / Recommendation Clinical Impression  Treatment focused on apraxia/aphasia and dysphagia. Exhibited dysgraphia with writing names of object or with semantic cue. First name written accurately and "cup" when presented with object. Asked to write how he would relay he was thirsty without discernible approximations or use gesture (last week was accurate) but identified picture on communication board.  Therapist utilized methods for vocalization including familiar songs/videos, verbal cues to vocalize while he yawned, melodic intonation. Did not attempt oral-motor and articulatory placement today due to tray present as getting cold.  He consumed large bites of spaghetti and packing right buccal cavity. Verbal/visual assist with tongue not effective but liquid wash with straw tea cleared. Immediate cough but indicative of bolus cohesion challenges with multi consistencies and likely releable sensation (no aspiration during FEES). Therapist checked on pt 5 minutes after session and right cheek remained clear. Continue regular (rehab may consider Dys 3), thin and assist. Hopeful transfer to CIR soon.   HPI HPI: Justin Mason is a 40 y.o. male with a history of hypertension, hyperlipidemia, diabetes, morbid obesity who presents with difficulty speaking. CT angiogram which revealed significant intracranial atherosclerosis with an occluded left M1.  CT perfusion was suggestive of a large area of hypoperfusion without core infarct on the left. CT small foci of hypodensity within the left caudate and left thalamus, may represent small infarcts, age indeterminate. Per MD note, at the time of angiogram it was discovered that the blockage was likely chronic and therefore no  attempt at reopening it was performed.      SLP Plan  Continue with current plan of care       Recommendations  Diet recommendations: Regular;Thin liquid Liquids provided via: Cup;Straw Medication Administration: Crushed with puree Supervision: Patient able to self feed;Staff to assist with self feeding;Full supervision/cueing for compensatory strategies Compensations: Slow rate;Minimize environmental distractions;Small sips/bites;Lingual sweep for clearance of pocketing Postural Changes and/or Swallow Maneuvers: Seated upright 90 degrees                Oral Care Recommendations: Oral care BID Follow up Recommendations: Inpatient Rehab SLP Visit Diagnosis: Aphasia (R47.01);Apraxia (R48.2);Dysphagia, unspecified (R13.10) Plan: Continue with current plan of care       GO                [name] 08/07/2020, 12:41 PM   Breck Coons Lonell Face.Ed Nurse, children's [phone] Office [phone]

## 2020-08-07 NOTE — Progress Notes (Signed)
HOSPITAL MEDICINE OVERNIGHT EVENT NOTE    Notified by nursing the patient is exhibiting a yellow MEWS score.  Chart reviewed, patient is currently hospitalized for CVA with severe right MCA stenosis incidentally found to be covid positive.   Per my discussion with nursing, patient has exhibited no significant change in symptoms as of late.  Patient is exhibiting no evidence of respiratory distress or confusion.  Patient exhibits no evidence of fever.  Patient is hemodynamically stable.  Patient currently has no complaints.  Continue current plan of care.  Continuing to monitor patient closely.  Marinda Elk  MD [facility]

## 2020-08-07 NOTE — Progress Notes (Signed)
Physical Therapy Treatment Patient Details Name: Justin Mason MRN: [id number] DOB: [birth year] Today's Date: 08/07/2020    History of Present Illness 40 yo male with PMH of morbid obesity, DM2, HLD, HTN, known COVID-19 infection (dx less than 1 week PTA), history of shoulder surgery with residual R shoulder who presented to ED 1/19 with aphasia and RUE weakness beginning at about 1030 AM 07/24/2020 and progressing until 1230 same day with R facial droop. 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  Outside of window for tpa. Went to IR for revascularization but angioplasty of MCA unsuccessful due to resistance.    PT Comments    Focused session on neuromuscular training to improve independence with functional mobility, primarily bed mobility this date. Facilitated neuro activation in R limbs via rhythmic initiation in L sidelying with no noted muscle activation in R arm but positive muscle activation noted in R gluteus medius and maximus. Also, performed modified sit-ups to increase trunk activation for rolling to provide pressure relief as he is developing redness at his buttocks and R heel (discomfort noted at L heel also), notified RN and requested order for prevalon boots. Progressed from requiring maxA to roll to L for entire duration of movement to only needing modA to initiate/gain momentum. Pt positioned rolled to L, sacral bandage applied, and heels floating to prevent progression of decline in skin integrity. Will continue to follow acutely. Current recommendations remain appropriate.   Follow Up Recommendations  CIR;Supervision/Assistance - 24 hour     Equipment Recommendations  Other (comment) (TBD)    Recommendations for Other Services       Precautions / Restrictions Precautions Precautions: Fall Precaution Comments: R inattention, R side flaccid Restrictions Weight Bearing Restrictions:  No    Mobility  Bed Mobility Overal bed mobility: Needs Assistance Bed Mobility: Rolling Rolling: Max assist         General bed mobility comments: Pt able to roll towards R with use of L hand on R bed rail with minA, x2 reps. Practiced rhythmic initiation on R limbs via rolling towards L, cuing pt to gain momentum through trunk rocking. 10x focusing on R leg rolling anteriorly and posteriorly, noting muscle activation with palpation in gluteus medius and maximus.10x additionally focusing on R arm rolling anteriorly and posteriorly, no muscle activation in arm noted. Progressed from requiring maxA initially to only needing modA towards the final bouts, primarily for initiation of momentum.  Transfers                 General transfer comment: Focused on bed mob and exercise in bed today  Ambulation/Gait             General Gait Details: Focused on bed mob and exercise in bed today   Stairs             Wheelchair Mobility    Modified Rankin (Stroke Patients Only) Modified Rankin (Stroke Patients Only) Pre-Morbid Rankin Score: No symptoms Modified Rankin: Severe disability     Balance                                            Cognition Arousal/Alertness: Awake/alert Behavior During Therapy: WFL for tasks assessed/performed Overall Cognitive Status: Difficult to assess Area of Impairment: Safety/judgement;Awareness;Problem solving;Following commands  Following Commands: Follows one step commands with increased time;Follows one step commands consistently Safety/Judgement: Decreased awareness of safety;Decreased awareness of deficits Awareness: Emergent Problem Solving: Difficulty sequencing;Requires verbal cues;Requires tactile cues General Comments: pt following all commands and nodding appropriately during session, however pt making no effort to verbalize during session. Intermittent grunts with difficult  activities.      Exercises Other Exercises Other Exercises: Rhythmic initiation in L sidelying: 10x focusing on R leg flexion and extension rolling anterior <> posterior, muscle activation in glut med and max noted with palpation Other Exercises: Rhythmic initiation in L sidelying: 10x focusing on R arm flexion and extension rolling anterior <> posterior, no muscle activation in arm noted. Other Exercises: Modified sit-ups with HOB elevated and sheet behind pt's upper trunk, 10x AAROM with pt pulling sheet ends anterior to pt to assist, cued for proper breathing in prior and out during sit-up rep    General Comments General comments (skin integrity, edema, etc.): Redness at buttocks and R lateral heel with pt noting some discomfort at L heel with palpation; floated heels and rolled pt towards L end of session, notified RN and requested RN order prevalon boots for pt      Pertinent Vitals/Pain Pain Assessment: No/denies pain Pain Intervention(s): Monitored during session    Home Living                      Prior Function            PT Goals (current goals can now be found in the care plan section) Acute Rehab PT Goals Patient Stated Goal: get pt as independent as possible PT Goal Formulation: With patient Time For Goal Achievement: 08/09/20 Potential to Achieve Goals: Good Progress towards PT goals: Progressing toward goals    Frequency    Min 4X/week      PT Plan Current plan remains appropriate    Co-evaluation              AM-PAC PT "6 Clicks" Mobility   Outcome Measure  Help needed turning from your back to your side while in a flat bed without using bedrails?: A Lot Help needed moving from lying on your back to sitting on the side of a flat bed without using bedrails?: A Lot Help needed moving to and from a bed to a chair (including a wheelchair)?: Total Help needed standing up from a chair using your arms (e.g., wheelchair or bedside chair)?: A  Lot Help needed to walk in hospital room?: Total Help needed climbing 3-5 steps with a railing? : Total 6 Click Score: 9    End of Session   Activity Tolerance: Patient tolerated treatment well Patient left: with call bell/phone within reach;with family/visitor present;in bed;with bed alarm set Nurse Communication: Mobility status;Other (comment) (redness at buttocks and R heel, need for prevalon boots) PT Visit Diagnosis: Hemiplegia and hemiparesis;Other abnormalities of gait and mobility (R26.89);Unsteadiness on feet (R26.81);Muscle weakness (generalized) (M62.81);Difficulty in walking, not elsewhere classified (R26.2);Other symptoms and signs involving the nervous system (R29.898) Hemiplegia - Right/Left: Right Hemiplegia - dominant/non-dominant: Dominant Hemiplegia - caused by: Cerebral infarction     Time: 4196-2229 PT Time Calculation (min) (ACUTE ONLY): 49 min  Charges:  $Therapeutic Activity: 23-37 mins $Neuromuscular Re-education: 8-22 mins                     Raymond Gurney, PT, DPT Acute Rehabilitation Services  Pager: [phone] Office: [phone]    Henrene Dodge Pettis  08/07/2020, 6:13 PM

## 2020-08-07 NOTE — Progress Notes (Signed)
PROGRESS NOTE    Justin Mason  QMV:[id number] DOB: 1981/05/15 DOA: 07/24/2020 PCP: Eber Jones, NP   Brief Narrative:   40 yo M PMH morbid obesity, DM2, HLD, HTN, known COVID-19 infection (dx less than 1 week PTA) who presented to ED 1/19 with aphasia and RUE weakness beginning at about 1030 AM 07/24/2020 and progressing until 1230 same day with R facial droop. CT H with concern for L sided infarcts CTA with short segment occlusion of R M1/MCA, L ICA occlusion, proximal L M1, stenosis of PCAs. Outside of window for tpa. Went to NIR1/19/22for revascularization but angioplasty of MCA unsuccessful due to resistance. Patient continues to progress daily with speech and PT - on covid precautions through 08/02/20 which limits disposition. Hopefully now that off covid (as of 08/03/20) he will be able to dispo to CIR vs SNF given ongoing needs. CIR currently following for evaluation. Medically stable for discharge if they agree he meets criteria.  Assessment & Plan:  L ICA occlusion, severe R MCA stenosis - Presenting with right side weakness and aphasia - Unsuccessful revascularization due to chronicity - Cause of decompensation of chronic disease unclear - possible hypoglycemic episode.  - Neuro eval'd: DAPT for 3 months then transition to single agent - Continue statin - SLP following - diet improving - back to regular/thins currently - 1/25-2/1: Continuing to work with rehab services; still working on placement (limited due to previous covid/quarantine precautions).  Incidental covid positive - Acute respiratory failure post procedure - Hypoxia due to procedure; resolved - No current indication for COVID treatment   - Off quarantine 08/03/20 - 1/25: Stable on RA  Hypertension.  - Completed permissive HTN window - HCTZ, irbesartan, clonidine, metoprolol - BP stable - continue to follow  Hypokalemia - Resolved - no longer following given improved PO intake  DVT prophylaxis:  Heparin Code Status: FULL Family Communication: Mother updated at length about prognosis/disposition Status is: Inpatient  Remains inpatient appropriate because:Inpatient level of care appropriate due to severity of illness  Dispo: The patient is from: Home             Anticipated d/c is to: SNF vs CIR             Anticipated d/c date is: 24-48h             Patient currently is medically stable to d/c.  Difficult to place patient No  Consultants:   Neurology  Subjective: No acute events overnight - still poorly verbal but smiling this am and much more pleasant/hopeful than last week.  Objective: Vitals:   08/06/20 2124 08/07/20 0033 08/07/20 0202 08/07/20 0405  BP: 111/73 99/65 132/70 110/62  Pulse: 65 (!) 54 62 (!) 57  Resp:  12 13 13   Temp:  99 F (37.2 C) (!) 97.4 F (36.3 C) 97.7 F (36.5 C)  TempSrc:  Oral Oral Oral  SpO2:  97% 98% 98%  Weight:       No intake or output data in the 24 hours ending 08/07/20 0747 Filed Weights   07/24/20 1615 07/25/20 0604 07/26/20 1540  Weight: (!) 172.8 kg (!) 172.6 kg (!) 166.3 kg   Examination:  General: 40 y.o. male resting in bed in NAD Eyes: PERRL, normal sclera ENMT: Nares patent w/o discharge, orophaynx clear, dentition normal, ears w/o discharge/lesions/ulcers Neck: Supple, trachea midline Cardiovascular: RRR, +S1, S2, no m/g/r, equal pulses throughout Respiratory: CTABL, no w/r/r, normal WOB GI: BS+, NDNT, no masses noted, no organomegaly noted  Neuro: Alert, following commands, RUE/RLE profound weakness/diminished sensation  Data Reviewed: I have personally reviewed following labs and imaging studies  CBC: No results for input(s): WBC, NEUTROABS, HGB, HCT, MCV, PLT in the last 168 hours. Basic Metabolic Panel: No results for input(s): NA, K, CL, CO2, GLUCOSE, BUN, CREATININE, CALCIUM, MG, PHOS in the last 168 hours. GFR: Estimated Creatinine Clearance: 167.3 mL/min (by C-G formula based on SCr of 0.96 mg/dL).    Liver Function Tests: No results for input(s): AST, ALT, ALKPHOS, BILITOT, PROT, ALBUMIN in the last 168 hours. No results for input(s): LIPASE, AMYLASE in the last 168 hours. No results for input(s): AMMONIA in the last 168 hours.  Coagulation Profile: No results for input(s): INR, PROTIME in the last 168 hours. Cardiac Enzymes: No results for input(s): CKTOTAL, CKMB, CKMBINDEX, TROPONINI in the last 168 hours.  BNP (last 3 results) No results for input(s): PROBNP in the last 8760 hours.  HbA1C: No results for input(s): HGBA1C in the last 72 hours.  CBG: Recent Labs  Lab 08/06/20 0621 08/06/20 1211 08/06/20 1652 08/06/20 2111 08/07/20 0602  GLUCAP 157* 124* 212* 128* 157*   Lipid Profile: No results for input(s): CHOL, HDL, LDLCALC, TRIG, CHOLHDL, LDLDIRECT in the last 72 hours.  Thyroid Function Tests: No results for input(s): TSH, T4TOTAL, FREET4, T3FREE, THYROIDAB in the last 72 hours.  Anemia Panel: No results for input(s): VITAMINB12, FOLATE, FERRITIN, TIBC, IRON, RETICCTPCT in the last 72 hours.  Sepsis Labs: No results for input(s): PROCALCITON, LATICACIDVEN in the last 168 hours. No results found for this or any previous visit (from the past 240 hour(s)).  Radiology Studies: No results found.  Scheduled Meds: . vitamin C  500 mg Oral Daily  . aspirin  300 mg Rectal Daily   Or  . aspirin  325 mg Oral Daily  . cholecalciferol  1,000 Units Oral QPM  . cloNIDine  0.2 mg Oral BID  . feeding supplement  237 mL Oral BID BM  . heparin injection (subcutaneous)  7,500 Units Subcutaneous Q8H  . hydrochlorothiazide  25 mg Oral Daily  . insulin aspart  0-15 Units Subcutaneous TID AC & HS  . irbesartan  300 mg Oral Daily  . mouth rinse  15 mL Mouth Rinse BID  . meloxicam  15 mg Oral Daily  . metFORMIN  1,000 mg Oral BID WC  . metoprolol tartrate  100 mg Oral BID  . multivitamin with minerals  1 tablet Oral Daily  . pantoprazole sodium  40 mg Oral Daily  .  potassium chloride  40 mEq Oral Daily  . rosuvastatin  10 mg Oral Daily  . sertraline  25 mg Oral Daily  . zinc sulfate  220 mg Oral QPM    LOS: 14 days   Time spent: 25 minutes  Azucena Fallen, DO [facility]  If 7PM-7AM, please contact night-coverage [email or web address] 08/07/2020, 7:47 AM

## 2020-08-07 NOTE — Progress Notes (Signed)
Inpatient Rehab Admissions Coordinator:   I have insurance auth for CIR admit for this pt but do not have a bed to offer him today. Will continue to follow for potential admit pending bed availability.   Megan Salon, MS, CCC-SLP Rehab Admissions Coordinator  [phone] (celll) [phone] (office)

## 2020-08-08 LAB — GLUCOSE, CAPILLARY
Glucose-Capillary: 147 mg/dL — ABNORMAL HIGH (ref 70–99)
Glucose-Capillary: 175 mg/dL — ABNORMAL HIGH (ref 70–99)
Glucose-Capillary: 138 mg/dL — ABNORMAL HIGH (ref 70–99)
Glucose-Capillary: 137 mg/dL — ABNORMAL HIGH (ref 70–99)
Glucose-Capillary: 161 mg/dL — ABNORMAL HIGH (ref 70–99)

## 2020-08-08 LAB — MAGNESIUM: Magnesium: 1.9 mg/dL (ref 1.7–2.4)

## 2020-08-08 LAB — POTASSIUM: Potassium: 4 mmol/L (ref 3.5–5.1)

## 2020-08-08 NOTE — Progress Notes (Signed)
Nutrition Follow-up  DOCUMENTATION CODES:   Not applicable  INTERVENTION:   Continue Ensure Enlive po BID, each supplement provides 350 kcal and 20 grams of protein   Continue mvi with minerals daily  NUTRITION DIAGNOSIS:   Increased nutrient needs related to acute illness (COVID infection) as evidenced by estimated needs; ongoing  GOAL:   Patient will meet greater than or equal to 90% of their needs; progressing  MONITOR:   PO intake,Supplement acceptance,Skin,Weight trends,Labs,I & O's  REASON FOR ASSESSMENT:   Other (Comment)    ASSESSMENT:   Patient with PMH significant for HLD, DM, HTN, and systolic murmur. Presents this admission with acute MCA stroke and known COVID 19 infection.  1/19 - intubated; NIR unsuccessful angioplasty of MCA  1/20 - extubated 1/21 - diet advanced to dysphagia 2 with thin liquids s/p FEES 1/22 - diet advanced to dysphagia 3 with thin liquids 1/24 - diet advanced to carb modified with thin liquids  Meal completion has been varied from 15-90%. Pt currently has Ensure ordered and has been consuming them. RD to continue with current orders to aid in caloric and protein needs. Plans for CIR once bed available.     Labs and medications reviewed.   Diet Order:   Diet Order            Diet Carb Modified Fluid consistency: Thin; Room service appropriate? No  Diet effective now                 EDUCATION NEEDS:   Not appropriate for education at this time  Skin:  Skin Assessment: Reviewed RN Assessment Skin Integrity Issues:: Incisions Incisions: R groin  Last BM:  2/1  Height:   Ht Readings from Last 1 Encounters:  07/12/20 6\' 1"  (1.854 m)    Weight:   Wt Readings from Last 1 Encounters:  08/08/20 (!) 159.1 kg    BMI:  Body mass index is 46.28 kg/m.  Estimated Nutritional Needs:   Kcal:  2300-2500 kcal  Protein:  115-130 grams  Fluid:  >/= 2 L/day  10/06/20, MS, RD, LDN RD pager number/after hours  weekend pager number on Amion.

## 2020-08-08 NOTE — Progress Notes (Addendum)
  Speech Language Pathology Treatment: Cognitive-Linquistic  Patient Details Name: Justin Mason MRN: [id number] DOB: 1981/01/09 Today's Date: 08/08/2020 Time: 1751-0258 SLP Time Calculation (min) (ACUTE ONLY): 27 min  Assessment / Plan / Recommendation Clinical Impression  Session focused on articulatory placement and initiation of phonation with pt sitting in chair. Therapist utilized Administrator of therapists lips through translucent face mask, manual assist of labial placement. He was able to imitate labial/lingual placement for labial closure, lingual elevation behind upper dentition, labial retraction and protrusion with 60% acc. Therapist then demonstrated vocalic shaping using /ew/ moving into /e/ with 40% acc. A metronome was used and Justin Mason able to imitate sound/rhythm by tapping finger on leg with 100% for faster beats and 60% with slower ones. He moved lips slightly in unison with familiar song with max visual/verbal stimuli. Wrote his name independently buy 0% accurate in attempts to write girlfriend's name.  Justin Mason followed one step commands to 90%. His cognition has significantly improved and he demonstrated functional problem solving throughout session. Right sided inattention persists.    HPI HPI: Justin Mason is a 40 y.o. male with a history of hypertension, hyperlipidemia, diabetes, morbid obesity who presents with difficulty speaking. CT angiogram which revealed significant intracranial atherosclerosis with an occluded left M1.  CT perfusion was suggestive of a large area of hypoperfusion without core infarct on the left. CT small foci of hypodensity within the left caudate and left thalamus, may represent small infarcts, age indeterminate. Per MD note, at the time of angiogram it was discovered that the blockage was likely chronic and therefore no attempt at reopening it was performed.      SLP Plan  Continue with current plan of care       Recommendations                    General recommendations: Rehab consult Oral Care Recommendations: Oral care BID Follow up Recommendations: Inpatient Rehab SLP Visit Diagnosis: Aphasia (R47.01);Apraxia (R48.2);Dysphagia, unspecified (R13.10) Plan: Continue with current plan of care       GO                [name] 08/08/2020, 5:09 PM

## 2020-08-08 NOTE — Progress Notes (Signed)
Occupational Therapy Treatment Patient Details Name: Justin Mason MRN: [id number] DOB: 03/05/1981 Today's Date: 08/08/2020    History of present illness 40 yo male with PMH of morbid obesity, DM2, HLD, HTN, known COVID-19 infection (dx less than 1 week PTA), history of shoulder surgery with residual R shoulder who presented to ED 1/19 with aphasia and RUE weakness beginning at about 1030 AM 07/24/2020 and progressing until 1230 same day with R facial droop. 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  Outside of window for tpa. Went to IR for revascularization but angioplasty of MCA unsuccessful due to resistance.   OT comments  OT treatment session with focus on self-care re-education utilizing hemi compensatory techniques, RUE NMR, sit to stand transfers, and activity tolerance. Patient completed sit to stand x4 in [place] with Mod A +2 and hand over hand assist to maintain position of RUE. Education on protection of RUE shoulder joint at rest and with activity. Patient with no attempt to verbalize, but nodded his head in understanding. 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 OT will continue to follow acutely.    Follow Up Recommendations  CIR    Equipment Recommendations  Wheelchair (measurements OT);Wheelchair cushion (measurements OT);3 in 1 bedside commode    Recommendations for Other Services Rehab consult    Precautions / Restrictions Precautions Precautions: Fall Precaution Comments: R inattention, R side flaccid Restrictions Weight Bearing Restrictions: No       Mobility Bed Mobility Overal bed mobility: Needs Assistance             General bed mobility comments: Seated in recliner upon entry.  Transfers Overall transfer level:  Needs assistance Equipment used: Ambulation equipment used Transfers: Sit to/from Stand Sit to Stand: Mod assist;+2 safety/equipment         General transfer comment: Sit to stand x4 in [place].    Balance   Sitting-balance support: Single extremity supported;Feet supported Sitting balance-Leahy Scale: Good Sitting balance - Comments: pt able to maintain sitting without UE support and accept moderate challenge   Standing balance support: Bilateral upper extremity supported;During functional activity Standing balance-Leahy Scale: Poor Standing balance comment: heavy reliance on LUE support and external support                           ADL either performed or assessed with clinical judgement   ADL Overall ADL's : Needs assistance/impaired     Grooming: Wash/dry hands;Applying deodorant;Minimal assistance;Sitting Grooming Details (indicate cue type and reason): Patient able to wash face seated in recliner with set-up assist. Would likely require Min A for oral hygiene 2/2 flaccid paralysis in RUE. Upper Body Bathing: Moderate assistance;Sitting (progressing to Min A) Upper Body Bathing Details (indicate cue type and reason): Patient able to wash RUE, chest and abdomen. Cues for hemi technique to wash LUE seated in recliner.     Upper Body Dressing : Moderate assistance;Sitting Upper Body Dressing Details (indicate cue type and reason): Education on hemi technique and Mod A to don anterior hospital gown.     Toilet Transfer: Moderate assistance;+2 for physical assistance;Total assistance Toilet Transfer Details (indicate cue type and reason): MOD A +2 to stand to stedy, total A with stedy to pivot to recliner Toileting-  Clothing Manipulation and Hygiene: Total assistance;Sit to/from stand Toileting - Clothing Manipulation Details (indicate cue type and reason): Total A for hygiene management in standing with use of Stedy.             Vision       Perception      Praxis      Cognition Arousal/Alertness: Awake/alert Behavior During Therapy: WFL for tasks assessed/performed Overall Cognitive Status: Difficult to assess Area of Impairment: Awareness;Problem solving                         Safety/Judgement: Decreased awareness of safety;Decreased awareness of deficits Awareness: Emergent Problem Solving: Difficulty sequencing;Requires verbal cues;Requires tactile cues General Comments: Patient answers yes/no questions appropriately. Occasionally mouths things but no attempts to verbalize this date.        Exercises Exercises: Other exercises   Shoulder Instructions       General Comments Mepilex boarder replaced in standing with RN.    Pertinent Vitals/ Pain       Pain Assessment: No/denies pain Pain Intervention(s): Monitored during session  Home Living                                          Prior Functioning/Environment              Frequency  Min 2X/week        Progress Toward Goals  OT Goals(current goals can now be found in the care plan section)  Progress towards OT goals: Progressing toward goals  Acute Rehab OT Goals Patient Stated Goal: get pt as independent as possible OT Goal Formulation: With patient Potential to Achieve Goals: Good ADL Goals Pt Will Perform Upper Body Dressing: with min assist;sitting Pt Will Perform Lower Body Dressing: sit to/from stand;with max assist Pt Will Perform Toileting - Clothing Manipulation and hygiene: with max assist;sit to/from stand  Plan Discharge plan remains appropriate;Frequency remains appropriate    Co-evaluation                 AM-PAC OT "6 Clicks" Daily Activity     Outcome Measure   Help from another person eating meals?: A Little Help from another person taking care of personal grooming?: A Little Help from another person toileting, which includes using toliet, bedpan, or urinal?: A Lot Help from another person  bathing (including washing, rinsing, drying)?: A Lot Help from another person to put on and taking off regular upper body clothing?: A Lot Help from another person to put on and taking off regular lower body clothing?: Total 6 Click Score: 13    End of Session Equipment Utilized During Treatment: Other (comment) Antony Salmon)  OT Visit Diagnosis: Unsteadiness on feet (R26.81);Muscle weakness (generalized) (M62.81)   Activity Tolerance Patient tolerated treatment well   Patient Left in chair;with call bell/phone within reach;with family/visitor present   Nurse Communication Other (comment) (Need for dressing change.)        Time: 0160-1093 OT Time Calculation (min): 27 min  Charges: OT General Charges $OT Visit: 1 Visit OT Treatments $Self Care/Home Management : 23-37 mins  Shanquita Ronning H. OTR/L Supplemental OT, Department of rehab services [phone]   Clovia Reine R H. 08/08/2020, 12:50 PM

## 2020-08-08 NOTE — Discharge Summary (Addendum)
Physician Discharge Summary  Justin Mason:[id number] DOB: Feb 02, 1981 DOA: 07/24/2020  PCP: Barnetta Chapel, NP  Admit date: 07/24/2020 Discharge date: 08/09/2020  Admitted From: Home Disposition: CIR  Recommendations for Outpatient Follow-up:  1. Follow up with PCP in 1-2 weeks 2. Please obtain BMP/CBC in one week 3. Please follow up with neurology as scheduled:  Discharge Condition: Stable CODE STATUS: Full Diet recommendation: Diabetic diet, low-salt low-fat  Brief/Interim Summary: 40 yo M PMH morbid obesity, DM2, HLD, HTN, known COVID-19 infection (dx less than 1 week PTA) who presented to ED 1/19 with aphasia and RUE weakness beginning at about 1030 AM 07/24/2020 and progressing until 1230 same day with R facial droop. CT H with concern for L sided infarcts CTA with short segment occlusion of R M1/MCA, L ICA occlusion, proximal L M1, stenosis of PCAs. Outside of window for tpa. Went to [facility]for revascularization but angioplasty of MCA unsuccessful due to resistance. Patient continues to progress daily with speech and PT - on covid precautions through 08/02/20 which limits disposition. Off covid precautions (as of 08/03/20) awaiting placement. CIR currently awaiting open bed, patient remains medically stable for discharge once a bed has become available, insurance has been approved at this point.  Patient met as above with acute aphasia and weakness found to have large infarcts on imaging as above.  Neurology following, attempted revascularization with angioplasty but failed, patient has been stable since that time, minimal improvements in weakness, aphasia but otherwise clinically stable otherwise.  Patient being evaluated and admitted to inpatient rehab for ongoing physical therapy, speech therapy and medication management as outlined.  Patient otherwise stable and agreeable for discharge  Discharge Diagnoses:  Active Problems:   Stroke (cerebrum) (HCC)   Middle cerebral artery  stenosis, left    Discharge Instructions  Discharge Instructions    Change dressing (specify)   Complete by: As directed    Dressing change: 2 times per day   Diet - low sodium heart healthy   Complete by: As directed    Increase activity slowly   Complete by: As directed      Allergies as of 08/09/2020   No Known Allergies     Medication List    STOP taking these medications   amLODipine 10 MG tablet Commonly known as: NORVASC   esomeprazole 20 MG capsule Commonly known as: NEXIUM   metoprolol succinate 100 MG 24 hr tablet Commonly known as: TOPROL-XL     TAKE these medications   allopurinol 100 MG tablet Commonly known as: ZYLOPRIM Take 100 mg by mouth as needed (gout).   aspirin 325 MG tablet Take 1 tablet (325 mg total) by mouth daily.   cloNIDine 0.1 MG tablet Commonly known as: Catapres Take 1 tablet (0.1 mg total) by mouth 2 (two) times daily.   dapagliflozin propanediol 10 MG Tabs tablet Commonly known as: FARXIGA Take 10 mg by mouth daily.   feeding supplement Liqd Take 237 mLs by mouth 2 (two) times daily between meals.   meloxicam 15 MG tablet Commonly known as: MOBIC Take 15 mg by mouth daily.   metFORMIN 1000 MG tablet Commonly known as: GLUCOPHAGE Take 1,000 mg by mouth 2 (two) times daily.   metoprolol tartrate 100 MG tablet Commonly known as: LOPRESSOR Take 1 tablet (100 mg total) by mouth 2 (two) times daily.   multivitamin with minerals Tabs tablet Take 1 tablet by mouth daily.   olmesartan-hydrochlorothiazide 40-25 MG tablet Commonly known as: BENICAR HCT Take 1 tablet  by mouth daily.   pantoprazole sodium 40 mg/20 mL Pack Commonly known as: PROTONIX Take 20 mLs (40 mg total) by mouth daily for 30 doses.   potassium chloride 20 MEQ packet Commonly known as: KLOR-CON Take 40 mEq by mouth daily.   rosuvastatin 10 MG tablet Commonly known as: CRESTOR Take 1 tablet (10 mg total) by mouth daily.   sertraline 25 MG  tablet Commonly known as: ZOLOFT Take 1 tablet (25 mg total) by mouth daily.   VITAMIN C PO Take 1 tablet by mouth every evening.   Vitamin D-3 25 MCG (1000 UT) Caps Take 1,000 Units by mouth every evening.   Zinc 50 MG Tabs Take 200 mg by mouth every evening.            Discharge Care Instructions  (From admission, onward)         Start     Ordered   08/09/20 0000  Change dressing (specify)       Comments: Dressing change: 2 times per day   08/09/20 0717          No Known Allergies  Consultations:  Neuro, PCCM, IR   Procedures/Studies: CT Code Stroke CTA Head W/WO contrast  Result Date: 07/24/2020 CLINICAL DATA:  Focal neurological deficit, stroke suspected. Aphasia. EXAM: CT ANGIOGRAPHY HEAD AND NECK CT PERFUSION BRAIN TECHNIQUE: Multidetector CT imaging of the head and neck was performed using the standard protocol during bolus administration of intravenous contrast. Multiplanar CT image reconstructions and MIPs were obtained to evaluate the vascular anatomy. Carotid stenosis measurements (when applicable) are obtained utilizing NASCET criteria, using the distal internal carotid diameter as the denominator. Multiphase CT imaging of the brain was performed following IV bolus contrast injection. Subsequent parametric perfusion maps were calculated using RAPID software. COMPARISON:  Head CT July 24, 2020 FINDINGS: CTA NECK FINDINGS Aortic arch: Common origin of the right common and innominate arteries from the aortic arch. Imaged portion shows no evidence of aneurysm or dissection. No significant stenosis of the major arch vessel origins. Right carotid system: No evidence of dissection, stenosis (50% or greater) or occlusion. Left carotid system: No evidence of dissection, stenosis (50% or greater) or occlusion. Vertebral arteries: Right dominant. Proximal left vertebral artery is is partially obscured by motion and streak artifacts. Otherwise, no evidence of dissection,  stenosis (50% or greater) or occlusion. Skeleton: Degenerative changes of the cervical spine Other neck: Negative Upper chest: Scattered opacities within the bilateral upper lobes. Correlation with dedicated chest study suggested. Review of the MIP images confirms the above findings CTA HEAD FINDINGS Anterior circulation: Short segment occlusion of the proximal right M1/MCA with decrease caliber and opacification of the corresponding vascular tree. Occlusion of the left ICA terminus and proximal left M1 segment with decrease caliber and contrast opacification of the corresponding vascular tree. The bilateral ACA vascular trees are patent, although caliber appear diminished with luminal irregularity bilaterally. Posterior circulation: Intracranial bilateral vertebral arteries and basilar artery have normal or a noting the is caliber diffusely. Luminal irregularity the of the bilateral posterior cerebral arteries with moderate stenosis at the right P2 segment. Venous sinuses: As permitted by contrast timing, patent. Anatomic variants: Left fetal PCA. Review of the MIP images confirms the above findings CT Brain Perfusion Findings: ASPECTS: 10 CBF (<30%) Volume: 6m Perfusion (Tmax>6.0s) volume: 1931mMismatch Volume: 19368mnfarction Location:Not applicable IMPRESSION: 1. Short segment occlusion of the proximal right M1/MCA with decrease caliber and opacification of the corresponding vascular tree. 2. Occlusion of the  left ICA terminus and proximal left M1 segment with decrease caliber and contrast opacification of the corresponding vascular tree. 3. Luminal irregularity of the bilateral ACA and PCA vascular trees with moderate stenosis at the right P2/PCA segment. 4. Diffuse intracranial vasculopathy may be related to severe atherosclerosis versus vasculitis. 5. Scattered opacities within the bilateral upper lobes. Correlation with dedicated chest study suggested. These results were called by telephone at the time of  interpretation on 07/24/2020 at 4:12 pm to provider MCNEILL [facility] , who verbally acknowledged these results. Electronically Signed   By: Pedro Earls M.D.   On: 07/24/2020 16:27   CT Code Stroke CTA Neck W/WO contrast  Result Date: 07/24/2020 CLINICAL DATA:  Focal neurological deficit, stroke suspected. Aphasia. EXAM: CT ANGIOGRAPHY HEAD AND NECK CT PERFUSION BRAIN TECHNIQUE: Multidetector CT imaging of the head and neck was performed using the standard protocol during bolus administration of intravenous contrast. Multiplanar CT image reconstructions and MIPs were obtained to evaluate the vascular anatomy. Carotid stenosis measurements (when applicable) are obtained utilizing NASCET criteria, using the distal internal carotid diameter as the denominator. Multiphase CT imaging of the brain was performed following IV bolus contrast injection. Subsequent parametric perfusion maps were calculated using RAPID software. COMPARISON:  Head CT July 24, 2020 FINDINGS: CTA NECK FINDINGS Aortic arch: Common origin of the right common and innominate arteries from the aortic arch. Imaged portion shows no evidence of aneurysm or dissection. No significant stenosis of the major arch vessel origins. Right carotid system: No evidence of dissection, stenosis (50% or greater) or occlusion. Left carotid system: No evidence of dissection, stenosis (50% or greater) or occlusion. Vertebral arteries: Right dominant. Proximal left vertebral artery is is partially obscured by motion and streak artifacts. Otherwise, no evidence of dissection, stenosis (50% or greater) or occlusion. Skeleton: Degenerative changes of the cervical spine Other neck: Negative Upper chest: Scattered opacities within the bilateral upper lobes. Correlation with dedicated chest study suggested. Review of the MIP images confirms the above findings CTA HEAD FINDINGS Anterior circulation: Short segment occlusion of the proximal right M1/MCA  with decrease caliber and opacification of the corresponding vascular tree. Occlusion of the left ICA terminus and proximal left M1 segment with decrease caliber and contrast opacification of the corresponding vascular tree. The bilateral ACA vascular trees are patent, although caliber appear diminished with luminal irregularity bilaterally. Posterior circulation: Intracranial bilateral vertebral arteries and basilar artery have normal or a noting the is caliber diffusely. Luminal irregularity the of the bilateral posterior cerebral arteries with moderate stenosis at the right P2 segment. Venous sinuses: As permitted by contrast timing, patent. Anatomic variants: Left fetal PCA. Review of the MIP images confirms the above findings CT Brain Perfusion Findings: ASPECTS: 10 CBF (<30%) Volume: 52m Perfusion (Tmax>6.0s) volume: 1941mMismatch Volume: 19365mnfarction Location:Not applicable IMPRESSION: 1. Short segment occlusion of the proximal right M1/MCA with decrease caliber and opacification of the corresponding vascular tree. 2. Occlusion of the left ICA terminus and proximal left M1 segment with decrease caliber and contrast opacification of the corresponding vascular tree. 3. Luminal irregularity of the bilateral ACA and PCA vascular trees with moderate stenosis at the right P2/PCA segment. 4. Diffuse intracranial vasculopathy may be related to severe atherosclerosis versus vasculitis. 5. Scattered opacities within the bilateral upper lobes. Correlation with dedicated chest study suggested. These results were called by telephone at the time of interpretation on 07/24/2020 at 4:12 pm to provider [facility] verb[facility] acknowledged these results. Electronically Signed  By: Pedro Earls M.D.   On: 07/24/2020 16:27   MR ANGIO HEAD WO CONTRAST  Result Date: 07/25/2020 CLINICAL DATA:  Stroke, follow up EXAM: MRI HEAD WITHOUT CONTRAST MRA HEAD WITHOUT CONTRAST TECHNIQUE: Multiplanar,  multiecho pulse sequences of the brain and surrounding structures were obtained without intravenous contrast. Angiographic images of the head were obtained using MRA technique without contrast. COMPARISON:  07/24/2020 and prior. FINDINGS: MRI HEAD FINDINGS Brain: Sequela of acute left MCA territory infarct. Tiny right parietal restricted diffusion may be artifactual versus an additional acute insult. No intracranial hemorrhage. No midline shift, ventriculomegaly or extra-axial fluid collection. Incidental midline retrocerebellar arachnoid cyst without significant mass effect or parenchymal edema. Vascular: Please see MRA. Skull and upper cervical spine: Normal marrow signal. Sinuses/Orbits: Grossly normal orbits. Pneumatized paranasal sinuses and mastoid air cells. Other: Please note susceptibility artifact limits evaluation of the anterior cranial fossa and face. MRA HEAD FINDINGS Anterior circulation: Short segment occlusion of the right M1 segment, unchanged. Distal right MCA branches remain patent.Proximal left M1 segment occlusion with severely diminished opacification of the distal left MCA branches. Patent right ICA. Occlusion of the left ICA terminus and proximal left A1 segment. Luminal irregularity of the distal left A1 segment. Visualized bilateral ACAs are otherwise patent. Posterior circulation: Patent V4 segments and PICA. Patent basilar and superior cerebellar arteries. Fetal origin of the left PCA. Right PCA appears patent and without significant luminal narrowing. Anatomic variants: Please see above. IMPRESSION: Acute left MCA territory infarct. Occlusions involving the left ICA terminus and left M1 segment with diminished opacification of distal left MCA branches. Short-segment right M1 occlusion is unchanged. Left A1 segment proximal occlusion and vessel wall irregularity, unchanged. Smaller 2-3 mm acute right parietal infarct versus artifact. No intracranial hemorrhage. These results were called  by telephone at the time of interpretation on 07/25/2020 at 4:41 pm to provider Dr. Leonie Man, Who verbally acknowledged these results. Electronically Signed   By: Primitivo Gauze M.D.   On: 07/25/2020 16:43   MR BRAIN WO CONTRAST  Result Date: 07/25/2020 CLINICAL DATA:  Stroke, follow up EXAM: MRI HEAD WITHOUT CONTRAST MRA HEAD WITHOUT CONTRAST TECHNIQUE: Multiplanar, multiecho pulse sequences of the brain and surrounding structures were obtained without intravenous contrast. Angiographic images of the head were obtained using MRA technique without contrast. COMPARISON:  07/24/2020 and prior. FINDINGS: MRI HEAD FINDINGS Brain: Sequela of acute left MCA territory infarct. Tiny right parietal restricted diffusion may be artifactual versus an additional acute insult. No intracranial hemorrhage. No midline shift, ventriculomegaly or extra-axial fluid collection. Incidental midline retrocerebellar arachnoid cyst without significant mass effect or parenchymal edema. Vascular: Please see MRA. Skull and upper cervical spine: Normal marrow signal. Sinuses/Orbits: Grossly normal orbits. Pneumatized paranasal sinuses and mastoid air cells. Other: Please note susceptibility artifact limits evaluation of the anterior cranial fossa and face. MRA HEAD FINDINGS Anterior circulation: Short segment occlusion of the right M1 segment, unchanged. Distal right MCA branches remain patent.Proximal left M1 segment occlusion with severely diminished opacification of the distal left MCA branches. Patent right ICA. Occlusion of the left ICA terminus and proximal left A1 segment. Luminal irregularity of the distal left A1 segment. Visualized bilateral ACAs are otherwise patent. Posterior circulation: Patent V4 segments and PICA. Patent basilar and superior cerebellar arteries. Fetal origin of the left PCA. Right PCA appears patent and without significant luminal narrowing. Anatomic variants: Please see above. IMPRESSION: Acute left MCA  territory infarct. Occlusions involving the left ICA terminus and left M1 segment with  diminished opacification of distal left MCA branches. Short-segment right M1 occlusion is unchanged. Left A1 segment proximal occlusion and vessel wall irregularity, unchanged. Smaller 2-3 mm acute right parietal infarct versus artifact. No intracranial hemorrhage. These results were called by telephone at the time of interpretation on 07/25/2020 at 4:41 pm to provider Dr. Leonie Man, Who verbally acknowledged these results. Electronically Signed   By: Primitivo Gauze M.D.   On: 07/25/2020 16:43   CT Code Stroke Cerebral Perfusion with contrast  Result Date: 07/24/2020 CLINICAL DATA:  Focal neurological deficit, stroke suspected. Aphasia. EXAM: CT ANGIOGRAPHY HEAD AND NECK CT PERFUSION BRAIN TECHNIQUE: Multidetector CT imaging of the head and neck was performed using the standard protocol during bolus administration of intravenous contrast. Multiplanar CT image reconstructions and MIPs were obtained to evaluate the vascular anatomy. Carotid stenosis measurements (when applicable) are obtained utilizing NASCET criteria, using the distal internal carotid diameter as the denominator. Multiphase CT imaging of the brain was performed following IV bolus contrast injection. Subsequent parametric perfusion maps were calculated using RAPID software. COMPARISON:  Head CT July 24, 2020 FINDINGS: CTA NECK FINDINGS Aortic arch: Common origin of the right common and innominate arteries from the aortic arch. Imaged portion shows no evidence of aneurysm or dissection. No significant stenosis of the major arch vessel origins. Right carotid system: No evidence of dissection, stenosis (50% or greater) or occlusion. Left carotid system: No evidence of dissection, stenosis (50% or greater) or occlusion. Vertebral arteries: Right dominant. Proximal left vertebral artery is is partially obscured by motion and streak artifacts. Otherwise, no  evidence of dissection, stenosis (50% or greater) or occlusion. Skeleton: Degenerative changes of the cervical spine Other neck: Negative Upper chest: Scattered opacities within the bilateral upper lobes. Correlation with dedicated chest study suggested. Review of the MIP images confirms the above findings CTA HEAD FINDINGS Anterior circulation: Short segment occlusion of the proximal right M1/MCA with decrease caliber and opacification of the corresponding vascular tree. Occlusion of the left ICA terminus and proximal left M1 segment with decrease caliber and contrast opacification of the corresponding vascular tree. The bilateral ACA vascular trees are patent, although caliber appear diminished with luminal irregularity bilaterally. Posterior circulation: Intracranial bilateral vertebral arteries and basilar artery have normal or a noting the is caliber diffusely. Luminal irregularity the of the bilateral posterior cerebral arteries with moderate stenosis at the right P2 segment. Venous sinuses: As permitted by contrast timing, patent. Anatomic variants: Left fetal PCA. Review of the MIP images confirms the above findings CT Brain Perfusion Findings: ASPECTS: 10 CBF (<30%) Volume: 70m Perfusion (Tmax>6.0s) volume: 1983mMismatch Volume: [id number] Location:Not applicable IMPRESSION: 1. Short segment occlusion of the proximal right M1/MCA with decrease caliber and opacification of the corresponding vascular tree. 2. Occlusion of the left ICA terminus and proximal left M1 segment with decrease caliber and contrast opacification of the corresponding vascular tree. 3. Luminal irregularity of the bilateral ACA and PCA vascular trees with moderate stenosis at the right P2/PCA segment. 4. Diffuse intracranial vasculopathy may be related to severe atherosclerosis versus vasculitis. 5. Scattered opacities within the bilateral upper lobes. Correlation with dedicated chest study suggested. These results were called by  telephone at the time of interpretation on 07/24/2020 at 4:12 pm to provider MCNEILL [facility]who verbally acknowledged these results. Electronically Signed   By: KatPedro EarlsD.   On: 07/24/2020 16:27   DG CHEST PORT 1 VIEW  Result Date: 07/24/2020 CLINICAL DATA:  Intubated EXAM: PORTABLE CHEST 1  VIEW COMPARISON:  09/03/2011 FINDINGS: Endotracheal tube tip about 2.4 cm superior to the carina. Esophageal tube tip is below the diaphragm but incompletely visualized. Low lung volumes. Streaky right basilar opacity with patchy right suprahilar and left basilar airspace disease. No pneumothorax IMPRESSION: 1. Endotracheal tube tip about 2.4 cm superior to the carina. 2. Low lung volumes results in central bronchovascular crowding. Patchy airspace disease at the right suprahilar lung, and left greater than right base either reflecting atelectasis versus multifocal infection Electronically Signed   By: Donavan Foil M.D.   On: 07/24/2020 21:37   ECHOCARDIOGRAM COMPLETE  Result Date: 07/25/2020    ECHOCARDIOGRAM REPORT   Patient Name:   BELFORD PASCUCCI Date of Exam: 07/25/2020 Medical Rec #:  [id number]     Height:       73.0 in Accession #:    [id number]    Weight:       380.5 lb Date of Birth:  1981/04/15     BSA:          2.828 m Patient Age:    49 years      BP:           157/90 mmHg Patient Gender: M             HR:           75 bpm. Exam Location:  Inpatient Procedure: 2D Echo, Cardiac Doppler, Color Doppler and Intracardiac            Opacification Agent Indications:    Stroke  History:        Patient has prior history of Echocardiogram examinations, most                 recent 07/04/2020. Risk Factors:Hypertension, Diabetes and                 Dyslipidemia. Covid positive.  Sonographer:    Roseanna Rainbow RDCS Referring Phys: [place]  Sonographer Comments: Technically difficult study due to poor echo windows and patient is morbidly obese. Image acquisition challenging due to  patient body habitus. IMPRESSIONS  1. Left ventricular ejection fraction, by estimation, is 60 to 65%. The left ventricle has normal function. The left ventricle has no regional wall motion abnormalities. There is moderate concentric left ventricular hypertrophy. Left ventricular diastolic parameters are indeterminate.  2. Right ventricular systolic function is normal. The right ventricular size is normal.  3. The mitral valve is normal in structure. No evidence of mitral valve regurgitation. No evidence of mitral stenosis.  4. The aortic valve is tricuspid. Aortic valve regurgitation is not visualized. No aortic stenosis is present.  5. The inferior vena cava is dilated in size with >50% respiratory variability, suggesting right atrial pressure of 8 mmHg. Comparison(s): No significant change from prior study. Conclusion(s)/Recommendation(s): Normal biventricular function without evidence of hemodynamically significant valvular heart disease. No intracardiac source of embolism detected on this transthoracic study. A transesophageal echocardiogram is recommended to exclude cardiac source of embolism if clinically indicated. FINDINGS  Left Ventricle: Left ventricular ejection fraction, by estimation, is 60 to 65%. The left ventricle has normal function. The left ventricle has no regional wall motion abnormalities. Definity contrast agent was given IV to delineate the left ventricular  endocardial borders. The left ventricular internal cavity size was normal in size. There is moderate concentric left ventricular hypertrophy. Left ventricular diastolic parameters are indeterminate. Right Ventricle: The right ventricular size is normal. No increase in right ventricular wall thickness.  Right ventricular systolic function is normal. Left Atrium: Left atrial size was normal in size. Right Atrium: Right atrial size was normal in size. Pericardium: There is no evidence of pericardial effusion. Presence of pericardial fat pad.  Mitral Valve: The mitral valve is normal in structure. No evidence of mitral valve regurgitation. No evidence of mitral valve stenosis. Tricuspid Valve: The tricuspid valve is normal in structure. Tricuspid valve regurgitation is not demonstrated. No evidence of tricuspid stenosis. Aortic Valve: The aortic valve is tricuspid. Aortic valve regurgitation is not visualized. No aortic stenosis is present. Pulmonic Valve: The pulmonic valve was not well visualized. Pulmonic valve regurgitation is not visualized. Aorta: The aortic root, ascending aorta, aortic arch and descending aorta are all structurally normal, with no evidence of dilitation or obstruction. Venous: The inferior vena cava is dilated in size with greater than 50% respiratory variability, suggesting right atrial pressure of 8 mmHg. IAS/Shunts: The atrial septum is grossly normal.  LEFT VENTRICLE PLAX 2D LVIDd:         5.20 cm      Diastology LVIDs:         3.30 cm      LV e' medial:    5.33 cm/s LV PW:         1.50 cm      LV E/e' medial:  12.9 LV IVS:        1.50 cm      LV e' lateral:   6.97 cm/s LVOT diam:     2.70 cm      LV E/e' lateral: 9.9 LVOT Area:     5.73 cm  LV Volumes (MOD) LV vol d, MOD A2C: 99.4 ml LV vol d, MOD A4C: 208.0 ml LV vol s, MOD A2C: 42.0 ml LV vol s, MOD A4C: 89.8 ml LV SV MOD A2C:     57.4 ml LV SV MOD A4C:     208.0 ml LV SV MOD BP:      95.4 ml RIGHT VENTRICLE             IVC RV S prime:     17.20 cm/s  IVC diam: 2.20 cm TAPSE (M-mode): 1.2 cm LEFT ATRIUM           Index       RIGHT ATRIUM           Index LA diam:      3.20 cm 1.13 cm/m  RA Area:     11.80 cm LA Vol (A2C): 17.8 ml 6.29 ml/m  RA Volume:   23.20 ml  8.20 ml/m LA Vol (A4C): 49.9 ml 17.64 ml/m   AORTA Ao Root diam: 3.95 cm Ao Asc diam:  3.60 cm MITRAL VALVE MV Area (PHT): 2.60 cm    SHUNTS MV Decel Time: 292 msec    Systemic Diam: 2.70 cm MV E velocity: 68.90 cm/s MV A velocity: 60.70 cm/s MV E/A ratio:  1.14 Buford Dresser MD Electronically signed  by Buford Dresser MD Signature Date/Time: 07/25/2020/6:29:29 PM    Final    CT HEAD CODE STROKE WO CONTRAST  Result Date: 07/24/2020 CLINICAL DATA:  Code stroke.  Right arm weakness EXAM: CT HEAD WITHOUT CONTRAST TECHNIQUE: Contiguous axial images were obtained from the base of the skull through the vertex without intravenous contrast. COMPARISON:  None. FINDINGS: Brain: Small foci of hypodensity within the left caudate and left thalamus, may represent small infarcts, age indeterminate. No large acute territorial infarct, hemorrhage, hydrocephalus, extra-axial collection or mass lesion.  Vascular: No hyperdense vessel or unexpected calcification. Skull: Normal. Negative for fracture or focal lesion. Sinuses/Orbits: No acute finding. ASPECTS [facility] Stroke Program Early CT Score) - Ganglionic level infarction (caudate, lentiform nuclei, internal capsule, insula, M1-M3 cortex): 6 - Supraganglionic infarction (M4-M6 cortex): 3 Total score (0-10 with 10 being normal): 9 IMPRESSION: 1. Small foci of hypodensity within the left caudate and left thalamus, may represent small infarcts, age indeterminate. 2. Aspect score is 9. These results were called by telephone at the time of interpretation on 07/24/2020 at 3:57 pm to provider Dr. Leonel Ramsay, who verbally acknowledged these results. Electronically Signed   By: Pedro Earls M.D.   On: 07/24/2020 15:59   IR ANGIO VERTEBRAL SEL VERTEBRAL UNI R MOD SED  Result Date: 07/29/2020 INDICATION: New onset of aphasia. Abnormal CT angiogram of the head and neck. EXAM: 1. EMERGENT LARGE VESSEL OCCLUSION THROMBOLYSIS (anterior CIRCULATION) COMPARISON: CT angiogram of the head and neck of July 24, 2020. MEDICATIONS: Ancef 2 g IV antibiotic was administered within 1 hour of the procedure. ANESTHESIA/SEDATION: General anesthesia CONTRAST: Isovue 300 90 mL FLUOROSCOPY TIME: Fluoroscopy Time: 15 minutes 30 seconds (2239 mGy). COMPLICATIONS: None immediate.  TECHNIQUE: Following a full explanation of the procedure along with the potential associated complications, an informed witnessed consent was obtained from the father. The risks of intracranial hemorrhage of 10%, worsening neurological deficit, ventilator dependency, death and inability to revascularize were all reviewed in detail with the patient's father. The patient was then put under general anesthesia by the Department of Anesthesiology at [facility]. The right groin was prepped and draped in the usual sterile fashion. Thereafter using modified Seldinger technique, transfemoral access into the right common femoral artery was obtained without difficulty. Over a 0.035 inch guidewire an 8 French 25 cm Pinnacle sheath was inserted. Through this, and also over a 0.035 inch guidewire a 5 [place] JB 1 catheter was advanced to the aortic arch region and selectively positioned in the right common carotid artery, the left common carotid artery, the right vertebral artery and the left subclavian artery. FINDINGS: The right common carotid arteriogram demonstrates the right external carotid artery and its major branches to be widely patent. The right internal carotid artery at the bulb to the cranial skull base is widely patent. The petrous, the cavernous and the supraclinoid segments are widely patent. A there is severe tapered stenosis of the right middle cerebral artery proximal to mid M1 segment with flow noted into the distal trifurcation branches in a delayed manner compared to the right anterior cerebral artery hemodynamically. The right anterior cerebral artery opacifies into the capillary and venous phases. Prompt cross-filling via the anterior communicating artery of the left anterior cerebral artery A2 segment and distally is noted. The left common carotid arteriogram demonstrates the left external carotid artery and its major branches to be widely patent. The left internal carotid artery at the bulb to  the cranial skull base demonstrates wide patency. Prominent left posterior communicating artery is seen opacifying the left posterior cerebral distribution. Distal to this there is a severe tapered stenosis of the left internal carotid supraclinoid segment leading to the origin of the left anterior cerebral artery A1 segment with the distal anterior cerebral artery distribution being widely patent. Non opacification of the left middle cerebral artery M1 segment in its proximal segment is noted. However, there is a delayed retrograde opacification of the left MCA trifurcation branches and subsequently the distal half of the left middle cerebral M1 segment  from retrograde collaterals arising from the anterior cerebral artery, and the leptomeningeal branches of the P3 P4 segments of the left posterior cerebral artery and also the anterior and posterior temporal branches of the left posterior cerebral artery P1 P2 segment. The left subclavian arteriogram demonstrates hypoplastic left vertebral artery origin to be widely patent. The vessel is seen to opacify to the cranial skull base supplying the left vertebrobasilar junction and the left posterior-inferior cerebellar artery. More distally opacification is seen partially of the basilar artery with inflow of unopacified blood from the more dominant right vertebral artery. The right vertebral artery origin is widely patent. The vessel opacifies to the cranial skull base. Patency is seen of the right vertebrobasilar junction and the right posterior-inferior cerebellar artery. The basilar artery, the right posterior cerebral artery, the superior cerebellar arteries and the anterior-inferior cerebellar arteries opacify into the capillary and venous phases. Distal branches from the right posterior cerebral artery P3 P4 segment retrogradely opacified partially the right posterior parietooccipital region. PROCEDURE: Diagnostic JB 1 catheter in the left internal carotid artery  was exchanged over a 0.035 inch 300 cm Rosen exchange guidewire for an 087 balloon guide which had been prepped with 50% contrast and 50% heparinized saline infusion. This was positioned right at the origin of the left internal carotid artery. In a coaxial manner and with constant heparinized saline infusion using biplane roadmap technique, a combination of an 014 inch Synchro standard micro guidewire with a J configuration with an 021 Headway microcatheter was advanced within a 132 cm 6 [place] Catalyst guide catheter advanced to the proximal cavernous segment of the left internal carotid artery. Using a torque device, the micro guidewire was gently manipulated to advance into near occlusive supraclinoid left ICA. The wire was then gently maneuvered into the proximal left middle cerebral artery where significant resistance was noted to its advancement. With gentle torquing, the wire suddenly gave way and advanced into the proximal inferior division left MCA. The microcatheter was then gently advanced into the supraclinoid left ICA with modest resistance. However, it could not be advanced into the proximal left middle cerebral artery again meeting significant resistance despite multiple maneuvers such as advancing the micro guidewire more distally in the inferior division. This suggested chronic nature to the occluded left middle cerebral artery and the supraclinoid left ICA. The micro guidewire and microcatheter were withdrawn. Control arteriograms were then performed from the Catalyst 6 [place] guide catheter in the left internal carotid artery which continued to demonstrate no change in the left posterior cerebral artery and the left anterior cerebral artery, with retrograde opacification of the left middle cerebral artery distal M1 region. No further attempts were made in regards to revascularization of the left middle cerebral artery given the chronic nature of the occlusion. The 6 [place] Catalyst guide catheter,  and the balloon guide were retrieved and removed. The 8 French Pinnacle sheath was removed with successful hemostasis in the right groin with an 8 French Angio-Seal closure device. The right groin appeared soft. Distal pulses remained Dopplerable in both feet. The patient was left intubated on account of the patient's positive COVID status and return to the PACU and then neuro ICU for further management. IMPRESSION: Status post attempted revascularization of occluded left middle cerebral artery proximally, and of the supraclinoid left ICA as described above. 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 PLAN: As per referring MD. Electronically Signed By: Luanne Bras M.D. On: 07/25/2020 13:59    Subjective:  No acute issues or events overnight   Discharge Exam: Vitals:   08/09/20 0000 08/09/20 0425  BP: (!) 104/57 111/71  Pulse: 63 67  Resp: 19 18  Temp: 97.8 F (36.6 C) 97.7 F (36.5 C)  SpO2: 98% 98%   Vitals:   08/08/20 1554 08/08/20 2059 08/09/20 0000 08/09/20 0425  BP: 97/63 111/63 (!) 104/57 111/71  Pulse: 73 68 63 67  Resp: '17 18 19 18  ' Temp: 98.3 F (36.8 C) 97.8 F (36.6 C) 97.8 F (36.6 C) 97.7 F (36.5 C)  TempSrc:  Oral Oral   SpO2: 97% 95% 98% 98%  Weight:        General: Pt is alert, awake, not in acute distress, nonverbal but interactive Cardiovascular: RRR, S1/S2 +, no rubs, no gallops Respiratory: CTA bilaterally, no wheezing, no rhonchi Abdominal: Soft, NT, ND, bowel sounds + Extremities: no edema, no cyanosis, profound right-sided deficits    The results of significant diagnostics from this hospitalization (including imaging, microbiology, ancillary and laboratory) are listed below for reference.     Microbiology: No results found for this or any previous visit (from the past 240 hour(s)).   Labs: BNP (last 3 results) No results for input(s): BNP in the last 8760 hours. Basic Metabolic Panel: Recent Labs   Lab 08/08/20 0053  K 4.0  MG 1.9   Liver Function Tests: No results for input(s): AST, ALT, ALKPHOS, BILITOT, PROT, ALBUMIN in the last 168 hours. No results for input(s): LIPASE, AMYLASE in the last 168 hours. No results for input(s): AMMONIA in the last 168 hours. CBC: No results for input(s): WBC, NEUTROABS, HGB, HCT, MCV, PLT in the last 168 hours. Cardiac Enzymes: No results for input(s): CKTOTAL, CKMB, CKMBINDEX, TROPONINI in the last 168 hours. BNP: Invalid input(s): POCBNP CBG: Recent Labs  Lab 08/08/20 0833 08/08/20 1145 08/08/20 1550 08/08/20 2126 08/09/20 0612  GLUCAP 175* 138* 137* 161* 167*   D-Dimer No results for input(s): DDIMER in the last 72 hours. Hgb A1c No results for input(s): HGBA1C in the last 72 hours. Lipid Profile No results for input(s): CHOL, HDL, LDLCALC, TRIG, CHOLHDL, LDLDIRECT in the last 72 hours. Thyroid function studies No results for input(s): TSH, T4TOTAL, T3FREE, THYROIDAB in the last 72 hours.  Invalid input(s): FREET3 Anemia work up No results for input(s): VITAMINB12, FOLATE, FERRITIN, TIBC, IRON, RETICCTPCT in the last 72 hours. Urinalysis    Component Value Date/Time   COLORURINE YELLOW 01/05/2015 0642   APPEARANCEUR CLEAR 01/05/2015 0642   LABSPEC 1.016 01/05/2015 0642   PHURINE 5.5 01/05/2015 0642   GLUCOSEU NEGATIVE 01/05/2015 0642   HGBUR SMALL (A) 01/05/2015 0642   BILIRUBINUR NEGATIVE 01/05/2015 0642   KETONESUR NEGATIVE 01/05/2015 0642   PROTEINUR NEGATIVE 01/05/2015 0642   UROBILINOGEN 0.2 01/05/2015 0642   NITRITE NEGATIVE 01/05/2015 0642   LEUKOCYTESUR TRACE (A) 01/05/2015 0642   Sepsis Labs Invalid input(s): PROCALCITONIN,  WBC,  LACTICIDVEN Microbiology No results found for this or any previous visit (from the past 240 hour(s)).   Time coordinating discharge: Over 30 minutes  SIGNED:   [name], DO [facility] 08/09/2020, 7:17 AM Pager   If 7PM-7AM, please contact  night-coverage [email or web address]

## 2020-08-08 NOTE — Progress Notes (Signed)
Physical Therapy Treatment Patient Details Name: Justin Mason MRN: [id number] DOB: Jan 20, 1981 Today's Date: 08/08/2020    History of Present Illness 40 yo male with PMH of morbid obesity, DM2, HLD, HTN, known COVID-19 infection (dx less than 1 week PTA), history of shoulder surgery with residual R shoulder who presented to ED 1/19 with aphasia and RUE weakness beginning at about 1030 AM 07/24/2020 and progressing until 1230 same day with R facial droop. 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  Outside of window for tpa. Went to IR for revascularization but angioplasty of MCA unsuccessful due to resistance.    PT Comments    Pt continues to make progress and is highly motivated. He displayed good carryover with rolling to the L from previous session, only needing modA for all bed mob this date. He also only needed modAx1 to come to stand from EOB > stedy and minAx1 to come to stand in the stedy. Encouraged neuromuscular stimulation of legs and R UE through providing tactile, verbal, and visual cues using a mirror to shift his weight laterally between legs and lift his contralateral leg with physical assistance to prepare for gait rhythm with R UE was weight bearing on bar of stedy. Noted possible R quad muscle activation this date. Will continue to follow acutely. Current recommendations remain appropriate.  Follow Up Recommendations  CIR;Supervision/Assistance - 24 hour     Equipment Recommendations  Other (comment) (TBD)    Recommendations for Other Services       Precautions / Restrictions Precautions Precautions: Fall Precaution Comments: R inattention, R side flaccid Restrictions Weight Bearing Restrictions: No    Mobility  Bed Mobility Overal bed mobility: Needs Assistance Bed Mobility: Rolling;Sidelying to Sit Rolling: Mod assist Sidelying to sit: Mod assist       General bed  mobility comments: Pt able to roll to the L with use of bed rails and AAROM at R arm and leg and trunk to gain momentum and bring limbs across body with modA. Cues to bring knees to chest with modA and to transition onto L shoulder > elbow > hand to ascend trunk with some assistance.  Transfers Overall transfer level: Needs assistance Equipment used: Ambulation equipment used Transfers: Sit to/from [employer] Sit to Stand: Mod assist Stand pivot transfers: Total assist (sara stedy)       General transfer comment: pt performed sit>stand 1x from bed to stedy with modA and 3x from stedy with minA and in front of mirror working on maintaining midline on the way up. Tactile cues at R quad to extend.  Ambulation/Gait             General Gait Details: pregait training x3 bouts of ~2-3 min each in stedy with wt shifting and lifting each leg with tactile cues at block at R knee and gluts, min guard when standign midline and up to min-modA when trying to lift and clear feet. Possible R quad muscle activation noted this date.   Stairs             Wheelchair Mobility    Modified Rankin (Stroke Patients Only) Modified Rankin (Stroke Patients Only) Pre-Morbid Rankin Score: No symptoms Modified Rankin: Severe disability     Balance Overall balance assessment: Needs assistance Sitting-balance support: Feet supported;No upper extremity supported Sitting balance-Leahy Scale: Good Sitting balance - Comments: pt able to maintain sitting without  UE support with supervision.   Standing balance support: Bilateral upper extremity supported;During functional activity Standing balance-Leahy Scale: Poor Standing balance comment: heavy reliance on LUE support and external support. Placing weight through R hand on stedy during standing bouts.                            Cognition Arousal/Alertness: Awake/alert Behavior During Therapy: WFL for tasks  assessed/performed Overall Cognitive Status: Difficult to assess Area of Impairment: Awareness;Problem solving;Safety/judgement                         Safety/Judgement: Decreased awareness of safety;Decreased awareness of deficits Awareness: Emergent Problem Solving: Difficulty sequencing;Requires verbal cues;Requires tactile cues General Comments: pt following all commands and nodding appropriately during session, however pt making no effort to verbalize during session      Exercises Other Exercises Other Exercises: Modified sit-ups sitting in stedy without UE support, cuing pt to maintain midline with use of mirror leaning as far anterior <> posterior ~10x Other Exercises: Bil lateral trunk flexion without UE support ~6x each direction, sitting on stedy    General Comments General comments (skin integrity, edema, etc.): HEP verbalized as seated marching and LAQ/heel slides on ground      Pertinent Vitals/Pain Pain Assessment: No/denies pain Pain Intervention(s): Monitored during session    Home Living                      Prior Function            PT Goals (current goals can now be found in the care plan section) Acute Rehab PT Goals Patient Stated Goal: get pt as independent as possible PT Goal Formulation: With patient Time For Goal Achievement: 08/22/20 Potential to Achieve Goals: Good Progress towards PT goals: Progressing toward goals    Frequency    Min 4X/week      PT Plan Current plan remains appropriate    Co-evaluation              AM-PAC PT "6 Clicks" Mobility   Outcome Measure  Help needed turning from your back to your side while in a flat bed without using bedrails?: A Lot Help needed moving from lying on your back to sitting on the side of a flat bed without using bedrails?: A Lot Help needed moving to and from a bed to a chair (including a wheelchair)?: Total Help needed standing up from a chair using your arms (e.g.,  wheelchair or bedside chair)?: A Lot Help needed to walk in hospital room?: Total Help needed climbing 3-5 steps with a railing? : Total 6 Click Score: 9    End of Session Equipment Utilized During Treatment: Gait belt Activity Tolerance: Patient tolerated treatment well Patient left: in chair;with call bell/phone within reach;with chair alarm set   PT Visit Diagnosis: Hemiplegia and hemiparesis;Other abnormalities of gait and mobility (R26.89);Unsteadiness on feet (R26.81);Muscle weakness (generalized) (M62.81);Difficulty in walking, not elsewhere classified (R26.2);Other symptoms and signs involving the nervous system (R29.898) Hemiplegia - Right/Left: Right Hemiplegia - dominant/non-dominant: Dominant Hemiplegia - caused by: Cerebral infarction     Time: 0918-1000 PT Time Calculation (min) (ACUTE ONLY): 42 min  Charges:  $Therapeutic Activity: 8-22 mins $Neuromuscular Re-education: 23-37 mins                     Raymond Gurney, PT, DPT Acute Rehabilitation Services  Pager: [phone] Office:  [phone]    Henrene Dodge Pettis 08/08/2020, 3:48 PM

## 2020-08-08 NOTE — Progress Notes (Signed)
Inpatient Rehabilitation-Admissions Coordinator   I do not have a bed available in CIR today for this patient. Will follow up tomorrow for possible admit, pending bed availability.   Cheri Rous, OTR/L  Rehab Admissions Coordinator  [phone] 08/08/2020 11:03 AM

## 2020-08-08 NOTE — Plan of Care (Signed)

## 2020-08-08 NOTE — Progress Notes (Signed)
PROGRESS NOTE    Justin Mason  QQP:[id number] DOB: 26-Oct-1980 DOA: 07/24/2020 PCP: Eber Jones, NP   Brief Narrative:   40 yo M PMH morbid obesity, DM2, HLD, HTN, known COVID-19 infection (dx less than 1 week PTA) who presented to ED 1/19 with aphasia and RUE weakness beginning at about 1030 AM 07/24/2020 and progressing until 1230 same day with R facial droop. CT H with concern for L sided infarcts CTA with short segment occlusion of R M1/MCA, L ICA occlusion, proximal L M1, stenosis of PCAs. Outside of window for tpa. Went to [facility]for revascularization but angioplasty of MCA unsuccessful due to resistance. Patient continues to progress daily with speech and PT - on covid precautions through 08/02/20 which limits disposition. Off covid precautions (as of 08/03/20) awaiting placement. CIR currently awaiting open bed, patient remains medically stable for discharge once a bed has become available, insurance has been approved at this point.  Assessment & Plan:  L ICA occlusion, severe R MCA stenosis - Presenting with right side weakness and aphasia - Unsuccessful revascularization due to chronicity - Cause of decompensation of chronic disease unclear - possible hypoglycemic episode.  - Neuro eval'd: DAPT for 3 months then transition to single agent - Continue statin - SLP following - diet improving - back to regular/thins currently - 1/25-2/1: Continuing to work with rehab services; still working on placement (limited due to previous covid/quarantine precautions).  Incidental covid positive - Acute respiratory failure post procedure - Hypoxia due to procedure; resolved - No current indication for COVID treatment   - Off quarantine 08/03/20 - 1/25: Stable on RA  Hypertension.  - Completed permissive HTN window - HCTZ, irbesartan, clonidine, metoprolol - BP stable - continue to follow  Hypokalemia - Resolved - no longer following given improved PO intake  DVT prophylaxis:  Heparin Code Status: FULL Family Communication: Mother updated at length about prognosis/disposition Status is: Inpatient  Remains inpatient appropriate because:Inpatient level of care appropriate due to severity of illness  Dispo: The patient is from: Home             Anticipated d/c is to: SNF vs CIR             Anticipated d/c date is: 24-48h             Patient currently is medically stable to d/c.  Difficult to place patient No  Consultants:   Neurology  Subjective: No acute events overnight -still nonverbal, able to sit up in chair currently playing games and watching television, review of systems limited but patient denies any acute issues or concerns..  Objective: Vitals:   08/08/20 0406 08/08/20 0837 08/08/20 0904 08/08/20 1153  BP: 99/78 (!) 142/79 (!) 147/70 (!) 91/58  Pulse: (!) 54 68 69 75  Resp:  17  18  Temp:  (!) 97.5 F (36.4 C)  98.1 F (36.7 C)  TempSrc:  Oral  Oral  SpO2:  98%  100%  Weight: (!) 159.1 kg       Intake/Output Summary (Last 24 hours) at 08/08/2020 1405 Last data filed at 08/08/2020 0900 Gross per 24 hour  Intake 150 ml  Output 550 ml  Net -400 ml   Filed Weights   07/25/20 0604 07/26/20 1540 08/08/20 0406  Weight: (!) 172.6 kg (!) 166.3 kg (!) 159.1 kg   Examination:  General: 40 y.o. male resting in bed in NAD Eyes: PERRL, normal sclera ENMT: Nares patent w/o discharge, orophaynx clear, dentition normal, ears w/o  discharge/lesions/ulcers Neck: Supple, trachea midline Cardiovascular: RRR, +S1, S2, no m/g/r, equal pulses throughout Respiratory: CTABL, no w/r/r, normal WOB GI: BS+, NDNT, no masses noted, no organomegaly noted Neuro: Alert, following commands, RUE/RLE profound weakness/diminished sensation  Data Reviewed: I have personally reviewed following labs and imaging studies  CBC: No results for input(s): WBC, NEUTROABS, HGB, HCT, MCV, PLT in the last 168 hours. Basic Metabolic Panel: Recent Labs  Lab 08/08/20 0053  K  4.0  MG 1.9   GFR: Estimated Creatinine Clearance: 163.1 mL/min (by C-G formula based on SCr of 0.96 mg/dL).   Liver Function Tests: No results for input(s): AST, ALT, ALKPHOS, BILITOT, PROT, ALBUMIN in the last 168 hours. No results for input(s): LIPASE, AMYLASE in the last 168 hours. No results for input(s): AMMONIA in the last 168 hours.  Coagulation Profile: No results for input(s): INR, PROTIME in the last 168 hours. Cardiac Enzymes: No results for input(s): CKTOTAL, CKMB, CKMBINDEX, TROPONINI in the last 168 hours.  BNP (last 3 results) No results for input(s): PROBNP in the last 8760 hours.  HbA1C: No results for input(s): HGBA1C in the last 72 hours.  CBG: Recent Labs  Lab 08/07/20 1636 08/07/20 2124 08/08/20 0620 08/08/20 0833 08/08/20 1145  GLUCAP 169* 146* 147* 175* 138*   Lipid Profile: No results for input(s): CHOL, HDL, LDLCALC, TRIG, CHOLHDL, LDLDIRECT in the last 72 hours.  Thyroid Function Tests: No results for input(s): TSH, T4TOTAL, FREET4, T3FREE, THYROIDAB in the last 72 hours.  Anemia Panel: No results for input(s): VITAMINB12, FOLATE, FERRITIN, TIBC, IRON, RETICCTPCT in the last 72 hours.  Sepsis Labs: No results for input(s): PROCALCITON, LATICACIDVEN in the last 168 hours. No results found for this or any previous visit (from the past 240 hour(s)).  Radiology Studies: No results found.  Scheduled Meds: . vitamin C  500 mg Oral Daily  . aspirin  300 mg Rectal Daily   Or  . aspirin  325 mg Oral Daily  . cholecalciferol  1,000 Units Oral QPM  . cloNIDine  0.2 mg Oral BID  . feeding supplement  237 mL Oral BID BM  . heparin injection (subcutaneous)  7,500 Units Subcutaneous Q8H  . hydrochlorothiazide  25 mg Oral Daily  . insulin aspart  0-15 Units Subcutaneous TID AC & HS  . irbesartan  300 mg Oral Daily  . mouth rinse  15 mL Mouth Rinse BID  . meloxicam  15 mg Oral Daily  . metFORMIN  1,000 mg Oral BID WC  . metoprolol tartrate  100  mg Oral BID  . multivitamin with minerals  1 tablet Oral Daily  . pantoprazole sodium  40 mg Oral Daily  . potassium chloride  40 mEq Oral Daily  . rosuvastatin  10 mg Oral Daily  . sertraline  25 mg Oral Daily  . zinc sulfate  220 mg Oral QPM    LOS: 15 days   Time spent: 25 minutes  Azucena Fallen, DO [facility]  If 7PM-7AM, please contact night-coverage [email or web address] 08/08/2020, 2:05 PM

## 2020-08-09 ENCOUNTER — Encounter (HOSPITAL_COMMUNITY): Payer: Self-pay | Admitting: Physical Medicine & Rehabilitation

## 2020-08-09 ENCOUNTER — Encounter (HOSPITAL_COMMUNITY): Payer: Self-pay | Admitting: Neurology

## 2020-08-09 ENCOUNTER — Inpatient Hospital Stay (HOSPITAL_COMMUNITY)
Admission: RE | Admit: 2020-08-09 | Discharge: 2020-09-06 | DRG: 057 | Disposition: A | Discharge status: Home Health | Payer: Managed Care, Other (non HMO) | Source: Intra-hospital | Attending: Physical Medicine & Rehabilitation | Admitting: Physical Medicine & Rehabilitation

## 2020-08-09 ENCOUNTER — Other Ambulatory Visit: Payer: Self-pay

## 2020-08-09 DIAGNOSIS — G8929 Other chronic pain: Secondary | ICD-10-CM | POA: Diagnosis present

## 2020-08-09 DIAGNOSIS — I69391 Dysphagia following cerebral infarction: Secondary | ICD-10-CM | POA: Diagnosis not present

## 2020-08-09 DIAGNOSIS — M25521 Pain in right elbow: Secondary | ICD-10-CM | POA: Diagnosis present

## 2020-08-09 DIAGNOSIS — M25562 Pain in left knee: Secondary | ICD-10-CM

## 2020-08-09 DIAGNOSIS — Z720 Tobacco use: Secondary | ICD-10-CM

## 2020-08-09 DIAGNOSIS — I69351 Hemiplegia and hemiparesis following cerebral infarction affecting right dominant side: Principal | ICD-10-CM

## 2020-08-09 DIAGNOSIS — M7989 Other specified soft tissue disorders: Secondary | ICD-10-CM | POA: Diagnosis not present

## 2020-08-09 DIAGNOSIS — R0989 Other specified symptoms and signs involving the circulatory and respiratory systems: Secondary | ICD-10-CM

## 2020-08-09 DIAGNOSIS — N390 Urinary tract infection, site not specified: Secondary | ICD-10-CM | POA: Diagnosis not present

## 2020-08-09 DIAGNOSIS — G8191 Hemiplegia, unspecified affecting right dominant side: Secondary | ICD-10-CM

## 2020-08-09 DIAGNOSIS — I10 Essential (primary) hypertension: Secondary | ICD-10-CM | POA: Diagnosis present

## 2020-08-09 DIAGNOSIS — I69322 Dysarthria following cerebral infarction: Secondary | ICD-10-CM | POA: Diagnosis not present

## 2020-08-09 DIAGNOSIS — I69320 Aphasia following cerebral infarction: Secondary | ICD-10-CM

## 2020-08-09 DIAGNOSIS — Z8616 Personal history of COVID-19: Secondary | ICD-10-CM

## 2020-08-09 DIAGNOSIS — E785 Hyperlipidemia, unspecified: Secondary | ICD-10-CM | POA: Diagnosis present

## 2020-08-09 DIAGNOSIS — L89326 Pressure-induced deep tissue damage of left buttock: Secondary | ICD-10-CM | POA: Diagnosis present

## 2020-08-09 DIAGNOSIS — E1165 Type 2 diabetes mellitus with hyperglycemia: Secondary | ICD-10-CM | POA: Diagnosis present

## 2020-08-09 DIAGNOSIS — Z7984 Long term (current) use of oral hypoglycemic drugs: Secondary | ICD-10-CM

## 2020-08-09 DIAGNOSIS — I69392 Facial weakness following cerebral infarction: Secondary | ICD-10-CM

## 2020-08-09 DIAGNOSIS — I69312 Visuospatial deficit and spatial neglect following cerebral infarction: Secondary | ICD-10-CM

## 2020-08-09 DIAGNOSIS — E119 Type 2 diabetes mellitus without complications: Secondary | ICD-10-CM

## 2020-08-09 DIAGNOSIS — E871 Hypo-osmolality and hyponatremia: Secondary | ICD-10-CM | POA: Diagnosis present

## 2020-08-09 DIAGNOSIS — M109 Gout, unspecified: Secondary | ICD-10-CM

## 2020-08-09 DIAGNOSIS — D62 Acute posthemorrhagic anemia: Secondary | ICD-10-CM | POA: Diagnosis present

## 2020-08-09 DIAGNOSIS — M1712 Unilateral primary osteoarthritis, left knee: Secondary | ICD-10-CM | POA: Diagnosis present

## 2020-08-09 DIAGNOSIS — K219 Gastro-esophageal reflux disease without esophagitis: Secondary | ICD-10-CM | POA: Diagnosis present

## 2020-08-09 DIAGNOSIS — I69390 Apraxia following cerebral infarction: Secondary | ICD-10-CM | POA: Diagnosis not present

## 2020-08-09 DIAGNOSIS — Z6841 Body Mass Index (BMI) 40.0 and over, adult: Secondary | ICD-10-CM | POA: Diagnosis not present

## 2020-08-09 DIAGNOSIS — E11649 Type 2 diabetes mellitus with hypoglycemia without coma: Secondary | ICD-10-CM | POA: Diagnosis not present

## 2020-08-09 DIAGNOSIS — R4701 Aphasia: Secondary | ICD-10-CM | POA: Diagnosis not present

## 2020-08-09 DIAGNOSIS — Z79899 Other long term (current) drug therapy: Secondary | ICD-10-CM

## 2020-08-09 DIAGNOSIS — R1312 Dysphagia, oropharyngeal phase: Secondary | ICD-10-CM | POA: Diagnosis present

## 2020-08-09 DIAGNOSIS — B9620 Unspecified Escherichia coli [E. coli] as the cause of diseases classified elsewhere: Secondary | ICD-10-CM | POA: Diagnosis not present

## 2020-08-09 DIAGNOSIS — R52 Pain, unspecified: Secondary | ICD-10-CM

## 2020-08-09 DIAGNOSIS — R7401 Elevation of levels of liver transaminase levels: Secondary | ICD-10-CM | POA: Diagnosis present

## 2020-08-09 DIAGNOSIS — G4733 Obstructive sleep apnea (adult) (pediatric): Secondary | ICD-10-CM | POA: Diagnosis present

## 2020-08-09 DIAGNOSIS — M1A9XX0 Chronic gout, unspecified, without tophus (tophi): Secondary | ICD-10-CM | POA: Diagnosis present

## 2020-08-09 DIAGNOSIS — I639 Cerebral infarction, unspecified: Secondary | ICD-10-CM

## 2020-08-09 DIAGNOSIS — N179 Acute kidney failure, unspecified: Secondary | ICD-10-CM | POA: Diagnosis present

## 2020-08-09 DIAGNOSIS — L89316 Pressure-induced deep tissue damage of right buttock: Secondary | ICD-10-CM | POA: Diagnosis present

## 2020-08-09 DIAGNOSIS — Z8679 Personal history of other diseases of the circulatory system: Secondary | ICD-10-CM

## 2020-08-09 DIAGNOSIS — L8990 Pressure ulcer of unspecified site, unspecified stage: Secondary | ICD-10-CM | POA: Insufficient documentation

## 2020-08-09 DIAGNOSIS — L89152 Pressure ulcer of sacral region, stage 2: Secondary | ICD-10-CM | POA: Diagnosis present

## 2020-08-09 DIAGNOSIS — E1169 Type 2 diabetes mellitus with other specified complication: Secondary | ICD-10-CM | POA: Diagnosis not present

## 2020-08-09 DIAGNOSIS — Z713 Dietary counseling and surveillance: Secondary | ICD-10-CM

## 2020-08-09 HISTORY — DX: Acute kidney failure, unspecified: N17.9

## 2020-08-09 HISTORY — DX: Cerebral infarction, unspecified: I63.9

## 2020-08-09 LAB — GLUCOSE, CAPILLARY
Glucose-Capillary: 167 mg/dL — ABNORMAL HIGH (ref 70–99)
Glucose-Capillary: 173 mg/dL — ABNORMAL HIGH (ref 70–99)
Glucose-Capillary: 146 mg/dL — ABNORMAL HIGH (ref 70–99)
Glucose-Capillary: 137 mg/dL — ABNORMAL HIGH (ref 70–99)

## 2020-08-09 MED ORDER — DIPHENHYDRAMINE HCL 12.5 MG/5ML PO ELIX: 12.5000 mg | ORAL_SOLUTION | Freq: Four times a day (QID) | ORAL | Status: DC | PRN

## 2020-08-09 MED ORDER — IRBESARTAN 300 MG PO TABS
300.0000 mg | ORAL_TABLET | Freq: Every day | ORAL | Status: DC
  Administered 2020-08-10 – 2020-08-12 (×3): 300 mg via ORAL
  Filled 2020-08-09 (×3): qty 1

## 2020-08-09 MED ORDER — METOPROLOL TARTRATE 50 MG PO TABS
100.0000 mg | ORAL_TABLET | Freq: Two times a day (BID) | ORAL | Status: DC
  Administered 2020-08-09 – 2020-09-06 (×56): 100 mg via ORAL
  Filled 2020-08-09 (×57): qty 2

## 2020-08-09 MED ORDER — CLOPIDOGREL BISULFATE 75 MG PO TABS
75.0000 mg | ORAL_TABLET | Freq: Every day | ORAL | Status: DC
  Administered 2020-08-09 – 2020-09-06 (×29): 75 mg via ORAL
  Filled 2020-08-09 (×29): qty 1

## 2020-08-09 MED ORDER — ROSUVASTATIN CALCIUM 5 MG PO TABS
10.0000 mg | ORAL_TABLET | Freq: Every day | ORAL | Status: DC
  Administered 2020-08-10 – 2020-09-06 (×28): 10 mg via ORAL
  Filled 2020-08-09 (×28): qty 2

## 2020-08-09 MED ORDER — INSULIN ASPART 100 UNIT/ML ~~LOC~~ SOLN
0.0000 [IU] | Freq: Three times a day (TID) | SUBCUTANEOUS | Status: DC
  Administered 2020-08-09: 2 [IU] via SUBCUTANEOUS
  Administered 2020-08-10 – 2020-08-11 (×3): 3 [IU] via SUBCUTANEOUS
  Administered 2020-08-11: 2 [IU] via SUBCUTANEOUS
  Administered 2020-08-11 – 2020-08-12 (×4): 3 [IU] via SUBCUTANEOUS
  Administered 2020-08-13 (×2): 2 [IU] via SUBCUTANEOUS
  Administered 2020-08-13: 3 [IU] via SUBCUTANEOUS
  Administered 2020-08-14: 2 [IU] via SUBCUTANEOUS
  Administered 2020-08-14: 3 [IU] via SUBCUTANEOUS
  Administered 2020-08-14: 2 [IU] via SUBCUTANEOUS
  Administered 2020-08-15: 3 [IU] via SUBCUTANEOUS
  Administered 2020-08-15 (×2): 2 [IU] via SUBCUTANEOUS
  Administered 2020-08-16: 5 [IU] via SUBCUTANEOUS
  Administered 2020-08-16 – 2020-08-17 (×4): 3 [IU] via SUBCUTANEOUS
  Administered 2020-08-17: 2 [IU] via SUBCUTANEOUS
  Administered 2020-08-18 – 2020-08-20 (×7): 3 [IU] via SUBCUTANEOUS
  Administered 2020-08-21: 2 [IU] via SUBCUTANEOUS
  Administered 2020-08-21: 3 [IU] via SUBCUTANEOUS
  Administered 2020-08-22: 2 [IU] via SUBCUTANEOUS
  Administered 2020-08-22: 3 [IU] via SUBCUTANEOUS
  Administered 2020-08-23: 2 [IU] via SUBCUTANEOUS
  Administered 2020-08-24: 5 [IU] via SUBCUTANEOUS
  Administered 2020-08-24: 3 [IU] via SUBCUTANEOUS
  Administered 2020-08-24 – 2020-08-25 (×2): 2 [IU] via SUBCUTANEOUS
  Administered 2020-08-25: 3 [IU] via SUBCUTANEOUS
  Administered 2020-08-25 – 2020-08-26 (×2): 2 [IU] via SUBCUTANEOUS
  Administered 2020-08-26: 3 [IU] via SUBCUTANEOUS
  Administered 2020-08-27 – 2020-08-28 (×2): 2 [IU] via SUBCUTANEOUS
  Administered 2020-08-29: 3 [IU] via SUBCUTANEOUS
  Administered 2020-08-29 – 2020-09-01 (×6): 2 [IU] via SUBCUTANEOUS
  Administered 2020-09-01: 3 [IU] via SUBCUTANEOUS
  Administered 2020-09-02 – 2020-09-04 (×5): 2 [IU] via SUBCUTANEOUS

## 2020-08-09 MED ORDER — SERTRALINE HCL 25 MG PO TABS: 25.0000 mg | ORAL_TABLET | Freq: Every day | ORAL | 0 refills | Status: DC

## 2020-08-09 MED ORDER — INSULIN ASPART 100 UNIT/ML ~~LOC~~ SOLN
0.0000 [IU] | Freq: Every day | SUBCUTANEOUS | Status: DC
  Administered 2020-08-17: 2 [IU] via SUBCUTANEOUS

## 2020-08-09 MED ORDER — ORAL CARE MOUTH RINSE
15.0000 mL | Freq: Two times a day (BID) | OROMUCOSAL | Status: DC
  Administered 2020-08-09 – 2020-09-06 (×45): 15 mL via OROMUCOSAL

## 2020-08-09 MED ORDER — HYDROCHLOROTHIAZIDE 25 MG PO TABS
25.0000 mg | ORAL_TABLET | Freq: Every day | ORAL | Status: DC
  Administered 2020-08-10 – 2020-08-11 (×2): 25 mg via ORAL
  Filled 2020-08-09 (×2): qty 1

## 2020-08-09 MED ORDER — ADULT MULTIVITAMIN W/MINERALS CH
1.0000 | ORAL_TABLET | Freq: Every day | ORAL | Status: DC
  Administered 2020-08-10 – 2020-09-06 (×28): 1 via ORAL
  Filled 2020-08-09 (×28): qty 1

## 2020-08-09 MED ORDER — SERTRALINE HCL 50 MG PO TABS
25.0000 mg | ORAL_TABLET | Freq: Every day | ORAL | Status: DC
  Administered 2020-08-10 – 2020-09-06 (×28): 25 mg via ORAL
  Filled 2020-08-09 (×28): qty 1

## 2020-08-09 MED ORDER — GERHARDT'S BUTT CREAM
TOPICAL_CREAM | Freq: Three times a day (TID) | CUTANEOUS | Status: DC
  Administered 2020-08-11 – 2020-08-26 (×3): 1 via TOPICAL
  Filled 2020-08-09 (×4): qty 1

## 2020-08-09 MED ORDER — VITAMIN D 25 MCG (1000 UNIT) PO TABS
1000.0000 [IU] | ORAL_TABLET | Freq: Every evening | ORAL | Status: DC
  Administered 2020-08-09 – 2020-09-05 (×28): 1000 [IU] via ORAL
  Filled 2020-08-09 (×28): qty 1

## 2020-08-09 MED ORDER — ZINC SULFATE 220 (50 ZN) MG PO CAPS
200.0000 mg | ORAL_CAPSULE | Freq: Every evening | ORAL | Status: DC
  Administered 2020-08-09 – 2020-08-27 (×19): 220 mg via ORAL
  Filled 2020-08-09 (×19): qty 1

## 2020-08-09 MED ORDER — POTASSIUM CHLORIDE 20 MEQ PO PACK: 40.0000 meq | PACK | Freq: Every day | ORAL | 30 refills | Status: DC

## 2020-08-09 MED ORDER — METOPROLOL TARTRATE 100 MG PO TABS: 100.0000 mg | ORAL_TABLET | Freq: Two times a day (BID) | ORAL | 0 refills | Status: DC

## 2020-08-09 MED ORDER — ALLOPURINOL 100 MG PO TABS: 100.0000 mg | ORAL_TABLET | Freq: Every day | ORAL | Status: DC | PRN

## 2020-08-09 MED ORDER — ENSURE ENLIVE PO LIQD
237.0000 mL | Freq: Two times a day (BID) | ORAL | Status: DC
  Administered 2020-08-10 – 2020-08-16 (×12): 237 mL via ORAL

## 2020-08-09 MED ORDER — PROCHLORPERAZINE MALEATE 5 MG PO TABS: 5.0000 mg | ORAL_TABLET | Freq: Four times a day (QID) | ORAL | Status: DC | PRN

## 2020-08-09 MED ORDER — CLONIDINE HCL 0.1 MG PO TABS
0.2000 mg | ORAL_TABLET | Freq: Two times a day (BID) | ORAL | Status: DC
  Administered 2020-08-09 – 2020-09-06 (×56): 0.2 mg via ORAL
  Filled 2020-08-09 (×56): qty 2

## 2020-08-09 MED ORDER — ASPIRIN 325 MG PO TABS
325.0000 mg | ORAL_TABLET | Freq: Every day | ORAL | Status: DC
  Administered 2020-08-10 – 2020-08-15 (×6): 325 mg via ORAL
  Filled 2020-08-09 (×6): qty 1

## 2020-08-09 MED ORDER — POTASSIUM CHLORIDE 20 MEQ PO PACK
40.0000 meq | PACK | Freq: Every day | ORAL | Status: DC
  Administered 2020-08-10: 40 meq via ORAL
  Filled 2020-08-09: qty 2

## 2020-08-09 MED ORDER — ENSURE ENLIVE PO LIQD: 237.0000 mL | Freq: Two times a day (BID) | ORAL | 12 refills | Status: DC

## 2020-08-09 MED ORDER — GUAIFENESIN-DM 100-10 MG/5ML PO SYRP: 5.0000 mL | ORAL_SOLUTION | Freq: Four times a day (QID) | ORAL | Status: DC | PRN

## 2020-08-09 MED ORDER — PROCHLORPERAZINE 25 MG RE SUPP: 12.5000 mg | Freq: Four times a day (QID) | RECTAL | Status: DC | PRN

## 2020-08-09 MED ORDER — FLEET ENEMA 7-19 GM/118ML RE ENEM: 1.0000 | ENEMA | Freq: Once | RECTAL | Status: DC | PRN

## 2020-08-09 MED ORDER — POLYETHYLENE GLYCOL 3350 17 G PO PACK: 17.0000 g | PACK | Freq: Every day | ORAL | Status: DC | PRN

## 2020-08-09 MED ORDER — PROSOURCE PLUS PO LIQD
30.0000 mL | Freq: Two times a day (BID) | ORAL | Status: DC
  Administered 2020-08-10 – 2020-08-20 (×22): 30 mL via ORAL
  Filled 2020-08-09 (×22): qty 30

## 2020-08-09 MED ORDER — ASCORBIC ACID 500 MG PO TABS
500.0000 mg | ORAL_TABLET | Freq: Every day | ORAL | Status: DC
  Administered 2020-08-10 – 2020-09-06 (×28): 500 mg via ORAL
  Filled 2020-08-09 (×28): qty 1

## 2020-08-09 MED ORDER — PANTOPRAZOLE SODIUM 40 MG PO PACK: 40.0000 mg | PACK | Freq: Every day | ORAL | 0 refills | Status: DC

## 2020-08-09 MED ORDER — PROCHLORPERAZINE EDISYLATE 10 MG/2ML IJ SOLN: 5.0000 mg | Freq: Four times a day (QID) | INTRAMUSCULAR | Status: DC | PRN

## 2020-08-09 MED ORDER — ADULT MULTIVITAMIN W/MINERALS CH: 1.0000 | ORAL_TABLET | Freq: Every day | ORAL | 0 refills | Status: AC

## 2020-08-09 MED ORDER — ENOXAPARIN SODIUM 40 MG/0.4ML ~~LOC~~ SOLN
40.0000 mg | SUBCUTANEOUS | Status: DC
  Administered 2020-08-09 – 2020-08-12 (×4): 40 mg via SUBCUTANEOUS
  Filled 2020-08-09 (×4): qty 0.4

## 2020-08-09 MED ORDER — TRAZODONE HCL 50 MG PO TABS
25.0000 mg | ORAL_TABLET | Freq: Every evening | ORAL | Status: DC | PRN
  Administered 2020-08-09 – 2020-08-30 (×7): 50 mg via ORAL
  Filled 2020-08-09 (×7): qty 1

## 2020-08-09 MED ORDER — BISACODYL 10 MG RE SUPP: 10.0000 mg | Freq: Every day | RECTAL | Status: DC | PRN

## 2020-08-09 MED ORDER — ASPIRIN 300 MG RE SUPP
300.0000 mg | Freq: Every day | RECTAL | Status: DC
  Filled 2020-08-09 (×4): qty 1

## 2020-08-09 MED ORDER — ALUM & MAG HYDROXIDE-SIMETH 200-200-20 MG/5ML PO SUSP
30.0000 mL | ORAL | Status: DC | PRN
  Filled 2020-08-09: qty 30

## 2020-08-09 MED ORDER — METFORMIN HCL 500 MG PO TABS
1000.0000 mg | ORAL_TABLET | Freq: Two times a day (BID) | ORAL | Status: DC
  Administered 2020-08-09 – 2020-09-06 (×56): 1000 mg via ORAL
  Filled 2020-08-09 (×56): qty 2

## 2020-08-09 MED ORDER — ACETAMINOPHEN 325 MG PO TABS
325.0000 mg | ORAL_TABLET | ORAL | Status: DC | PRN
  Administered 2020-08-16 – 2020-09-05 (×15): 650 mg via ORAL
  Filled 2020-08-09 (×18): qty 2

## 2020-08-09 MED ORDER — DAPAGLIFLOZIN PROPANEDIOL 10 MG PO TABS
10.0000 mg | ORAL_TABLET | Freq: Every day | ORAL | Status: DC
  Administered 2020-08-09 – 2020-08-13 (×5): 10 mg via ORAL
  Filled 2020-08-09 (×5): qty 1

## 2020-08-09 MED ORDER — ASPIRIN 325 MG PO TABS: 325.0000 mg | ORAL_TABLET | Freq: Every day | ORAL | 0 refills | Status: DC

## 2020-08-09 MED ORDER — PANTOPRAZOLE SODIUM 40 MG PO PACK
40.0000 mg | PACK | Freq: Every day | ORAL | Status: DC
  Administered 2020-08-10 – 2020-08-22 (×13): 40 mg via ORAL
  Filled 2020-08-09 (×13): qty 20

## 2020-08-09 MED ORDER — ROSUVASTATIN CALCIUM 10 MG PO TABS: 10.0000 mg | ORAL_TABLET | Freq: Every day | ORAL | 0 refills | Status: DC

## 2020-08-09 NOTE — IPOC Note (Signed)
Individualized overall Plan of Care [facility]) Patient Details Name: MOSTYN VARNELL MRN: [id number] DOB: 02/15/1981  Admitting Diagnosis: Acute cerebral infarction [facility])  Hospital Problems: Principal Problem:   Acute cerebral infarction [facility]) Active Problems:   Pressure injury of skin   Hyponatremia   AKI (acute kidney injury) (HCC)   Transaminitis   Benign essential HTN     Functional Problem List: Nursing Bladder,Bowel,Edema,Endurance,Motor,Pain,Safety,Skin Integrity  PT Balance,Behavior,Edema,Endurance,Motor,Perception,Safety,Sensory,Skin Integrity  OT Balance,Safety,Cognition,Sensory,Skin Integrity,Endurance,Vision,Motor  SLP Cognition,Safety  TR         Basic ADL's: OT Grooming,Bathing,Eating,Dressing,Toileting     Advanced  ADL's: OT       Transfers: PT Bed Mobility,Bed to Chair,Car,Furniture,Floor  OT Toilet,Tub/Shower     Locomotion: PT Ambulation,Wheelchair Mobility,Stairs     Additional Impairments: OT Fuctional Use of Upper Extremity  SLP Swallowing,Communication comprehension,expression    TR      Anticipated Outcomes Item Anticipated Outcome  Self Feeding mod I  Swallowing  Supervision A for regular/thin   Basic self-care  min-mod assist  Toileting  min assist   Bathroom Transfers min assist  Bowel/Bladder  manage bowel and bladder with min assist  Transfers  min-Mod assist  Locomotion  Supervision assist WC mobility. mod assist ambulation with PT only for short distances.  Communication  Min A for basic communication of wants/needs  Cognition  Min A for funcitonal recall and problem solving  Pain  Pain level less than 4 on scale of 0-10  Safety/Judgment  remain free of injury, prevent falls with mod I assist   Therapy Plan: PT Intensity: Minimum of 1-2 x/day ,45 to 90 minutes PT Frequency: 5 out of 7 days PT Duration Estimated Length of Stay: 3.5-4 weeks OT Intensity: Minimum of 1-2 x/day, 45 to 90 minutes OT Frequency: 5 out of 7  days OT Duration/Estimated Length of Stay: 3-4 weeks SLP Intensity: Minumum of 1-2 x/day, 30 to 90 minutes SLP Frequency: 3 to 5 out of 7 days SLP Duration/Estimated Length of Stay: 2.5-3 weeks    Team Interventions: Nursing Interventions Patient/Family Education,Pain Management,Bladder Management,Medication Management,Bowel Management,Skin Care/Wound Management,Psychosocial Support,Discharge Planning,Disease Management/Prevention  PT interventions Ambulation/gait training,Balance/vestibular training,Cognitive remediation/compensation,Community reintegration,Discharge planning,Disease Engineer, production stimulation,Functional mobility training,Neuromuscular re-education,Pain management,Patient/family education,Psychosocial support,Skin care/wound management,Splinting/orthotics,Therapeutic Exercise,Stair training,Therapeutic Activities,UE/LE Strength taining/ROM,Visual/perceptual remediation/compensation,UE/LE Psychiatrist propulsion/positioning  OT Interventions Balance/vestibular training,Discharge planning,Functional electrical stimulation,Pain management,Self Care/advanced ADL retraining,Therapeutic Activities,UE/LE Coordination activities,Cognitive remediation/compensation,Disease mangement/prevention,Functional mobility training,Patient/family education,Skin care/wound managment,Therapeutic Exercise,Visual/perceptual remediation/compensation,DME/adaptive equipment instruction,Neuromuscular re-education,Psychosocial support,Splinting/orthotics,UE/LE Strength taining/ROM,Wheelchair propulsion/positioning,Community reintegration  SLP Interventions Cognitive remediation/compensation,Multimodal communication approach,Functional tasks,Therapeutic Activities,Therapeutic Exercise,Oral motor exercises,Dysphagia/aspiration precaution training,Patient/family education  TR Interventions    SW/CM Interventions     Barriers to  Discharge MD  Medical stability, Weight, and New oxygen  Nursing      PT Inaccessible home environment,Home environment access/layout,Incontinence,Insurance for SNF coverage,Behavior,Wound Care    OT      SLP      SW       Team Discharge Planning: Destination: PT-Home ,OT- Home , SLP-Home Projected Follow-up: PT-Home health PT,Skilled nursing facility, OT-  24 hour supervision/assistance,Home health OT, SLP-Outpatient SLP,Home Health SLP Projected Equipment Needs: PT-To be determined,Wheelchair (measurements),Wheelchair cushion (measurements), OT- To be determined, SLP-To be determined Equipment Details: PT- , OT-Pt currently does not own any DME Patient/family involved in discharge planning: PT- Patient,Family member/caregiver,  OT-Patient, SLP-Patient  MD ELOS: 18-22 days. Medical Rehab Prognosis:  Good Assessment: Authur L. Crescenzo is a 40 year old male with history of HTN--poorly controlled (Dr. Tomie China), Morbid obesity, T2DM, OSA, diagnosis of  Covid 19 less than one week prior to admission on 07/24/2020 with dysarthria, right hemiparesis, progressing to right facial droop with inability to speak.  History taken from chart review and girlfriend due to aphasia.  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  CT perfusion showed large area of hypoperfusion without core infarct. CT angioplasty of MC attempted without success due to significant resistance and suspected to be chronic.  He tolerated extubation without difficulty and respiratory status has been stable--therefore no indications to treat Covid.  Echocardiogram with ejection fraction of 60-65%.  Dr. Pearlean Brownie recommended DAPT x3 months followed by aspirin alone.  Bedside swallow showed mild oropharyngeal dysphagia and patient placed on D3 thin liquid diet.  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  Will set goals for Min A with PT/OT/SLP.   Due to the current state of emergency, patients may not be receiving their 3-hours of Medicare-mandated therapy.  See Team Conference Notes for weekly updates to the plan of care

## 2020-08-09 NOTE — Plan of Care (Signed)
  Problem: Education: Goal: Knowledge of General Education information will improve Description: Including pain rating scale, medication(s)/side effects and non-pharmacologic comfort measures Outcome: Progressing   Problem: Safety: Goal: Ability to remain free from injury will improve Outcome: Progressing   Problem: Ischemic Stroke/TIA Tissue Perfusion: Goal: Complications of ischemic stroke/TIA will be minimized Outcome: Progressing   

## 2020-08-09 NOTE — Plan of Care (Signed)

## 2020-08-09 NOTE — Progress Notes (Signed)
Signed     Expand All Collapse All             Physical Medicine and Rehabilitation Consult     Reason for Consult: Functional deficits due to stroke/Covid Referring Physician: Dr. Natale Milch     HPI: Justin Mason is a 40 y.o. male with history of HTN, obesity, T2DM, HTN, recent diagnosis of Covid who was admitted on 07/24/20 with right facial droop, RUE weakness and difficulty talking. CTA head/neck with perfusion showed short segment occlusion of proximal R-M1/MCA, occlusion of L-ICA terminus and proximal L-M1 segment, moderate stenosis right P2/PCA and diffuse intracranial vasculopathy question severe atherosclerosis v/s vasculitis. CT angioplasty of MCA attempted without success due to significant resistance and suspected to be chronic. He tolerated extubation without difficulty and respiratory status stable--->no indication for Covid Tx. 2D echo showed EF 60-65%. Dr. Pearlean Brownie recommends DAPT x 3 months followed by ASA alone. BSS showed mild oral dysphagia and patient on DIII, thins. Patient with resultant Right hemiplegia with right inattention and severe R-lean, global aphasia and depressed mood affecting ADLs and mobility. Physical Medicine & Rehabilitation was consulted to assess candidacy for CIR given functional decline. Off COVID precautions.      Review of Systems  Unable to perform ROS: Other            Past Medical History:  Diagnosis Date  . Arthritis    . Cardiac murmur 05/24/2020  . Cellulitis 01/03/2015  . Cellulitis of right leg    . Cellulitis of right lower extremity 01/03/2015  . Essential hypertension    . GERD (gastroesophageal reflux disease)    . High cholesterol    . Hyperlipemia    . Hypertension    . Morbid obesity (HCC) 05/24/2020  . Rotator cuff tear, right 09/08/2011  . Sepsis (HCC) 01/03/2015  . Type 2 diabetes mellitus with hyperglycemia [facility])             Past Surgical History:  Procedure Laterality Date  . INCISION AND DRAINAGE FOOT    1991-1992    STEPPED ON NAIL / INFECTED WOUND  . IR ANGIO INTRA EXTRACRAN SEL COM CAROTID INNOMINATE BILAT MOD SED   07/24/2020  . IR ANGIO VERTEBRAL SEL SUBCLAVIAN INNOMINATE UNI L MOD SED   07/24/2020  . IR ANGIO VERTEBRAL SEL VERTEBRAL UNI R MOD SED   07/24/2020  . RADIOLOGY WITH ANESTHESIA N/A 07/24/2020    Procedure: IR WITH ANESTHESIA - CODE STROKE;  Surgeon: Radiologist, Medication, MD;  Location: [facility] OR;  Service: Radiology;  Laterality: N/A;  . SHOULDER SURGERY        September 08, 2011           Family History  Problem Relation Age of Onset  . Anesthesia problems Sister    . Prostate cancer Other    . Colon cancer Other        Social History:  Lives with mother. Mother is oxygen dependent. 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 Per reports that he has never smoked. His smokeless tobacco use includes snuff-- a can/day. Per  reports current alcohol --beer use of about 7.0 standard drinks of alcohol per week. He reports that he does not use drugs.      Allergies: No Known Allergies    Medications Prior to Admission  Medication Sig Dispense Refill  . allopurinol (ZYLOPRIM) 100 MG tablet Take 100 mg by mouth as needed (gout).      . Ascorbic Acid (VITAMIN C PO)  Take 1 tablet by mouth every evening.      . Cholecalciferol (VITAMIN D-3) 25 MCG (1000 UT) CAPS Take 1,000 Units by mouth every evening.      . dapagliflozin propanediol (FARXIGA) 10 MG TABS tablet Take 10 mg by mouth daily.      Marland Kitchen esomeprazole (NEXIUM) 20 MG capsule Take 20 mg by mouth daily.      . meloxicam (MOBIC) 15 MG tablet Take 15 mg by mouth daily.      . metFORMIN (GLUCOPHAGE) 1000 MG tablet Take 1,000 mg by mouth 2 (two) times daily.      . metoprolol succinate (TOPROL-XL) 100 MG 24 hr tablet Take 100 mg by mouth 2 (two) times daily.      Marland Kitchen olmesartan-hydrochlorothiazide (BENICAR HCT) 40-25 MG tablet Take 1 tablet by mouth daily.      . Zinc 50 MG TABS Take 200 mg by mouth every evening.      Marland Kitchen amLODipine  (NORVASC) 10 MG tablet Take 1 tablet (10 mg total) by mouth daily. (Patient not taking: No sig reported) 30 tablet 12  . cloNIDine (CATAPRES) 0.1 MG tablet Take 1 tablet (0.1 mg total) by mouth 2 (two) times daily. (Patient not taking: No sig reported) 60 tablet 6      Home: Home Living Family/patient expects to be discharged to:: Private residence Living Arrangements: Parent Available Help at Discharge: Family,Available 24 hours/day Type of Home: House Home Access: Stairs to enter [employer] of Steps: 4 Home Layout: One level Bathroom Shower/Tub: Associate Professor: Yes Home Equipment: None  Lives With: Family  Functional History: Prior Function Level of Independence: Independent Comments: Pt is a Landscape architect (heavy lifting, pipe carrying, HVAC unit repair). Information obtained from calling pt's mother, [name] Functional Status:  Mobility: Bed Mobility Overal bed mobility: Needs Assistance Bed Mobility: Rolling,Sidelying to Sit Rolling: Max assist Sidelying to sit: +2 for physical assistance,+2 for safety/equipment,HOB elevated,Mod assist Supine to sit: Mod assist,HOB elevated Sit to supine: Max assist,+2 for safety/equipment General bed mobility comments: Pt sitting up in stedy with OT upon arrival. Transfers Overall transfer level: Needs assistance Equipment used: Ambulation equipment used Transfer via Lift Equipment: Stedy (sara) Transfers: Sit to/from [employer] to Stand: Mod assist Stand pivot transfers: Total assist  Lateral/Scoot Transfers: Mod assist,+2 physical assistance General transfer comment: Pt sitting up in stedy with OT upon arrival, with OT handing pt off to PT. Pt able to come to stand from stedy with min-modA and tactile and verbal cues at R glut and quad to extend hip and knee. Verbal and visual cues to maintain midline, cuing pt to watch his head/shoulders between  the 2 grey bars of the stedy, mod success. TA on stedy to pivot to chair from EOB. Ambulation/Gait General Gait Details: Pre-gait training in stedy, cuing pt to shift weight laterally between R and L leg, needing min-modAx1 to maintain balance due to intermittent R hip and knee flexion with stance phase and R lean. Provided extensive verbal, visual, and tactile cues to extend R hip and knee with R stance, mod success. x3 bouts of ~1-2 min each.   ADL: ADL Overall ADL's : Needs assistance/impaired Eating/Feeding: Set up,Sitting Eating/Feeding Details (indicate cue type and reason): able to bring cup to mouth with LUE wit set- up assist Grooming: Wash/dry face,Moderate assistance,Sitting Grooming Details (indicate cue type and reason): wiping face with L UE and neglected R side of face Upper Body Bathing:  Total assistance Lower Body Bathing: Total assistance,Sit to/from stand,Moderate assistance Lower Body Bathing Details (indicate cue type and reason): total A for simulated LB bathing tasks via posterior pericare, MOD A for standing balance in stedy d/t R lateral lean Upper Body Dressing : Total assistance Lower Body Dressing: Total assistance Toilet Transfer: Moderate assistance,+2 for physical assistance,Total assistance Toilet Transfer Details (indicate cue type and reason): simualated via stedy transfer to recliner, MOD A +2 to power into standing with stedy,total A for pivot with stedy, pt requries assist to manage RUE on stedy and physical assist at RLE to come into full extension Toileting- Clothing Manipulation and Hygiene: Total assistance,Moderate assistance,Sit to/from stand Toileting - Clothing Manipulation Details (indicate cue type and reason): MOD A for standing balance in stedy, total A for posterior pericare in standing Functional mobility during ADLs: Moderate assistance,+2 for physical assistance,+2 for safety/equipment (sit<>stand in stedy) General ADL Comments: pt making good  progress, completing x4 sit<>stands with MOD A +1, 1 from EOB and x3 from seat of stedy   Cognition: Cognition Overall Cognitive Status: Impaired/Different from baseline Arousal/Alertness: Awake/alert Orientation Level: Oriented X4 Attention: Sustained Sustained Attention: Impaired Memory:  (will asssess as able) Awareness: Impaired Awareness Impairment: Emergent impairment Problem Solving: Impaired Behaviors: Lability Safety/Judgment: Impaired Cognition Arousal/Alertness: Awake/alert Behavior During Therapy: Flat affect,Impulsive Overall Cognitive Status: Impaired/Different from baseline Area of Impairment: Following commands,Safety/judgement,Awareness,Problem solving Orientation Level: Disoriented to,Place Following Commands: Follows one step commands with increased time,Follows one step commands consistently Safety/Judgement: Decreased awareness of safety,Decreased awareness of deficits Awareness: Emergent Problem Solving: Difficulty sequencing,Decreased initiation,Requires verbal cues,Requires tactile cues General Comments: Pt follows simple mobility commands, can be impulsive at times. Required simple commands to sequence tasks. Flat affect throughout. Difficult to assess due to: Impaired communication (globally aphasic)     Blood pressure 130/81, pulse 64, temperature 98.5 F (36.9 C), temperature source Oral, resp. rate 18, weight (!) 166.3 kg, SpO2 97 %. General: Alert, No apparent distress HEENT: Head is normocephalic, atraumatic, PERRLA, EOMI, sclera anicteric, oral mucosa pink and moist, dentition intact, ext ear canals clear,  Neck: Supple without JVD or lymphadenopathy Heart: Reg rate and rhythm. No murmurs rubs or gallops Chest: CTA bilaterally without wheezes, rales, or rhonchi; no distress Abdomen: Soft, non-tender, non-distended, bowel sounds positive. Extremities: No clubbing, cyanosis, or edema. Pulses are 2+ Psych: Pt's affect is appropriate. Pt is  cooperative. Emotional at times. Skin: Clean and intact without signs of breakdown Neuro: Makes good eye contact. 0/5 strength right side. No verbal output. Performing sit to stand with stedy ModAx2. Impaired problem solving, slow to follow command, decreased safety awareness.        Lab Results Last 24 Hours       Results for orders placed or performed during the hospital encounter of 07/24/20 (from the past 24 hour(s))  Glucose, capillary     Status: Abnormal    Collection Time: 08/04/20 12:13 PM  Result Value Ref Range    Glucose-Capillary 117 (H) 70 - 99 mg/dL  Glucose, capillary     Status: Abnormal    Collection Time: 08/04/20  4:22 PM  Result Value Ref Range    Glucose-Capillary 171 (H) 70 - 99 mg/dL  Glucose, capillary     Status: Abnormal    Collection Time: 08/04/20  9:32 PM  Result Value Ref Range    Glucose-Capillary 160 (H) 70 - 99 mg/dL  Glucose, capillary     Status: Abnormal    Collection Time: 08/05/20  6:31 AM  Result  Value Ref Range    Glucose-Capillary 161 (H) 70 - 99 mg/dL      Imaging Results (Last 48 hours)  No results found.       Assessment/Plan: Diagnosis: Multiple occlusions: R MCA, L ICA, PCA stenosis 1. Does the need for close, 24 hr/day medical supervision in concert with the patient's rehab needs make it unreasonable for this patient to be served in a less intensive setting? Yes 2. Co-Morbidities requiring supervision/potential complications:  1. Morbid obesity: 166.3kg 2. DM2 3. HLD 4. HTN: Will require regular monitoring of vitals. 5. COVID infection 1 week prior to admission. 6. Right sided severe weakness: will require intensive OT and OT 7. Expressive >receptive aphasia: will require intensive SLP 3. Due to bladder management, bowel management, safety, skin/wound care, disease management, medication administration, pain management and patient education, does the patient require 24 hr/day rehab nursing? Yes 4. Does the patient require  coordinated care of a physician, rehab nurse, therapy disciplines of PT, OT, SLP to address physical and functional deficits in the context of the above medical diagnosis(es)? Yes Addressing deficits in the following areas: balance, endurance, locomotion, strength, transferring, bowel/bladder control, bathing, dressing, feeding, grooming, toileting, cognition, speech, language and psychosocial support 5. Can the patient actively participate in an intensive therapy program of at least 3 hrs of therapy per day at least 5 days per week? Yes 6. The potential for patient to make measurable gains while on inpatient rehab is good 7. Anticipated functional outcomes upon discharge from inpatient rehab are min assist  with PT, min assist with OT, min assist with SLP. 8. Estimated rehab length of stay to reach the above functional goals is: 22-24 days 9. Anticipated discharge destination: Home 10. Overall Rehab/Functional Prognosis: excellent   RECOMMENDATIONS: This patient's condition is appropriate for continued rehabilitative care in the following setting: CIR Patient has agreed to participate in recommended program. Yes Note that insurance prior authorization may be required for reimbursement for recommended care.   Comment: Thank you for this consult. Admission coordinator to follow.    I have personally performed a face to face diagnostic evaluation, including, but not limited to relevant history and physical exam findings, of this patient and developed relevant assessment and plan.  Additionally, I have reviewed and concur with the physician assistant's documentation above.   Sula Soda, MD   Jacquelynn Cree, PA-C 08/05/2020

## 2020-08-09 NOTE — Progress Notes (Signed)
Inpatient Rehab Admissions Coordinator:  There is a bed available in CIR to admit pt today. Dr. Natale Milch is aware and in agreement. NSG, TOC, pt, and family made aware.   Wolfgang Phoenix, MS, CCC-SLP Admissions Coordinator [phone]

## 2020-08-09 NOTE — Progress Notes (Deleted)
PROGRESS NOTE    Justin Mason  PVG:[id number] DOB: 01/03/1981 DOA: 07/24/2020 PCP: Eber Jones, NP   Brief Narrative:   40 yo M PMH morbid obesity, DM2, HLD, HTN, known COVID-19 infection (dx less than 1 week PTA) who presented to ED 1/19 with aphasia and RUE weakness beginning at about 1030 AM 07/24/2020 and progressing until 1230 same day with R facial droop. CT H with concern for L sided infarcts CTA with short segment occlusion of R M1/MCA, L ICA occlusion, proximal L M1, stenosis of PCAs. Outside of window for tpa. Went to [facility]for revascularization but angioplasty of MCA unsuccessful due to resistance. Patient continues to progress daily with speech and PT - on covid precautions through 08/02/20 which limits disposition. Hopefully now that off covid (as of 08/03/20) he will be able to dispo to CIR vs SNF given ongoing needs. CIR currently following for evaluation. Medically stable for discharge if they agree he meets criteria.  Assessment & Plan:  L ICA occlusion, severe R MCA stenosis - Presenting with right side weakness and aphasia - Unsuccessful revascularization due to chronicity - Cause of decompensation of chronic disease unclear - possible hypoglycemic episode.  - Neuro eval'd: DAPT for 3 months then transition to single agent - Continue statin - SLP following - diet improving - back to regular/thins currently - 1/25-2/1: Continuing to work with rehab services; still working on placement (limited due to previous covid/quarantine precautions).  Incidental covid positive - Acute respiratory failure post procedure - Hypoxia due to procedure; resolved - No current indication for COVID treatment   - Off quarantine 08/03/20 - 1/25: Stable on RA  Hypertension.  - Completed permissive HTN window - HCTZ, irbesartan, clonidine, metoprolol - BP stable - continue to follow  Hypokalemia - Resolved - no longer following given improved PO intake  DVT prophylaxis:  Heparin Code Status: FULL Family Communication: Mother updated at length about prognosis/disposition Status is: Inpatient  Remains inpatient appropriate because:Inpatient level of care appropriate due to severity of illness  Dispo: The patient is from: Home             Anticipated d/c is to: SNF vs CIR             Anticipated d/c date is: 24-48h             Patient currently is medically stable to d/c.  Difficult to place patient No  Consultants:   Neurology  Subjective: No acute events overnight - still poorly verbal but smiling this am and much more pleasant/hopeful than last week.  Objective: Vitals:   08/09/20 0000 08/09/20 0425 08/09/20 0827 08/09/20 1123  BP: (!) 104/57 111/71 130/60 (!) 108/55  Pulse: 63 67 71 66  Resp: 19 18 16 18   Temp: 97.8 F (36.6 C) 97.7 F (36.5 C) 97.8 F (36.6 C) 97.7 F (36.5 C)  TempSrc: Oral  Oral Oral  SpO2: 98% 98% 98% 98%  Weight:        Intake/Output Summary (Last 24 hours) at 08/09/2020 1504 Last data filed at 08/09/2020 0827 Gross per 24 hour  Intake 120 ml  Output -  Net 120 ml   Filed Weights   07/25/20 0604 07/26/20 1540 08/08/20 0406  Weight: (!) 172.6 kg (!) 166.3 kg (!) 159.1 kg   Examination:  General: 40 y.o. male resting in bed in NAD Eyes: PERRL, normal sclera ENMT: Nares patent w/o discharge, orophaynx clear, dentition normal, ears w/o discharge/lesions/ulcers Neck: Supple, trachea midline Cardiovascular: RRR, +  S1, S2, no m/g/r, equal pulses throughout Respiratory: CTABL, no w/r/r, normal WOB GI: BS+, NDNT, no masses noted, no organomegaly noted Neuro: Alert, following commands, RUE/RLE profound weakness/diminished sensation  Data Reviewed: I have personally reviewed following labs and imaging studies  CBC: No results for input(s): WBC, NEUTROABS, HGB, HCT, MCV, PLT in the last 168 hours. Basic Metabolic Panel: Recent Labs  Lab 08/08/20 0053  K 4.0  MG 1.9   GFR: Estimated Creatinine Clearance:  163.1 mL/min (by C-G formula based on SCr of 0.96 mg/dL).   Liver Function Tests: No results for input(s): AST, ALT, ALKPHOS, BILITOT, PROT, ALBUMIN in the last 168 hours. No results for input(s): LIPASE, AMYLASE in the last 168 hours. No results for input(s): AMMONIA in the last 168 hours.  Coagulation Profile: No results for input(s): INR, PROTIME in the last 168 hours. Cardiac Enzymes: No results for input(s): CKTOTAL, CKMB, CKMBINDEX, TROPONINI in the last 168 hours.  BNP (last 3 results) No results for input(s): PROBNP in the last 8760 hours.  HbA1C: No results for input(s): HGBA1C in the last 72 hours.  CBG: Recent Labs  Lab 08/08/20 1145 08/08/20 1550 08/08/20 2126 08/09/20 0612 08/09/20 1121  GLUCAP 138* 137* 161* 167* 173*   Lipid Profile: No results for input(s): CHOL, HDL, LDLCALC, TRIG, CHOLHDL, LDLDIRECT in the last 72 hours.  Thyroid Function Tests: No results for input(s): TSH, T4TOTAL, FREET4, T3FREE, THYROIDAB in the last 72 hours.  Anemia Panel: No results for input(s): VITAMINB12, FOLATE, FERRITIN, TIBC, IRON, RETICCTPCT in the last 72 hours.  Sepsis Labs: No results for input(s): PROCALCITON, LATICACIDVEN in the last 168 hours. No results found for this or any previous visit (from the past 240 hour(s)).  Radiology Studies: No results found.  Scheduled Meds: . vitamin C  500 mg Oral Daily  . aspirin  300 mg Rectal Daily   Or  . aspirin  325 mg Oral Daily  . cholecalciferol  1,000 Units Oral QPM  . cloNIDine  0.2 mg Oral BID  . feeding supplement  237 mL Oral BID BM  . heparin injection (subcutaneous)  7,500 Units Subcutaneous Q8H  . hydrochlorothiazide  25 mg Oral Daily  . insulin aspart  0-15 Units Subcutaneous TID AC & HS  . irbesartan  300 mg Oral Daily  . mouth rinse  15 mL Mouth Rinse BID  . meloxicam  15 mg Oral Daily  . metFORMIN  1,000 mg Oral BID WC  . metoprolol tartrate  100 mg Oral BID  . multivitamin with minerals  1 tablet  Oral Daily  . pantoprazole sodium  40 mg Oral Daily  . potassium chloride  40 mEq Oral Daily  . rosuvastatin  10 mg Oral Daily  . sertraline  25 mg Oral Daily  . zinc sulfate  220 mg Oral QPM    LOS: 16 days   Time spent: 25 minutes  Azucena Fallen, DO [facility]  If 7PM-7AM, please contact night-coverage [email or web address] 08/09/2020, 3:04 PM

## 2020-08-09 NOTE — Progress Notes (Signed)
Signed        PMR Admission Coordinator Pre-Admission Assessment   Patient: Justin Mason is an 40 y.o., male MRN: [id number] DOB: [birth year] Height:   Weight: (!) 159.1 kg                                                                                                                                                  Insurance Information HMO:     PPO: yes     PCP:      IPA:      80/20:      OTHER:  PRIMARYLandis Martins      Policy#: [id number]     Subscriber: Pt.  CM Name: Butch Penny      Phone#: [phone]     Fax#: [phone] Received insurance authorization from [place] for 12 days for admission 2/2. Clinical updates are due 2/7 Pre-Cert#: [id number]      Employer:  Benefits:  Phone #:  [email or web address]      Name: Barbette Hair Date: 06/06/2019 06/04/2021 Deductible: $5,500 (2,981.46 met) OOP Max: $8,550 (includes deductible - $2.208.54) CIR: 70% coverage, 30% co-insurance SNF: 70% coverage, 30% co-insurance; auth required Outpatient: $75 per visit, auth not required Home Health:  60 visits per service year DME: 100% coverage Providers:  In network      SECONDARY:      Policy#:       Phone#:    Development worker, community:       Phone#:    The Engineer, [employer]" for patients in Inpatient Rehabilitation Facilities with attached "Privacy Act [place] Records" was provided and verbally reviewed with: Patient   Emergency Contact Information         Contact Information     Name Relation Home Work Mobile    Bath Mother [phone]        Illene Regulus   [phone]   [phone]    Ibrahem, [place] Father [phone]   [phone]       Current Medical History  Patient Admitting Diagnosis: CVA History of Present Illness: 40 yo M PMH morbid obesity, DM2, HLD, HTN, known COVID-19 infection (dx less than 1 week PTA) who presented to ED 1/19 with aphasia and RUE weakness beginning at about 1030 AM 07/24/2020 and progressing until 1230 same day  with R facial droop. CT H with concern for L sided infarcts CTA with short segment occlusion of R M1/MCA, L ICA occlusion, proximal L M1, stenosis of PCAs. Outside of window for tpa. Went to [facility] 07/24/20 for revascularization but angioplasty of MCA unsuccessful due to resistance.   Complete NIHSS TOTAL: 15 Glasgow Coma Scale Score: 15   Past Medical History      Past Medical History:  Diagnosis Date  . Arthritis    . Cardiac murmur 05/24/2020  . Cellulitis 01/03/2015  . Cellulitis of right  leg    . Cellulitis of right lower extremity 01/03/2015  . Essential hypertension    . GERD (gastroesophageal reflux disease)    . High cholesterol    . Hyperlipemia    . Hypertension    . Morbid obesity ([place]) 05/24/2020  . Rotator cuff tear, right 09/08/2011  . Sepsis ([place]) 01/03/2015  . Type 2 diabetes mellitus with hyperglycemia (HCC)        Family History  family history includes Anesthesia problems in his sister; Colon cancer in an other family member; Prostate cancer in an other family member.   Prior Rehab/Hospitalizations:  Has the patient had prior rehab or hospitalizations prior to admission? No   Has the patient had major surgery during 100 days prior to admission? Yes   Current Medications    Current Facility-Administered Medications:  .  0.9 %  sodium chloride infusion, 250 mL, Intravenous, Continuous, Agarwala, Ravi, MD .  acetaminophen (TYLENOL) tablet 650 mg, 650 mg, Oral, Q4H PRN **OR** acetaminophen (TYLENOL) 160 MG/5ML solution 650 mg, 650 mg, Per Tube, Q4H PRN **OR** acetaminophen (TYLENOL) suppository 650 mg, 650 mg, Rectal, Q4H PRN, Kipp Brood, MD, 650 mg at 07/26/20 0037 .  allopurinol (ZYLOPRIM) tablet 100 mg, 100 mg, Oral, Daily PRN, Agarwala, Ravi, MD .  ascorbic acid (VITAMIN C) tablet 500 mg, 500 mg, Oral, Daily, Greta Doom, MD, 500 mg at 08/09/20 0830 .  aspirin suppository 300 mg, 300 mg, Rectal, Daily, 300 mg at 07/26/20 1059 **OR** aspirin tablet 325  mg, 325 mg, Oral, Daily, Agarwala, Ravi, MD, 325 mg at 08/09/20 3299 .  cholecalciferol (VITAMIN D3) tablet 1,000 Units, 1,000 Units, Oral, QPM, Kipp Brood, MD, 1,000 Units at 08/08/20 1822 .  cloNIDine (CATAPRES) tablet 0.2 mg, 0.2 mg, Oral, BID, [name], MD, 0.2 mg at 08/09/20 0829 .  feeding supplement (ENSURE ENLIVE / ENSURE PLUS) liquid 237 mL, 237 mL, Oral, BID BM, Greta Doom, MD, 237 mL at 08/09/20 [phone] .  fentaNYL (SUBLIMAZE) injection 50 mcg, 50 mcg, Intravenous, Q15 min PRN, Agarwala, Ravi, MD .  fentaNYL (SUBLIMAZE) injection 50-200 mcg, 50-200 mcg, Intravenous, Q30 min PRN, Agarwala, Ravi, MD .  heparin injection 7,500 Units, 7,500 Units, Subcutaneous, Q8H, Agarwala, Ravi, MD, 7,500 Units at 08/09/20 0616 .  hydrochlorothiazide (HYDRODIURIL) tablet 25 mg, 25 mg, Oral, Daily, Greta Doom, MD, 25 mg at 08/09/20 0829 .  insulin aspart (novoLOG) injection 0-15 Units, 0-15 Units, Subcutaneous, TID AC & HS, [name], MD, 3 Units at 08/09/20 [phone] .  irbesartan (AVAPRO) tablet 300 mg, 300 mg, Oral, Daily, Greta Doom, MD, 300 mg at 08/09/20 0830 .  labetalol (NORMODYNE) injection 5 mg, 5 mg, Intravenous, Q10 min PRN, Kipp Brood, MD .  MEDLINE mouth rinse, 15 mL, Mouth Rinse, BID, Agarwala, Ravi, MD, 15 mL at 08/09/20 0838 .  meloxicam (MOBIC) tablet 15 mg, 15 mg, Oral, Daily, Greta Doom, MD, 15 mg at 08/09/20 9622 .  metFORMIN (GLUCOPHAGE) tablet 1,000 mg, 1,000 mg, Oral, BID WC, Greta Doom, MD, 1,000 mg at 08/09/20 0830 .  metoprolol tartrate (LOPRESSOR) tablet 100 mg, 100 mg, Oral, BID, Agarwala, Ravi, MD, 100 mg at 08/09/20 0830 .  multivitamin with minerals tablet 1 tablet, 1 tablet, Oral, Daily, Greta Doom, MD, 1 tablet at 08/09/20 0831 .  pantoprazole sodium (PROTONIX) 40 mg/20 mL oral suspension 40 mg, 40 mg, Oral, Daily, Greta Doom, MD, 40 mg at 08/09/20 0831 .  potassium  chloride (KLOR-CON)  packet 40 mEq, 40 mEq, Oral, Daily, Kyle, Tyrone A, DO, 40 mEq at 08/09/20 0829 .  rosuvastatin (CRESTOR) tablet 10 mg, 10 mg, Oral, Daily, Agarwala, Ravi, MD, 10 mg at 08/09/20 0829 .  sertraline (ZOLOFT) tablet 25 mg, 25 mg, Oral, Daily, Dagar, Anjali, MD, 25 mg at 08/09/20 0831 .  zinc sulfate capsule 220 mg, 220 mg, Oral, QPM, Agarwala, Ravi, MD, 220 mg at 08/08/20 1822   Patients Current Diet:     Diet Order                      Diet - low sodium heart healthy              Diet Carb Modified Fluid consistency: Thin; Room service appropriate? No  Diet effective now                      Precautions / Restrictions Precautions Precautions: Fall Precaution Comments: R inattention, R side flaccid Restrictions Weight Bearing Restrictions: No    Has the patient had 2 or more falls or a fall with injury in the past year?No   Prior Activity Level Community (5-7x/wk): Pt. was active in the community, working PTA   Prior Functional Level Prior Function Level of Independence: Independent Comments: Pt is a Teacher, music (heavy lifting, pipe carrying, HVAC unit repair). Information obtained from calling pt's mother, [name]   Self Care: Did the patient need help bathing, dressing, using the toilet or eating?  Independent   Indoor Mobility: Did the patient need assistance with walking from room to room (with or without device)? Independent   Stairs: Did the patient need assistance with internal or external stairs (with or without device)? Independent   Functional Cognition: Did the patient need help planning regular tasks such as shopping or remembering to take medications? Independent   Home Assistive Devices / Equipment Home Equipment: None   Prior Device Use: Indicate devices/aids used by the patient prior to current illness, exacerbation or injury? None of the above   Current Functional Level Cognition   Arousal/Alertness: Awake/alert Overall  Cognitive Status: Difficult to assess Difficult to assess due to: Impaired communication (globally aphasic, receptive improving) Orientation Level: Oriented to person,Oriented to place,Oriented to situation Following Commands: Follows one step commands with increased time,Follows one step commands consistently Safety/Judgement: Decreased awareness of safety,Decreased awareness of deficits General Comments: pt following all commands and nodding appropriately during session, however pt making no effort to verbalize during session. Urine and bowel incontinence in stedy this date with pt acknowledgeing he was unaware of need to use restroom. Attention: Sustained Sustained Attention: Impaired Memory:  (will asssess as able) Awareness: Impaired Awareness Impairment: Emergent impairment Problem Solving: Impaired Behaviors: Lability Safety/Judgment: Impaired    Extremity Assessment (includes Sensation/Coordination)   Upper Extremity Assessment: RUE deficits/detail RUE Deficits / Details: flaccid RUE Sensation: decreased proprioception,decreased light touch RUE Coordination: decreased gross motor,decreased fine motor  Lower Extremity Assessment: Defer to PT evaluation RLE Deficits / Details: Hypertonicity of plantarflexors, hip extensors. 2-/5 knee extension (sitting), 1/5 knee flexion (supine), 1/5 toe flex/ext RLE Sensation:  (per pt report during screen, unsure of accuracy) RLE Coordination: decreased fine motor,decreased gross motor     ADLs   Overall ADL's : Needs assistance/impaired Eating/Feeding: Set up,Sitting Eating/Feeding Details (indicate cue type and reason): able to bring cup to mouth with LUE wit set- up assist Grooming: Wash/dry hands,Applying deodorant,Minimal assistance,Sitting Grooming Details (indicate cue type and reason): Patient  able to wash face seated in recliner with set-up assist. Would likely require Min A for oral hygiene 2/2 flaccid paralysis in RUE. Upper Body  Bathing: Moderate assistance,Sitting (progressing to Min A) Upper Body Bathing Details (indicate cue type and reason): Patient able to wash RUE, chest and abdomen. Cues for hemi technique to wash LUE seated in recliner. Lower Body Bathing: Total assistance,Sit to/from stand Lower Body Bathing Details (indicate cue type and reason): total A for posterior pericare d/t incontinent bladder Upper Body Dressing : Moderate assistance,Sitting Upper Body Dressing Details (indicate cue type and reason): Education on hemi technique and Mod A to don anterior hospital gown. Lower Body Dressing: Total assistance Toilet Transfer: Moderate assistance,+2 for physical assistance,Total assistance Toilet Transfer Details (indicate cue type and reason): MOD A +2 to stand to stedy, total A with stedy to pivot to recliner Toileting- Clothing Manipulation and Hygiene: Total assistance,Sit to/from stand Toileting - Clothing Manipulation Details (indicate cue type and reason): Total A for hygiene management in standing with use of Stedy. Functional mobility during ADLs: Moderate assistance,+2 for physical assistance (sit<>stand to stedy) General ADL Comments: pt continues to make excellent progress able to complete seated grooming tasks using RUE as stabilizer needing total A hand over hand assist to maintain functional grasp on toothbrush. pt completed multiple sit<>stands from stedy with MOD A +2. pt continues to present with impaired balance, however utilized mirror for visual feedback to assist with maintaining balance. pt with good carryover     Mobility   Overal bed mobility: Needs Assistance Bed Mobility: Rolling,Sidelying to Sit,Sit to Supine Rolling: Min assist,Mod assist Sidelying to sit: Min assist,HOB elevated Supine to sit: Mod assist,HOB elevated,+2 for physical assistance Sit to supine: Mod assist General bed mobility comments: Pt able to roll to the L with use of bed rails and AAROM at R arm to bring  across body with placement of R foot on bed and cues to push, with minA. Cues to bring knees to chest with minA and to transition onto L shoulder > elbow > hand to ascend trunk with some assistance. Return to supine with modA at legs.     Transfers   Overall transfer level: Needs assistance Equipment used: Ambulation equipment used Transfer via Lift Equipment: Stedy Transfers: Sit to/from Stand Sit to Stand: Mod assist Stand pivot transfers: Total assist (sara stedy)  Lateral/Scoot Transfers: Mod assist,+2 physical assistance General transfer comment: pt performed sit>stand 1x from bed to stedy with modA and 3x from stedy with minA cuing pt to focus on maintaining midline on the way up. Tactile cues at R quad to extend.     Ambulation / Gait / Stairs / Wheelchair Mobility   Ambulation/Gait General Gait Details: pregait training x2 bouts of ~2-3 min each in stedy with wt shifting with tactile cues and block at R knee and gluts, min guard when standing midline and up to minA when shifting to R. R quad muscle activation noted this date, cuing pt to push with R leg to shift to L, success.     Posture / Balance Dynamic Sitting Balance Sitting balance - Comments: 1 UE support with no feet support to sit statically EOB Balance Overall balance assessment: Needs assistance Sitting-balance support: Single extremity supported,Feet unsupported Sitting balance-Leahy Scale: Poor Sitting balance - Comments: 1 UE support with no feet support to sit statically EOB Postural control: Right lateral lean Standing balance support: Bilateral upper extremity supported,During functional activity Standing balance-Leahy Scale: Poor Standing balance comment: heavy reliance  on LUE support and external support. Placing weight through R hand on stedy during standing bouts.     Special needs/care consideration Skin Serous Blisters on arm and back bilaterally. Ecchymosis to the abdomen, Excoriation of Coccyx, Rash on the  lower abdomen, Diabetic management: Pt. On Metformin and sQ Novolog , Special service needs: may need some bariatric equipment but not in bari bed.       Previous Home Environment (from acute therapy documentation) Living Arrangements: Parent  Lives With: Family Available Help at Discharge: Family,Available 24 hours/day Type of Home: House Home Layout: One level Home Access: Stairs to enter [employer] of Steps: 4 Bathroom Shower/Tub: Chiropodist: Standard Bathroom Accessibility: Yes How Accessible: Accessible via walker [place]: No   Discharge Living Setting Plans for Discharge Living Setting: Patient's home Type of Home at Discharge: House Discharge Home Layout: One level Discharge Home Access: Stairs to enter [place]: None Entrance Stairs-Number of Steps: 4 Discharge Bathroom Shower/Tub: Tub/shower unit Discharge Bathroom Toilet: Standard Discharge Bathroom Accessibility: Yes How Accessible: Accessible via walker Does the patient have any problems obtaining your medications?: No   Social/Family/Support Systems Patient Roles: Parent Contact Information: [phone] Anticipated Caregiver: Nathanael Krist (mother) Pt. Lives with his mother and she will be home with him. She reports that pt. Has 2 adult siblings who can also help. Anticipated Caregiver's Contact Information: [phone] Ability/Limitations of Caregiver: Can provide min-mod A Caregiver Availability: 24/7 Discharge Plan Discussed with Primary Caregiver: Yes Is Caregiver In Agreement with Plan?: Yes Does Caregiver/Family have Issues with Lodging/Transportation while Pt is in Rehab?: No     Goals Patient/Family Goal for Rehab: PT/OT/SLP Supervision/Min A Expected length of stay: 16-19 days Pt/Family Agrees to Admission and willing to participate: Yes Program Orientation Provided & Reviewed with Pt/Caregiver Including Roles  & Responsibilities: Yes      Decrease burden of Care through IP rehab admission: Specialzed equipment needs, Decrease number of caregivers, Bowel and bladder program and Patient/family education     Possible need for SNF placement upon discharge:not anticiapted     Patient Condition: This patient's medical and functional status has changed since the consult dated: 08/05/20 in which the Rehabilitation Physician determined and documented that the patient's condition is appropriate for intensive rehabilitative care in an inpatient rehabilitation facility. See "History of Present Illness" (above) for medical update. Functional changes are: Pt currently Min-Mod A with bed mobility, Mod A with stedy for transfers and Mod A with upper body ADLs. Patient's medical and functional status update has been discussed with the Rehabilitation physician and patient remains appropriate for inpatient rehabilitation. Will admit to inpatient rehab today.   Preadmission Screen Completed By:  Farley Ly. Staley, with day of admission updates by Bethel Born, CCC-SLP, 08/09/2020 11:42 AM ______________________________________________________________________   Discussed status with Dr. Posey Pronto on 08/09/20  at 11:42 AM and received approval for admission today.   Admission Coordinator: Farley Ly. Staley, with day of admission updates by Bethel Born, time 11:42 AM  Sudie Grumbling 08/09/20

## 2020-08-09 NOTE — Progress Notes (Signed)
Pt arrived to 4W02 per bed. [name] (significant other) at bedside with pt. Assessment complete, vitals obtained. Oriented to rehab and policies discussed.  Mylo Red

## 2020-08-09 NOTE — Progress Notes (Signed)
Physical Therapy Treatment Patient Details Name: Justin Mason MRN: [id number] DOB: June 26, 1981 Today's Date: 08/09/2020    History of Present Illness 40 yo male with PMH of morbid obesity, DM2, HLD, HTN, known COVID-19 infection (dx less than 1 week PTA), history of shoulder surgery with residual R shoulder who presented to ED 1/19 with aphasia and RUE weakness beginning at about 1030 AM 07/24/2020 and progressing until 1230 same day with R facial droop. 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  Outside of window for tpa. Went to IR for revascularization but angioplasty of MCA unsuccessful due to resistance.    PT Comments    Pt making progress with R leg strength, demonstrating activation of his R quads muscle this date. This allows pt to assist with extending his knee with transfers, standing, and weight shifting for gait training. However, secondary to his still significant weakness on his R side he remains at risk for falls, injuries, and shoulder subluxation. While practicing weight shifting and R quad activation to push him to the L while standing in stedy pt had incontinence of bowels and bladder, nurse's assistance notified of this along with clammy appearance of pt. Pt progressing with functional mobility independence from needing modA > minA to roll to the L today and transition to sit EOB and modA to return to supine. Will continue to follow acutely. Current recommendations remain appropriate.  Follow Up Recommendations  CIR;Supervision/Assistance - 24 hour     Equipment Recommendations  Other (comment) (TBD)    Recommendations for Other Services       Precautions / Restrictions Precautions Precautions: Fall Precaution Comments: R inattention, R side flaccid Restrictions Weight Bearing Restrictions: No    Mobility  Bed Mobility Overal bed mobility: Needs Assistance Bed Mobility:  Rolling;Sidelying to Sit;Sit to Supine Rolling: Min assist;Mod assist Sidelying to sit: Min assist;HOB elevated   Sit to supine: Mod assist   General bed mobility comments: Pt able to roll to the L with use of bed rails and AAROM at R arm to bring across body with placement of R foot on bed and cues to push, with minA. Cues to bring knees to chest with minA and to transition onto L shoulder > elbow > hand to ascend trunk with some assistance. Return to supine with modA at legs.  Transfers Overall transfer level: Needs assistance Equipment used: Ambulation equipment used Transfers: Sit to/from Stand Sit to Stand: Mod assist         General transfer comment: pt performed sit>stand 1x from bed to stedy with modA and 3x from stedy with minA cuing pt to focus on maintaining midline on the way up. Tactile cues at R quad to extend.  Ambulation/Gait             General Gait Details: pregait training x2 bouts of ~2-3 min each in stedy with wt shifting with tactile cues and block at R knee and gluts, min guard when standing midline and up to minA when shifting to R. R quad muscle activation noted this date, cuing pt to push with R leg to shift to L, success.   Stairs             Wheelchair Mobility    Modified Rankin (Stroke Patients Only) Modified Rankin (Stroke Patients Only) Pre-Morbid Rankin Score: No symptoms Modified Rankin: Severe disability     Balance Overall balance assessment:  Needs assistance Sitting-balance support: Single extremity supported;Feet unsupported Sitting balance-Leahy Scale: Poor Sitting balance - Comments: 1 UE support with no feet support to sit statically EOB   Standing balance support: Bilateral upper extremity supported;During functional activity Standing balance-Leahy Scale: Poor Standing balance comment: heavy reliance on LUE support and external support. Placing weight through R hand on stedy during standing bouts.                             Cognition Arousal/Alertness: Awake/alert Behavior During Therapy: WFL for tasks assessed/performed Overall Cognitive Status: Difficult to assess Area of Impairment: Awareness;Problem solving;Safety/judgement                         Safety/Judgement: Decreased awareness of safety;Decreased awareness of deficits Awareness: Emergent Problem Solving: Difficulty sequencing;Requires verbal cues;Requires tactile cues General Comments: pt following all commands and nodding appropriately during session, however pt making no effort to verbalize during session. Urine and bowel incontinence in stedy this date with pt acknowledgeing he was unaware of need to use restroom.      Exercises Other Exercises Other Exercises: R quad exercises cuing pt to push through R leg to extend knee to shift weight to L leg standing in stedy, 10x Other Exercises: R shoulder approximation and cues to extend elbow pushing through therapist's hand    General Comments        Pertinent Vitals/Pain Pain Assessment: No/denies pain Pain Intervention(s): Monitored during session    Home Living                      Prior Function            PT Goals (current goals can now be found in the care plan section) Acute Rehab PT Goals Patient Stated Goal: get pt as independent as possible PT Goal Formulation: With patient Time For Goal Achievement: 08/22/20 Potential to Achieve Goals: Good Progress towards PT goals: Progressing toward goals    Frequency    Min 4X/week      PT Plan Current plan remains appropriate    Co-evaluation              AM-PAC PT "6 Clicks" Mobility   Outcome Measure  Help needed turning from your back to your side while in a flat bed without using bedrails?: A Lot Help needed moving from lying on your back to sitting on the side of a flat bed without using bedrails?: A Lot Help needed moving to and from a bed to a chair (including a  wheelchair)?: Total Help needed standing up from a chair using your arms (e.g., wheelchair or bedside chair)?: A Lot Help needed to walk in hospital room?: Total Help needed climbing 3-5 steps with a railing? : Total 6 Click Score: 9    End of Session Equipment Utilized During Treatment: Gait belt Activity Tolerance: Patient tolerated treatment well Patient left: with call bell/phone within reach;in bed;with bed alarm set Nurse Communication: Other (comment) (nurse's assistance notified of incontinence and clammy/sweating (she stated she may check his blood glucose levels)) PT Visit Diagnosis: Hemiplegia and hemiparesis;Other abnormalities of gait and mobility (R26.89);Unsteadiness on feet (R26.81);Muscle weakness (generalized) (M62.81);Difficulty in walking, not elsewhere classified (R26.2);Other symptoms and signs involving the nervous system (R29.898) Hemiplegia - Right/Left: Right Hemiplegia - dominant/non-dominant: Dominant Hemiplegia - caused by: Cerebral infarction     Time: 0910-1009 PT Time Calculation (min) (ACUTE ONLY):  59 min  Charges:  $Gait Training: 8-22 mins $Therapeutic Activity: 8-22 mins $Neuromuscular Re-education: 23-37 mins                     Raymond Gurney, PT, DPT Acute Rehabilitation Services  Pager: [phone] Office: [phone]    Jewel Baize 08/09/2020, 10:25 AM

## 2020-08-09 NOTE — Progress Notes (Signed)
Inpatient Rehabilitation Medication Review by a Pharmacist  A complete drug regimen review was completed for this patient to identify any potential clinically significant medication issues.  Clinically significant medication issues were identified:  No.  Check AMION for pharmacist assigned to patient if future medication questions/issues arise during this admission.  Time spent performing this drug regimen review (minutes):  8 min.   Legrand Pitts 08/09/2020 7:54 PM

## 2020-08-09 NOTE — Progress Notes (Signed)
TEDS placed bilaterally.  Mylo Red, LPN

## 2020-08-09 NOTE — H&P (Signed)
Physical Medicine and Rehabilitation Admission H&P    Chief Complaint  Patient presents with  . Functional deficits due to stroke   HPI:  Justin Mason is a 40 year old male with history of HTN--poorly controlled (Dr. Tomie China), Morbid obesity, T2DM, OSA, diagnosis of Covid 19 less than one week prior to admission on 07/24/2020 with dysarthria, right hemiparesis, progressing to right facial droop with inability to speak.  History taken from chart review and girlfriend due to aphasia.  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  CT perfusion showed large area of hypoperfusion without core infarct. CT angioplasty of MC attempted without success due to significant resistance and suspected to be chronic.  He tolerated extubation without difficulty and respiratory status has been stable--therefore no indications to treat Covid.  Echocardiogram with ejection fraction of 60-65%.  Dr. Pearlean Brownie recommended DAPT x3 months followed by aspirin alone.  Bedside swallow showed mild oropharyngeal dysphagia and patient placed on D3 thin liquid diet.  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  CIR was recommended due to functional decline.  Please see preadmission assessment from earlier today as well.  Review of Systems  Unable to perform ROS: Patient nonverbal   Past Medical History:  Diagnosis Date  . Arthritis   . Cardiac murmur 05/24/2020  . Cellulitis 01/03/2015  . Cellulitis of right leg   . Cellulitis of right lower extremity 01/03/2015  . Essential hypertension   . GERD (gastroesophageal reflux disease)   . High cholesterol   . Hyperlipemia   . Hypertension   . Morbid obesity (HCC) 05/24/2020  . OSA (obstructive sleep apnea)   . Rotator cuff tear, right 09/08/2011  . Sepsis (HCC) 01/03/2015  . Type 2 diabetes mellitus with hyperglycemia [facility])      Past Surgical History:  Procedure Laterality Date  . INCISION AND DRAINAGE FOOT  1991-1992   STEPPED ON NAIL / INFECTED WOUND  . IR ANGIO INTRA EXTRACRAN SEL COM CAROTID INNOMINATE BILAT MOD SED  07/24/2020  . IR ANGIO VERTEBRAL SEL SUBCLAVIAN INNOMINATE UNI L MOD SED  07/24/2020  . IR ANGIO VERTEBRAL SEL VERTEBRAL UNI R MOD SED  07/24/2020  . RADIOLOGY WITH ANESTHESIA N/A 07/24/2020   Procedure: IR WITH ANESTHESIA - CODE STROKE;  Surgeon: Radiologist, Medication, MD;  Location: [facility] OR;  Service: Radiology;  Laterality: N/A;  . SHOULDER SURGERY     September 08, 2011   Family History  Problem Relation Age of Onset  . Anesthesia problems Sister   . Prostate cancer Other   . Colon cancer Other     Social History:  Lives with mother. 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 Has a supportive girlfriend. Per  reports that he has never smoked. His smokeless tobacco use includes snuff. Per reports current alcohol use of about 7.0 standard drinks of beer per week. Per reports he does not use drugs.    Allergies: No Known Allergies    Medications Prior to Admission  Medication Sig Dispense Refill  . allopurinol (ZYLOPRIM) 100 MG tablet Take 100 mg by mouth as needed (gout).    . Ascorbic Acid (VITAMIN C PO) Take 1 tablet by mouth every evening.    . Cholecalciferol (VITAMIN D-3) 25 MCG (1000 UT) CAPS Take 1,000 Units by mouth every evening.    . dapagliflozin propanediol (FARXIGA) 10 MG TABS tablet Take 10  mg by mouth daily.    Marland Kitchen esomeprazole (NEXIUM) 20 MG capsule Take 20 mg by mouth daily.    . meloxicam (MOBIC) 15 MG tablet Take 15 mg by mouth daily.    . metFORMIN (GLUCOPHAGE) 1000 MG tablet Take 1,000 mg by mouth 2 (two) times daily.    . metoprolol succinate (TOPROL-XL) 100 MG 24 hr tablet Take 100 mg by mouth 2 (two) times daily.    Marland Kitchen olmesartan-hydrochlorothiazide (BENICAR HCT) 40-25 MG tablet Take 1 tablet by mouth daily.    . Zinc 50 MG TABS Take 200 mg by mouth every  evening.    Marland Kitchen amLODipine (NORVASC) 10 MG tablet Take 1 tablet (10 mg total) by mouth daily. (Patient not taking: No sig reported) 30 tablet 12  . cloNIDine (CATAPRES) 0.1 MG tablet Take 1 tablet (0.1 mg total) by mouth 2 (two) times daily. (Patient not taking: No sig reported) 60 tablet 6    Drug Regimen Review  Drug regimen was reviewed and remains appropriate with no significant issues identified  Home: Home Living Family/patient expects to be discharged to:: Private residence Living Arrangements: Parent Available Help at Discharge: Family,Available 24 hours/day Type of Home: House Home Access: Stairs to enter [employer] of Steps: 4 Home Layout: One level Bathroom Shower/Tub: Associate Professor: Yes Home Equipment: None  Lives With: Family   Functional History: Prior Function Level of Independence: Independent Comments: Pt is a Landscape architect (heavy lifting, pipe carrying, HVAC unit repair). Information obtained from calling pt's mother, [name]  Functional Status:  Mobility: Bed Mobility Overal bed mobility: Needs Assistance Bed Mobility: Rolling,Sidelying to Sit,Sit to Supine Rolling: Min assist,Mod assist Sidelying to sit: Min assist,HOB elevated Supine to sit: Mod assist,HOB elevated,+2 for physical assistance Sit to supine: Mod assist General bed mobility comments: Pt able to roll to the L with use of bed rails and AAROM at R arm to bring across body with placement of R foot on bed and cues to push, with minA. Cues to bring knees to chest with minA and to transition onto L shoulder > elbow > hand to ascend trunk with some assistance. Return to supine with modA at legs. Transfers Overall transfer level: Needs assistance Equipment used: Ambulation equipment used Transfer via Lift Equipment: Stedy Transfers: Sit to/from Stand Sit to Stand: Mod assist Stand pivot transfers: Total assist (sara stedy)   Lateral/Scoot Transfers: Mod assist,+2 physical assistance General transfer comment: pt performed sit>stand 1x from bed to stedy with modA and 3x from stedy with minA cuing pt to focus on maintaining midline on the way up. Tactile cues at R quad to extend. Ambulation/Gait General Gait Details: pregait training x2 bouts of ~2-3 min each in stedy with wt shifting with tactile cues and block at R knee and gluts, min guard when standing midline and up to minA when shifting to R. R quad muscle activation noted this date, cuing pt to push with R leg to shift to L, success.    ADL: ADL Overall ADL's : Needs assistance/impaired Eating/Feeding: Set up,Sitting Eating/Feeding Details (indicate cue type and reason): able to bring cup to mouth with LUE wit set- up assist Grooming: Wash/dry hands,Applying deodorant,Minimal assistance,Sitting Grooming Details (indicate cue type and reason): Patient able to wash face seated in recliner with set-up assist. Would likely require Min A for oral hygiene 2/2 flaccid paralysis in RUE. Upper Body Bathing: Moderate assistance,Sitting (progressing to Min A) Upper Body Bathing Details (indicate cue type  and reason): Patient able to wash RUE, chest and abdomen. Cues for hemi technique to wash LUE seated in recliner. Lower Body Bathing: Total assistance,Sit to/from stand Lower Body Bathing Details (indicate cue type and reason): total A for posterior pericare d/t incontinent bladder Upper Body Dressing : Moderate assistance,Sitting Upper Body Dressing Details (indicate cue type and reason): Education on hemi technique and Mod A to don anterior hospital gown. Lower Body Dressing: Total assistance Toilet Transfer: Moderate assistance,+2 for physical assistance,Total assistance Toilet Transfer Details (indicate cue type and reason): MOD A +2 to stand to stedy, total A with stedy to pivot to recliner Toileting- Clothing Manipulation and Hygiene: Total assistance,Sit to/from  stand Toileting - Clothing Manipulation Details (indicate cue type and reason): Total A for hygiene management in standing with use of Stedy. Functional mobility during ADLs: Moderate assistance,+2 for physical assistance (sit<>stand to stedy) General ADL Comments: pt continues to make excellent progress able to complete seated grooming tasks using RUE as stabilizer needing total A hand over hand assist to maintain functional grasp on toothbrush. pt completed multiple sit<>stands from stedy with MOD A +2. pt continues to present with impaired balance, however utilized mirror for visual feedback to assist with maintaining balance. pt with good carryover  Cognition: Cognition Overall Cognitive Status: Difficult to assess Arousal/Alertness: Awake/alert Orientation Level: Oriented to person,Oriented to place,Oriented to situation Attention: Sustained Sustained Attention: Impaired Memory:  (will asssess as able) Awareness: Impaired Awareness Impairment: Emergent impairment Problem Solving: Impaired Behaviors: Lability Safety/Judgment: Impaired Cognition Arousal/Alertness: Awake/alert Behavior During Therapy: WFL for tasks assessed/performed Overall Cognitive Status: Difficult to assess Area of Impairment: Awareness,Problem solving,Safety/judgement Orientation Level: Disoriented to,Place Following Commands: Follows one step commands with increased time,Follows one step commands consistently Safety/Judgement: Decreased awareness of safety,Decreased awareness of deficits Awareness: Emergent Problem Solving: Difficulty sequencing,Requires verbal cues,Requires tactile cues General Comments: pt following all commands and nodding appropriately during session, however pt making no effort to verbalize during session. Urine and bowel incontinence in stedy this date with pt acknowledgeing he was unaware of need to use restroom. Difficult to assess due to: Impaired communication (globally aphasic,  receptive improving)   Blood pressure (!) 108/55, pulse 66, temperature 97.7 F (36.5 C), temperature source Oral, resp. rate 18, weight (!) 159.1 kg, SpO2 98 %. Physical Exam Vitals and nursing note reviewed.  Constitutional:      General: He is not in acute distress.    Appearance: He is obese.  HENT:     Head: Normocephalic and atraumatic.     Right Ear: External ear normal.     Left Ear: External ear normal.     Nose: Nose normal.  Eyes:     General:        Right eye: No discharge.        Left eye: No discharge.     Extraocular Movements: Extraocular movements intact.  Cardiovascular:     Rate and Rhythm: Normal rate and regular rhythm.  Pulmonary:     Effort: Pulmonary effort is normal. No respiratory distress.     Breath sounds: No stridor.  Abdominal:     General: Abdomen is flat. Bowel sounds are normal. There is no distension.  Musculoskeletal:        General: Swelling (1+ edema right foot and right hand) present. No tenderness.     Cervical back: Normal range of motion.  Skin:    General: Skin is warm.     Comments: Erythema bilateral plantar feet  Neurological:  Mental Status: He is alert.     Comments: Non verbal. Expressive aphasia  Able to point to items in room but tends to do thumbs up to most questions.  Unable to make any sounds.  Motor: RUE/RLE: 0/5 proximal distal No increase in tone noted LUE/LLE: 5/5 proximal to distal  Psychiatric:     Comments: Unable to assess due to aphasia     Results for orders placed or performed during the hospital encounter of 07/24/20 (from the past 48 hour(s))  Glucose, capillary     Status: Abnormal   Collection Time: 08/07/20  4:36 PM  Result Value Ref Range   Glucose-Capillary 169 (H) 70 - 99 mg/dL    Comment: Glucose reference range applies only to samples taken after fasting for at least 8 hours.  Glucose, capillary     Status: Abnormal   Collection Time: 08/07/20  9:24 PM  Result Value Ref Range    Glucose-Capillary 146 (H) 70 - 99 mg/dL    Comment: Glucose reference range applies only to samples taken after fasting for at least 8 hours.  Magnesium     Status: None   Collection Time: 08/08/20 12:53 AM  Result Value Ref Range   Magnesium 1.9 1.7 - 2.4 mg/dL    Comment: Performed at [facility], [address] [place], [place] [address]  Potassium     Status: None   Collection Time: 08/08/20 12:53 AM  Result Value Ref Range   Potassium 4.0 3.5 - 5.1 mmol/L    Comment: Performed at [facility], [address], [place], [place] [address]  Glucose, capillary     Status: Abnormal   Collection Time: 08/08/20  6:20 AM  Result Value Ref Range   Glucose-Capillary 147 (H) 70 - 99 mg/dL    Comment: Glucose reference range applies only to samples taken after fasting for at least 8 hours.  Glucose, capillary     Status: Abnormal   Collection Time: 08/08/20  8:33 AM  Result Value Ref Range   Glucose-Capillary 175 (H) 70 - 99 mg/dL    Comment: Glucose reference range applies only to samples taken after fasting for at least 8 hours.  Glucose, capillary     Status: Abnormal   Collection Time: 08/08/20 11:45 AM  Result Value Ref Range   Glucose-Capillary 138 (H) 70 - 99 mg/dL    Comment: Glucose reference range applies only to samples taken after fasting for at least 8 hours.  Glucose, capillary     Status: Abnormal   Collection Time: 08/08/20  3:50 PM  Result Value Ref Range   Glucose-Capillary 137 (H) 70 - 99 mg/dL    Comment: Glucose reference range applies only to samples taken after fasting for at least 8 hours.  Glucose, capillary     Status: Abnormal   Collection Time: 08/08/20  9:26 PM  Result Value Ref Range   Glucose-Capillary 161 (H) 70 - 99 mg/dL    Comment: Glucose reference range applies only to samples taken after fasting for at least 8 hours.  Glucose, capillary     Status: Abnormal   Collection Time: 08/09/20  6:12 AM  Result Value Ref Range    Glucose-Capillary 167 (H) 70 - 99 mg/dL    Comment: Glucose reference range applies only to samples taken after fasting for at least 8 hours.  Glucose, capillary     Status: Abnormal   Collection Time: 08/09/20 11:21 AM  Result Value Ref Range  Glucose-Capillary 173 (H) 70 - 99 mg/dL    Comment: Glucose reference range applies only to samples taken after fasting for at least 8 hours.   Comment 1 Notify RN    Comment 2 Document in Chart    No results found.     Medical Problem List and Plan: 1.  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  -patient may shower  -ELOS/Goals: 15-18 days/min/mod a  Admit to CIR 2.  Antithrombotics: -DVT/anticoagulation:  Pharmaceutical: Lovenox  -antiplatelet therapy: DAPT X 3 months followed by ASA alone--Plavix added on 08/09/20 3. Pain Management: Mobic DC'd  Monitor with increased exertion 4. Mood: LCSW to follow for evaluation and support.   -antipsychotic agents: N/A 5. Neuropsych: This patient is not capable of making decisions on his own behalf. 6. Skin/Wound Care: Routine pressure relief measures.  7. Fluids/Electrolytes/Nutrition: Monitor I/Os.   CMP ordered for tomorrow. 8. HTN: Monitor BP tid--continue Catapres twice daily, HCTZ daily, Avapro, Lopressor bid.   Monitor with increased mobility 9. T2DM with hyperglycemia--controlled: Hgb A1c-6.2. Now on metformin 1000 mg bid--resume [place].   Monitor the increased mobility 10. Covid 19--Positive  on 07/24/20-->no treatment as asymptomatic. Continue zinc and vitamin C.  11. Morbid obesity: BMI 46. Educate on appropriate diet and exercise to help promote health and mobility.   Encourage weight loss 12.  Hyperlipidemia: Crestor 13. H/o gout: continue allopurinol.   Jacquelynn Cree, PA-C  08/09/2020  I have personally performed a face to face diagnostic  evaluation, including, but not limited to relevant history and physical exam findings, of this patient and developed relevant assessment and plan.  Additionally, I have reviewed and concur with the physician assistant's documentation above.  Maryla Morrow, MD, ABPMR  The patient's status has not changed. Any changes from the pre-admission screening or documentation from the acute chart are noted above.   Maryla Morrow, MD, ABPMR

## 2020-08-09 NOTE — TOC Transition Note (Signed)
Transition of Care [facility]) - CM/SW Discharge Note   Patient Details  Name: Justin Mason MRN: [id number] Date of Birth: 1981-05-13  [facility] CM/SW Contact:  Kermit Balo, RN Phone Number: 08/09/2020, 10:45 AM   Clinical Narrative:    Pt is discharging to CIR today. CM signing off.   Final next level of care: IP Rehab Facility Barriers to Discharge: No Barriers Identified   Patient Goals and CMS Choice Patient states their goals for this hospitalization and ongoing recovery are:: Pt unable to participate in goal setting due to disorientation. CMS Medicare.gov Compare Post Acute Care list provided to:: Patient Represenative (must comment) (Father) Choice offered to / list presented to : Parent  Discharge Placement                       Discharge Plan and Services                                     Social Determinants of Health (SDOH) Interventions     Readmission Risk Interventions No flowsheet data found.

## 2020-08-09 NOTE — H&P (Signed)
Physical Medicine and Rehabilitation Admission H&P    Chief Complaint  Patient presents with  . Functional deficits due to stroke   HPI:  Justin Mason is a 40 year old male with history of HTN--poorly controlled (Dr. Tomie Mason), Morbid obesity, T2DM, OSA, diagnosis of Covid 19 less than one week prior to admission on 07/24/2020 with dysarthria, right hemiparesis, progressing to right facial droop with inability to speak.  History taken from chart review and girlfriend due to aphasia.  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  CT perfusion showed large area of hypoperfusion without core infarct. CT angioplasty of MC attempted without success due to significant resistance and suspected to be chronic.  He tolerated extubation without difficulty and respiratory status has been stable--therefore no indications to treat Covid.  Echocardiogram with ejection fraction of 60-65%.  Dr. Pearlean Mason recommended DAPT x3 months followed by aspirin alone.  Bedside swallow showed mild oropharyngeal dysphagia and patient placed on D3 thin liquid diet.  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  CIR was recommended due to functional decline.  Please see preadmission assessment from earlier today as well.  Review of Systems  Unable to perform ROS: Patient nonverbal   Past Medical History:  Diagnosis Date  . Arthritis   . Cardiac murmur 05/24/2020  . Cellulitis 01/03/2015  . Cellulitis of right leg   . Cellulitis of right lower extremity 01/03/2015  . Essential hypertension   . GERD (gastroesophageal reflux disease)   . High cholesterol   . Hyperlipemia   . Hypertension   . Morbid obesity (HCC) 05/24/2020  . OSA (obstructive sleep apnea)   . Rotator cuff tear, right 09/08/2011  . Sepsis (HCC) 01/03/2015  . Type 2 diabetes mellitus with hyperglycemia [facility])      Past Surgical History:  Procedure Laterality Date  . INCISION AND DRAINAGE FOOT  1991-1992   STEPPED ON NAIL / INFECTED WOUND  . IR ANGIO INTRA EXTRACRAN SEL COM CAROTID INNOMINATE BILAT MOD SED  07/24/2020  . IR ANGIO VERTEBRAL SEL SUBCLAVIAN INNOMINATE UNI L MOD SED  07/24/2020  . IR ANGIO VERTEBRAL SEL VERTEBRAL UNI R MOD SED  07/24/2020  . RADIOLOGY WITH ANESTHESIA N/A 07/24/2020   Procedure: IR WITH ANESTHESIA - CODE STROKE;  Surgeon: Radiologist, Medication, MD;  Location: [facility] OR;  Service: Radiology;  Laterality: N/A;  . SHOULDER SURGERY     September 08, 2011   Family History  Problem Relation Age of Onset  . Anesthesia problems Sister   . Prostate cancer Other   . Colon cancer Other     Social History:  Lives with mother. 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 Has a supportive girlfriend. Per  reports that he has never smoked. His smokeless tobacco use includes snuff. Per reports current alcohol use of about 7.0 standard drinks of beer per week. Per reports he does not use drugs.    Allergies: No Known Allergies    Medications Prior to Admission  Medication Sig Dispense Refill  . allopurinol (ZYLOPRIM) 100 MG tablet Take 100 mg by mouth as needed (gout).    . Ascorbic Acid (VITAMIN C PO) Take 1 tablet by mouth every evening.    . Cholecalciferol (VITAMIN D-3) 25 MCG (1000 UT) CAPS Take 1,000 Units by mouth every evening.    . dapagliflozin propanediol (FARXIGA) 10 MG TABS tablet Take 10  mg by mouth daily.    Marland Kitchen esomeprazole (NEXIUM) 20 MG capsule Take 20 mg by mouth daily.    . meloxicam (MOBIC) 15 MG tablet Take 15 mg by mouth daily.    . metFORMIN (GLUCOPHAGE) 1000 MG tablet Take 1,000 mg by mouth 2 (two) times daily.    . metoprolol succinate (TOPROL-XL) 100 MG 24 hr tablet Take 100 mg by mouth 2 (two) times daily.    Marland Kitchen olmesartan-hydrochlorothiazide (BENICAR HCT) 40-25 MG tablet Take 1 tablet by mouth daily.    . Zinc 50 MG TABS Take 200 mg by mouth every  evening.    Marland Kitchen amLODipine (NORVASC) 10 MG tablet Take 1 tablet (10 mg total) by mouth daily. (Patient not taking: No sig reported) 30 tablet 12  . cloNIDine (CATAPRES) 0.1 MG tablet Take 1 tablet (0.1 mg total) by mouth 2 (two) times daily. (Patient not taking: No sig reported) 60 tablet 6    Drug Regimen Review  Drug regimen was reviewed and remains appropriate with no significant issues identified  Home: Home Living Family/patient expects to be discharged to:: Private residence Living Arrangements: Parent Available Help at Discharge: Family,Available 24 hours/day Type of Home: House Home Access: Stairs to enter [employer] of Steps: 4 Home Layout: One level Bathroom Shower/Tub: Associate Professor: Yes Home Equipment: None  Lives With: Family   Functional History: Prior Function Level of Independence: Independent Comments: Pt is a Landscape architect (heavy lifting, pipe carrying, HVAC unit repair). Information obtained from calling pt's mother, Justin Mason  Functional Status:  Mobility: Bed Mobility Overal bed mobility: Needs Assistance Bed Mobility: Rolling,Sidelying to Sit,Sit to Supine Rolling: Min assist,Mod assist Sidelying to sit: Min assist,HOB elevated Supine to sit: Mod assist,HOB elevated,+2 for physical assistance Sit to supine: Mod assist General bed mobility comments: Pt able to roll to the L with use of bed rails and AAROM at R arm to bring across body with placement of R foot on bed and cues to push, with minA. Cues to bring knees to chest with minA and to transition onto L shoulder > elbow > hand to ascend trunk with some assistance. Return to supine with modA at legs. Transfers Overall transfer level: Needs assistance Equipment used: Ambulation equipment used Transfer via Lift Equipment: Stedy Transfers: Sit to/from Stand Sit to Stand: Mod assist Stand pivot transfers: Total assist (sara stedy)   Lateral/Scoot Transfers: Mod assist,+2 physical assistance General transfer comment: pt performed sit>stand 1x from bed to stedy with modA and 3x from stedy with minA cuing pt to focus on maintaining midline on the way up. Tactile cues at R quad to extend. Ambulation/Gait General Gait Details: pregait training x2 bouts of ~2-3 min each in stedy with wt shifting with tactile cues and block at R knee and gluts, min guard when standing midline and up to minA when shifting to R. R quad muscle activation noted this date, cuing pt to push with R leg to shift to L, success.    ADL: ADL Overall ADL's : Needs assistance/impaired Eating/Feeding: Set up,Sitting Eating/Feeding Details (indicate cue type and reason): able to bring cup to mouth with LUE wit set- up assist Grooming: Wash/dry hands,Applying deodorant,Minimal assistance,Sitting Grooming Details (indicate cue type and reason): Patient able to wash face seated in recliner with set-up assist. Would likely require Min A for oral hygiene 2/2 flaccid paralysis in RUE. Upper Body Bathing: Moderate assistance,Sitting (progressing to Min A) Upper Body Bathing Details (indicate cue type  and reason): Patient able to wash RUE, chest and abdomen. Cues for hemi technique to wash LUE seated in recliner. Lower Body Bathing: Total assistance,Sit to/from stand Lower Body Bathing Details (indicate cue type and reason): total A for posterior pericare d/t incontinent bladder Upper Body Dressing : Moderate assistance,Sitting Upper Body Dressing Details (indicate cue type and reason): Education on hemi technique and Mod A to don anterior hospital gown. Lower Body Dressing: Total assistance Toilet Transfer: Moderate assistance,+2 for physical assistance,Total assistance Toilet Transfer Details (indicate cue type and reason): MOD A +2 to stand to stedy, total A with stedy to pivot to recliner Toileting- Clothing Manipulation and Hygiene: Total assistance,Sit to/from  stand Toileting - Clothing Manipulation Details (indicate cue type and reason): Total A for hygiene management in standing with use of Stedy. Functional mobility during ADLs: Moderate assistance,+2 for physical assistance (sit<>stand to stedy) General ADL Comments: pt continues to make excellent progress able to complete seated grooming tasks using RUE as stabilizer needing total A hand over hand assist to maintain functional grasp on toothbrush. pt completed multiple sit<>stands from stedy with MOD A +2. pt continues to present with impaired balance, however utilized mirror for visual feedback to assist with maintaining balance. pt with good carryover  Cognition: Cognition Overall Cognitive Status: Difficult to assess Arousal/Alertness: Awake/alert Orientation Level: Oriented to person,Oriented to place,Oriented to situation Attention: Sustained Sustained Attention: Impaired Memory:  (will asssess as able) Awareness: Impaired Awareness Impairment: Emergent impairment Problem Solving: Impaired Behaviors: Lability Safety/Judgment: Impaired Cognition Arousal/Alertness: Awake/alert Behavior During Therapy: WFL for tasks assessed/performed Overall Cognitive Status: Difficult to assess Area of Impairment: Awareness,Problem solving,Safety/judgement Orientation Level: Disoriented to,Place Following Commands: Follows one step commands with increased time,Follows one step commands consistently Safety/Judgement: Decreased awareness of safety,Decreased awareness of deficits Awareness: Emergent Problem Solving: Difficulty sequencing,Requires verbal cues,Requires tactile cues General Comments: pt following all commands and nodding appropriately during session, however pt making no effort to verbalize during session. Urine and bowel incontinence in stedy this date with pt acknowledgeing he was unaware of need to use restroom. Difficult to assess due to: Impaired communication (globally aphasic,  receptive improving)   Blood pressure (!) 108/55, pulse 66, temperature 97.7 F (36.5 C), temperature source Oral, resp. rate 18, weight (!) 159.1 kg, SpO2 98 %. Physical Exam Vitals and nursing note reviewed.  Constitutional:      General: He is not in acute distress.    Appearance: He is obese.  HENT:     Head: Normocephalic and atraumatic.     Right Ear: External ear normal.     Left Ear: External ear normal.     Nose: Nose normal.  Eyes:     General:        Right eye: No discharge.        Left eye: No discharge.     Extraocular Movements: Extraocular movements intact.  Cardiovascular:     Rate and Rhythm: Normal rate and regular rhythm.  Pulmonary:     Effort: Pulmonary effort is normal. No respiratory distress.     Breath sounds: No stridor.  Abdominal:     General: Abdomen is flat. Bowel sounds are normal. There is no distension.  Musculoskeletal:        General: Swelling (1+ edema right foot and right hand) present. No tenderness.     Cervical back: Normal range of motion.  Skin:    General: Skin is warm.     Comments: Erythema bilateral plantar feet  Neurological:  Mental Status: He is alert.     Comments: Non verbal. Expressive aphasia  Able to point to items in room but tends to do thumbs up to most questions.  Unable to make any sounds.  Motor: RUE/RLE: 0/5 proximal distal No increase in tone noted LUE/LLE: 5/5 proximal to distal  Psychiatric:     Comments: Unable to assess due to aphasia     Results for orders placed or performed during the hospital encounter of 07/24/20 (from the past 48 hour(s))  Glucose, capillary     Status: Abnormal   Collection Time: 08/07/20  4:36 PM  Result Value Ref Range   Glucose-Capillary 169 (H) 70 - 99 mg/dL    Comment: Glucose reference range applies only to samples taken after fasting for at least 8 hours.  Glucose, capillary     Status: Abnormal   Collection Time: 08/07/20  9:24 PM  Result Value Ref Range    Glucose-Capillary 146 (H) 70 - 99 mg/dL    Comment: Glucose reference range applies only to samples taken after fasting for at least 8 hours.  Magnesium     Status: None   Collection Time: 08/08/20 12:53 AM  Result Value Ref Range   Magnesium 1.9 1.7 - 2.4 mg/dL    Comment: Performed at [facility], [address] [place], [place] [address]  Potassium     Status: None   Collection Time: 08/08/20 12:53 AM  Result Value Ref Range   Potassium 4.0 3.5 - 5.1 mmol/L    Comment: Performed at [facility], [address], [place], [place] [address]  Glucose, capillary     Status: Abnormal   Collection Time: 08/08/20  6:20 AM  Result Value Ref Range   Glucose-Capillary 147 (H) 70 - 99 mg/dL    Comment: Glucose reference range applies only to samples taken after fasting for at least 8 hours.  Glucose, capillary     Status: Abnormal   Collection Time: 08/08/20  8:33 AM  Result Value Ref Range   Glucose-Capillary 175 (H) 70 - 99 mg/dL    Comment: Glucose reference range applies only to samples taken after fasting for at least 8 hours.  Glucose, capillary     Status: Abnormal   Collection Time: 08/08/20 11:45 AM  Result Value Ref Range   Glucose-Capillary 138 (H) 70 - 99 mg/dL    Comment: Glucose reference range applies only to samples taken after fasting for at least 8 hours.  Glucose, capillary     Status: Abnormal   Collection Time: 08/08/20  3:50 PM  Result Value Ref Range   Glucose-Capillary 137 (H) 70 - 99 mg/dL    Comment: Glucose reference range applies only to samples taken after fasting for at least 8 hours.  Glucose, capillary     Status: Abnormal   Collection Time: 08/08/20  9:26 PM  Result Value Ref Range   Glucose-Capillary 161 (H) 70 - 99 mg/dL    Comment: Glucose reference range applies only to samples taken after fasting for at least 8 hours.  Glucose, capillary     Status: Abnormal   Collection Time: 08/09/20  6:12 AM  Result Value Ref Range    Glucose-Capillary 167 (H) 70 - 99 mg/dL    Comment: Glucose reference range applies only to samples taken after fasting for at least 8 hours.  Glucose, capillary     Status: Abnormal   Collection Time: 08/09/20 11:21 AM  Result Value Ref Range  Glucose-Capillary 173 (H) 70 - 99 mg/dL    Comment: Glucose reference range applies only to samples taken after fasting for at least 8 hours.   Comment 1 Notify RN    Comment 2 Document in Chart    No results found.     Medical Problem List and Plan: 1.  Right dense hemiplegia with right inattention, severe right lean, iaphasia with apraxia and dysgraphia affecting communication, mobility and ADLs secondary to left M1 infarct.  -patient may shower  -ELOS/Goals: 15-18 days/min/mod a  Admit to CIR 2.  Antithrombotics: -DVT/anticoagulation:  Pharmaceutical: Lovenox  -antiplatelet therapy: DAPT X 3 months followed by ASA alone--Plavix added on 08/09/20 3. Pain Management: Mobic DC'd  Monitor with increased exertion 4. Mood: LCSW to follow for evaluation and support.   -antipsychotic agents: N/A 5. Neuropsych: This patient is not capable of making decisions on his own behalf. 6. Skin/Wound Care: Routine pressure relief measures.  7. Fluids/Electrolytes/Nutrition: Monitor I/Os.   CMP ordered for tomorrow. 8. HTN: Monitor BP tid--continue Catapres twice daily, HCTZ daily, Avapro, Lopressor bid.   Monitor with increased mobility 9. T2DM with hyperglycemia--controlled: Hgb A1c-6.2. Now on metformin 1000 mg bid--resume [place].   Monitor the increased mobility 10. Covid 19--Positive  on 07/24/20-->no treatment as asymptomatic. Continue zinc and vitamin C.  11. Morbid obesity: BMI 46. Educate on appropriate diet and exercise to help promote health and mobility.   Encourage weight loss 12.  Hyperlipidemia: Crestor 13. H/o gout: continue allopurinol.   Jacquelynn Cree, PA-C  08/09/2020  I have personally performed a face to face diagnostic  evaluation, including, but not limited to relevant history and physical exam findings, of this patient and developed relevant assessment and plan.  Additionally, I have reviewed and concur with the physician assistant's documentation above.  Maryla Morrow, MD, ABPMR

## 2020-08-10 ENCOUNTER — Inpatient Hospital Stay (HOSPITAL_COMMUNITY): Payer: Managed Care, Other (non HMO)

## 2020-08-10 ENCOUNTER — Encounter (HOSPITAL_COMMUNITY): Payer: Self-pay | Admitting: Physical Medicine & Rehabilitation

## 2020-08-10 DIAGNOSIS — E1165 Type 2 diabetes mellitus with hyperglycemia: Secondary | ICD-10-CM

## 2020-08-10 DIAGNOSIS — R7401 Elevation of levels of liver transaminase levels: Secondary | ICD-10-CM

## 2020-08-10 DIAGNOSIS — I639 Cerebral infarction, unspecified: Secondary | ICD-10-CM

## 2020-08-10 DIAGNOSIS — E871 Hypo-osmolality and hyponatremia: Secondary | ICD-10-CM

## 2020-08-10 DIAGNOSIS — M7989 Other specified soft tissue disorders: Secondary | ICD-10-CM

## 2020-08-10 DIAGNOSIS — N179 Acute kidney failure, unspecified: Secondary | ICD-10-CM

## 2020-08-10 DIAGNOSIS — I10 Essential (primary) hypertension: Secondary | ICD-10-CM

## 2020-08-10 LAB — CBC WITH DIFFERENTIAL/PLATELET
Abs Immature Granulocytes: 0.07 10*3/uL (ref 0.00–0.07)
Basophils Absolute: 0 10*3/uL (ref 0.0–0.1)
Basophils Relative: 0 %
Eosinophils Absolute: 0.2 10*3/uL (ref 0.0–0.5)
Eosinophils Relative: 3 %
HCT: 40.8 % (ref 39.0–52.0)
Hemoglobin: 14.8 g/dL (ref 13.0–17.0)
Immature Granulocytes: 1 %
Lymphocytes Relative: 35 %
Lymphs Abs: 3.1 10*3/uL (ref 0.7–4.0)
MCH: 31.3 pg (ref 26.0–34.0)
MCHC: 36.3 g/dL — ABNORMAL HIGH (ref 30.0–36.0)
MCV: 86.3 fL (ref 80.0–100.0)
Monocytes Absolute: 0.9 10*3/uL (ref 0.1–1.0)
Monocytes Relative: 10 %
Neutro Abs: 4.6 10*3/uL (ref 1.7–7.7)
Neutrophils Relative %: 51 %
Platelets: 213 10*3/uL (ref 150–400)
RBC: 4.73 MIL/uL (ref 4.22–5.81)
RDW: 12.8 % (ref 11.5–15.5)
WBC: 9 10*3/uL (ref 4.0–10.5)
nRBC: 0 % (ref 0.0–0.2)

## 2020-08-10 LAB — GLUCOSE, CAPILLARY
Glucose-Capillary: 175 mg/dL — ABNORMAL HIGH (ref 70–99)
Glucose-Capillary: 160 mg/dL — ABNORMAL HIGH (ref 70–99)
Glucose-Capillary: 193 mg/dL — ABNORMAL HIGH (ref 70–99)
Glucose-Capillary: 163 mg/dL — ABNORMAL HIGH (ref 70–99)

## 2020-08-10 LAB — COMPREHENSIVE METABOLIC PANEL
ALT: 61 U/L — ABNORMAL HIGH (ref 0–44)
AST: 29 U/L (ref 15–41)
Albumin: 3.6 g/dL (ref 3.5–5.0)
Alkaline Phosphatase: 51 U/L (ref 38–126)
Anion gap: 12 (ref 5–15)
BUN: 35 mg/dL — ABNORMAL HIGH (ref 6–20)
CO2: 24 mmol/L (ref 22–32)
Calcium: 9.8 mg/dL (ref 8.9–10.3)
Chloride: 98 mmol/L (ref 98–111)
Creatinine, Ser: 1.33 mg/dL — ABNORMAL HIGH (ref 0.61–1.24)
GFR, Estimated: >60 mL/min (ref >60)
Glucose, Bld: 152 mg/dL — ABNORMAL HIGH (ref 70–99)
Potassium: 4.2 mmol/L (ref 3.5–5.1)
Sodium: 134 mmol/L — ABNORMAL LOW (ref 135–145)
Total Bilirubin: 1.3 mg/dL — ABNORMAL HIGH (ref 0.3–1.2)
Total Protein: 7.1 g/dL (ref 6.5–8.1)

## 2020-08-10 MED ORDER — JUVEN PO PACK
1.0000 | PACK | Freq: Two times a day (BID) | ORAL | Status: DC
  Administered 2020-08-11 – 2020-09-06 (×48): 1 via ORAL
  Filled 2020-08-10 (×49): qty 1

## 2020-08-10 NOTE — Progress Notes (Signed)
Initial Nutrition Assessment  DOCUMENTATION CODES:   Morbid obesity  INTERVENTION:  Continue Ensure Enlive po BID, each supplement provides 350 kcal and 20 grams of protein  Continue 30 ml ProSource po BID, each supplement provides 100 kcal and 15 grams protein  MVI with minerals daily  Juven BID, each packet provides 95 calories, 2.5 grams of protein (collagen), and 9.8 grams of carbohydrate (3 grams sugar); also contains 7 grams of L-arginine and L-glutamine, 300 mg vitamin C, 15 mg vitamin E, 1.2 mcg vitamin B-12, 9.5 mg zinc, 200 mg calcium, and 1.5 g  Calcium Beta-hydroxy-Beta-methylbutyrate to support wound healing   NUTRITION DIAGNOSIS:   Increased nutrient needs related to acute illness,wound healing as evidenced by estimated needs.    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 Past medical history significant for morbid obesity, cellulitis of lower extremity, HTN, GERD, HTN, HLD, OSA, and DM2.  NIR unsuccessful angioplasty of MCA and Covid positive on 1/19  RD working remotely.  Unable to contact pt via phone at this time due to noted cognitive, speech and language deficits.  Meal completion 75-85% x 2 documented meals. He is drinking ProSource as well as Ensure Enlive twice daily which provides an additional 900 kcal and 70 grams of protein. Will continue this to help him meet his needs and will add Juven to support wound healing of noted DTI and stage 2 present on admit.   Weight 159.1 kg (350.02 lbs) on 2/4 down 17.4 kg (38 lbs) from 176.5 kg (388.3 lbs) on 1/7;  significant 9.8% in the last month. Highly suspect degree of malnutrition, however unable to identify at this time. Will plan to complete exam at follow-up.  Medications reviewed and include: Vit C, D3, Farxiga, Hydrochlorothiazide, SSI, Metformin, Protonix, Zoloft, Zinc sulfate  Labs: CBGs 160,175,137, Na 134 (L), BUN 35 (H), Cr 1.33 (H) A1c 6.2 on 07/25/20 (well controlled)  NUTRITION - FOCUSED PHYSICAL EXAM:  Unable to complete at this time  Diet Order:   Diet Order            Diet Carb Modified Fluid consistency: Thin; Room service appropriate? No  Diet effective now                 EDUCATION NEEDS:   Not appropriate for education at this time  Skin:  Skin Assessment: Skin Integrity Issues: Skin Integrity Issues:: DTI,Stage II,Other (Comment) DTI: Left;Right buttocks Stage II: coccyx Other: MASD;buttocks  Last BM:  2/3 - type 4  Height:   Ht Readings from Last 1 Encounters:  08/09/20 6\' 1"  (1.854 m)    Weight:   Wt Readings from Last 1 Encounters:  08/09/20 (!) 159.1 kg   BMI:  Body mass index is 46.28 kg/m.  Estimated Nutritional Needs:   Kcal:  2400-2600  Protein:  130-145  Fluid:  >/= 2.4 L    10/07/20, RD, LDN Clinical Nutrition After Hours/Weekend Pager # in Amion

## 2020-08-10 NOTE — Progress Notes (Signed)
[facility] PHYSICAL MEDICINE & REHABILITATION PROGRESS NOTE  Subjective/Complaints: Patient seen laying in bed this AM.  No reported issues overnight.   ROS: Unable to obtain due to aphasia  Objective: Vital Signs: Blood pressure (!) 151/89, pulse 77, temperature 98.9 F (37.2 C), temperature source Oral, resp. rate 20, height 6\' 1"  (1.854 m), weight (!) 159.1 kg, SpO2 98 %. VAS [place] LOWER EXTREMITY VENOUS (DVT)  Result Date: 08/10/2020  Lower Venous DVT Study Indications: Swelling.  Risk Factors: Surgery Back. Comparison Study: Prev 2016 negative Performing Technologist: Clint Guy RVT  Examination Guidelines: A complete evaluation includes B-mode imaging, spectral Doppler, color Doppler, and power Doppler as needed of all accessible portions of each vessel. Bilateral testing is considered an integral part of a complete examination. Limited examinations for reoccurring indications may be performed as noted. The reflux portion of the exam is performed with the patient in reverse Trendelenburg.  +---------+---------------+---------+-----------+----------+--------------+ RIGHT    CompressibilityPhasicitySpontaneityPropertiesThrombus Aging +---------+---------------+---------+-----------+----------+--------------+ CFV      Full           Yes      Yes                                 +---------+---------------+---------+-----------+----------+--------------+ SFJ      Full                                                        +---------+---------------+---------+-----------+----------+--------------+ FV Prox  Full                                                        +---------+---------------+---------+-----------+----------+--------------+ FV Mid   Full                                                        +---------+---------------+---------+-----------+----------+--------------+ FV DistalFull                                                         +---------+---------------+---------+-----------+----------+--------------+ PFV      Full                                                        +---------+---------------+---------+-----------+----------+--------------+ POP      Full           Yes      Yes                                 +---------+---------------+---------+-----------+----------+--------------+ PTV      Full                                                        +---------+---------------+---------+-----------+----------+--------------+  PERO     Full                                                        +---------+---------------+---------+-----------+----------+--------------+   +---------+---------------+---------+-----------+----------+--------------+ LEFT     CompressibilityPhasicitySpontaneityPropertiesThrombus Aging +---------+---------------+---------+-----------+----------+--------------+ CFV      Full           Yes      Yes                                 +---------+---------------+---------+-----------+----------+--------------+ SFJ      Full                                                        +---------+---------------+---------+-----------+----------+--------------+ FV Prox  Full                                                        +---------+---------------+---------+-----------+----------+--------------+ FV Mid   Full                                                        +---------+---------------+---------+-----------+----------+--------------+ FV DistalFull                                                        +---------+---------------+---------+-----------+----------+--------------+ PFV      Full                                                        +---------+---------------+---------+-----------+----------+--------------+ POP      Full           Yes      Yes                                  +---------+---------------+---------+-----------+----------+--------------+ PTV      Full                                                        +---------+---------------+---------+-----------+----------+--------------+ PERO     Full                                                        +---------+---------------+---------+-----------+----------+--------------+  Summary: BILATERAL: - No evidence of deep vein thrombosis seen in the lower extremities, bilaterally. -No evidence of popliteal cyst, bilaterally.   *See table(s) above for measurements and observations. Electronically signed by Sherald Hess MD on 08/10/2020 at 12:36:13 PM.    Final    Recent Labs    08/10/20 0625  WBC 9.0  HGB 14.8  HCT 40.8  PLT 213   Recent Labs    08/08/20 0053 08/10/20 0625  NA  --  134*  K 4.0 4.2  CL  --  98  CO2  --  24  GLUCOSE  --  152*  BUN  --  35*  CREATININE  --  1.33*  CALCIUM  --  9.8   No intake or output data in the 24 hours ending 08/10/20 1313   Pressure Injury 08/09/20 Coccyx Mid Stage 2 -  Partial thickness loss of dermis presenting as a shallow open injury with a red, pink wound bed without slough. (Active)  08/09/20 1555  Location: Coccyx  Location Orientation: Mid  Staging: Stage 2 -  Partial thickness loss of dermis presenting as a shallow open injury with a red, pink wound bed without slough.  Wound Description (Comments):   Present on Admission: Yes     Pressure Injury 08/09/20 Buttocks Left;Right Deep Tissue Pressure Injury - Purple or maroon localized area of discolored intact skin or blood-filled blister due to damage of underlying soft tissue from pressure and/or shear. (Active)  08/09/20 1716  Location: Buttocks  Location Orientation: Left;Right  Staging: Deep Tissue Pressure Injury - Purple or maroon localized area of discolored intact skin or blood-filled blister due to damage of underlying soft tissue from pressure and/or shear.  Wound Description  (Comments):   Present on Admission: Yes    Physical Exam: BP (!) 151/89   Pulse 77   Temp 98.9 F (37.2 C) (Oral)   Resp 20   Ht 6\' 1"  (1.854 m)   Wt (!) 159.1 kg   SpO2 98%   BMI 46.28 kg/m  Constitutional: No distress . Vital signs reviewed. Morbidly obese.  HENT: Normocephalic.  Atraumatic. Eyes: EOMI. No discharge. Cardiovascular: No JVD.  RRR. Respiratory: Normal effort.  No stridor.  Bilateral clear to auscultation. GI: Non-distended.  BS +. Skin: Warm and dry.  Intact. Psych: Unable to obtain due to cognition Musc: RUE/RLE edema No tenderness in extremities. Neuro: Alert Expressive aphasia Nonverbal Motor: RUE/RLE: 0/5 proximal distal, unchanged No increase in tone noted LUE/LLE: 5/5 proximal to distal   Assessment/Plan: 1. Functional deficits which require 3+ hours per day of interdisciplinary therapy in a comprehensive inpatient rehab setting.  Physiatrist is providing close team supervision and 24 hour management of active medical problems listed below.  Physiatrist and rehab team continue to assess barriers to discharge/monitor patient progress toward functional and medical goals   Care Tool:  Bathing    Body parts bathed by patient: Right arm,Chest,Abdomen,Front perineal area,Right upper leg,Left upper leg,Face         Bathing assist Assist Level: 2 Helpers     Upper Body Dressing/Undressing Upper body dressing   What is the patient wearing?: Hospital gown only    Upper body assist Assist Level: Total Assistance - Patient < 25%    Lower Body Dressing/Undressing Lower body dressing      What is the patient wearing?: Incontinence brief     Lower body assist Assist for lower body dressing: Dependent - Patient 0%     Toileting Toileting  Toileting assist Assist for toileting: 2 Helpers Assistive Device Comment: Urinal/bedpan   Transfers Chair/bed transfer  Transfers assist           Locomotion Ambulation   Ambulation  assist   Ambulation activity did not occur: Safety/medical concerns          Walk 10 feet activity   Assist  Walk 10 feet activity did not occur: Safety/medical concerns        Walk 50 feet activity   Assist Walk 50 feet with 2 turns activity did not occur: Safety/medical concerns         Walk 150 feet activity   Assist Walk 150 feet activity did not occur: Safety/medical concerns         Walk 10 feet on uneven surface  activity   Assist Walk 10 feet on uneven surfaces activity did not occur: Safety/medical concerns         Wheelchair     Assist               Wheelchair 50 feet with 2 turns activity    Assist            Wheelchair 150 feet activity     Assist           Medical Problem List and Plan: 1.  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  Begin CIR evaluations 2.  Antithrombotics: -DVT/anticoagulation:  Pharmaceutical: Lovenox             -antiplatelet therapy: DAPT X 3 months followed by ASA alone--Plavix added on 08/09/20 3. Pain Management: Mobic DC'd             Monitor with increased exertion 4. Mood: LCSW to follow for evaluation and support.              -antipsychotic agents: N/A 5. Neuropsych: This patient is not capable of making decisions on his own behalf. 6. Skin/Wound Care: Routine pressure relief measures.  7. Fluids/Electrolytes/Nutrition: Monitor I/Os.  8. HTN: Monitor BP tid--continue Catapres twice daily, HCTZ daily, Avapro, Lopressor bid.              Monitor with increased mobility, labile on 2/5 9. T2DM with hyperglycemia--controlled: Hgb A1c-6.2. Now on metformin 1000 mg bid--resume [place].              Monitor with increased mobility 10. Covid 19--Positive  on 07/24/20-->no treatment as asymptomatic. Continue zinc and vitamin C.  11. Morbid obesity: BMI 46.  Educate on appropriate diet and exercise to help promote health and mobility.              Encourage weight loss 12.  Hyperlipidemia: Crestor 13. H/o gout: continue allopurinol.  14. Hyponatremia  Na+ 134 on 2/5  Continue to monitor 15. AKI  Cr. 1.33 on 2/5, labs ordered for Monday  Encourage fluids 16. Transaminitis  ALT elevated on 2/5, continue to monitor   LOS: 1 days A FACE TO FACE EVALUATION WAS PERFORMED  Anetta Olvera Karis Juba 08/10/2020, 1:13 PM

## 2020-08-10 NOTE — Plan of Care (Signed)
  Problem: RH Balance Goal: LTG Patient will maintain dynamic sitting balance (PT) Description: LTG:  Patient will maintain dynamic sitting balance with assistance during mobility activities (PT) Flowsheets (Taken 08/10/2020 1550) LTG: Pt will maintain dynamic sitting balance during mobility activities with:: Supervision/Verbal cueing Goal: LTG Patient will maintain dynamic standing balance (PT) Description: LTG:  Patient will maintain dynamic standing balance with assistance during mobility activities (PT) Flowsheets (Taken 08/10/2020 1550) LTG: Pt will maintain dynamic standing balance during mobility activities with:: Moderate Assistance - Patient 50 - 74%   Problem: Sit to Stand Goal: LTG:  Patient will perform sit to stand with assistance level (PT) Description: LTG:  Patient will perform sit to stand with assistance level (PT) Flowsheets (Taken 08/10/2020 1550) LTG: PT will perform sit to stand in preparation for functional mobility with assistance level: Moderate Assistance - Patient 50 - 74%   Problem: RH Bed Mobility Goal: LTG Patient will perform bed mobility with assist (PT) Description: LTG: Patient will perform bed mobility with assistance, with/without cues (PT). Flowsheets (Taken 08/10/2020 1550) LTG: Pt will perform bed mobility with assistance level of: Minimal Assistance - Patient > 75%   Problem: RH Bed to Chair Transfers Goal: LTG Patient will perform bed/chair transfers w/assist (PT) Description: LTG: Patient will perform bed to chair transfers with assistance (PT). Flowsheets (Taken 08/10/2020 1550) LTG: Pt will perform Bed to Chair Transfers with assistance level: (with LRAD) Minimal Assistance - Patient > 75%   Problem: RH Car Transfers Goal: LTG Patient will perform car transfers with assist (PT) Description: LTG: Patient will perform car transfers with assistance (PT). Flowsheets (Taken 08/10/2020 1550) LTG: Pt will perform car transfers with assist:: Moderate Assistance  - Patient 50 - 74%   Problem: RH Ambulation Goal: LTG Patient will ambulate in controlled environment (PT) Description: LTG: Patient will ambulate in a controlled environment, # of feet with assistance (PT). Flowsheets (Taken 08/10/2020 1550) LTG: Pt will ambulate in controlled environ  assist needed:: Moderate Assistance - Patient 50 - 74% LTG: Ambulation distance in controlled environment: 15ft with PT only to force use of RLE Goal: LTG Patient will ambulate in home environment (PT) Description: LTG: Patient will ambulate in home environment, # of feet with assistance (PT). Flowsheets (Taken 08/10/2020 1550) LTG: Ambulation distance in home environment: TBD   Problem: RH Wheelchair Mobility Goal: LTG Patient will propel w/c in controlled environment (PT) Description: LTG: Patient will propel wheelchair in controlled environment, # of feet with assist (PT) Flowsheets (Taken 08/10/2020 1550) LTG: Pt will propel w/c in controlled environ  assist needed:: Supervision/Verbal cueing LTG: Propel w/c distance in controlled environment: 180ft   Problem: RH Stairs Goal: LTG Patient will ambulate up and down stairs w/assist (PT) Description: LTG: Patient will ambulate up and down # of stairs with assistance (PT) Flowsheets (Taken 08/10/2020 1550) LTG: Pt will  ambulate up and down number of stairs: TBD

## 2020-08-10 NOTE — Evaluation (Signed)
Speech Language Pathology Assessment and Plan  Patient Details  Name: Justin Mason MRN: [id number] Date of Birth: [birth year]  SLP Diagnosis: Aphasia;Cognitive Impairments;Speech and Language deficits;Dysphagia  Rehab Potential: Good ELOS: 2.5-3 weeks   Today's Date: 08/10/2020 SLP Individual Time: 5102-5852 SLP Individual Time Calculation (min): 60 min  Hospital Problem: Principal Problem:   Acute cerebral infarction [facility]) Active Problems:   Pressure injury of skin  Past Medical History:  Past Medical History:  Diagnosis Date  . Arthritis   . Cardiac murmur 05/24/2020  . Cellulitis 01/03/2015  . Cellulitis of right leg   . Cellulitis of right lower extremity 01/03/2015  . Essential hypertension   . GERD (gastroesophageal reflux disease)   . High cholesterol   . Hyperlipemia   . Hypertension   . Morbid obesity ([place]) 05/24/2020  . OSA (obstructive sleep apnea)   . Rotator cuff tear, right 09/08/2011  . Sepsis ([place]) 01/03/2015  . Stroke ([place])   . Type 2 diabetes mellitus with hyperglycemia [facility])    Past Surgical History:  Past Surgical History:  Procedure Laterality Date  . INCISION AND DRAINAGE FOOT  1991-1992   STEPPED ON NAIL / INFECTED WOUND  . IR ANGIO INTRA EXTRACRAN SEL COM CAROTID INNOMINATE BILAT MOD SED  07/24/2020  . IR ANGIO VERTEBRAL SEL SUBCLAVIAN INNOMINATE UNI L MOD SED  07/24/2020  . IR ANGIO VERTEBRAL SEL VERTEBRAL UNI R MOD SED  07/24/2020  . RADIOLOGY WITH ANESTHESIA N/A 07/24/2020   Procedure: IR WITH ANESTHESIA - CODE STROKE;  Surgeon: Radiologist, Medication, MD;  Location: [place];  Service: Radiology;  Laterality: N/A;  . SHOULDER SURGERY     September 08, 2011    Assessment / Plan / Recommendation Clinical Impression Justin Mason a 39 y.o.malewith history of HTN, obesity, T2DM, HTN, recent diagnosis of Covid who was admitted on 07/24/20 with right facial droop, RUE weakness and difficulty talking. CTA head/neck with perfusion showed short segment  occlusion of proximal R-M1/MCA, occlusion of L-ICA terminus and proximal L-M1 segment, moderate stenosis right P2/PCA and diffuse intracranial vasculopathy question severe atherosclerosis v/s vasculitis. CT angioplasty of MCA attempted without success due to significant resistance and suspected to be chronic. He tolerated extubation without difficulty and respiratory status stable--->no indication for Covid Tx. 2D echo showed EF 60-65%. Dr. Leonie Man recommends DAPT x 3 months followed by ASA alone. BSS showed mild oral dysphagia and patient on DIII, thins. Patient with resultant Right hemiplegia with right inattention and severe R-lean, global aphasia and depressed mood affecting ADLs and mobility.Physical Medicine & Rehabilitation was consulted to assess candidacy for CIRgiven functional decline. Off COVID precautions.  Pt presents with severe expressive language impairment and mild receptive language impairment. Administered MS Aphasia screening with patient scoring 0/50 on Expressive subscale and 40/50 on receptive subscale for a total score of 40/100. Pt unable to produce any phonation despite max multimodal cues, pt prefers to utilize thumbs up/down to answer simple yes/no questions. Pt able to approximate oral motor musculature for sounds/articulation (bilabials, fricatives, etc.) however no voicing produced. SLP providing communication board to increase communication of wants/needs, pt demonstrating good understanding and carryover for use. Pt able to follow simple 1 and 2 step directions, more complex directions requiring repetition to follow correctly. Pt able to copy simple words with ~75% accuracy Jenny Reichmann", "Danne Baxter", "water", etc) however writing to dictation <25% accurate.   Pt presents with mild oral dysphagia characterized by extended mastication with regular solids and oral stasis in R buccal area. Pt remains  with significant R sided facial weakness and asymmetry. Pt with emergent awareness of  pocketing in R side, continues to benefit from cues for liquid wash and lingual sweep to clear. Pt with no s/s aspiration with any trials regular solids or thin liquids. Cont with full supervision at meals to increase safety, utilization of compensatory strategies and encourage self-feeding.  Pt will benefit from ongoing cognitive assessment to determine current status when communication/language is more effective.   Skilled Therapeutic Interventions          Pt participating in Bedside Swallow Evaluation, MS Aphasia screening and further non-standardized assessments of speech, language and cognition. Please see above for details.    SLP Assessment  Patient will need skilled Speech Lanaguage Pathology Services during CIR admission    Recommendations  SLP Diet Recommendations: Age appropriate regular solids;Thin Medication Administration: Crushed with puree Supervision: Patient able to self feed;Staff to assist with self feeding;Full supervision/cueing for compensatory strategies Compensations: Slow rate;Minimize environmental distractions;Small sips/bites;Lingual sweep for clearance of pocketing Postural Changes and/or Swallow Maneuvers: Seated upright 90 degrees Oral Care Recommendations: Oral care BID Recommendations for Other Services: Neuropsych consult Patient destination: Home Follow up Recommendations: Outpatient SLP;Home Health SLP Equipment Recommended: To be determined    SLP Frequency 3 to 5 out of 7 days   SLP Duration  SLP Intensity  SLP Treatment/Interventions 2.5-3 weeks  Minumum of 1-2 x/day, 30 to 90 minutes  Cognitive remediation/compensation;Multimodal communication approach;Functional tasks;Therapeutic Activities;Therapeutic Exercise;Oral motor exercises;Dysphagia/aspiration precaution training;Patient/family education    Pain Pain Assessment Pain Scale: 0-10 Pain Score: 0-No pain  Prior Functioning Cognitive/Linguistic Baseline: Within functional  limits Type of Home: House  Lives With: Family Available Help at Discharge: Family;Available 24 hours/day  SLP Evaluation Cognition Overall Cognitive Status: Difficult to assess Arousal/Alertness: Awake/alert Orientation Level: (P) Oriented to person;Oriented to place;Oriented to time Attention: Sustained Sustained Attention: Impaired Awareness: Impaired Awareness Impairment: Emergent impairment Problem Solving: Impaired Behaviors: Lability Safety/Judgment: Impaired  Comprehension Auditory Comprehension Overall Auditory Comprehension: Impaired Yes/No Questions: Impaired Basic Biographical Questions: 76-100% accurate Basic Immediate Environment Questions: 50-74% accurate Commands: Impaired One Step Basic Commands: 75-100% accurate Two Step Basic Commands: 50-74% accurate Multistep Basic Commands: 25-49% accurate Interfering Components: Attention;Processing speed EffectiveTechniques: Repetition;Slowed speech Visual Recognition/Discrimination Discrimination: Not tested Expression Expression Primary Mode of Expression: Nonverbal - gestures Verbal Expression Overall Verbal Expression: Impaired Initiation: Impaired Repetition: Impaired Naming: Impairment Responsive: 0-25% accurate Confrontation: Impaired Convergent: 0-24% accurate Pragmatics: No impairment Non-Verbal Means of Communication: Gestures;Communication board Written Expression Dominant Hand: Right Written Expression: Exceptions to [facility] Copy Ability: Word Dictation Ability: Word Oral Motor Oral Motor/Sensory Function Overall Oral Motor/Sensory Function: Moderate impairment Facial ROM: Reduced right;Suspected CN VII (facial) dysfunction Facial Symmetry: Abnormal symmetry right;Suspected CN VII (facial) dysfunction Facial Strength: Reduced right;Suspected CN VII (facial) dysfunction Lingual ROM: Reduced right;Reduced left;Suspected CN XII (hypoglossal) dysfunction Motor Speech Overall Motor Speech:  Impaired Phonation: Other (comment) (unable to elicit at this time) Motor Planning: Impaired Level of Impairment: Word  Care Tool Care Tool Cognition Expression of Ideas and Wants Expression of Ideas and Wants: Frequent difficulty - frequently exhibits difficulty with expressing needs and ideas   Understanding Verbal and Non-Verbal Content Understanding Verbal and Non-Verbal Content: Usually understands - understands most conversations, but misses some part/intent of message. Requires cues at times to understand   Memory/Recall Ability *first 3 days only Memory/Recall Ability *first 3 days only: That he or she is in a hospital/hospital unit    Bedside Swallowing Assessment General Date of Onset: 07/24/20 Previous  Swallow Assessment: bedside swallow evaluation in acute care Diet Prior to this Study: Regular;Thin liquids Temperature Spikes Noted: No Respiratory Status: Room air History of Recent Intubation: No Behavior/Cognition: Alert;Cooperative;Pleasant mood;Requires cueing Oral Cavity - Dentition: Adequate natural dentition Self-Feeding Abilities: Needs assist Vision: Functional for self-feeding Patient Positioning: Upright in bed Volitional Swallow: Able to elicit  Oral Care Assessment Does patient have any of the following "high(er) risk" factors?: None of the above Does patient have any of the following "at risk" factors?: None of the above Ice Chips Ice chips: Not tested Thin Liquid Thin Liquid: Impaired Presentation: Cup;Straw Oral Phase Impairments: Reduced labial seal Oral Phase Functional Implications: Right anterior spillage Nectar Thick Nectar Thick Liquid: Not tested Honey Thick Honey Thick Liquid: Not tested Puree Puree: Within functional limits Solid Solid: Impaired Presentation: Self Fed Oral Phase Impairments: Reduced lingual movement/coordination Oral Phase Functional Implications: Oral residue;Right lateral sulci pocketing;Prolonged oral transit BSE  Assessment Suspected Esophageal Findings Suspected Esophageal Findings: Other (comment) (hiccups intermittently with and without presence of PO) Risk for Aspiration Impact on safety and function: Mild aspiration risk  Short Term Goals: Week 1: SLP Short Term Goal 1 (Week 1): Pt will follow multistep commands during basic and familiar tasks in 75% of opportunities with min verbal cues SLP Short Term Goal 2 (Week 1): Pt will answer simple-compex yes/no questions with multimodal responses (head nod/shake, thumbs up/down, verbalizations) with 90% accuracy given min A verbal cues SLP Short Term Goal 3 (Week 1): Pt will vocalize on command in 50% of opportunities with Mod A multimodal cues SLP Short Term Goal 4 (Week 1): Pt will consume regular/thin diet with Min A cues to utilize compensatory swallow strategies  Refer to Care Plan for Long Term Goals  Recommendations for other services: None   Discharge Criteria: Patient will be discharged from SLP if patient refuses treatment 3 consecutive times without medical reason, if treatment goals not met, if there is a change in medical status, if patient makes no progress towards goals or if patient is discharged from hospital.  The above assessment, treatment plan, treatment alternatives and goals were discussed and mutually agreed upon: by patient  Dewaine Conger 08/10/2020, 10:47 AM

## 2020-08-10 NOTE — Evaluation (Signed)
Physical Therapy Assessment and Plan  Patient Details  Name: Justin Mason MRN: [id number] Date of Birth: [birth year]  PT Diagnosis: Abnormal posture, Abnormality of gait, Coordination disorder, Hemiplegia dominant, Hypotonia and Muscle weakness Rehab Potential: Fair ELOS: 3.5-4 weeks   Today's Date: 08/10/2020 PT Individual Time: 4765-4650 PT Individual Time Calculation (min): 84 min    Hospital Problem: Principal Problem:   Acute cerebral infarction [facility]) Active Problems:   Pressure injury of skin   Hyponatremia   AKI (acute kidney injury) ([place])   Transaminitis   Benign essential HTN   Past Medical History:  Past Medical History:  Diagnosis Date  . AKI (acute kidney injury) ([place])   . Arthritis   . Cardiac murmur 05/24/2020  . Cellulitis 01/03/2015  . Cellulitis of right leg   . Cellulitis of right lower extremity 01/03/2015  . Essential hypertension   . GERD (gastroesophageal reflux disease)   . High cholesterol   . Hyperlipemia   . Hypertension   . Morbid obesity ([place]) 05/24/2020  . OSA (obstructive sleep apnea)   . Rotator cuff tear, right 09/08/2011  . Sepsis ([place]) 01/03/2015  . Stroke ([place])   . Type 2 diabetes mellitus with hyperglycemia [facility])    Past Surgical History:  Past Surgical History:  Procedure Laterality Date  . INCISION AND DRAINAGE FOOT  1991-1992   STEPPED ON NAIL / INFECTED WOUND  . IR ANGIO INTRA EXTRACRAN SEL COM CAROTID INNOMINATE BILAT MOD SED  07/24/2020  . IR ANGIO VERTEBRAL SEL SUBCLAVIAN INNOMINATE UNI L MOD SED  07/24/2020  . IR ANGIO VERTEBRAL SEL VERTEBRAL UNI R MOD SED  07/24/2020  . RADIOLOGY WITH ANESTHESIA N/A 07/24/2020   Procedure: IR WITH ANESTHESIA - CODE STROKE;  Surgeon: Radiologist, Medication, MD;  Location: [place];  Service: Radiology;  Laterality: N/A;  . SHOULDER SURGERY     September 08, 2011    Assessment & Plan Clinical Impression: Patient is a 40 year old male with history of HTN--poorly controlled (Dr. Geraldo Pitter), Morbid  obesity, T2DM, OSA, diagnosis of Covid 19 less than one week prior to admission on 07/24/2020 with dysarthria, right hemiparesis, progressing to right facial droop with inability to speak.  History taken from chart review and girlfriend due to aphasia.  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  CT perfusion showed large area of hypoperfusion without core infarct. CT angioplasty of MC attempted without success due to significant resistance and suspected to be chronic.  He tolerated extubation without difficulty and respiratory status has been stable--therefore no indications to treat Covid.  Echocardiogram with ejection fraction of 60-65%.  Dr. Leonie Man recommended DAPT x3 months followed by aspirin alone.  Bedside swallow showed mild oropharyngeal dysphagia and patient placed on D3 thin liquid diet.  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  Patient transferred to CIR on 08/09/2020 .   Patient currently requires total with mobility secondary to muscle weakness, muscle joint tightness and muscle paralysis, decreased cardiorespiratoy endurance, abnormal tone and unbalanced muscle activation and decreased sitting balance, decreased standing balance, decreased postural control, hemiplegia and decreased balance strategies.  Prior to hospitalization, patient was independent  with mobility and lived with Family in a House home.  Home access is 4Stairs to enter.  Patient will benefit from skilled PT intervention to maximize safe functional mobility, minimize fall risk and decrease caregiver burden for planned discharge home with 24 hour assist.  Anticipate patient will benefit from follow up [place] at discharge.  PT - End of Session Activity Tolerance: Tolerates < 10 min activity, no significant change in vital signs Endurance Deficit: Yes PT Assessment Rehab Potential  (ACUTE/IP ONLY): Fair PT Barriers to Discharge: [place] home environment;Home environment access/layout;Incontinence;Insurance for SNF coverage;Behavior;Wound Care PT Patient demonstrates impairments in the following area(s): Balance;Behavior;Edema;Endurance;Motor;Perception;Safety;Sensory;Skin Integrity PT Transfers Functional Problem(s): Bed Mobility;Bed to Chair;Car;Furniture;Floor PT Locomotion Functional Problem(s): Ambulation;Wheelchair Mobility;Stairs PT Plan PT Intensity: Minimum of 1-2 x/day ,45 to 90 minutes PT Frequency: 5 out of 7 days PT Duration Estimated Length of Stay: 3.5-4 weeks PT Treatment/Interventions: Ambulation/gait training;Balance/vestibular training;Cognitive remediation/compensation;Community reintegration;Discharge planning;Disease management/prevention;DME/adaptive equipment instruction;Functional electrical stimulation;Functional mobility training;Neuromuscular re-education;Pain management;Patient/family education;Psychosocial support;Skin care/wound management;Splinting/orthotics;Therapeutic Exercise;Stair training;Therapeutic Activities;UE/LE Strength taining/ROM;Visual/perceptual remediation/compensation;UE/LE Coordination activities;Wheelchair propulsion/positioning PT Transfers Anticipated Outcome(s): min-Mod assist PT Locomotion Anticipated Outcome(s): Supervision assist WC mobility. mod assist ambulation with PT only for short distances. PT Recommendation Follow Up Recommendations: Home health PT;Skilled nursing facility Patient destination: Home Equipment Recommended: To be determined;Wheelchair (measurements);Wheelchair cushion (measurements)   PT Evaluation Precautions/Restrictions   fall dense R hemiplegia General   Vital SignsTherapy Vitals Temp: 97.7 F (36.5 C) Temp Source: Oral Pulse Rate: 66 Resp: 17 BP: (!) 94/58 Patient Position (if appropriate): Lying Oxygen Therapy SpO2: 99 % O2 Device: Room Air Pain   denies Home Living/Prior  Functioning Home Living Available Help at Discharge: Family;Available 24 hours/day Type of Home: House Home Access: Stairs to enter [employer] of Steps: 4 Entrance Stairs-Rails: None (plans to install rails) Home Layout: One level Bathroom Shower/Tub: Tub/shower unit  Lives With: Family Prior Function Level of Independence: Independent with basic ADLs;Independent with gait;Independent with transfers;Independent with homemaking with ambulation  Able to Take Stairs?: Yes Driving: Yes Vocation: Full time employment Comments: Pt is a pipe fitter and welder (heavy lifting, pipe carrying, HVAC unit repair). Information obtained from chart review. Vision/Perception     Cognition Overall Cognitive Status: Difficult to assess Arousal/Alertness: Awake/alert Attention: Sustained Sustained Attention: Impaired Awareness: Impaired Awareness Impairment: Emergent impairment Problem Solving: Impaired Behaviors: Other (comment) (flat affect) Safety/Judgment: Impaired Comments: Pt able to nod yes/no to all questions with 90% accuracy Sensation Sensation Light Touch: Impaired Detail Light Touch Impaired Details: Impaired RLE;Impaired RUE Hot/Cold: Appears Intact Proprioception: Impaired Detail Proprioception Impaired Details: Absent RUE;Absent RLE Stereognosis: Not tested Coordination Gross Motor Movements are Fluid and Coordinated: No Fine Motor Movements are Fluid and Coordinated: No Coordination and Movement Description: significant right sided hemi Finger Nose Finger Test: UTA RUE to flaccidity; LUE WNL Heel Shin Test: unable to perform on the R Motor  Motor Motor: Hemiplegia;Abnormal tone Motor - Skilled Clinical Observations: Right UE and LE flaccidity   Trunk/Postural Assessment  Cervical Assessment Cervical Assessment: Within Functional Limits Thoracic Assessment Thoracic Assessment: Exceptions to [facility] (rounded shoulders) Lumbar Assessment Lumbar Assessment:  Exceptions to [facility] (posterior pelvic tilt) Postural Control Postural Control: Deficits on evaluation Righting Reactions: delayed; intermittent right sided lean requiring min assist to correct to midline  Balance Balance Balance Assessed: Yes Static Sitting Balance Static Sitting - Balance Support: Left upper extremity supported Static Sitting - Level of Assistance: 4: Min assist Dynamic Sitting Balance Dynamic Sitting - Balance Support: Left upper extremity supported Dynamic Sitting - Level of Assistance: 3: Mod assist Static Standing Balance Static Standing - Level of Assistance: 2: Max assist;3: Mod assist Static Standing - Comment/# of Minutes: mod assist at rail in hall and min assist in stedy Dynamic Standing Balance Dynamic Standing - Level of Assistance:  1: +1 Total assist;2: Max assist Extremity Assessment      RLE Assessment RLE Assessment: Exceptions to [facility] General Strength Comments: 0/5 proximal to distal. no activement noted with testing or in stance at rail LLE Assessment LLE Assessment: Within Functional Limits General Strength Comments: 5/5 to achieve standing at [place] Bed Mobility Roll left and right activity   Roll left and right assist level: Maximal Assistance - Patient 25 - 49%    Sit to lying activity   Sit to lying assist level: Total Assistance - Patient < 25%    Lying to sitting edge of bed activity   Lying to sitting edge of bed assist level: Total Assistance - Patient < 25%     Care Tool Transfers Sit to stand transfer Sit to stand activity did not occur: Safety/medical concerns      Chair/bed transfer Chair/bed transfer activity did not occur: Safety/medical concerns       Toilet transfer Toilet transfer activity did not occur: Safety/medical concerns      Car transfer          Care Tool Locomotion Ambulation Ambulation activity did not occur: Safety/medical concerns        Walk 10 feet activity Walk 10 feet  activity did not occur: Safety/medical concerns       Walk 50 feet with 2 turns activity Walk 50 feet with 2 turns activity did not occur: Safety/medical concerns      Walk 150 feet activity Walk 150 feet activity did not occur: Safety/medical concerns      Walk 10 feet on uneven surfaces activity Walk 10 feet on uneven surfaces activity did not occur: Safety/medical concerns      Stairs Stair activity did not occur: Safety/medical concerns        Walk up/down 1 step activity Walk up/down 1 step or curb (drop down) activity did not occur: Safety/medical concerns     Walk up/down 4 steps activity did not occuR: Safety/medical concerns  Walk up/down 4 steps activity      Walk up/down 12 steps activity Walk up/down 12 steps activity did not occur: Safety/medical concerns      Pick up small objects from floor Pick up small object from the floor (from standing position) activity did not occur: Safety/medical concerns      Wheelchair Will patient use wheelchair at discharge?: Yes Type of Wheelchair: Manual   Wheelchair assist level: Minimal Assistance - Patient > 75% Max wheelchair distance: 100  Wheel 50 feet with 2 turns activity   Assist Level: Minimal Assistance - Patient > 75%  Wheel 150 feet activity   Assist Level: Moderate Assistance - Patient 50 - 74%    Refer to Care Plan for Long Term Goals  SHORT TERM GOAL WEEK 1 PT Short Term Goal 1 (Week 1): Pt will perform bed mobility with mod assist PT Short Term Goal 2 (Week 1): Pt will trasnfer to and from [facility] with max assist of 1 without stedy PT Short Term Goal 3 (Week 1): Pt will remain in WC >2 hours between therapies  Recommendations for other services: None   Skilled Therapeutic Intervention  Pt received supine in bed and agreeable to PT. Supine>sit transfer with max-total A  And cues for awareness of RUE/RLE. PT instructed patient in PT Evaluation and initiated treatment intervention; see above for results. PT  educated patient in [place], rehab potential, rehab goals, and discharge recommendations along with recommendation for follow-up rehabilitation services.  PT obtained 26inch wide WC. Steady transfer with mod assist from elevated height. Pt transported to rehab gym. Stedy transfer to 24inch wide WC with mod A+2. Sit<>stand at rail in hall x 2 with max assist and +2 for safety. Stepping at rail in hall x 2 ft as listed below with +2 for close WC follow. Severe R knee instability. Sitting balance in WC with supervision assist and RUE supported on arm rest. Pt returned to room and performed steady transfer to bed with max assist. Sit>supine completed with max assist, and left supine in bed with call bell in reach and all needs met.      Mobility Bed Mobility Bed Mobility: Rolling Right;Rolling Left;Sit to Supine;Supine to Sit Rolling Right: Minimal Assistance - Patient > 75% Rolling Left: Maximal Assistance - Patient 25-49% Supine to Sit: Maximal Assistance - Patient - Patient 25-49% Sit to Supine: Maximal Assistance - Patient 25-49% Transfers Transfers: Sit to Stand Sit to Stand: Dependent - mechanical lift;Maximal Assistance - Patient 25-49% Transfer (Assistive device):  (rail in hall) Transfer via [place]: Probation officer Ambulation: Yes Gait Assistance: 2 Helpers Social research officer, government (Feet): 2 Feet Assistive device: Other (Comment) (rail in hall) Gait Assistance Details: Other (comment);Manual facilitation for weight shifting;Manual facilitation for placement;Verbal cues for precautions/safety;Verbal cues for technique Gait Gait: Yes Gait Pattern: Impaired Gait Pattern: Right steppage;Lateral hip instability;Right flexed knee in stance;Decreased stance time - right;Decreased dorsiflexion - right (flaccid RLE) Stairs / Additional Locomotion Stairs: No Architect: Yes Wheelchair Assistance: Minimal assistance - Patient >75% Wheelchair Propulsion: Right  lower extremity;Right upper extremity Wheelchair Parts Management: Needs assistance Distance: 100   Discharge Criteria: Patient will be discharged from PT if patient refuses treatment 3 consecutive times without medical reason, if treatment goals not met, if there is a change in medical status, if patient makes no progress towards goals or if patient is discharged from hospital.  The above assessment, treatment plan, treatment alternatives and goals were discussed and mutually agreed upon: by patient  Lorie Phenix 08/10/2020, 4:42 PM

## 2020-08-10 NOTE — Progress Notes (Signed)
Bilateral lower extremity venous study completed.      Please see CV Proc for preliminary results.   Josephanthony Tindel, RVT  

## 2020-08-10 NOTE — Evaluation (Signed)
Occupational Therapy Assessment and Plan  Patient Details  Name: Justin Mason MRN: [id number] Date of Birth: 1981/03/15  OT Diagnosis: abnormal posture, apraxia, cognitive deficits, disturbance of vision, flaccid hemiplegia and hemiparesis and hemiplegia affecting dominant side Rehab Potential: Rehab Potential (ACUTE ONLY): Good ELOS: 3-4 weeks   Today's Date: 08/10/2020 OT Individual Time: 0800-0900 OT Individual Time Calculation (min): 60 min     Hospital Problem: Principal Problem:   Acute cerebral infarction ([place]) Active Problems:   Pressure injury of skin   Past Medical History:  Past Medical History:  Diagnosis Date  . Arthritis   . Cardiac murmur 05/24/2020  . Cellulitis 01/03/2015  . Cellulitis of right leg   . Cellulitis of right lower extremity 01/03/2015  . Essential hypertension   . GERD (gastroesophageal reflux disease)   . High cholesterol   . Hyperlipemia   . Hypertension   . Morbid obesity ([place]) 05/24/2020  . OSA (obstructive sleep apnea)   . Rotator cuff tear, right 09/08/2011  . Sepsis ([place]) 01/03/2015  . Stroke ([place])   . Type 2 diabetes mellitus with hyperglycemia [facility])    Past Surgical History:  Past Surgical History:  Procedure Laterality Date  . INCISION AND DRAINAGE FOOT  1991-1992   STEPPED ON NAIL / INFECTED WOUND  . IR ANGIO INTRA EXTRACRAN SEL COM CAROTID INNOMINATE BILAT MOD SED  07/24/2020  . IR ANGIO VERTEBRAL SEL SUBCLAVIAN INNOMINATE UNI L MOD SED  07/24/2020  . IR ANGIO VERTEBRAL SEL VERTEBRAL UNI R MOD SED  07/24/2020  . RADIOLOGY WITH ANESTHESIA N/A 07/24/2020   Procedure: IR WITH ANESTHESIA - CODE STROKE;  Surgeon: Radiologist, Medication, MD;  Location: [facility];  Service: Radiology;  Laterality: N/A;  . SHOULDER SURGERY     September 08, 2011    Assessment & Plan Clinical Impression: Justin Mason is a 40 year old male with history of HTN--poorly controlled (Dr. Geraldo Pitter), Morbid obesity, T2DM, OSA, diagnosis of Covid 19 less than one week  prior to admission on 07/24/2020 with dysarthria, right hemiparesis, progressing to right facial droop with inability to speak.  History taken from chart review and girlfriend due to aphasia.  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  CT perfusion showed large area of hypoperfusion without core infarct. CT angioplasty of MC attempted without success due to significant resistance and suspected to be chronic.  He tolerated extubation without difficulty and respiratory status has been stable--therefore no indications to treat Covid.  Echocardiogram with ejection fraction of 60-65%.  Dr. Leonie Man recommended DAPT x3 months followed by aspirin alone.  Bedside swallow showed mild oropharyngeal dysphagia and patient placed on D3 thin liquid diet.  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 .  Patient transferred to CIR on 08/09/2020 .    Patient currently requires max with basic self-care skills secondary to muscle weakness and muscle paralysis, decreased cardiorespiratoy endurance, motor apraxia, decreased coordination and decreased motor planning, decreased visual motor skills and field cut, decreased attention, decreased awareness, decreased problem solving, decreased safety awareness and delayed processing and decreased sitting balance, decreased standing balance, decreased postural control, hemiplegia and decreased balance strategies.  Prior to hospitalization, patient could complete all ADLs and IADLs with independent .  Patient will benefit from skilled intervention to increase independence with basic self-care skills prior to discharge home with care partner.  Anticipate patient will require 24 hour supervision and moderate physical  assestance and home health versus outpatient.  OT - End of Session Activity Tolerance: Tolerates 30+ min activity with multiple  rests Endurance Deficit: Yes Endurance Deficit Description: Pt requires mod intermittent RBs to complete EOB and bed level self care tasks OT Assessment Rehab Potential (ACUTE ONLY): Good OT Patient demonstrates impairments in the following area(s): Balance;Safety;Cognition;Sensory;Skin Integrity;Endurance;Vision;Motor OT Basic ADL's Functional Problem(s): Grooming;Bathing;Eating;Dressing;Toileting OT Transfers Functional Problem(s): Toilet;Tub/Shower OT Additional Impairment(s): Fuctional Use of Upper Extremity OT Plan OT Intensity: Minimum of 1-2 x/day, 45 to 90 minutes OT Frequency: 5 out of 7 days OT Treatment/Interventions: Balance/vestibular training;Discharge planning;Functional electrical stimulation;Pain management;Self Care/advanced ADL retraining;Therapeutic Activities;UE/LE Coordination activities;Cognitive remediation/compensation;Disease mangement/prevention;Functional mobility training;Patient/family education;Skin care/wound managment;Therapeutic Exercise;Visual/perceptual remediation/compensation;DME/adaptive equipment instruction;Neuromuscular re-education;Psychosocial support;Splinting/orthotics;UE/LE Strength taining/ROM;Wheelchair propulsion/positioning;Community reintegration OT Self Feeding Anticipated Outcome(s): mod I OT Basic Self-Care Anticipated Outcome(s): min-mod assist OT Toileting Anticipated Outcome(s): min assist OT Bathroom Transfers Anticipated Outcome(s): min assist OT Recommendation Recommendations for Other Services: Neuropsych consult;Therapeutic Recreation consult Therapeutic Recreation Interventions: Stress management;Other (comment) (leisure) Patient destination: Home Follow Up Recommendations: 24 hour supervision/assistance;Home health OT Equipment Recommended: To be determined Equipment Details: Pt currently does not own any DME   OT Evaluation Precautions/Restrictions  Precautions Precautions: Fall Precaution Comments: Significant Right  sided hemi; intermittent right sided lean Restrictions Weight Bearing Restrictions: No General Chart Reviewed: Yes Pain Pain Assessment Pain Scale: 0-10 Pain Score: 0-No pain Home Living/Prior Functioning Home Living Available Help at Discharge: Family,Available 24 hours/day Type of Home: House Home Access: Stairs to enter [employer] of Steps: 4 Home Layout: One level Bathroom Shower/Tub: Tub/shower unit  Lives With: Family Prior Function Level of Independence: Independent with basic ADLs,Independent with gait,Independent with transfers,Independent with homemaking with ambulation Comments: Pt is a pipe fitter and welder (heavy lifting, pipe carrying, HVAC unit repair). Information obtained from chart review. Vision Baseline Vision/History: No visual deficits Patient Visual Report: No change from baseline Vision Assessment?: Yes Eye Alignment: Within Functional Limits Ocular Range of Motion: Within Functional Limits Alignment/Gaze Preference: Within Defined Limits Tracking/Visual Pursuits: Requires cues, head turns, or add eye shifts to track Saccades: Additional eye shifts occurred during testing Convergence: Within functional limits Visual Fields: Left visual field deficit Perception  Perception: Within Functional Limits Praxis Praxis: Impaired Praxis Impairment Details: Motor planning Cognition Overall Cognitive Status: Difficult to assess Arousal/Alertness: Awake/alert Orientation Level: Nonverbal/unable to assess Year: Other (Comment) (nonverbal/unable to assess) Month:  (nonverbal/unable to assess) Day of Week:  (nonverbal/unable to assess) Memory:  (nonverbal/unable to assess) Immediate Memory Recall:  (nonverbal/unable to assess) Memory Recall Sock:  (nonverbal/unable to assess) Memory Recall Blue:  (nonverbal/unable to assess) Memory Recall Bed:  (nonverbal/unable to assess) Attention: Sustained Sustained Attention: Impaired Sustained Attention  Impairment: Functional basic Awareness: Appears intact (difficult to fully assess due to pt nonverbal) Awareness Impairment: Emergent impairment Problem Solving: Impaired Behaviors: Lability Safety/Judgment: Impaired Sensation Sensation Light Touch: Impaired Detail Light Touch Impaired Details: Impaired RLE;Impaired RUE Hot/Cold: Appears Intact Proprioception: Impaired Detail Proprioception Impaired Details: Absent RUE;Absent RLE Stereognosis: Not tested Coordination Gross Motor Movements are Fluid and Coordinated: No Fine Motor Movements are Fluid and Coordinated: No Coordination and Movement Description: significant right sided hemi Finger Nose Finger Test: UTA RUE to flaccidity; LUE WNL Motor  Motor Motor: Hemiplegia Motor - Skilled Clinical Observations: Right UE and LE flaccidity  Trunk/Postural Assessment  Cervical Assessment Cervical Assessment: Within Functional Limits Thoracic Assessment Thoracic Assessment:  (rounded shoulders) Lumbar Assessment Lumbar Assessment:  (posterior pelvic tilt) Postural Control Postural Control: Deficits on evaluation Righting Reactions: delayed; intermittent  right sided lean requiring min assist to correct to midline  Balance Balance Balance Assessed: Yes Static Sitting Balance Static Sitting - Balance Support: Left upper extremity supported Static Sitting - Level of Assistance: 4: Min assist Dynamic Sitting Balance Dynamic Sitting - Balance Support: Left upper extremity supported Dynamic Sitting - Level of Assistance: 3: Mod assist Extremity/Trunk Assessment RUE Assessment RUE Assessment: Exceptions to [facility] Passive Range of Motion (PROM) Comments: WNL (tested right shoulder to 90 degrees flexion and abduction to protect joint stability) Active Range of Motion (AROM) Comments: None General Strength Comments: 0/5 RUE LUE Assessment LUE Assessment: Within Functional Limits  Care Tool Care Tool Self Care Eating   Eating Assist  Level: Set up assist    Oral Care    Oral Care Assist Level: Minimal Assistance - Patient > 75%    Bathing   Body parts bathed by patient: Right arm;Chest;Abdomen;Front perineal area;Right upper leg;Left upper leg;Face     Assist Level: 2 Helpers    Upper Body Dressing(including orthotics)   What is the patient wearing?: Hospital gown only   Assist Level: Total Assistance - Patient < 25%    Lower Body Dressing (excluding footwear)   What is the patient wearing?: Incontinence brief Assist for lower body dressing: Dependent - Patient 0%    Putting on/Taking off footwear   What is the patient wearing?: Non-skid slipper socks Assist for footwear: Dependent - Patient 0%       Care Tool Toileting Toileting activity   Assist for toileting: 2 Helpers     Care Tool Bed Mobility Roll left and right activity        Sit to lying activity        Lying to sitting edge of bed activity         Care Tool Transfers Sit to stand transfer        Chair/bed transfer         Toilet transfer   Assist Level: 2 Helpers     Care Tool Cognition Expression of Ideas and Wants Expression of Ideas and Wants: Frequent difficulty - frequently exhibits difficulty with expressing needs and ideas   Understanding Verbal and Non-Verbal Content Understanding Verbal and Non-Verbal Content: Usually understands - understands most conversations, but misses some part/intent of message. Requires cues at times to understand   Memory/Recall Ability *first 3 days only Memory/Recall Ability *first 3 days only: None of the above were recalled    Refer to Care Plan for Long Term Goals  SHORT TERM GOAL WEEK 1 OT Short Term Goal 1 (Week 1): Pt will complete sit<>stand at stedy with min assist in preperation for self care. OT Short Term Goal 2 (Week 1): Pt will complete UB dressing with mod assist using hemitechniques. OT Short Term Goal 3 (Week 1): Pt will complete toilet transfer with mod assist +2 using  LRAD. OT Short Term Goal 4 (Week 1): Pt will bathe UB/LB with mod assist using long handled sponge.  Recommendations for other services: Neuropsych and Therapeutic Recreation  Stress management and Other leisure   Skilled Therapeutic Intervention ADL ADL Upper Body Bathing: Moderate assistance Where Assessed-Upper Body Bathing: Edge of bed Lower Body Bathing: Maximal assistance Where Assessed-Lower Body Bathing: Edge of bed;Bed level Upper Body Dressing: Other (Comment) (+2) Where Assessed-Upper Body Dressing: Edge of bed Lower Body Dressing: Dependent Where Assessed-Lower Body Dressing: Bed level Toileting: Dependent;Other (Comment) (+2) Where Assessed-Toileting: Bed level Mobility  Bed Mobility Bed Mobility: Rolling Right;Rolling Left;Sit to Supine;Supine  to Sit Rolling Right: Minimal Assistance - Patient > 75% Rolling Left: 2 Helpers Supine to Sit: Moderate Assistance - Patient 50-74% Sit to Supine: Maximal Assistance - Patient 25-49%   Skilled Intervention: Pt supine in bed, nonverbal but communicating to yes/no questions appropriately using head nod/shake and thumbs up/down.  Reports no c/o pain. Assessed vitals at beginning of session: BP 128/76, pulse 74, O2 on RA 97%.  Initial evaluation completed, educated pt on OT scope of practice, reviewed POC with pt and pt nodding in agreement.  Pt completed self care and functional mobility per above levels of assist required.  Noted pt had incontinent episode of urine therefore pericare completed at bed level and OT assessed skin integrity noting presence of small wound over sacral area.  Nurse notified and came in to assess.  Pt left under nursing care at bed level upon OT departure.   Discharge Criteria: Patient will be discharged from OT if patient refuses treatment 3 consecutive times without medical reason, if treatment goals not met, if there is a change in medical status, if patient makes no progress towards goals or if patient is  discharged from hospital.  The above assessment, treatment plan, treatment alternatives and goals were discussed and mutually agreed upon: by patient  Justin Mason 08/10/2020, 12:44 PM

## 2020-08-11 ENCOUNTER — Inpatient Hospital Stay (HOSPITAL_COMMUNITY): Payer: Managed Care, Other (non HMO)

## 2020-08-11 DIAGNOSIS — R0989 Other specified symptoms and signs involving the circulatory and respiratory systems: Secondary | ICD-10-CM

## 2020-08-11 DIAGNOSIS — G8191 Hemiplegia, unspecified affecting right dominant side: Secondary | ICD-10-CM

## 2020-08-11 LAB — GLUCOSE, CAPILLARY
Glucose-Capillary: 148 mg/dL — ABNORMAL HIGH (ref 70–99)
Glucose-Capillary: 167 mg/dL — ABNORMAL HIGH (ref 70–99)
Glucose-Capillary: 161 mg/dL — ABNORMAL HIGH (ref 70–99)
Glucose-Capillary: 126 mg/dL — ABNORMAL HIGH (ref 70–99)

## 2020-08-11 IMAGING — CT CT HEAD W/O CM
3 series · 14 of 47 positions shown, 16 images · non-contrast
Comparison: MRI [DATE].

CLINICAL DATA: Neuro deficit, acute stroke suspected.

EXAM:
CT HEAD WITHOUT CONTRAST
TECHNIQUE: Contiguous axial images were obtained from the base of the skull
through the vertex without intravenous contrast.

[Series 3: head 5.0 h30s · axial · 0.44mm/px · z∈[-409,-259]mm · 8 of 36 slices shown, 10 images]
[im 3/36  brain]
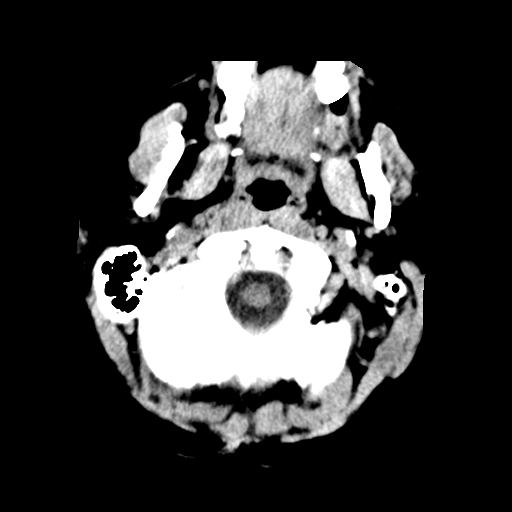
[im 3/36  bone]
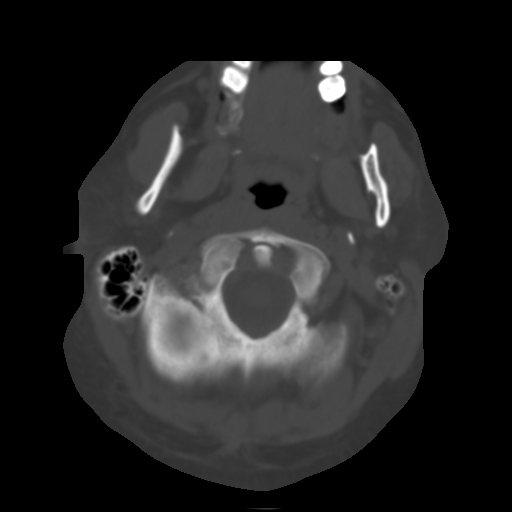
[im 8/36  brain]
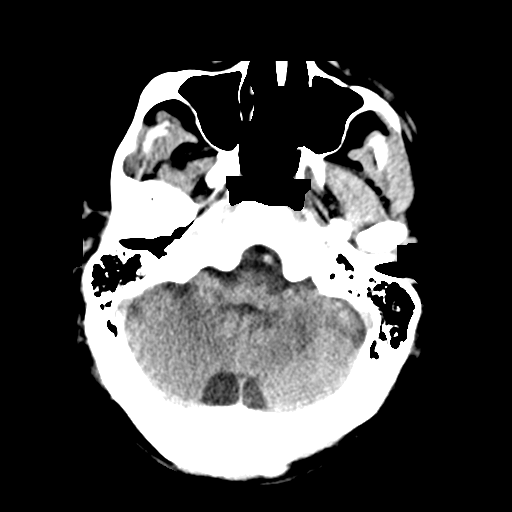
[im 11/36  brain]
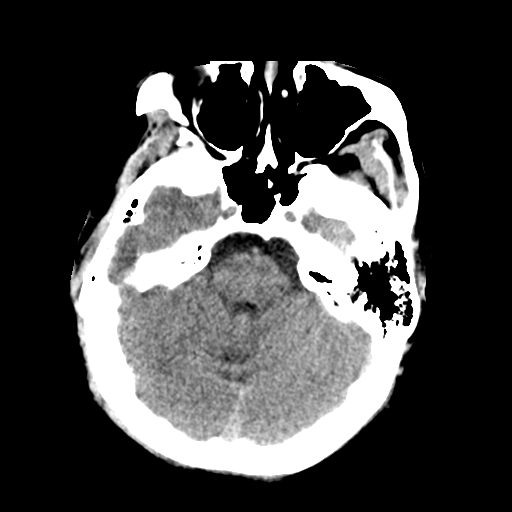
[im 16/36  brain]
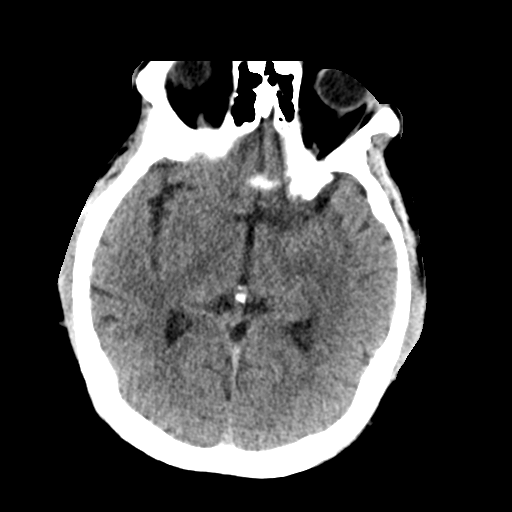
[im 20/36  brain]
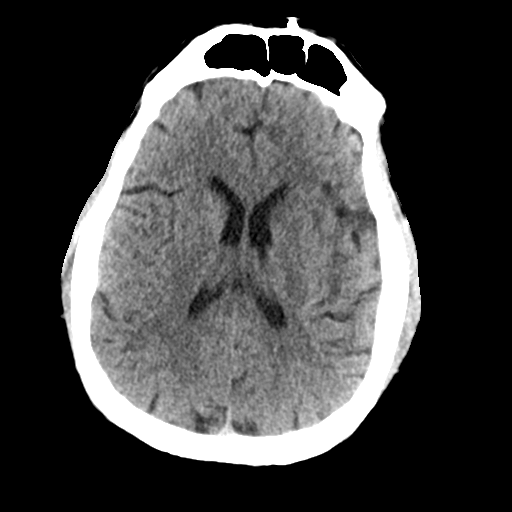
[im 20/36  bone]
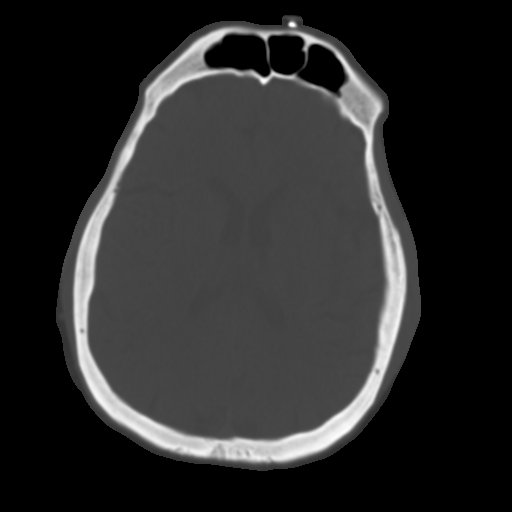
[im 25/36  brain]
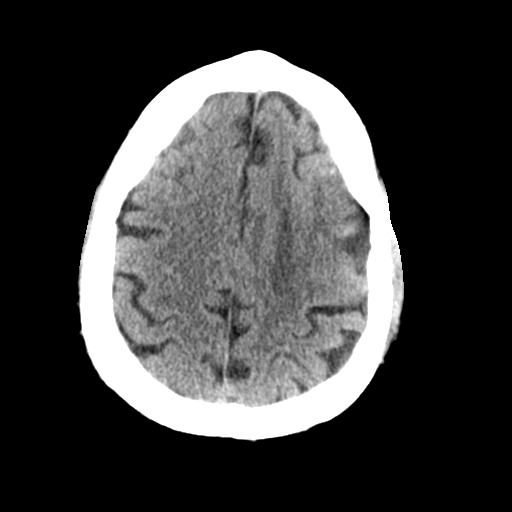
[im 28/36  brain]
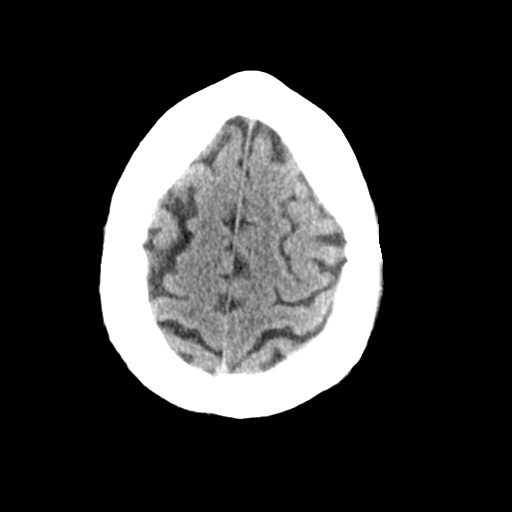
[im 33/36  brain]
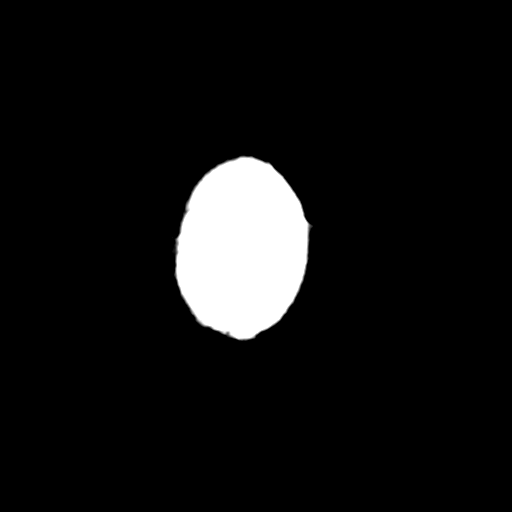

[Series 5: head 3.0 mpr cor · coronal · 0.33mm/px · 3 of 81 slices shown]
[im 27/81  brain]
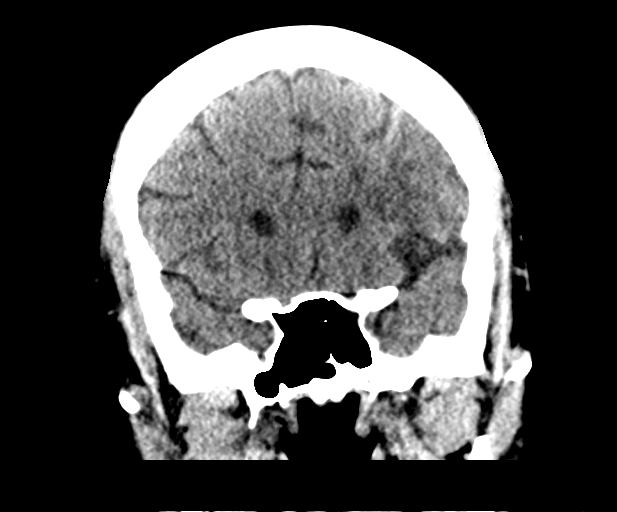
[im 36/81  brain]
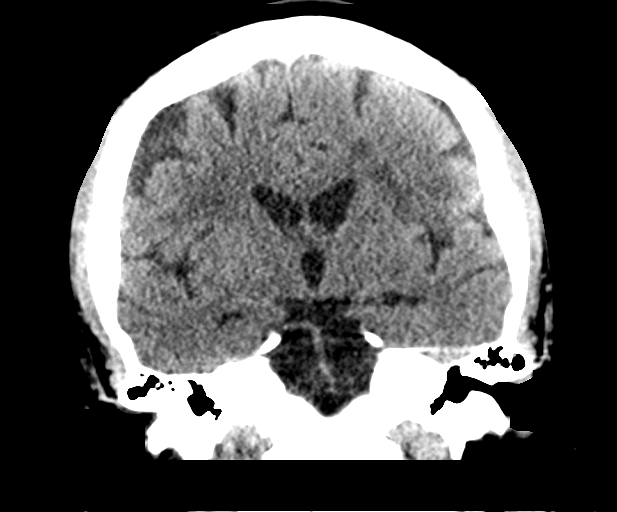
[im 45/81  brain]
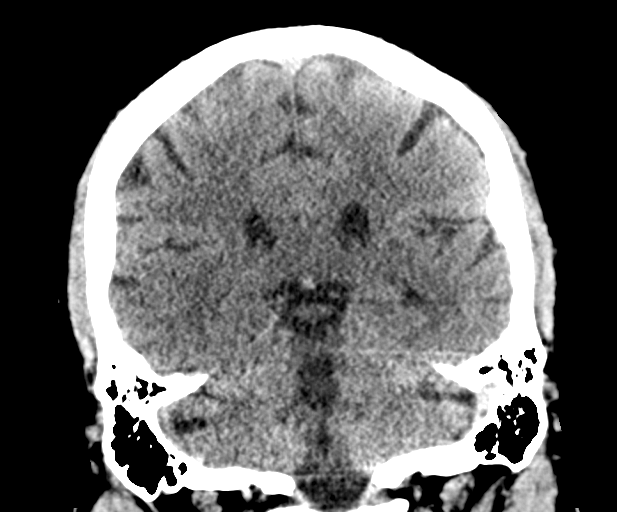

[Series 6: head 3.0 mpr sag · sagittal · 0.34mm/px · 3 of 67 slices shown]
[im 23/67  brain]
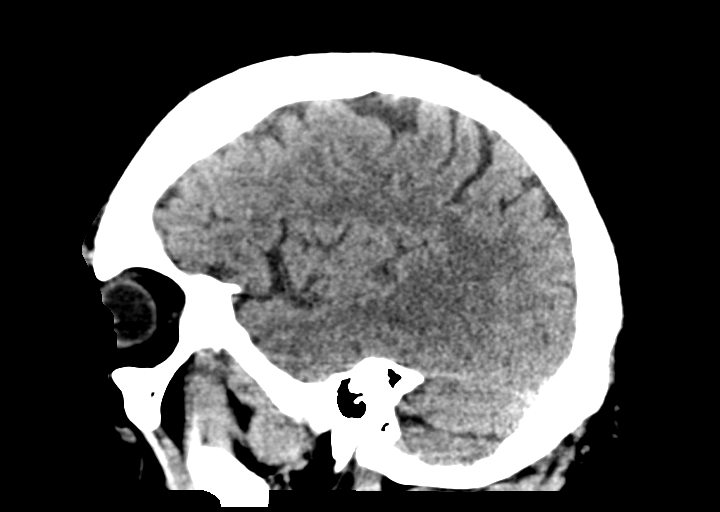
[im 34/67  brain]
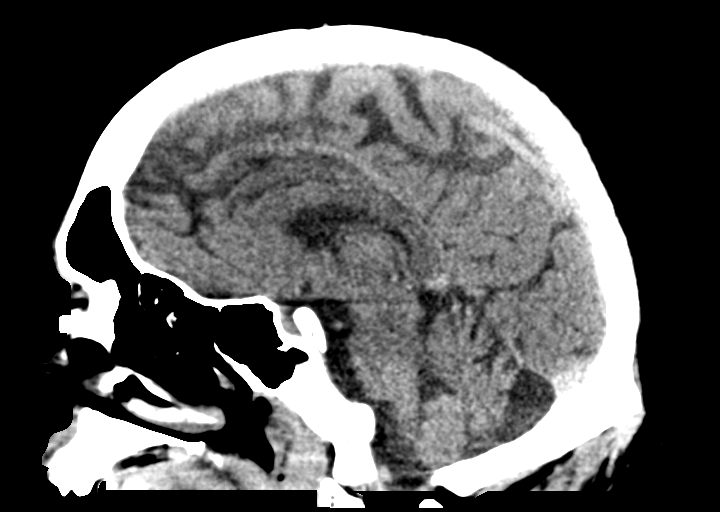
[im 45/67  brain]
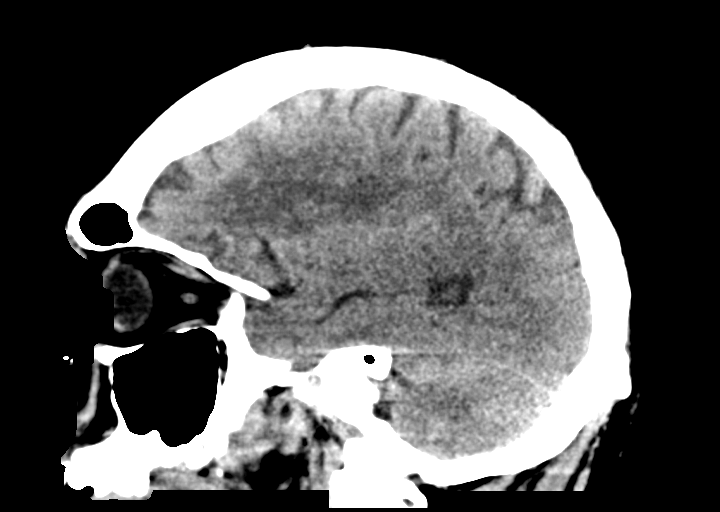

[14 of 47 positions shown; findings below may reference images not displayed]

FINDINGS: Brain: Cortical and subcortical hypoattenuation involving the left
frontal lobe, compatible with evolving left MCA territory infarct.
No definite new/interval acute infarct. Mild areas of cortical
hyperdensity may represent petechial hemorrhage. No mass occupying
hemorrhagic transformation. No substantial mass effect. No midline
shift. Basal cisterns are patent. Similar tiny focus of hypodensity
within the left caudate head. Similar incidental small
retrocerebellar arachnoid cyst.

Vascular: Calcific atherosclerosis.

Skull: No acute fracture.

Sinuses/Orbits: Mild ethmoid air cell mucosal thickening. Otherwise
clear sinuses. Unremarkable orbits.

Other: No mastoid effusions.
IMPRESSION: Evolving left MCA territory infarct. Mild areas of cortical
hyperdensity may represent petechial hemorrhage. No mass occupying
hemorrhagic transformation or substantial mass effect. No definite
new/interval acute infarct, although MRI could provide more
sensitive evaluation for extension of infarct.

## 2020-08-11 NOTE — Progress Notes (Signed)
[facility] PHYSICAL MEDICINE & REHABILITATION PROGRESS NOTE  Subjective/Complaints: Patient seen in bed this morning.  No reported issues overnight.  Appears to have had a good first day of therapies yesterday.  ROS: Unable to obtain due to aphasia  Objective: Vital Signs: Blood pressure (!) 92/59, pulse (!) 57, temperature 97.9 F (36.6 C), temperature source Oral, resp. rate 18, height 6\' 1"  (1.854 m), weight (!) 159.1 kg, SpO2 98 %. VAS LOWER EXTREMITY VENOUS (DVT)  Result Date: 08/10/2020  Lower Venous DVT Study Indications: Swelling.  Risk Factors: Surgery Back. Comparison Study: Prev 2016 negative Performing Technologist: 2017 RVT  Examination Guidelines: A complete evaluation includes B-mode imaging, spectral Doppler, color Doppler, and power Doppler as needed of all accessible portions of each vessel. Bilateral testing is considered an integral part of a complete examination. Limited examinations for reoccurring indications may be performed as noted. The reflux portion of the exam is performed with the patient in reverse Trendelenburg.  +---------+---------------+---------+-----------+----------+--------------+ RIGHT    CompressibilityPhasicitySpontaneityPropertiesThrombus Aging +---------+---------------+---------+-----------+----------+--------------+ CFV      Full           Yes      Yes                                 +---------+---------------+---------+-----------+----------+--------------+ SFJ      Full                                                        +---------+---------------+---------+-----------+----------+--------------+ FV Prox  Full                                                        +---------+---------------+---------+-----------+----------+--------------+ FV Mid   Full                                                        +---------+---------------+---------+-----------+----------+--------------+ FV DistalFull                                                         +---------+---------------+---------+-----------+----------+--------------+ PFV      Full                                                        +---------+---------------+---------+-----------+----------+--------------+ POP      Full           Yes      Yes                                 +---------+---------------+---------+-----------+----------+--------------+ PTV  Full                                                        +---------+---------------+---------+-----------+----------+--------------+ PERO     Full                                                        +---------+---------------+---------+-----------+----------+--------------+   +---------+---------------+---------+-----------+----------+--------------+ LEFT     CompressibilityPhasicitySpontaneityPropertiesThrombus Aging +---------+---------------+---------+-----------+----------+--------------+ CFV      Full           Yes      Yes                                 +---------+---------------+---------+-----------+----------+--------------+ SFJ      Full                                                        +---------+---------------+---------+-----------+----------+--------------+ FV Prox  Full                                                        +---------+---------------+---------+-----------+----------+--------------+ FV Mid   Full                                                        +---------+---------------+---------+-----------+----------+--------------+ FV DistalFull                                                        +---------+---------------+---------+-----------+----------+--------------+ PFV      Full                                                        +---------+---------------+---------+-----------+----------+--------------+ POP      Full           Yes      Yes                                  +---------+---------------+---------+-----------+----------+--------------+ PTV      Full                                                        +---------+---------------+---------+-----------+----------+--------------+  PERO     Full                                                        +---------+---------------+---------+-----------+----------+--------------+   Summary: BILATERAL: - No evidence of deep vein thrombosis seen in the lower extremities, bilaterally. -No evidence of popliteal cyst, bilaterally.   *See table(s) above for measurements and observations. Electronically signed by Sherald Hess MD on 08/10/2020 at 12:36:13 PM.    Final    Recent Labs    08/10/20 0625  WBC 9.0  HGB 14.8  HCT 40.8  PLT 213   Recent Labs    08/10/20 0625  NA 134*  K 4.2  CL 98  CO2 24  GLUCOSE 152*  BUN 35*  CREATININE 1.33*  CALCIUM 9.8    Intake/Output Summary (Last 24 hours) at 08/11/2020 0817 Last data filed at 08/11/2020 1497 Gross per 24 hour  Intake 600 ml  Output 750 ml  Net -150 ml     Pressure Injury 08/09/20 Coccyx Mid Stage 2 -  Partial thickness loss of dermis presenting as a shallow open injury with a red, pink wound bed without slough. (Active)  08/09/20 1555  Location: Coccyx  Location Orientation: Mid  Staging: Stage 2 -  Partial thickness loss of dermis presenting as a shallow open injury with a red, pink wound bed without slough.  Wound Description (Comments):   Present on Admission: Yes     Pressure Injury 08/09/20 Buttocks Left;Right Deep Tissue Pressure Injury - Purple or maroon localized area of discolored intact skin or blood-filled blister due to damage of underlying soft tissue from pressure and/or shear. (Active)  08/09/20 1716  Location: Buttocks  Location Orientation: Left;Right  Staging: Deep Tissue Pressure Injury - Purple or maroon localized area of discolored intact skin or blood-filled blister due to damage of underlying soft tissue from  pressure and/or shear.  Wound Description (Comments):   Present on Admission: Yes    Physical Exam: BP (!) 92/59 (BP Location: Left Arm)   Pulse (!) 57   Temp 97.9 F (36.6 C) (Oral)   Resp 18   Ht 6\' 1"  (1.854 m)   Wt (!) 159.1 kg   SpO2 98%   BMI 46.28 kg/m  Constitutional: No distress . Vital signs reviewed.  Morbidly obese. HENT: Normocephalic.  Atraumatic. Eyes: EOMI. No discharge. Cardiovascular: No JVD.  RRR. Respiratory: Normal effort.  No stridor.  Bilateral clear to auscultation. GI: Non-distended.  BS +. Skin: Warm and dry.  Intact. Psych: Unable to obtain due to cognition. Musc: No edema in extremities.  No tenderness in extremities. Neuro: Alert Expressive aphasia, unchanged Nonverbal Motor: RUE/RLE: 0/5 proximal distal, stable No increase in tone noted LUE/LLE: 5/5 proximal to distal   Assessment/Plan: 1. Functional deficits which require 3+ hours per day of interdisciplinary therapy in a comprehensive inpatient rehab setting.  Physiatrist is providing close team supervision and 24 hour management of active medical problems listed below.  Physiatrist and rehab team continue to assess barriers to discharge/monitor patient progress toward functional and medical goals   Care Tool:  Bathing    Body parts bathed by patient: Right arm,Chest,Abdomen,Front perineal area,Right upper leg,Left upper leg,Face         Bathing assist Assist Level: 2 Helpers     Upper Body Dressing/Undressing Upper  body dressing   What is the patient wearing?: Hospital gown only    Upper body assist Assist Level: Minimal Assistance - Patient > 75%    Lower Body Dressing/Undressing Lower body dressing      What is the patient wearing?: Incontinence brief     Lower body assist Assist for lower body dressing: Dependent - Patient 0%     Toileting Toileting    Toileting assist Assist for toileting: 2 Helpers Assistive Device Comment: Urinal/bedpan    Transfers Chair/bed transfer  Transfers assist  Chair/bed transfer activity did not occur: Safety/medical concerns        Locomotion Ambulation   Ambulation assist   Ambulation activity did not occur: Safety/medical concerns          Walk 10 feet activity   Assist  Walk 10 feet activity did not occur: Safety/medical concerns        Walk 50 feet activity   Assist Walk 50 feet with 2 turns activity did not occur: Safety/medical concerns         Walk 150 feet activity   Assist Walk 150 feet activity did not occur: Safety/medical concerns         Walk 10 feet on uneven surface  activity   Assist Walk 10 feet on uneven surfaces activity did not occur: Safety/medical concerns         Wheelchair     Assist Will patient use wheelchair at discharge?: Yes Type of Wheelchair: Manual    Wheelchair assist level: Minimal Assistance - Patient > 75% Max wheelchair distance: 100    Wheelchair 50 feet with 2 turns activity    Assist        Assist Level: Minimal Assistance - Patient > 75%   Wheelchair 150 feet activity     Assist      Assist Level: Moderate Assistance - Patient 50 - 74%    Medical Problem List and Plan: 1.  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  Continue CIR  WHO/PRAFO ordered 2.  Antithrombotics: -DVT/anticoagulation:  Pharmaceutical: Lovenox  Dopplers negative for DVT             -antiplatelet therapy: DAPT X 3 months followed by ASA alone--Plavix added on 08/09/20 3. Pain Management: Mobic DC'd             Monitor with increased exertion 4. Mood: LCSW to follow for evaluation and support.              -antipsychotic agents: N/A 5. Neuropsych: This patient is not capable of making decisions on his own behalf. 6. Skin/Wound Care: Routine pressure relief measures.  7.  Fluids/Electrolytes/Nutrition: Monitor I/Os.  8. HTN: Monitor BP tid--continue Catapres twice daily, Avapro, Lopressor bid.   HCTZ daily, DC'd on 2/7  Labile on 2/6             Monitor with increased mobility, labile on 2/5 9. T2DM with hyperglycemia--controlled: Hgb A1c-6.2. Now on metformin 1000 mg bid--resume [place].   Elevated on 2/7             Monitor with increased mobility 10. Covid 19--Positive  on 07/24/20-->no treatment as asymptomatic. Continue zinc and vitamin C.  11. Morbid obesity: BMI 46. Educate on appropriate diet and exercise to help promote health and mobility.              Encourage weight loss 12.  Hyperlipidemia: Crestor 13. H/o gout: continue allopurinol.  14. Hyponatremia  Na+ 134 on 2/5  Continue to monitor 15. AKI  Cr. 1.33 on 2/5, labs ordered for tomorrow  Encourage fluids 16. Transaminitis  ALT elevated on 2/5, continue to monitor   LOS: 2 days A FACE TO FACE EVALUATION WAS PERFORMED  Smaran Gaus Karis Juba 08/11/2020, 8:17 AM

## 2020-08-11 NOTE — Progress Notes (Signed)
Brief note:  Please see progress note from earlier today.  Later notified by nursing regarding change in mental status with change in communication as well as swallowing ability.  CT head ordered.

## 2020-08-11 NOTE — Progress Notes (Signed)
Physical Therapy Session Note  Patient Details  Name: Justin Mason MRN: [id number] Date of Birth: June 20, 1981  Today's Date: 08/11/2020 PT Individual Time: 0901-0957 PT Individual Time Calculation (min): 56 min   Short Term Goals: Week 1:  PT Short Term Goal 1 (Week 1): Pt will perform bed mobility with mod assist PT Short Term Goal 2 (Week 1): Pt will trasnfer to and from [facility] with max assist of 1 without stedy PT Short Term Goal 3 (Week 1): Pt will remain in WC >2 hours between therapies  Skilled Therapeutic Interventions/Progress Updates:  Pt was seen bedside in the am. Pt transferred to supine to edge of bed with max A and verbal cues. Pt tolerated edge of bed with UE support and c/g to min A with verbal cues. Pt transferred sit to stand with stedy and min to mod A. Pt transferred edge of bed to w/c with stedy. Treatment focused in standing in the stedy and increasing standing tolerance and well as upright posture and weight shifting. Pt performed all sit to stand transfers with stedy and min to mod A with verbal cues. Pt transferred edge of bed to supine with max A and verbal cues. Pt rolled R/L with siderail and mod to max A to assist with changing brief. Pt moved up in bed and left with all needs within reach and bed alarm on.   Therapy Documentation Precautions:  Precautions Precautions: Fall Precaution Comments: Significant Right sided hemi; intermittent right sided lean Restrictions Weight Bearing Restrictions: No General:   Pain: No c/o pain.   Therapy/Group: Individual Therapy  Rayford Halsted 08/11/2020, 12:07 PM

## 2020-08-11 NOTE — Progress Notes (Signed)
MD notified of resulted CT

## 2020-08-11 NOTE — Progress Notes (Signed)
Nurse went to assist pt with feeding at lunch. Pt noted to have some difficulty w/ following commands, and would not chew/swallow food when instructed. Neuro check assessed, vitals WNL, and pt alert. Pt mute and not able to express needs verbally, but nonverbal communication decreased. MD Allena Katz notified of pt status, STAT CT ordered. Will notify when resulted.

## 2020-08-11 NOTE — Progress Notes (Signed)
Orthopedic Tech Progress Note Patient Details:  TRAMELL PIECHOTA [birth year] [id number] Ordered pts Rehab Combination devices Patient ID: RONIT MARCZAK, male   DOB: 30-Jan-1981, 40 y.o.   MRN: [id number]   Gerald Stabs 08/11/2020, 11:57 AM

## 2020-08-12 LAB — GLUCOSE, CAPILLARY
Glucose-Capillary: 166 mg/dL — ABNORMAL HIGH (ref 70–99)
Glucose-Capillary: 156 mg/dL — ABNORMAL HIGH (ref 70–99)
Glucose-Capillary: 156 mg/dL — ABNORMAL HIGH (ref 70–99)
Glucose-Capillary: 132 mg/dL — ABNORMAL HIGH (ref 70–99)

## 2020-08-12 LAB — CBC
HCT: 44.5 % (ref 39.0–52.0)
Hemoglobin: 15.2 g/dL (ref 13.0–17.0)
MCH: 29.8 pg (ref 26.0–34.0)
MCHC: 34.2 g/dL (ref 30.0–36.0)
MCV: 87.3 fL (ref 80.0–100.0)
Platelets: 225 10*3/uL (ref 150–400)
RBC: 5.1 MIL/uL (ref 4.22–5.81)
RDW: 12.8 % (ref 11.5–15.5)
WBC: 10.9 10*3/uL — ABNORMAL HIGH (ref 4.0–10.5)
nRBC: 0 % (ref 0.0–0.2)

## 2020-08-12 LAB — BASIC METABOLIC PANEL
Anion gap: 13 (ref 5–15)
BUN: 47 mg/dL — ABNORMAL HIGH (ref 6–20)
CO2: 24 mmol/L (ref 22–32)
Calcium: 10.2 mg/dL (ref 8.9–10.3)
Chloride: 97 mmol/L — ABNORMAL LOW (ref 98–111)
Creatinine, Ser: 1.57 mg/dL — ABNORMAL HIGH (ref 0.61–1.24)
GFR, Estimated: 57 mL/min — ABNORMAL LOW (ref >60)
Glucose, Bld: 154 mg/dL — ABNORMAL HIGH (ref 70–99)
Potassium: 4.2 mmol/L (ref 3.5–5.1)
Sodium: 134 mmol/L — ABNORMAL LOW (ref 135–145)

## 2020-08-12 MED ORDER — SODIUM CHLORIDE 0.9 % IV SOLN: INTRAVENOUS | Status: DC

## 2020-08-12 MED ORDER — BLOOD PRESSURE CONTROL BOOK
Freq: Once | Status: AC
  Filled 2020-08-12: qty 1

## 2020-08-12 MED ORDER — EXERCISE FOR HEART AND HEALTH BOOK
Freq: Once | Status: AC
  Filled 2020-08-12: qty 1

## 2020-08-12 NOTE — Progress Notes (Signed)
Occupational Therapy Session Note  Patient Details  Name: Justin Mason MRN: [id number] Date of Birth: 01-11-1981  Today's Date: 08/12/2020 OT Individual Time: 1308-1406 OT Individual Time Calculation (min): 58 min    Short Term Goals: Week 1:  OT Short Term Goal 1 (Week 1): Pt will complete sit<>stand at stedy with min assist in preperation for self care. OT Short Term Goal 2 (Week 1): Pt will complete UB dressing with mod assist using hemitechniques. OT Short Term Goal 3 (Week 1): Pt will complete toilet transfer with mod assist +2 using LRAD. OT Short Term Goal 4 (Week 1): Pt will bathe UB/LB with mod assist using long handled sponge.  Skilled Therapeutic Interventions/Progress Updates:    Treatment session with focus on functional mobility, sit > stand, dynamic standing balance, and RUE NMR.  Pt received supine in bed agreeable to therapy session.  Pt completed bed mobility with mod assist to come to sitting EOB with cues to utilize bed rails.  Engaged in lateral scoots along EOB with +2 to facilitate weight shifting.  Completed sit > stand in to [place] with mod assist +2.  Pt transferred to w/c via Stedy dependent.  Pt stood from [facility] flaps with min assist prior to returning to seated in w/c.  Engaged in sit > stand x3 at high-low table with +2 assist.  Focus placed on anterior weight shift and ability to push up in to standing.  Pt required mod assist +2 with blocking at R knee.  Pt able to achieve full upright standing with cues and +2 to maintain upright posture.  Pt able to weight shift to reacher LUE for items on table top to challenge standing balance.  BP assessed post standing 104/71 and HR 71, pt sweaty but not endorsing any dizziness.  Pt returned to room, completed transfer back to bed via Stedy.  Engaged in rolling at bed level to engage in LB hygiene as pt incontinent of bladder.  Educated on body mechanics to assist with rolling, pt able to utilize LUE and LLE to assist with rolling  and boosting up in bed, however still requires +2 for hygiene and clothing management at bed level.  Pt remained semi-reclined in bed with all needs in reach, pillow under RUE for improved positioning.  Therapy Documentation Precautions:  Precautions Precautions: Fall Precaution Comments: Significant Right sided hemi; intermittent right sided lean Restrictions Weight Bearing Restrictions: No General:   Vital Signs: Therapy Vitals Temp: 98.2 F (36.8 C) Pulse Rate: 63 Resp: 17 BP: 97/65 Patient Position (if appropriate): Sitting Oxygen Therapy SpO2: 96 % O2 Device: Room Air Pain:  Pt with no c/o pain   Therapy/Group: Individual Therapy  Rosalio Loud 08/12/2020, 3:13 PM

## 2020-08-12 NOTE — Progress Notes (Signed)
Inpatient Rehabilitation Care Coordinator Assessment and Plan Patient Details  Name: Justin Mason MRN: [id number] Date of Birth: 03/10/1981  Today's Date: 08/12/2020  Hospital Problems: Principal Problem:   Acute cerebral infarction [facility]) Active Problems:   Pressure injury of skin   Hyponatremia   AKI (acute kidney injury) (HCC)   Transaminitis   Benign essential HTN   Labile blood pressure   Right hemiplegia (HCC)  Past Medical History:  Past Medical History:  Diagnosis Date  . AKI (acute kidney injury) (HCC)   . Arthritis   . Cardiac murmur 05/24/2020  . Cellulitis 01/03/2015  . Cellulitis of right leg   . Cellulitis of right lower extremity 01/03/2015  . Essential hypertension   . GERD (gastroesophageal reflux disease)   . High cholesterol   . Hyperlipemia   . Hypertension   . Morbid obesity (HCC) 05/24/2020  . OSA (obstructive sleep apnea)   . Rotator cuff tear, right 09/08/2011  . Sepsis (HCC) 01/03/2015  . Stroke (HCC)   . Type 2 diabetes mellitus with hyperglycemia [facility])    Past Surgical History:  Past Surgical History:  Procedure Laterality Date  . INCISION AND DRAINAGE FOOT  1991-1992   STEPPED ON NAIL / INFECTED WOUND  . IR ANGIO INTRA EXTRACRAN SEL COM CAROTID INNOMINATE BILAT MOD SED  07/24/2020  . IR ANGIO VERTEBRAL SEL SUBCLAVIAN INNOMINATE UNI L MOD SED  07/24/2020  . IR ANGIO VERTEBRAL SEL VERTEBRAL UNI R MOD SED  07/24/2020  . RADIOLOGY WITH ANESTHESIA N/A 07/24/2020   Procedure: IR WITH ANESTHESIA - CODE STROKE;  Surgeon: Radiologist, Medication, MD;  Location: [facility] OR;  Service: Radiology;  Laterality: N/A;  . SHOULDER SURGERY     September 08, 2011   Social History:  reports that he has never smoked. His smokeless tobacco use includes snuff. He reports current alcohol use of about 7.0 standard drinks of alcohol per week. He reports that he does not use drugs.  Family / Support Systems Marital Status: Single Patient Roles: Parent,Partner,Other (Comment)  (employee) Spouse/Significant Other: [name] maness-girlfriend [phone] Other Supports: Abran Cantor [phone] Two siblings-sister and brother Anticipated Caregiver: Nadyne [place]ombes and siblings Ability/Limitations of Caregiver: Mom has health issues-min at best and others can physically assist Caregiver Availability: 24/7 Family Dynamics: Close with parents and siblings, has four nephews who he treats like his own children. Mom reports family is very close and take care of each other.  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 Will look toward his parents since has no children and no formal HC-POA or POA in place   Abuse/Neglect Abuse/Neglect Assessment Can Be Completed: Unable to assess, patient is non-responsive or altered mental status (non-verbal due to CVA) Physical Abuse: Denies Verbal Abuse: Denies Sexual Abuse: Denies Exploitation of patient/patient's resources: Denies Self-Neglect: Denies  Emotional Status Pt's affect, behavior and adjustment status: Recieved information from Mom due to pt unable to due to non-verbal due to CVA. Mom reports he was active and helped others. He was the one who would always be the first to help ands would even help a starnger. He worked hard and was very active prior to this  CVA Recent Psychosocial Issues: health issues but were managed Psychiatric History: No issues deferred depression screen due to not appropriate at this time due to CVA-asphaisa. Would benefit from neruo-psych once able and appropriate Substance Abuse History:  No issues  Patient / Family Perceptions, Expectations & Goals Pt/Family understanding of illness & functional limitations: Mom can explan his stroke and deficits. She is wanting an update from MD she has not talked with one for one week now. Have text MD regarding this request. She is aware son will require 24/7 care at discharge from CIR Premorbid pt/family roles/activities: Son, partner, employee, nephew, uncle, friend Anticipated changes in roles/activities/participation: resume Pt/family expectations/goals: Mom states: " He would take care of [place] we will him, he is a good son."  Building surveyor: None Premorbid Home Care/DME Agencies: None Transportation available at discharge: Pt drove PTA now will rely upon family members Resource referrals recommended: Neuropsychology  Discharge Planning Living Arrangements: Parent Support Systems: Spouse/significant other,Parent,Other relatives,Friends/neighbors Type of Residence: Private residence Civil engineer, contracting: Media planner (specify) Counselling psychologist) Financial Resources: Employment Surveyor, quantity Screen Referred: No Living Expenses: Lives with family Money Management: Patient Does the patient have any problems obtaining your medications?: No Home Management: Mom does Patient/Family Preliminary Plans: Return home with Mom who does have health issues-cardiac and lung. She is independent and takes care of herself and the house. She will have assist from girlfriend and pt's siblings. Discussed she will need assist and support. Made aware pt will require 24/7 physical care but remained hopeful. Care Coordinator Barriers to Discharge: Weight Care Coordinator Barriers to Discharge Comments: large man will need to be mobile if possible Care Coordinator Anticipated Follow Up Needs: HH/OP  Clinical Impression: Obtained information from Mom since pt could not participate due to non-verbal due to CVA deficits. Pt was very active prior  to admission and was very close with family. Mom reports between she, girlfriend and his siblings they will provide 24/7 care. Pt is a large man, will have neuro-psych see once appropriate and work on safe discharge plan    Lucy Chris 08/12/2020, 10:28 AM

## 2020-08-12 NOTE — Progress Notes (Signed)
Inpatient Rehabilitation Center Individual Statement of Services  Patient Name:  Justin Mason  Date:  08/12/2020  Welcome to the Inpatient Rehabilitation Center.  Our goal is to provide you with an individualized program based on your diagnosis and situation, designed to meet your specific needs.  With this comprehensive rehabilitation program, you will be expected to participate in at least 3 hours of rehabilitation therapies Monday-Friday, with modified therapy programming on the weekends.  Your rehabilitation program will include the following services:  Physical Therapy (PT), Occupational Therapy (OT), Speech Therapy (ST), 24 hour per day rehabilitation nursing, Neuropsychology, Care Coordinator, Rehabilitation Medicine, Nutrition Services and Pharmacy Services  Weekly team conferences will be held on wednesday to discuss your progress.  Your Inpatient Rehabilitation Care Coordinator will talk with you frequently to get your input and to update you on team discussions.  Team conferences with you and your family in attendance may also be held.  Expected length of stay: 3.5-4 weeks  Overall anticipated outcome: min-mod level of assist  Depending on your progress and recovery, your program may change. Your Inpatient Rehabilitation Care Coordinator will coordinate services and will keep you informed of any changes. Your Inpatient Rehabilitation Care Coordinator's name and contact numbers are listed  below.  The following services may also be recommended but are not provided by the Inpatient Rehabilitation Center:   Driving Evaluations  Home Health Rehabiltiation Services  Outpatient Rehabilitation Services  Vocational Rehabilitation   Arrangements will be made to provide these services after discharge if needed.  Arrangements include referral to agencies that provide these services.  Your insurance has been verified to be:  [place] Your primary doctor is:  Terrilyn Saver  Pertinent  information will be shared with your doctor and your insurance company.  Inpatient Rehabilitation Care Coordinator:  Dossie Der, [place] [phone] or Luna Glasgow  Information discussed with and copy given to patient by: Lucy Chris, 08/12/2020, 10:31 AM

## 2020-08-12 NOTE — Progress Notes (Signed)
Speech Language Pathology Daily Session Note  Patient Details  Name: Justin Mason MRN: [id number] Date of Birth: Nov 02, 1980  Today's Date: 08/12/2020 SLP Individual Time: 6546-5035 SLP Individual Time Calculation (min): 45 min  Short Term Goals: Week 1: SLP Short Term Goal 1 (Week 1): Pt will follow multistep commands during basic and familiar tasks in 75% of opportunities with min verbal cues SLP Short Term Goal 2 (Week 1): Pt will answer simple-compex yes/no questions with multimodal responses (head nod/shake, thumbs up/down, verbalizations) with 90% accuracy given min A verbal cues SLP Short Term Goal 3 (Week 1): Pt will vocalize on command in 50% of opportunities with Mod A multimodal cues SLP Short Term Goal 4 (Week 1): Pt will consume regular/thin diet with Min A cues to utilize compensatory swallow strategies  Skilled Therapeutic Interventions: Pt seen for skilled ST services with focus on dysphagia and communication, pt sitting upright in w/c. Pt participating in writing exercise with focus on copying high frequency words (name, basic want/needs, etc) with 80% accuracy. Pt continues with no vocalizations this date despite max multimodal cues for yawn/sigh, single phonemes, vowels and melodic intonation therapy. Pt with improved approximation for bilabials this date with use of mirror for feedback. At this point, SLP noticed patient sweating profusely and obtained nursing to assess. Pt denying feeling poorly, hot or dizzy. Nursing taking vitals showing BP 92/63 and decision was made to transfer patient back to bed. SLP and nurse transferring pt via the Steady. Once pt in bed, SLP encouraging fluid intake with feedback for safe swallow strategies as trained. Pt seen with 3 oz thin liquid via straw with no overt s/s aspiration. Pt left in bed with alarm set and all needs within reach. Cont ST POC.   Pain Pain Assessment Pain Scale: 0-10 Pain Score: 0-No pain  Therapy/Group: Individual  Therapy  Tacey Ruiz 08/12/2020, 10:59 AM

## 2020-08-12 NOTE — Progress Notes (Signed)
Inpatient Rehabilitation  Patient information reviewed and entered into eRehab system by Alayasia Breeding M. Bradleigh Sonnen, M.A., CCC/SLP, PPS Coordinator.  Information including medical coding, functional ability and quality indicators will be reviewed and updated through discharge.    

## 2020-08-12 NOTE — Progress Notes (Signed)
Patient ID: Justin Mason, male   DOB: [birth year], 40 y.o.   MRN: [id number] Met with the patient and significant other to review role of the nurse CM and address educational needs  Along with collaboration with the SW to facilitate preparation for discharge. Reviewed secondary stroke risk factors and given handouts on HTN, DASH diet, Dietary modifications for high triglycerides (190) and DM management (A1C of 6.2) with exercise.  Reviewed additional risks including recent COVID and OSA. Reviewed DAPT for 3 months then ASA solo per MD along with medications prescribed. Reported poor appetite and discussed bringing in food from home as on regular texture diet while keeping note of salt and sugar in foods. No other questions noted at present. Continue to follow along to discharge to address educational needs. Margarito Liner

## 2020-08-12 NOTE — Progress Notes (Signed)
Physical Therapy Session Note  Patient Details  Name: Justin Mason MRN: [id number] Date of Birth: 07-15-1980  Today's Date: 08/12/2020 PT Individual Time: 0900-0957 PT Individual Time Calculation (min): 57 min   Short Term Goals: Week 1:  PT Short Term Goal 1 (Week 1): Pt will perform bed mobility with mod assist PT Short Term Goal 2 (Week 1): Pt will trasnfer to and from [facility] with max assist of 1 without stedy PT Short Term Goal 3 (Week 1): Pt will remain in WC >2 hours between therapies  Skilled Therapeutic Interventions/Progress Updates:   Received pt supine in bed, pt non-verbal but able to communicate via thumbs up/down gesturing that he was not in pain. Session with emphasis on peri-care, dressing, functional mobility/transfers, generalized strengthening, dynamic sitting/standing balance/coordination, and improved activity tolerance. No +2 available during session. Pt with increased diaphoresis throughout session. Pt found to be incontinent of urine and rolled to L/R with max A and doffed dirty brief, performed peri-care, and donned clean brief with total A. Donned shorts in supine with max A and pt able to use LUE/LE to pull pants over hips on L side. Pt transferred supine<>sitting EOB with heavy max A due to dense R hemi. Pt able to maintain static sitting balance with CGA. Doffed dirty gown and donned clean pull over shirt with max A with total A to thread RUE through arm hole. Pt transferred sit<>stand in Stedy from elevated bed with max A and transferred dependently to WC. Pt performed WC mobility 158ft using R hemi-technique with increased time and min A to avoid running into obstacles on R side. Performed the following exercises sitting in WC: -hip flexion x10 bilaterally (AAROM on L side) -LAQ x10 bilaterally (AAROM on L side) -bilateral hamstring and heel cord stretch x30 seconds  Pt transported back to room in [facility] total A. Concluded session with pt sitting in WC, needs within reach,  and seatbelt alarm on. RUE supported on pillow for improved hemibody positioning.   Therapy Documentation Precautions:  Precautions Precautions: Fall Precaution Comments: Significant Right sided hemi; intermittent right sided lean Restrictions Weight Bearing Restrictions: No  Therapy/Group: Individual Therapy Cuthbert Turton PT, DPT   08/12/2020, 7:21 AM

## 2020-08-12 NOTE — Progress Notes (Signed)
Therapist came to RN to report patient is diaphoretic during her session.  RN assessed vital signs.  The patients BP is 92/63, P 71, RR 18.  Patient denies dizziness.  RN assisted therapist in getting the patient back to bed.  Patient wants room extremely cool with no cover.  RN will reassess prior to lunch.

## 2020-08-12 NOTE — Progress Notes (Addendum)
[place] PHYSICAL MEDICINE & REHABILITATION PROGRESS NOTE  Subjective/Complaints: Pt is aphasic Appears to deny pain by nodding head no.  Said LBM was 2 days ago by signing- denies constipation.   Was hiccupping occ.  Said doing OK by thumbs up.     ROS: unable to assess due to aphasia  Objective: Vital Signs: Blood pressure 124/75, pulse 72, temperature 98.1 F (36.7 C), resp. rate 20, height 6\' 1"  (1.854 m), weight (!) 159.1 kg, SpO2 97 %. CT HEAD WO CONTRAST  Result Date: 08/11/2020 CLINICAL DATA:  Neuro deficit, acute stroke suspected. EXAM: CT HEAD WITHOUT CONTRAST TECHNIQUE: Contiguous axial images were obtained from the base of the skull through the vertex without intravenous contrast. COMPARISON:  MRI 07/25/2020. FINDINGS: Brain: Cortical and subcortical hypoattenuation involving the left frontal lobe, compatible with evolving left MCA territory infarct. No definite new/interval acute infarct. Mild areas of cortical hyperdensity may represent petechial hemorrhage. No mass occupying hemorrhagic transformation. No substantial mass effect. No midline shift. Basal cisterns are patent. Similar tiny focus of hypodensity within the left caudate head. Similar incidental small retrocerebellar arachnoid cyst. Vascular: Calcific atherosclerosis. Skull: No acute fracture. Sinuses/Orbits: Mild ethmoid air cell mucosal thickening. Otherwise clear sinuses. Unremarkable orbits. Other: No mastoid effusions. IMPRESSION: Evolving left MCA territory infarct. Mild areas of cortical hyperdensity may represent petechial hemorrhage. No mass occupying hemorrhagic transformation or substantial mass effect. No definite new/interval acute infarct, although MRI could provide more sensitive evaluation for extension of infarct. Electronically Signed   By: 07/27/2020 MD   On: 08/11/2020 14:57   Recent Labs    08/10/20 0625 08/12/20 0612  WBC 9.0 10.9*  HGB 14.8 15.2  HCT 40.8 44.5  PLT 213 225    Recent Labs    08/10/20 0625 08/12/20 0612  NA 134* 134*  K 4.2 4.2  CL 98 97*  CO2 24 24  GLUCOSE 152* 154*  BUN 35* 47*  CREATININE 1.33* 1.57*  CALCIUM 9.8 10.2    Intake/Output Summary (Last 24 hours) at 08/12/2020 0917 Last data filed at 08/12/2020 0753 Gross per 24 hour  Intake 800 ml  Output --  Net 800 ml     Pressure Injury 08/09/20 Coccyx Mid Stage 2 -  Partial thickness loss of dermis presenting as a shallow open injury with a red, pink wound bed without slough. (Active)  08/09/20 1555  Location: Coccyx  Location Orientation: Mid  Staging: Stage 2 -  Partial thickness loss of dermis presenting as a shallow open injury with a red, pink wound bed without slough.  Wound Description (Comments):   Present on Admission: Yes     Pressure Injury 08/09/20 Buttocks Left;Right Deep Tissue Pressure Injury - Purple or maroon localized area of discolored intact skin or blood-filled blister due to damage of underlying soft tissue from pressure and/or shear. (Active)  08/09/20 1716  Location: Buttocks  Location Orientation: Left;Right  Staging: Deep Tissue Pressure Injury - Purple or maroon localized area of discolored intact skin or blood-filled blister due to damage of underlying soft tissue from pressure and/or shear.  Wound Description (Comments):   Present on Admission: Yes    Physical Exam: BP 124/75 (BP Location: Left Arm)   Pulse 72   Temp 98.1 F (36.7 C)   Resp 20   Ht 6\' 1"  (1.854 m)   Wt (!) 159.1 kg   SpO2 97%   BMI 46.28 kg/m  Constitutional: BMI 46- awake, alert, aphasic, sitting up in bed; doing thumbs up and  down, NAD HENT: Normocephalic.  Atraumatic. Eyes: EOMI. No discharge. Cardiovascular:RRR- no JVD Respiratory: CTA B/L- no W/R/R- good air movement GI: Soft, NT, ND, (+)BS -hypoactive Skin: Warm and dry.  Intact. Psych: appropriate, but aphasic Musc: No edema in extremities.  No tenderness in extremities. Neuro: Alert Expressive aphasia,  unchanged Nonverbal- doing thumbs up and down Motor: RUE/RLE: 0/5 proximal distal, stable No increase in tone noted LUE/LLE: 5/5 proximal to distal   Assessment/Plan: 1. Functional deficits which require 3+ hours per day of interdisciplinary therapy in a comprehensive inpatient rehab setting.  Physiatrist is providing close team supervision and 24 hour management of active medical problems listed below.  Physiatrist and rehab team continue to assess barriers to discharge/monitor patient progress toward functional and medical goals   Care Tool:  Bathing    Body parts bathed by patient: Right arm,Chest,Abdomen,Front perineal area,Right upper leg,Left upper leg,Face         Bathing assist Assist Level: 2 Helpers     Upper Body Dressing/Undressing Upper body dressing   What is the patient wearing?: Hospital gown only    Upper body assist Assist Level: Minimal Assistance - Patient > 75%    Lower Body Dressing/Undressing Lower body dressing      What is the patient wearing?: Incontinence brief     Lower body assist Assist for lower body dressing: Dependent - Patient 0%     Toileting Toileting    Toileting assist Assist for toileting: 2 Helpers Assistive Device Comment: Bedrails   Transfers Chair/bed transfer  Transfers assist  Chair/bed transfer activity did not occur: Safety/medical concerns  Chair/bed transfer assist level: Dependent - mechanical lift     Locomotion Ambulation   Ambulation assist   Ambulation activity did not occur: Safety/medical concerns          Walk 10 feet activity   Assist  Walk 10 feet activity did not occur: Safety/medical concerns        Walk 50 feet activity   Assist Walk 50 feet with 2 turns activity did not occur: Safety/medical concerns         Walk 150 feet activity   Assist Walk 150 feet activity did not occur: Safety/medical concerns         Walk 10 feet on uneven surface   activity   Assist Walk 10 feet on uneven surfaces activity did not occur: Safety/medical concerns         Wheelchair     Assist Will patient use wheelchair at discharge?: Yes Type of Wheelchair: Manual    Wheelchair assist level: Minimal Assistance - Patient > 75% Max wheelchair distance: 100    Wheelchair 50 feet with 2 turns activity    Assist        Assist Level: Minimal Assistance - Patient > 75%   Wheelchair 150 feet activity     Assist      Assist Level: Moderate Assistance - Patient 50 - 74%    Medical Problem List and Plan: 1.  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  Continue CIR  WHO/PRAFO ordered 2.  Antithrombotics: -DVT/anticoagulation:  Pharmaceutical: Lovenox  Dopplers negative for DVT             -antiplatelet therapy: DAPT X 3 months followed by ASA alone--Plavix added on 08/09/20 3. Pain Management: Mobic DC'd  Monitor with increased exertion 4. Mood: LCSW to follow for evaluation and support.              -antipsychotic agents: N/A 5. Neuropsych: This patient is not capable of making decisions on his own behalf. 6. Skin/Wound Care: Routine pressure relief measures.  7. Fluids/Electrolytes/Nutrition: Monitor I/Os.  8. HTN: Monitor BP tid--continue Catapres twice daily, Avapro, Lopressor bid.   HCTZ daily, DC'd on 2/7  Labile on 2/6             Monitor with increased mobility, labile on 2/5  2/7- BP doing better- 110s/120s systolic- con't regimen 9. T2DM with hyperglycemia--controlled: Hgb A1c-6.2. Now on metformin 1000 mg bid--resume [place].   2/7- BGs 126-160s- slightly elevated- con't regimen             Monitor with increased mobility 10. Covid 19--Positive  on 07/24/20-->no treatment as asymptomatic. Continue zinc and vitamin C.  11. Morbid obesity: BMI 46. Educate on appropriate  diet and exercise to help promote health and mobility.              Encourage weight loss 12.  Hyperlipidemia: Crestor 13. H/o gout: continue allopurinol.  14. Hyponatremia  Na+ 134 on 2/5  Continue to monitor 15. AKI  Cr. 1.33 on 2/5, labs ordered for tomorrow  2/7- Cr up to 1.57 and BUN up to 47 from 33- on regular consistently diet with thin liquids- will give some IVFs since so increased; NS 75cc/hr x 1 days and recheck in AM  Encourage fluids 16. Transaminitis  ALT elevated on 2/5, continue to monitor  2/7- ALT slightly elevated at 61- will con't to monitor 17. Mild leukocytosis  2/7- will recheck in AM and check U/A and Cx since sounds clear, don't think need CXR right now- WBC 10.9-   LOS: 3 days A FACE TO FACE EVALUATION WAS PERFORMED  Cardarius Senat 08/12/2020, 9:17 AM

## 2020-08-13 DIAGNOSIS — N390 Urinary tract infection, site not specified: Secondary | ICD-10-CM

## 2020-08-13 LAB — URINALYSIS, ROUTINE W REFLEX MICROSCOPIC
Bilirubin Urine: NEGATIVE
Glucose, UA: >=500 mg/dL — AB
Hgb urine dipstick: NEGATIVE
Ketones, ur: NEGATIVE mg/dL
Nitrite: POSITIVE — AB
Protein, ur: NEGATIVE mg/dL
Specific Gravity, Urine: 1.018 (ref 1.005–1.030)
pH: 5 (ref 5.0–8.0)

## 2020-08-13 LAB — BASIC METABOLIC PANEL
Anion gap: 15 (ref 5–15)
BUN: 51 mg/dL — ABNORMAL HIGH (ref 6–20)
CO2: 22 mmol/L (ref 22–32)
Calcium: 10 mg/dL (ref 8.9–10.3)
Chloride: 96 mmol/L — ABNORMAL LOW (ref 98–111)
Creatinine, Ser: 1.56 mg/dL — ABNORMAL HIGH (ref 0.61–1.24)
GFR, Estimated: 58 mL/min — ABNORMAL LOW (ref >60)
Glucose, Bld: 147 mg/dL — ABNORMAL HIGH (ref 70–99)
Potassium: 4.2 mmol/L (ref 3.5–5.1)
Sodium: 133 mmol/L — ABNORMAL LOW (ref 135–145)

## 2020-08-13 LAB — CBC WITH DIFFERENTIAL/PLATELET
Abs Immature Granulocytes: 0.07 10*3/uL (ref 0.00–0.07)
Basophils Absolute: 0 10*3/uL (ref 0.0–0.1)
Basophils Relative: 0 %
Eosinophils Absolute: 0.3 10*3/uL (ref 0.0–0.5)
Eosinophils Relative: 3 %
HCT: 43.6 % (ref 39.0–52.0)
Hemoglobin: 15 g/dL (ref 13.0–17.0)
Immature Granulocytes: 1 %
Lymphocytes Relative: 28 %
Lymphs Abs: 3.1 10*3/uL (ref 0.7–4.0)
MCH: 30.2 pg (ref 26.0–34.0)
MCHC: 34.4 g/dL (ref 30.0–36.0)
MCV: 87.9 fL (ref 80.0–100.0)
Monocytes Absolute: 1 10*3/uL (ref 0.1–1.0)
Monocytes Relative: 9 %
Neutro Abs: 6.5 10*3/uL (ref 1.7–7.7)
Neutrophils Relative %: 59 %
Platelets: 214 10*3/uL (ref 150–400)
RBC: 4.96 MIL/uL (ref 4.22–5.81)
RDW: 12.8 % (ref 11.5–15.5)
WBC: 10.9 10*3/uL — ABNORMAL HIGH (ref 4.0–10.5)
nRBC: 0 % (ref 0.0–0.2)

## 2020-08-13 LAB — GLUCOSE, CAPILLARY
Glucose-Capillary: 158 mg/dL — ABNORMAL HIGH (ref 70–99)
Glucose-Capillary: 144 mg/dL — ABNORMAL HIGH (ref 70–99)
Glucose-Capillary: 130 mg/dL — ABNORMAL HIGH (ref 70–99)
Glucose-Capillary: 156 mg/dL — ABNORMAL HIGH (ref 70–99)

## 2020-08-13 MED ORDER — SODIUM CHLORIDE 0.9 % IV SOLN: INTRAVENOUS | Status: AC

## 2020-08-13 MED ORDER — NITROFURANTOIN MONOHYD MACRO 100 MG PO CAPS
100.0000 mg | ORAL_CAPSULE | Freq: Two times a day (BID) | ORAL | Status: AC
  Administered 2020-08-13 – 2020-08-19 (×13): 100 mg via ORAL
  Filled 2020-08-13 (×13): qty 1

## 2020-08-13 MED ORDER — IRBESARTAN 75 MG PO TABS
150.0000 mg | ORAL_TABLET | Freq: Every day | ORAL | Status: DC
  Administered 2020-08-13 – 2020-09-06 (×25): 150 mg via ORAL
  Filled 2020-08-13 (×26): qty 2

## 2020-08-13 MED ORDER — ENOXAPARIN SODIUM 60 MG/0.6ML ~~LOC~~ SOLN
60.0000 mg | SUBCUTANEOUS | Status: DC
  Administered 2020-08-13 – 2020-08-15 (×3): 60 mg via SUBCUTANEOUS
  Filled 2020-08-13 (×3): qty 0.6

## 2020-08-13 NOTE — Progress Notes (Signed)
Occupational Therapy Session Note  Patient Details  Name: Justin Mason MRN: [id number] Date of Birth: [birth year]  Today's Date: 08/13/2020 OT Individual Time: 1304-1400 OT Individual Time Calculation (min): 56 min    Short Term Goals: Week 1:  OT Short Term Goal 1 (Week 1): Pt will complete sit<>stand at stedy with min assist in preperation for self care. OT Short Term Goal 2 (Week 1): Pt will complete UB dressing with mod assist using hemitechniques. OT Short Term Goal 3 (Week 1): Pt will complete toilet transfer with mod assist +2 using LRAD. OT Short Term Goal 4 (Week 1): Pt will bathe UB/LB with mod assist using long handled sponge.  Skilled Therapeutic Interventions/Progress Updates:    Treatment session with focus on functional transfers, bed mobility, trunk control, and RUE NMR.  Pt received upright in w/c with nurse tech present assessing vitals.  Therapist noted pt's pants were visibly soiled.  Transferred pt to bed via slide board with max +2 when transferring to hemi side.  Pt demonstrating difficulty weight shifting to L.  Engaged in lateral scoots along EOB in preparation for transfer to supine while continuing to focus on weight shifting as needed for transfers.  Max assist to lift BLE in to bed, pt with good UB control.  Engaged in rolling to complete perihygiene, pt demonstrating good sequencing and following directions when rolling.  Pt assisting by reaching across body with LUE and bending LLE to assist in rolling, requiring max +2 when rolling L with therapist positioning RUE and RLE.  Pt demonstrating good trunk control by bridging in supine to assist with clothing management.  Engaged in 3 additional bridges with focus on maintaining clearance for core strengthening.  Pt then positioned semi-reclined in bed and therapist engaged in RUE PROM with focus on shoulder mobility in all planes.  Pt with no c/o pain with full range, but sensation and proprioception impacted.  Therapist  obtained handout for improved positioning when in bed and posted above bed, as pt received with arm dangling over side of w/c upon arrival with no awareness.  Plan to locate half lap tray for improved positioning when upright.  Pt remained semi-reclined in bed with all needs in reach.  Therapy Documentation Precautions:  Precautions Precautions: Fall Precaution Comments: Significant Right sided hemi; intermittent right sided lean Restrictions Weight Bearing Restrictions: No General:   Vital Signs: Therapy Vitals Temp: 98 F (36.7 C) Pulse Rate: 77 Resp: 17 BP: 115/68 Patient Position (if appropriate): Sitting Oxygen Therapy SpO2: 100 % O2 Device: Room Air Pain:  pt with no c/o pain   Therapy/Group: Individual Therapy  Rosalio Loud 08/13/2020, 3:55 PM

## 2020-08-13 NOTE — Progress Notes (Signed)
Physical Therapy Session Note  Patient Details  Name: Justin Mason MRN: [id number] Date of Birth: [birth year]  Today's Date: 08/13/2020 PT Individual Time: 0900-0956 PT Individual Time Calculation (min): 56 min   Short Term Goals: Week 1:  PT Short Term Goal 1 (Week 1): Pt will perform bed mobility with mod assist PT Short Term Goal 2 (Week 1): Pt will trasnfer to and from [facility] with max assist of 1 without stedy PT Short Term Goal 3 (Week 1): Pt will remain in WC >2 hours between therapies  Skilled Therapeutic Interventions/Progress Updates:   Received pt supine in bed with RN present administering medications, pt remains non verbal but communicates through thumbs up/down gestures. Pt denied any pain during session. Session with emphasis on dressing, functional mobility/transfers, generalized strengthening, dynamic sitting balance/coordination, and improved activity tolerance. RN reported pt's BP was low this morning and could have contributed to the diaphoreis yesterday. BP: 97/67 this morning with RN and pt asymptomatic. RN reported pt's fiance was here last night and stated that pt sweats heavily at baseline. Removed bilateral heel dressings for RN to inspect and therapist assisted with reapplying dressings and donned bilateral ted hose and non-skid socks with total A. Re-checked BP in supine: 144/75 with adult BP cuff around L brachial artery, 110/47 with larger BP cuff on L brachial artery, 164/83 with adult cuff around L lower leg, and BP along R lower forearm with adult cuff: 152/85. RN provided BP medication. Checked to ensure brief was clean; pt rolled L and R with max A and use of bedrails and pt able to bridge slightly to remove soiled brief. Pt required total A for peri-care and max A +2 to don clean brief to position R hemi arm. Donned pants in supine with max A. Pt able to pull pants over hips on L side without assist but required total A on R side due to hemiparesis. Transferred  supine<>sitting EOB with max A and cues for logroll technique and RUE positioning and transferred bed<>WC via slideboard with mod A+2 with total A of 1 to place board with second person providing mod A for sitting balance when leaning to R. Pt required cues for hand placement on board and scooting technique. Concluded session with pt sitting in WC, needs within reach, and seatbelt alarm on.   Therapy Documentation Precautions:  Precautions Precautions: Fall Precaution Comments: Significant Right sided hemi; intermittent right sided lean Restrictions Weight Bearing Restrictions: No  Therapy/Group: Individual Therapy [name] PT, DPT   08/13/2020, 7:20 AM

## 2020-08-13 NOTE — Progress Notes (Signed)
[place] PHYSICAL MEDICINE & REHABILITATION PROGRESS NOTE  Subjective/Complaints: Patient seen laying in bed this morning.  He indicates he slept well overnight.  No reported issues overnight.  Appears to have appropriate thumbs up/thumbs down and head nods.  ROS: unable to assess due to expressive aphasia, but appears denies CP, shortness of breath, nausea, vomiting, diarrhea  Objective: Vital Signs: Blood pressure 97/67, pulse 70, temperature 98.2 F (36.8 C), temperature source Oral, resp. rate 18, height 6\' 1"  (1.854 m), weight (!) 159.1 kg, SpO2 98 %. CT HEAD WO CONTRAST  Result Date: 08/11/2020 CLINICAL DATA:  Neuro deficit, acute stroke suspected. EXAM: CT HEAD WITHOUT CONTRAST TECHNIQUE: Contiguous axial images were obtained from the base of the skull through the vertex without intravenous contrast. COMPARISON:  MRI 07/25/2020. FINDINGS: Brain: Cortical and subcortical hypoattenuation involving the left frontal lobe, compatible with evolving left MCA territory infarct. No definite new/interval acute infarct. Mild areas of cortical hyperdensity may represent petechial hemorrhage. No mass occupying hemorrhagic transformation. No substantial mass effect. No midline shift. Basal cisterns are patent. Similar tiny focus of hypodensity within the left caudate head. Similar incidental small retrocerebellar arachnoid cyst. Vascular: Calcific atherosclerosis. Skull: No acute fracture. Sinuses/Orbits: Mild ethmoid air cell mucosal thickening. Otherwise clear sinuses. Unremarkable orbits. Other: No mastoid effusions. IMPRESSION: Evolving left MCA territory infarct. Mild areas of cortical hyperdensity may represent petechial hemorrhage. No mass occupying hemorrhagic transformation or substantial mass effect. No definite new/interval acute infarct, although MRI could provide more sensitive evaluation for extension of infarct. Electronically Signed   By: 07/27/2020 MD   On: 08/11/2020 14:57   Recent  Labs    08/12/20 0612 08/13/20 0516  WBC 10.9* 10.9*  HGB 15.2 15.0  HCT 44.5 43.6  PLT 225 214   Recent Labs    08/12/20 0612 08/13/20 0516  NA 134* 133*  K 4.2 4.2  CL 97* 96*  CO2 24 22  GLUCOSE 154* 147*  BUN 47* 51*  CREATININE 1.57* 1.56*  CALCIUM 10.2 10.0    Intake/Output Summary (Last 24 hours) at 08/13/2020 0825 Last data filed at 08/12/2020 2153 Gross per 24 hour  Intake 580 ml  Output --  Net 580 ml     Pressure Injury 08/09/20 Coccyx Mid Stage 2 -  Partial thickness loss of dermis presenting as a shallow open injury with a red, pink wound bed without slough. (Active)  08/09/20 1555  Location: Coccyx  Location Orientation: Mid  Staging: Stage 2 -  Partial thickness loss of dermis presenting as a shallow open injury with a red, pink wound bed without slough.  Wound Description (Comments):   Present on Admission: Yes     Pressure Injury 08/09/20 Buttocks Left;Right Deep Tissue Pressure Injury - Purple or maroon localized area of discolored intact skin or blood-filled blister due to damage of underlying soft tissue from pressure and/or shear. (Active)  08/09/20 1716  Location: Buttocks  Location Orientation: Left;Right  Staging: Deep Tissue Pressure Injury - Purple or maroon localized area of discolored intact skin or blood-filled blister due to damage of underlying soft tissue from pressure and/or shear.  Wound Description (Comments):   Present on Admission: Yes    Physical Exam: BP 97/67 (BP Location: Left Arm)   Pulse 70   Temp 98.2 F (36.8 C) (Oral)   Resp 18   Ht 6\' 1"  (1.854 m)   Wt (!) 159.1 kg   SpO2 98%   BMI 46.28 kg/m  Constitutional: No distress . Vital signs  reviewed. HENT: Normocephalic.  Atraumatic. Eyes: EOMI. No discharge. Cardiovascular: No JVD.  RRR. Respiratory: Normal effort.  No stridor.  Bilateral clear to auscultation. GI: Non-distended.  BS +. Skin: Warm and dry.  Intact. Psych: Mood and behavior appear to be  normal. Musc: No edema in extremities.  No tenderness in extremities. Neuro: Alert Expressive aphasia, stable Nonverbal, unchanged  Motor: RUE/RLE: 0/5 proximal distal, unchanged No increase in tone noted  Assessment/Plan: 1. Functional deficits which require 3+ hours per day of interdisciplinary therapy in a comprehensive inpatient rehab setting.  Physiatrist is providing close team supervision and 24 hour management of active medical problems listed below.  Physiatrist and rehab team continue to assess barriers to discharge/monitor patient progress toward functional and medical goals   Care Tool:  Bathing    Body parts bathed by patient: Right arm,Chest,Abdomen,Front perineal area,Right upper leg,Left upper leg,Face         Bathing assist Assist Level: 2 Helpers     Upper Body Dressing/Undressing Upper body dressing   What is the patient wearing?: Hospital gown only    Upper body assist Assist Level: Minimal Assistance - Patient > 75%    Lower Body Dressing/Undressing Lower body dressing      What is the patient wearing?: Incontinence brief,Pants     Lower body assist Assist for lower body dressing: 2 Helpers     Toileting Toileting    Toileting assist Assist for toileting: 2 Helpers Assistive Device Comment: Bedrails   Transfers Chair/bed transfer  Transfers assist  Chair/bed transfer activity did not occur: Safety/medical concerns  Chair/bed transfer assist level: Dependent - mechanical lift     Locomotion Ambulation   Ambulation assist   Ambulation activity did not occur: Safety/medical concerns          Walk 10 feet activity   Assist  Walk 10 feet activity did not occur: Safety/medical concerns        Walk 50 feet activity   Assist Walk 50 feet with 2 turns activity did not occur: Safety/medical concerns         Walk 150 feet activity   Assist Walk 150 feet activity did not occur: Safety/medical concerns          Walk 10 feet on uneven surface  activity   Assist Walk 10 feet on uneven surfaces activity did not occur: Safety/medical concerns         Wheelchair     Assist Will patient use wheelchair at discharge?: Yes Type of Wheelchair: Manual    Wheelchair assist level: Minimal Assistance - Patient > 75% Max wheelchair distance: 100    Wheelchair 50 feet with 2 turns activity    Assist        Assist Level: Minimal Assistance - Patient > 75%   Wheelchair 150 feet activity     Assist      Assist Level: Moderate Assistance - Patient 50 - 74%    Medical Problem List and Plan: 1.  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  Continue CIR  WHO/PRAFO nightly 2.  Antithrombotics: -DVT/anticoagulation:  Pharmaceutical: Lovenox  Dopplers negative for DVT             -antiplatelet therapy: DAPT X 3 months followed by ASA alone--Plavix added on 08/09/20 3. Pain Management: Mobic DC'd             Monitor with increased exertion  4. Mood: LCSW to follow for evaluation and support.              -antipsychotic agents: N/A 5. Neuropsych: This patient is not capable of making decisions on his own behalf. 6. Skin/Wound Care: Routine pressure relief measures.  7. Fluids/Electrolytes/Nutrition: Monitor I/Os.  8. HTN: Monitor BP tid--continue Catapres twice daily, Lopressor bid.   HCTZ daily, DC'd on 2/7  Avapro decreased to 150 on 2/8             Monitor with increased mobility 9. T2DM with hyperglycemia: Hgb A1c-6.2. Now on metformin 1000 mg bid--resume [place].   Remains elevated on 2/8, will consider initiating insulin if persistent  Monitor with increased mobility 10. Covid 19--Positive  on 07/24/20-->no treatment as asymptomatic. Continue zinc and vitamin C.  11. Morbid obesity: BMI 46. Educate on appropriate diet and exercise to help  promote health and mobility.              Encourage weight loss 12.  Hyperlipidemia: Crestor 13. H/o gout: continue allopurinol.  14. Hyponatremia  Na+ 133 on 2/8  Continue to monitor 15. AKI  Cr.  1.56 on 2/8   Received IVF overnight on 2/8, increased rate and changed to nightly x3 nights  Echo reviewed-EF 60-65%  Encourage fluids 16. Transaminitis  ALT elevated on 2/5, continue to monitor  2/7- ALT slightly elevated at 61- will con't to monitor 17. Mild leukocytosis  WBCs 10.9 on 2/8  Afebrile  Continue to monitor  See #18 18.  Acute lower UTI  UA +, urine culture pending  Empiric Macrobid started on 2/8  LOS: 4 days A FACE TO FACE EVALUATION WAS PERFORMED  Justin Mason Juba 08/13/2020, 8:25 AM

## 2020-08-14 LAB — GLUCOSE, CAPILLARY
Glucose-Capillary: 135 mg/dL — ABNORMAL HIGH (ref 70–99)
Glucose-Capillary: 174 mg/dL — ABNORMAL HIGH (ref 70–99)
Glucose-Capillary: 131 mg/dL — ABNORMAL HIGH (ref 70–99)
Glucose-Capillary: 130 mg/dL — ABNORMAL HIGH (ref 70–99)

## 2020-08-14 NOTE — Progress Notes (Addendum)
[place] PHYSICAL MEDICINE & REHABILITATION PROGRESS NOTE  Subjective/Complaints: Patient seen laying in bed this morning eating breakfast with supervision.  Indicates he slept well overnight.  No reported issues overnight.  ROS: unable to assess due to expressive aphasia, but appears to deny CP, shortness of breath, nausea, vomiting, diarrhea  Objective: Vital Signs: Blood pressure 116/65, pulse 65, temperature 97.6 F (36.4 C), temperature source Oral, resp. rate 20, height 6\' 1"  (1.854 m), weight (!) 159.1 kg, SpO2 98 %. No results found. Recent Labs    08/12/20 0612 08/13/20 0516  WBC 10.9* 10.9*  HGB 15.2 15.0  HCT 44.5 43.6  PLT 225 214   Recent Labs    08/12/20 0612 08/13/20 0516  NA 134* 133*  K 4.2 4.2  CL 97* 96*  CO2 24 22  GLUCOSE 154* 147*  BUN 47* 51*  CREATININE 1.57* 1.56*  CALCIUM 10.2 10.0    Intake/Output Summary (Last 24 hours) at 08/14/2020 0917 Last data filed at 08/14/2020 10/12/2020 Gross per 24 hour  Intake 1268.33 ml  Output -  Net 1268.33 ml     Pressure Injury 08/09/20 Coccyx Mid Stage 2 -  Partial thickness loss of dermis presenting as a shallow open injury with a red, pink wound bed without slough. (Active)  08/09/20 1555  Location: Coccyx  Location Orientation: Mid  Staging: Stage 2 -  Partial thickness loss of dermis presenting as a shallow open injury with a red, pink wound bed without slough.  Wound Description (Comments):   Present on Admission: Yes     Pressure Injury 08/09/20 Buttocks Left;Right Deep Tissue Pressure Injury - Purple or maroon localized area of discolored intact skin or blood-filled blister due to damage of underlying soft tissue from pressure and/or shear. (Active)  08/09/20 1716  Location: Buttocks  Location Orientation: Left;Right  Staging: Deep Tissue Pressure Injury - Purple or maroon localized area of discolored intact skin or blood-filled blister due to damage of underlying soft tissue from pressure and/or  shear.  Wound Description (Comments):   Present on Admission: Yes    Physical Exam: BP 116/65 (BP Location: Left Arm)   Pulse 65   Temp 97.6 F (36.4 C) (Oral)   Resp 20   Ht 6\' 1"  (1.854 m)   Wt (!) 159.1 kg   SpO2 98%   BMI 46.28 kg/m  Constitutional: No distress . Vital signs reviewed.  Morbidly obese. HENT: Normocephalic.  Atraumatic. Eyes: EOMI. No discharge. Cardiovascular: No JVD.  RRR. Respiratory: Normal effort.  No stridor.  Bilateral clear to auscultation. GI: Non-distended.  BS +. Skin: Warm and dry.  Intact. Psych: Mood and behavior appear to be normal. Musc: No edema in extremities.  No tenderness in extremities. Neuro: Alert Expressive aphasia, unchanged Nonverbal, unchanged  Motor: RUE 0/5 proximal distal RLE: 1+/5 proximal to distal No increase in tone noted  Assessment/Plan: 1. Functional deficits which require 3+ hours per day of interdisciplinary therapy in a comprehensive inpatient rehab setting.  Physiatrist is providing close team supervision and 24 hour management of active medical problems listed below.  Physiatrist and rehab team continue to assess barriers to discharge/monitor patient progress toward functional and medical goals   Care Tool:  Bathing    Body parts bathed by patient: Right arm,Chest,Abdomen,Front perineal area,Right upper leg,Left upper leg,Face         Bathing assist Assist Level: 2 Helpers     Upper Body Dressing/Undressing Upper body dressing   What is the patient wearing?: Hospital gown  only    Upper body assist Assist Level: Minimal Assistance - Patient > 75%    Lower Body Dressing/Undressing Lower body dressing      What is the patient wearing?: Incontinence brief,Pants     Lower body assist Assist for lower body dressing: 2 Helpers     Toileting Toileting    Toileting assist Assist for toileting: 2 Helpers Assistive Device Comment: Bedrails   Transfers Chair/bed transfer  Transfers assist   Chair/bed transfer activity did not occur: Safety/medical concerns  Chair/bed transfer assist level: 2 Helpers     Locomotion Ambulation   Ambulation assist   Ambulation activity did not occur: Safety/medical concerns          Walk 10 feet activity   Assist  Walk 10 feet activity did not occur: Safety/medical concerns        Walk 50 feet activity   Assist Walk 50 feet with 2 turns activity did not occur: Safety/medical concerns         Walk 150 feet activity   Assist Walk 150 feet activity did not occur: Safety/medical concerns         Walk 10 feet on uneven surface  activity   Assist Walk 10 feet on uneven surfaces activity did not occur: Safety/medical concerns         Wheelchair     Assist Will patient use wheelchair at discharge?: Yes Type of Wheelchair: Manual    Wheelchair assist level: Minimal Assistance - Patient > 75% Max wheelchair distance: 100    Wheelchair 50 feet with 2 turns activity    Assist        Assist Level: Minimal Assistance - Patient > 75%   Wheelchair 150 feet activity     Assist      Assist Level: Moderate Assistance - Patient 50 - 74%    Medical Problem List and Plan: 1.  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  Continue CIR  WHO/PRAFO nightly  Discussed with mother via telephone current medical/functional status and progress  Team conference today to discuss current and goals and coordination of care, home and environmental barriers, and discharge planning with nursing, case manager, and therapies. Please see conference note from today as well.  2.  Antithrombotics: -DVT/anticoagulation:  Pharmaceutical: Lovenox  Dopplers negative for DVT             -antiplatelet therapy: DAPT X 3 months followed by ASA alone--Plavix added on 08/09/20 3. Pain Management:  Mobic DC'd             Monitor with increased exertion 4. Mood: LCSW to follow for evaluation and support.              -antipsychotic agents: N/A 5. Neuropsych: This patient is not capable of making decisions on his own behalf. 6. Skin/Wound Care: Routine pressure relief measures.  7. Fluids/Electrolytes/Nutrition: Monitor I/Os.  8. HTN: Monitor BP tid--continue Catapres twice daily, Lopressor bid.   HCTZ daily, DC'd on 2/7  Avapro decreased to 150 on 2/8             Monitor with increased mobility 9. T2DM with hyperglycemia: Hgb A1c-6.2.   Metformin 1000 mg bid  Marcelline Deist put on hold due to UTI.   Remains elevated on 2/9, will consider initiating insulin if persistent  Monitor with increased mobility 10. Covid 19--Positive  on 07/24/20-->no treatment as asymptomatic. Continue  zinc and vitamin C.  11. Morbid obesity: BMI 46. Educate on appropriate diet and exercise to help promote health and mobility.              Encourage weight loss 12.  Hyperlipidemia: Crestor 13. H/o gout: continue allopurinol.  14. Hyponatremia  Na+ 133 on 2/8  Continue to monitor 15. AKI  Cr.  1.56 on 2/8   Received IVF overnight on 2/8, increased rate and changed to nightly x 2 more nights  Echo reviewed-EF 60-65%  Encourage fluids 16. Transaminitis  ALT elevated on 2/5, continue to monitor 17. Mild leukocytosis  WBCs 10.9 on 2/8  Afebrile  Continue to monitor  See #18 18.  Acute lower UTI  UA +, urine culture showing gram-negative rods, sensitivities pending  Empiric Macrobid started on 2/8  LOS: 5 days A FACE TO FACE EVALUATION WAS PERFORMED  Ankit Karis Juba 08/14/2020, 9:17 AM

## 2020-08-14 NOTE — Progress Notes (Signed)
Physical Therapy Session Note  Patient Details  Name: Justin Mason MRN: [id number] Date of Birth: 05-Jun-1981  Today's Date: 08/14/2020 PT Individual Time: 1119-1205 PT Individual Time Calculation (min): 46 min   Short Term Goals: Week 1:  PT Short Term Goal 1 (Week 1): Pt will perform bed mobility with mod assist PT Short Term Goal 2 (Week 1): Pt will trasnfer to and from [facility] with max assist of 1 without stedy PT Short Term Goal 3 (Week 1): Pt will remain in WC >2 hours between therapies  Skilled Therapeutic Interventions/Progress Updates:    Patient in supine noted sheets soiled wet under him.  NT entered room to check CBG.  She assisted with pt in supine performing rolling to R with min A and L with max A to change linen, shorts, brief and for hygiene +2 A.  Patient supine to sit through R sidelying max A for R leg off bed and to lift trunk. Patient performed slide board transfer to w/c to L with mod A +2 for safety.  Patient assisted in w/c to rail outside gym.  Performed sit to stand initially max A+1 with two attempts progressing to mod A of 1 after cues for anterior weight shift.  Standing weight shifts with A for R hip and knee extension and facilitation for trunk extension.  Performed x 3 bouts of about 60-90 seconds each.  On third bout patient performed L foot advancement then retraction sliding foot on floor.  Demonstrated improved awareness and no episodes of knee buckling on last bout.  Stand to sit last time with cues for reaching to armrest on L and controlled sit.  Patient assisted in w/c to room and requested back to bed.  Slide board transfer mod A back to bed with +2 for safety.  Sit to supine with mod A for R LE and positioning.  Left with call bell in reach and bed alarm on.  Therapy Documentation Precautions:  Precautions Precautions: Fall Precaution Comments: Significant Right sided hemi; intermittent right sided lean Restrictions Weight Bearing Restrictions:  No Pain: Pain Assessment Pain Scale: 0-10 Pain Score: 0-No pain   Therapy/Group: Individual Therapy  Elray Mcgregor  Sheran Lawless, PT 08/14/2020, 12:40 PM

## 2020-08-14 NOTE — Patient Care Conference (Signed)
Inpatient RehabilitationTeam Conference and Plan of Care Update Date: 08/14/2020   Time: 11:58 AM    Patient Name: Justin Mason      Medical Record Number: [id number]  Date of Birth: May 28, 1981 Sex: Male         Room/Bed: 4W02C/4W02C-01 Payor Info: Payor: [employer] / Plan: C[employer] MANAGED / Product Type: *No Product type* /    Admit Date/Time:  08/09/2020  3:32 PM  Primary Diagnosis:  Acute cerebral infarction [facility])  Hospital Problems: Principal Problem:   Acute cerebral infarction (HCC) Active Problems:   Pressure injury of skin   Hyponatremia   AKI (acute kidney injury) (HCC)   Transaminitis   Benign essential HTN   Labile blood pressure   Right hemiplegia (HCC)   Acute lower UTI    Expected Discharge Date: Expected Discharge Date:  (ELOS 3.5 - 4 weeks)  Team Members Present: Physician leading conference: Dr. Maryla Morrow Nurse Present: Other (comment) Rae Lips, RN) SLP Present: Suzzette Righter, CF-SLP PPS Coordinator present : Fae Pippin, SLP     Current Status/Progress Goal Weekly Team Focus  Bowel/Bladder   Pt is incontinent x2.  Pt will regain continence.  Assess q shift and PRN.   Swallow/Nutrition/ Hydration   Regular textures with thin lqiuids, Mod A  Supervision  use of swallow strategies, continued onoing assessment of tolerance   ADL's   max-total +2 LB bathing and dressing at bed level, max assist UB dressing, mod assist +2 sit> stand - pt with very heavy R lean and R knee instability in standing  Min A bathing and UB dressing; Mod A LB dressing, toileting and toilet transfers  ADL retraining, sit > stand, R NMR, transfers, activity tolerance   Mobility   bed mobility max A, transfers with Stedy max A, WC mobility 128ft with min A  mod A transfers. Min assist bed mobility  functional mobility/transfers, generalized strengthening, dynamic standing balance/coordination, gait training, improved activity tolerance.   Communication             Safety/Cognition/  Behavioral Observations  nonverbal, unable to vocal on command, uses gestures for yes/no responses, mildly impaired receptive language with complex directions  Min A  vocalizing on command, multimodal communication   Pain   Pt denies pain at this time.  Pain will remain <3.  Assess q shift and prn.   Skin   Pt has MASD and stg II on coccyx.  Wounds will continue to heal and remain infection free.  Assess q shift and prn.     Discharge Planning:  Home to Mom's home where Mom, siblings and girlfriend will be assisting. Informed MOm he will require 24/7 physical care at discharge. Mom has health issues of her own and can not physically assist   Team Discussion: Staff noted increased aphasia and swallowing issues over weekend; CT confirmed evolving CBA per MD. BP low and CBGs rising; MD to adjust medications. Stage 2 on coccyx, UTI treated and patient is continent of bowel and bladder. Patient on target to meet rehab goals: yes, currently min assist for upper body bathing/dressing, max-total assist for lower body care. Mod assist +2 for transfers. Noted right lateral lean and poor control of right knee with activity. Goals set for min assist with mod assist gait.  *See Care Plan and progress notes for long and short-term goals.   Revisions to Treatment Plan:   Teaching Needs: Transfers, toileting, medications, secondary stroke risk management, etc.   Current Barriers to Discharge: Decreased  caregiver support and Insurance for SNF coverage  Possible Resolutions to Barriers: Family education     Medical Summary Current Status: Right dense hemiplegia with right inattention, severe right lean, aphasia with apraxia and dysgraphia affecting communication, mobility and ADLs secondary to left M1 infarct.  Barriers to Discharge: Behavior;Medical stability;Weight;Other (comments)  Barriers to Discharge Comments: Aphasia Possible Resolutions to Barriers/Weekly Focus: Therapies, abx for UTI,  follow labs - Cr, optimize DM/BP meds   Continued Need for Acute Rehabilitation Level of Care: The patient requires daily medical management by a physician with specialized training in physical medicine and rehabilitation for the following reasons: Direction of a multidisciplinary physical rehabilitation program to maximize functional independence : Yes Medical management of patient stability for increased activity during participation in an intensive rehabilitation regime.: Yes Analysis of laboratory values and/or radiology reports with any subsequent need for medication adjustment and/or medical intervention. : Yes   I attest that I was present, lead the team conference, and concur with the assessment and plan of the team.   Pamelia Hoit 08/14/2020, 2:23 PM

## 2020-08-14 NOTE — Progress Notes (Signed)
Physical Therapy Session Note  Patient Details  Name: Justin Mason MRN: [id number] Date of Birth: 07-07-1980  Today's Date: 08/14/2020 PT Individual Time: 1000-1044 PT Individual Time Calculation (min): 44 min   Short Term Goals: Week 1:  PT Short Term Goal 1 (Week 1): Pt will perform bed mobility with mod assist PT Short Term Goal 2 (Week 1): Pt will trasnfer to and from [facility] with max assist of 1 without stedy PT Short Term Goal 3 (Week 1): Pt will remain in WC >2 hours between therapies  Skilled Therapeutic Interventions/Progress Updates:   Received pt sitting in WC, pt remains non-verbal and communicates appropriately through thumbs up/down gestures. Session with emphasis on functional mobility/transfers, generalized strengthening, dynamic standing balance/coordination, NMR, motor control, and improved activity tolerance. Pt transported to therapy gym in [facility] total A for time management purposes and transferred WC<>mat via slideboard to L side with min/mod A +2 with total A to place board. Pt required cues for hand placement on board, head/hips relationship, and scooting technique. Pt transferred sit<>stand x 3 trials with mod A +2 using 3 musketeer technique while blocking R knee and worked on midline orientation, upright posture/gaze, and lateral weight shifting using mirror for visual feedback with emphasis on R knee extension. Therapist blocking R knee ~75% of the time but pt able to briefly extend knee without assist. Pt with increased difficulty weight shifting to R and with moderate R lateral lean in standing. Pt diaphoretic with standing activities but denied any discomfort. Therapist provided sweat towel for pt during session. Worked on lateral scooting to L with CGA and pt transferred mat<>WC via slideboard with mod A of 1 but +2 stabilizing WC for safety. Pt performed WC mobility 26ft using R hemi technique and min A and transported back to room remainder of way total A. Pt requested to  return to bed and transferred WC<>bed via slideboard with mod A +2 for safety and sit<>supine with max A for RLE and RUE management. Concluded session with pt semi-reclined in bed with RN present attending to care.   Therapy Documentation Precautions:  Precautions Precautions: Fall Precaution Comments: Significant Right sided hemi; intermittent right sided lean Restrictions Weight Bearing Restrictions: No  Therapy/Group: Individual Therapy Qamar Aughenbaugh PT, DPT   08/14/2020, 7:22 AM

## 2020-08-14 NOTE — Progress Notes (Signed)
Patient ID: Justin Mason, male   DOB: 05-28-1981, 40 y.o.   MRN: [id number]  Met with pt and spoke with Mom via telephone to discuss team conference goals min-mod level of assist. Made aware he will need 24/7 physical care at discharge. She was asking good questions regarding need for ramp, hospital bed, wheelchair. Discussed he would need a ramp into the home due to four steps into and would be easier to get into home. She did talk with the MD yesterday and got her update. Will continue to work on discharge needs and have a target discharge date next week at conference.

## 2020-08-14 NOTE — Progress Notes (Signed)
Speech Language Pathology Daily Session Note  Patient Details  Name: Justin Mason MRN: [id number] Date of Birth: Nov 26, 1980  Today's Date: 08/14/2020 SLP Individual Time: 1345-1430 SLP Individual Time Calculation (min): 45 min  Short Term Goals: Week 1: SLP Short Term Goal 1 (Week 1): Pt will follow multistep commands during basic and familiar tasks in 75% of opportunities with min verbal cues SLP Short Term Goal 2 (Week 1): Pt will answer simple-compex yes/no questions with multimodal responses (head nod/shake, thumbs up/down, verbalizations) with 90% accuracy given min A verbal cues SLP Short Term Goal 3 (Week 1): Pt will vocalize on command in 50% of opportunities with Mod A multimodal cues SLP Short Term Goal 4 (Week 1): Pt will consume regular/thin diet with Min A cues to utilize compensatory swallow strategies  Skilled Therapeutic Interventions: Skilled treatment session focused on speech goals. Upon arrival, patient was asleep in bed but easily awakened. Patient able to vocalize on command X 4 when utilizing tension by pushing against the bed rails and cues to "envision trying to pick up something heavy and grunt." Patient's wife present towards end of session and educated on "tasks" they can work on together to focus on vocalizing like having patient hug her and "squeeze tight" in order to produce grunting noises. She verbalized understanding. Patient had an incontinent bowel movement at end of session and followed directions for peri care with Min verbal and tactile cues. Patient left upright in bed with alarm on and all needs within reach. Continue with current plan of care.      Pain Pain Assessment Pain Scale: 0-10 Pain Score: 0-No pain  Therapy/Group: Individual Therapy  Treyshawn Muldrew 08/14/2020, 3:41 PM

## 2020-08-14 NOTE — Progress Notes (Signed)
Occupational Therapy Session Note  Patient Details  Name: Justin Mason MRN: [id number] Date of Birth: 1980-09-12  Today's Date: 08/14/2020 OT Individual Time: 2725-3664 OT Individual Time Calculation (min): 70 min    Short Term Goals: Week 1:  OT Short Term Goal 1 (Week 1): Pt will complete sit<>stand at stedy with min assist in preperation for self care. OT Short Term Goal 2 (Week 1): Pt will complete UB dressing with mod assist using hemitechniques. OT Short Term Goal 3 (Week 1): Pt will complete toilet transfer with mod assist +2 using LRAD. OT Short Term Goal 4 (Week 1): Pt will bathe UB/LB with mod assist using long handled sponge.  Skilled Therapeutic Interventions/Progress Updates:    Treatment session with focus on self-care retraining, dynamic sitting balance, sit > stand, and slide board transfers.  Pt received semi-reclined in bed agreeable to therapy session.  Engaged in bathing with focus on increased participation during LB bathing with pt able to wash perineal area and LLE when positioned in chair position in bed.  Engaged in rolling with max assist and assist for positioning RLE in preparation for rolling.  +2 assist to maintain sidelying while therapist washed buttocks and completed perineal hygiene due to incontinence.  Educated pt on hemi-dressing technique with LB dressing.  Engaged in bridging at bed level to assist with LB dressing.  Pt able to pull pants over L hip but requires assistance on R side.  Pt able to advance RLE towards EOB and even bring leg to EOB in sidelying.  Mod assist +2 to come to sitting EOB.  Engaged in UB bathing and dressing at EOB, with therapist positioned on pt's R to assist with positioning of RUE.  Pt completed UB bathing with min assist to wash LUE and lift RUE to allow pt to wash underarm.  Pt doffed shirt prior to bathing with CGA and min cues.  Pt then donned shirt with mod assist to thread RUE and then pull shirt over trunk.  Completed slide  board transfer to w/c with mod assist +2 when transferring to strong side.  Engaged in sit > stand at sink x2 with mod assist +2.  Therapist providing blocking at R knee and facilitation at trunk due to R lean in standing.  Pt completed oral care seated at sink with setup to apply toothpaste.  Therapist obtained half lap tray for improved positioning and support of RUE.  Pt remained upright in w/c with seat belt alarm on and all needs in reach.  RN present to administer morning meds.  Therapy Documentation Precautions:  Precautions Precautions: Fall Precaution Comments: Significant Right sided hemi; intermittent right sided lean Restrictions Weight Bearing Restrictions: No General:   Vital Signs: Therapy Vitals BP: 127/68 Pain: Pain Assessment Pain Scale: 0-10 Pain Score: 0-No pain   Therapy/Group: Individual Therapy  Rosalio Loud 08/14/2020, 11:59 AM

## 2020-08-15 LAB — URINE CULTURE: Culture: >=100000 — AB

## 2020-08-15 LAB — GLUCOSE, CAPILLARY
Glucose-Capillary: 140 mg/dL — ABNORMAL HIGH (ref 70–99)
Glucose-Capillary: 156 mg/dL — ABNORMAL HIGH (ref 70–99)
Glucose-Capillary: 139 mg/dL — ABNORMAL HIGH (ref 70–99)
Glucose-Capillary: 166 mg/dL — ABNORMAL HIGH (ref 70–99)

## 2020-08-15 MED ORDER — ASPIRIN EC 81 MG PO TBEC
81.0000 mg | DELAYED_RELEASE_TABLET | Freq: Every day | ORAL | Status: DC
  Administered 2020-08-16 – 2020-09-06 (×22): 81 mg via ORAL
  Filled 2020-08-15 (×22): qty 1

## 2020-08-15 NOTE — Progress Notes (Signed)
Physical Therapy Session Note  Patient Details  Name: Justin Mason MRN: [id number] Date of Birth: March 25, 1981  Today's Date: 08/15/2020 PT Individual Time: 1345-1443 PT Individual Time Calculation (min): 58 min   Short Term Goals: Week 1:  PT Short Term Goal 1 (Week 1): Pt will perform bed mobility with mod assist PT Short Term Goal 2 (Week 1): Pt will trasnfer to and from [facility] with max assist of 1 without stedy PT Short Term Goal 3 (Week 1): Pt will remain in WC >2 hours between therapies  Skilled Therapeutic Interventions/Progress Updates:   Received pt sitting in [facility] with family, [name], present at bedside, pt remains non-verbal but communicates appropriately through thumbs up/down gestures. Pt had been sitting up since OT session this morning, however brief appeared clean. Discussed pressure relief strategies while sitting in WC. Session with emphasis on functional mobility/transfers, generalized strengthening, dynamic standing balance/coordination, NMR, motor control, and improved activity tolerance. Pt transported to therapy gym in [facility] total A for time management purposes and transferred sit<>stand at railing in hallway with heavy max A of 1 x 2 trials and pt holing onto L handrail using mirror for visual feedback. Worked on dynamic standing balance, weight shifting to R, upright posture/midline orientation, and reaching outside BOS taking horseshoes from L handrail and placing them on top of mirror with mod/max A for balance and therapist blocking R knee. Pt with tendency to push towards R when encouraged to weight shift to R leg. Pt transported to ortho gym in [facility] total A and transferred WC<>mat via slideboard with mod A of 1 and total A to place board. On mat worked on lateral scooting L and R for 19ft x 2 trials with total A to manage RLE and cues for anterior weight shifting. Pt transferred mat<>WC via slideboard with mod A and transported back to room in [facility] total A. Pt requested to return to bed  and transferred WC<>bed via slideboard with mod A of 1 to L with total A to place board. Doffed shoes with total A and pt transferred sit<>supine with max A for RUE/LE management. Pt required +2 assist to reposition in bed. Concluded session with pt supine in bed, needs within reach, and family present at bedside. R UE supported on pillow for improved positioning.   Therapy Documentation Precautions:  Precautions Precautions: Fall Precaution Comments: Significant Right sided hemi; intermittent right sided lean Restrictions Weight Bearing Restrictions: No  Therapy/Group: Individual Therapy Rajendra Spiller PT, DPT   08/15/2020, 7:21 AM

## 2020-08-15 NOTE — Progress Notes (Signed)
Speech Language Pathology Daily Session Note  Patient Details  Name: Justin Mason MRN: [id number] Date of Birth: July 12, 1980  Today's Date: 08/15/2020 SLP Individual Time: 1120-1215 SLP Individual Time Calculation (min): 55 min  Short Term Goals: Week 1: SLP Short Term Goal 1 (Week 1): Pt will follow multistep commands during basic and familiar tasks in 75% of opportunities with min verbal cues SLP Short Term Goal 2 (Week 1): Pt will answer simple-compex yes/no questions with multimodal responses (head nod/shake, thumbs up/down, verbalizations) with 90% accuracy given min A verbal cues SLP Short Term Goal 3 (Week 1): Pt will vocalize on command in 50% of opportunities with Mod A multimodal cues SLP Short Term Goal 4 (Week 1): Pt will consume regular/thin diet with Min A cues to utilize compensatory swallow strategies  Skilled Therapeutic Interventions:   Patient seen for second session in late AM for skilled ST treatment. He was able to write out first and last name and his wife's name, with SLP observing him to look over at cards on wall with wife's name, kid's names. He did exhibit some errors in single letters but able to correct when alerted to errors. Patient unable to write to complete phrase "I drink____" but was able to point to words in field of 6 to communicate likes/dislikes/choices. Patient able to point to object pictures in field of three with 100% accuracy and pointed to object words when function described in field of 6 with 75% accuracy. He approximated labial and lingual movements when SLP modeled with mod-max cues. He continues to benefit from skilled SLP intervention to maximize speech-language and cognitive function prior to discharge.  Pain Pain Assessment Pain Scale: Faces Pain Score: 0-No pain  Therapy/Group: Individual Therapy  Angela Nevin, MA, CCC-SLP Speech Therapy

## 2020-08-15 NOTE — Progress Notes (Addendum)
Nutrition Follow-up  DOCUMENTATION CODES:   Morbid obesity  INTERVENTION:   -continue Prosource Plus 30 mL BID, each supplement provides 100 kcal and 15 grams of protein   -increase Ensure Enlive po to TID, each supplement provides 350 kcal, 20 grams of protein   -continue Juven 1 packet BID, each packet provides 95 calories, 2.5 grams of protein (collagen), and 9.8 grams of carbohydrate (3 grams sugar); also contains 7 grams of L-arginine and L-glutamine, 300 mg vitamin C, 15 mg vitamin E, 1.2 mcg vitamin B-12, 9.5 mg zinc, 200 mg calcium, and 1.5 g  Calcium Beta-hydroxy-Beta-methylbutyrate to support wound healing  -continue MVI with minerals daily   -re-weigh pt  -obtained pt's food preferences and adjusted in [facility]  NUTRITION DIAGNOSIS:   Increased nutrient needs related to acute illness,wound healing as evidenced by estimated needs.  Ongoing.  GOAL:   Patient will meet greater than or equal to 90% of their needs  Ongoing.  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 Past medical history significant for morbid obesity, cellulitis of lower extremity, HTN, GERD, HTN, HLD, OSA, and DM2.  Pt's noted intake has been 50-100% with an average of 70% of meals eaten since 02/08. Intern spoke with pt, pt's wife and pt's mother who discussed with intern that pt has had a decreased appetite since hospital admission. They mentioned that pt has been typically consuming approximately 25-30% of his meals. Pt's wife discussed with intern that pt does not like the chicken, [place] or mashed potatoes that he has been served. Intern noted this in [facility].  Pt's wife mentioned that this morning for breakfast pt consumed 100% of his eggs, banana, milk and juice, but did not consume any bacon.   Pt's mother discussed with intern the possibility of bringing in outside food to pt. Intern encouraged mother to do this to achieve pt's nutritional goals.  Pt and pt's wife indicated that he had been tolerating supplements well and also mentioned that he was enjoying Ensure Enlive. Pt's wife asked if pt could have more Ensure Enlive. Intern will increase Ensure Enlive to TID. Intern discussed with pt the importance of the added calories and protein he is getting from the supplements and how they aid in his healing process.   Pt's weights reviewed and note 11% weight loss in 1 month. This weight loss is significant and severe. Pt has not had a current weight since 02/04, intern recommends for pt to be re-weighed. Pt's wife discussed with intern that pt has had a substantial weight loss since being admitted to the hospital. Wife attributed this weight loss d/t his acute illness as well as not having an appetite and disliking food provided.   Pt denied any abdominal pain, nausea, constipation, diarrhea. Pt's wife did mention that pt did vomit once last night after dinner, however mentioned that this was abnormal and has not happened since admitted.   I&Os Reviewed.   Meds Reviewed: Prosource Plus (30 mL, BID), Vitamin C (500 mg, daily), Vitamin D3 (1000 IU, daily), Ensure Enlive BID, Juven (1 packet, BID), zinc sulfat (220 mg, daily)  Labs Reviewed: Glucose (156 mg/dL),  Lab Results  Component Value Date   HGBA1C 6.2 (H) 07/25/2020  NUTRITION - FOCUSED PHYSICAL EXAM:  Flowsheet Row Most Recent Value  Orbital Region No depletion  Upper Arm Region No depletion  Thoracic and Lumbar Region No depletion  Buccal Region No depletion  Temple Region No depletion  Clavicle Bone Region Mild depletion  Clavicle and Acromion Bone Region Mild depletion  Scapular  Bone Region No depletion  Dorsal Hand No depletion  Patellar Region No depletion  Anterior Thigh Region No depletion  Posterior Calf Region No depletion  Edema (RD Assessment) Mild  [RLE, RUE]  Hair Reviewed  Eyes Reviewed  Mouth Reviewed  Skin Reviewed  Nails Reviewed       Diet Order:   Diet Order            Diet Carb Modified Fluid consistency: Thin; Room service appropriate? No  Diet effective now                 EDUCATION NEEDS:   Not appropriate for education at this time  Skin:  Skin Assessment: Skin Integrity Issues: Skin Integrity Issues:: DTI,Stage II,Incisions DTI: left; right buttocks Stage II: coccyx Incisions: left arm, Other: MASD; groin, lower abdomen  Last BM:  08/15/20  Height:   Ht Readings from Last 1 Encounters:  08/09/20 6\' 1"  (1.854 m)    Weight:   Wt Readings from Last 1 Encounters:  08/09/20 (!) 159.1 kg    Ideal Body Weight:  83.6 kg  BMI:  Body mass index is 46.28 kg/m.  Estimated Nutritional Needs:   Kcal:  2400-2600  Protein:  130-145  Fluid:  >/= 2.4 L    10/07/20, Dietetic Intern 08/16/2020 11:59 AM

## 2020-08-15 NOTE — Progress Notes (Signed)
Speech Language Pathology Daily Session Note  Patient Details  Name: Justin Mason MRN: [id number] Date of Birth: [birth year]  Today's Date: 08/15/2020 SLP Individual Time: 0800-0830 SLP Individual Time Calculation (min): 30 min  Short Term Goals: Week 1: SLP Short Term Goal 1 (Week 1): Pt will follow multistep commands during basic and familiar tasks in 75% of opportunities with min verbal cues SLP Short Term Goal 2 (Week 1): Pt will answer simple-compex yes/no questions with multimodal responses (head nod/shake, thumbs up/down, verbalizations) with 90% accuracy given min A verbal cues SLP Short Term Goal 3 (Week 1): Pt will vocalize on command in 50% of opportunities with Mod A multimodal cues SLP Short Term Goal 4 (Week 1): Pt will consume regular/thin diet with Min A cues to utilize compensatory swallow strategies  Skilled Therapeutic Interventions:   Patient seen for skilled ST session to address speech-language goals. Patient able to perform bilabial movements to approximate lip rounding and lip smacking (using very light articulatory contacts not able to press together tightly) and to blow air through straw and pursed lips. He achieved very short duration of vocalization during yawn/sigh exercise but this was sporadic. Patient continues to benefit from skilled SLP intervention prior to discharge.  Pain Pain Assessment Pain Scale: Faces Pain Score: 0-No pain  Therapy/Group: Individual Therapy  Angela Nevin, MA, CCC-SLP Speech Therapy

## 2020-08-15 NOTE — Plan of Care (Signed)
  Problem: Consults Goal: RH STROKE PATIENT EDUCATION Description: See Patient Education module for education specifics  Outcome: Progressing   Problem: RH BLADDER ELIMINATION Goal: RH STG MANAGE BLADDER WITH ASSISTANCE Description: STG Manage Bladder With min Assistance Outcome: Progressing   Problem: RH PAIN MANAGEMENT Goal: RH STG PAIN MANAGED AT OR BELOW PT'S PAIN GOAL Description: Pain level less than 4 on scale of 0-10 Outcome: Progressing   Problem: RH SKIN INTEGRITY Goal: RH STG MAINTAIN SKIN INTEGRITY WITH ASSISTANCE Description: STG Maintain Skin Integrity With min Assistance. Outcome: Progressing   

## 2020-08-15 NOTE — Progress Notes (Signed)
Occupational Therapy Session Note  Patient Details  Name: VEDH PTACEK MRN: [id number] Date of Birth: 09/16/1980  Today's Date: 08/15/2020 OT Individual Time: 0900-1000 OT Individual Time Calculation (min): 60 min    Short Term Goals: Week 1:  OT Short Term Goal 1 (Week 1): Pt will complete sit<>stand at stedy with min assist in preperation for self care. OT Short Term Goal 2 (Week 1): Pt will complete UB dressing with mod assist using hemitechniques. OT Short Term Goal 3 (Week 1): Pt will complete toilet transfer with mod assist +2 using LRAD. OT Short Term Goal 4 (Week 1): Pt will bathe UB/LB with mod assist using long handled sponge.  Skilled Therapeutic Interventions/Progress Updates:    Patient in bed, alert, non-verbal but follows directions and indicates Y/N via head nods and thumbs up appropriately.  He denies pain and agrees to getting washed and dressed.  Incontinent of large amount of urine.   Completed LB bathing and dressing at bed level.  He is able to reach abdomin, groin, left leg, part of right leg, requires help for right leg, bilateral lower legs and buttocks.  He is able to roll right with CS/rails, left with min/mod A.  Donning incontinence brief and shorts with max A.  Donning socks/shoes dependent.  Supine to sitting edge of bed with mod A.  SB transfer bed to w/c with mod A.  He was able to come to standing position with left rail to adjust pants and position in w/c.  UB bathing/dressing and grooming w/c level - grooming with set up, UB bathing min A for left arm and thoroughness of right arm.  OH shirt mod A, cues for technique.   Completed weight shift and trunk mobility activities w/c level, right UE facilitation - note scapular mobility with gravity eliminated and trace shoulder.  2nd standing attempt with min A prior to set up w/c level at close of session.   Seat belt alarm set and call bell in hand.    Therapy Documentation Precautions:  Precautions Precautions:  Fall Precaution Comments: Significant Right sided hemi; intermittent right sided lean Restrictions Weight Bearing Restrictions: No   Therapy/Group: Individual Therapy  Barrie Lyme 08/15/2020, 7:33 AM

## 2020-08-15 NOTE — Progress Notes (Signed)
Justin Mason PHYSICAL MEDICINE & REHABILITATION PROGRESS NOTE  Subjective/Complaints: Patient seen laying in bed this AM, working with therapies.  He indicates he slept well overnight.  No reported issues. Discussed communication with therapies.   ROS: unable to assess due to expressive aphasia, but appears to deny CP, shortness of breath, nausea, vomiting, diarrhea  Objective: Vital Signs: Blood pressure 134/81, pulse 66, temperature 97.6 F (36.4 C), temperature source Oral, resp. rate 19, height 6\' 1"  (1.854 m), weight (!) 159.1 kg, SpO2 98 %. No results found. Recent Labs    08/13/20 0516  WBC 10.9*  HGB 15.0  HCT 43.6  PLT 214   Recent Labs    08/13/20 0516  NA 133*  K 4.2  CL 96*  CO2 22  GLUCOSE 147*  BUN 51*  CREATININE 1.56*  CALCIUM 10.0    Intake/Output Summary (Last 24 hours) at 08/15/2020 1218 Last data filed at 08/15/2020 0700 Gross per 24 hour  Intake 680 ml  Output -  Net 680 ml     Pressure Injury 08/09/20 Coccyx Mid Stage 2 -  Partial thickness loss of dermis presenting as a shallow open injury with a red, pink wound bed without slough. (Active)  08/09/20 1555  Location: Coccyx  Location Orientation: Mid  Staging: Stage 2 -  Partial thickness loss of dermis presenting as a shallow open injury with a red, pink wound bed without slough.  Wound Description (Comments):   Present on Admission: Yes     Pressure Injury 08/09/20 Buttocks Left;Right Deep Tissue Pressure Injury - Purple or maroon localized area of discolored intact skin or blood-filled blister due to damage of underlying soft tissue from pressure and/or shear. (Active)  08/09/20 1716  Location: Buttocks  Location Orientation: Left;Right  Staging: Deep Tissue Pressure Injury - Purple or maroon localized area of discolored intact skin or blood-filled blister due to damage of underlying soft tissue from pressure and/or shear.  Wound Description (Comments):   Present on Admission: Yes     Physical Exam: BP 134/81 (BP Location: Left Arm)   Pulse 66   Temp 97.6 F (36.4 C) (Oral)   Resp 19   Ht 6\' 1"  (1.854 m)   Wt (!) 159.1 kg   SpO2 98%   BMI 46.28 kg/m  Constitutional: No distress . Vital signs reviewed.  Morbidly obese. HENT: Normocephalic.  Atraumatic. Eyes: EOMI. No discharge. Cardiovascular: No JVD.  RRR. Respiratory: Normal effort.  No stridor.  Bilateral clear to auscultation. GI: Non-distended.  BS +. Skin: Warm and dry.  Intact. Psych: Normal mood.  Normal behavior. Musc: No edema in extremities.  No tenderness in extremities. Neuro: Alert Expressive aphasia, unchanged Nonverbal, unchanged  Motor: RUE 0/5 proximal distal RLE: 2-2+/5 proximal to distal No increase in tone noted  Assessment/Plan: 1. Functional deficits which require 3+ hours per day of interdisciplinary therapy in a comprehensive inpatient rehab setting.  Physiatrist is providing close team supervision and 24 hour management of active medical problems listed below.  Physiatrist and rehab team continue to assess barriers to discharge/monitor patient progress toward functional and medical goals   Care Tool:  Bathing    Body parts bathed by patient: Right arm,Chest,Abdomen,Front perineal area,Left upper leg,Face,Right upper leg   Body parts bathed by helper: Left arm,Buttocks,Right upper leg,Right lower leg,Left lower leg     Bathing assist Assist Level: Moderate Assistance - Patient 50 - 74%     Upper Body Dressing/Undressing Upper body dressing   What is the patient wearing?:  Pull over shirt    Upper body assist Assist Level: Moderate Assistance - Patient 50 - 74%    Lower Body Dressing/Undressing Lower body dressing      What is the patient wearing?: Pants,Incontinence brief     Lower body assist Assist for lower body dressing: Total Assistance - Patient < 25%     Editor, commissioning assist Assist for toileting: 2 Helpers Assistive Device  Comment: Bedrails   Transfers Chair/bed transfer  Transfers assist  Chair/bed transfer activity did not occur: Safety/medical concerns  Chair/bed transfer assist level: 2 Helpers     Locomotion Ambulation   Ambulation assist   Ambulation activity did not occur: Safety/medical concerns          Walk 10 feet activity   Assist  Walk 10 feet activity did not occur: Safety/medical concerns        Walk 50 feet activity   Assist Walk 50 feet with 2 turns activity did not occur: Safety/medical concerns         Walk 150 feet activity   Assist Walk 150 feet activity did not occur: Safety/medical concerns         Walk 10 feet on uneven surface  activity   Assist Walk 10 feet on uneven surfaces activity did not occur: Safety/medical concerns         Wheelchair     Assist Will patient use wheelchair at discharge?: Yes Type of Wheelchair: Manual    Wheelchair assist level: Minimal Assistance - Patient > 75% Max wheelchair distance: 100    Wheelchair 50 feet with 2 turns activity    Assist        Assist Level: Minimal Assistance - Patient > 75%   Wheelchair 150 feet activity     Assist      Assist Level: Moderate Assistance - Patient 50 - 74%    Medical Problem List and Plan: 1.  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  Continue CIR  WHO/PRAFO nightly 2.  Antithrombotics: -DVT/anticoagulation:  Pharmaceutical: Lovenox  Dopplers negative for DVT             -antiplatelet therapy: DAPT X 3 months followed by ASA alone (changed to 81)--Plavix added on 08/09/20 3. Pain Management: Mobic DC'd             Monitor with increased exertion 4. Mood: LCSW to follow for evaluation and support.              -antipsychotic agents: N/A 5. Neuropsych: This patient is not capable of making decisions on his own  behalf. 6. Skin/Wound Care: Routine pressure relief measures.  7. Fluids/Electrolytes/Nutrition: Monitor I/Os.  8. HTN: Monitor BP tid--continue Catapres twice daily, Lopressor bid.   HCTZ daily, DC'd on 2/7  Avapro decreased to 150 on 2/8  Improving control on 2/10             Monitor with increased mobility 9. T2DM with hyperglycemia: Hgb A1c-6.2.   Metformin 1000 mg bid  Marcelline Deist put on hold due to UTI.   Elevated on 2/10, will await resolution of UTI, resumption of Farxiga prior to further changes  Monitor with increased mobility 10. Covid 19--Positive  on 07/24/20-->no treatment as asymptomatic. Continue zinc and vitamin C.  11. Morbid obesity: BMI 46. Educate on appropriate diet and exercise to help promote health and mobility.  Encourage weight loss 12.  Hyperlipidemia: Crestor 13. H/o gout: continue allopurinol.  14. Hyponatremia  Na+ 133 on 2/8, labs ordered for tomorrow  Continue to monitor 15. AKI  Cr.  1.56 on 2/8, labs are for tomorrow  Received IVF overnight on 2/8, increased rate and changed to nightly x 1 more nights  Echo reviewed-EF 60-65%  Encourage fluids 16. Transaminitis  ALT elevated on 2/5,, monitor for tomorrow, continue to monitor 17. Mild leukocytosis  WBCs 10.9 on 2/8, labs ordered for tomorrow  Afebrile  Continue to monitor  See #18 18.  Acute lower UTI  UA +, urine culture showing E. coli  Continue Macrobid started on 2/8  LOS: 6 days A FACE TO FACE EVALUATION WAS PERFORMED  Sharah Finnell Karis Juba 08/15/2020, 12:18 PM

## 2020-08-16 DIAGNOSIS — G8191 Hemiplegia, unspecified affecting right dominant side: Secondary | ICD-10-CM

## 2020-08-16 LAB — CBC WITH DIFFERENTIAL/PLATELET
Abs Immature Granulocytes: 0.07 10*3/uL (ref 0.00–0.07)
Basophils Absolute: 0 10*3/uL (ref 0.0–0.1)
Basophils Relative: 0 %
Eosinophils Absolute: 0.1 10*3/uL (ref 0.0–0.5)
Eosinophils Relative: 1 %
HCT: 38.2 % — ABNORMAL LOW (ref 39.0–52.0)
Hemoglobin: 12.9 g/dL — ABNORMAL LOW (ref 13.0–17.0)
Immature Granulocytes: 1 %
Lymphocytes Relative: 16 %
Lymphs Abs: 1.9 10*3/uL (ref 0.7–4.0)
MCH: 30.1 pg (ref 26.0–34.0)
MCHC: 33.8 g/dL (ref 30.0–36.0)
MCV: 89.3 fL (ref 80.0–100.0)
Monocytes Absolute: 1.3 10*3/uL — ABNORMAL HIGH (ref 0.1–1.0)
Monocytes Relative: 12 %
Neutro Abs: 8 10*3/uL — ABNORMAL HIGH (ref 1.7–7.7)
Neutrophils Relative %: 70 %
Platelets: 158 10*3/uL (ref 150–400)
RBC: 4.28 MIL/uL (ref 4.22–5.81)
RDW: 12.5 % (ref 11.5–15.5)
WBC: 11.4 10*3/uL — ABNORMAL HIGH (ref 4.0–10.5)
nRBC: 0 % (ref 0.0–0.2)

## 2020-08-16 LAB — COMPREHENSIVE METABOLIC PANEL
ALT: 31 U/L (ref 0–44)
AST: 17 U/L (ref 15–41)
Albumin: 3.3 g/dL — ABNORMAL LOW (ref 3.5–5.0)
Alkaline Phosphatase: 42 U/L (ref 38–126)
Anion gap: 12 (ref 5–15)
BUN: 18 mg/dL (ref 6–20)
CO2: 25 mmol/L (ref 22–32)
Calcium: 9.4 mg/dL (ref 8.9–10.3)
Chloride: 98 mmol/L (ref 98–111)
Creatinine, Ser: 1.1 mg/dL (ref 0.61–1.24)
GFR, Estimated: >60 mL/min (ref >60)
Glucose, Bld: 144 mg/dL — ABNORMAL HIGH (ref 70–99)
Potassium: 3.9 mmol/L (ref 3.5–5.1)
Sodium: 135 mmol/L (ref 135–145)
Total Bilirubin: 1 mg/dL (ref 0.3–1.2)
Total Protein: 6.5 g/dL (ref 6.5–8.1)

## 2020-08-16 LAB — GLUCOSE, CAPILLARY
Glucose-Capillary: 155 mg/dL — ABNORMAL HIGH (ref 70–99)
Glucose-Capillary: 234 mg/dL — ABNORMAL HIGH (ref 70–99)
Glucose-Capillary: 157 mg/dL — ABNORMAL HIGH (ref 70–99)
Glucose-Capillary: 197 mg/dL — ABNORMAL HIGH (ref 70–99)

## 2020-08-16 MED ORDER — ENOXAPARIN SODIUM 80 MG/0.8ML ~~LOC~~ SOLN
80.0000 mg | SUBCUTANEOUS | Status: DC
  Administered 2020-08-16 – 2020-09-02 (×18): 80 mg via SUBCUTANEOUS
  Filled 2020-08-16 (×23): qty 0.8

## 2020-08-16 MED ORDER — ENSURE ENLIVE PO LIQD
237.0000 mL | Freq: Three times a day (TID) | ORAL | Status: DC
  Administered 2020-08-16 – 2020-08-20 (×11): 237 mL via ORAL
  Filled 2020-08-16: qty 237

## 2020-08-16 NOTE — Progress Notes (Signed)
Physical Therapy Weekly Progress Note  Patient Details  Name: Justin Mason MRN: [id number] Date of Birth: 1980-10-17  Beginning of progress report period: August 10, 2020 End of progress report period: August 16, 2020  Today's Date: 08/16/2020 PT Individual Time: 3343-5686 PT Individual Time Calculation (min): 53 min   Patient has met 1 of 3 short term goals. Pt demonstrates gradual progress toward long term goals. Pt is currently able to transfer supine<>sitting EOB with as little as mod A but continues to require max A for sit<>supine transfers due to R hemiparesis. Pt able to transfer sit<>stand with max A of 1 at railing in hall and +2 assist using 3 musketeer assist. Pt is able to perform slideboard transfers with mod A of 1 and requires min A for WC mobility up to 111f.   Patient continues to demonstrate the following deficits muscle weakness, impaired timing and sequencing, abnormal tone, unbalanced muscle activation, decreased coordination and decreased motor planning, decreased midline orientation and decreased motor planning, decreased awareness and decreased safety awareness and decreased standing balance, decreased postural control, hemiplegia and decreased balance strategies and therefore will continue to benefit from skilled PT intervention to increase functional independence with mobility.  Patient progressing toward long term goals..  Continue plan of care.  PT Short Term Goals Week 1:  PT Short Term Goal 1 (Week 1): Pt will perform bed mobility with mod assist PT Short Term Goal 1 - Progress (Week 1): Progressing toward goal PT Short Term Goal 2 (Week 1): Pt [facility] 1 without stedy PT Short Term Goal 2 - Progress (Week 1): Met PT Short Term Goal 3 (Week 1): Pt will remain in WC >2 hours between therapies PT Short Term Goal 3 - Progress (Week 1): Met Week 2:  PT Short Term Goal 1 (Week 2): Pt will perform bed mobility with mod  assist PT Short Term Goal 2 (Week 2): Pt will transfer bed<>WC with LRAD and min A of 1 PT Short Term Goal 3 (Week 2): pt will initiate gait training  Skilled Therapeutic Interventions/Progress Updates:  Ambulation/gait training;Balance/vestibular training;Cognitive remediation/compensation;Community reintegration;Discharge planning;Disease management/prevention;DME/adaptive equipment instruction;Functional electrical stimulation;Functional mobility training;Neuromuscular re-education;Pain management;Patient/family education;Psychosocial support;Skin care/wound management;Splinting/orthotics;Therapeutic Exercise;Stair training;Therapeutic Activities;UE/LE Strength taining/ROM;Visual/perceptual remediation/compensation;UE/LE Coordination activities;Wheelchair propulsion/positioning   Today's Interventions: Received pt sitting in WC with HJarrett Sohopresent at bedside, pt continues to communicate with appropriate thumbs up/down gestures and agreeable to therapy and denied any pain during session. Session with emphasis on functional mobility/transfers, generalized strengthening, dynamic standing balance/coordination, NMR, motor control/planning, and improved activity tolerance. [facility] and donned LiteGait harness in sitting with +2 assist and increased time. Pt transferred sit<>stand with heavy max A of 1 and required +2 assist in standing to fasten harness. Pt with increased fatigue from standing ~8 minutes to fasten harness and required +3 assist to lower into WC from standing position as pt essentially sitting in harness due to fatigue. Pt required extensive rest break then [place] to R while maintaining knee extension. RUE ace wrapped to hand grip. Pt required mod/max manual facilitation from therapist to block R knee however pt was able to achieve upright posture/gaze and work on small lateral weight shifts using mirror for visual  feedback. Attempted to work on pre-gait stepping with LLE however pt with increased fatigue requiring additional seated rest break. Pt stood 1 additional time to detach harness, then  declined any further standing due to fatigue. Pt diaphoretic throughout session. Pt transported back to room in [facility] total A and requested to return to bed. Pt transferred WC<>bed via slideboard with mod A and sit<>supine with max A +2 from [place] to manage RUE. Concluded session with pt supine in bed, needs within reach, and bed alarm on.   Therapy Documentation Precautions:  Precautions Precautions: Fall Precaution Comments: Significant Right sided hemi; intermittent right sided lean Restrictions Weight Bearing Restrictions: No  Therapy/Group: Individual Therapy Dashun Borre PT, DPT  08/16/2020, 7:19 AM

## 2020-08-16 NOTE — Progress Notes (Signed)
Occupational Therapy Session Note  Patient Details  Name: Justin Mason MRN: [id number] Date of Birth: [birth year]  Today's Date: 08/16/2020 OT Individual Time: 6226-3335 OT Individual Time Calculation (min): 55 min    Short Term Goals: Week 1:  OT Short Term Goal 1 (Week 1): Pt will complete sit<>stand at stedy with min assist in preperation for self care. OT Short Term Goal 2 (Week 1): Pt will complete UB dressing with mod assist using hemitechniques. OT Short Term Goal 3 (Week 1): Pt will complete toilet transfer with mod assist +2 using LRAD. OT Short Term Goal 4 (Week 1): Pt will bathe UB/LB with mod assist using long handled sponge.  Skilled Therapeutic Interventions/Progress Updates:    Treatment session with focus on self-care retraining, functional mobility, and hemi-techniques.  Pt received upright in bed eating breakfast.  Therapist provided supervision for completion of breakfast.  Pt agreeable to bathing.  Completed LB bathing at bed level with assist to cross RLE into figure 4 position in bed and maintain position to allow pt to wash RLE with good success, pt then able to cross LLE into figure 4 to wash down to L foot with setup.  Therapist again utilized figure 4 position to have pt attempt to thread each leg of pants.  Pt would benefit from continued use of figure 4 at bed level or EOB for LB bathing and dressing.  Pt completed bridging with assist to maintain RLE positioning and then assist to pull pants over hips when rolling.  Pt completed bed mobility max assist to come to sitting EOB.  Engaged in UB bathing and dressing seated EOB with min assist, therapist positioning RUE to allow pt to wash R arm and when threading RUE.  Pt able to don L sock in figure 4 sitting EOB with increased time.  Therapist donned R sock and B shoes while pt maintain sitting EOB.  Completed slide board transfer bed > w/c with mod assist +2 with improved weight shifting when transferring to strong side.   Pt completed oral care in sitting at sink with setup to apply toothpaste.  Pt remained upright in w/c with seat belt alarm on and half lap tray for improved positioning of RUE.    Therapy Documentation Precautions:  Precautions Precautions: Fall Precaution Comments: Significant Right sided hemi; intermittent right sided lean Restrictions Weight Bearing Restrictions: No Pain:  Pt with no c/o pain   Therapy/Group: Individual Therapy  Rosalio Loud 08/16/2020, 8:30 AM

## 2020-08-16 NOTE — Progress Notes (Signed)
Speech Language Pathology Daily Session Note  Patient Details  Name: JEFFORY SNELGROVE MRN: [id number] Date of Birth: Aug 19, 1980  Today's Date: 08/16/2020 SLP Individual Time: 1315-1400 SLP Individual Time Calculation (min): 45 min  Short Term Goals: Week 1: SLP Short Term Goal 1 (Week 1): Pt will follow multistep commands during basic and familiar tasks in 75% of opportunities with min verbal cues SLP Short Term Goal 2 (Week 1): Pt will answer simple-compex yes/no questions with multimodal responses (head nod/shake, thumbs up/down, verbalizations) with 90% accuracy given min A verbal cues SLP Short Term Goal 3 (Week 1): Pt will vocalize on command in 50% of opportunities with Mod A multimodal cues SLP Short Term Goal 4 (Week 1): Pt will consume regular/thin diet with Min A cues to utilize compensatory swallow strategies  Skilled Therapeutic Interventions:   Patient seen to address speech-language and cognitive goals. Patient able to imitate lip rounding, puckering and smacking lips (but continues with light contact). He was able to imitate to place tongue behind front teeth for simulated '/t/' sound with maximal cues but this was inconsistent. He was unable to imitate to produce /k/ lingual positioning and he would shake his head or give thumbs down. Patient pointed to object pictures in field of 2 when named with 100% accuracy and pointed to object words in field of 4 with 90% accuracy, field of 6 with 80% accuracy. Patient continues to benefit from skilled SLP intervention to maximize speech, language, cognitive and swallow function goals.   Pain Pain Assessment Pain Scale: 0-10 Pain Score: 0-No pain  Therapy/Group: Individual Therapy  Angela Nevin, MA, CCC-SLP Speech Therapy

## 2020-08-16 NOTE — Plan of Care (Signed)
  Problem: Consults Goal: RH STROKE PATIENT EDUCATION Description: See Patient Education module for education specifics  Outcome: Progressing   Problem: RH BLADDER ELIMINATION Goal: RH STG MANAGE BLADDER WITH ASSISTANCE Description: STG Manage Bladder With min Assistance Outcome: Progressing   Problem: RH PAIN MANAGEMENT Goal: RH STG PAIN MANAGED AT OR BELOW PT'S PAIN GOAL Description: Pain level less than 4 on scale of 0-10 Outcome: Progressing   Problem: RH SKIN INTEGRITY Goal: RH STG MAINTAIN SKIN INTEGRITY WITH ASSISTANCE Description: STG Maintain Skin Integrity With min Assistance. Outcome: Progressing

## 2020-08-16 NOTE — Progress Notes (Signed)
Speech Language Pathology Daily Session Note  Patient Details  Name: FUMIO VANDAM MRN: [id number] Date of Birth: 22-Mar-1981  Today's Date: 08/16/2020 SLP Individual Time: 1450-1530 SLP Individual Time Calculation (min): 40 min  Short Term Goals: Week 1: SLP Short Term Goal 1 (Week 1): Pt will follow multistep commands during basic and familiar tasks in 75% of opportunities with min verbal cues SLP Short Term Goal 2 (Week 1): Pt will answer simple-compex yes/no questions with multimodal responses (head nod/shake, thumbs up/down, verbalizations) with 90% accuracy given min A verbal cues SLP Short Term Goal 3 (Week 1): Pt will vocalize on command in 50% of opportunities with Mod A multimodal cues SLP Short Term Goal 4 (Week 1): Pt will consume regular/thin diet with Min A cues to utilize compensatory swallow strategies  Skilled Therapeutic Interventions:   Patient seen in afternoon for second ST session today. He demonstrated some improved accuracy with bilabial and lingual movements during imitation and was able to approximate to mouth initial phonemes at CV (consonant vowel) level with mod-max cues. He pointed to adjectives when described, ie: "if you want something to drink" and he pointed to "thirsty". He was 70% accurate with this. He pointed to words representing activities, foods, etc that he liked in field of 8 and appeared to choose only those he truly enjoyed (SLP knew of some of his hobbies, interests). He pointed to object words when told to "point to all the words of fruits", etc. In field of 8 and was 90% accurate. Patient continues to benefit from skilled SLP intervention to maximize cognitive linguistic, speech and swallow function prior to discharge. Pain Pain Assessment Pain Scale: 0-10 Pain Score: 0-No pain  Therapy/Group: Individual Therapy  Angela Nevin, MA, CCC-SLP Speech Therapy

## 2020-08-16 NOTE — Progress Notes (Signed)
[place] PHYSICAL MEDICINE & REHABILITATION PROGRESS NOTE  Subjective/Complaints: Patient seen transferring with therapies this weekend and sitting up.  He appears to deny complaints.  Discussed RLE strength with therapies.   ROS: unable to assess due to expressive aphasia, but appears to deny CP, shortness of breath, nausea, vomiting, diarrhea  Objective: Vital Signs: Blood pressure 120/65, pulse 65, temperature 98.6 F (37 C), temperature source Oral, resp. rate 18, height 6\' 1"  (1.854 m), weight (!) 159.1 kg, SpO2 97 %. No results found. Recent Labs    08/16/20 0443  WBC 11.4*  HGB 12.9*  HCT 38.2*  PLT 158   Recent Labs    08/16/20 0443  NA 135  K 3.9  CL 98  CO2 25  GLUCOSE 144*  BUN 18  CREATININE 1.10  CALCIUM 9.4    Intake/Output Summary (Last 24 hours) at 08/16/2020 0856 Last data filed at 08/16/2020 0700 Gross per 24 hour  Intake 600 ml  Output --  Net 600 ml     Pressure Injury 08/09/20 Coccyx Mid Stage 2 -  Partial thickness loss of dermis presenting as a shallow open injury with a red, pink wound bed without slough. (Active)  08/09/20 1555  Location: Coccyx  Location Orientation: Mid  Staging: Stage 2 -  Partial thickness loss of dermis presenting as a shallow open injury with a red, pink wound bed without slough.  Wound Description (Comments):   Present on Admission: Yes     Pressure Injury 08/09/20 Buttocks Left;Right Deep Tissue Pressure Injury - Purple or maroon localized area of discolored intact skin or blood-filled blister due to damage of underlying soft tissue from pressure and/or shear. (Active)  08/09/20 1716  Location: Buttocks  Location Orientation: Left;Right  Staging: Deep Tissue Pressure Injury - Purple or maroon localized area of discolored intact skin or blood-filled blister due to damage of underlying soft tissue from pressure and/or shear.  Wound Description (Comments):   Present on Admission: Yes    Physical Exam: BP 120/65  (BP Location: Left Arm)   Pulse 65   Temp 98.6 F (37 C) (Oral)   Resp 18   Ht 6\' 1"  (1.854 m)   Wt (!) 159.1 kg   SpO2 97%   BMI 46.28 kg/m  Constitutional: No distress . Vital signs reviewed. Morbidly obese.  HENT: Normocephalic.  Atraumatic. Eyes: EOMI. No discharge. Cardiovascular: No JVD.  RRR. Respiratory: Normal effort.  No stridor.  Bilateral clear to auscultation. GI: Non-distended.  BS +. Skin: Warm and dry.  Intact. Psych: Normal mood.  Normal behavior. Musc: No tenderness in extremities. Right sided edema.  Neuro: Alert Expressive aphasia, stable Nonverbal, unchanged  Motor: RUE 0/5 proximal distal RLE: 2-/5 proximal to distal No increase in tone noted  Assessment/Plan: 1. Functional deficits which require 3+ hours per day of interdisciplinary therapy in a comprehensive inpatient rehab setting.  Physiatrist is providing close team supervision and 24 hour management of active medical problems listed below.  Physiatrist and rehab team continue to assess barriers to discharge/monitor patient progress toward functional and medical goals   Care Tool:  Bathing    Body parts bathed by patient: Right arm,Chest,Abdomen,Left upper leg,Face,Right upper leg,Right lower leg,Left lower leg   Body parts bathed by helper: Left arm     Bathing assist Assist Level: Moderate Assistance - Patient 50 - 74%     Upper Body Dressing/Undressing Upper body dressing   What is the patient wearing?: Pull over shirt    Upper body  assist Assist Level: Minimal Assistance - Patient > 75%    Lower Body Dressing/Undressing Lower body dressing      What is the patient wearing?: Pants     Lower body assist Assist for lower body dressing: Maximal Assistance - Patient 25 - 49%     Toileting Toileting    Toileting assist Assist for toileting: 2 Helpers Assistive Device Comment: Bedrails   Transfers Chair/bed transfer  Transfers assist  Chair/bed transfer activity did not  occur: Safety/medical concerns  Chair/bed transfer assist level: 2 Helpers     Locomotion Ambulation   Ambulation assist   Ambulation activity did not occur: Safety/medical concerns          Walk 10 feet activity   Assist  Walk 10 feet activity did not occur: Safety/medical concerns        Walk 50 feet activity   Assist Walk 50 feet with 2 turns activity did not occur: Safety/medical concerns         Walk 150 feet activity   Assist Walk 150 feet activity did not occur: Safety/medical concerns         Walk 10 feet on uneven surface  activity   Assist Walk 10 feet on uneven surfaces activity did not occur: Safety/medical concerns         Wheelchair     Assist Will patient use wheelchair at discharge?: Yes Type of Wheelchair: Manual    Wheelchair assist level: Minimal Assistance - Patient > 75% Max wheelchair distance: 100    Wheelchair 50 feet with 2 turns activity    Assist        Assist Level: Minimal Assistance - Patient > 75%   Wheelchair 150 feet activity     Assist      Assist Level: Moderate Assistance - Patient 50 - 74%    Medical Problem List and Plan: 1.  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  Continue CIR  WHO/PRAFO nightly 2.  Antithrombotics: -DVT/anticoagulation:  Pharmaceutical: Lovenox  Dopplers negative for DVT             -antiplatelet therapy: DAPT X 3 months followed by ASA alone (changed to 81)--Plavix added on 08/09/20 3. Pain Management: Mobic DC'd             Monitor with increased exertion 4. Mood: LCSW to follow for evaluation and support.              -antipsychotic agents: N/A 5. Neuropsych: This patient is not capable of making decisions on his own behalf. 6. Skin/Wound Care: Routine pressure relief measures.  7. Fluids/Electrolytes/Nutrition: Monitor I/Os.  8.  HTN: Monitor BP tid--continue Catapres twice daily, Lopressor bid.   HCTZ daily, DC'd on 2/7  Avapro decreased to 150 on 2/8  Relatively controlled on 2/11             Monitor with increased mobility 9. T2DM with hyperglycemia: Hgb A1c-6.2.   Metformin 1000 mg bid  Marcelline Deist put on hold due to UTI.   Remains slightly elevated on 2/11, will await resolution of UTI, resumption of Farxiga prior to further changes  Monitor with increased mobility 10. Covid 19--Positive  on 07/24/20-->no treatment as asymptomatic. Continue zinc and vitamin C.  11. Morbid obesity: BMI 46. Educate on appropriate diet and exercise to help promote health and mobility.  Encourage weight loss 12.  Hyperlipidemia: Crestor 13. H/o gout: continue allopurinol.  14. Hyponatremia  Na+ 135 on 2/11  Continue to monitor 15. AKI  Cr.  1.10 on 2/11, labs ordered for Monday  Received IVF x3 nights ending on 2/11  Echo reviewed-EF 60-65%  Encourage fluids 16. Transaminitis: Resolved  ALT within normal limits on 2/11 17. Mild leukocytosis  WBCs 11.4 on 2/11, labs ordered for Monday  Afebrile  Continue to monitor  See #18 18.  Acute lower UTI  UA +, urine culture showing E. coli  Continue Macrobid started on 2/8  LOS: 7 days A FACE TO FACE EVALUATION WAS PERFORMED  Eryn Marandola Karis Juba 08/16/2020, 8:56 AM

## 2020-08-17 DIAGNOSIS — E1169 Type 2 diabetes mellitus with other specified complication: Secondary | ICD-10-CM

## 2020-08-17 DIAGNOSIS — E669 Obesity, unspecified: Secondary | ICD-10-CM

## 2020-08-17 LAB — GLUCOSE, CAPILLARY
Glucose-Capillary: 141 mg/dL — ABNORMAL HIGH (ref 70–99)
Glucose-Capillary: 183 mg/dL — ABNORMAL HIGH (ref 70–99)
Glucose-Capillary: 174 mg/dL — ABNORMAL HIGH (ref 70–99)
Glucose-Capillary: 205 mg/dL — ABNORMAL HIGH (ref 70–99)

## 2020-08-17 NOTE — Progress Notes (Signed)
Physical Therapy Session Note  Patient Details  Name: Justin Mason MRN: [id number] Date of Birth: 1981-04-07  Today's Date: 08/17/2020 PT Individual Time: 0800-0853 PT Individual Time Calculation (min): 53 min   Short Term Goals: Week 1:  PT Short Term Goal 1 (Week 1): Pt will perform bed mobility with mod assist PT Short Term Goal 1 - Progress (Week 1): Progressing toward goal PT Short Term Goal 2 (Week 1): Pt will trasnfer to and from [facility] with max assist of 1 without stedy PT Short Term Goal 2 - Progress (Week 1): Met PT Short Term Goal 3 (Week 1): Pt will remain in WC >2 hours between therapies PT Short Term Goal 3 - Progress (Week 1): Met Week 2:  PT Short Term Goal 1 (Week 2): Pt will perform bed mobility with mod assist PT Short Term Goal 2 (Week 2): Pt will transfer bed<>WC with LRAD and min A of 1 PT Short Term Goal 3 (Week 2): pt will initiate gait training  Skilled Therapeutic Interventions/Progress Updates:   Received pt supine in bed with NT assisting pt with finishing breakfast, PT took over with care and provided full supervision while pt finished. Pt able to perform oral care with set up assist afterwards. Pt continues to communicate through appropriate thumbs up/down gestures. Pt agreeable to therapy and c/o pain in R shoulder however no subluxation found. RN present to administer medications and discussed skin tear on pt's R arm and R shoulder pain. RN requesting topical pain relief from MD. Session with focus on functional mobility/transfers, dressing, generalized strengthening, and improved activity tolerance. Pt with edema in RLE, donned ted hose with total A and shorts with total A to thread RLE through. Pt able to manage clothing on L side without assist. Pt rolled with max A and required +2 assist to pull pants over hips in supine. Donned shoes in supine with total A and pt transferred supine<>sitting EOB with mod A for R trunk control. Pt able to slide BLEs off bed without  assist today. Pt transferred bed<>WC via slideboard with mod A of 1 and +2 to stabilize WC with verbal and tactile cues for foot and hand placement when scooting. Pt sat in [place] and washed face at sink with set up assist using LUE. Discussed pressure relief strategies including lateral and anterior leans. Concluded session with pt sitting in WC, needs within reach, and seatbelt alarm on. RUE supported on pillow on lap tray for improved hemibody positioning.   Therapy Documentation Precautions:  Precautions Precautions: Fall Precaution Comments: Significant Right sided hemi; intermittent right sided lean Restrictions Weight Bearing Restrictions: No  Therapy/Group: Individual Therapy Tarvis Blossom PT, DPT   08/17/2020, 7:25 AM

## 2020-08-17 NOTE — Progress Notes (Signed)
Speech Language Pathology Weekly Progress and Session Note  Patient Details  Name: Justin Mason MRN: [id number] Date of Birth: 02/14/1981  Beginning of progress report period: 08/10/2020 End of progress report period: 08/17/2020  Today's Date: 08/17/2020 SLP Individual Time: 1445-1530 SLP Individual Time Calculation (min): 45 min  Short Term Goals: Week 1: SLP Short Term Goal 1 (Week 1): Pt will follow multistep commands during basic and familiar tasks in 75% of opportunities with min verbal cues SLP Short Term Goal 1 - Progress (Week 1): Met SLP Short Term Goal 2 (Week 1): Pt will answer simple-compex yes/no questions with multimodal responses (head nod/shake, thumbs up/down, verbalizations) with 90% accuracy given min A verbal cues SLP Short Term Goal 2 - Progress (Week 1): Met SLP Short Term Goal 3 (Week 1): Pt will vocalize on command in 50% of opportunities with Mod A multimodal cues SLP Short Term Goal 3 - Progress (Week 1): Not progressing SLP Short Term Goal 4 (Week 1): Pt will consume regular/thin diet with Min A cues to utilize compensatory swallow strategies SLP Short Term Goal 4 - Progress (Week 1): Met    New Short Term Goals: Week 2: SLP Short Term Goal 1 (Week 2): Patient will imitate to mouth bilabial and lingual speech motor movements at CV (consonant-vowel) and CVCV word level with modA. SLP Short Term Goal 2 (Week 2): Patient will utilize word/picture communication boards to request and respond to wants/needs questions with setup and supervision A. SLP Short Term Goal 3 (Week 2): Patient will write 3-letter words to name objects/pictures with modA cues. SLP Short Term Goal 4 (Week 2): Patient will imitate to produce voiceless phonemes /h/ in initial position of CV (consonant vowel) words with maxA.  Weekly Progress Updates:  Patient made good progress and met 3/4 STG's. Voicing goal was not met and patient has not been able to progress with it secondary to severe  oral-motor, speech apraxia. He is able to imitate to produce some bilabial and lingual oral motor movements and produce voiceless consonant /p/. He is demonstrating very good consistency and accuracy with low tech (picture boards, word choices) AAC and plan to trial more high tech devices if able.   Intensity: Minumum of 1-2 x/day, 30 to 90 minutes Frequency: 3 to 5 out of 7 days Duration/Length of Stay: 2.5-3 weeks Treatment/Interventions: Cognitive remediation/compensation;Multimodal communication approach;Functional tasks;Therapeutic Activities;Therapeutic Exercise;Oral motor exercises;Dysphagia/aspiration precaution training;Patient/family education   Daily Session  Skilled Therapeutic Interventions: Patient seen with spouse present for skilled ST session. Patient able to imitate to round lips, lightly press lips (/p/) with min-modA and produce bilabial phoneme motor movements in succession (buh-buh, etc) with mod-max A. He was able to produce voiceless /p/  with cue of taking big breath and performing motor movements for /p/ while releasing air. He was not able to produce /h/ despite maximal cues and varied attempts to cue. Patient copied 3-letter word after SLP wrote it down on dry erase board then immediately erased it. He was able to write his first name with 100% accuracy and wrote his wife's name with 75% accuracy. He pointed to different color words in field of 8 to identify color of object SLP presented, and was 90% accurate. SLP discussed with wife plan for trialing some AAC devices (low tech vs high tech) as patient appears to be a very good candidate for this. She is understanding that although we will continue to work on verbalizations, his strengths are in non-verbal communication.       General    Pain Pain Assessment Pain Scale: Faces Faces Pain Scale: No hurt  Therapy/Group: Individual Therapy  [name], MA, CCC-SLP Speech Therapy       

## 2020-08-17 NOTE — Plan of Care (Signed)
  Problem: Consults Goal: RH STROKE PATIENT EDUCATION Description: See Patient Education module for education specifics  Outcome: Progressing   Problem: RH BLADDER ELIMINATION Goal: RH STG MANAGE BLADDER WITH ASSISTANCE Description: STG Manage Bladder With min Assistance Outcome: Progressing   Problem: RH PAIN MANAGEMENT Goal: RH STG PAIN MANAGED AT OR BELOW PT'S PAIN GOAL Description: Pain level less than 4 on scale of 0-10 Outcome: Progressing   Problem: RH SKIN INTEGRITY Goal: RH STG MAINTAIN SKIN INTEGRITY WITH ASSISTANCE Description: STG Maintain Skin Integrity With min Assistance. Outcome: Progressing   

## 2020-08-17 NOTE — Progress Notes (Signed)
[place] PHYSICAL MEDICINE & REHABILITATION PROGRESS NOTE  Subjective/Complaints: Pt in bed. Appears comfortable. No problems per nursing last night  ROS: limited due to language/communication   Objective: Vital Signs: Blood pressure 104/70, pulse 63, temperature 97.8 F (36.6 C), resp. rate 18, height 6\' 1"  (1.854 m), weight (!) 157.3 kg, SpO2 99 %. No results found. Recent Labs    08/16/20 0443  WBC 11.4*  HGB 12.9*  HCT 38.2*  PLT 158   Recent Labs    08/16/20 0443  NA 135  K 3.9  CL 98  CO2 25  GLUCOSE 144*  BUN 18  CREATININE 1.10  CALCIUM 9.4    Intake/Output Summary (Last 24 hours) at 08/17/2020 1041 Last data filed at 08/17/2020 0805 Gross per 24 hour  Intake 680 ml  Output --  Net 680 ml     Pressure Injury 08/09/20 Coccyx Mid Stage 2 -  Partial thickness loss of dermis presenting as a shallow open injury with a red, pink wound bed without slough. (Active)  08/09/20 1555  Location: Coccyx  Location Orientation: Mid  Staging: Stage 2 -  Partial thickness loss of dermis presenting as a shallow open injury with a red, pink wound bed without slough.  Wound Description (Comments):   Present on Admission: Yes     Pressure Injury 08/09/20 Buttocks Left;Right Deep Tissue Pressure Injury - Purple or maroon localized area of discolored intact skin or blood-filled blister due to damage of underlying soft tissue from pressure and/or shear. (Active)  08/09/20 1716  Location: Buttocks  Location Orientation: Left;Right  Staging: Deep Tissue Pressure Injury - Purple or maroon localized area of discolored intact skin or blood-filled blister due to damage of underlying soft tissue from pressure and/or shear.  Wound Description (Comments):   Present on Admission: Yes    Physical Exam: BP 104/70 (BP Location: Left Arm)   Pulse 63   Temp 97.8 F (36.6 C)   Resp 18   Ht 6\' 1"  (1.854 m)   Wt (!) 157.3 kg   SpO2 99%   BMI 45.75 kg/m  Constitutional: No distress  . Vital signs reviewed. obese HEENT: EOMI, oral membranes moist Neck: supple Cardiovascular: RRR without murmur. No JVD    Respiratory/Chest: CTA Bilaterally without wheezes or rales. Normal effort    GI/Abdomen: BS +, non-tender, non-distended Ext: no clubbing, cyanosis, or edema Psych: appears to be in reasonable spirits.  Musc: No tenderness in extremities. Right sided edema.  Neuro: Alert Expressive aphasia-non verbal Nonverbal, unchanged  Motor: RUE 0/5 proximal distal RLE: 2-/5 proximal to 0/5 distal--withdrew to pain stim No increase in tone noted  Assessment/Plan: 1. Functional deficits which require 3+ hours per day of interdisciplinary therapy in a comprehensive inpatient rehab setting.  Physiatrist is providing close team supervision and 24 hour management of active medical problems listed below.  Physiatrist and rehab team continue to assess barriers to discharge/monitor patient progress toward functional and medical goals   Care Tool:  Bathing    Body parts bathed by patient: Right arm,Chest,Abdomen,Left upper leg,Face,Right upper leg,Right lower leg,Left lower leg   Body parts bathed by helper: Left arm     Bathing assist Assist Level: Moderate Assistance - Patient 50 - 74%     Upper Body Dressing/Undressing Upper body dressing   What is the patient wearing?: Pull over shirt    Upper body assist Assist Level: Minimal Assistance - Patient > 75%    Lower Body Dressing/Undressing Lower body dressing  What is the patient wearing?: Pants     Lower body assist Assist for lower body dressing: Maximal Assistance - Patient 25 - 49%     Toileting Toileting    Toileting assist Assist for toileting: 2 Helpers Assistive Device Comment: Bedrails   Transfers Chair/bed transfer  Transfers assist  Chair/bed transfer activity did not occur: Safety/medical concerns  Chair/bed transfer assist level: Moderate Assistance - Patient 50 - 74% (slideboard)      Locomotion Ambulation   Ambulation assist   Ambulation activity did not occur: Safety/medical concerns          Walk 10 feet activity   Assist  Walk 10 feet activity did not occur: Safety/medical concerns        Walk 50 feet activity   Assist Walk 50 feet with 2 turns activity did not occur: Safety/medical concerns         Walk 150 feet activity   Assist Walk 150 feet activity did not occur: Safety/medical concerns         Walk 10 feet on uneven surface  activity   Assist Walk 10 feet on uneven surfaces activity did not occur: Safety/medical concerns         Wheelchair     Assist Will patient use wheelchair at discharge?: Yes Type of Wheelchair: Manual    Wheelchair assist level: Minimal Assistance - Patient > 75% Max wheelchair distance: 100    Wheelchair 50 feet with 2 turns activity    Assist        Assist Level: Minimal Assistance - Patient > 75%   Wheelchair 150 feet activity     Assist      Assist Level: Moderate Assistance - Patient 50 - 74%   BP 104/70 (BP Location: Left Arm)   Pulse 63   Temp 97.8 F (36.6 C)   Resp 18   Ht 6\' 1"  (1.854 m)   Wt (!) 157.3 kg   SpO2 99%   BMI 45.75 kg/m   Medical Problem List and Plan: 1.  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  Continue CIR  WHO/PRAFO nightly 2.  Antithrombotics: -DVT/anticoagulation:  Pharmaceutical: Lovenox  Dopplers negative for DVT             -antiplatelet therapy: DAPT X 3 months followed by ASA alone (changed to 81)--Plavix added on 08/09/20 3. Pain Management: Mobic DC'd             Monitor with increased exertion 4. Mood: LCSW to follow for evaluation and support.              -antipsychotic agents: N/A 5. Neuropsych: This patient is not capable of making decisions on his own behalf. 6. Skin/Wound Care: Routine  pressure relief measures.  7. Fluids/Electrolytes/Nutrition: Monitor I/Os.  8. HTN: Monitor BP tid--continue Catapres twice daily, Lopressor bid.   HCTZ daily, DC'd on 2/7  Avapro decreased to 150 on 2/8  Well controlled on 2/12             Monitor with increased mobility 9. T2DM with hyperglycemia: Hgb A1c-6.2.   CBG (last 3)  Recent Labs    08/16/20 1623 08/16/20 2139 08/17/20 0539  GLUCAP 157* 197* 141*      Metformin 1000 mg bid  10/15/20 put on hold due to UTI.   Remains slightly elevated on 2/12, [place] resolution of UTI, resumption of Farxiga prior to further  changes  10. Covid 19--Positive  on 07/24/20-->no treatment as asymptomatic. Continue zinc and vitamin C.  11. Morbid obesity: BMI 46. Educate on appropriate diet and exercise to help promote health and mobility.              Encourage weight loss 12.  Hyperlipidemia: Crestor 13. H/o gout: continue allopurinol.  14. Hyponatremia  Na+ 135 on 2/11  Continue to monitor 15. AKI  Cr.  1.10 on 2/11, labs ordered for Monday  Received IVF x3 nights ending on 2/11  Echo reviewed-EF 60-65%  Encourage fluids 16. Elevated liver enzymes: Resolved  ALT within normal limits on 2/11 17. Mild leukocytosis  WBCs 11.4 on 2/11, labs ordered for Monday  Afebrile  Continue to monitor  See #18 18.  Acute lower UTI  UA +, urine culture showing E. coli  Continue Macrobid started on 2/8  LOS: 8 days A FACE TO FACE EVALUATION WAS PERFORMED  Ranelle Oyster 08/17/2020, 10:41 AM

## 2020-08-18 DIAGNOSIS — R509 Fever, unspecified: Secondary | ICD-10-CM

## 2020-08-18 LAB — CBC
HCT: 35.2 % — ABNORMAL LOW (ref 39.0–52.0)
Hemoglobin: 12.8 g/dL — ABNORMAL LOW (ref 13.0–17.0)
MCH: 31.3 pg (ref 26.0–34.0)
MCHC: 36.4 g/dL — ABNORMAL HIGH (ref 30.0–36.0)
MCV: 86.1 fL (ref 80.0–100.0)
Platelets: 170 10*3/uL (ref 150–400)
RBC: 4.09 MIL/uL — ABNORMAL LOW (ref 4.22–5.81)
RDW: 12.4 % (ref 11.5–15.5)
WBC: 8.5 10*3/uL (ref 4.0–10.5)
nRBC: 0 % (ref 0.0–0.2)

## 2020-08-18 LAB — GLUCOSE, CAPILLARY
Glucose-Capillary: 163 mg/dL — ABNORMAL HIGH (ref 70–99)
Glucose-Capillary: 197 mg/dL — ABNORMAL HIGH (ref 70–99)
Glucose-Capillary: 99 mg/dL (ref 70–99)
Glucose-Capillary: 137 mg/dL — ABNORMAL HIGH (ref 70–99)

## 2020-08-18 MED ORDER — GLIMEPIRIDE 2 MG PO TABS
1.0000 mg | ORAL_TABLET | Freq: Every day | ORAL | Status: DC
  Administered 2020-08-18 – 2020-08-20 (×3): 1 mg via ORAL
  Filled 2020-08-18 (×3): qty 1

## 2020-08-18 MED ORDER — DAPAGLIFLOZIN PROPANEDIOL 5 MG PO TABS: 5.0000 mg | ORAL_TABLET | Freq: Every day | ORAL | Status: DC

## 2020-08-18 NOTE — Progress Notes (Addendum)
[facility] PHYSICAL MEDICINE & REHABILITATION PROGRESS NOTE  Subjective/Complaints: Pt appears comfortable. Low grade temp last night.  ROS: limited due to language/communication     Objective: Vital Signs: Blood pressure 126/74, pulse 63, temperature 97.9 F (36.6 C), temperature source Oral, resp. rate 18, height 6\' 1"  (1.854 m), weight (!) 157.3 kg, SpO2 98 %. No results found. Recent Labs    08/16/20 0443  WBC 11.4*  HGB 12.9*  HCT 38.2*  PLT 158   Recent Labs    08/16/20 0443  NA 135  K 3.9  CL 98  CO2 25  GLUCOSE 144*  BUN 18  CREATININE 1.10  CALCIUM 9.4    Intake/Output Summary (Last 24 hours) at 08/18/2020 0911 Last data filed at 08/18/2020 08/20/2020 Gross per 24 hour  Intake 830 ml  Output --  Net 830 ml     Pressure Injury 08/09/20 Coccyx Mid Stage 2 -  Partial thickness loss of dermis presenting as a shallow open injury with a red, pink wound bed without slough. (Active)  08/09/20 1555  Location: Coccyx  Location Orientation: Mid  Staging: Stage 2 -  Partial thickness loss of dermis presenting as a shallow open injury with a red, pink wound bed without slough.  Wound Description (Comments):   Present on Admission: Yes     Pressure Injury 08/09/20 Buttocks Left;Right Deep Tissue Pressure Injury - Purple or maroon localized area of discolored intact skin or blood-filled blister due to damage of underlying soft tissue from pressure and/or shear. (Active)  08/09/20 1716  Location: Buttocks  Location Orientation: Left;Right  Staging: Deep Tissue Pressure Injury - Purple or maroon localized area of discolored intact skin or blood-filled blister due to damage of underlying soft tissue from pressure and/or shear.  Wound Description (Comments):   Present on Admission: Yes    Physical Exam: BP 126/74 (BP Location: Left Arm)   Pulse 63   Temp 97.9 F (36.6 C) (Oral)   Resp 18   Ht 6\' 1"  (1.854 m)   Wt (!) 157.3 kg   SpO2 98%   BMI 45.75 kg/m   Constitutional: No distress . Vital signs reviewed. HEENT: EOMI, oral membranes moist Neck: supple Cardiovascular: RRR without murmur. No JVD    Respiratory/Chest: CTA Bilaterally without wheezes or rales. Normal effort    GI/Abdomen: BS +, non-tender, non-distended Ext: no clubbing, cyanosis, or edema Psych: cooperates as possible  Musc: No tenderness in extremities. Right sided edema.  Neuro: Alert Expressive aphasia-non verbal Nonverbal, unchanged  Motor: RUE 0/5 proximal distal RLE: 2-/5 proximal to 0/5 distal--withdrew to pain stim No increase in tone noted  Assessment/Plan: 1. Functional deficits which require 3+ hours per day of interdisciplinary therapy in a comprehensive inpatient rehab setting.  Physiatrist is providing close team supervision and 24 hour management of active medical problems listed below.  Physiatrist and rehab team continue to assess barriers to discharge/monitor patient progress toward functional and medical goals   Care Tool:  Bathing    Body parts bathed by patient: Right arm,Chest,Abdomen,Left upper leg,Face,Right upper leg,Right lower leg,Left lower leg   Body parts bathed by helper: Left arm     Bathing assist Assist Level: Moderate Assistance - Patient 50 - 74%     Upper Body Dressing/Undressing Upper body dressing   What is the patient wearing?: Pull over shirt    Upper body assist Assist Level: Minimal Assistance - Patient > 75%    Lower Body Dressing/Undressing Lower body dressing  What is the patient wearing?: Pants     Lower body assist Assist for lower body dressing: Maximal Assistance - Patient 25 - 49%     Toileting Toileting    Toileting assist Assist for toileting: 2 Helpers Assistive Device Comment: Bedrails   Transfers Chair/bed transfer  Transfers assist  Chair/bed transfer activity did not occur: Safety/medical concerns  Chair/bed transfer assist level: Moderate Assistance - Patient 50 - 74%  (slideboard)     Locomotion Ambulation   Ambulation assist   Ambulation activity did not occur: Safety/medical concerns          Walk 10 feet activity   Assist  Walk 10 feet activity did not occur: Safety/medical concerns        Walk 50 feet activity   Assist Walk 50 feet with 2 turns activity did not occur: Safety/medical concerns         Walk 150 feet activity   Assist Walk 150 feet activity did not occur: Safety/medical concerns         Walk 10 feet on uneven surface  activity   Assist Walk 10 feet on uneven surfaces activity did not occur: Safety/medical concerns         Wheelchair     Assist Will patient use wheelchair at discharge?: Yes Type of Wheelchair: Manual    Wheelchair assist level: Minimal Assistance - Patient > 75% Max wheelchair distance: 100    Wheelchair 50 feet with 2 turns activity    Assist        Assist Level: Minimal Assistance - Patient > 75%   Wheelchair 150 feet activity     Assist      Assist Level: Moderate Assistance - Patient 50 - 74%   BP 126/74 (BP Location: Left Arm)   Pulse 63   Temp 97.9 F (36.6 C) (Oral)   Resp 18   Ht 6\' 1"  (1.854 m)   Wt (!) 157.3 kg   SpO2 98%   BMI 45.75 kg/m   Medical Problem List and Plan: 1.  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  Continue CIR  WHO/PRAFO nightly 2.  Antithrombotics: -DVT/anticoagulation:  Pharmaceutical: Lovenox  Dopplers negative for DVT             -antiplatelet therapy: DAPT X 3 months followed by ASA alone (changed to 81)--Plavix added on 08/09/20 3. Pain Management: Mobic DC'd             Monitor with increased exertion 4. Mood: LCSW to follow for evaluation and support.              -antipsychotic agents: N/A 5. Neuropsych: This patient is not capable of making decisions on his own behalf. 6.  Skin/Wound Care: Routine pressure relief measures.  7. Fluids/Electrolytes/Nutrition: Monitor I/Os.  8. HTN: Monitor BP tid--continue Catapres twice daily, Lopressor bid.   HCTZ daily, DC'd on 2/7  Avapro decreased to 150 on 2/8  Well controlled on 2/13             Monitor with increased mobility 9. T2DM with hyperglycemia: Hgb A1c-6.2.   CBG (last 3)  Recent Labs    08/17/20 1624 08/17/20 2140 08/18/20 0534  GLUCAP 174* 205* 163*      Metformin 1000 mg bid  Farxiga put on hold due to UTI (contraindicated).   Remains  elevated on 2/13- add low dose amaryl 1mg  daily 10. Covid  19--Positive  on 07/24/20-->no treatment as asymptomatic. Continue zinc and vitamin C.  11. Morbid obesity: BMI 46. Educate on appropriate diet and exercise to help promote health and mobility.              Encourage weight loss 12.  Hyperlipidemia: Crestor 13. H/o gout: continue allopurinol.  14. Hyponatremia  Na+ 135 on 2/11  Continue to monitor 15. AKI  Cr.  1.10 on 2/11, labs ordered for Monday  Received IVF x3 nights ending on 2/11  Echo reviewed-EF 60-65%  Encourage fluids 16. Elevated liver enzymes: Resolved  ALT within normal limits on 2/11 17. Mild leukocytosis, low grade temp (100.2 2/12)  WBCs 11.4 on 2/11--sl trend up---recheck cbc today  Encourage IS as possible, OOB  Consider cxr if wbc's further elevated or if temp increases  See #18 18.  Acute lower UTI  UA +, urine culture showing E. coli  Continue Macrobid started on 2/8   LOS: 9 days A FACE TO FACE EVALUATION WAS PERFORMED  Ranelle Oyster 08/18/2020, 9:11 AM

## 2020-08-18 NOTE — Plan of Care (Signed)
  Problem: Consults Goal: RH STROKE PATIENT EDUCATION Description: See Patient Education module for education specifics  Outcome: Progressing   Problem: RH BLADDER ELIMINATION Goal: RH STG MANAGE BLADDER WITH ASSISTANCE Description: STG Manage Bladder With min Assistance Outcome: Progressing   Problem: RH PAIN MANAGEMENT Goal: RH STG PAIN MANAGED AT OR BELOW PT'S PAIN GOAL Description: Pain level less than 4 on scale of 0-10 Outcome: Progressing   Problem: RH SKIN INTEGRITY Goal: RH STG MAINTAIN SKIN INTEGRITY WITH ASSISTANCE Description: STG Maintain Skin Integrity With min Assistance. Outcome: Progressing   

## 2020-08-18 NOTE — Progress Notes (Signed)
Patient is cooperative, and denies pain VSS, repositioned for comfort, has expressive aphagia but able to make his basic needs know to staff. Hand splint and right foot boot applied. Refer to assessment data sheet for additional information, consuming po liquids as tolerated.  0340 Repositioned and turned with incontinent care provided denies pain

## 2020-08-18 NOTE — Progress Notes (Signed)
Occupational Therapy Session Note  Patient Details  Name: Justin Mason MRN: [id number] Date of Birth: 10-10-1980  Today's Date: 08/18/2020 OT Individual Time: 1120-1200 OT Individual Time Calculation (min): 40 min    Short Term Goals: Week 1:  OT Short Term Goal 1 (Week 1): Pt will complete sit<>stand at stedy with min assist in preperation for self care. OT Short Term Goal 2 (Week 1): Pt will complete UB dressing with mod assist using hemitechniques. OT Short Term Goal 3 (Week 1): Pt will complete toilet transfer with mod assist +2 using LRAD. OT Short Term Goal 4 (Week 1): Pt will bathe UB/LB with mod assist using long handled sponge.  Skilled Therapeutic Interventions/Progress Updates:   Pt received supine with no c/o pain. Pt non-verbal throughout session but able to use thumbs up/down and communication board as needed. He completed bed mobility to EOB with mod A. Pt sat EOB for ADLs and had no LOB, sitting with supervision. Pt required cueing/demo for hemi technique throughout bathing/dressing. Mod A for UB dressing. Min A for UB bathing with demo provided re hemi bathing techniques to wash under both R and L UE. Pt declined LB bathing or dressing. EOB he completed facilitated weightbearing activity on the RUE, with OT providing max facilitation to maintain elbow extension while he reached across midline with his LUE. He tolerated PROM to the RUE with little to no pain, performed to maintain muscle length and reduce contracture risk. Pt reported intermittent pain at the R shoulder during session. Pt was assisted back to supine with mod A. With bed in trend position he was able to pull himself up higher in bed. Pt was left supine with all needs met. Bed alarm set.   Therapy Documentation Precautions:  Precautions Precautions: Fall Precaution Comments: Significant Right sided hemi; intermittent right sided lean Restrictions Weight Bearing Restrictions: (P) No   Therapy/Group:  Individual Therapy  Curtis Sites 08/18/2020, 6:40 AM

## 2020-08-18 NOTE — Progress Notes (Signed)
Tylenol po administered for Temperature, and po liquids provided with monitor   Reck of Temp and WNL

## 2020-08-19 LAB — CBC
HCT: 33.5 % — ABNORMAL LOW (ref 39.0–52.0)
Hemoglobin: 11.6 g/dL — ABNORMAL LOW (ref 13.0–17.0)
MCH: 30.7 pg (ref 26.0–34.0)
MCHC: 34.6 g/dL (ref 30.0–36.0)
MCV: 88.6 fL (ref 80.0–100.0)
Platelets: 167 10*3/uL (ref 150–400)
RBC: 3.78 MIL/uL — ABNORMAL LOW (ref 4.22–5.81)
RDW: 12.2 % (ref 11.5–15.5)
WBC: 8.2 10*3/uL (ref 4.0–10.5)
nRBC: 0 % (ref 0.0–0.2)

## 2020-08-19 LAB — BASIC METABOLIC PANEL
Anion gap: 11 (ref 5–15)
BUN: 17 mg/dL (ref 6–20)
CO2: 25 mmol/L (ref 22–32)
Calcium: 9.2 mg/dL (ref 8.9–10.3)
Chloride: 97 mmol/L — ABNORMAL LOW (ref 98–111)
Creatinine, Ser: 0.83 mg/dL (ref 0.61–1.24)
GFR, Estimated: >60 mL/min (ref >60)
Glucose, Bld: 170 mg/dL — ABNORMAL HIGH (ref 70–99)
Potassium: 3.7 mmol/L (ref 3.5–5.1)
Sodium: 133 mmol/L — ABNORMAL LOW (ref 135–145)

## 2020-08-19 LAB — GLUCOSE, CAPILLARY
Glucose-Capillary: 177 mg/dL — ABNORMAL HIGH (ref 70–99)
Glucose-Capillary: 189 mg/dL — ABNORMAL HIGH (ref 70–99)
Glucose-Capillary: 171 mg/dL — ABNORMAL HIGH (ref 70–99)
Glucose-Capillary: 95 mg/dL (ref 70–99)
Glucose-Capillary: 166 mg/dL — ABNORMAL HIGH (ref 70–99)

## 2020-08-19 NOTE — Progress Notes (Signed)
Speech Language Pathology Daily Session Note  Patient Details  Name: DONTRELL STUCK MRN: [id number] Date of Birth: [birth year]  Today's Date: 08/19/2020 SLP Individual Time: 0932-1030 SLP Individual Time Calculation (min): 58 min  Short Term Goals: Week 2: SLP Short Term Goal 1 (Week 2): Patient will imitate to mouth bilabial and lingual speech motor movements at CV (consonant-vowel) and CVCV word level with modA. SLP Short Term Goal 2 (Week 2): Patient will utilize word/picture communication boards to request and respond to wants/needs questions with setup and supervision A. SLP Short Term Goal 3 (Week 2): Patient will write 3-letter words to name objects/pictures with modA cues. SLP Short Term Goal 4 (Week 2): Patient will imitate to produce voiceless phonemes /h/ in initial position of CV (consonant vowel) words with maxA.  Skilled Therapeutic Interventions: Pt seen for skilled ST with focus on communication, girlfriend present towards end of session. With mirror for visual feedback and min A, pt able to achieve adequate labial protrusion, approximate labial protraction, lightly press lips together and place tongue behind upper teeth (/l/). Pt producing voiceless /p/ with cues for deep breath x20. Pt able to mouthe 1-syllable words beginning with /b/ and /p/ with 75% accuracy, patient mouthing final sounds that include /d/, /s/, and /m/. Pt unable to accurately mouthe words that begin with any other sound besides /b/ and /p/, demonstrating oral apraxia and perseverative labial motions during attempts. Wife and patient updated on exercises to complete in room independently, all questions answered. Pt left in bed with alarm set and all needs met. Cont ST POC.   Pain Pain Assessment Pain Scale: 0-10 Pain Score: 0-No pain  Therapy/Group: Individual Therapy  Dewaine Conger 08/19/2020, 10:22 AM

## 2020-08-19 NOTE — Progress Notes (Signed)
Physical Therapy Session Note  Patient Details  Name: Justin Mason MRN: [id number] Date of Birth: [birth year]  Today's Date: 08/19/2020 PT Individual Time: 1300-1430 PT Individual Time Calculation (min): 90 min   Short Term Goals: Week 2:  PT Short Term Goal 1 (Week 2): Pt will perform bed mobility with mod assist PT Short Term Goal 2 (Week 2): Pt will transfer bed<>WC with LRAD and min A of 1 PT Short Term Goal 3 (Week 2): pt will initiate gait training  Skilled Therapeutic Interventions/Progress Updates:    Patient in supine with girlfriend present.  Patient supine to R sidelying with cues and min A.  Side to sit with mod A.  Patient performed slide board transfer max A and increased time with poor motor planning, difficulty with anterior and R lateral weight shifting.  Attempted with +2 A to stand at rail in hallway to reposition hips in w/c and unable.  In w/c assisted to therapy gym.  Patient indicated had performed SBT to R side before so performed SBT to R to mat with +2 mod A.  Patient seated EOM to work on anterior weight shift with both hands on big blue therapy ball A for R and rolling out and back, then keeping out with lateral weight shifts working on lifting opposite hip.  Sit to sidelying on L with mod A and pt rolled to supine.  A for lateral trunk rotation in supine.  Then performed scooting to R in supine with mod A and cues.  Supine to L sidelying with mod A.  Performed pelvic and scapular diagonals with assist, facilitation and then trunk rotation with mod A.  In L sidelying R LE AAROM on powder board with quick stretch for hip flexor and max cues.  Patient side to sit with mod A.  Slide board transfer to w/c to L with mod A and increased time.  Patient assisted in w/c to room.  Performed slide board transfer back to bed with +2 mod A.  Sit to supine with mod to max A.  Left in supine with NT in the room.  Therapy Documentation Precautions:  Precautions Precautions:  Fall Precaution Comments: Significant Right sided hemi; intermittent right sided lean Restrictions Weight Bearing Restrictions: No Pain: Pain Assessment Pain Scale: 0-10 Pain Score: 0-No pain Faces Pain Scale: Hurts little more Pain Type: Acute pain Pain Location: Arm Pain Orientation: Right Pain Descriptors / Indicators: Tightness Pain Onset: With Activity Pain Intervention(s): Relaxation   Therapy/Group: Individual Therapy  Elray Mcgregor  Sheran Lawless, PT 08/19/2020, 2:56 PM

## 2020-08-19 NOTE — Progress Notes (Signed)
Occupational Therapy Weekly Progress Note  Patient Details  Name: Justin Mason MRN: [id number] Date of Birth: 07/30/1980  Beginning of progress report period: August 10, 2020 End of progress report period: August 19, 2020  Today's Date: 08/19/2020 OT Individual Time: 0370-4888 OT Individual Time Calculation (min): 56 min    Patient has met 3 of 4 short term goals.  Pt is making steady progress towards goals. Pt is currently able to complete slide board transfers with mod assist +2 to strong side, does continue to require max-total of 2 when transferring to hemiplegic side. Pt is able to complete sit > stand with mod assist +2 with UE support and is able to complete sit > stand in [facility] from elevated surface with min assist.  Pt is able to participate in LB bathing with use of figure 4 position in bed and demonstrates good sitting balance for UB bathing and dressing.   Pt is completing LB dressing at bed level at this time due to strong pushing to R in standing and due to frequent incontinence requiring hygiene at bed level.  Pt has not progressed to toileting on toilet due to incontinence with no awareness.  Have begun introducing AE for LB bathing and dressing to continue to increase participation and decrease burden of care.  Patient continues to demonstrate the following deficits: muscle weakness and muscle paralysis, decreased cardiorespiratoy endurance, motor apraxia, decreased coordination and decreased motor planning, decreased visual motor skills and field cut, decreased attention, decreased awareness, decreased problem solving, decreased safety awareness and delayed processing and decreased sitting balance, decreased standing balance, decreased postural control, hemiplegia and decreased balance strategies and therefore will continue to benefit from skilled OT intervention to enhance overall performance with BADL and Reduce care partner burden.  Patient progressing toward long term  goals..  Continue plan of care.  OT Short Term Goals Week 1:  OT Short Term Goal 1 (Week 1): Pt will complete sit<>stand at stedy with min assist in preperation for self care. OT Short Term Goal 1 - Progress (Week 1): Met OT Short Term Goal 2 (Week 1): Pt will complete UB dressing with mod assist using hemitechniques. OT Short Term Goal 2 - Progress (Week 1): Met OT Short Term Goal 3 (Week 1): Pt will complete toilet transfer with mod assist +2 using LRAD. OT Short Term Goal 3 - Progress (Week 1): Progressing toward goal OT Short Term Goal 4 (Week 1): Pt will bathe UB/LB with mod assist using long handled sponge. OT Short Term Goal 4 - Progress (Week 1): Met Week 2:  OT Short Term Goal 1 (Week 2): Pt will don pants with mod assist using AE as needed OT Short Term Goal 2 (Week 2): Pt will complete toilet transfer with mod assist +2 using LRAD. OT Short Term Goal 3 (Week 2): Pt will complete sit > stand with UE support with mod assist of one caregiver to progress towards LB dressing at sit > stand level  Skilled Therapeutic Interventions/Progress Updates:    Treatment session with focus on self-care retraining, functional mobility, and attention to RUE.  Pt received upright in bed finishing breakfast.  Pt agreeable to bathing and dressing this session.  Pt completed bathing at bed level with focus on use of AE and hemi-techniques to increase participation in self-care tasks.  Pt able to wash BLE in figure 4 position with therapist assisting to position RLE into figure 4.  Pt attempting hemi-technique with washing L underarm with LUE, still  requiring assistance for thoroughness.  Pt donned RLE in to shorts in figure 4 position and then utilized reacher to thread LLE.  Pt engage in bridging with assist to stabilize RLE while pt attempted to pull shorts over hips.  Engaged in rolling R and L to pull shorts over hips.  Pt completed bed mobility to sit to EOB with max assist this session.  Completed  remainder of UB bathing and then dressing while seated EOB.  Pt required mod cues and assist for UB dressing with hemi-dressing technique.  Completed slide board transfer bed <> w/c with pt able to complete with mod assist +2 and then requiring max assist +2 when transferring back to R.  Therapist providing multimodal cues and facilitation for anterior weight shift during transfers.  Pt completed oral care while seated upright in w/c with setup assist.  Pt then returned to supine at end of session due to long break between therapy sessions.  Pt positioned with pillow under RUE for improved positioning and left with all needs in reach.  Therapy Documentation Precautions:  Precautions Precautions: Fall Precaution Comments: Significant Right sided hemi; intermittent right sided lean Restrictions Weight Bearing Restrictions: No Pain:  Pt with no c/o pain  Therapy/Group: Individual Therapy  Simonne Come 08/19/2020, 8:29 AM

## 2020-08-19 NOTE — Progress Notes (Signed)
[facility] PHYSICAL MEDICINE & REHABILITATION PROGRESS NOTE  Subjective/Complaints: Patient seen laying in bed this morning.  He slept well overnight.  Incontinent episode overnight.  ROS: limited due to language    Objective: Vital Signs: Blood pressure 115/60, pulse 81, temperature 99.3 F (37.4 C), temperature source Oral, resp. rate 18, height 6\' 1"  (1.854 m), weight (!) 157.3 kg, SpO2 97 %. No results found. Recent Labs    08/18/20 1006 08/19/20 0455  WBC 8.5 8.2  HGB 12.8* 11.6*  HCT 35.2* 33.5*  PLT 170 167   Recent Labs    08/19/20 0455  NA 133*  K 3.7  CL 97*  CO2 25  GLUCOSE 170*  BUN 17  CREATININE 0.83  CALCIUM 9.2    Intake/Output Summary (Last 24 hours) at 08/19/2020 0856 Last data filed at 08/19/2020 0813 Gross per 24 hour  Intake 1240 ml  Output --  Net 1240 ml     Pressure Injury 08/09/20 Coccyx Mid Stage 2 -  Partial thickness loss of dermis presenting as a shallow open injury with a red, pink wound bed without slough. (Active)  08/09/20 1555  Location: Coccyx  Location Orientation: Mid  Staging: Stage 2 -  Partial thickness loss of dermis presenting as a shallow open injury with a red, pink wound bed without slough.  Wound Description (Comments):   Present on Admission: Yes     Pressure Injury 08/09/20 Buttocks Left;Right Deep Tissue Pressure Injury - Purple or maroon localized area of discolored intact skin or blood-filled blister due to damage of underlying soft tissue from pressure and/or shear. (Active)  08/09/20 1716  Location: Buttocks  Location Orientation: Left;Right  Staging: Deep Tissue Pressure Injury - Purple or maroon localized area of discolored intact skin or blood-filled blister due to damage of underlying soft tissue from pressure and/or shear.  Wound Description (Comments):   Present on Admission: Yes    Physical Exam: BP 115/60 (BP Location: Left Arm)   Pulse 81   Temp 99.3 F (37.4 C) (Oral)   Resp 18   Ht 6\' 1"   (1.854 m)   Wt (!) 157.3 kg   SpO2 97%   BMI 45.75 kg/m  Constitutional: No distress . Vital signs reviewed. HENT: Normocephalic.  Atraumatic. Eyes: EOMI. No discharge. Cardiovascular: No JVD.  RRR. Respiratory: Normal effort.  No stridor.  Bilateral clear to auscultation. GI: Non-distended.  BS +. Skin: Warm and dry.   Buttock ulcers not examined today Psych: Appears to have be normal mood.  Normal behavior. Musc: Right hand edema.  No tenderness in extremities. Neuro: Alert Expressive aphasia Nonverbal, unchanged  Motor: RUE 0/5 proximal distal, unchanged RLE: 2-/5 proximal to distal  No increase in tone noted  Assessment/Plan: 1. Functional deficits which require 3+ hours per day of interdisciplinary therapy in a comprehensive inpatient rehab setting.  Physiatrist is providing close team supervision and 24 hour management of active medical problems listed below.  Physiatrist and rehab team continue to assess barriers to discharge/monitor patient progress toward functional and medical goals   Care Tool:  Bathing    Body parts bathed by patient: Right arm,Chest,Abdomen,Left upper leg,Face,Right upper leg,Right lower leg,Left lower leg   Body parts bathed by helper: Left arm     Bathing assist Assist Level: Moderate Assistance - Patient 50 - 74%     Upper Body Dressing/Undressing Upper body dressing   What is the patient wearing?: Pull over shirt    Upper body assist Assist Level: Minimal Assistance -  Patient > 75%    Lower Body Dressing/Undressing Lower body dressing      What is the patient wearing?: Pants     Lower body assist Assist for lower body dressing: Maximal Assistance - Patient 25 - 49%     Toileting Toileting    Toileting assist Assist for toileting: 2 Helpers Assistive Device Comment: Bedrails   Transfers Chair/bed transfer  Transfers assist  Chair/bed transfer activity did not occur: Safety/medical concerns  Chair/bed transfer  assist level: Moderate Assistance - Patient 50 - 74% (slideboard)     Locomotion Ambulation   Ambulation assist   Ambulation activity did not occur: Safety/medical concerns          Walk 10 feet activity   Assist  Walk 10 feet activity did not occur: Safety/medical concerns        Walk 50 feet activity   Assist Walk 50 feet with 2 turns activity did not occur: Safety/medical concerns         Walk 150 feet activity   Assist Walk 150 feet activity did not occur: Safety/medical concerns         Walk 10 feet on uneven surface  activity   Assist Walk 10 feet on uneven surfaces activity did not occur: Safety/medical concerns         Wheelchair     Assist Will patient use wheelchair at discharge?: Yes Type of Wheelchair: Manual    Wheelchair assist level: Minimal Assistance - Patient > 75% Max wheelchair distance: 100    Wheelchair 50 feet with 2 turns activity    Assist        Assist Level: Minimal Assistance - Patient > 75%   Wheelchair 150 feet activity     Assist      Assist Level: Moderate Assistance - Patient 50 - 74%   Medical Problem List and Plan: 1.  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  Continue CIR  WHO/PRAFO nightly 2.  Antithrombotics: -DVT/anticoagulation:  Pharmaceutical: Lovenox  Dopplers negative for DVT             -antiplatelet therapy: DAPT X 3 months followed by ASA alone (changed to 81)--Plavix added on 08/09/20 3. Pain Management: Mobic DC'd  Controlled on 2/14             Monitor with increased exertion 4. Mood: LCSW to follow for evaluation and support.              -antipsychotic agents: N/A 5. Neuropsych: This patient is not fully capable of making decisions on his own behalf. 6. Skin/Wound Care: Routine pressure relief measures.  7. Fluids/Electrolytes/Nutrition: Monitor  I/Os.  8. HTN: Monitor BP tid--continue Catapres twice daily, Lopressor bid.   HCTZ daily, DC'd on 2/7  Avapro decreased to 150 on 2/8  Controlled on 2/14             Monitor with increased mobility 9. T2DM with hyperglycemia: Hgb A1c-6.2.   CBG (last 3)  Recent Labs    08/18/20 1629 08/18/20 2126 08/19/20 0608  GLUCAP 99 137* 177*      Metformin 1000 mg bid  Farxiga put on hold due to UTI (contraindicated).   Amaryl 1mg  daily added by weekend physician  Slightly labile on 2/14 10. Covid 19--Positive  on 07/24/20-->no treatment as asymptomatic. Continue zinc and vitamin C.  11. Morbid obesity: BMI 46. Educate on appropriate diet and exercise  to help promote health and mobility.              Encourage weight loss 12.  Hyperlipidemia: Crestor 13. H/o gout: continue allopurinol.  14. Hyponatremia  Na+ 133 on 2/14  Continue to monitor 15. AKI:   Cr.  0.83 on 2/14  Received IVF x3 nights ended on 2/11, improved afterward  Echo reviewed-EF 60-65%  Encourage fluids, reminded 16. Elevated liver enzymes: Resolved  ALT within normal limits on 2/11 17. Mild leukocytosis: Resolved  WBCs 8.2 on 2/14  Encourage IS as possible, OOB  See #18 18.  Acute lower UTI  UA +, urine culture showing E. coli  Continue Macrobid started on 2/8   LOS: 10 days A FACE TO FACE EVALUATION WAS PERFORMED  Raechelle Sarti Karis Juba 08/19/2020, 8:56 AM

## 2020-08-20 LAB — GLUCOSE, CAPILLARY
Glucose-Capillary: 163 mg/dL — ABNORMAL HIGH (ref 70–99)
Glucose-Capillary: 197 mg/dL — ABNORMAL HIGH (ref 70–99)
Glucose-Capillary: 120 mg/dL — ABNORMAL HIGH (ref 70–99)
Glucose-Capillary: 176 mg/dL — ABNORMAL HIGH (ref 70–99)

## 2020-08-20 MED ORDER — DAPAGLIFLOZIN PROPANEDIOL 10 MG PO TABS
10.0000 mg | ORAL_TABLET | Freq: Every day | ORAL | Status: DC
  Administered 2020-08-20 – 2020-09-06 (×18): 10 mg via ORAL
  Filled 2020-08-20 (×18): qty 1

## 2020-08-20 NOTE — Progress Notes (Signed)
Physical Therapy Session Note  Patient Details  Name: Justin Mason MRN: [id number] Date of Birth: 31-Oct-1980  Today's Date: 08/20/2020 PT Individual Time: 5465-0354 PT Individual Time Calculation (min): 69 min   Short Term Goals: Week 1:  PT Short Term Goal 1 (Week 1): Pt will perform bed mobility with mod assist PT Short Term Goal 1 - Progress (Week 1): Progressing toward goal PT Short Term Goal 2 (Week 1): Pt will trasnfer to and from [facility] with max assist of 1 without stedy PT Short Term Goal 2 - Progress (Week 1): Met PT Short Term Goal 3 (Week 1): Pt will remain in WC >2 hours between therapies PT Short Term Goal 3 - Progress (Week 1): Met Week 2:  PT Short Term Goal 1 (Week 2): Pt will perform bed mobility with mod assist PT Short Term Goal 2 (Week 2): Pt will transfer bed<>WC with LRAD and min A of 1 PT Short Term Goal 3 (Week 2): pt will initiate gait training  Skilled Therapeutic Interventions/Progress Updates:   Received pt sitting in WC, pt continues to remain non-verbal and communicates appropriately through thumbs up/down gestures. Pt agreeable to therapy and initially denied any pain but upon further assessment and information from girlfriend, Justin Mason, pt c/o pain with R elbow and wrist flexion. Noted, nodule along R elbow and increased swelling and warmth in R hand. RN and MD notified. Provided pt with elbow sleeve for pressure relief and donned R elbow sleeve with total A +2 provided by Justin Mason. Session with emphasis on functional mobility/transfers, generalized strengthening, dynamic standing balance/coordination, NMR, motor control/planning, and improved activity tolerance. Pt transported to therapy gym in [facility] total A and begun transfer WC<>mat via sideboard to L with mod/max A +2. Pt required max cues for head/hips relationship, anterior weight shifting, and hand placement on board. Pt became tearful and emotional from current medical state. Therapist provided emotional support and  encouragement. Pt transferred sit<>stand in [place] with heavy max A +2 x 2 trials with cues for anterior weight shifting. Sitting in stedy, worked on dynamic sitting balance and trunk control reaching outside BOS at various heights and angles for horseshoes and tossing them to target with LUE with total A to manage RUE. Returned to sitting on mat in between trials for rest break and pt transferred Stedy<>WC dependently due to fatigue. Pt transported back to room in [facility] total A. Concluded session with pt sitting in WC, needs within reach, and seatbelt alarm on. RN and girlfriend, Justin Mason, present at bedside  Therapy Documentation Precautions:  Precautions Precautions: Fall Precaution Comments: Significant Right sided hemi; intermittent right sided lean Restrictions Weight Bearing Restrictions: No  Therapy/Group: Individual Therapy Essie Gehret PT, DPT   08/20/2020, 7:22 AM

## 2020-08-20 NOTE — Progress Notes (Signed)
Physical Therapy Session Note  Patient Details  Name: Justin Mason MRN: [id number] Date of Birth: 1980-11-25  Today's Date: 08/20/2020 PT Individual Time: 1350-1445 PT Individual Time Calculation (min): 55 min   Short Term Goals: Week 2:  PT Short Term Goal 1 (Week 2): Pt will perform bed mobility with mod assist PT Short Term Goal 2 (Week 2): Pt will transfer bed<>WC with LRAD and min A of 1 PT Short Term Goal 3 (Week 2): pt will initiate gait training  Skilled Therapeutic Interventions/Progress Updates:   Patient up in w/c with girlfriend in the room.  States up since prior to lunch in chair.  Pushed in w/c to therapy gym.  Slide board transfer to L with +2 A for board placement and initiating scooting with mod A of 2.  Patient assisted into maxi sky walking sling and attempted sit to stand to [facility] walker with sling, but kept lifting hip up so removed.  Sit to stand x 4 to [facility] walker from elevated mat with +2 mod to max A.  Patient standing with UE support and mod A x about 45-70 seconds each trial, last with A for R knee extension and each time with lateral weight shifts.  Patient performed slide board transfer to R back to chair with +2 A mod A overall.  Patient assisted to room in w/c and SBT to R to bed with mod A +2.  Sit to supine mod A and left with call bell in reach and bed alarm active.    Therapy Documentation Precautions:  Precautions Precautions: Fall Precaution Comments: Significant Right sided hemi; intermittent right sided lean Restrictions Weight Bearing Restrictions: No Pain: Pain Assessment Pain Scale: 0-10 Pain Score: 0-No pain   Therapy/Group: Individual Therapy  Elray Mcgregor  [place], [place] 08/20/2020, 4:25 PM

## 2020-08-20 NOTE — Progress Notes (Signed)
Occupational Therapy Session Note  Patient Details  Name: Justin Mason MRN: [id number] Date of Birth: [birth year]  Today's Date: 08/20/2020 OT Individual Time: 1443-1540 OT Individual Time Calculation (min): 56 min    Short Term Goals: Week 2:  OT Short Term Goal 1 (Week 2): Pt will don pants with mod assist using AE as needed OT Short Term Goal 2 (Week 2): Pt will complete toilet transfer with mod assist +2 using LRAD. OT Short Term Goal 3 (Week 2): Pt will complete sit > stand with UE support with mod assist of one caregiver to progress towards LB dressing at sit > stand level  Skilled Therapeutic Interventions/Progress Updates:    Treatment session with focus on self-care retraining, functional transfers, and sit > stand. Pt received upright in bed finishing breakfast.  Pt agreeable to therapy session.  Donned pants with therapist assisting to position RLE in figure 4, pt then able to thread R pant leg.  Pt donned LLE with use of reacher to reach towards foot.  Engaged in bridging with pt able to advance pants over thighs and up towards hips.  Engaged in rolling with max assist to pull pants over hips.  Max assist sidelying to sitting at EOB.  Engaged in scooting along EOB in preparation for transfer to w/c.  Pt demonstrating decreased anterior weight shift requiring facilitation.  Completed slide board transfer with mod assist +2 for safety due to decreased anterior weight shift.  Attempted sit > stand x2 at sink in room with pt unable to weight shift forward or push up at all.  Therefore utilized [place] with pt requiring max-total +3 to push up in to standing in [place].  Pt sat on Stedy flaps with visual feedback from mirror for midline sitting balance.  Required heavy +2 assist to stand from flaps in preparation for returning to sitting in w/c.  Pt remained upright in w/c with seat belt alarm on and all needs in reach.  Girlfriend in room and end of session.  Therapy Documentation Precautions:   Precautions Precautions: Fall Precaution Comments: Significant Right sided hemi; intermittent right sided lean Restrictions Weight Bearing Restrictions: No General:   Vital Signs: Therapy Vitals Pulse Rate: 70 BP: 136/76 Pain: Pain Assessment Pain Scale: 0-10 Pain Score: 0-No pain   Therapy/Group: Individual Therapy  Rosalio Loud 08/20/2020, 12:21 PM

## 2020-08-20 NOTE — Progress Notes (Signed)
[facility] PHYSICAL MEDICINE & REHABILITATION PROGRESS NOTE  Subjective/Complaints: Patient seen laying in bed this AM.  He indicates he slept well overnight. No reported issues overnight.   ROS: limited due to language.   Objective: Vital Signs: Blood pressure 136/76, pulse 70, temperature 98 F (36.7 C), resp. rate 18, height 6\' 1"  (1.854 m), weight (!) 157.3 kg, SpO2 100 %. No results found. Recent Labs    08/18/20 1006 08/19/20 0455  WBC 8.5 8.2  HGB 12.8* 11.6*  HCT 35.2* 33.5*  PLT 170 167   Recent Labs    08/19/20 0455  NA 133*  K 3.7  CL 97*  CO2 25  GLUCOSE 170*  BUN 17  CREATININE 0.83  CALCIUM 9.2    Intake/Output Summary (Last 24 hours) at 08/20/2020 08/22/2020 Last data filed at 08/19/2020 1337 Gross per 24 hour  Intake 240 ml  Output --  Net 240 ml     Pressure Injury 08/09/20 Coccyx Mid Stage 2 -  Partial thickness loss of dermis presenting as a shallow open injury with a red, pink wound bed without slough. (Active)  08/09/20 1555  Location: Coccyx  Location Orientation: Mid  Staging: Stage 2 -  Partial thickness loss of dermis presenting as a shallow open injury with a red, pink wound bed without slough.  Wound Description (Comments):   Present on Admission: Yes     Pressure Injury 08/09/20 Buttocks Left;Right Deep Tissue Pressure Injury - Purple or maroon localized area of discolored intact skin or blood-filled blister due to damage of underlying soft tissue from pressure and/or shear. (Active)  08/09/20 1716  Location: Buttocks  Location Orientation: Left;Right  Staging: Deep Tissue Pressure Injury - Purple or maroon localized area of discolored intact skin or blood-filled blister due to damage of underlying soft tissue from pressure and/or shear.  Wound Description (Comments):   Present on Admission: Yes    Physical Exam: BP 136/76   Pulse 70   Temp 98 F (36.7 C)   Resp 18   Ht 6\' 1"  (1.854 m)   Wt (!) 157.3 kg   SpO2 100%   BMI 45.75  kg/m   Constitutional: No distress . Vital signs reviewed. HENT: Normocephalic.  Atraumatic. Eyes: EOMI. No discharge. Cardiovascular: No JVD.  RRR. Respiratory: Normal effort.  No stridor.  Bilateral clear to auscultation. GI: Non-distended.  BS +. Skin: Warm and dry.   Buttock ulcers not examined today. Psych: Normal mood.  Normal behavior. Musc: Right hand edema. No tenderness in extremities. Neuro: Alert Expressive aphasia Nonverbal, stable Motor: RUE 0/5 proximal distal, stable RLE: 2-/5 proximal to distal, stable No increase in tone noted  Assessment/Plan: 1. Functional deficits which require 3+ hours per day of interdisciplinary therapy in a comprehensive inpatient rehab setting.  Physiatrist is providing close team supervision and 24 hour management of active medical problems listed below.  Physiatrist and rehab team continue to assess barriers to discharge/monitor patient progress toward functional and medical goals   Care Tool:  Bathing    Body parts bathed by patient: Right arm,Chest,Abdomen,Left upper leg,Face,Right upper leg,Right lower leg,Left lower leg   Body parts bathed by helper: Left arm     Bathing assist Assist Level: Moderate Assistance - Patient 50 - 74%     Upper Body Dressing/Undressing Upper body dressing   What is the patient wearing?: Pull over shirt    Upper body assist Assist Level: Moderate Assistance - Patient 50 - 74%    Lower Body Dressing/Undressing Lower  body dressing      What is the patient wearing?: Pants     Lower body assist Assist for lower body dressing: Moderate Assistance - Patient 50 - 74%     Toileting Toileting    Toileting assist Assist for toileting: 2 Helpers Assistive Device Comment: Bedrails   Transfers Chair/bed transfer  Transfers assist  Chair/bed transfer activity did not occur: Safety/medical concerns  Chair/bed transfer assist level: 2 Helpers     Locomotion Ambulation   Ambulation  assist   Ambulation activity did not occur: Safety/medical concerns          Walk 10 feet activity   Assist  Walk 10 feet activity did not occur: Safety/medical concerns        Walk 50 feet activity   Assist Walk 50 feet with 2 turns activity did not occur: Safety/medical concerns         Walk 150 feet activity   Assist Walk 150 feet activity did not occur: Safety/medical concerns         Walk 10 feet on uneven surface  activity   Assist Walk 10 feet on uneven surfaces activity did not occur: Safety/medical concerns         Wheelchair     Assist Will patient use wheelchair at discharge?: Yes Type of Wheelchair: Manual    Wheelchair assist level: Minimal Assistance - Patient > 75% Max wheelchair distance: 100    Wheelchair 50 feet with 2 turns activity    Assist        Assist Level: Minimal Assistance - Patient > 75%   Wheelchair 150 feet activity     Assist      Assist Level: Moderate Assistance - Patient 50 - 74%   Medical Problem List and Plan: 1.  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  Continue CIR  WHO/PRAFO nightly 2.  Antithrombotics: -DVT/anticoagulation:  Pharmaceutical: Lovenox  Dopplers negative for DVT             -antiplatelet therapy: DAPT X 3 months followed by ASA alone (changed to 81)--Plavix added on 08/09/20 3. Pain Management: Mobic DC'd  Controlled on 2/15             Monitor with increased exertion 4. Mood: LCSW to follow for evaluation and support.              -antipsychotic agents: N/A 5. Neuropsych: This patient is not fully capable of making decisions on his own behalf. 6. Skin/Wound Care: Routine pressure relief measures.  7. Fluids/Electrolytes/Nutrition: Monitor I/Os.  8. HTN: Monitor BP tid--continue Catapres twice daily, Lopressor bid.   HCTZ daily, DC'd on  2/7  Avapro decreased to 150 on 2/8  Slightly labile on 2/15, monitor for trend             Monitor with increased mobility 9. T2DM with hyperglycemia: Hgb A1c-6.2.   CBG (last 3)  Recent Labs    08/19/20 1707 08/19/20 2106 08/20/20 0554  GLUCAP 95 166* 163*      Metformin 1000 mg bid  Farxiga put on hold due to UTI (contraindicated).   Amaryl 1mg  daily added by weekend physician  Slightly labile on 2/15, monitor for trend 10. Covid 19--Positive  on 07/24/20-->no treatment as asymptomatic. Continue zinc and vitamin C.  11. Morbid obesity: BMI 46. Educate on appropriate diet and exercise to help promote health and mobility.  Encourage weight loss 12.  Hyperlipidemia: Crestor 13. H/o gout: continue allopurinol.  14. Hyponatremia  Na+ 133 on 2/14  Continue to monitor 15. AKI:   Cr.  0.83 on 2/14, plan to repeat labs later this week  Received IVF x3 nights ended on 2/11, improved afterward  Echo reviewed-EF 60-65%  Encourage fluids, reminded 16. Elevated liver enzymes: Resolved  ALT within normal limits on 2/11 17. Mild leukocytosis: Resolved  WBCs 8.2 on 2/14  Encourage IS as possible, OOB  See #18 18.  Acute lower UTI  UA +, urine culture showing E. coli  Continue Macrobid started on 2/8  LOS: 11 days A FACE TO FACE EVALUATION WAS PERFORMED  Hideko Esselman Karis Juba 08/20/2020, 8:29 AM

## 2020-08-20 NOTE — Progress Notes (Incomplete)
Resting oriented x2, wife expresses concerns with discomfort to left shoulder when he is attempting ro raise his arm to assist in feeding himself, Right arm/hand elevated on pillows due to increase swelling especially in right hand, coloration adequate, made as comfortable as possible

## 2020-08-21 ENCOUNTER — Inpatient Hospital Stay (HOSPITAL_COMMUNITY): Payer: Managed Care, Other (non HMO)

## 2020-08-21 LAB — GLUCOSE, CAPILLARY
Glucose-Capillary: 165 mg/dL — ABNORMAL HIGH (ref 70–99)
Glucose-Capillary: 129 mg/dL — ABNORMAL HIGH (ref 70–99)
Glucose-Capillary: 118 mg/dL — ABNORMAL HIGH (ref 70–99)
Glucose-Capillary: 147 mg/dL — ABNORMAL HIGH (ref 70–99)

## 2020-08-21 IMAGING — CR DG FOREARM 2V*R*
2 series · 2 of 2 positions shown · non-contrast
Comparison: None.

CLINICAL DATA: Pain in the posterior side of the right forearm.

EXAM:
RIGHT FOREARM - 2 VIEW

[forearm ap (1 of 2)]
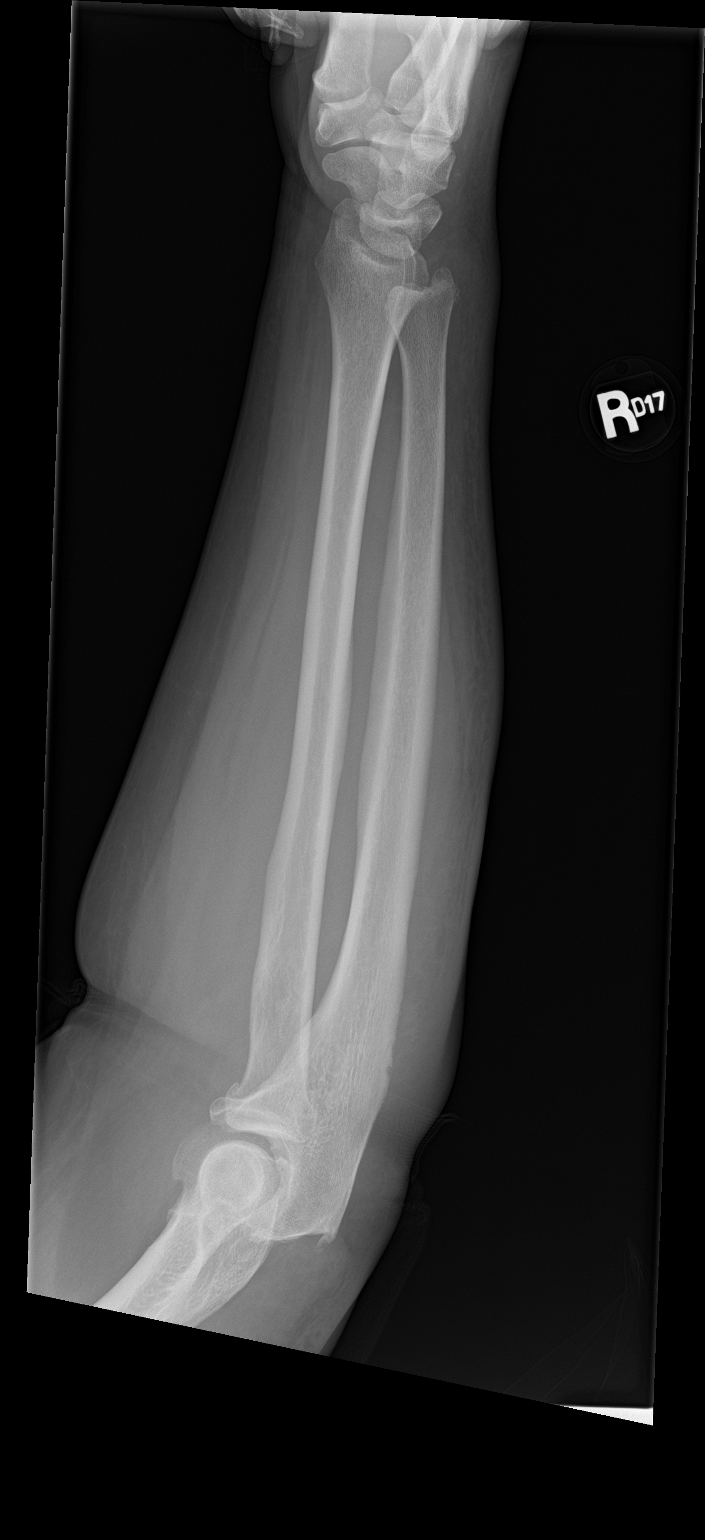

[forearm ap (2 of 2)]
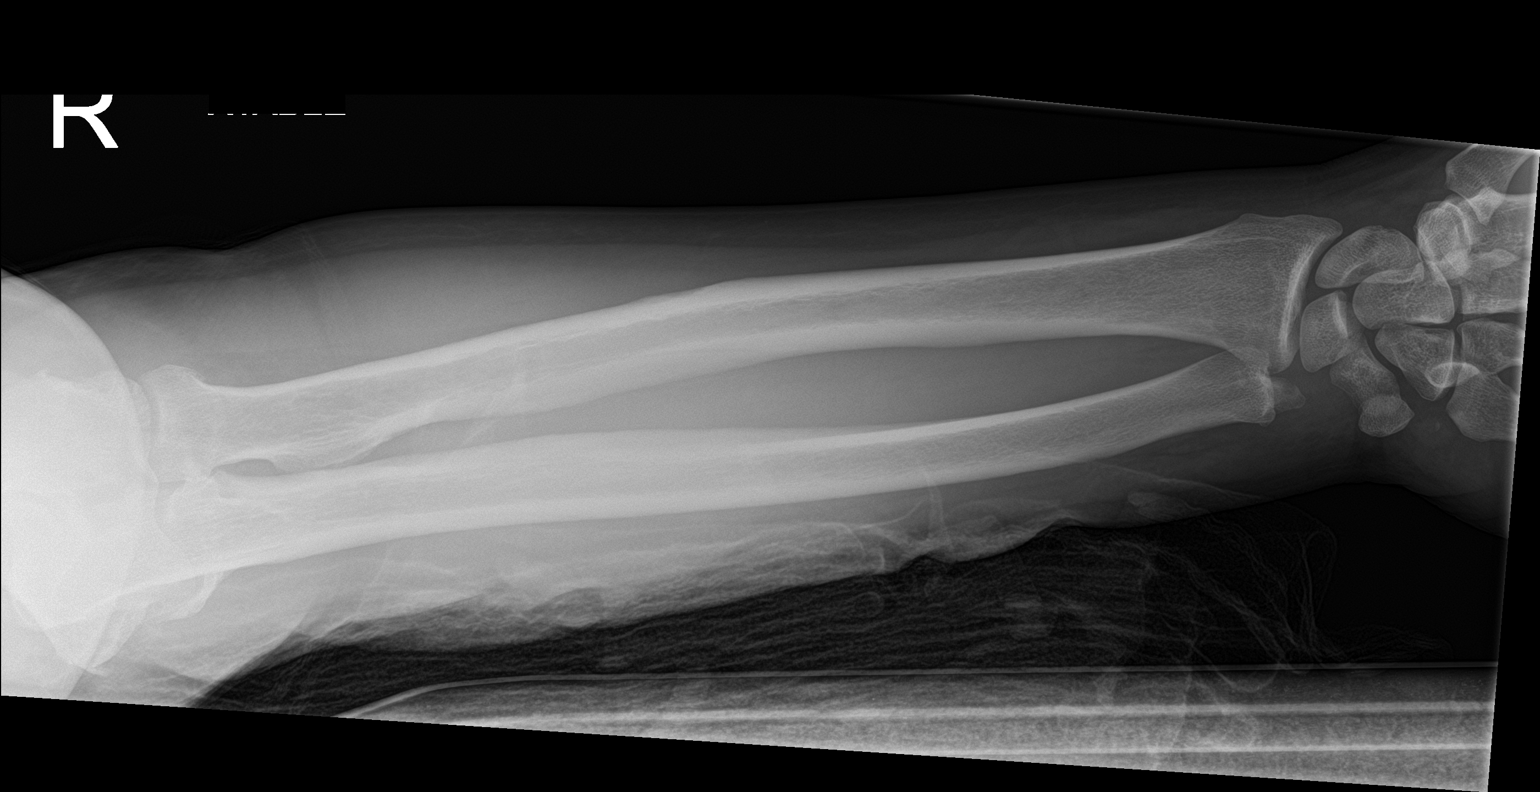

[2 of 2 positions shown; findings below may reference images not displayed]

FINDINGS: There is nonspecific soft tissue swelling about the forearm. There
is no acute displaced fracture or dislocation. There are subtle
cortical erosions involving the proximal ulna. There are
degenerative changes of the elbow. There is no radiopaque foreign
body.
IMPRESSION: 1. Soft tissue swelling without evidence for an acute displaced
fracture or dislocation.
2. Subtle cortical erosions of the proximal ulna. If there is
clinical concern for osteomyelitis, follow-up with a
contrast-enhanced MRI is recommended.
3. Degenerative changes are noted of the elbow.
4. No radiopaque foreign body.

## 2020-08-21 MED ORDER — ENSURE ENLIVE PO LIQD
237.0000 mL | Freq: Two times a day (BID) | ORAL | Status: DC
  Administered 2020-08-21 – 2020-09-06 (×29): 237 mL via ORAL
  Filled 2020-08-21 (×3): qty 237

## 2020-08-21 NOTE — Patient Care Conference (Signed)
Inpatient RehabilitationTeam Conference and Plan of Care Update Date: 08/21/2020   Time: 11:50 AM    Patient Name: Justin Mason      Medical Record Number: [id number]  Date of Birth: 10-19-1980 Sex: Male         Room/Bed: 4W02C/4W02C-01 Payor Info: Payor: CIG[employer] / Plan: CIGNA MANAGED / Product Type: *No Product type* /    Admit Date/Time:  08/09/2020  3:32 PM  Primary Diagnosis:  Acute cerebral infarction [facility])  Hospital Problems: Principal Problem:   Acute cerebral infarction [facility]) Active Problems:   Pressure injury of skin   Hyponatremia   AKI (acute kidney injury) (HCC)   Transaminitis   Benign essential HTN   Labile blood pressure   Right hemiplegia (HCC)   Acute lower UTI   Right hemiparesis [facility])    Expected Discharge Date: Expected Discharge Date: 09/06/20  Team Members Present: Physician leading conference: Dr. Maryla Morrow Care Coodinator Present: Dossie Der, LCSW;Lovel Suazo Lambert Mody, RN, BSN, CRRN Nurse Present: Konrad Dolores, RN PT Present: Raechel Chute, PT OT Present: Rosalio Loud, OT SLP Present: Elio Forget, SLP PPS Coordinator present : Fae Pippin, SLP     Current Status/Progress Goal Weekly Team Focus  Bowel/Bladder   Patient is Incont of Bladder/Bowel  Pt will regain continence.  QS/PRN assessment   Swallow/Nutrition/ Hydration             ADL's   Mod A LB bathing and dressing at bed level, mod A UB dressing, Mod A +2 sit > stand - pt with heavy R lean and R instability in standing, Mod A slide board to L and Max +2 slide board to R  Min A bathing and UB dressing; Mod A LB dressing, toileting and toilet transfers  ADL retraining, sit > stand, R NMR, transfers, activity tolerance   Mobility   bed mobility max A, slideboard transfer to R max +2 and to L mod of 1, sit<>stand max A to +2 assist, WC mobility 114ft min A  mod A transfers. Min assist bed mobility  functional mobility/transfers, generalized strengthening, dynamic standing balance/coordination,  gait training, improved activity tolerance.   Communication             Safety/Cognition/ Behavioral Observations            Pain   Pain to right shoulder, arm,and hand indicate pain level 9/10 Tylenol 650mg  administered, slight relief with f/u  Pain < 0  Pain assess QS/PRN   Skin   Skin intact, has MASD to coccyx area, Gerhardt Cream applied  Remain free of skin breakdown  QSPRN assessment, paitient is a heavy sweater, will need skin frequently during shift,     Discharge Planning:  Family aware of 24/7 care and plan to provide this, have already started building ramp and will need extensive family education closer to DC   Team Discussion: UTI treated and MD rechecking labs. MD adjusted medications for DM management. Patient is highly emotional this week and tearful. Functional decline noted since last week, with slower sequencing with activities and requires more assistance for transfers than last week. Team unsure of cause for decline in function.  Patient on target to meet rehab goals: Currently mod - max assist for transfers, min assist for wheelchair mobility, and mod assist for lower body care. +2 assist for toileting. Sever oral motor apraxia however able to mouth words and is writing some. Tolerating a regular thin liquid diet. Progress limited by pain in right elbow and patient is  inconsistent with yes/no responses to questions.  *See Care Plan and progress notes for long and short-term goals.   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  Barriers to Discharge: Behavior;Medical stability;Weight;Other (comments)   Possible Resolutions to [employer] Focus: Therapies, follow labs - Cr, Na+, optimize DM/BP meds   Continued Need for Acute Rehabilitation Level of Care: The patient requires daily medical management by a physician with specialized training in physical medicine and rehabilitation for the following reasons: Direction of a multidisciplinary physical rehabilitation program to maximize functional independence : Yes Medical management of patient stability for increased activity during participation in an intensive rehabilitation regime.: Yes Analysis of laboratory values and/or radiology reports with any subsequent need for medication adjustment and/or medical intervention. : Yes   I attest that I was present, lead the team conference, and concur with the assessment and plan of the team.   Chana Bode B 08/21/2020, 1:29 PM

## 2020-08-21 NOTE — Progress Notes (Signed)
Occupational Therapy Session Note  Patient Details  Name: Justin Mason MRN: [id number] Date of Birth: Jan 07, 1981  Today's Date: 08/21/2020 OT Individual Time: 7591-6384 OT Individual Time Calculation (min): 58 min    Short Term Goals: Week 2:  OT Short Term Goal 1 (Week 2): Pt will don pants with mod assist using AE as needed OT Short Term Goal 2 (Week 2): Pt will complete toilet transfer with mod assist +2 using LRAD. OT Short Term Goal 3 (Week 2): Pt will complete sit > stand with UE support with mod assist of one caregiver to progress towards LB dressing at sit > stand level  Skilled Therapeutic Interventions/Progress Updates:    Treatment session with focus on self-care retraining and functional transfers with focus on hemi-technique and weight shifting.  Pt received upright in bed with RN finishing administering meds.  Noted pt to be incontinent of bladder, completed hygiene at bed level with focus on rolling R and L.  Engaged in LB bathing and dressing at bed level with use of hospital bed features for improved positioning and therapist assisting with positioning in to figure 4 to allow pt to wash lower legs and don pants.  Engaged in bridging and rolling to pull pants over hips.  Max assist sidelying to sitting.  Completed slide board transfer to w/c with Mod assist +2 with max multimodal cues for anterior weight shifting and clearance of buttocks during transfer.  Pt engaged in UB bathing and dressing seated at sink with focus on hemi-technique with washing LUE and then when dressing.  Pt requires mod assistance for UB dressing with cues for sequencing and problem solving.  Pt completed oral care with setup of items.  Pt remained upright in w/c with seat belt alarm on, half lap tray for improved positioning, and all needs in reach.    Girlfriend arrived at end of session.  Discussed positioning for decreased edema with use of pillow and half lap tray.  Educated on massage for edema  management and possibility of use of kinesiotape for edema management.    Therapy Documentation Precautions:  Precautions Precautions: Fall Precaution Comments: Significant Right sided hemi; intermittent right sided lean Restrictions Weight Bearing Restrictions: No Pain:  Pt with c/o pain in R forearm near elbow when palpated.  RN aware   Therapy/Group: Individual Therapy  Rosalio Loud 08/21/2020, 12:00 PM

## 2020-08-21 NOTE — Progress Notes (Signed)
Nutrition Follow-up  DOCUMENTATION CODES:   Morbid obesity  INTERVENTION:   - Continue Ensure Enlive po BID, each supplement provides 350 kcal and 20 grams of protein  - Continue 1 packet Juven BID, each packet provides 95 calories, 2.5 grams of protein, and 9.8 grams of carbohydrate; also contains 7 grams of L-arginine and L-glutamine, 300 mg vitamin C, 15 mg vitamin E, 1.2 mcg vitamin B-12, 9.5 mg zinc, 200 mg calcium, and 1.5 g calcium Beta-hydroxy-Beta-methylbutyrate to support wound healing  - Continue MVI with minerals daily  - d/c ProSource Plus  NUTRITION DIAGNOSIS:   Increased nutrient needs related to acute illness,wound healing as evidenced by estimated needs.  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 Past medical history significant for morbid obesity, cellulitis of lower extremity, HTN, GERD, HTN, HLD, OSA, and DM2.  Spoke with pt and significant other at bedside. Wife reports pt's PO intake and appetite have improve significantly over the last week and she is pleased about this. Pt has not been drinking the Ensure supplements as frequently due to increased PO intake at meals and snacks. RD will decrease frequency from TID to BID.  Pt does not like ProSource. Will d/c this supplement since PO intake has improved. Will continue Juven at this time to aid in wound healing. Pt and significant other in agreement.  Discussed importance of adequate protein intake in promoting wound healing. Pt enjoys protein-rich foods and tends to eat these first at  mealtimes.  Weight stable compared to CIR admission weight. Pt continues to have edema. Per RN documentation, pt with mild pitting edema to RUE and moderate pitting edema to BLE.  Meal Completion: 50-100%  Medications reviewed and include: ProSource Plus BID, vitamin C, cholecalciferol, farxiga, Ensure Enlive TID, SSI, metformin, MVI with minerals, Juven BID, protonix, zinc sulfate 220 mg daily  Labs reviewed: sodium 133 CBG's: 120-197 x 24 hours  Diet Order:   Diet Order            Diet Carb Modified Fluid consistency: Thin; Room service appropriate? No  Diet effective now                 EDUCATION NEEDS:   Not appropriate for education at this time  Skin:  Skin Assessment: Skin Integrity Issues: DTI: left; right buttocks Stage II: coccyx Incisions: left arm, Other: MASD; groin, lower abdomen  Last BM:  08/20/20 multiple type 4  Height:   Ht Readings from Last 1 Encounters:  08/09/20 6\' 1"  (1.854 m)    Weight:   Wt Readings from Last 1 Encounters:  08/21/20 (!) 158.9 kg    Ideal Body Weight:  83.6 kg  BMI:  Body mass index is 46.22 kg/m.  Estimated Nutritional Needs:   Kcal:  2400-2600  Protein:  130-145  Fluid:  >/= 2.4 L    08/23/20, MS, RD, LDN Inpatient Clinical Dietitian Please see AMiON for contact information.

## 2020-08-21 NOTE — Progress Notes (Signed)
[place] PHYSICAL MEDICINE & REHABILITATION PROGRESS NOTE  Subjective/Complaints: Patient seen laying in bed this morning.  He indicates he slept well overnight.  Discussed right upper extremity with therapies.  Per therapies elbow with erythema.  Patient denies pain  ROS: limited due to language  Objective: Vital Signs: Blood pressure 134/76, pulse 64, temperature 98.1 F (36.7 C), temperature source Oral, resp. rate 18, height 6\' 1"  (1.854 m), weight (!) 158.9 kg, SpO2 98 %. No results found. Recent Labs    08/19/20 0455  WBC 8.2  HGB 11.6*  HCT 33.5*  PLT 167   Recent Labs    08/19/20 0455  NA 133*  K 3.7  CL 97*  CO2 25  GLUCOSE 170*  BUN 17  CREATININE 0.83  CALCIUM 9.2    Intake/Output Summary (Last 24 hours) at 08/21/2020 1028 Last data filed at 08/21/2020 0809 Gross per 24 hour  Intake 840 ml  Output --  Net 840 ml     Pressure Injury 08/09/20 Coccyx Mid Stage 2 -  Partial thickness loss of dermis presenting as a shallow open injury with a red, pink wound bed without slough. (Active)  08/09/20 1555  Location: Coccyx  Location Orientation: Mid  Staging: Stage 2 -  Partial thickness loss of dermis presenting as a shallow open injury with a red, pink wound bed without slough.  Wound Description (Comments):   Present on Admission: Yes     Pressure Injury 08/09/20 Buttocks Left;Right Deep Tissue Pressure Injury - Purple or maroon localized area of discolored intact skin or blood-filled blister due to damage of underlying soft tissue from pressure and/or shear. (Active)  08/09/20 1716  Location: Buttocks  Location Orientation: Left;Right  Staging: Deep Tissue Pressure Injury - Purple or maroon localized area of discolored intact skin or blood-filled blister due to damage of underlying soft tissue from pressure and/or shear.  Wound Description (Comments):   Present on Admission: Yes    Physical Exam: BP 134/76 (BP Location: Left Arm)   Pulse 64   Temp  98.1 F (36.7 C) (Oral)   Resp 18   Ht 6\' 1"  (1.854 m)   Wt (!) 158.9 kg   SpO2 98%   BMI 46.22 kg/m   Constitutional: No distress . Vital signs reviewed. HENT: Normocephalic.  Atraumatic. Eyes: EOMI. No discharge. Cardiovascular: No JVD.  RRR. Respiratory: Normal effort.  No stridor.  Bilateral clear to auscultation. GI: Non-distended.  BS +. Skin: Warm and dry.   Buttock ulcer not examined today. No erythema to right elbow Psych: Normal mood.  Normal behavior. Musc: Right upper extremity edema No tenderness in extremities. Neuro: Alert Expressive aphasia, unchanged Nonverbal, stable Motor: RUE 0/5 proximal distal, stable RLE: 2-/5 proximal to distal, unchanged No increase in tone noted  Assessment/Plan: 1. Functional deficits which require 3+ hours per day of interdisciplinary therapy in a comprehensive inpatient rehab setting.  Physiatrist is providing close team supervision and 24 hour management of active medical problems listed below.  Physiatrist and rehab team continue to assess barriers to discharge/monitor patient progress toward functional and medical goals   Care Tool:  Bathing    Body parts bathed by patient: Right arm,Chest,Abdomen,Left upper leg,Face,Right upper leg,Right lower leg,Left lower leg   Body parts bathed by helper: Left arm     Bathing assist Assist Level: Moderate Assistance - Patient 50 - 74%     Upper Body Dressing/Undressing Upper body dressing   What is the patient wearing?: Pull over shirt  Upper body assist Assist Level: Moderate Assistance - Patient 50 - 74%    Lower Body Dressing/Undressing Lower body dressing      What is the patient wearing?: Pants     Lower body assist Assist for lower body dressing: Moderate Assistance - Patient 50 - 74%     Toileting Toileting    Toileting assist Assist for toileting: 2 Helpers Assistive Device Comment: Bedrails   Transfers Chair/bed transfer  Transfers assist   Chair/bed transfer activity did not occur: Safety/medical concerns  Chair/bed transfer assist level: 2 Helpers     Locomotion Ambulation   Ambulation assist   Ambulation activity did not occur: Safety/medical concerns          Walk 10 feet activity   Assist  Walk 10 feet activity did not occur: Safety/medical concerns        Walk 50 feet activity   Assist Walk 50 feet with 2 turns activity did not occur: Safety/medical concerns         Walk 150 feet activity   Assist Walk 150 feet activity did not occur: Safety/medical concerns         Walk 10 feet on uneven surface  activity   Assist Walk 10 feet on uneven surfaces activity did not occur: Safety/medical concerns         Wheelchair     Assist Will patient use wheelchair at discharge?: Yes Type of Wheelchair: Manual    Wheelchair assist level: Minimal Assistance - Patient > 75% Max wheelchair distance: 100    Wheelchair 50 feet with 2 turns activity    Assist        Assist Level: Minimal Assistance - Patient > 75%   Wheelchair 150 feet activity     Assist      Assist Level: Moderate Assistance - Patient 50 - 74%   Medical Problem List and Plan: 1.  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  Continue CIR  WHO/PRAFO nightly  Team conference today to discuss current and goals and coordination of care, home and environmental barriers, and discharge planning with nursing, case manager, and therapies. Please see conference note from today as well.  2.  Antithrombotics: -DVT/anticoagulation:  Pharmaceutical: Lovenox  Dopplers negative for DVT             -antiplatelet therapy: DAPT X 3 months followed by ASA alone (changed to 81)--Plavix added on 08/09/20 3. Pain Management: Mobic DC'd  Controlled on 2/16             Monitor with increased exertion 4.  Mood: LCSW to follow for evaluation and support.              -antipsychotic agents: N/A 5. Neuropsych: This patient is not fully capable of making decisions on his own behalf. 6. Skin/Wound Care: Routine pressure relief measures.  7. Fluids/Electrolytes/Nutrition: Monitor I/Os.  8. HTN: Monitor BP tid--continue Catapres twice daily, Lopressor bid.   HCTZ daily, DC'd on 2/7  Avapro decreased to 150 on 2/8  Relatively controlled on 2/16             Monitor with increased mobility 9. T2DM with hyperglycemia: Hgb A1c-6.2.   CBG (last 3)  Recent Labs    08/20/20 1639 08/20/20 2138 08/21/20 0557  GLUCAP 120* 176* 165*      Metformin 1000 mg bid  Farxiga restarted  Amaryl DC'd  Elevated on 2/16,  continue to monitor for trend 10. Covid 19--Positive  on 07/24/20-->no treatment as asymptomatic. Continue zinc and vitamin C.  11. Morbid obesity: BMI 46. Educate on appropriate diet and exercise to help promote health and mobility.              Encourage weight loss 12.  Hyperlipidemia: Crestor 13. H/o gout: continue allopurinol.  14. Hyponatremia  Na+ 133 on 2/14  Continue to monitor 15. AKI:   Cr.  0.83 on 2/14, plan to repeat labs later this week  Received IVF x3 nights ended on 2/11, improved afterward  Echo reviewed-EF 60-65%  Encourage fluids, reminded 16. Elevated liver enzymes: Resolved  ALT within normal limits on 2/11 17. Mild leukocytosis: Resolved  WBCs 8.2 on 2/14  Encourage IS as possible, OOB  See #18 18.  Acute lower UTI  UA +, urine culture showing E. coli  Completed course of Macrobid   LOS: 12 days A FACE TO FACE EVALUATION WAS PERFORMED  Chizuko Trine Karis Juba 08/21/2020, 10:28 AM

## 2020-08-21 NOTE — Progress Notes (Signed)
Speech Language Pathology Daily Session Note  Patient Details  Name: Justin Mason MRN: [id number] Date of Birth: 1981-04-09  Today's Date: 08/21/2020 SLP Individual Time: 1000-1100 SLP Individual Time Calculation (min): 60 min  Short Term Goals: Week 2: SLP Short Term Goal 1 (Week 2): Patient will imitate to mouth bilabial and lingual speech motor movements at CV (consonant-vowel) and CVCV word level with modA. SLP Short Term Goal 2 (Week 2): Patient will utilize word/picture communication boards to request and respond to wants/needs questions with setup and supervision A. SLP Short Term Goal 3 (Week 2): Patient will write 3-letter words to name objects/pictures with modA cues. SLP Short Term Goal 4 (Week 2): Patient will imitate to produce voiceless phonemes /h/ in initial position of CV (consonant vowel) words with maxA.  Skilled Therapeutic Interventions:   Patient seen for skilled ST session with wife present for aphasia and apraxia treatment. Patient able to produce bilabial phonemes (without voicing) with modA cues to take breath in and release breath during lip rounding. Patient able to produce /h/, /p/ and achieved lingual placement for /t/ and mild amount of coordination of airflow for /n/ voiceless. He did produce a mild amount of voicing when imitating lip movements to mouth word "two"and was able to achieve one other time. Patient wrote word to finish phrase 'I eat_____' with "meat". Patient demonstrating progress with imitating to mouth words. He continues to benefit from skilled SLP intervention to maximize cognitive-linguistic and swallow function prior to discharge.  Pain Pain Assessment Pain Scale: 0-10 Pain Score: 0-No pain  Therapy/Group: Individual Therapy  Angela Nevin, MA, CCC-SLP Speech Therapy

## 2020-08-21 NOTE — Progress Notes (Signed)
Physical Therapy Session Note  Patient Details  Name: Justin Mason MRN: [id number] Date of Birth: 05-31-1981  Today's Date: 08/21/2020 PT Individual Time: 5456-2563 PT Individual Time Calculation (min): 58 min   Today's Date: 08/21/2020 PT Missed Time: 17 Minutes Missed Time Reason: Xray  Short Term Goals: Week 1:  PT Short Term Goal 1 (Week 1): Pt will perform bed mobility with mod assist PT Short Term Goal 1 - Progress (Week 1): Progressing toward goal PT Short Term Goal 2 (Week 1): Pt will trasnfer to and from [facility] with max assist of 1 without stedy PT Short Term Goal 2 - Progress (Week 1): Met PT Short Term Goal 3 (Week 1): Pt will remain in WC >2 hours between therapies PT Short Term Goal 3 - Progress (Week 1): Met Week 2:  PT Short Term Goal 1 (Week 2): Pt will perform bed mobility with mod assist PT Short Term Goal 2 (Week 2): Pt will transfer bed<>WC with LRAD and min A of 1 PT Short Term Goal 3 (Week 2): pt will initiate gait training  Skilled Therapeutic Interventions/Progress Updates:   Received pt supine in bed with girlfriend Justin Mason present at bedside, pt agreeable to therapy, and c/o pain with elbow flexion and wrist extension. Pt continues to communicate through head nods and thumbs up/down gestures. Session with emphasis on functional mobility/transfers, generalized strengthening, dynamic standing balance/coordination, NMR, motor control, and improved activity tolerance. Justin Mason reported continued concerns with pt's RUE pain and nodule around elbow. CSW present to deliver information and notified MD of pt's RUE pain and MD present to assess RUE; ordered x-ray. Donned R elbow sleeve with total A and pt transferred supine<>sitting EOB with HOB elevated and mod A for RUE/LE management. Pt transferred bed<>WC via slideboard to L with mod A of 1 and +2 for stabilizing equipment. Pt continues to require cues for anterior weight shifting, hand placement, head/hips relationship, and  sequencing when scooting during all SB transfers. Pt performed x10 seated RLE hamstring curls (AAROM with towel) with pt demonstrating improved hamstring activation. Pt transported to dayroom in [facility] total A for time management purposes and transferred WC<>mat to R side with max A of 1 but +2 providing close suerpvision and stabilizing equipment. Pt transferred sit<>stand from extremely elevated mat with Justin Mason walker x 4 trials with max/total A +2 using mirror for visual feedback. Emphasis on weight bearing through RUE/LE, upright posture and trunk extension, R knee extension, and equal weight distribution. Pt with increased difficulty weight shifting to R side and required total A for trunk/hip extension as pt with tendency for trunk flexion. Pt able to remain standing ~45 seconds maximum prior to sitting to rest. Radiology present to take pt to x-ray and pt performed x2 additional slideboard transfers from mat<>WC to L and from WC<>bed to R in same manner mentioned above. Pt transferred sit<>supine with max A of 1 for RUE/LE management and scooted to [facility] with max A of 1 to manage RUE and second person assisting pt with achieving hooklying position to bridge and scoot. Concluded session with pt supine in bed, needs within reach, and bed alarm on. Radiology present to take pt to x-ray. 17 minutes missed of skilled physical therapy.   Therapy Documentation Precautions:  Precautions Precautions: Fall Precaution Comments: Significant Right sided hemi; intermittent right sided lean Restrictions Weight Bearing Restrictions: No   Therapy/Group: Individual Therapy Justin Mason PT, DPT   08/21/2020, 7:34 AM

## 2020-08-21 NOTE — Plan of Care (Signed)
  Problem: RH BLADDER ELIMINATION Goal: RH STG MANAGE BLADDER WITH ASSISTANCE Description: STG Manage Bladder With min Assistance Outcome: Not Progressing; incontinence Problem: RH SKIN INTEGRITY Goal: RH STG MAINTAIN SKIN INTEGRITY WITH ASSISTANCE Description: STG Maintain Skin Integrity With min Assistance. Outcome: Progressing  Improving MASD on his bottom.

## 2020-08-21 NOTE — Progress Notes (Signed)
Patient ID: Justin Mason, male   DOB: August 11, 1980, 40 y.o.   MRN: [id number] Met with pt and hannah-girlfriend and spoke with Mom via telephone to give them the team update. Justin Mason feels it is Valentines Day that brought on the depressed mood and emotions. She and pt feel he will get back on track. Pt is making progress this week but not as much as last week. Discharge date set for 3/4. So they have something to work toward. Justin Mason is a strong support for pt and he seems to smile when she is around. Mom reports they are building the ramp today for him. Will continue to work on discharge plans and can have neuro-psych see if team feels would be beneficial.

## 2020-08-22 DIAGNOSIS — L89320 Pressure ulcer of left buttock, unstageable: Secondary | ICD-10-CM

## 2020-08-22 LAB — GLUCOSE, CAPILLARY
Glucose-Capillary: 167 mg/dL — ABNORMAL HIGH (ref 70–99)
Glucose-Capillary: 144 mg/dL — ABNORMAL HIGH (ref 70–99)
Glucose-Capillary: 103 mg/dL — ABNORMAL HIGH (ref 70–99)
Glucose-Capillary: 149 mg/dL — ABNORMAL HIGH (ref 70–99)

## 2020-08-22 MED ORDER — COLLAGENASE 250 UNIT/GM EX OINT
TOPICAL_OINTMENT | Freq: Every day | CUTANEOUS | Status: DC
  Filled 2020-08-22: qty 30

## 2020-08-22 MED ORDER — PANTOPRAZOLE SODIUM 40 MG PO TBEC
40.0000 mg | DELAYED_RELEASE_TABLET | Freq: Every day | ORAL | Status: DC
  Administered 2020-08-23 – 2020-09-06 (×15): 40 mg via ORAL
  Filled 2020-08-22 (×15): qty 1

## 2020-08-22 NOTE — Progress Notes (Signed)
[place] PHYSICAL MEDICINE & REHABILITATION PROGRESS NOTE  Subjective/Complaints: Patient seen laying in bed this morning, working with therapies.  Discussed x-ray results with patient and therapies.  Discussed also with nursing.  Discussed improving strength with therapies as well.  ROS: limited due to language  Objective: Vital Signs: Blood pressure 135/72, pulse 72, temperature 98.9 F (37.2 C), resp. rate 16, height 6\' 1"  (1.854 m), weight (!) 158.9 kg, SpO2 98 %. DG Forearm Right  Result Date: 08/21/2020 CLINICAL DATA:  Pain in the posterior side of the right forearm. EXAM: RIGHT FOREARM - 2 VIEW COMPARISON:  None. FINDINGS: There is nonspecific soft tissue swelling about the forearm. There is no acute displaced fracture or dislocation. There are subtle cortical erosions involving the proximal ulna. There are degenerative changes of the elbow. There is no radiopaque foreign body. IMPRESSION: 1. Soft tissue swelling without evidence for an acute displaced fracture or dislocation. 2. Subtle cortical erosions of the proximal ulna. If there is clinical concern for osteomyelitis, follow-up with a contrast-enhanced MRI is recommended. 3. Degenerative changes are noted of the elbow. 4. No radiopaque foreign body. Electronically Signed   By: 08/23/2020 M.D.   On: 08/21/2020 16:56   No results for input(s): WBC, HGB, HCT, PLT in the last 72 hours. No results for input(s): NA, K, CL, CO2, GLUCOSE, BUN, CREATININE, CALCIUM in the last 72 hours.  Intake/Output Summary (Last 24 hours) at 08/22/2020 1231 Last data filed at 08/22/2020 0745 Gross per 24 hour  Intake 720 ml  Output --  Net 720 ml     Pressure Injury 08/09/20 Coccyx Mid Stage 2 -  Partial thickness loss of dermis presenting as a shallow open injury with a red, pink wound bed without slough. (Active)  08/09/20 1555  Location: Coccyx  Location Orientation: Mid  Staging: Stage 2 -  Partial thickness loss of dermis presenting  as a shallow open injury with a red, pink wound bed without slough.  Wound Description (Comments):   Present on Admission: Yes     Pressure Injury 08/09/20 Buttocks Left;Right Deep Tissue Pressure Injury - Purple or maroon localized area of discolored intact skin or blood-filled blister due to damage of underlying soft tissue from pressure and/or shear. (Active)  08/09/20 1716  Location: Buttocks  Location Orientation: Left;Right  Staging: Deep Tissue Pressure Injury - Purple or maroon localized area of discolored intact skin or blood-filled blister due to damage of underlying soft tissue from pressure and/or shear.  Wound Description (Comments):   Present on Admission: Yes    Physical Exam: BP 135/72   Pulse 72   Temp 98.9 F (37.2 C)   Resp 16   Ht 6\' 1"  (1.854 m)   Wt (!) 158.9 kg   SpO2 98%   BMI 46.22 kg/m   Constitutional: No distress . Vital signs reviewed. HENT: Normocephalic.  Atraumatic. Eyes: EOMI. No discharge. Cardiovascular: No JVD.  RRR. Respiratory: Normal effort.  No stridor.  Bilateral clear to auscultation. GI: Non-distended.  BS +. Skin: Warm and dry.   Unstageable left intergluteal ulcer Psych: Normal mood.  Normal behavior. Musc: Right upper extremity edema  No tenderness in extremities. Neuro: Alert Expressive aphasia, stable Nonverbal, stable Motor: RUE 0/5 proximal distal, unchanged RLE: 2/5 proximal to distal, improving No increase in tone noted  Assessment/Plan: 1. Functional deficits which require 3+ hours per day of interdisciplinary therapy in a comprehensive inpatient rehab setting.  Physiatrist is providing close team supervision and 24 hour management of  active medical problems listed below.  Physiatrist and rehab team continue to assess barriers to discharge/monitor patient progress toward functional and medical goals   Care Tool:  Bathing    Body parts bathed by patient: Right arm,Chest,Abdomen,Left upper leg,Face,Right upper  leg,Right lower leg,Left lower leg,Front perineal area   Body parts bathed by helper: Buttocks,Left arm     Bathing assist Assist Level: Moderate Assistance - Patient 50 - 74%     Upper Body Dressing/Undressing Upper body dressing   What is the patient wearing?: Pull over shirt    Upper body assist Assist Level: Moderate Assistance - Patient 50 - 74%    Lower Body Dressing/Undressing Lower body dressing      What is the patient wearing?: Pants     Lower body assist Assist for lower body dressing: Moderate Assistance - Patient 50 - 74%     Toileting Toileting    Toileting assist Assist for toileting: 2 Helpers Assistive Device Comment: Bedrails   Transfers Chair/bed transfer  Transfers assist  Chair/bed transfer activity did not occur: Safety/medical concerns  Chair/bed transfer assist level: 2 Helpers     Locomotion Ambulation   Ambulation assist   Ambulation activity did not occur: Safety/medical concerns          Walk 10 feet activity   Assist  Walk 10 feet activity did not occur: Safety/medical concerns        Walk 50 feet activity   Assist Walk 50 feet with 2 turns activity did not occur: Safety/medical concerns         Walk 150 feet activity   Assist Walk 150 feet activity did not occur: Safety/medical concerns         Walk 10 feet on uneven surface  activity   Assist Walk 10 feet on uneven surfaces activity did not occur: Safety/medical concerns         Wheelchair     Assist Will patient use wheelchair at discharge?: Yes Type of Wheelchair: Manual    Wheelchair assist level: Minimal Assistance - Patient > 75% Max wheelchair distance: 100    Wheelchair 50 feet with 2 turns activity    Assist        Assist Level: Minimal Assistance - Patient > 75%   Wheelchair 150 feet activity     Assist      Assist Level: Moderate Assistance - Patient 50 - 74%   Medical Problem List and Plan: 1.  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  Continue CIR  WHO/PRAFO nightly 2.  Antithrombotics: -DVT/anticoagulation:  Pharmaceutical: Lovenox  Dopplers negative for DVT             -antiplatelet therapy: DAPT X 3 months followed by ASA alone (changed to 81)--Plavix added on 08/09/20 3. Pain Management: Mobic DC'd  Controlled on 2/17             Monitor with increased exertion 4. Mood: LCSW to follow for evaluation and support.              -antipsychotic agents: N/A 5. Neuropsych: This patient is not fully capable of making decisions on his own behalf. 6. Skin/Wound Care: Routine pressure relief measures.   Santyl to intergluteal ulcer 7. Fluids/Electrolytes/Nutrition: Monitor I/Os.  8. HTN: Monitor BP tid--continue Catapres twice daily, Lopressor bid.   HCTZ daily, DC'd on 2/7  Avapro decreased to 150 on 2/8  Controlled  on 2/17             Monitor with increased mobility 9. T2DM with hyperglycemia: Hgb A1c-6.2.   CBG (last 3)  Recent Labs    08/21/20 2142 08/22/20 0459 08/22/20 1114  GLUCAP 147* 167* 144*      Metformin 1000 mg bid  Farxiga restarted  Amaryl DC'd  Slightly elevated, but?  Improving on 2/17 10. Covid 19--Positive  on 07/24/20-->no treatment as asymptomatic. Continue zinc and vitamin C.  11. Morbid obesity: BMI 46. Educate on appropriate diet and exercise to help promote health and mobility.              Encourage weight loss 12.  Hyperlipidemia: Crestor 13. H/o gout: continue allopurinol.  14. Hyponatremia  Na+ 133 on 2/14  Continue to monitor 15. AKI:   Cr.  0.83 on 2/14, labs ordered for tomorrow  Received IVF x3 nights ended on 2/11, improved afterward  Echo reviewed-EF 60-65%  Encourage fluids, reminded 16. Elevated liver enzymes: Resolved  ALT within normal limits on 2/11 17. Mild leukocytosis: Resolved  WBCs 8.2 on  2/14  Encourage IS as possible, OOB  See #18 18.  Acute lower UTI  UA +, urine culture showing E. coli  Completed course of Macrobid  19.  Right elbow pain  Discussed with therapies, reviewed images personally reviewed and reviewed/discussed with radiology, will proceed with MRI  LOS: 13 days A FACE TO FACE EVALUATION WAS PERFORMED  Aerion Bagdasarian Karis Juba 08/22/2020, 12:31 PM

## 2020-08-22 NOTE — Progress Notes (Signed)
Occupational Therapy Session Note  Patient Details  Name: Justin Mason MRN: [id number] Date of Birth: 03-15-1981  Today's Date: 08/22/2020 OT Individual Time: 1330-1430 OT Individual Time Calculation (min): 60 min    Short Term Goals: Week 2:  OT Short Term Goal 1 (Week 2): Pt will don pants with mod assist using AE as needed OT Short Term Goal 2 (Week 2): Pt will complete toilet transfer with mod assist +2 using LRAD. OT Short Term Goal 3 (Week 2): Pt will complete sit > stand with UE support with mod assist of one caregiver to progress towards LB dressing at sit > stand level  Skilled Therapeutic Interventions/Progress Updates:    Patient seated in recliner, wife present at start of session.  Patient continues to be non-verbal but able to indicate wants/needs with pointing/gestures and thumbs up/down accurate responses.  Affect is appropriate and he follows directions without difficulty.  Patient incontinent of large amount of urine while sitting in recliner - utilized stedy to stand from recliner and complete change of clothes and hygiene.  He required assist of 2 to stand from low surface of recliner.  Dependent for pants down and washing buttocks, he was able to wash groin, dependent for donning clean brief and max a for shorts, dependent to pull over hips - standing from stedy surface CGA, from w/c mod A of one.  Recliner cleaned.  Reviewed positioning with wife to optimize shoulder position and pain relief.  Note sore spot distal to elbow - no bruising noted.  Completed NMRE with focus on posture, scapular mobility/stability, shoulder activation and able to facilitate elbow flexion and extension.  PROM distal.  Returned to bed via stedy - mod A to stand from stedy, sit to supine mod A, call bell and tray table in reach.    Therapy Documentation Precautions:  Precautions Precautions: Fall Precaution Comments: Significant Right sided hemi; intermittent right sided  lean Restrictions Weight Bearing Restrictions: No   Therapy/Group: Individual Therapy  Barrie Lyme 08/22/2020, 7:46 AM

## 2020-08-22 NOTE — Progress Notes (Signed)
Patient ID: Justin Mason, male   DOB: June 08, 1981, 40 y.o.   MRN: [id number] Follow up with the patient and significant other regarding questions/concerns. Noted concern re rrt arm pain, MD noted no fracture, xray noted soft tissue edema. Friend asking if anything else to be done for treatment. Discussed positioning and avoid pulling on the extremity. Patient was moved to a larger bed to allow room when rolling side to side and not put pressure on arms. No other questions or concerns noted related to seconday stroke risk management. Continue to follow along to discharge to address educational needs. Pamelia Hoit

## 2020-08-22 NOTE — Progress Notes (Signed)
Speech Language Pathology Daily Session Note  Patient Details  Name: Justin Mason MRN: [id number] Date of Birth: 12-09-1980  Today's Date: 08/22/2020 SLP Individual Time: 1000-1100 SLP Individual Time Calculation (min): 60 min  Short Term Goals: Week 2: SLP Short Term Goal 1 (Week 2): Patient will imitate to mouth bilabial and lingual speech motor movements at CV (consonant-vowel) and CVCV word level with modA. SLP Short Term Goal 2 (Week 2): Patient will utilize word/picture communication boards to request and respond to wants/needs questions with setup and supervision A. SLP Short Term Goal 3 (Week 2): Patient will write 3-letter words to name objects/pictures with modA cues. SLP Short Term Goal 4 (Week 2): Patient will imitate to produce voiceless phonemes /h/ in initial position of CV (consonant vowel) words with maxA.  Skilled Therapeutic Interventions:   Patient seen with significant other present for skilled ST session focusing on expressive and receptive language. Patient able to point to name of picture with field of 3 choices with 100% accuracy. He pointed to word with similar meaning in field of 3 with 85% accuracy. He pointed to pictures in field of 3 that corresponded with sentence (read by patient) and was 75% accurate. He coordinated breath with oral motor movements for bilabials and linguals (/p, t, n, l,/) with modA cues and was able to return demonstrate to produce approximated /k/ and /h/ at phoneme level. Patient continues to benefit from skilled SLP intervention prior to discharge to maximize speech-language abilities.  Pain Pain Assessment Pain Scale: 0-10 Pain Score: 0-No pain  Therapy/Group: Individual Therapy  Angela Nevin, MA, CCC-SLP Speech Therapy

## 2020-08-22 NOTE — Progress Notes (Signed)
Patient would not fit into MRI scanner due to body habitus.  Attempted to place forearm across chest with opposite arm above head and patient still would not fit.

## 2020-08-22 NOTE — Progress Notes (Signed)
Physical Therapy Session Note  Patient Details  Name: Justin Mason MRN: [id number] Date of Birth: 01-Jun-1981  Today's Date: 08/22/2020 PT Individual Time: 0800-0853 PT Individual Time Calculation (min): 53 min   Short Term Goals: Week 1:  PT Short Term Goal 1 (Week 1): Pt will perform bed mobility with mod assist PT Short Term Goal 1 - Progress (Week 1): Progressing toward goal PT Short Term Goal 2 (Week 1): Pt will trasnfer to and from [facility] with max assist of 1 without stedy PT Short Term Goal 2 - Progress (Week 1): Met PT Short Term Goal 3 (Week 1): Pt will remain in WC >2 hours between therapies PT Short Term Goal 3 - Progress (Week 1): Met Week 2:  PT Short Term Goal 1 (Week 2): Pt will perform bed mobility with mod assist PT Short Term Goal 2 (Week 2): Pt will transfer bed<>WC with LRAD and min A of 1 PT Short Term Goal 3 (Week 2): pt will initiate gait training  Skilled Therapeutic Interventions/Progress Updates:   Received pt supine in bed undressed, pt remains non-verbal and communicates through thumbs up/down gestures and head nods. Pt with mild face grimacing when therapist moved RUE into elbow flexion and wrist extension but did not initially c/o pain. Session with emphasis on dressing, functional mobility/transfers, generalized strengthening, dynamic sitting balance/coordination, NMR, motor control, and improved activity tolerance. Doffed R PRAFO with total A and pt found to be completely soiled. Rolled L and R with max A with +2 to maintain sidelying position while protecting hemi arm. Doffed soiled brief, performed peri-care, and donned clean brief with mod/max A +2. Pt able to bridge slightly but unable to clear buttocks from bed on R side. Donned shorts in supine with total A on RLE and mod A on LLE. Rolled L and R again with max A and required total A +2 to pull pants over hips. Donned bilateral ted hose and shoes with total A and pt transferred supine<>sitting EOB from flat bed  with max A. MD present for morning rounds to inspect sacral wound and reported x-ray results of R elbow showed non-specific abnormalities; plans to order MRI today. Pt transferred bed<>WC via slideboard with mod A +2 to L side with multiple small scoots. Pt required total A to place board and max cues for anterior weight shifting, head/hips relationship, and scooting technique as pt with continued difficulty with sequencing and motor planning. Pt sat in WC at sink and washed upper body and face with LUE and supervision but required total A from therapist to wash remainder of back (on R side and underneath R armpit). Donned clean gown with total A to thread RUE through and pt sat in [facility] and brushed teeth at sink with supervision using LUE. Pt performed x10 RLE active assisted towel heel slides with emphasis on hamstring activation. Pt able to achieve ~70 degrees knee flexion, but with decreased ROM with onset of fatigue. Pt sweating heavily after performing heels slides and when therapist asked if it took a lot of focus and concentration pt nodded head "yes"; provided pt with sweat towel. Concluded session with pt sitting in WC, needs within reach, and seatbelt alarm on.   Therapy Documentation Precautions:  Precautions Precautions: Fall Precaution Comments: Significant Right sided hemi; intermittent right sided lean Restrictions Weight Bearing Restrictions: No  Therapy/Group: Individual Therapy Perley Arthurs PT, DPT   08/22/2020, 7:15 AM

## 2020-08-23 DIAGNOSIS — D62 Acute posthemorrhagic anemia: Secondary | ICD-10-CM

## 2020-08-23 DIAGNOSIS — M25521 Pain in right elbow: Secondary | ICD-10-CM

## 2020-08-23 LAB — BASIC METABOLIC PANEL
Anion gap: 11 (ref 5–15)
BUN: 13 mg/dL (ref 6–20)
CO2: 24 mmol/L (ref 22–32)
Calcium: 8.9 mg/dL (ref 8.9–10.3)
Chloride: 99 mmol/L (ref 98–111)
Creatinine, Ser: 0.88 mg/dL (ref 0.61–1.24)
GFR, Estimated: >60 mL/min (ref >60)
Glucose, Bld: 148 mg/dL — ABNORMAL HIGH (ref 70–99)
Potassium: 3.7 mmol/L (ref 3.5–5.1)
Sodium: 134 mmol/L — ABNORMAL LOW (ref 135–145)

## 2020-08-23 LAB — GLUCOSE, CAPILLARY
Glucose-Capillary: 145 mg/dL — ABNORMAL HIGH (ref 70–99)
Glucose-Capillary: 107 mg/dL — ABNORMAL HIGH (ref 70–99)
Glucose-Capillary: 108 mg/dL — ABNORMAL HIGH (ref 70–99)
Glucose-Capillary: 136 mg/dL — ABNORMAL HIGH (ref 70–99)

## 2020-08-23 NOTE — Progress Notes (Signed)
Speech Language Pathology Weekly Progress and Session Note  Patient Details  Name: Justin Mason MRN: [id number] Date of Birth: [birth year]  Beginning of progress report period: 08/16/2020 End of progress report period: 08/23/2020  Today's Date: 08/23/2020 SLP Individual Time: 1300-1400 SLP Individual Time Calculation (min): 60 min  Short Term Goals: Week 2: SLP Short Term Goal 1 (Week 2): Patient will imitate to mouth bilabial and lingual speech motor movements at CV (consonant-vowel) and CVCV word level with modA. SLP Short Term Goal 1 - Progress (Week 2): Met SLP Short Term Goal 2 (Week 2): Patient will utilize word/picture communication boards to request and respond to wants/needs questions with setup and supervision A. SLP Short Term Goal 2 - Progress (Week 2): Progressing toward goal SLP Short Term Goal 3 (Week 2): Patient will write 3-letter words to name objects/pictures with modA cues. SLP Short Term Goal 3 - Progress (Week 2): Progressing toward goal SLP Short Term Goal 4 (Week 2): Patient will imitate to produce voiceless phonemes /h/ in initial position of CV (consonant vowel) words with maxA. SLP Short Term Goal 4 - Progress (Week 2): Met    New Short Term Goals: Week 3: SLP Short Term Goal 1 (Week 3): Patient will coordinate breath support during production of voiceless phonemes /h/, /p/, etc. with modA cues. SLP Short Term Goal 2 (Week 3): Patient will make requests using communication board/AAC device with minA after setup A. SLP Short Term Goal 3 (Week 3): Patient will produce CVCV (consonant-vowel-consonant-vowel) words (reduplicated: baba, papa, etc) with modA cues . SLP Short Term Goal 4 (Week 3): Patient will imitate to mouth one-syllable words with maxA. SLP Short Term Goal 5 (Week 3): Patient will manage right sided buccal pocketing of regular solids with intermittentA cues  Weekly Progress Updates:   Patient has made good progress this week and met 3/5 STG's and is  making progress towards the two goals he did not meet. He has demonstrate improved oral motor movements, has improved with coordinating breath support when mouthing and imitating voiceless phonemes. He is able to point to single words and object pictures to name and request, use number board to point to 3 then 9 for his age '39', point to pictures that match with actions described at sentence level "Point to the woman who is holding a book". SLP has initiated trial of AAC device with patient.  Intensity: Minumum of 1-2 x/day, 30 to 90 minutes Frequency: 3 to 5 out of 7 days Duration/Length of Stay: 3/8 Treatment/Interventions: Cognitive remediation/compensation;Multimodal communication approach;Functional tasks;Therapeutic Activities;Therapeutic Exercise;Oral motor exercises;Dysphagia/aspiration precaution training;Patient/family education   Daily Session  Skilled Therapeutic Interventions: Patient achieved minimal amount of very low vocal intensity voicing 2-3 times during session. He required modA cues for lip rounding and blowing air out and has difficulty with breath support and coordination of airflow during voiceless phoneme production exercises. he was able to demonstrate adequate pointing and use of basic level communication board setup on AAC device.     General    Pain Pain Assessment Pain Scale: 0-10 Pain Score: 0-No pain  Therapy/Group: Individual Therapy  Sonia Baller, MA, CCC-SLP Speech Therapy

## 2020-08-23 NOTE — Plan of Care (Signed)
  Problem: Consults Goal: RH STROKE PATIENT EDUCATION Description: See Patient Education module for education specifics  Outcome: Progressing   Problem: RH BLADDER ELIMINATION Goal: RH STG MANAGE BLADDER WITH ASSISTANCE Description: STG Manage Bladder With min Assistance Outcome: Progressing   Problem: RH PAIN MANAGEMENT Goal: RH STG PAIN MANAGED AT OR BELOW PT'S PAIN GOAL Description: Pain level less than 4 on scale of 0-10 Outcome: Progressing   Problem: RH SKIN INTEGRITY Goal: RH STG MAINTAIN SKIN INTEGRITY WITH ASSISTANCE Description: STG Maintain Skin Integrity With min Assistance. Outcome: Progressing   

## 2020-08-23 NOTE — Progress Notes (Addendum)
[place] PHYSICAL MEDICINE & REHABILITATION PROGRESS NOTE  Subjective/Complaints: Patient seen laying in bed this morning.  He indicates he slept well overnight.  MRI unable to be performed.  He appears to indicate improvement in pain.  ROS: limited due to language  Objective: Vital Signs: Blood pressure (!) 122/56, pulse 75, temperature 98.5 F (36.9 C), temperature source Oral, resp. rate 14, height 6\' 1"  (1.854 m), weight (!) 158.9 kg, SpO2 99 %. DG Forearm Right  Result Date: 08/21/2020 CLINICAL DATA:  Pain in the posterior side of the right forearm. EXAM: RIGHT FOREARM - 2 VIEW COMPARISON:  None. FINDINGS: There is nonspecific soft tissue swelling about the forearm. There is no acute displaced fracture or dislocation. There are subtle cortical erosions involving the proximal ulna. There are degenerative changes of the elbow. There is no radiopaque foreign body. IMPRESSION: 1. Soft tissue swelling without evidence for an acute displaced fracture or dislocation. 2. Subtle cortical erosions of the proximal ulna. If there is clinical concern for osteomyelitis, follow-up with a contrast-enhanced MRI is recommended. 3. Degenerative changes are noted of the elbow. 4. No radiopaque foreign body. Electronically Signed   By: 08/23/2020 M.D.   On: 08/21/2020 16:56   No results for input(s): WBC, HGB, HCT, PLT in the last 72 hours. Recent Labs    08/23/20 0503  NA 134*  K 3.7  CL 99  CO2 24  GLUCOSE 148*  BUN 13  CREATININE 0.88  CALCIUM 8.9    Intake/Output Summary (Last 24 hours) at 08/23/2020 08/25/2020 Last data filed at 08/22/2020 1855 Gross per 24 hour  Intake 660 ml  Output --  Net 660 ml     Pressure Injury 08/09/20 Buttocks Left;Right Deep Tissue Pressure Injury - Purple or maroon localized area of discolored intact skin or blood-filled blister due to damage of underlying soft tissue from pressure and/or shear. (Active)  08/09/20 1716  Location: Buttocks  Location  Orientation: Left;Right  Staging: Deep Tissue Pressure Injury - Purple or maroon localized area of discolored intact skin or blood-filled blister due to damage of underlying soft tissue from pressure and/or shear.  Wound Description (Comments):   Present on Admission: Yes    Physical Exam: BP (!) 122/56 (BP Location: Left Arm)   Pulse 75   Temp 98.5 F (36.9 C) (Oral)   Resp 14   Ht 6\' 1"  (1.854 m)   Wt (!) 158.9 kg   SpO2 99%   BMI 46.22 kg/m   Constitutional: No distress . Vital signs reviewed. HENT: Normocephalic.  Atraumatic. Eyes: EOMI. No discharge. Cardiovascular: No JVD.  RRR. Respiratory: Normal effort.  No stridor.  Bilateral clear to auscultation. GI: Non-distended.  BS +. Skin: Warm and dry.   Gluteal ulcer Psych: Normal mood.  Normal behavior. Musc: Right upper extremity edema, stable No tenderness in extremities, including proximal right forearm. Neuro: Alert Expressive aphasia, unchanged Nonverbal, stable Motor: RUE 0/5 proximal distal, unchanged RLE: 2/5 proximal to distal, improving  Assessment/Plan: 1. Functional deficits which require 3+ hours per day of interdisciplinary therapy in a comprehensive inpatient rehab setting.  Physiatrist is providing close team supervision and 24 hour management of active medical problems listed below.  Physiatrist and rehab team continue to assess barriers to discharge/monitor patient progress toward functional and medical goals   Care Tool:  Bathing    Body parts bathed by patient: Right arm,Chest,Abdomen,Left upper leg,Face,Right upper leg,Right lower leg,Left lower leg,Front perineal area   Body parts bathed by helper: Buttocks,Left arm  Bathing assist Assist Level: Moderate Assistance - Patient 50 - 74%     Upper Body Dressing/Undressing Upper body dressing   What is the patient wearing?: Pull over shirt    Upper body assist Assist Level: Moderate Assistance - Patient 50 - 74%    Lower Body  Dressing/Undressing Lower body dressing      What is the patient wearing?: Pants     Lower body assist Assist for lower body dressing: Moderate Assistance - Patient 50 - 74%     Toileting Toileting    Toileting assist Assist for toileting: 2 Helpers Assistive Device Comment: Bedrails   Transfers Chair/bed transfer  Transfers assist  Chair/bed transfer activity did not occur: Safety/medical concerns  Chair/bed transfer assist level: 2 Helpers     Locomotion Ambulation   Ambulation assist   Ambulation activity did not occur: Safety/medical concerns          Walk 10 feet activity   Assist  Walk 10 feet activity did not occur: Safety/medical concerns        Walk 50 feet activity   Assist Walk 50 feet with 2 turns activity did not occur: Safety/medical concerns         Walk 150 feet activity   Assist Walk 150 feet activity did not occur: Safety/medical concerns         Walk 10 feet on uneven surface  activity   Assist Walk 10 feet on uneven surfaces activity did not occur: Safety/medical concerns         Wheelchair     Assist Will patient use wheelchair at discharge?: Yes Type of Wheelchair: Manual    Wheelchair assist level: Minimal Assistance - Patient > 75% Max wheelchair distance: 100    Wheelchair 50 feet with 2 turns activity    Assist        Assist Level: Minimal Assistance - Patient > 75%   Wheelchair 150 feet activity     Assist      Assist Level: Moderate Assistance - Patient 50 - 74%   Medical Problem List and Plan: 1.  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  Continue CIR  WHO/PRAFO nightly 2.  Antithrombotics: -DVT/anticoagulation:  Pharmaceutical: Lovenox  Dopplers negative for DVT             -antiplatelet therapy: DAPT X 3 months followed by ASA alone (changed to  81)--Plavix added on 08/09/20 3. Pain Management: Mobic DC'd  Controlled on 2/18             Monitor with increased exertion 4. Mood: LCSW to follow for evaluation and support.              -antipsychotic agents: N/A 5. Neuropsych: This patient is not fully capable of making decisions on his own behalf. 6. Skin/Wound Care: Routine pressure relief measures.   Santyl to intergluteal ulcer 7. Fluids/Electrolytes/Nutrition: Monitor I/Os.  8. HTN: Monitor BP tid--continue Catapres twice daily, Lopressor bid.   HCTZ daily, DC'd on 2/7  Avapro decreased to 150 on 2/8  Controlled on 2/18             Monitor with increased mobility 9. T2DM with hyperglycemia: Hgb A1c-6.2.   CBG (last 3)  Recent Labs    08/22/20 1647 08/22/20 2237 08/23/20 0516  GLUCAP 103* 149* 145*      Metformin 1000 mg bid  Farxiga restarted  Amaryl DC'd  Slightly elevated on 2/18, monitor trend given recent changes in medication 10. Covid 19--Positive  on 07/24/20-->no treatment as asymptomatic. Continue zinc and vitamin C.  11. Morbid obesity: BMI 46. Educate on appropriate diet and exercise to help promote health and mobility.              Encourage weight loss 12.  Hyperlipidemia: Crestor 13. H/o gout: continue allopurinol.  14. Hyponatremia  Na+ 134 on 2/18  Continue to monitor 15. AKI:   Cr.  0.88 on 2/18  Received IVF x3 nights ended on 2/11, improved afterward  Echo reviewed-EF 60-65%  Encourage fluids, reminded 16. Elevated liver enzymes: Resolved  ALT within normal limits on 2/11 17. Mild leukocytosis: Resolved  WBCs 8.2 on 2/14  Encourage IS as possible, OOB  See #18 18.  Acute lower UTI  UA +, urine culture showing E. coli  Completed course of Macrobid  19.  Right elbow pain  Discussed with therapies, reviewed images personally reviewed and reviewed/discussed with radiology  MRI unable to be performed due to body habitus, CT ordered 20.  Acute blood loss anemia  Hemoglobin 11.6 on 2/14,  labs ordered for Monday  LOS: 14 days A FACE TO FACE EVALUATION WAS PERFORMED  Terran Klinke Karis Juba 08/23/2020, 9:06 AM

## 2020-08-23 NOTE — Progress Notes (Signed)
Physical Therapy Weekly Progress Note  Patient Details  Name: Justin Mason MRN: [id number] Date of Birth: 05/18/1981  Beginning of progress report period: August 10, 2020 End of progress report period: August 23, 2020  Today's Date: 08/23/2020 PT Individual Time: 1100-1157 PT Individual Time Calculation (min): 57 min   Patient has met 1 of 3 short term goals. Pt demonstrates slow progress towards long term goals. Pt currently requires heavy mod A overall for bed mobility, mod A +2 for slideboard transfers overall, however pt has been able to perform slideboard transfers with as little as mod A of 1. Pt also requires +2 assist to transfer sit<>stand using Eva walker and with 3 musketeer assist and min A for WC mobility up to 126f. Pt recently c/o pain in R elbow/wrist and imaging has been ordered.   Patient continues to demonstrate the following deficits muscle weakness, decreased cardiorespiratoy endurance, impaired timing and sequencing, abnormal tone, unbalanced muscle activation, decreased coordination and decreased motor planning, decreased midline orientation and decreased motor planning and decreased sitting balance, decreased standing balance, decreased postural control, hemiplegia and decreased balance strategies and therefore will continue to benefit from skilled PT intervention to increase functional independence with mobility.  Patient progressing toward long term goals..  Continue plan of care.  PT Short Term Goals Week 2:  PT Short Term Goal 1 (Week 2): Pt will perform bed mobility with mod assist PT Short Term Goal 1 - Progress (Week 2): Met PT Short Term Goal 2 (Week 2): Pt will transfer bed<>WC with LRAD and min A of 1 PT Short Term Goal 2 - Progress (Week 2): Progressing toward goal PT Short Term Goal 3 (Week 2): pt will initiate gait training PT Short Term Goal 3 - Progress (Week 2): Progressing toward goal Week 3:  PT Short Term Goal 1 (Week 3): Pt will transfer  bed<>WC with LRAD and min A of 1 PT Short Term Goal 2 (Week 3): pt will initiate gait training PT Short Term Goal 3 (Week 3): Pt will transfer sit<>stand with max of 1 consistantly  Skilled Therapeutic Interventions/Progress Updates:  Ambulation/gait training;Balance/vestibular training;Cognitive remediation/compensation;Community reintegration;Discharge planning;Disease management/prevention;DME/adaptive equipment instruction;Functional electrical stimulation;Functional mobility training;Neuromuscular re-education;Pain management;Patient/family education;Psychosocial support;Skin care/wound management;Splinting/orthotics;Therapeutic Exercise;Stair training;Therapeutic Activities;UE/LE Strength taining/ROM;Visual/perceptual remediation/compensation;UE/LE Coordination activities;Wheelchair propulsion/positioning   Today's Interventions: Received pt semi-reclined in bed with diaphoresis pt agreeable to therapy, and initially did not c/o pain but when therapist moved R elbow and wrist pt demonstrated facial grimacing. BP 134/69 and pt denied any complaints. Session with emphasis on functional mobility/transfers, generalized strengthening, dynamic standing balance/coordination, NMR, motor control/planning, and improved activity tolerance. Donned bilateral ted hose and shoes with total A and checked to ensure pt was clean prior to getting OOB. Pt transferred supine<>sitting EOB with heavy mod A from flat bed and transferred bed<>WC via slideboard with multiple scoots and mod A +2 with total A to place board with second person stabilizing WC. Pt required cues for anterior weight shifting and head/hips relationship when scooting. Pt performed WC mobility 757fx 1 and 2559f 1 using R hemi technique and min A (mod A to navigate obstacles) with increased time. Pt performed 2 additional slideboard transfers to/from mat with mod A +2 to L and R with total A to place board with second person to stabilize board and WC.  Pt transferred sit<>stand x 2 trials using 3 musketeer assist with RUE supported around therapist and therapist blocking R knee buckling using mirror for visual feedback. Emphasis on  upright posture/gaze, R lateral weight shifting, hip and R knee extension. Pt with increased difficulty achieving trunk/hip extension on second standing trial due to fatigue. Pt sweating heavily throughout session and required frequent rest breaks and a sweat towel. Pt transported back to room in [facility] total A and pt agreeable to stay sitting up for lunch. Concluded session with pt sitting in WC, needs within reach, and seatbelt alarm on.   Therapy Documentation Precautions:  Precautions Precautions: Fall Precaution Comments: Significant Right sided hemi; intermittent right sided lean Restrictions Weight Bearing Restrictions: No  Therapy/Group: Individual Therapy Igor Bishop PT, DPT   08/23/2020, 7:46 AM

## 2020-08-23 NOTE — Progress Notes (Signed)
Occupational Therapy Session Note  Patient Details  Name: Justin Mason MRN: [id number] Date of Birth: [birth year]  Today's Date: 08/23/2020 OT Individual Time: 2119-4174 and 1420-1510 OT Individual Time Calculation (min): 27 min and 50 min   Short Term Goals: Week 2:  OT Short Term Goal 1 (Week 2): Pt will don pants with mod assist using AE as needed OT Short Term Goal 2 (Week 2): Pt will complete toilet transfer with mod assist +2 using LRAD. OT Short Term Goal 3 (Week 2): Pt will complete sit > stand with UE support with mod assist of one caregiver to progress towards LB dressing at sit > stand level  Skilled Therapeutic Interventions/Progress Updates:    1) Treatment session with focus on self-care retraining and bed mobility. Pt received semi-reclined in bed with incontinence brief saturated with urine.  Engaged in rolling R and L to clean with +2 assist due to new air mattress.  Therapist noted stool coming out of rectum, therefore placed pt on bedpan - pt with continent BM.  Completed rolling again for hygiene and donned clean incontinence brief.  Engaged in LB dressing with pt threading BLE in figure 4 position, therapist assist to obtain figure 4 position.  Pt utilizing bridging and rolling with +2 to pull pants over hips.  Due to time constraints, pt remained semi-reclined in bed at end of session.  2) Treatment session with focus on RUE NMR, edema management, and functional mobility.  Pt received upright in w/c agreeable to therapy session.  Therapist transported pt to therapy gym in w/c.  Engaged in RUE NMR in sitting at table top with focus on shoulder activation with towel glides. Pt able to elicit shoulder extension and minimal horizontal adduction with facilitation at elbow.  Therapist providing retrograde massage to R hand, digits, and forearm.  Therapist applied kinesiotape to R hand and forearm, cleared by MD, to continue to address edema in hand.  Pt returned to room with plan to  transfer back to bed.  Max Assist +2 sit > stand in [place].  Transferred to EOB via [place] and then back to bed max assist.  Pt incontinent of urine, therefore engaged in hygiene and donning clean brief with rolling R and L and bridging with assist to stabilize BLE.  Pt remained semi-reclined in bed with all needs in reach.  Therapy Documentation Precautions:  Precautions Precautions: Fall Precaution Comments: Significant Right sided hemi; intermittent right sided lean Restrictions Weight Bearing Restrictions: No General:   Vital Signs: Therapy Vitals Temp: 98.2 F (36.8 C) Pulse Rate: 86 Resp: 14 BP: 121/68 Patient Position (if appropriate): Sitting Oxygen Therapy SpO2: 98 % O2 Device: Room Air Pain:  Pt with no c/o pain   Therapy/Group: Individual Therapy  Rosalio Loud 08/23/2020, 3:24 PM

## 2020-08-24 ENCOUNTER — Inpatient Hospital Stay (HOSPITAL_COMMUNITY): Payer: Managed Care, Other (non HMO)

## 2020-08-24 LAB — GLUCOSE, CAPILLARY
Glucose-Capillary: 201 mg/dL — ABNORMAL HIGH (ref 70–99)
Glucose-Capillary: 149 mg/dL — ABNORMAL HIGH (ref 70–99)
Glucose-Capillary: 158 mg/dL — ABNORMAL HIGH (ref 70–99)
Glucose-Capillary: 146 mg/dL — ABNORMAL HIGH (ref 70–99)

## 2020-08-24 IMAGING — CT CT FOREARM*R* W/CM
3 of 5 series · 15 of 34 positions shown, 18 images · IV contrast (omnipaque)
Comparison: Plain films right forearm [DATE].

CLINICAL DATA: Right forearm pain and swelling. Question
osteomyelitis.

EXAM:
CT OF THE UPPER RIGHT EXTREMITY WITH CONTRAST
TECHNIQUE: Multidetector CT imaging of the upper right extremity was performed
according to the standard protocol following intravenous contrast
administration.
CONTRAST:  100 mL OMNIPAQUE IOHEXOL 300 MG/ML  SOLN

[Series 5: 2 1.5 mm st ax · axial · 0.69mm/px · z∈[-570,-418]mm · 9 of 121 slices shown, 12 images]
[im 10/121  soft-tissue]
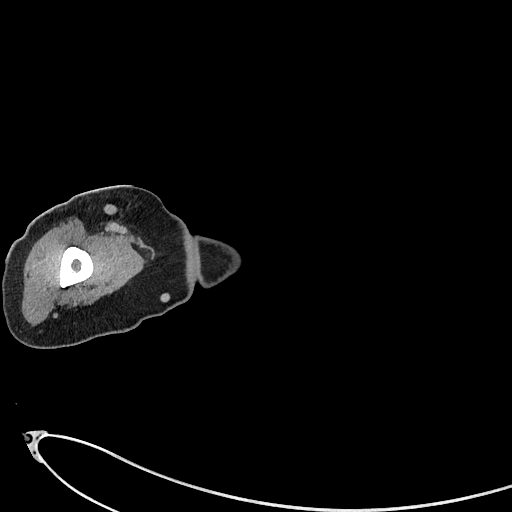
[im 10/121  bone]
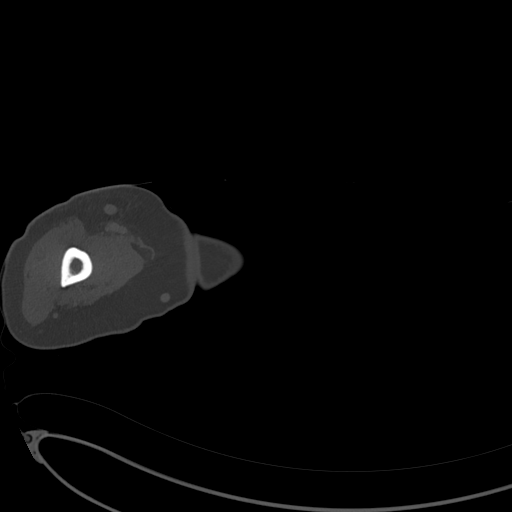
[im 28/121  bone]
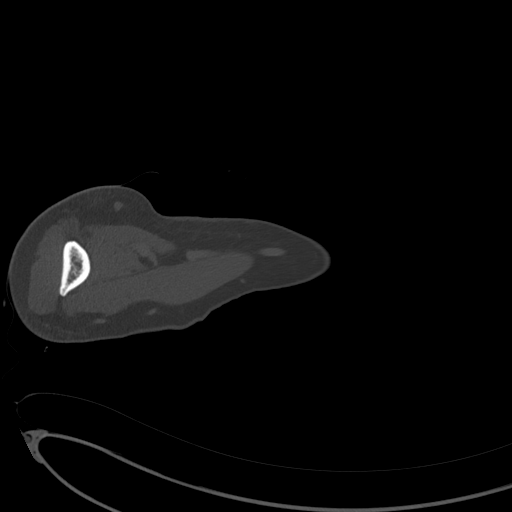
[im 37/121  bone]
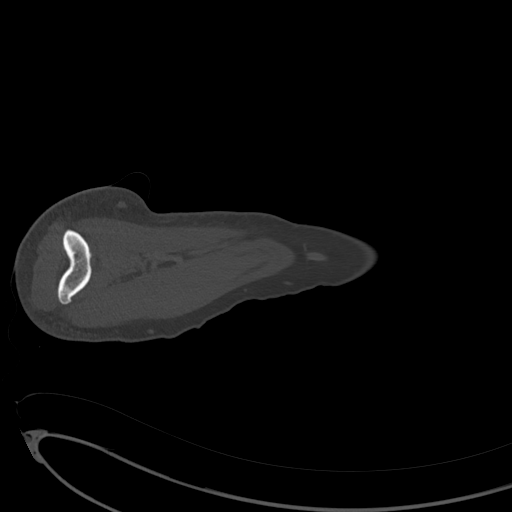
[im 47/121  bone]
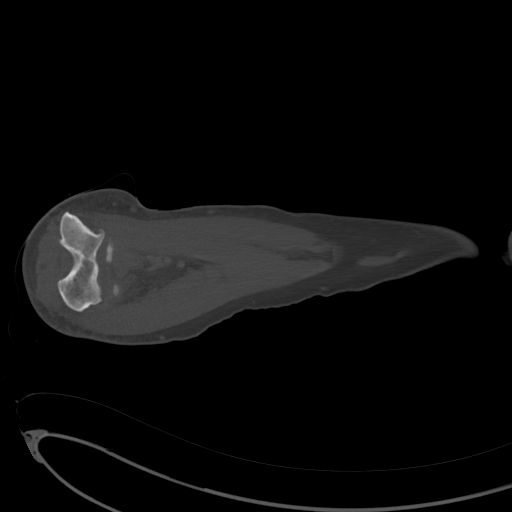
[im 65/121  soft-tissue]
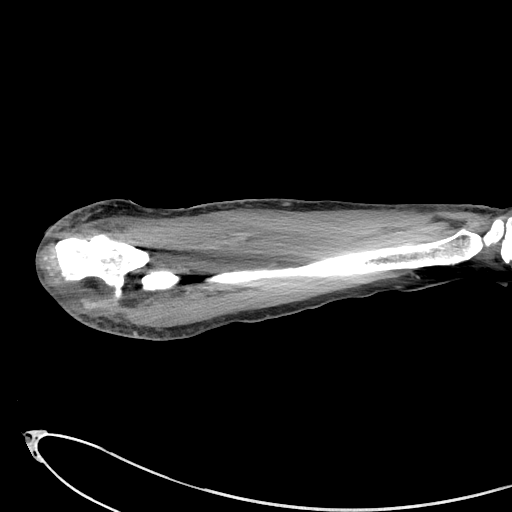
[im 65/121  bone]
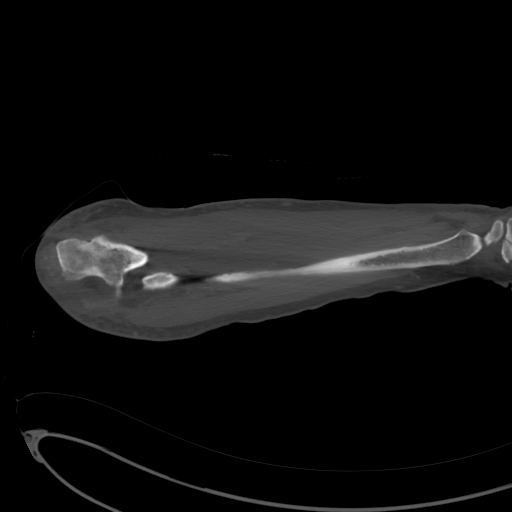
[im 74/121  bone]
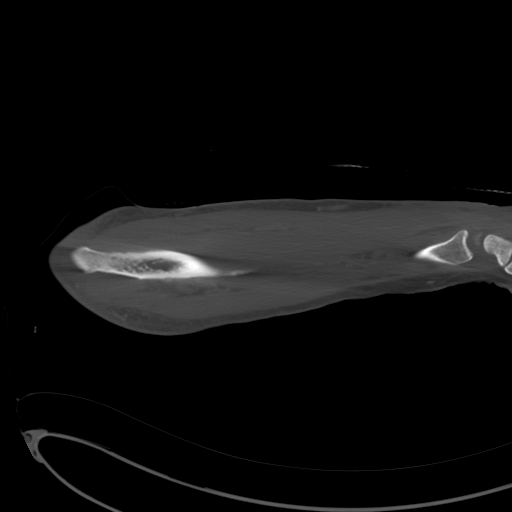
[im 84/121  bone]
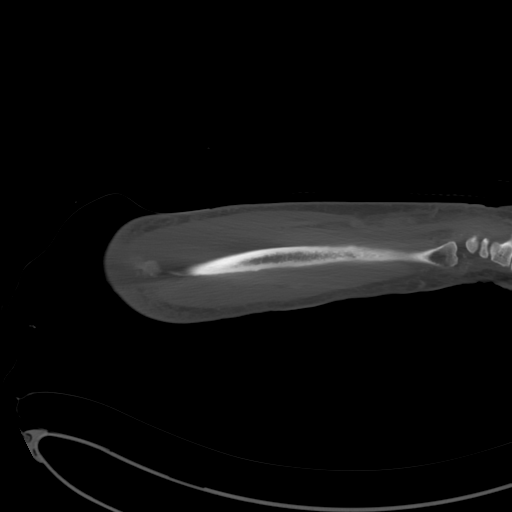
[im 102/121  bone]
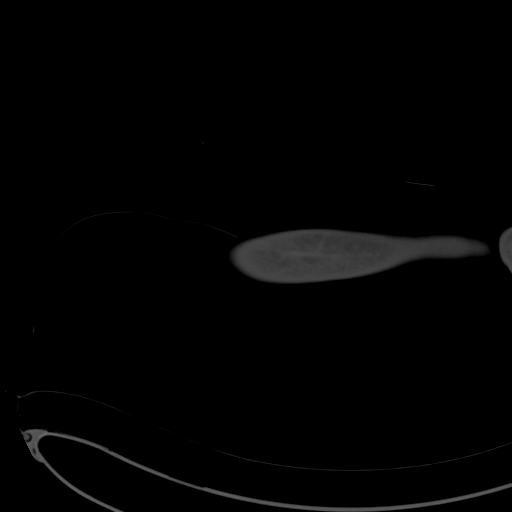
[im 111/121  soft-tissue]
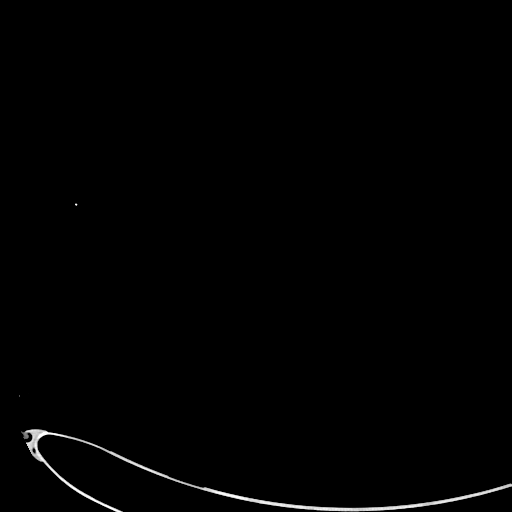
[im 111/121  bone]
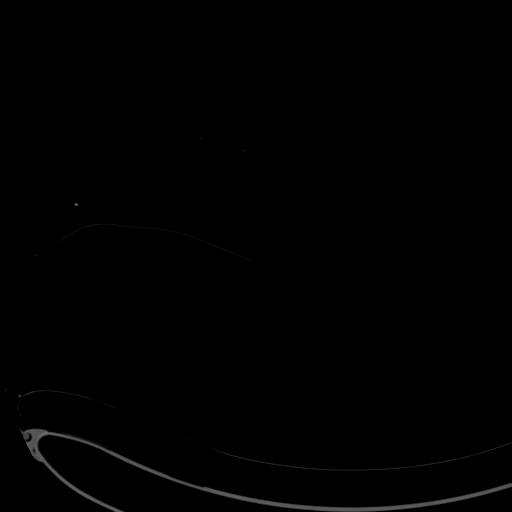

[Series 11: cor 1.5 mm bone · coronal · 0.33mm/px · 1 of 77 slices shown]
[im 39/77  bone]
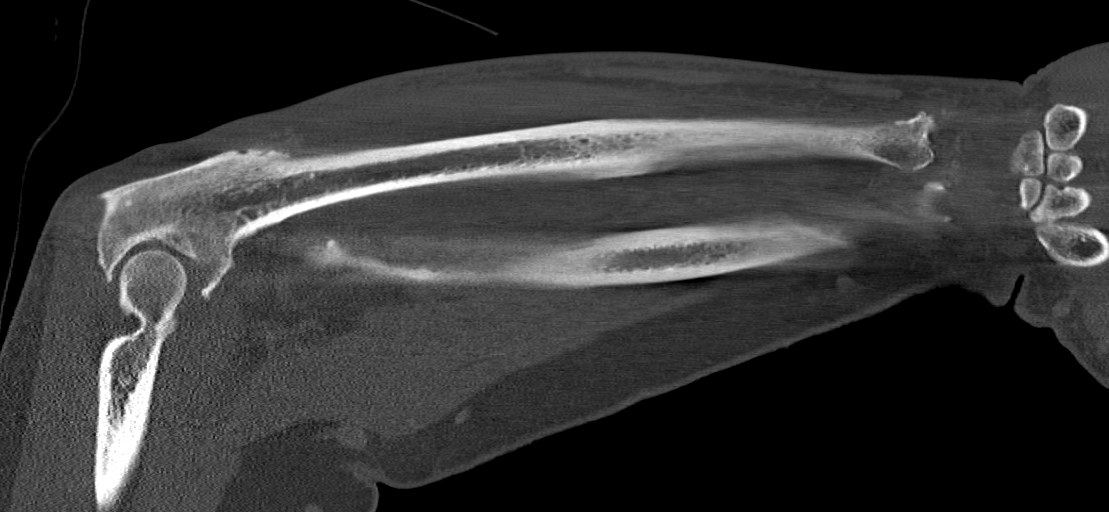

[Series 13: ax 1.5 mm st · sagittal · 0.31mm/px · 5 of 245 slices shown]
[im 49/245  bone]
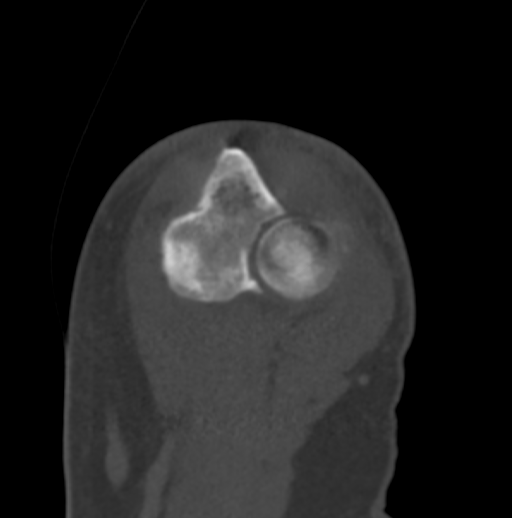
[im 69/245  soft-tissue]
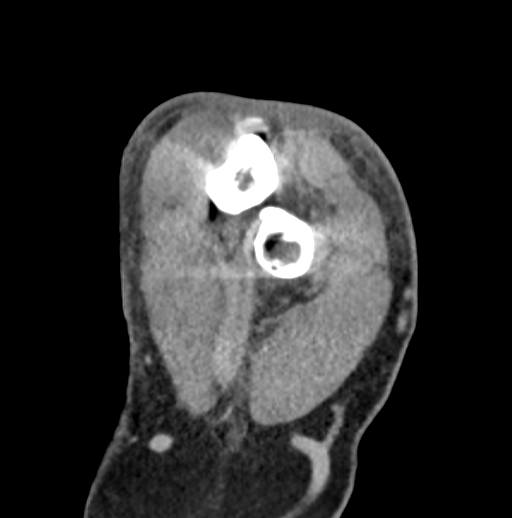
[im 98/245  bone]
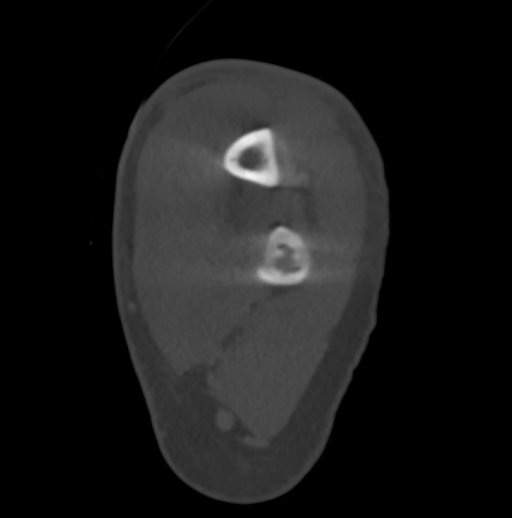
[im 147/245  bone]
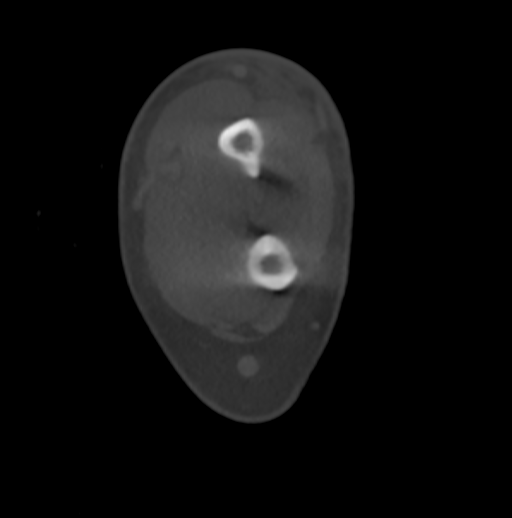
[im 196/245  bone]
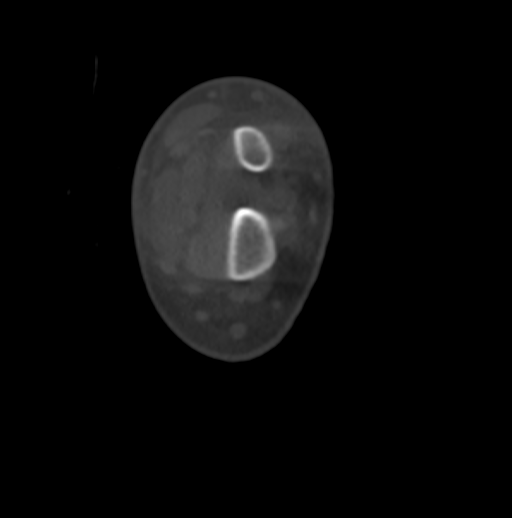

[15 of 34 positions shown; findings below may reference images not displayed]

FINDINGS: Bones/Joint/Cartilage

There is no evidence of osteomyelitis. Mild cortical irregularity of
the dorsal margin of the proximal ulna seen on prior plain films is
in the region of the anconeus tendon insertion and likely due to
chronic, mild tug lesion or tendinosis. Subtle calcifications are
seen in the distal anconeus tendon. The patient has advanced for age
osteoarthritis about the elbow with joint space narrowing and
osteophytosis worst at the radiocapitellar joint. Small spur at the
triceps tendon insertion and a small calcification in the distal
triceps tendon noted. No joint effusion.

Ligaments

Suboptimally assessed by CT.

Muscles and Tendons

Intact. No intramuscular fluid collection. No gas within muscle or
tracking along fascial planes. As described above.

Soft tissues

Head no focal fluid collection or mass. There is some subcutaneous
edema about the dorsal aspect of the forearm.
IMPRESSION: Negative for osteomyelitis. Subtle area of cortical irregularity on
the posterior ulna is likely related to chronic anconeus tendinosis.

Mild stranding in subcutaneous tissues of the forearm may be due to
cellulitis or dependent change.

Negative for abscess, myositis or septic joint.

Advanced for age osteoarthritis about the elbow.

## 2020-08-24 MED ORDER — IOHEXOL 300 MG/ML  SOLN
100.0000 mL | Freq: Once | INTRAMUSCULAR | Status: AC | PRN
  Administered 2020-08-24: 100 mL via INTRAVENOUS

## 2020-08-24 NOTE — Progress Notes (Signed)
Occupational Therapy Session Note  Patient Details  Name: Justin Mason MRN: [id number] Date of Birth: [birth year]  Today's Date: 08/24/2020 OT Individual Time: 1105-1200 OT Individual Time Calculation (min): 55 min    Short Term Goals: Week 2:  OT Short Term Goal 1 (Week 2): Pt will don pants with mod assist using AE as needed OT Short Term Goal 2 (Week 2): Pt will complete toilet transfer with mod assist +2 using LRAD. OT Short Term Goal 3 (Week 2): Pt will complete sit > stand with UE support with mod assist of one caregiver to progress towards LB dressing at sit > stand level  Skilled Therapeutic Interventions/Progress Updates:    Treatment session with focus on self-care retraining and hemi-techniques and use of AE for bathing and dressing.  Pt received semi-reclined in bed, noted incontinence brief to be saturated with urine.  Engaged in rolling R and L in bed with max assist due to air mattress and especially when rolling to pt's L due to decreased assist from RUE/RLE.  Engaged in hygiene in sidelying and donned clean brief.  Pt washed lower legs in figure 4 position with therapist assist to obtain figure 4 with RLE.  Pt then able to don shorts in figure 4 on R and with use of reacher on L.  Pt bridged with mod assist to maintain feet in position while pt able to mostly pull shorts over L hip.  Pt then engaged in rolling in both directions to allow therapist to pull pants fully over hips.  Engaged in UB bathing and dressing seated upright in bed with cues for pt to attempt hemi-bathing and then to recall hemi-dressing techniques.  Pt demonstrating good recall and carryover of previous education with bathing and dressing.  Pt remained upright in bed with RUE elevated on pillow, noted some decrease in swelling in hand since kinesiotape applied yesterday.    Therapy Documentation Precautions:  Precautions Precautions: Fall Precaution Comments: Significant Right sided hemi; intermittent right  sided lean Restrictions Weight Bearing Restrictions: No Pain:  Pt with c/o pain in R shoulder with flexion > 90*.  Guarded during mobility.   Therapy/Group: Individual Therapy  Rosalio Loud 08/24/2020, 12:35 PM

## 2020-08-24 NOTE — Progress Notes (Signed)
Physical Therapy Session Note  Patient Details  Name: Justin Mason MRN: [id number] Date of Birth: 10-Mar-1981  Today's Date: 08/24/2020 PT Missed Time: 45 Minutes Missed Time Reason: CT/MRI  Pt off the unit for CT. Missed 45 minutes of skilled physical therapy.   Ginny Forth , PT, DPT, CSRS  08/24/2020, 3:22 PM

## 2020-08-24 NOTE — Plan of Care (Signed)
  Problem: Consults Goal: RH STROKE PATIENT EDUCATION Description: See Patient Education module for education specifics  Outcome: Progressing   Problem: RH BLADDER ELIMINATION Goal: RH STG MANAGE BLADDER WITH ASSISTANCE Description: STG Manage Bladder With min Assistance Outcome: Progressing   Problem: RH PAIN MANAGEMENT Goal: RH STG PAIN MANAGED AT OR BELOW PT'S PAIN GOAL Description: Pain level less than 4 on scale of 0-10 Outcome: Progressing   Problem: RH SKIN INTEGRITY Goal: RH STG MAINTAIN SKIN INTEGRITY WITH ASSISTANCE Description: STG Maintain Skin Integrity With min Assistance. Outcome: Progressing   

## 2020-08-24 NOTE — Progress Notes (Signed)
[place] PHYSICAL MEDICINE & REHABILITATION PROGRESS NOTE  Subjective/Complaints: No complaints  ROS: limited due to language  Objective: Vital Signs: Blood pressure 140/76, pulse 78, temperature 97.6 F (36.4 C), temperature source Oral, resp. rate 17, height 6\' 1"  (1.854 m), weight (!) 145 kg, SpO2 100 %. No results found. No results for input(s): WBC, HGB, HCT, PLT in the last 72 hours. Recent Labs    08/23/20 0503  NA 134*  K 3.7  CL 99  CO2 24  GLUCOSE 148*  BUN 13  CREATININE 0.88  CALCIUM 8.9    Intake/Output Summary (Last 24 hours) at 08/24/2020 1356 Last data filed at 08/24/2020 1255 Gross per 24 hour  Intake 818 ml  Output --  Net 818 ml     Pressure Injury 08/09/20 Buttocks Left;Right Deep Tissue Pressure Injury - Purple or maroon localized area of discolored intact skin or blood-filled blister due to damage of underlying soft tissue from pressure and/or shear. (Active)  08/09/20 1716  Location: Buttocks  Location Orientation: Left;Right  Staging: Deep Tissue Pressure Injury - Purple or maroon localized area of discolored intact skin or blood-filled blister due to damage of underlying soft tissue from pressure and/or shear.  Wound Description (Comments):   Present on Admission: Yes    Physical Exam: BP 140/76 (BP Location: Left Arm)   Pulse 78   Temp 97.6 F (36.4 C) (Oral)   Resp 17   Ht 6\' 1"  (1.854 m)   Wt (!) 145 kg   SpO2 100%   BMI 42.17 kg/m   Gen: no distress, normal appearing HEENT: oral mucosa pink and moist, NCAT Cardio: Reg rate Chest: normal effort, normal rate of breathing Abd: soft, non-distended Ext: no edema Psych: pleasant, normal affect Skin:  Gluteal ulcer Psych: Normal mood.  Normal behavior. Musc: Right upper extremity edema, stable No tenderness in extremities, including proximal right forearm. Neuro: Alert Expressive aphasia, unchanged Nonverbal, stable Motor: RUE 0/5 proximal distal, unchanged RLE: 2/5 proximal  to distal, improving  Assessment/Plan: 1. Functional deficits which require 3+ hours per day of interdisciplinary therapy in a comprehensive inpatient rehab setting.  Physiatrist is providing close team supervision and 24 hour management of active medical problems listed below.  Physiatrist and rehab team continue to assess barriers to discharge/monitor patient progress toward functional and medical goals   Care Tool:  Bathing    Body parts bathed by patient: Right arm,Chest,Abdomen,Left upper leg,Face,Right upper leg,Right lower leg,Left lower leg,Front perineal area   Body parts bathed by helper: Buttocks,Left arm     Bathing assist Assist Level: Moderate Assistance - Patient 50 - 74%     Upper Body Dressing/Undressing Upper body dressing   What is the patient wearing?: Pull over shirt    Upper body assist Assist Level: Moderate Assistance - Patient 50 - 74%    Lower Body Dressing/Undressing Lower body dressing      What is the patient wearing?: Pants     Lower body assist Assist for lower body dressing: Moderate Assistance - Patient 50 - 74%     Toileting Toileting    Toileting assist Assist for toileting: 2 Helpers Assistive Device Comment: Bedrails   Transfers Chair/bed transfer  Transfers assist  Chair/bed transfer activity did not occur: Safety/medical concerns  Chair/bed transfer assist level: 2 Helpers     Locomotion Ambulation   Ambulation assist   Ambulation activity did not occur: Safety/medical concerns          Walk 10 feet activity  Assist  Walk 10 feet activity did not occur: Safety/medical concerns        Walk 50 feet activity   Assist Walk 50 feet with 2 turns activity did not occur: Safety/medical concerns         Walk 150 feet activity   Assist Walk 150 feet activity did not occur: Safety/medical concerns         Walk 10 feet on uneven surface  activity   Assist Walk 10 feet on uneven surfaces  activity did not occur: Safety/medical concerns         Wheelchair     Assist Will patient use wheelchair at discharge?: Yes Type of Wheelchair: Manual    Wheelchair assist level: Minimal Assistance - Patient > 75% Max wheelchair distance: 100    Wheelchair 50 feet with 2 turns activity    Assist        Assist Level: Minimal Assistance - Patient > 75%   Wheelchair 150 feet activity     Assist      Assist Level: Moderate Assistance - Patient 50 - 74%   Medical Problem List and Plan: 1.  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  Continue CIR  WHO/PRAFO nightly 2.  Antithrombotics: -DVT/anticoagulation:  Pharmaceutical: Continue Lovenox  Dopplers negative for DVT             -antiplatelet therapy: DAPT X 3 months followed by ASA alone (changed to 81)--Plavix added on 08/09/20 3. Pain Management: Mobic DC'd  Controlled on 2/18             Monitor with increased exertion 4. Mood: LCSW to follow for evaluation and support.              -antipsychotic agents: N/A 5. Neuropsych: This patient is not fully capable of making decisions on his own behalf. 6. Skin/Wound Care: Routine pressure relief measures.   Santyl to intergluteal ulcer 7. Fluids/Electrolytes/Nutrition: Monitor I/Os.  8. HTN: Monitor BP tid   HCTZ daily, DC'd on 2/7  Avapro decreased to 150 on 2/8  Controlled on 2/19- continue Catapres twice daily, Lopressor bid.              Monitor with increased mobility 9. T2DM with hyperglycemia: Hgb A1c-6.2.   CBG (last 3)  Recent Labs    08/23/20 2109 08/24/20 0737 08/24/20 1225  GLUCAP 136* 201* 149*      Metformin 1000 mg bid  Farxiga restarted  Amaryl DC'd  Elevated to 200s on 2/19, monitor AC/HS 10. Covid 19--Positive  on 07/24/20-->no treatment as asymptomatic. Continue zinc and vitamin C.  11. Morbid obesity: BMI 46.  Educate on appropriate diet and exercise to help promote health and mobility.              Encourage weight loss 12.  Hyperlipidemia: Crestor 13. H/o gout: continue allopurinol.  14. Hyponatremia  Na+ 134 on 2/18  Continue to monitor 15. AKI:   Cr.  0.88 on 2/18  Received IVF x3 nights ended on 2/11, improved afterward  Echo reviewed-EF 60-65%  Encourage fluids, reminded 16. Elevated liver enzymes: Resolved  ALT within normal limits on 2/11 17. Mild leukocytosis: Resolved  WBCs 8.2 on 2/14  Encourage IS as possible, OOB  See #18 18.  Acute lower UTI  UA +, urine culture showing E. coli  Completed course of Macrobid  19.  Right elbow pain  Discussed with  therapies, reviewed images personally reviewed and reviewed/discussed with radiology  MRI unable to be performed due to body habitus, CT ordered 20.  Acute blood loss anemia  Hemoglobin 11.6 on 2/14, labs ordered for Monday  LOS: 15 days A FACE TO FACE EVALUATION WAS PERFORMED  Clint Bolder P Seeley [place] 08/24/2020, 1:56 PM

## 2020-08-25 ENCOUNTER — Encounter (HOSPITAL_COMMUNITY): Payer: Self-pay | Admitting: Physical Medicine & Rehabilitation

## 2020-08-25 LAB — GLUCOSE, CAPILLARY
Glucose-Capillary: 155 mg/dL — ABNORMAL HIGH (ref 70–99)
Glucose-Capillary: 127 mg/dL — ABNORMAL HIGH (ref 70–99)
Glucose-Capillary: 147 mg/dL — ABNORMAL HIGH (ref 70–99)
Glucose-Capillary: 126 mg/dL — ABNORMAL HIGH (ref 70–99)

## 2020-08-25 NOTE — Progress Notes (Signed)
[place] PHYSICAL MEDICINE & REHABILITATION PROGRESS NOTE  Subjective/Complaints: No complaints CT RUE negative for osteomyelitis, abscess, myositis. Suggesting of anconeus tendinosis Vitas stable  ROS: limited due to language  Objective: Vital Signs: Blood pressure 134/73, pulse 67, temperature 98.4 F (36.9 C), temperature source Oral, resp. rate 16, height 6\' 1"  (1.854 m), weight (!) 145 kg, SpO2 96 %. CT FOREARM RIGHT W CONTRAST  Result Date: 08/24/2020 CLINICAL DATA:  Right forearm pain and swelling. Question osteomyelitis. EXAM: CT OF THE UPPER RIGHT EXTREMITY WITH CONTRAST TECHNIQUE: Multidetector CT imaging of the upper right extremity was performed according to the standard protocol following intravenous contrast administration. CONTRAST:  100 mL OMNIPAQUE IOHEXOL 300 MG/ML  SOLN COMPARISON:  Plain films right forearm 08/21/2020. FINDINGS: Bones/Joint/Cartilage There is no evidence of osteomyelitis. Mild cortical irregularity of the dorsal margin of the proximal ulna seen on prior plain films is in the region of the anconeus tendon insertion and likely due to chronic, mild tug lesion or tendinosis. Subtle calcifications are seen in the distal anconeus tendon. 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 Small spur at the triceps tendon insertion and a small calcification in the distal triceps tendon noted. No joint effusion. Ligaments Suboptimally assessed by CT. Muscles and Tendons Intact. No intramuscular fluid collection. No gas within muscle or tracking along fascial planes. As described above. Soft tissues Head no focal fluid collection or mass. There is some subcutaneous edema about the dorsal aspect of the forearm. IMPRESSION: Negative for osteomyelitis. 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 Mild stranding in  subcutaneous tissues of the forearm may be due to cellulitis or dependent change. Negative for abscess, myositis or septic joint. 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 Electronically Signed   By: 08/23/2020 M.D.   On: 08/24/2020 17:55   No results for input(s): WBC, HGB, HCT, PLT in the last 72 hours. Recent Labs    08/23/20 0503  NA 134*  K 3.7  CL 99  CO2 24  GLUCOSE 148*  BUN 13  CREATININE 0.88  CALCIUM 8.9    Intake/Output Summary (Last 24 hours) at 08/25/2020 1019 Last data filed at 08/25/2020 0805 Gross per 24 hour  Intake 536 ml  Output --  Net 536 ml     Pressure Injury 08/09/20 Buttocks Left;Right Deep Tissue Pressure Injury - Purple or maroon localized area of discolored intact skin or blood-filled blister due to damage of underlying soft tissue from pressure and/or shear. (Active)  08/09/20 1716  Location: Buttocks  Location Orientation: Left;Right  Staging: Deep Tissue Pressure Injury - Purple or maroon localized area of discolored intact skin or blood-filled blister due to damage of underlying soft tissue from pressure and/or shear.  Wound Description (Comments):   Present on Admission: Yes    Physical Exam: BP 134/73 (BP Location: Left Arm)   Pulse 67   Temp 98.4 F (36.9 C) (Oral)   Resp 16   Ht 6\' 1"  (1.854 m)   Wt (!) 145 kg   SpO2 96%   BMI 42.17 kg/m   Gen: no distress, normal appearing HEENT: oral mucosa pink and moist, NCAT Cardio: Reg rate Chest: normal effort, normal rate of breathing Abd: soft, non-distended Ext: no edema Psych: pleasant, normal affect  Skin:  Gluteal ulcer Psych: Normal mood.  Normal behavior. Musc: Right upper extremity edema, stable No tenderness  in extremities, including proximal right forearm. Neuro: Alert Expressive aphasia, unchanged Nonverbal, stable Motor: RUE 0/5 proximal distal, unchanged RLE: 2/5 proximal to distal, improving  Assessment/Plan: 1. Functional deficits which require 3+  hours per day of interdisciplinary therapy in a comprehensive inpatient rehab setting.  Physiatrist is providing close team supervision and 24 hour management of active medical problems listed below.  Physiatrist and rehab team continue to assess barriers to discharge/monitor patient progress toward functional and medical goals   Care Tool:  Bathing    Body parts bathed by patient: Right arm,Chest,Abdomen,Left upper leg,Face,Right upper leg,Right lower leg,Left lower leg,Front perineal area   Body parts bathed by helper: Buttocks,Left arm     Bathing assist Assist Level: Moderate Assistance - Patient 50 - 74%     Upper Body Dressing/Undressing Upper body dressing   What is the patient wearing?: Pull over shirt    Upper body assist Assist Level: Moderate Assistance - Patient 50 - 74%    Lower Body Dressing/Undressing Lower body dressing      What is the patient wearing?: Pants     Lower body assist Assist for lower body dressing: Moderate Assistance - Patient 50 - 74%     Toileting Toileting    Toileting assist Assist for toileting: 2 Helpers Assistive Device Comment: Bedrails   Transfers Chair/bed transfer  Transfers assist  Chair/bed transfer activity did not occur: Safety/medical concerns  Chair/bed transfer assist level: 2 Helpers     Locomotion Ambulation   Ambulation assist   Ambulation activity did not occur: Safety/medical concerns          Walk 10 feet activity   Assist  Walk 10 feet activity did not occur: Safety/medical concerns        Walk 50 feet activity   Assist Walk 50 feet with 2 turns activity did not occur: Safety/medical concerns         Walk 150 feet activity   Assist Walk 150 feet activity did not occur: Safety/medical concerns         Walk 10 feet on uneven surface  activity   Assist Walk 10 feet on uneven surfaces activity did not occur: Safety/medical concerns         Wheelchair     Assist  Will patient use wheelchair at discharge?: Yes Type of Wheelchair: Manual    Wheelchair assist level: Minimal Assistance - Patient > 75% Max wheelchair distance: 100    Wheelchair 50 feet with 2 turns activity    Assist        Assist Level: Minimal Assistance - Patient > 75%   Wheelchair 150 feet activity     Assist      Assist Level: Moderate Assistance - Patient 50 - 74%   Medical Problem List and Plan: 1.  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  Continue CIR  WHO/PRAFO nightly 2.  Antithrombotics: -DVT/anticoagulation:  Pharmaceutical: Continue Lovenox  Dopplers negative for DVT             -antiplatelet therapy: DAPT X 3 months followed by ASA alone (changed to 81)--Plavix added on 08/09/20 3. Pain Management: Mobic DC'd  Controlled on 2/18  CT right upper extremity reviewed and shows no osteomyelitis; shows anconeus tendinosis             Monitor with increased exertion 4. Mood: LCSW to follow for evaluation and support.              -  antipsychotic agents: N/A 5. Neuropsych: This patient is not fully capable of making decisions on his own behalf. 6. Skin/Wound Care: Routine pressure relief measures.   Santyl to intergluteal ulcer 7. Fluids/Electrolytes/Nutrition: Monitor I/Os.  8. HTN: Monitor BP tid   HCTZ daily, DC'd on 2/7  Avapro decreased to 150 on 2/8  Controlled on 2/20- continue Catapres twice daily, Lopressor bid.              Monitor with increased mobility 9. T2DM with hyperglycemia: Hgb A1c-6.2.   CBG (last 3)  Recent Labs    08/24/20 1635 08/24/20 2113 08/25/20 0548  GLUCAP 158* 146* 155*      Metformin 1000 mg bid  Farxiga restarted  Amaryl DC'd  Elevated to 200s on 2/19, monitor AC/HS 10. Covid 19--Positive  on 07/24/20-->no treatment as asymptomatic. Continue zinc and vitamin C.  11. Morbid obesity: BMI 46.  Educate on appropriate diet and exercise to help promote health and mobility.              Encourage weight loss 12.  Hyperlipidemia: Crestor 13. H/o gout: continue allopurinol.  14. Hyponatremia  Na+ 134 on 2/18  Continue to monitor 15. AKI:   Cr.  0.88 on 2/18  Received IVF x3 nights ended on 2/11, improved afterward  Echo reviewed-EF 60-65%  Encourage fluids, reminded 16. Elevated liver enzymes: Resolved  ALT within normal limits on 2/11 17. Mild leukocytosis: Resolved  WBCs 8.2 on 2/14  Encourage IS as possible, OOB  See #18 18.  Acute lower UTI  UA +, urine culture showing E. coli  Completed course of Macrobid  19.  Right elbow pain  Discussed with therapies, reviewed images personally reviewed and reviewed/discussed with radiology  MRI unable to be performed due to body habitus, CT ordered 20.  Acute blood loss anemia  Hemoglobin 11.6 on 2/14, labs ordered for Monday  LOS: 16 days A FACE TO FACE EVALUATION WAS PERFORMED  Justin Mason 08/25/2020, 10:19 AM

## 2020-08-25 NOTE — Progress Notes (Signed)
Speech Language Pathology Daily Session Note  Patient Details  Name: Justin Mason MRN: [id number] Date of Birth: [birth year]  Today's Date: 08/25/2020 SLP Individual Time: 3005-1102 SLP Individual Time Calculation (min): 26 min  Short Term Goals: Week 3: SLP Short Term Goal 1 (Week 3): Patient will coordinate breath support during production of voiceless phonemes /h/, /p/, etc. with modA cues. SLP Short Term Goal 2 (Week 3): Patient will make requests using communication board/AAC device with minA after setup A. SLP Short Term Goal 3 (Week 3): Patient will produce CVCV (consonant-vowel-consonant-vowel) words (reduplicated: baba, papa, etc) with modA cues . SLP Short Term Goal 4 (Week 3): Patient will imitate to mouth one-syllable words with maxA. SLP Short Term Goal 5 (Week 3): Patient will manage right sided buccal pocketing of regular solids with intermittentA cues  Skilled Therapeutic Interventions: Pt was seen for skilled ST targeting communication goals. Pt's girlfriend was present for session and appropriately engaged. Although pt put in excellent effort and multiple attempts, no true phonation was achieved today. He did exhibit ability to whisper "ha" demonstrated appropriate lip rounding and airflow for "oo" vowel sounds. Pt unable to trigger volitional cough or grunt when attempting to "bear down" with one hand on bed rail. He did appear to become somewhat discouraged, but receptive to education about apraxia and rehabilitation process for communication deficits such as his. SLP encouraged pt to try "bearing down and grunting" exercise to target phonation/voicing in his free time. His girlfriend reports that when pt is angry or in need of something he is doing well gaining attention of his communication partner via grunting and then directing his eye contact, pointing, or using other gestures to communicate needs/desires. However, she and pt seemed interesting in further exploration of AAC  device for communication - would recommend targeting at next available session. Pt left laying in bed with needs within reach and girlfriend still present. Continue per current plan of care.      Pain Pain Assessment Pain Scale: Faces Faces Pain Scale: No hurt  Therapy/Group: Individual Therapy  Justin Mason 08/25/2020, 7:32 AM

## 2020-08-25 NOTE — Plan of Care (Signed)
  Problem: Consults Goal: RH STROKE PATIENT EDUCATION Description: See Patient Education module for education specifics  Outcome: Progressing   Problem: RH BLADDER ELIMINATION Goal: RH STG MANAGE BLADDER WITH ASSISTANCE Description: STG Manage Bladder With min Assistance Outcome: Progressing   Problem: RH PAIN MANAGEMENT Goal: RH STG PAIN MANAGED AT OR BELOW PT'S PAIN GOAL Description: Pain level less than 4 on scale of 0-10 Outcome: Progressing   Problem: RH SKIN INTEGRITY Goal: RH STG MAINTAIN SKIN INTEGRITY WITH ASSISTANCE Description: STG Maintain Skin Integrity With min Assistance. Outcome: Progressing   

## 2020-08-26 DIAGNOSIS — M792 Neuralgia and neuritis, unspecified: Secondary | ICD-10-CM

## 2020-08-26 LAB — BASIC METABOLIC PANEL
Anion gap: 10 (ref 5–15)
BUN: 12 mg/dL (ref 6–20)
CO2: 27 mmol/L (ref 22–32)
Calcium: 9.1 mg/dL (ref 8.9–10.3)
Chloride: 99 mmol/L (ref 98–111)
Creatinine, Ser: 0.9 mg/dL (ref 0.61–1.24)
GFR, Estimated: >60 mL/min (ref >60)
Glucose, Bld: 147 mg/dL — ABNORMAL HIGH (ref 70–99)
Potassium: 3.7 mmol/L (ref 3.5–5.1)
Sodium: 136 mmol/L (ref 135–145)

## 2020-08-26 LAB — CBC
HCT: 33.8 % — ABNORMAL LOW (ref 39.0–52.0)
Hemoglobin: 12 g/dL — ABNORMAL LOW (ref 13.0–17.0)
MCH: 31.1 pg (ref 26.0–34.0)
MCHC: 35.5 g/dL (ref 30.0–36.0)
MCV: 87.6 fL (ref 80.0–100.0)
Platelets: 210 10*3/uL (ref 150–400)
RBC: 3.86 MIL/uL — ABNORMAL LOW (ref 4.22–5.81)
RDW: 12.2 % (ref 11.5–15.5)
WBC: 8.8 10*3/uL (ref 4.0–10.5)
nRBC: 0 % (ref 0.0–0.2)

## 2020-08-26 LAB — GLUCOSE, CAPILLARY
Glucose-Capillary: 159 mg/dL — ABNORMAL HIGH (ref 70–99)
Glucose-Capillary: 103 mg/dL — ABNORMAL HIGH (ref 70–99)
Glucose-Capillary: 164 mg/dL — ABNORMAL HIGH (ref 70–99)

## 2020-08-26 MED ORDER — GABAPENTIN 100 MG PO CAPS
100.0000 mg | ORAL_CAPSULE | Freq: Three times a day (TID) | ORAL | Status: DC
  Administered 2020-08-26 – 2020-09-06 (×34): 100 mg via ORAL
  Filled 2020-08-26 (×34): qty 1

## 2020-08-26 NOTE — Progress Notes (Signed)
Physical Therapy Session Note  Patient Details  Name: Justin Mason MRN: [id number] Date of Birth: October 19, 1980  Today's Date: 08/26/2020 PT Individual Time: 1100-1138 PT Individual Time Calculation (min): 38 min   Today's Date: 08/26/2020 PT Missed Time: 22 Minutes Missed Time Reason: Pain  Short Term Goals: Week 2:  PT Short Term Goal 1 (Week 2): Pt will perform bed mobility with mod assist PT Short Term Goal 1 - Progress (Week 2): Met PT Short Term Goal 2 (Week 2): Pt will transfer bed<>WC with LRAD and min A of 1 PT Short Term Goal 2 - Progress (Week 2): Progressing toward goal PT Short Term Goal 3 (Week 2): pt will initiate gait training PT Short Term Goal 3 - Progress (Week 2): Progressing toward goal Week 3:  PT Short Term Goal 1 (Week 3): Pt will transfer bed<>WC with LRAD and min A of 1 PT Short Term Goal 2 (Week 3): pt will initiate gait training PT Short Term Goal 3 (Week 3): Pt will transfer sit<>stand with max of 1 consistantly  Skilled Therapeutic Interventions/Progress Updates:   Received pt sitting in WC, pt agreeable to therapy, and reported pain in R shoulder. Pt able to communicate through gestures that he has already received pain medication. Per OT, pt with increased difficulty standing this morning and unable to stand in stedy despite total A +2. Pt transported to therapy gym in [facility] total A for time management purposes and began transferring to mat using slideboard. Pt required total A to place board but with increased difficulty performing transfer and unable to sequence head/hips relationship and scooting technique. Pt with deep sighs and appeared frustrated. Therapist asked if pt was ok and he shook his head "no". Asked pt if he was having a bad day and he nodded "yes" and pointed to his L knee. Pt communicated that his L knee began hurting yesterday (potentially why he was unable to stand with OT this morning). Pt became tearful and emotional and requested to return to  room. Therapist provided emotional support and encouragement and transported pt back to room in [facility] total A and performed seated active assisted towel heel slides on RLE with emphasis on hamstring activation x10 reps. NT present to assess blood glucose levels and therapist provided ice pack for pt's L knee. Concluded session with pt sitting in WC, needs within reach, and seatbelt alarm on. RN aware of pt's pain levels. 22 minutes missed of skilled physical therapy due to pain.   Therapy Documentation Precautions:  Precautions Precautions: Fall Precaution Comments: Significant Right sided hemi; intermittent right sided lean Restrictions Weight Bearing Restrictions: No   Therapy/Group: Individual Therapy Yann Biehn PT, DPT   08/26/2020, 7:32 AM

## 2020-08-26 NOTE — Progress Notes (Signed)
[place] PHYSICAL MEDICINE & REHABILITATION PROGRESS NOTE  Subjective/Complaints: Justin Mason up at EOB with Justin Mason. No new issues. Sweating a lot. Pain in right upper ext, esp in hand apparently. Otherwise has been working hard.   ROS: limited due to language/communication    Objective: Vital Signs: Blood pressure 125/69, pulse 72, temperature 98.2 F (36.8 C), resp. rate 14, height 6\' 1"  (1.854 m), weight (!) 155 kg, SpO2 98 %. CT FOREARM RIGHT W CONTRAST  Result Date: 08/24/2020 CLINICAL DATA:  Right forearm pain and swelling. Question osteomyelitis. EXAM: CT OF THE UPPER RIGHT EXTREMITY WITH CONTRAST TECHNIQUE: Multidetector CT imaging of the upper right extremity was performed according to the standard protocol following intravenous contrast administration. CONTRAST:  100 mL OMNIPAQUE IOHEXOL 300 MG/ML  SOLN COMPARISON:  Plain films right forearm 08/21/2020. FINDINGS: Bones/Joint/Cartilage There is no evidence of osteomyelitis. Mild cortical irregularity of the dorsal margin of the proximal ulna seen on prior plain films is in the region of the anconeus tendon insertion and likely due to chronic, mild tug lesion or tendinosis. Subtle calcifications are seen in the distal anconeus tendon. 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 Small spur at the triceps tendon insertion and a small calcification in the distal triceps tendon noted. No joint effusion. Ligaments Suboptimally assessed by CT. Muscles and Tendons Intact. No intramuscular fluid collection. No gas within muscle or tracking along fascial planes. As described above. Soft tissues Head no focal fluid collection or mass. There is some subcutaneous edema about the dorsal aspect of the forearm. IMPRESSION: Negative for osteomyelitis. 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 Mild  stranding in subcutaneous tissues of the forearm may be due to cellulitis or dependent change. Negative for abscess, myositis or septic joint. 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 Electronically Signed   By: 08/23/2020 M.D.   On: 08/24/2020 17:55   Recent Labs    08/26/20 0502  WBC 8.8  HGB 12.0*  HCT 33.8*  PLT 210   Recent Labs    08/26/20 0502  NA 136  K 3.7  CL 99  CO2 27  GLUCOSE 147*  BUN 12  CREATININE 0.90  CALCIUM 9.1    Intake/Output Summary (Last 24 hours) at 08/26/2020 08/28/2020 Last data filed at 08/25/2020 1815 Gross per 24 hour  Intake 413 ml  Output 252 ml  Net 161 ml     Pressure Injury 08/09/20 Buttocks Left;Right Deep Tissue Pressure Injury - Purple or maroon localized area of discolored intact skin or blood-filled blister due to damage of underlying soft tissue from pressure and/or shear. (Active)  08/09/20 1716  Location: Buttocks  Location Orientation: Left;Right  Staging: Deep Tissue Pressure Injury - Purple or maroon localized area of discolored intact skin or blood-filled blister due to damage of underlying soft tissue from pressure and/or shear.  Wound Description (Comments):   Present on Admission: Yes    Physical Exam: BP 125/69 (BP Location: Left Arm)   Pulse 72   Temp 98.2 F (36.8 C)   Resp 14   Ht 6\' 1"  (1.854 m)   Wt (!) 155 kg   SpO2 98%   BMI 45.08 kg/m   Constitutional: No distress . Vital signs reviewed. obese HEENT: EOMI, oral membranes moist Neck: supple Cardiovascular: RRR without murmur. No JVD    Respiratory/Chest: CTA Bilaterally without wheezes or rales. Normal  effort    GI/Abdomen: BS +, non-tender, non-distended Ext: no clubbing, cyanosis, or edema Psych: pleasant, smiling Skin: diaphoretic Gluteal ulcer Psych: Normal mood.  Normal behavior. Musc: Right upper extremity edema, stable No tenderness in extremities, including proximal right forearm. Neuro: Alert Expressive aphasia. Comprehension  seems accurate. Nonverbal, stable Motor: RUE 0-tr/5 proximal to 0/5 distal  RLE: 2/5 proximal to distal, improving  Assessment/Plan: 1. Functional deficits which require 3+ hours per day of interdisciplinary therapy in a comprehensive inpatient rehab setting.  Physiatrist is providing close team supervision and 24 hour management of active medical problems listed below.  Physiatrist and rehab team continue to assess barriers to discharge/monitor patient progress toward functional and medical goals   Care Tool:  Bathing    Body parts bathed by patient: Right arm,Chest,Abdomen,Left upper leg,Face,Right upper leg,Right lower leg,Left lower leg,Front perineal area   Body parts bathed by helper: Buttocks,Left arm     Bathing assist Assist Level: Moderate Assistance - Patient 50 - 74%     Upper Body Dressing/Undressing Upper body dressing   What is the patient wearing?: Pull over shirt    Upper body assist Assist Level: Moderate Assistance - Patient 50 - 74%    Lower Body Dressing/Undressing Lower body dressing      What is the patient wearing?: Pants     Lower body assist Assist for lower body dressing: Moderate Assistance - Patient 50 - 74%     Toileting Toileting    Toileting assist Assist for toileting: 2 Helpers Assistive Device Comment: Bedrails   Transfers Chair/bed transfer  Transfers assist  Chair/bed transfer activity did not occur: Safety/medical concerns  Chair/bed transfer assist level: 2 Helpers     Locomotion Ambulation   Ambulation assist   Ambulation activity did not occur: Safety/medical concerns          Walk 10 feet activity   Assist  Walk 10 feet activity did not occur: Safety/medical concerns        Walk 50 feet activity   Assist Walk 50 feet with 2 turns activity did not occur: Safety/medical concerns         Walk 150 feet activity   Assist Walk 150 feet activity did not occur: Safety/medical concerns          Walk 10 feet on uneven surface  activity   Assist Walk 10 feet on uneven surfaces activity did not occur: Safety/medical concerns         Wheelchair     Assist Will patient use wheelchair at discharge?: Yes Type of Wheelchair: Manual    Wheelchair assist level: Minimal Assistance - Patient > 75% Max wheelchair distance: 100    Wheelchair 50 feet with 2 turns activity    Assist        Assist Level: Minimal Assistance - Patient > 75%   Wheelchair 150 feet activity     Assist      Assist Level: Moderate Assistance - Patient 50 - 74%   Medical Problem List and Plan: 1.  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  Continue CIR  WHO/PRAFO nightly--elevate/support RUE while in bed/chair 2.  Antithrombotics: -DVT/anticoagulation:  Pharmaceutical: Continue Lovenox  Dopplers negative for DVT             -antiplatelet therapy: DAPT X 3 months followed by ASA alone (changed to 81)--Plavix added on 08/09/20 3. Pain Management: Mobic DC'd  2/21 Right elbow pain:  CT performed and really unremarkable except for chronic anconeus tendinosis---probably accounts for "knot" on ulna   - Given negative CT findings, have to consider that this is neuropathic pain. Begin trial of gabapentin 100mg  tid 4. Mood: LCSW to follow for evaluation and support.              -antipsychotic agents: N/A 5. Neuropsych: This patient is not fully capable of making decisions on his own behalf. 6. Skin/Wound Care: Routine pressure relief measures.   Santyl to intergluteal ulcer 7. Fluids/Electrolytes/Nutrition: Monitor I/Os.  8. HTN: Monitor BP tid   HCTZ daily, DC'd on 2/7  Avapro decreased to 150 on 2/8  Controlled on 2/21- continue Catapres twice daily, Lopressor bid.                9. T2DM with hyperglycemia: Hgb A1c-6.2.   CBG (last 3)  Recent Labs     08/25/20 1129 08/25/20 1707 08/25/20 2058  GLUCAP 127* 147* 126*      Metformin 1000 mg bid  Farxiga restarted  Amaryl DC'd  Elevated to 200s on 2/19, monitor AC/HS 10. Covid 19--Positive  on 07/24/20-->no treatment as asymptomatic. Continue zinc and vitamin C.  11. Morbid obesity: BMI 46. Educate on appropriate diet and exercise to help promote health and mobility.              Encourage weight loss 12.  Hyperlipidemia: Crestor 13. H/o gout: continue allopurinol.  14. Hyponatremia  Na+ 134 on 2/18--> 136 2/21    15. AKI:   Cr.  0.88 on 2/18-->0.9 2/21  Received IVF x3 nights ended on 2/11   Echo: EF 60-65%  Continue to encourage fluids 16. Elevated liver enzymes: Resolved  ALT within normal limits on 2/11 17. Mild leukocytosis: Resolved  WBCs 8.2 on 2/14  Encourage IS as possible, OOB  See #18 18.  Acute lower UTI  UA +, urine culture showing E. coli  Completed course of Macrobid  20.  Acute blood loss anemia  Hemoglobin 11.6 on 2/14--->12.0 2/21  LOS: 17 days A FACE TO FACE EVALUATION WAS PERFORMED  3/21 08/26/2020, 9:26 AM

## 2020-08-26 NOTE — Progress Notes (Signed)
Speech Language Pathology Daily Session Note  Patient Details  Name: Justin Mason MRN: [id number] Date of Birth: [birth year]  Today's Date: 08/26/2020 SLP Individual Time: 2774-1287 SLP Individual Time Calculation (min): 29 min  Short Term Goals: Week 3: SLP Short Term Goal 1 (Week 3): Patient will coordinate breath support during production of voiceless phonemes /h/, /p/, etc. with modA cues. SLP Short Term Goal 2 (Week 3): Patient will make requests using communication board/AAC device with minA after setup A. SLP Short Term Goal 3 (Week 3): Patient will produce CVCV (consonant-vowel-consonant-vowel) words (reduplicated: baba, papa, etc) with modA cues . SLP Short Term Goal 4 (Week 3): Patient will imitate to mouth one-syllable words with maxA. SLP Short Term Goal 5 (Week 3): Patient will manage right sided buccal pocketing of regular solids with intermittentA cues  Skilled Therapeutic Interventions: Pt seen for skilled ST service with focus on communication goals, pt sitting up in w/c. Pt encouraged to utilize "bear down" with L arm on wheelchair to attempt to increase voicing during tasks. Pt producing whispered "ha" x8 and approximating lips for "b" "p" and "oo". Pt able to mouth simple "b" words with 75% accuracy, benefits from mirror and verbal cues to complete final sounds in words. Mirror assists in increasing patient's awareness of oral apraxia and attempts to correct errors. Pt continues to put in excellent effort and multiple attempts for verbalizations, however true phonation not achieved. Pt left in w/c with belt alarm set and all needs within reach. Cont ST POC.   Pain Pain Assessment Pain Scale: 0-10 Pain Score: 5  Pain Location: Elbow Pain Intervention(s): RN made aware  Therapy/Group: Individual Therapy  Tacey Ruiz 08/26/2020, 10:40 AM

## 2020-08-26 NOTE — Progress Notes (Signed)
Occupational Therapy Weekly Progress Note  Patient Details  Name: Justin Mason MRN: [id number] Date of Birth: [birth year]  Beginning of progress report period: August 19, 2020 End of progress report period: August 26, 2020     Patient has met 0 of 3 short term goals.  Patient continues to make progress in all areas but continues to struggle with self care and mobility due to ongoing right side weakness and increased pain in left knee.    Patient continues to demonstrate the following deficits: muscle weakness and muscle joint tightness, decreased cardiorespiratoy endurance, unbalanced muscle activation, motor apraxia, decreased coordination and decreased motor planning, decreased problem solving and decreased safety awareness and decreased standing balance, decreased postural control and hemiplegia and therefore will continue to benefit from skilled OT intervention to enhance overall performance with BADL, iADL and Reduce care partner burden.  Patient progressing toward long term goals..  Continue plan of care.  OT Short Term Goals Week 2:  OT Short Term Goal 1 (Week 2): Pt will don pants with mod assist using AE as needed OT Short Term Goal 1 - Progress (Week 2): Progressing toward goal OT Short Term Goal 2 (Week 2): Pt will complete toilet transfer with mod assist +2 using LRAD. OT Short Term Goal 2 - Progress (Week 2): Progressing toward goal OT Short Term Goal 3 (Week 2): Pt will complete sit > stand with UE support with mod assist of one caregiver to progress towards LB dressing at sit > stand level OT Short Term Goal 3 - Progress (Week 2): Progressing toward goal Week 3:  OT Short Term Goal 1 (Week 3): patient will doff/donn OH shirt with min A OT Short Term Goal 2 (Week 3): Patient will doff/donn pants min A using LRAD and complete clothing management with max A OT Short Term Goal 3 (Week 3): patient will complete SB transfer to/from bed, drop arm commode and w/c with mod A of  one    Therapy/Group:   Carlos Levering 08/26/2020, 3:51 PM

## 2020-08-26 NOTE — Progress Notes (Signed)
Occupational Therapy Session Note  Patient Details  Name: Justin Mason MRN: [id number] Date of Birth: 06/25/1981  Today's Date: 08/26/2020 OT Individual Time: 9937-1696   &   1415-1505 OT Individual Time Calculation (min): 62 min & 50 min   Short Term Goals: Week 2:  OT Short Term Goal 1 (Week 2): Pt will don pants with mod assist using AE as needed OT Short Term Goal 2 (Week 2): Pt will complete toilet transfer with mod assist +2 using LRAD. OT Short Term Goal 3 (Week 2): Pt will complete sit > stand with UE support with mod assist of one caregiver to progress towards LB dressing at sit > stand level  Skilled Therapeutic Interventions/Progress Updates:    AM session:   Patient in bed,alert and ready for therapy session.   Continues to be non-verbal but able to indicate wants and needs.  He notes pain in right arm with change of position and distal tightness after removal of RHS.  Brief soaked with urine - dependent to change and provide clean bed pads.  Supine to sitting edge of bed with mod A.  Discussed and prepped to complete shower using stedy this am but he was unable to stand in stedy from elevated bed surface with assist of two onto stedy surface - multiple attempts made and changed plan to include bed level lower body bathing and dressing.  Max A to return to supine position.  LB bathing max A, donning shorts/clean brief, socks/shoes max / dep.  To w/c via SB min/mod A of 2.  He is able to wash upper body w/c level with min A for left and right arms, doff/donn OH shirt mod A.  He remained seated in w/c at close of session, seat belt alarm set and call bell in hand.     PM session:   Patient in bed, alert, ice on left knee - he notes pain at level "8" in left knee at this time.  He requests to stay in bed but agrees to participate in bed level activities.  Completed right UE NMRE in supine position with focus on scapular mobility, inhibition of flexors and facilitation of extensors.  He  continues with right elbow/forearm discomfort with supination, index PIP and MP tightness (chronic per family).  kinesiotape reapplied for right hand edema and facilitation of wrist extensors.  Reviewed positioning and joint safety.  He remained in bed at close of session.  Call bell in hand.      Therapy Documentation Precautions:  Precautions Precautions: Fall Precaution Comments: Significant Right sided hemi; intermittent right sided lean Restrictions Weight Bearing Restrictions: No   Therapy/Group: Individual Therapy  Barrie Lyme 08/26/2020, 7:32 AM

## 2020-08-26 NOTE — Plan of Care (Signed)
  Problem: Consults Goal: RH STROKE PATIENT EDUCATION Description: See Patient Education module for education specifics  Outcome: Progressing   Problem: RH BLADDER ELIMINATION Goal: RH STG MANAGE BLADDER WITH ASSISTANCE Description: STG Manage Bladder With min Assistance Outcome: Progressing   Problem: RH PAIN MANAGEMENT Goal: RH STG PAIN MANAGED AT OR BELOW PT'S PAIN GOAL Description: Pain level less than 4 on scale of 0-10 Outcome: Progressing   Problem: RH SKIN INTEGRITY Goal: RH STG MAINTAIN SKIN INTEGRITY WITH ASSISTANCE Description: STG Maintain Skin Integrity With min Assistance. Outcome: Progressing   

## 2020-08-27 LAB — GLUCOSE, CAPILLARY
Glucose-Capillary: 140 mg/dL — ABNORMAL HIGH (ref 70–99)
Glucose-Capillary: 111 mg/dL — ABNORMAL HIGH (ref 70–99)
Glucose-Capillary: 115 mg/dL — ABNORMAL HIGH (ref 70–99)
Glucose-Capillary: 142 mg/dL — ABNORMAL HIGH (ref 70–99)

## 2020-08-27 NOTE — Progress Notes (Signed)
Physical Therapy Session Note  Patient Details  Name: Justin Mason MRN: [id number] Date of Birth: 01-17-1981  Today's Date: 08/27/2020 PT Individual Time: 1300-1417 PT Individual Time Calculation (min): 77 min   Short Term Goals: Week 3:  PT Short Term Goal 1 (Week 3): Pt will transfer bed<>WC with LRAD and min A of 1 PT Short Term Goal 2 (Week 3): pt will initiate gait training PT Short Term Goal 3 (Week 3): Pt will transfer sit<>stand with max of 1 consistantly  Skilled Therapeutic Interventions/Progress Updates:    Patient in supine with girlfriend in the room initiating change of brief and shorts due to urinary incontinence.  Patient rolling with mod to max A and attempted to help with L side pulling up shorts.  Patient side to sit on EOB with mod A for trunk.  Patient transferred to w/c via slide board with +2 max A to initiate progressing to mod A once initiated.  Total A for board placement.  Patient pushed in w/c to therapy gym.  SBT to mat as previous.    Sit to stand x 4 from elevated mat to [facility] walker with +2 mod to max A and max cues with facilitation for anterior weight shift, use of momentum and foot position.  Assisted each time to place R UE on walker.  Standing about 60 seconds each time using towel at R knee to improve extension.  Patient sit to supine with mod A on mat, scooting to R mod A using bridging method.    To L sidelying mod to max A.  Performed R LE/pelvic facilitation for hip and knee flexion/extension and pushing down into resistance on powder board with cues and facilitation to active proximally.  Patient side to sit with min to mod A.  SBT to w/c with +2 as previous and increased time.  Assisted in w/c to room and left with call bell in reach and RN aware, girlfriend out of room but returned shortly.   Therapy Documentation Precautions:  Precautions Precautions: Fall Precaution Comments: Significant Right sided hemi; intermittent right sided  lean Restrictions Weight Bearing Restrictions: No Pain: Pain Assessment Pain Scale: 0-10 Pain Score: 0-No pain   Therapy/Group: Individual Therapy  Elray Mcgregor  Sheran Lawless, [place] 08/27/2020, 4:47 PM

## 2020-08-27 NOTE — Progress Notes (Signed)
Speech Language Pathology Daily Session Note  Patient Details  Name: Justin Mason MRN: [id number] Date of Birth: 1980-12-04  Today's Date: 08/27/2020 SLP Individual Time: 1003-1100 SLP Individual Time Calculation (min): 57 min  Short Term Goals: Week 3: SLP Short Term Goal 1 (Week 3): Patient will coordinate breath support during production of voiceless phonemes /h/, /p/, etc. with modA cues. SLP Short Term Goal 2 (Week 3): Patient will make requests using communication board/AAC device with minA after setup A. SLP Short Term Goal 3 (Week 3): Patient will produce CVCV (consonant-vowel-consonant-vowel) words (reduplicated: baba, papa, etc) with modA cues . SLP Short Term Goal 4 (Week 3): Patient will imitate to mouth one-syllable words with maxA. SLP Short Term Goal 5 (Week 3): Patient will manage right sided buccal pocketing of regular solids with intermittentA cues  Skilled Therapeutic Interventions:Skilled ST services focused on speech skills. SLP facilitated phonation of voiceless /h/ and /p/ fading to min A verbal cues for breath support. SLP presented pictures for the following trials in which pt was able to produce accurate oral placement of CV and CVC words; initial /p/ words in 5 out 7 trials with ability to repeat 6 out 7 words; initial /b/ words in 3 out 12 trials with ability to repeat 5 out 12 words; initial /m/ words in 5 out 9 trials and initial /d/ words in 4 out 10 trials. Pt was able to phonate, with very low intensity on 5-7 opportunities. Pt was left in room with call bell within reach and bed alarm set. SLP recommends to continue skilled services.     Pain Pain Assessment Pain Scale: 0-10 Pain Score: 0-No pain  Therapy/Group: Individual Therapy  [place]  [facility] 08/27/2020, 5:10 PM

## 2020-08-27 NOTE — Progress Notes (Signed)
Occupational Therapy Session Note  Patient Details  Name: Justin Mason MRN: [id number] Date of Birth: [birth year]  Today's Date: 08/27/2020 OT Individual Time: 0756-0900 OT Individual Time Calculation (min): 64 min    Short Term Goals: Week 3:  OT Short Term Goal 1 (Week 3): patient will doff/donn OH shirt with min A OT Short Term Goal 2 (Week 3): Patient will doff/donn pants min A using LRAD and complete clothing management with max A OT Short Term Goal 3 (Week 3): patient will complete SB transfer to/from bed, drop arm commode and w/c with mod A of one  Skilled Therapeutic Interventions/Progress Updates:    Patient in bed, alert and ready for therapy.  He notes ongoing pain in left knee - level "6".  Completed LB dressing bed level due to knee pain - requires mod A for pants over feet and over hips via rolling side to side.  He is able to roll right with min A, left mod A.  Side lying to sitting edge of bed with mod/max A.  SB transfer to w/c mod A of 2.  He completed UB bathing and dressing w/c level with mod A.  Oral care with set up.   Completed right UE NMRE - he was able to move scapula in all directions, trace at shoulder, trace elbow flexion, 1/4 extension (and able to take resistance with weight bearing), moved thumb/hand this session.  Returned to bed via SB max A of 2 due to air mattress and difficulty with positioning.  Sit to supine max A.  He remained in bed at close of session, call bell in hand.    Therapy Documentation Precautions:  Precautions Precautions: Fall Precaution Comments: Significant Right sided hemi; intermittent right sided lean Restrictions Weight Bearing Restrictions: No   Therapy/Group: Individual Therapy  Barrie Lyme 08/27/2020, 7:35 AM

## 2020-08-27 NOTE — Progress Notes (Signed)
[place] PHYSICAL MEDICINE & REHABILITATION PROGRESS NOTE  Subjective/Complaints: Patient seen laying in bed this morning.  He indicates he slept well overnight.  Strength improving right lower extremity  ROS: limited due to language   Objective: Vital Signs: Blood pressure 131/75, pulse 67, temperature 98.7 F (37.1 C), temperature source Oral, resp. rate 16, height 6\' 1"  (1.854 m), weight (!) 155 kg, SpO2 97 %. No results found. Recent Labs    08/26/20 0502  WBC 8.8  HGB 12.0*  HCT 33.8*  PLT 210   Recent Labs    08/26/20 0502  NA 136  K 3.7  CL 99  CO2 27  GLUCOSE 147*  BUN 12  CREATININE 0.90  CALCIUM 9.1    Intake/Output Summary (Last 24 hours) at 08/27/2020 0827 Last data filed at 08/26/2020 1840 Gross per 24 hour  Intake 417 ml  Output --  Net 417 ml     Pressure Injury 08/09/20 Buttocks Left;Right Deep Tissue Pressure Injury - Purple or maroon localized area of discolored intact skin or blood-filled blister due to damage of underlying soft tissue from pressure and/or shear. (Active)  08/09/20 1716  Location: Buttocks  Location Orientation: Left;Right  Staging: Deep Tissue Pressure Injury - Purple or maroon localized area of discolored intact skin or blood-filled blister due to damage of underlying soft tissue from pressure and/or shear.  Wound Description (Comments):   Present on Admission: Yes    Physical Exam: BP 131/75 (BP Location: Left Arm)   Pulse 67   Temp 98.7 F (37.1 C) (Oral)   Resp 16   Ht 6\' 1"  (1.854 m)   Wt (!) 155 kg   SpO2 97%   BMI 45.08 kg/m   Constitutional: No distress . Vital signs reviewed.  Morbidly obese. HENT: Normocephalic.  Atraumatic. Eyes: EOMI. No discharge. Cardiovascular: No JVD.  RRR. Respiratory: Normal effort.  No stridor.  Bilateral clear to auscultation. GI: Non-distended.  BS +. Skin: Warm and dry.   Psych: Normal mood.  Normal behavior. Musc: Right upper extremity edema, unchanged Right elbow  tenderness Neuro: Alert Expressive aphasia Nonverbal, stable Motor: RUE 0/5 proximal to 0/5 distal  RLE: 3 -/5 proximal to distal, improving  Assessment/Plan: 1. Functional deficits which require 3+ hours per day of interdisciplinary therapy in a comprehensive inpatient rehab setting.  Physiatrist is providing close team supervision and 24 hour management of active medical problems listed below.  Physiatrist and rehab team continue to assess barriers to discharge/monitor patient progress toward functional and medical goals   Care Tool:  Bathing    Body parts bathed by patient: Right arm,Chest,Abdomen,Left upper leg,Face,Right upper leg,Right lower leg,Left lower leg,Front perineal area   Body parts bathed by helper: Buttocks,Left arm,Right arm,Right lower leg,Left lower leg     Bathing assist Assist Level: Moderate Assistance - Patient 50 - 74%     Upper Body Dressing/Undressing Upper body dressing   What is the patient wearing?: Pull over shirt    Upper body assist Assist Level: Moderate Assistance - Patient 50 - 74%    Lower Body Dressing/Undressing Lower body dressing      What is the patient wearing?: Pants,Incontinence brief     Lower body assist Assist for lower body dressing: Maximal Assistance - Patient 25 - 49%     Toileting Toileting    Toileting assist Assist for toileting: Total Assistance - Patient < 25% Assistive Device Comment: Bedrails   Transfers Chair/bed transfer  Transfers assist  Chair/bed transfer activity did not  occur: Safety/medical concerns  Chair/bed transfer assist level: 2 Helpers     Locomotion Ambulation   Ambulation assist   Ambulation activity did not occur: Safety/medical concerns          Walk 10 feet activity   Assist  Walk 10 feet activity did not occur: Safety/medical concerns        Walk 50 feet activity   Assist Walk 50 feet with 2 turns activity did not occur: Safety/medical concerns          Walk 150 feet activity   Assist Walk 150 feet activity did not occur: Safety/medical concerns         Walk 10 feet on uneven surface  activity   Assist Walk 10 feet on uneven surfaces activity did not occur: Safety/medical concerns         Wheelchair     Assist Will patient use wheelchair at discharge?: Yes Type of Wheelchair: Manual    Wheelchair assist level: Minimal Assistance - Patient > 75% Max wheelchair distance: 100    Wheelchair 50 feet with 2 turns activity    Assist        Assist Level: Minimal Assistance - Patient > 75%   Wheelchair 150 feet activity     Assist      Assist Level: Moderate Assistance - Patient 50 - 74%   Medical Problem List and Plan: 1.  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  Continue CIR  WHO/PRAFO nightly--elevate/support RUE while in bed/chair 2.  Antithrombotics: -DVT/anticoagulation:  Pharmaceutical: Continue Lovenox  Dopplers negative for DVT             -antiplatelet therapy: DAPT X 3 months followed by ASA alone (changed to 81)--Plavix added on 08/09/20 3. Pain Management: Mobic DC'd   Right elbow pain:  CT performed and really unremarkable except for chronic anconeus tendinosis  Trial of gabapentin 100mg  tid 4. Mood: LCSW to follow for evaluation and support.              -antipsychotic agents: N/A 5. Neuropsych: This patient is not fully capable of making decisions on his own behalf. 6. Skin/Wound Care: Routine pressure relief measures.   Santyl to intergluteal ulcer 7. Fluids/Electrolytes/Nutrition: Monitor I/Os.  8. HTN: Monitor BP tid   HCTZ daily, DC'd on 2/7  Avapro decreased to 150 on 2/8  Catapres twice daily, Lopressor bid.   Relatively controlled on 2/22               9. T2DM with hyperglycemia: Hgb A1c-6.2.   CBG (last 3)  Recent Labs    08/26/20 1646  08/26/20 2128 08/27/20 0635  GLUCAP 103* 164* 140*      Metformin 1000 mg bid  Farxiga restarted  Amaryl DC'd  Slightly labile with varying p.o. intake 10. Covid 19--Positive  on 07/24/20-->no treatment as asymptomatic. Continue zinc and vitamin C.  11. Morbid obesity: BMI 46. Educate on appropriate diet and exercise to help promote health and mobility.              Encourage weight loss 12.  Hyperlipidemia: Crestor 13. H/o gout: continue allopurinol.  14. Hyponatremia  Na+ 136 on 2/21   15. AKI:   Cr.  0.90 on 2/21  Received IVF x3 nights ended on 2/11   Echo: EF 60-65%  Continue to encourage fluids 16. Elevated liver enzymes: Resolved  ALT within normal limits on  2/11 17. Mild leukocytosis: Resolved  WBCs 8.2 on 2/14  Encourage IS as possible, OOB  See #18 18.  Acute lower UTI  UA +, urine culture showing E. coli  Completed course of Macrobid  20.  Acute blood loss anemia  Hemoglobin 12.0 on 2/21  LOS: 18 days A FACE TO FACE EVALUATION WAS PERFORMED  Justin Mason Karis Juba 08/27/2020, 8:27 AM

## 2020-08-28 ENCOUNTER — Inpatient Hospital Stay (HOSPITAL_COMMUNITY): Payer: Managed Care, Other (non HMO)

## 2020-08-28 DIAGNOSIS — Z8679 Personal history of other diseases of the circulatory system: Secondary | ICD-10-CM

## 2020-08-28 LAB — GLUCOSE, CAPILLARY
Glucose-Capillary: 164 mg/dL — ABNORMAL HIGH (ref 70–99)
Glucose-Capillary: 117 mg/dL — ABNORMAL HIGH (ref 70–99)
Glucose-Capillary: 116 mg/dL — ABNORMAL HIGH (ref 70–99)
Glucose-Capillary: 148 mg/dL — ABNORMAL HIGH (ref 70–99)

## 2020-08-28 IMAGING — CR DG KNEE COMPLETE 4+V*L*
4 series · 5 of 5 positions shown · non-contrast
Comparison: None.

CLINICAL DATA: Knee pain

EXAM:
LEFT KNEE - COMPLETE 4+ VIEW

[knee ap]
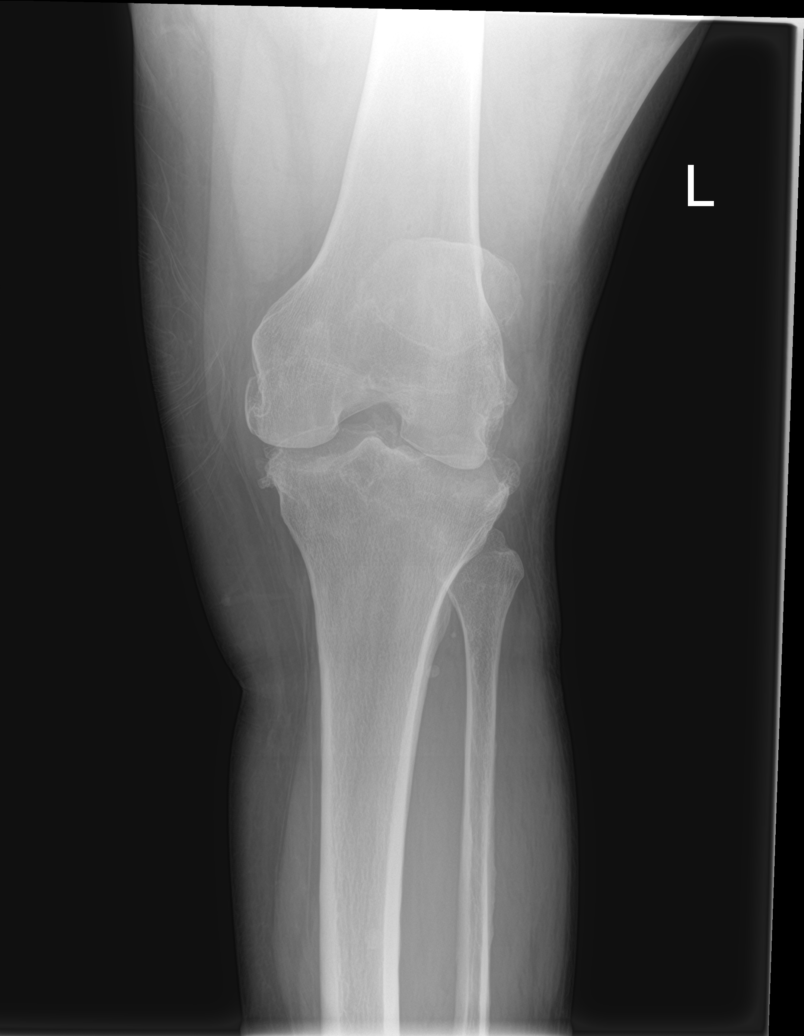

[knee lat]
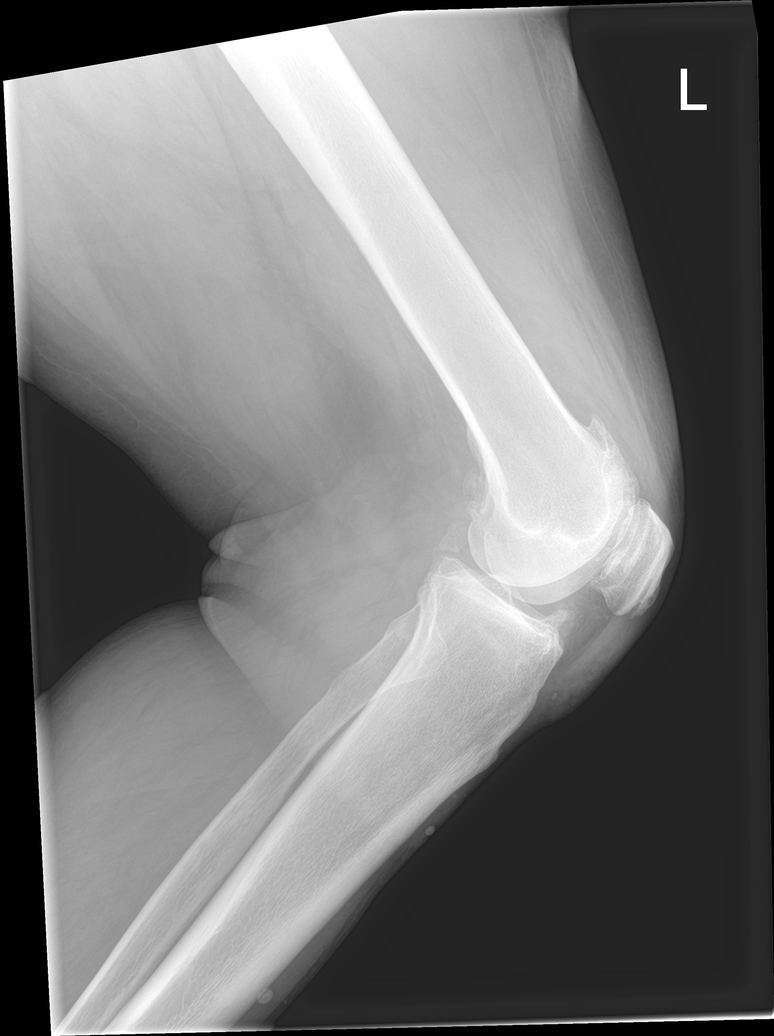

[Series 5: knee obl · 0.14mm/px · 2 of 2 slices shown (1 of 2)]
[im 1/2]
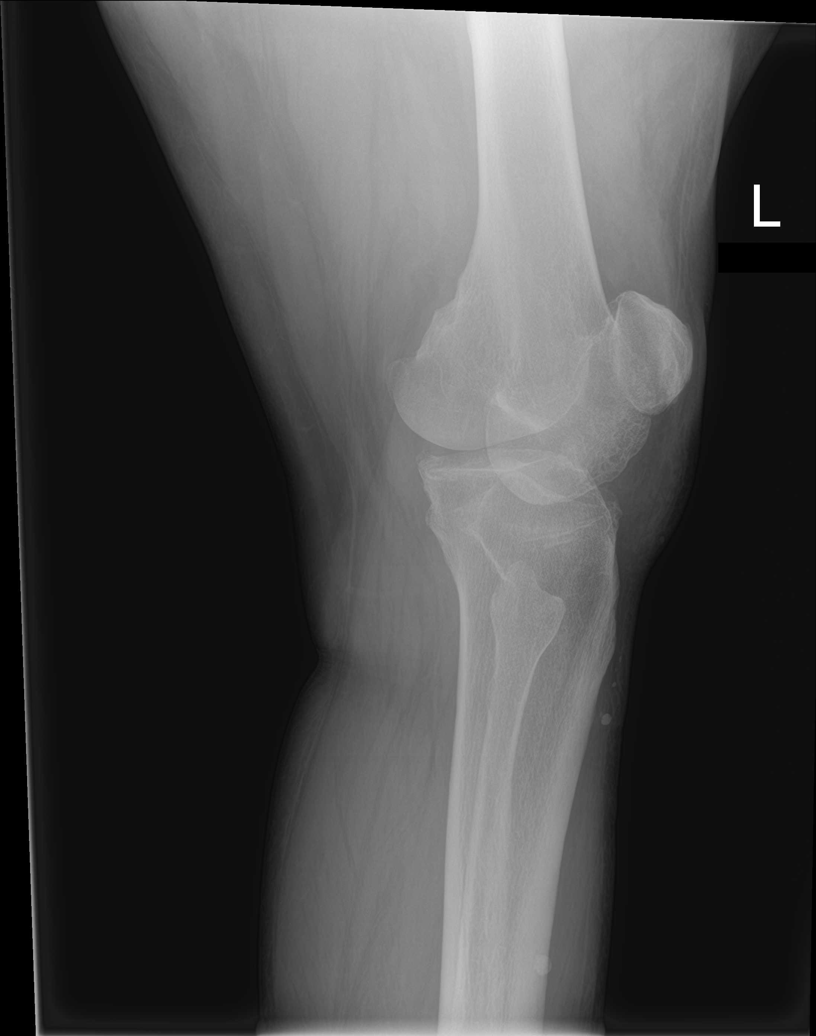
[im 2/2]
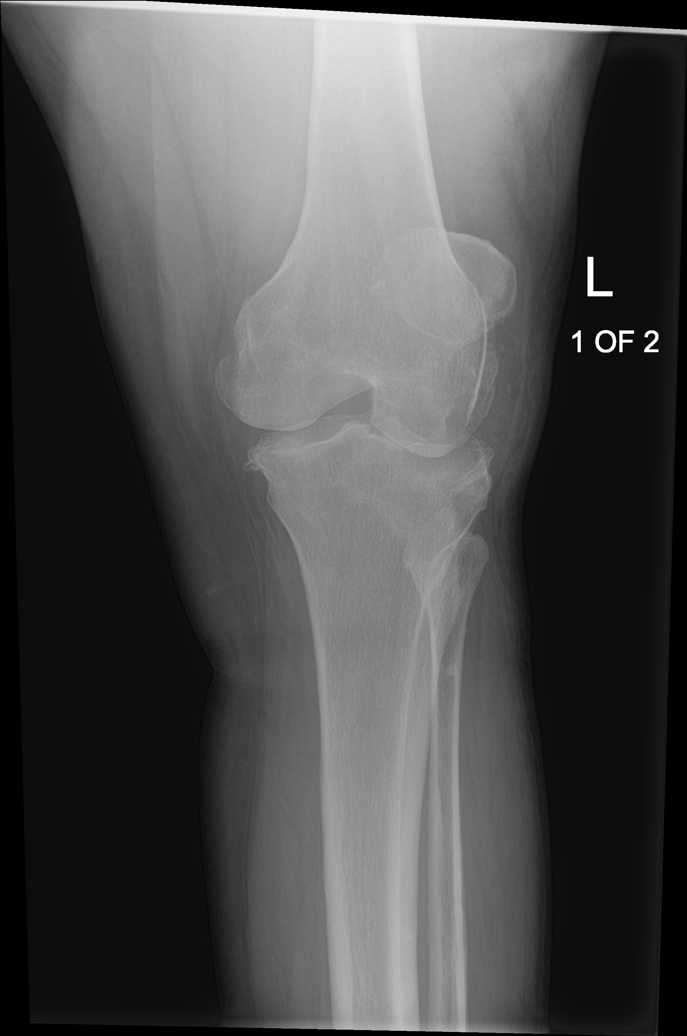

[knee obl (2 of 2)]
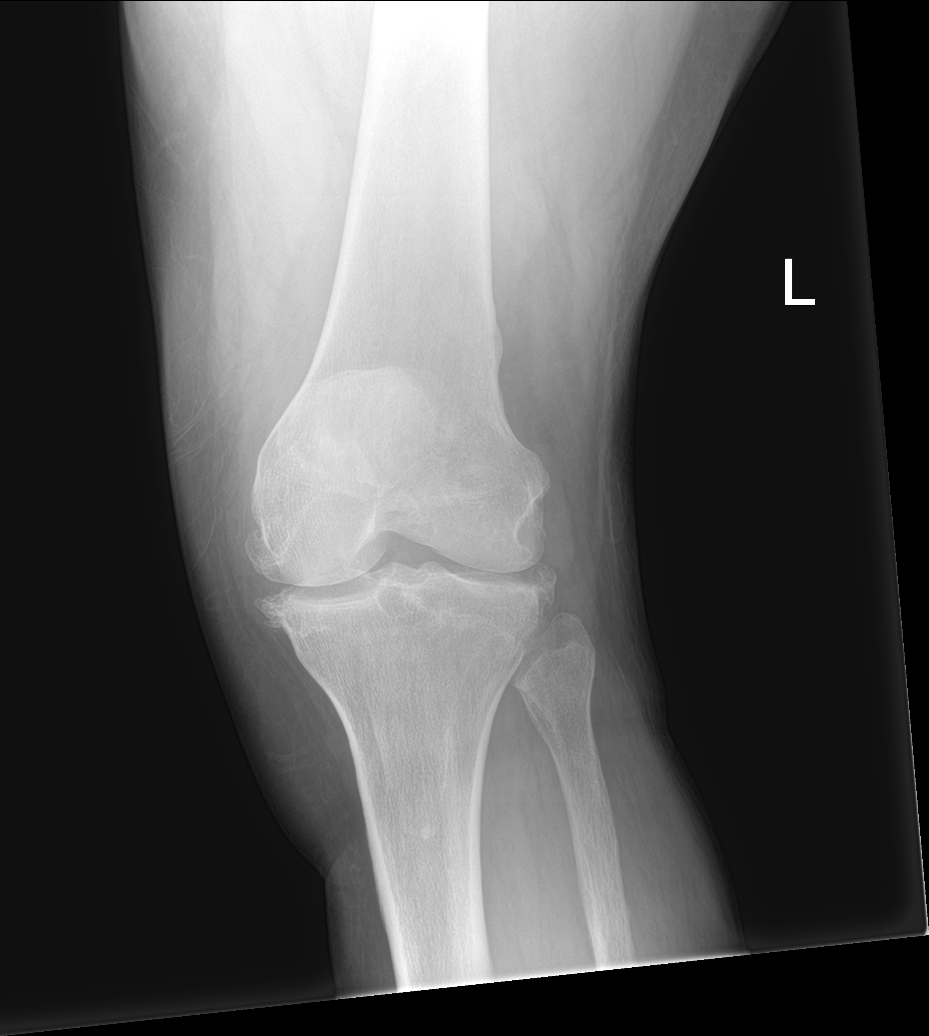

[5 of 5 positions shown; findings below may reference images not displayed]

FINDINGS: Moderate joint effusion. Tricompartmental osteoarthritis with joint
space narrowing and marginal osteophyte formation. No evidence of
fracture. Possible small loose bodies visible on the lateral view.
No focal bone finding otherwise.
IMPRESSION: Tricompartmental osteoarthritis with moderate joint effusion and
possible small loose bodies.

## 2020-08-28 MED ORDER — DICLOFENAC SODIUM 1 % EX GEL
2.0000 g | Freq: Four times a day (QID) | CUTANEOUS | Status: DC
  Administered 2020-08-28 – 2020-09-06 (×36): 2 g via TOPICAL
  Filled 2020-08-28: qty 100

## 2020-08-28 MED ORDER — GLIMEPIRIDE 2 MG PO TABS
1.0000 mg | ORAL_TABLET | Freq: Every day | ORAL | Status: DC
Start: 2020-08-28 — End: 2020-09-03
  Administered 2020-08-28 – 2020-09-03 (×7): 1 mg via ORAL
  Filled 2020-08-28 (×7): qty 1

## 2020-08-28 NOTE — Progress Notes (Signed)
Speech Language Pathology Note  Patient Details  Name: Justin Mason MRN: [id number] Date of Birth: 1981-02-03 Today's Date: 08/28/2020  Pt missed 45 minutes of skilled ST services due to transport for x-ray.    [name]  [facility] 08/28/2020, 3:23 PM

## 2020-08-28 NOTE — Progress Notes (Signed)
Nutrition Follow-up  DOCUMENTATION CODES:   Morbid obesity  INTERVENTION:   - Continue Ensure Enlive po BID, each supplement provides 350 kcal and 20 grams of protein  - Continue 1 packet Juven BID, each packet provides 95 calories, 2.5 grams of protein, and 9.8 grams of carbohydrate; also contains 7 grams of L-arginine and L-glutamine, 300 mg vitamin C, 15 mg vitamin E, 1.2 mcg vitamin B-12, 9.5 mg zinc, 200 mg calcium, and 1.5 g calcium Beta-hydroxy-Beta-methylbutyrate to support wound healing  - Continue MVI with minerals daily  NUTRITION DIAGNOSIS:   Increased nutrient needs related to acute illness,wound healing as evidenced by estimated needs.  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 Past medical history significant for morbid obesity, cellulitis of lower extremity, HTN, GERD, HTN, HLD, OSA, and DM2.  Noted d/c date of 3/4.  Met with pt at bedside. No family present at time of RD visit. Pt communicated with head nods and hand motions. Pt confirms that overall he is eating well. He indicates that his appetite comes and goes. Pt confirms that he is consuming Ensure Enlive and Juven supplements. Will continue with current supplement regimen.  Weight down slightly compared to admit weight but seems to be stabilizing. Will continue to monitor.  CIR admit weight: 159.1 kg Current weight: 155 kg  Meal Completion: 50-100%  Medications reviewed and include: vitamin C 500 mg daily, cholecalciferol, farxiga, Ensure Enlive BID, SSI, metformin, MVI with  minerals, Juven, protonix  Labs reviewed. CBG's: 111-164 x 24 hours  Diet Order:   Diet Order            Diet Carb Modified Fluid consistency: Thin; Room service appropriate? No  Diet effective now                 EDUCATION NEEDS:   Not appropriate for education at this time  Skin:  Skin Assessment: Skin Integrity Issues: DTI: bilateral buttocks Other: MASD to groin  Last BM:  08/28/20 type 4  Height:   Ht Readings from Last 1 Encounters:  08/09/20 _0  (1.854 m)    Weight:   Wt Readings from Last 1 Encounters:  08/26/20 (!) 155 kg    Ideal Body Weight:  83.6 kg  BMI:  Body mass index is 45.08 kg/m.  Estimated Nutritional Needs:   Kcal:  2400-2600  Protein:  130-145  Fluid:  >/= 2.4 L    Gustavus Bryant, MS, RD, LDN Inpatient Clinical Dietitian Please see AMiON for contact information.

## 2020-08-28 NOTE — Progress Notes (Signed)
Patient ID: Justin Mason, male   DOB: Nov 13, 1980, 40 y.o.   MRN: [id number] Met with pt and hanna and spoke with Mom via telephone to discuss team conference still needing plus 2 assist and discharge still 3/4. Expressed concern about still needing two people for transfers and his knee pain has made him require more assist this week. When mentioned extending stay both did not want this. Discussed having Mom come in and do some therapies with Orvil Feil. Scheduled for tomorrow at 10:00. Called front desk to let know Mom is coming in. Will see in am. Therapy team aware also.

## 2020-08-28 NOTE — Patient Care Conference (Signed)
Inpatient RehabilitationTeam Conference and Plan of Care Update Date: 08/28/2020   Time: 11:32 AM    Patient Name: Justin Mason      Medical Record Number: [id number]  Date of Birth: Oct 25, 1980 Sex: Male         Room/Bed: 4W02C/4W02C-01 Payor Info: Payor: [employer] / Plan: CIG[employer] MANAGED / Product Type: *No Product type* /    Admit Date/Time:  08/09/2020  3:32 PM  Primary Diagnosis:  Acute cerebral infarction [name]  Hospital Problems: Principal Problem:   Acute cerebral infarction [facility]) Active Problems:   Pressure injury of skin   Hyponatremia   AKI (acute kidney injury) (HCC)   Transaminitis   Benign essential HTN   Labile blood pressure   Right hemiplegia (HCC)   Acute lower UTI   Right hemiparesis (HCC)   Right elbow pain   Acute blood loss anemia   History of hypertension    Expected Discharge Date: Expected Discharge Date: 09/06/20  Team Members Present: Physician leading conference: Dr. Maryla Morrow Care Coodinator Present: Chana Bode, RN, BSN, CRRN;Becky Dupree, LCSW Nurse Present: Other (comment) Christell Constant, Docia Chuck, RN) PT Present: Sheran Lawless, PT OT Present: Towanda Malkin, OT SLP Present: Colin Benton, SLP PPS Coordinator present : Fae Pippin, L[place]le [place], PT     Current Status/Progress Goal Weekly Team Focus  Bowel/Bladder   Pt incontinent of B/B. LBM 08/26/2020  Regular BMs every 3 days or less. Continent for B/B  Assess B/B every shift and PRN   Swallow/Nutrition/ Hydration   regular textures min-mod A  Supervision  use of swallow strategies   ADL's   Mod A for LB bathing/dressing bed level, mod A UB bathing/dressing at w/c level, mod to max A of 2 SB transfer - limited by air mattress and left knee pain in the past few days  Min A bathing and UB dressing; Mod A LB dressing, toileting and toilet transfers  ADL/transfer training, NMRE, balance, sit to stand, family education   Mobility   bed mobility mod A, slideboard transfers mod A of 1 ranging  to mod A of 2, sit<>stand max A to +2 assist, WC mobility 161ft min A  mod A transfers. Min assist bed mobility  functional mobility/transfers, generalized strengthening, dynamic standing balance/coordination, gait training, improved activity tolerance.   Communication   some phonation noted, primarly aphonic/ apraxia, Mod A imitating voiceless sounds  MIn A  AAC system, voiceless phontation/imitating speech sounds, phonation   Safety/Cognition/ Behavioral Observations  Mod A, appropriate thumbs up/down (yes/no)  Min A  use of AAC,   Pain   Consistently denies pain  Pain scale of <3/10  Continued elevation of right arm and Assess pain every shift and PRN   Skin   Stage II to right buttock  No new breakdown  Assess skin every shift and PRN. Turn pt every 2 hours.     Discharge Planning:  Girlfreind here daily and tries to participate when team feels appropriate. Aware of 24/7 care needed at DC. Will needs to begin family education   Team Discussion: Patient with new left knee pain and right elbow pain addressed. Patient is tearful and functional decline noted with therapy. Some voicing and phonation noted however remains aphonic. Tolerating a regular diet with min cues to clear mouth. Patient increased fluid intake and renal function improved.  MD restarted home DM medications. Patient is incontinent and dependent for toileting.  Patient on target to meet rehab goals: no, currently min assist for wheelchair mobility. Progress  limited by pain. Some movement noted in right upper extremity, requires max assist for upper body care and max assist of 2 for standing  *See Care Plan and progress notes for long and short-term goals.   Revisions to Treatment Plan:  SLP working on phonation, and voicing.  Practicing with Carley Hammed walker for ambulation with max assist +2.  Teaching Needs: Transfers, toileting, medications, etc.   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  Barriers to Discharge: Behavior;Medical stability;Other (comments);Weight;Incontinence  Barriers to Discharge Comments: Expressive aphasia Possible Resolutions to Barriers/Weekly Focus: Therapies, follow labs - Cr, optimize DM/BP meds   Continued Need for Acute Rehabilitation Level of Care: The patient requires daily medical management by a physician with specialized training in physical medicine and rehabilitation for the following reasons: Direction of a multidisciplinary physical rehabilitation program to maximize functional independence : Yes Medical management of patient stability for increased activity during participation in an intensive rehabilitation regime.: Yes Analysis of laboratory values and/or radiology reports with any subsequent need for medication adjustment and/or medical intervention. : Yes   I attest that I was present, lead the team conference, and concur with the assessment and plan of the team.   Chana Bode B 08/28/2020, 3:58 PM

## 2020-08-28 NOTE — Progress Notes (Signed)
Occupational Therapy Session Note  Patient Details  Name: Justin Mason MRN: [id number] Date of Birth: 1981/03/02  Today's Date: 08/28/2020 OT Individual Time: 0900-1000 OT Individual Time Calculation (min): 60 min    Short Term Goals: Week 3:  OT Short Term Goal 1 (Week 3): patient will doff/donn OH shirt with min A OT Short Term Goal 2 (Week 3): Patient will doff/donn pants min A using LRAD and complete clothing management with max A OT Short Term Goal 3 (Week 3): patient will complete SB transfer to/from bed, drop arm commode and w/c with mod A of one  Skilled Therapeutic Interventions/Progress Updates:    Patient in bed, alert and ready for therapy - occ one word responses with encouragement and cues.  Able to indicate pain with gestures/pointing.  No Left knee pain in bed.   Completed lower body bathing and dressing at bed level.  Rolling left mod A, right min A.  Side lying to sitting mod a.  Good seated balance, completed sit to stand with elevated bed to stedy - max A of 2. Returned to sitting on w/c surface - patient notes pain at level "6" in left knee.  Patient became quite tearful at this time - he denies that response is due to pain but indicates that he is frustrated.  Reviewed progress to date and discussed options to keep left leg safe/avoiding injury.  Provided ice pack.  He was able to participate in the rest of session.  UB bathing and dressing completed w/c level with min A for left arm/right arm and mod A OH shirt doff/donn.  Oral care with set up.  He remained seated in w/c at close of session, seat belt alarm set and call bell in hand.    Therapy Documentation Precautions:  Precautions Precautions: Fall Precaution Comments: Significant Right sided hemi; intermittent right sided lean Restrictions Weight Bearing Restrictions: No  Therapy/Group: Individual Therapy  Barrie Lyme 08/28/2020, 7:29 AM

## 2020-08-28 NOTE — Progress Notes (Signed)
[place] PHYSICAL MEDICINE & REHABILITATION PROGRESS NOTE  Subjective/Complaints: Patient seen laying in bed this AM.  He indicates he slept well overnight. He indicates his elbow is fair.   ROS: limited due to language  Objective: Vital Signs: Blood pressure 136/72, pulse 70, temperature 98.7 F (37.1 C), temperature source Oral, resp. rate 18, height 6\' 1"  (1.854 m), weight (!) 155 kg, SpO2 98 %. No results found. Recent Labs    08/26/20 0502  WBC 8.8  HGB 12.0*  HCT 33.8*  PLT 210   Recent Labs    08/26/20 0502  NA 136  K 3.7  CL 99  CO2 27  GLUCOSE 147*  BUN 12  CREATININE 0.90  CALCIUM 9.1    Intake/Output Summary (Last 24 hours) at 08/28/2020 0911 Last data filed at 08/28/2020 0754 Gross per 24 hour  Intake 240 ml  Output --  Net 240 ml     Pressure Injury 08/09/20 Buttocks Left;Right Deep Tissue Pressure Injury - Purple or maroon localized area of discolored intact skin or blood-filled blister due to damage of underlying soft tissue from pressure and/or shear. (Active)  08/09/20 1716  Location: Buttocks  Location Orientation: Left;Right  Staging: Deep Tissue Pressure Injury - Purple or maroon localized area of discolored intact skin or blood-filled blister due to damage of underlying soft tissue from pressure and/or shear.  Wound Description (Comments):   Present on Admission: Yes    Physical Exam: BP 136/72 (BP Location: Left Arm)   Pulse 70   Temp 98.7 F (37.1 C) (Oral)   Resp 18   Ht 6\' 1"  (1.854 m)   Wt (!) 155 kg   SpO2 98%   BMI 45.08 kg/m   Constitutional: No distress . Vital signs reviewed. Morbidly obese.  HENT: Normocephalic.  Atraumatic. Eyes: EOMI. No discharge. Cardiovascular: No JVD.  RRR. Respiratory: Normal effort.  No stridor.  Bilateral clear to auscultation. GI: Non-distended.  BS +. Skin: Warm and dry.  Intact. Psych: Normal mood.  Normal behavior. Musc: Right upper extremity edema, stable Right elbow tenderness,  stable Neuro: Alert Expressive aphasia, unchanged Nonverbal, stable Motor: RUE 0/5 proximal to 0/5 distal, stable RLE: 3 -/5 proximal to distal, stable  Assessment/Plan: 1. Functional deficits which require 3+ hours per day of interdisciplinary therapy in a comprehensive inpatient rehab setting.  Physiatrist is providing close team supervision and 24 hour management of active medical problems listed below.  Physiatrist and rehab team continue to assess barriers to discharge/monitor patient progress toward functional and medical goals   Care Tool:  Bathing    Body parts bathed by patient: Right arm,Chest,Abdomen,Left upper leg,Face,Right upper leg,Right lower leg,Left lower leg,Front perineal area   Body parts bathed by helper: Buttocks,Left arm,Right arm,Right lower leg,Left lower leg     Bathing assist Assist Level: Moderate Assistance - Patient 50 - 74%     Upper Body Dressing/Undressing Upper body dressing   What is the patient wearing?: Pull over shirt    Upper body assist Assist Level: Moderate Assistance - Patient 50 - 74%    Lower Body Dressing/Undressing Lower body dressing      What is the patient wearing?: Pants     Lower body assist Assist for lower body dressing: Moderate Assistance - Patient 50 - 74%     Toileting Toileting    Toileting assist Assist for toileting: Total Assistance - Patient < 25% Assistive Device Comment: Bedrails   Transfers Chair/bed transfer  Transfers assist  Chair/bed transfer activity did  not occur: Safety/medical concerns  Chair/bed transfer assist level: 2 Helpers     Locomotion Ambulation   Ambulation assist   Ambulation activity did not occur: Safety/medical concerns          Walk 10 feet activity   Assist  Walk 10 feet activity did not occur: Safety/medical concerns        Walk 50 feet activity   Assist Walk 50 feet with 2 turns activity did not occur: Safety/medical concerns         Walk  150 feet activity   Assist Walk 150 feet activity did not occur: Safety/medical concerns         Walk 10 feet on uneven surface  activity   Assist Walk 10 feet on uneven surfaces activity did not occur: Safety/medical concerns         Wheelchair     Assist Will patient use wheelchair at discharge?: Yes Type of Wheelchair: Manual    Wheelchair assist level: Minimal Assistance - Patient > 75% Max wheelchair distance: 100    Wheelchair 50 feet with 2 turns activity    Assist        Assist Level: Minimal Assistance - Patient > 75%   Wheelchair 150 feet activity     Assist      Assist Level: Moderate Assistance - Patient 50 - 74%   Medical Problem List and Plan: 1.  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  Continue CIR  WHO/PRAFO nightly--elevate/support RUE while in bed/chair  Team conference today to discuss current and goals and coordination of care, home and environmental barriers, and discharge planning with nursing, case manager, and therapies. Please see conference note from today as well.  2.  Antithrombotics: -DVT/anticoagulation:  Pharmaceutical: Continue Lovenox  Dopplers negative for DVT             -antiplatelet therapy: DAPT X 3 months followed by ASA alone (changed to 81)--Plavix added on 08/09/20 3. Pain Management: Mobic DC'd   Right elbow pain:  CT performed and really unremarkable except for chronic anconeus tendinosis  Trial of gabapentin 100mg  tid  Voltaren gel started on 2/23 4. Mood: LCSW to follow for evaluation and support.              -antipsychotic agents: N/A 5. Neuropsych: This patient is not fully capable of making decisions on his own behalf. 6. Skin/Wound Care: Routine pressure relief measures.   Santyl to intergluteal ulcer 7. Fluids/Electrolytes/Nutrition: Monitor I/Os.  8. HTN: Monitor BP tid    HCTZ daily, DC'd on 2/7  Avapro decreased to 150 on 2/8  Catapres twice daily, Lopressor bid.   Relatively controlled on 2/23               9. T2DM with hyperglycemia: Hgb A1c-6.2.   CBG (last 3)  Recent Labs    08/27/20 1707 08/27/20 2125 08/28/20 0631  GLUCAP 115* 142* 164*      Metformin 1000 mg bid  Farxiga restarted  Amaryl restarted on 2/23 10. Covid 19--Positive  on 07/24/20-->no treatment as asymptomatic. Continue zinc and vitamin C.  11. Morbid obesity: BMI 46. Educate on appropriate diet and exercise to help promote health and mobility.              Encourage weight loss 12.  Hyperlipidemia: Crestor 13. H/o gout: continue allopurinol.  14. Hyponatremia  Na+ 136 on 2/21   15.  AKI:   Cr.  0.90 on 2/21  Received IVF x3 nights ended on 2/11   Echo: EF 60-65%  Continue to encourage fluids 16. Elevated liver enzymes: Resolved  ALT within normal limits on 2/11 17. Mild leukocytosis: Resolved  WBCs 8.2 on 2/14  Encourage IS as possible, OOB  See #18 18.  Acute lower UTI  UA +, urine culture showing E. coli  Completed course of Macrobid  20.  Acute blood loss anemia  Hemoglobin 12.0 on 2/21  LOS: 19 days A FACE TO FACE EVALUATION WAS PERFORMED  Justin Mason 08/28/2020, 9:11 AM

## 2020-08-28 NOTE — Evaluation (Signed)
Recreational Therapy Assessment and Plan  Patient Details  Name: Justin Mason MRN: [id number] Date of Birth: May 31, 1981 Today's Date: 08/28/2020  Rehab Potential:   ELOS:   d/c 09/06/20 Assessment Hospital Problem: Principal Problem:   Acute cerebral infarction [facility]) Active Problems:   Pressure injury of skin   Hyponatremia   AKI (acute kidney injury) ([place])   Transaminitis   Benign essential HTN   Past Medical History:      Past Medical History:  Diagnosis Date  . AKI (acute kidney injury) ([place])   . Arthritis   . Cardiac murmur 05/24/2020  . Cellulitis 01/03/2015  . Cellulitis of right leg   . Cellulitis of right lower extremity 01/03/2015  . Essential hypertension   . GERD (gastroesophageal reflux disease)   . High cholesterol   . Hyperlipemia   . Hypertension   . Morbid obesity ([place]) 05/24/2020  . OSA (obstructive sleep apnea)   . Rotator cuff tear, right 09/08/2011  . Sepsis ([place]) 01/03/2015  . Stroke ([place])   . Type 2 diabetes mellitus with hyperglycemia [facility])    Past Surgical History:       Past Surgical History:  Procedure Laterality Date  . INCISION AND DRAINAGE FOOT  1991-1992   STEPPED ON NAIL / INFECTED WOUND  . IR ANGIO INTRA EXTRACRAN SEL COM CAROTID INNOMINATE BILAT MOD SED  07/24/2020  . IR ANGIO VERTEBRAL SEL SUBCLAVIAN INNOMINATE UNI L MOD SED  07/24/2020  . IR ANGIO VERTEBRAL SEL VERTEBRAL UNI R MOD SED  07/24/2020  . RADIOLOGY WITH ANESTHESIA N/A 07/24/2020   Procedure: IR WITH ANESTHESIA - CODE STROKE;  Surgeon: Radiologist, Medication, MD;  Location: [place];  Service: Radiology;  Laterality: N/A;  . SHOULDER SURGERY     September 08, 2011    Assessment & Plan Clinical Impression: Patient is a 40 year old male with history of HTN--poorly controlled (Dr. Geraldo Pitter), Morbid obesity, T2DM, OSA, diagnosis of Covid 19 less than one week prior to admission on 07/24/2020 with dysarthria, right hemiparesis, progressing to right facial droop  with inability to speak. History taken from chart review and girlfriend due to aphasia. 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 CT perfusion showed large area of hypoperfusion without core infarct. CT angioplasty of MC attempted without success due to significant resistance and suspected to be chronic. He tolerated extubation without difficulty and respiratory status has been stable--therefore no indications to treat Covid. Echocardiogram with ejection fraction of 60-65%. Dr. Leonie Man recommended DAPT x3 months followed by aspirin alone. Bedside swallow showed mild oropharyngeal dysphagia and patient placed on D3 thin liquid diet. 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  Patient transferred to CIR on 08/09/2020.    Pt presents with decreased activity tolerance, decreased functional mobility, decreased balance, decreased coordination,decreased vision, decreased attention, decreased problem solving, decreased safety awareness, expressive aphasia Limiting pt's independence with leisure/community pursuits.  Met with pt today to discuss TR services.  Pt limited by aphasia but did gesture with thumbs up/down when questioned.  Leisure interest form left in the room for family completion.      Plan  Min 1 TR session/family education prior to dicharge  Recommendations for other services: Neuropsych  Discharge Criteria: Patient will be discharged from TR if patient refuses treatment 3 consecutive times without medical reason.  If treatment goals not met, if there is a change in medical status, if patient makes  no progress towards goals or if patient is discharged from hospital.  The above assessment, treatment plan, treatment alternatives and goals were discussed and mutually agreed upon: by patient  Casey 08/28/2020, 8:41 AM

## 2020-08-28 NOTE — Progress Notes (Signed)
Physical Therapy Session Note  Patient Details  Name: Justin Mason MRN: [id number] Date of Birth: [birth year]  Today's Date: 08/28/2020 PT Individual Time: 4431-5400 PT Individual Time Calculation (min): 71 min   Short Term Goals: Week 3:  PT Short Term Goal 1 (Week 3): Pt will transfer bed<>WC with LRAD and min A of 1 PT Short Term Goal 2 (Week 3): pt will initiate gait training PT Short Term Goal 3 (Week 3): Pt will transfer sit<>stand with max of 1 consistantly  Skilled Therapeutic Interventions/Progress Updates:    Patient in w/c with girlfriend present.  She reports just spoke with SW about participating in therapies and willing to do so during today's session.  Patient pushed in w/c to therapy gym.  Noted L knee with edema along lateral aspect of thigh and hard area over lateral patellar area.  MD made aware and came to visualize and ordered x-ray.  Performed w/c set up educating Justin Mason, pt's girlfriend, how to set up for transfer.  Performed slide board transfer to L with +2 max A educating Justin Mason about technique for board placement, unweighting hips and having assist to scoot.  Justin Mason set up w/c and assisted for slide board transfer back to w/c with +2 mod A going to R.  Patient back to mat with Justin Mason assisting to L with +2 max A.  Patient seated for lateral, post and anterior trunk leans.  Performed sit to sidelying on L with mod A.  Placed powder board under R and pt with cues and facilitation performed hip and knee flexion/extension.  Rolled to supine with CGA.  Patient performed bridging x 10, then bridging with ball between knees x 10 then single leg bridge on R x 10.  Supine to L sidelying with mod A.  Performed side to sit with mod A for trunk and max cues with facilitation for R leg off bed.  Patient transferred to w/c with Justin Mason leading and PT assist to L with +2 max A.  Pushed in w/c to room and left up with Justin Mason in the room.    Therapy Documentation Precautions:   Precautions Precautions: Fall Precaution Comments: Significant Right sided hemi; intermittent right sided lean Restrictions Weight Bearing Restrictions: No Pain: Pain Assessment Pain Scale: Faces Pain Score: 0-No pain Faces Pain Scale: Hurts little more Pain Type: Acute pain Pain Location: Knee Pain Orientation: Left Pain Descriptors / Indicators: Grimacing;Guarding Pain Onset: On-going Pain Intervention(s): Repositioned   Therapy/Group: Individual Therapy  Elray Mcgregor  Sheran Lawless,  08/28/2020, 5:45 PM

## 2020-08-29 ENCOUNTER — Inpatient Hospital Stay (HOSPITAL_COMMUNITY): Payer: Managed Care, Other (non HMO)

## 2020-08-29 DIAGNOSIS — M25561 Pain in right knee: Secondary | ICD-10-CM

## 2020-08-29 DIAGNOSIS — G8929 Other chronic pain: Secondary | ICD-10-CM

## 2020-08-29 LAB — GLUCOSE, CAPILLARY
Glucose-Capillary: 128 mg/dL — ABNORMAL HIGH (ref 70–99)
Glucose-Capillary: 135 mg/dL — ABNORMAL HIGH (ref 70–99)
Glucose-Capillary: 109 mg/dL — ABNORMAL HIGH (ref 70–99)
Glucose-Capillary: 111 mg/dL — ABNORMAL HIGH (ref 70–99)

## 2020-08-29 IMAGING — MR MR KNEE*L* WO/W CM
4 of 9 series · 11 of 40 positions shown · IV contrast (10 ML GAD)
Comparison: Plain films left knee [DATE].

CLINICAL DATA: Chronic left knee pain and instability. No known
injury.

EXAM:
MRI OF THE LEFT KNEE WITHOUT AND WITH CONTRAST
TECHNIQUE: Multiplanar, multisequence MR imaging of the knee was performed
before and after the administration of intravenous contrast.
CONTRAST:  10 mL GADAVIST IV SOLN

[Series 4: T2 fat-sat · axial · 4.0mm · 0.31mm/px · z∈[-98,+87]mm · 3 of 38 slices shown (1 of 2)]
[im 1/38]
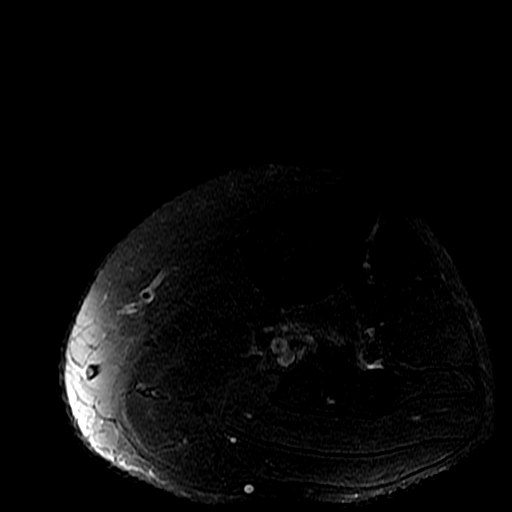
[im 25/38]
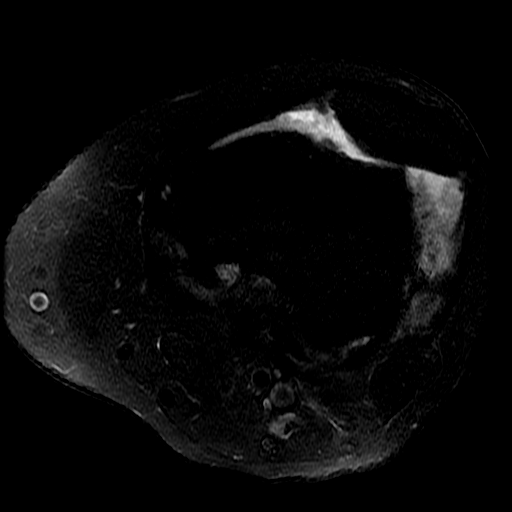
[im 38/38]
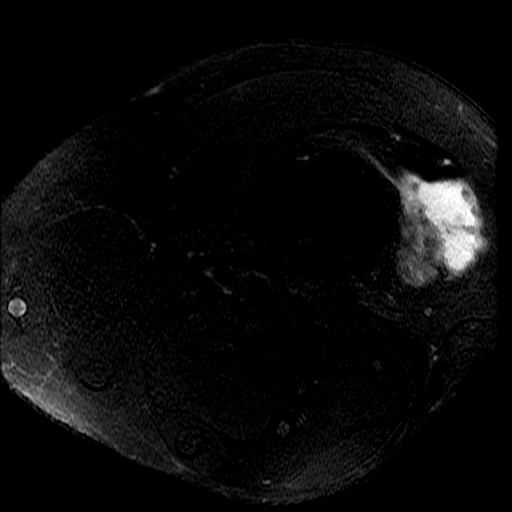

[Series 6: PD fat-sat · coronal · 4.0mm · 0.16mm/px · 3 of 29 slices shown (1 of 2)]
[im 1/29]
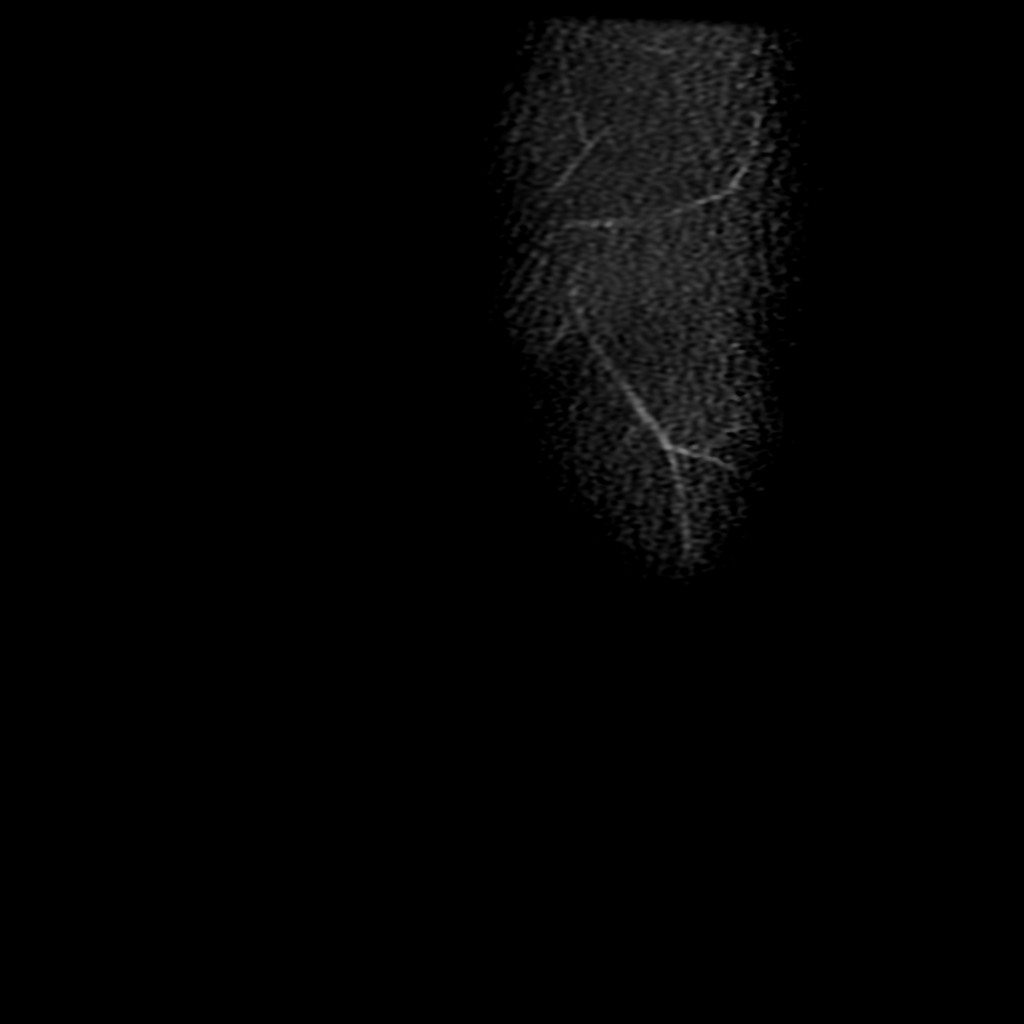
[im 19/29]
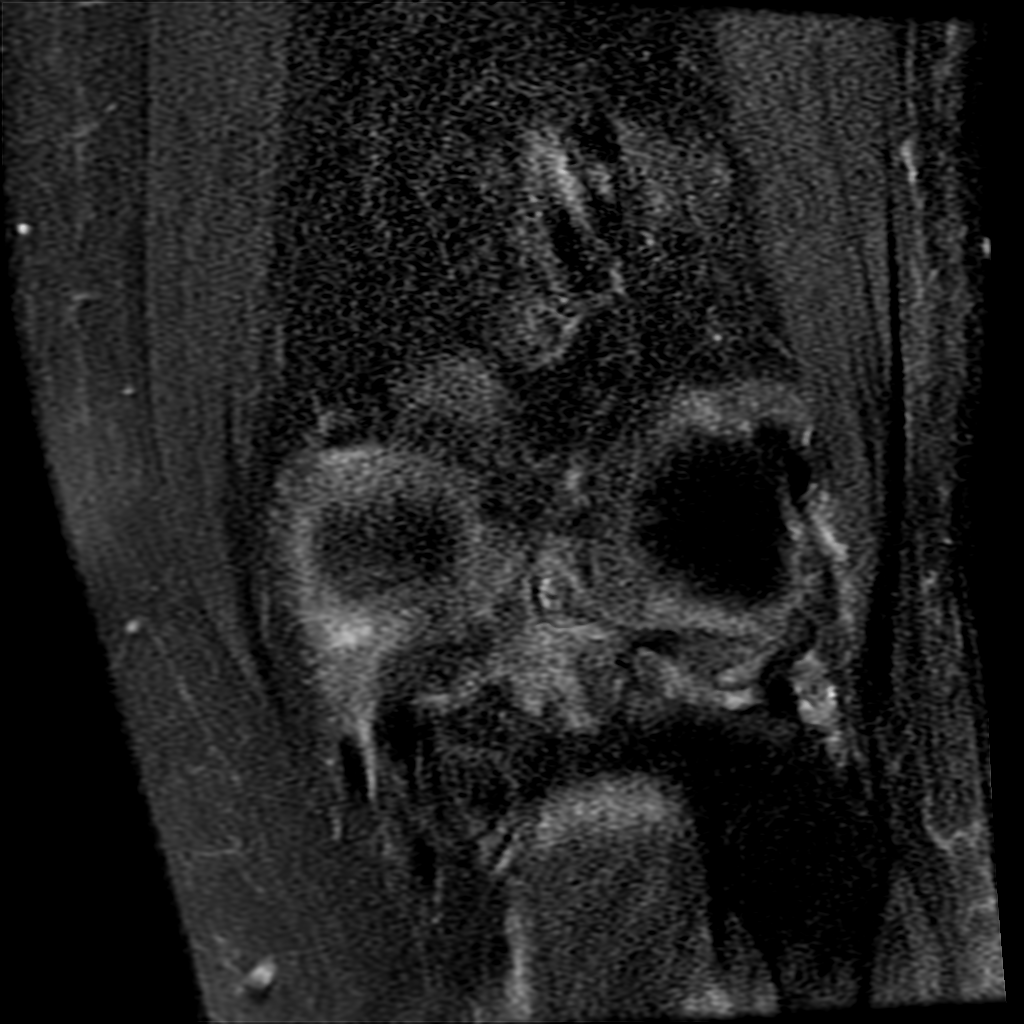
[im 29/29]
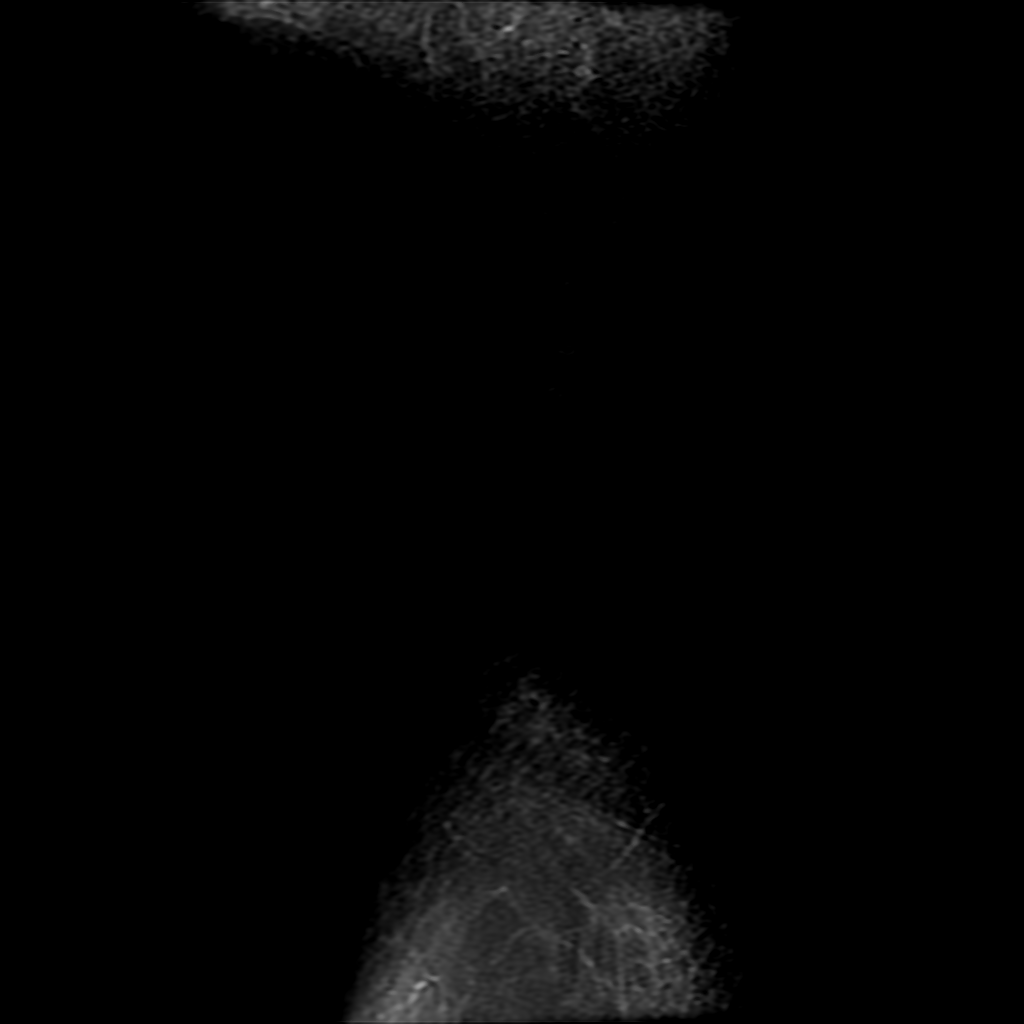

[Series 7: PD fat-sat · sagittal · 3.0mm · 0.29mm/px · 3 of 42 slices shown (2 of 2)]
[im 1/42]
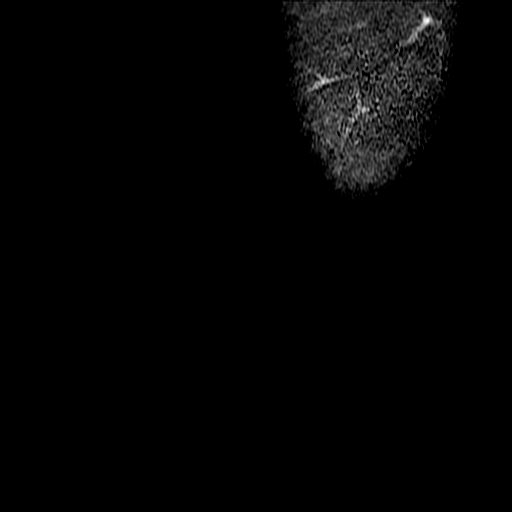
[im 21/42]
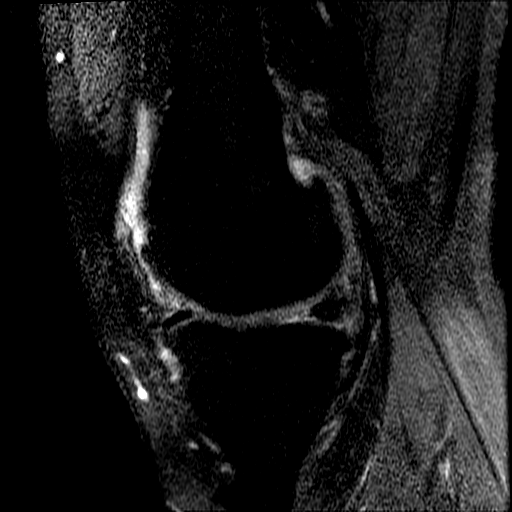
[im 42/42]
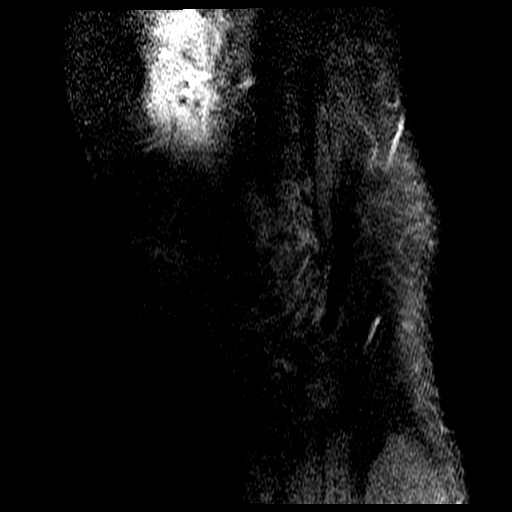

[Series 8: T2 fat-sat · sagittal · 3.0mm · 0.29mm/px · 2 of 42 slices shown (2 of 2)]
[im 1/42]
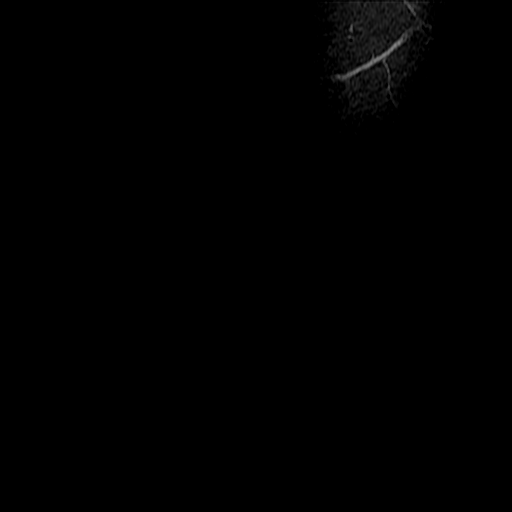
[im 21/42]
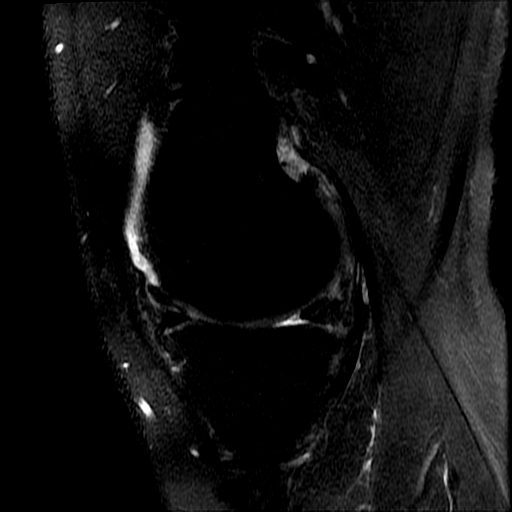

[11 of 40 positions shown; findings below may reference images not displayed]

FINDINGS: MENISCI

Medial meniscus: There is a radial tear through the root of the
posterior horn.

Lateral meniscus: Severe complex tearing is seen in the posterior
horn. The majority of the anterior horn is not visualized consistent
with degenerative maceration.

LIGAMENTS

Cruciates:  Chronic, complete ACL tear.  The PCL is intact.

Collaterals:  Intact.

CARTILAGE

Patellofemoral:  Severely degenerated.

Medial:  Severely degenerated.

Lateral:  Severely degenerated.

Joint: Small joint effusion. Frondlike proliferation of synovial fat
is consistent with lipoma arborescens.

Popliteal Fossa:  Very small Baker's cyst.

Extensor Mechanism:  Intact.

Bones:  Bulky tricompartmental osteophytosis.

Other: None.
IMPRESSION: Dominant finding is severe tricompartmental osteoarthritis.

Chronic, complete ACL tear.

Tearing of both the medial and lateral menisci as described above.

Lipoma arborescens.

## 2020-08-29 MED ORDER — GADOBUTROL 1 MMOL/ML IV SOLN
10.0000 mL | Freq: Once | INTRAVENOUS | Status: AC | PRN
  Administered 2020-08-29: 10 mL via INTRAVENOUS

## 2020-08-29 NOTE — Progress Notes (Signed)
[place] PHYSICAL MEDICINE & REHABILITATION PROGRESS NOTE  Subjective/Complaints: Patient seen laying in bed this AM.  He indicates he slept well overnight.  He indicates improvement in elbow and knee pain with Voltaren gel.  Discussed knee pain and patellar tracking with therapies.  ROS: limited due to language  Objective: Vital Signs: Blood pressure 122/69, pulse (!) 59, temperature 98.6 F (37 C), resp. rate 20, height 6\' 1"  (1.854 m), weight (!) 155 kg, SpO2 97 %. DG Knee Complete 4 Views Left  Result Date: 08/28/2020 CLINICAL DATA:  Knee pain EXAM: LEFT KNEE - COMPLETE 4+ VIEW COMPARISON:  None. FINDINGS: Moderate joint effusion. Tricompartmental osteoarthritis with joint space narrowing and marginal osteophyte formation. No evidence of fracture. Possible small loose bodies visible on the lateral view. No focal bone finding otherwise. IMPRESSION: Tricompartmental osteoarthritis with moderate joint effusion and possible small loose bodies. Electronically Signed   By: 08/30/2020 M.D.   On: 08/28/2020 18:29   No results for input(s): WBC, HGB, HCT, PLT in the last 72 hours. No results for input(s): NA, K, CL, CO2, GLUCOSE, BUN, CREATININE, CALCIUM in the last 72 hours.  Intake/Output Summary (Last 24 hours) at 08/29/2020 1231 Last data filed at 08/28/2020 1827 Gross per 24 hour  Intake 399 ml  Output --  Net 399 ml     Pressure Injury 08/09/20 Buttocks Left;Right Deep Tissue Pressure Injury - Purple or maroon localized area of discolored intact skin or blood-filled blister due to damage of underlying soft tissue from pressure and/or shear. (Active)  08/09/20 1716  Location: Buttocks  Location Orientation: Left;Right  Staging: Deep Tissue Pressure Injury - Purple or maroon localized area of discolored intact skin or blood-filled blister due to damage of underlying soft tissue from pressure and/or shear.  Wound Description (Comments):   Present on Admission: Yes    Physical  Exam: BP 122/69 (BP Location: Left Arm)   Pulse (!) 59   Temp 98.6 F (37 C)   Resp 20   Ht 6\' 1"  (1.854 m)   Wt (!) 155 kg   SpO2 97%   BMI 45.08 kg/m   Constitutional: No distress . Vital signs reviewed.  Morbidly obese. HENT: Normocephalic.  Atraumatic. Eyes: EOMI. No discharge. Cardiovascular: No JVD.  RRR. Respiratory: Normal effort.  No stridor.  Bilateral clear to auscultation. GI: Non-distended.  BS +. Skin: Warm and dry.  Intact. Psych: Normal mood.  Normal behavior. Musc: Right upper extremity edema, unchanged Right elbow tenderness, unchanged Left knee TTP Neuro: Alert Expressive aphasia, unchanged Nonverbal, stable Motor: RUE 0/5 proximal to 0/5 distal, unchanged RLE: 3 -/5 proximal to distal, stable  Assessment/Plan: 1. Functional deficits which require 3+ hours per day of interdisciplinary therapy in a comprehensive inpatient rehab setting.  Physiatrist is providing close team supervision and 24 hour management of active medical problems listed below.  Physiatrist and rehab team continue to assess barriers to discharge/monitor patient progress toward functional and medical goals   Care Tool:  Bathing    Body parts bathed by patient: Right arm,Chest,Abdomen,Left upper leg,Face,Right upper leg,Right lower leg,Left lower leg,Front perineal area   Body parts bathed by helper: Buttocks,Left arm,Right arm,Right lower leg,Left lower leg     Bathing assist Assist Level: Moderate Assistance - Patient 50 - 74%     Upper Body Dressing/Undressing Upper body dressing   What is the patient wearing?: Pull over shirt    Upper body assist Assist Level: Moderate Assistance - Patient 50 - 74%  Lower Body Dressing/Undressing Lower body dressing      What is the patient wearing?: Pants     Lower body assist Assist for lower body dressing: Moderate Assistance - Patient 50 - 74%     Toileting Toileting    Toileting assist Assist for toileting: Total  Assistance - Patient < 25% Assistive Device Comment: Bedrails   Transfers Chair/bed transfer  Transfers assist  Chair/bed transfer activity did not occur: Safety/medical concerns  Chair/bed transfer assist level: 2 Helpers     Locomotion Ambulation   Ambulation assist   Ambulation activity did not occur: Safety/medical concerns          Walk 10 feet activity   Assist  Walk 10 feet activity did not occur: Safety/medical concerns        Walk 50 feet activity   Assist Walk 50 feet with 2 turns activity did not occur: Safety/medical concerns         Walk 150 feet activity   Assist Walk 150 feet activity did not occur: Safety/medical concerns         Walk 10 feet on uneven surface  activity   Assist Walk 10 feet on uneven surfaces activity did not occur: Safety/medical concerns         Wheelchair     Assist Will patient use wheelchair at discharge?: Yes Type of Wheelchair: Manual    Wheelchair assist level: Minimal Assistance - Patient > 75% Max wheelchair distance: 100    Wheelchair 50 feet with 2 turns activity    Assist        Assist Level: Minimal Assistance - Patient > 75%   Wheelchair 150 feet activity     Assist      Assist Level: Moderate Assistance - Patient 50 - 74%   Medical Problem List and Plan: 1.  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  Continue CIR  WHO/PRAFO nightly--elevate/support RUE while in bed/chair 2.  Antithrombotics: -DVT/anticoagulation:  Pharmaceutical: Continue Lovenox  Dopplers negative for DVT             -antiplatelet therapy: DAPT X 3 months followed by ASA alone (changed to 81)--Plavix added on 08/09/20 3. Pain Management: Mobic DC'd   Right elbow pain:  CT performed and really unremarkable except for chronic anconeus tendinosis  Trial of gabapentin 100mg   tid  Voltaren gel started on 2/23  Appears to be improving on 2/24 4. Mood: LCSW to follow for evaluation and support.              -antipsychotic agents: N/A 5. Neuropsych: This patient is not fully capable of making decisions on his own behalf. 6. Skin/Wound Care: Routine pressure relief measures.   Santyl to intergluteal ulcer 7. Fluids/Electrolytes/Nutrition: Monitor I/Os.  8. HTN: Monitor BP tid   HCTZ daily, DC'd on 2/7  Avapro decreased to 150 on 2/8  Catapres twice daily, Lopressor bid.   Relatively controlled on 2/24               9. T2DM with hyperglycemia: Hgb A1c-6.2.   CBG (last 3)  Recent Labs    08/28/20 2201 08/29/20 0609 08/29/20 1220  GLUCAP 148* 128* 135*      Metformin 1000 mg bid  Farxiga restarted  Amaryl restarted on 2/23  Improving on 2/24 10. Covid 19--Positive  on 07/24/20-->no treatment as asymptomatic. Continue zinc and vitamin C.  11. Morbid obesity: BMI  46. Educate on appropriate diet and exercise to help promote health and mobility.              Encourage weight loss 12.  Hyperlipidemia: Crestor 13. H/o gout: continue allopurinol.  14. Hyponatremia  Na+ 136 on 2/21   15. AKI:   Cr.  0.90 on 2/21  Received IVF x3 nights ended on 2/11   Echo: EF 60-65%  Continue to encourage fluids 16. Elevated liver enzymes: Resolved  ALT within normal limits on 2/11 17. Mild leukocytosis: Resolved  WBCs 8.2 on 2/14  Encourage IS as possible, OOB  See #18 18.  Acute lower UTI  UA +, urine culture showing E. coli  Completed course of Macrobid  20.  Acute blood loss anemia  Hemoglobin 12.0 on 2/21 21.  Left knee pain - acute on chronic  Xray showing OA with ?loose bodies  MRI ordered  See #3  LOS: 20 days A FACE TO FACE EVALUATION WAS PERFORMED  Sheniya Garciaperez Karis Juba 08/29/2020, 12:31 PM

## 2020-08-29 NOTE — Progress Notes (Signed)
Occupational Therapy Session Note  Patient Details  Name: Justin Mason MRN: [id number] Date of Birth: 17-Feb-1981  Today's Date: 08/29/2020 OT Individual Time: 1105-1210 OT Individual Time Calculation (min): 65 min    Short Term Goals: Week 3:  OT Short Term Goal 1 (Week 3): patient will doff/donn OH shirt with min A OT Short Term Goal 2 (Week 3): Patient will doff/donn pants min A using LRAD and complete clothing management with max A OT Short Term Goal 3 (Week 3): patient will complete SB transfer to/from bed, drop arm commode and w/c with mod A of one  Skilled Therapeutic Interventions/Progress Updates:    Met with pt, girl friend and mom for OT education.  Handoff from PT who had just worked on [employer] transfers with pt and family.  Education: 1.  Facilitation of RUE with a/arom, tapping and hand over hand guiding with table top slides on pillow case, hand over hand grasping of cups/cones, and guiding hand with washing R thigh and L forearm. Demonstration with return demonstration.   2. ADL verbal education as family observed PT dressing pt from bed.   Clarified pt will need to do all LB self care from bed using rolling for washing and donning pants with max to total A from caregiver.   It is not safe to do toileting at this time. But did request SW to order a wide drop arm BSC.   Informed pt and family that once he can reach a mod A level of 1 with board, then could initiate board transfers to [facility]. Demonstration with long sponge of how to wash LUE with L hand. Discussed UB dressing technique of R arm, head, fixing the shoulder and then the L arm to avoid the R shoulder from getting "stuck" in the shirt.    3. Introduction of hoyer lift.  Had girlfriend practice using level to lift hoyer arm and the valve to release it slowly before we practiced it.  Placed sling around pt demonstrating how low to put it on the hips and crossing the leg loops.  His girlfriend practiced lifting him  out of the seat with his mom ensuring his arms were not getting caught. Pt has to cross his arms very tightly, using L to support R arm.  The loops were not placed at enough of a different length so pt was lifted up in sling in a horizontal position.  To get him back into wc, needed A from other therapists to pull his back into wc, while this therapist pushed him back from his legs. His girlfriend released valve to lower him.   Next time, will need to put back loops on ORANGE  Leg loops on BLACK.   Family will need continued practice and training. Family took patient back to his room.    Therapy Documentation Precautions:  Precautions Precautions: Fall Precaution Comments: Significant Right sided hemi; intermittent right sided lean Restrictions Weight Bearing Restrictions: No  Pain: facial grimacing with weight through left knee   ADL: ADL Upper Body Bathing: Moderate assistance Where Assessed-Upper Body Bathing: Edge of bed Lower Body Bathing: Maximal assistance Where Assessed-Lower Body Bathing: Edge of bed,Bed level Upper Body Dressing: Other (Comment) (+2) Where Assessed-Upper Body Dressing: Edge of bed Lower Body Dressing: Dependent Where Assessed-Lower Body Dressing: Bed level Toileting: Dependent,Other (Comment) (+2) Where Assessed-Toileting: Bed level   Therapy/Group: Individual Therapy  [place] 08/29/2020, 12:28 PM

## 2020-08-29 NOTE — Plan of Care (Signed)
  Problem: RH BLADDER ELIMINATION Goal: RH STG MANAGE BLADDER WITH ASSISTANCE Description: STG Manage Bladder With min Assistance Outcome: Not Progressing;incontinence   

## 2020-08-29 NOTE — Progress Notes (Signed)
Speech Language Pathology Daily Session Note  Patient Details  Name: Justin Mason MRN: [id number] Date of Birth: Aug 10, 1980  Today's Date: 08/29/2020 SLP Individual Time: 1300-1400 SLP Individual Time Calculation (min): 60 min  Short Term Goals: Week 3: SLP Short Term Goal 1 (Week 3): Patient will coordinate breath support during production of voiceless phonemes /h/, /p/, etc. with modA cues. SLP Short Term Goal 2 (Week 3): Patient will make requests using communication board/AAC device with minA after setup A. SLP Short Term Goal 3 (Week 3): Patient will produce CVCV (consonant-vowel-consonant-vowel) words (reduplicated: baba, papa, etc) with modA cues . SLP Short Term Goal 4 (Week 3): Patient will imitate to mouth one-syllable words with maxA. SLP Short Term Goal 5 (Week 3): Patient will manage right sided buccal pocketing of regular solids with intermittentA cues  Skilled Therapeutic Interventions:   Patient seen for skilled ST session with girlfriend and mother present. SLP spent portion of session educating Mom and girlfriend on patient's progress, strategies and exercises and demonstrating AAC device use. Patient was much more animated and non-verbally expressive today during session. He demonstrated some improved frequency of error correction with coordinating breath with mouthing of words. He was able to name 4 common objects with whisper-like vocal quality but with accurate oral motor movements. He exhibited perseveration on words but with awareness to majority of instances however unable to correct without SLP cues. He described at two-word phrase level for verb photo cards, "dribbling ball", "drinking juice" but with articulation errors. He achieved several instances of voicing at phoneme level but still inconsistent. Patient continues to demonstrate progress and continues to benefit from skilled SLP intervention to maximize speech-language, cognitive and swallow function prior to  discharge.  Pain Pain Assessment Pain Scale: 0-10 Pain Score: 0-No pain Faces Pain Scale: Hurts little more Pain Type: Acute pain Pain Location: Knee Pain Orientation: Left Pain Descriptors / Indicators: Sharp Pain Onset: On-going Pain Intervention(s): Therapeutic touch;Repositioned  Therapy/Group: Individual Therapy  Angela Nevin, MA, CCC-SLP Speech Therapy

## 2020-08-29 NOTE — Progress Notes (Signed)
Physical Therapy Session Note  Patient Details  Name: TRAY KLAYMAN MRN: [id number] Date of Birth: 1981-01-26  Today's Date: 08/29/2020 PT Individual Time: 1006-1110 PT Individual Time Calculation (min): 64 min   Short Term Goals: Week 3:  PT Short Term Goal 1 (Week 3): Pt will transfer bed<>WC with LRAD and min A of 1 PT Short Term Goal 2 (Week 3): pt will initiate gait training PT Short Term Goal 3 (Week 3): Pt will transfer sit<>stand with max of 1 consistantly  Skilled Therapeutic Interventions/Progress Updates:    Patient in supine and soiled.  Assisted to roll with mod A to R and dependent for hygiene and brief change and max A to don shorts in supine.  While completing hygiene patient's mother and girlfriend, [name], arrived for family ed.  Patient supine to sit with mod A from R sidelying.  Performed seated washing for chest, face and R arm with S, assisted for back and arm pits.  Mod A to don shirt and total A for TEDS and shoes.  Educated mother on self care skills normally practiced during OT, but performed with PT today.  Possible pt can help more, but will see in OT session.  Patient performed slide board transfer with +2 mod A from higher bed surface to w/c.  Initiated education with mom on how to assist.  Patient in w/c pushed to therapy gym.  [name] performed slide board transfer to R side w/c to mat with PT assist for safety +2 max A.  Mother present and observing PT's level of assist with [name] leading.  Performed slide board transfer back to w/c with max A +2 [name] leading, but PT providing demonstration for proper body mechanics and correcting [name] on spinal position.   Patient seated EOM and obtained hoyer lift to initiate education with family on how the lift works and how to sling is attatched, Catering manager.  Patient handoff to OT with family present in gym.  Therapy Documentation Precautions:  Precautions Precautions: Fall Precaution Comments: Significant Right sided hemi;  intermittent right sided lean Restrictions Weight Bearing Restrictions: No Pain: Pain Assessment Pain Scale: Faces Faces Pain Scale: Hurts little more Pain Type: Acute pain Pain Location: Knee Pain Orientation: Left Pain Descriptors / Indicators: Sharp Pain Onset: On-going Pain Intervention(s): Therapeutic touch;Repositioned    Therapy/Group: Individual Therapy  Elray Mcgregor  [place], [place] 08/29/2020, 4:30 PM

## 2020-08-29 NOTE — Progress Notes (Addendum)
Patient ID: Justin Mason, male   DOB: 06/19/1981, 39 y.o.   MRN: [id number]  Met with pt, Mom and girlfriend to see how Mom being here today went. She will need to come in next week. Have scheduled next week-Tuesday, Wednesday and Thursday from 10-2:00 for additional training in preparation for discharge 3/4.  2;14 PM message left for jessica[employer] CM to update. Have started home health with [employer]-faxed info will find home health agency to provide service.  3:15 PM have begun the DME order for hospital bed, hoyer lift, wheelchair and wide drop-arm bedside commode  

## 2020-08-30 DIAGNOSIS — R4701 Aphasia: Secondary | ICD-10-CM

## 2020-08-30 LAB — GLUCOSE, CAPILLARY
Glucose-Capillary: 133 mg/dL — ABNORMAL HIGH (ref 70–99)
Glucose-Capillary: 129 mg/dL — ABNORMAL HIGH (ref 70–99)
Glucose-Capillary: 141 mg/dL — ABNORMAL HIGH (ref 70–99)
Glucose-Capillary: 123 mg/dL — ABNORMAL HIGH (ref 70–99)

## 2020-08-30 NOTE — Plan of Care (Signed)
  Problem: RH Balance Goal: LTG Patient will maintain dynamic standing balance (PT) Description: LTG:  Patient will maintain dynamic standing balance with assistance during mobility activities (PT) Flowsheets (Taken 08/30/2020 1704) LTG: Pt will maintain dynamic standing balance during mobility activities with:: 2 Helpers Note: Downgraded due to limited progress   Problem: Sit to Stand Goal: LTG:  Patient will perform sit to stand with assistance level (PT) Description: LTG:  Patient will perform sit to stand with assistance level (PT) Flowsheets (Taken 08/30/2020 1704) LTG: PT will perform sit to stand in preparation for functional mobility with assistance level: 2 Helpers Note: Downgraded due to pain L knee and limited progress   Problem: RH Bed to Chair Transfers Goal: LTG Patient will perform bed/chair transfers w/assist (PT) Description: LTG: Patient will perform bed to chair transfers with assistance (PT). Flowsheets (Taken 08/30/2020 1704) LTG: Pt will perform Bed to Chair Transfers with assistance level: Moderate Assistance - Patient 50 - 74% Note: Downgraded due to limited progress   Problem: RH Car Transfers Goal: LTG Patient will perform car transfers with assist (PT) Description: LTG: Patient will perform car transfers with assistance (PT). Flowsheets (Taken 08/30/2020 1704) LTG: Pt will perform car transfers with assist:: 2 Helpers Note: Downgraded due to limited progress   Problem: RH Ambulation Goal: LTG Patient will ambulate in controlled environment (PT) Description: LTG: Patient will ambulate in a controlled environment, # of feet with assistance (PT). Outcome: Not Applicable Note: Not safe for ambulation with painful L knee   Problem: RH Ambulation Goal: LTG Patient will ambulate in home environment (PT) Description: LTG: Patient will ambulate in home environment, # of feet with assistance (PT). Outcome: Not Applicable Note: Not safe for ambulation due to painful  L knee   Problem: RH Wheelchair Mobility Goal: LTG Patient will propel w/c in controlled environment (PT) Description: LTG: Patient will propel wheelchair in controlled environment, # of feet with assist (PT) Flowsheets (Taken 08/30/2020 1704) LTG: Pt will propel w/c in controlled environ  assist needed:: Minimal Assistance - Patient > 75% LTG: Propel w/c distance in controlled environment: 50' Note: Limited by pain L knee   Problem: RH Stairs Goal: LTG Patient will ambulate up and down stairs w/assist (PT) Description: LTG: Patient will ambulate up and down # of stairs with assistance (PT) Outcome: Not Applicable  Sheran Lawless, PT

## 2020-08-30 NOTE — Progress Notes (Signed)
Physical Therapy Weekly Progress Note  Patient Details  Name: Justin Mason MRN: [id number] Date of Birth: Mar 12, 1981  Beginning of progress report period: August 24, 2020 End of progress report period: August 30, 2020  Today's Date: 08/30/2020 PT Individual Time: 1400-1456 PT Individual Time Calculation (min): 56 min   Patient has met 0 of 3 short term goals.  Patient demonstrated decline in function late last week with difficulty with sit to stand even with 2 A and with slide board transfers moving from occasional mod A of 1 to needing +2 A.  Feel possibly due to pain in L knee with recent MRI showing lat & med menisci tears, ACL disruption and tricompartmental arthritis.  Patient has begun to progress again slightly with slide board transfers able to move this session to chair with +1 A.  Family training has been initiated with [name], pt's girlfriend, and pt's mother x 1 session.  He will continue to benefit from skilled PT to progress mobility as tolerated and continue caregiver education.  LTG revisions completed this date.  Patient continues to demonstrate the following deficits muscle weakness and pain and abnormal tone, decreased coordination and decreased motor planning and therefore will continue to benefit from skilled PT intervention to increase functional independence with mobility.  Patient not progressing toward long term goals.  See goal revision..  Continue plan of care.  PT Short Term Goals Week 3:  PT Short Term Goal 1 (Week 3): Pt will transfer bed<>WC with LRAD and min A of 1 PT Short Term Goal 1 - Progress (Week 3): Progressing toward goal PT Short Term Goal 2 (Week 3): pt will initiate gait training PT Short Term Goal 2 - Progress (Week 3): Discontinued (comment) PT Short Term Goal 3 (Week 3): Pt will transfer sit<>stand with max of 1 consistantly PT Short Term Goal 3 - Progress (Week 3): Not met Week 4:  PT Short Term Goal 1 (Week 4): STG = LTG due to  ELOS  Skilled Therapeutic Interventions/Progress Updates:    Patient in w/c following OT session with girlfriend leaving.  Pushed in w/c to therapy gym.  Performed slide board transfer to mat with mod A +2.  Patient seated on mat placed both hands on large blue physioball with A for R for anterior weight shifts onto both feet and small lift of hips off mat with mod A x 10.  Patient to supine with mod A.  Performed R LE therex with A heel slides x 10, heel cord stretch 3 x 20 sec hold, hamstring and hip ER stretch x 20 sec both legs.  Performed bridging with both x 10 w/ 5 sec hold assist to keep R knee in, then with ball between knees x 10 and then unilateral bridge on R x 10.  Attempted kinesiotape to L knee for swelling/pain relief, but unable to stick due to pt's sweating.  Patient supine to sit with min A from R sidelying.  Performed slide board transfer to w/c with +1 mod A.  Patient propelled w/c x 50' with min A using hemi-technique.  Assisted rest of distance to room and left in chair with seat belt alarm active and call bell in reach.   Therapy Documentation Precautions:  Precautions Precautions: Fall Precaution Comments: Significant Right sided hemi; intermittent right sided lean Restrictions Weight Bearing Restrictions: No Pain: Pain Assessment Pain Scale: Faces Pain Score: 0-No pain Pain Type: Chronic pain Pain Location: Knee Pain Orientation: Left Pain Descriptors / Indicators: Sore Pain  Onset: On-going Pain Intervention(s): Repositioned   Therapy/Group: Individual Therapy  Reginia Naas  [place], [place] 08/30/2020, 5:09 PM

## 2020-08-30 NOTE — Consult Note (Signed)
Neuropsychological Consultation   Patient:   Justin Mason   DOB:   February 11, 1981  MR Number:  [id number]  Location:  [facility] [address] CENTER A [address] [id number] [place] [address] Dept: [phone] Loc: [phone]           Date of Service:   08/30/2020  Start Time:   9 AM End Time:   10 AM  Provider/Observer:  Arley Phenix, Psy.D.       Clinical Neuropsychologist       Billing Code/Service: [phone]  Chief Complaint:    Justin Mason is a 40 year old male with history of hypertension, morbid obesity, type 2 diabetes, obstructive sleep apnea. Patient was diagnosed with COVID-19 less than 1 week prior to admission. Patient presented on 07/24/2020 with dysarthria, right hemiparesis, right facial droop with inability to speak. Patient with complete nonfluent expressive aphasia. CTA revealed significant intracranial stenosis with occluded left M1 branch of the MCA. CT angioplasty of MCA attempted without success due to significant resistance and suspected to be chronic. Patient did tolerate extubation without difficulty and respiratory status was stable so no indication to treat Covid. 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 Patient with significant mobility deficits.  Reason for Service:  Patient was referred for neuropsychological consultation due to coping and adjustment issues following MCA infarction primarily affecting M1 branch. Patient with dense hemiplegia and complete expressive aphasia, dysgraphia and apraxia. Below is the HPI for the current admission.  HPI:  Justin Mason is a 40 year old male with history of HTN--poorly controlled (Dr. Tomie China), Morbid obesity, T2DM, OSA, diagnosis of Covid 19 less than one week prior to admission on 07/24/2020 with  dysarthria, right hemiparesis, progressing to right facial droop with inability to speak.  History taken from chart review and girlfriend due to aphasia.  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  CT perfusion showed large area of hypoperfusion without core infarct. CT angioplasty of MC attempted without success due to significant resistance and suspected to be chronic.  He tolerated extubation without difficulty and respiratory status has been stable--therefore no indications to treat Covid.  Echocardiogram with ejection fraction of 60-65%.  Dr. Pearlean Brownie recommended DAPT x3 months followed by aspirin alone.  Bedside swallow showed mild oropharyngeal dysphagia and patient placed on D3 thin liquid diet.  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  CIR was recommended due to functional decline.  Please see preadmission assessment from earlier today as well.  Current Status:  Patient was awake and alert sitting up in his bed when I entered the room. Patient was able to effectively provide yes no answers through head nods and facial expression but did not even attempt to speak or communicate otherwise. Patient appears to have intact receptive language abilities. Patient with significant motor deficits/right hemiplegia. Patient had been observed by nursing/therapy to have had crying spells at times but patient denies depression or anxiety symptoms currently that would keep him from being able to fully participate in therapeutic efforts. Patient acknowledges issues coping and adjusting to significant functional loss and deficits in mobility. Patient does have support through girlfriend. Patient was started on sertraline by the hospitalist and has continued taking sertraline on the CIR unit.  Behavioral Observation: Justin Mason  presents as a 40 y.o.-year-old Right  Caucasian Male who appeared his stated age. his dress was Appropriate and he was Well Groomed and his manners were Appropriate to the situation.  his participation was indicative of Appropriate and Attentive behaviors.  There were physical disabilities noted.  he displayed an appropriate level of cooperation and motivation.     Interactions:    Active Appropriate and Attentive  Attention:   within normal limits and attention span and concentration were age appropriate  Memory:   not examined; recent and remote memory intact  Visuo-spatial:  abnormal there have been indications of inattention  Speech (Volume):  low  Speech:   non-fluent aphasia; non-fluent aphasia  Thought Process:  Coherent and Relevant  Though Content:  WNL; not suicidal and not homicidal  Orientation:   Difficult to directly assess although patient does appear to be oriented to situation.  Judgment:   Fair  Planning:   Fair  Affect:    Depressed  Mood:    Dysphoric  Insight:   Good  Intelligence:   normal  Medical History:   Past Medical History:  Diagnosis Date  . AKI (acute kidney injury) (HCC)   . Arthritis   . Cardiac murmur 05/24/2020  . Cellulitis 01/03/2015  . Cellulitis of right leg   . Cellulitis of right lower extremity 01/03/2015  . Essential hypertension   . GERD (gastroesophageal reflux disease)   . High cholesterol   . Hyperlipemia   . Hypertension   . Morbid obesity (HCC) 05/24/2020  . OSA (obstructive sleep apnea)   . Rotator cuff tear, right 09/08/2011  . Sepsis (HCC) 01/03/2015  . Stroke (HCC)   . Type 2 diabetes mellitus with hyperglycemia [facility])          Patient Active Problem List   Diagnosis Date Noted  . Expressive aphasia   . Chronic pain of right knee   . History of hypertension   . Right elbow pain   . Acute blood loss anemia   . Right hemiparesis (HCC)   . Acute lower UTI   . Labile blood pressure   . Right hemiplegia (HCC)   . Hyponatremia   . AKI (acute  kidney injury) (HCC)   . Transaminitis   . Benign essential HTN   . Pressure injury of skin 08/09/2020  . Acute cerebral infarction (HCC) 08/09/2020  . Dyslipidemia   . Controlled type 2 diabetes mellitus with hyperglycemia, without long-term current use of insulin (HCC)   . 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  . 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 Morbid obesity (HCC) 05/24/2020  . Cardiac murmur 05/24/2020  . Arthritis   . High cholesterol   . Hyperlipemia   . Type 2 diabetes mellitus with hyperglycemia (HCC)   . Cellulitis 01/03/2015  . Cellulitis of right lower extremity 01/03/2015  . Sepsis (HCC) 01/03/2015  . Hypertension   . GERD (gastroesophageal reflux disease)   . Essential hypertension   . Cellulitis of right leg   . Rotator cuff tear, right 09/08/2011     Psychiatric History:  No prior psychiatric history  Family Med/Psych History:  Family History  Problem Relation Age of Onset  . Anesthesia problems Sister   . Prostate cancer Other   . Colon cancer Other     Impression/DX:  Justin Mason is a 40 year old male with history of hypertension, morbid obesity, type 2 diabetes, obstructive sleep apnea. Patient was diagnosed with COVID-19 less than 1 week prior to admission.  Patient presented on 07/24/2020 with dysarthria, right hemiparesis, right facial droop with inability to speak. Patient with complete nonfluent expressive aphasia. CTA revealed significant intracranial stenosis with occluded left M1 branch of the MCA. CT angioplasty of MCA attempted without success due to significant resistance and suspected to be chronic. Patient did tolerate extubation without difficulty and respiratory status was stable so no indication to treat Covid. 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 Patient with  significant mobility deficits.  Patient was awake and alert sitting up in his bed when I entered the room. Patient was able to effectively provide yes no answers through head nods and facial expression but did not even attempt to speak or communicate otherwise. Patient appears to have intact receptive language abilities. Patient with significant motor deficits/right hemiplegia. Patient had been observed by nursing/therapy to have had crying spells at times but patient denies depression or anxiety symptoms currently that would keep him from being able to fully participate in therapeutic efforts. Patient acknowledges issues coping and adjusting to significant functional loss and deficits in mobility. Patient does have support through girlfriend. Patient was started on sertraline by the hospitalist and has continued taking sertraline on the CIR unit.  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        Electronically Signed   _______________________ Arley Phenix, Psy.D. Clinical Neuropsychologist

## 2020-08-30 NOTE — Progress Notes (Signed)
[place] PHYSICAL MEDICINE & REHABILITATION PROGRESS NOTE  Subjective/Complaints: Patient seen laying in bed this morning.  He indicates he slept well overnight.  He notes some improvement elbow/knee pain.  ROS: limited due to language  Objective: Vital Signs: Blood pressure 111/71, pulse 61, temperature 98 F (36.7 C), resp. rate 16, height 6\' 1"  (1.854 m), weight (!) 155 kg, SpO2 98 %. MR KNEE LEFT W WO CONTRAST  Result Date: 08/30/2020 CLINICAL DATA:  Chronic left knee pain and instability. No known injury. EXAM: MRI OF THE LEFT KNEE WITHOUT AND WITH CONTRAST TECHNIQUE: Multiplanar, multisequence MR imaging of the knee was performed before and after the administration of intravenous contrast. CONTRAST:  10 mL GADAVIST IV SOLN COMPARISON:  Plain films left knee 08/28/2020. FINDINGS: MENISCI Medial meniscus: There is a radial tear through the root of the posterior horn. Lateral meniscus: Severe complex tearing is seen in the posterior horn. The majority of the anterior horn is not visualized consistent with degenerative maceration. LIGAMENTS Cruciates:  Chronic, complete ACL tear.  The PCL is intact. Collaterals:  Intact. CARTILAGE Patellofemoral:  Severely degenerated. Medial:  Severely degenerated. Lateral:  Severely degenerated. Joint: Small joint effusion. 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 Popliteal Fossa:  Very small Baker's cyst. Extensor Mechanism:  Intact. Bones:  Bulky tricompartmental osteophytosis. Other: None. 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 Tearing of both the medial and lateral menisci as described above. Lipoma arborescens. Electronically Signed   By: 08/30/2020 M.D.   On: 08/30/2020 08:27   DG Knee Complete 4 Views Left  Result Date: 08/28/2020 CLINICAL DATA:  Knee pain EXAM: LEFT KNEE - COMPLETE 4+ VIEW COMPARISON:  None. FINDINGS: Moderate joint effusion.  Tricompartmental osteoarthritis with joint space narrowing and marginal osteophyte formation. No evidence of fracture. Possible small loose bodies visible on the lateral view. No focal bone finding otherwise. IMPRESSION: Tricompartmental osteoarthritis with moderate joint effusion and possible small loose bodies. Electronically Signed   By: 08/30/2020 M.D.   On: 08/28/2020 18:29   No results for input(s): WBC, HGB, HCT, PLT in the last 72 hours. No results for input(s): NA, K, CL, CO2, GLUCOSE, BUN, CREATININE, CALCIUM in the last 72 hours.  Intake/Output Summary (Last 24 hours) at 08/30/2020 0947 Last data filed at 08/30/2020 0745 Gross per 24 hour  Intake 600 ml  Output --  Net 600 ml     Pressure Injury 08/09/20 Buttocks Left;Right Deep Tissue Pressure Injury - Purple or maroon localized area of discolored intact skin or blood-filled blister due to damage of underlying soft tissue from pressure and/or shear. (Active)  08/09/20 1716  Location: Buttocks  Location Orientation: Left;Right  Staging: Deep Tissue Pressure Injury - Purple or maroon localized area of discolored intact skin or blood-filled blister due to damage of underlying soft tissue from pressure and/or shear.  Wound Description (Comments):   Present on Admission: Yes    Physical Exam: BP 111/71 (BP Location: Left Arm)   Pulse 61   Temp 98 F (36.7 C)   Resp 16   Ht 6\' 1"  (1.854 m)   Wt (!) 155 kg   SpO2 98%   BMI 45.08 kg/m   Constitutional: No distress . Vital signs reviewed.  Morbidly obese. HENT: Normocephalic.  Atraumatic. Eyes: EOMI. No discharge. Cardiovascular: No JVD.  RRR. Respiratory: Normal effort.  No stridor.  Bilateral clear to auscultation. GI: Non-distended.  BS +. Skin: Warm and dry.   Intergluteal  ulcer healing. Psych: Normal mood.  Normal behavior. Musc:  Right upper extremity edema, stable Right elbow tenderness, stable Left knee TTP, stable Neuro: Alert Expressive aphasia,  unchanged Nonverbal, stable Motor: RUE 0/5 proximal to 0/5 distal, stable RLE: 3 -/5 proximal to distal, stable  Assessment/Plan: 1. Functional deficits which require 3+ hours per day of interdisciplinary therapy in a comprehensive inpatient rehab setting.  Physiatrist is providing close team supervision and 24 hour management of active medical problems listed below.  Physiatrist and rehab team continue to assess barriers to discharge/monitor patient progress toward functional and medical goals   Care Tool:  Bathing    Body parts bathed by patient: Right arm,Chest,Abdomen,Left upper leg,Face,Right upper leg,Right lower leg,Left lower leg,Front perineal area   Body parts bathed by helper: Buttocks,Left arm,Right arm,Right lower leg,Left lower leg     Bathing assist Assist Level: Moderate Assistance - Patient 50 - 74%     Upper Body Dressing/Undressing Upper body dressing   What is the patient wearing?: Pull over shirt    Upper body assist Assist Level: Moderate Assistance - Patient 50 - 74%    Lower Body Dressing/Undressing Lower body dressing      What is the patient wearing?: Pants     Lower body assist Assist for lower body dressing: Moderate Assistance - Patient 50 - 74%     Toileting Toileting    Toileting assist Assist for toileting: Total Assistance - Patient < 25% Assistive Device Comment: Bedrails   Transfers Chair/bed transfer  Transfers assist  Chair/bed transfer activity did not occur: Safety/medical concerns  Chair/bed transfer assist level: 2 Helpers     Locomotion Ambulation   Ambulation assist   Ambulation activity did not occur: Safety/medical concerns          Walk 10 feet activity   Assist  Walk 10 feet activity did not occur: Safety/medical concerns        Walk 50 feet activity   Assist Walk 50 feet with 2 turns activity did not occur: Safety/medical concerns         Walk 150 feet activity   Assist Walk 150  feet activity did not occur: Safety/medical concerns         Walk 10 feet on uneven surface  activity   Assist Walk 10 feet on uneven surfaces activity did not occur: Safety/medical concerns         Wheelchair     Assist Will patient use wheelchair at discharge?: Yes Type of Wheelchair: Manual    Wheelchair assist level: Minimal Assistance - Patient > 75% Max wheelchair distance: 100    Wheelchair 50 feet with 2 turns activity    Assist        Assist Level: Minimal Assistance - Patient > 75%   Wheelchair 150 feet activity     Assist      Assist Level: Moderate Assistance - Patient 50 - 74%   Medical Problem List and Plan: 1.  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  Continue CIR  WHO/PRAFO nightly--elevate/support RUE while in bed/chair 2.  Antithrombotics: -DVT/anticoagulation:  Pharmaceutical: Continue Lovenox  Dopplers negative for DVT             -antiplatelet therapy: DAPT X 3 months followed by ASA alone (changed to 81)--Plavix added on 08/09/20 3. Pain Management: Mobic DC'd   Right elbow pain:  CT performed and really unremarkable except for  chronic anconeus tendinosis  Trial of gabapentin 100mg  tid  Voltaren gel started on 2/23  Appears to be improving on 2/25 4. Mood: LCSW to follow for evaluation and support.              -antipsychotic agents: N/A 5. Neuropsych: This patient is not fully capable of making decisions on his own behalf.  Discussed with Neuropsych before/after eval - limited due to language, however, expected coping with loss 6. Skin/Wound Care: Routine pressure relief measures.   Santyl to intergluteal ulcer 7. Fluids/Electrolytes/Nutrition: Monitor I/Os.  8. HTN: Monitor BP tid   HCTZ daily, DC'd on 2/7  Avapro decreased to 150 on 2/8  Catapres twice daily, Lopressor bid.   Relatively controlled  on 2/25 9. T2DM with hyperglycemia: Hgb A1c-6.2.   CBG (last 3)  Recent Labs    08/29/20 1747 08/29/20 2057 08/30/20 0628  GLUCAP 109* 111* 133*      Metformin 1000 mg bid  Farxiga restarted  Amaryl restarted on 2/23  Improving on 2/25, cont to monitor 10. Covid 19--Positive  on 07/24/20-->no treatment as asymptomatic. Continue zinc and vitamin C.  11. Morbid obesity: BMI 46. Educate on appropriate diet and exercise to help promote health and mobility.              Encourage weight loss 12.  Hyperlipidemia: Crestor 13. H/o gout: continue allopurinol.  14. Hyponatremia  Na+ 136 on 2/21   15. AKI:   Cr.  0.90 on 2/21, labs ordered for Monday  Received IVF x3 nights ended on 2/11   Echo: EF 60-65%  Continue to encourage fluids 16. Elevated liver enzymes: Resolved  ALT within normal limits on 2/11 17. Mild leukocytosis: Resolved  WBCs 8.2 on 2/14  Encourage IS as possible, OOB  See #18 18.  Acute lower UTI  UA +, urine culture showing E. coli  Completed course of Macrobid  20.  Acute blood loss anemia  Hemoglobin 12.0 on 2/21 21.  Left knee pain - acute on chronic  Xray showing OA   MRI personally reviewed showing OA.  Per report complete ACL medial lateral menisci tears in addition to severe OA and lipoma arborescens  See #3  LOS: 21 days A FACE TO FACE EVALUATION WAS PERFORMED  Zurich Carreno 3/21 08/30/2020, 9:47 AM

## 2020-08-30 NOTE — Progress Notes (Signed)
Occupational Therapy Session Note  Patient Details  Name: Justin Mason MRN: [id number] Date of Birth: April 23, 1981  Today's Date: 08/30/2020 OT Individual Time: 1300-1400 OT Individual Time Calculation (min): 60 min    Short Term Goals: Week 3:  OT Short Term Goal 1 (Week 3): patient will doff/donn OH shirt with min A OT Short Term Goal 2 (Week 3): Patient will doff/donn pants min A using LRAD and complete clothing management with max A OT Short Term Goal 3 (Week 3): patient will complete SB transfer to/from bed, drop arm commode and w/c with mod A of one  Skilled Therapeutic Interventions/Progress Updates:    Treatment session with focus on family education with pt's SO, [name], in regards to self-care and transfers. Pt received semi-reclined in bed not wearing pants.  Educated SO on hemi-technique and use of figure 4 positioning for LB bathing and dressing.  Pt able to thread RLE in figure 4 with setup to obtain position and then utilized reacher to thread LLE.  Pt able to bridge partially with support to maintain positioning of RLE while pt able to pull pants over L hip.  Pt then rolled to L with mod assist from therapist and then therapist pulled R side of pants over hip.  Previous therapists had begun initiating hoyer transfer training therefore planned to complete with pt and SO.  Educated on placement of hoyer sling and setup as well as tipping back w/c to increase positioning of pt in w/c.  Therapist and 2nd therapist lifted pt into hoyer with lift sling, however when attempting to position to then lower in to w/c, unable to tip w/c far enough back to allow for adequate angle for positioning and no handle on particular sling to position pt back enough for adequate angle.  Therefore returned pt to bed as use of hoyer with particular sling and w/c not appropriate and not decreasing burden of care on family/caregivers.  Pt completed sidelying to sitting with mod assist and transferred mod assist  +2 to w/c via slideboard with pt able to initiate weight shift with facilitation due to motor planning deficits.  Plan to further discuss appropriate transfers methods for home and caregiver level of physical ability with primary team.  Pt completed UB bathing and dressing with min assist and cues for hemi-technique.  Pt passed off to PT and discussed issues with hoyer transfer this session.  Therapy Documentation Precautions:  Precautions Precautions: Fall Precaution Comments: Significant Right sided hemi; intermittent right sided lean Restrictions Weight Bearing Restrictions: No General:   Vital Signs: Therapy Vitals Temp: 97.6 F (36.4 C) Temp Source: Oral Pulse Rate: 88 Resp: 18 BP: 127/88 Patient Position (if appropriate): Sitting Oxygen Therapy SpO2: 97 % O2 Device: Room Air Pain:  Pt with c/o pain in R shoulder with overhead movement.   Therapy/Group: Individual Therapy  Rosalio Loud 08/30/2020, 4:05 PM

## 2020-08-30 NOTE — Plan of Care (Signed)
  Problem: Consults Goal: RH STROKE PATIENT EDUCATION Description: See Patient Education module for education specifics  Outcome: Progressing   Problem: RH BLADDER ELIMINATION Goal: RH STG MANAGE BLADDER WITH ASSISTANCE Description: STG Manage Bladder With min Assistance Outcome: Progressing   Problem: RH PAIN MANAGEMENT Goal: RH STG PAIN MANAGED AT OR BELOW PT'S PAIN GOAL Description: Pain level less than 4 on scale of 0-10 Outcome: Progressing   Problem: RH SKIN INTEGRITY Goal: RH STG MAINTAIN SKIN INTEGRITY WITH ASSISTANCE Description: STG Maintain Skin Integrity With min Assistance. Outcome: Progressing   

## 2020-08-30 NOTE — Progress Notes (Signed)
/  Speech Language Pathology Weekly Progress and Session Note  Patient Details  Name: Justin Mason MRN: [id number] Date of Birth: 30-Jun-1981  Beginning of progress report period: 08/23/2020  End of progress report period: 08/30/2020  Today's Date: 08/30/2020 SLP Individual Time: 1000-1100 SLP Individual Time Calculation (min): 60 min  Short Term Goals: Week 3: SLP Short Term Goal 1 (Week 3): Patient will coordinate breath support during production of voiceless phonemes /h/, /p/, etc. with modA cues. SLP Short Term Goal 1 - Progress (Week 3): Met SLP Short Term Goal 2 (Week 3): Patient will make requests using communication board/AAC device with minA after setup A. SLP Short Term Goal 2 - Progress (Week 3): Met SLP Short Term Goal 3 (Week 3): Patient will produce CVCV (consonant-vowel-consonant-vowel) words (reduplicated: baba, papa, etc) with modA cues . SLP Short Term Goal 3 - Progress (Week 3): Met SLP Short Term Goal 4 (Week 3): Patient will imitate to mouth one-syllable words with maxA. SLP Short Term Goal 4 - Progress (Week 3): Met SLP Short Term Goal 5 (Week 3): Patient will manage right sided buccal pocketing of regular solids with intermittentA cues SLP Short Term Goal 5 - Progress (Week 3): Progressing toward goal    New Short Term Goals: Week 4: SLP Short Term Goal 1 (Week 4): STG=LTG (discharge anticipated 3/4)  Weekly Progress Updates:  Patient has made very good progress and met 4/5 STG's and making progress towards the other. He is currently able to more consistently producing voicing at phoneme level, coordinate breath support/airflow with oral motor movements during mouthing of words, able to produce 2 syllable bilabial and lingual consonant words (baby, noodle, etc) with cues. He is able to demonstrate functional ability to use AAC touchscreen device which could potentially be something he could use in future for communication.    Intensity: Minumum of 1-2 x/day, 30 to 90  minutes Frequency: 3 to 5 out of 7 days Duration/Length of Stay:   Treatment/Interventions: Cognitive remediation/compensation;Multimodal communication approach;Functional tasks;Therapeutic Activities;Therapeutic Exercise;Oral motor exercises;Dysphagia/aspiration precaution training;Patient/family education   Daily Session  Skilled Therapeutic Interventions: Patient seen to address speech-language goals. Patient in bed but agreeable to therapy session. He was able to achieve approximately 85% consistency in voicing when producing CV (consonant-vowel) words with bilabial and lingual phonemes. He continues to require cues to manage and control breath support during speech trials. He was able to imitate to produce 2-3 syllable bilabial phoneme combinations with intermittent voicing but consistent adequate oral motor movements for phonemes. He counted from 1-4 before getting perseverative with saying "two" and although he appears with awareness, he is unable to self correct with SLP assist. Patient continues to benefit from skilled SLP intervention to maximize cognitive-linguistic and speech goals prior to discharge.   General    Pain Pain Assessment Pain Scale: 0-10 Pain Score: 0-No pain  Therapy/Group: Individual Therapy  Sonia Baller, MA, CCC-SLP Speech Therapy

## 2020-08-31 LAB — GLUCOSE, CAPILLARY
Glucose-Capillary: 137 mg/dL — ABNORMAL HIGH (ref 70–99)
Glucose-Capillary: 107 mg/dL — ABNORMAL HIGH (ref 70–99)
Glucose-Capillary: 97 mg/dL (ref 70–99)
Glucose-Capillary: 124 mg/dL — ABNORMAL HIGH (ref 70–99)

## 2020-08-31 NOTE — Progress Notes (Signed)
Occupational Therapy Session Note  Patient Details  Name: Justin Mason MRN: [id number] Date of Birth: [birth year]  Today's Date: 08/31/2020 OT Individual Time: 1330-1426 OT Individual Time Calculation (min): 56 min    Short Term Goals: Week 1:  OT Short Term Goal 1 (Week 1): Pt will complete sit<>stand at stedy with min assist in preperation for self care. OT Short Term Goal 1 - Progress (Week 1): Met OT Short Term Goal 2 (Week 1): Pt will complete UB dressing with mod assist using hemitechniques. OT Short Term Goal 2 - Progress (Week 1): Met OT Short Term Goal 3 (Week 1): Pt will complete toilet transfer with mod assist +2 using LRAD. OT Short Term Goal 3 - Progress (Week 1): Progressing toward goal OT Short Term Goal 4 (Week 1): Pt will bathe UB/LB with mod assist using long handled sponge. OT Short Term Goal 4 - Progress (Week 1): Met  Skilled Therapeutic Interventions/Progress Updates:    1:1. Pt received in bed agreeable to OT. Pt using thumbs up or head nod/shake for communication. No pain per pt. Pt agreeable to bathing and UB dressing. Pt declines peri/buttocks bathing. Pt able to crossing into figure 4 in bed with HOB elevated with MIN A for RLE. Pt washes 80% of BLE in this position with OT providing other A. Pt completes UB dressing with MIN A to doff/don pulling shirt down back. Ot applies K tape to RUE, however at end of session show signs of peeling off d/t sweating. Pt completes 3x5 sit ups in supported circle sitting with OT lowering HOB back towards supine with MIN A overall to imprve trunk control/core strength. Exited session with pt seated in bed, exit alarm on and call light in reach   Therapy Documentation Precautions:  Precautions Precautions: Fall Precaution Comments: Significant Right sided hemi; intermittent right sided lean Restrictions Weight Bearing Restrictions: No General:   Vital Signs:   Pain: Pain Assessment Pain Scale: 0-10 Pain Score: 0-No  pain ADL: ADL Upper Body Bathing: Moderate assistance Where Assessed-Upper Body Bathing: Edge of bed Lower Body Bathing: Maximal assistance Where Assessed-Lower Body Bathing: Edge of bed,Bed level Upper Body Dressing: Other (Comment) (+2) Where Assessed-Upper Body Dressing: Edge of bed Lower Body Dressing: Dependent Where Assessed-Lower Body Dressing: Bed level Toileting: Dependent,Other (Comment) (+2) Where Assessed-Toileting: Bed level Vision   Perception    Praxis   Exercises:   Other Treatments:     Therapy/Group: Individual Therapy  Tonny Branch 08/31/2020, 12:18 PM

## 2020-08-31 NOTE — Plan of Care (Signed)
  Problem: Consults Goal: RH STROKE PATIENT EDUCATION Description: See Patient Education module for education specifics  Outcome: Progressing   Problem: RH BLADDER ELIMINATION Goal: RH STG MANAGE BLADDER WITH ASSISTANCE Description: STG Manage Bladder With min Assistance Outcome: Progressing   Problem: RH PAIN MANAGEMENT Goal: RH STG PAIN MANAGED AT OR BELOW PT'S PAIN GOAL Description: Pain level less than 4 on scale of 0-10 Outcome: Progressing   Problem: RH SKIN INTEGRITY Goal: RH STG MAINTAIN SKIN INTEGRITY WITH ASSISTANCE Description: STG Maintain Skin Integrity With min Assistance. Outcome: Progressing   

## 2020-08-31 NOTE — Progress Notes (Signed)
[facility] PHYSICAL MEDICINE & REHABILITATION PROGRESS NOTE  Subjective/Complaints:   Pt seen in room- laying on bariatric bed Denies pain currently LBM was today   ROS: limited due to language/aphasia  Objective: Vital Signs: Blood pressure (!) 119/51, pulse 69, temperature 98.7 F (37.1 C), resp. rate 20, height 6\' 1"  (1.854 m), weight (!) 149 kg, SpO2 97 %. MR KNEE LEFT W WO CONTRAST  Result Date: 08/30/2020 CLINICAL DATA:  Chronic left knee pain and instability. No known injury. EXAM: MRI OF THE LEFT KNEE WITHOUT AND WITH CONTRAST TECHNIQUE: Multiplanar, multisequence MR imaging of the knee was performed before and after the administration of intravenous contrast. CONTRAST:  10 mL GADAVIST IV SOLN COMPARISON:  Plain films left knee 08/28/2020. FINDINGS: MENISCI Medial meniscus: There is a radial tear through the root of the posterior horn. Lateral meniscus: Severe complex tearing is seen in the posterior horn. The majority of the anterior horn is not visualized consistent with degenerative maceration. LIGAMENTS Cruciates:  Chronic, complete ACL tear.  The PCL is intact. Collaterals:  Intact. CARTILAGE Patellofemoral:  Severely degenerated. Medial:  Severely degenerated. Lateral:  Severely degenerated. Joint: Small joint effusion. 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 Popliteal Fossa:  Very small Baker's cyst. Extensor Mechanism:  Intact. Bones:  Bulky tricompartmental osteophytosis. Other: None. 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 Tearing of both the medial and lateral menisci as described above. Lipoma arborescens. Electronically Signed   By: 08/30/2020 M.D.   On: 08/30/2020 08:27   No results for input(s): WBC, HGB, HCT, PLT in the last 72 hours. No results for input(s): NA, K, CL, CO2, GLUCOSE, BUN, CREATININE, CALCIUM in the last 72 hours.  Intake/Output Summary (Last 24  hours) at 08/31/2020 1641 Last data filed at 08/31/2020 1245 Gross per 24 hour  Intake 771 ml  Output --  Net 771 ml     Pressure Injury 08/09/20 Buttocks Left;Right Deep Tissue Pressure Injury - Purple or maroon localized area of discolored intact skin or blood-filled blister due to damage of underlying soft tissue from pressure and/or shear. (Active)  08/09/20 1716  Location: Buttocks  Location Orientation: Left;Right  Staging: Deep Tissue Pressure Injury - Purple or maroon localized area of discolored intact skin or blood-filled blister due to damage of underlying soft tissue from pressure and/or shear.  Wound Description (Comments):   Present on Admission: Yes    Physical Exam: BP (!) 119/51 (BP Location: Left Arm)   Pulse 69   Temp 98.7 F (37.1 C)   Resp 20   Ht 6\' 1"  (1.854 m)   Wt (!) 149 kg   SpO2 97%   BMI 43.34 kg/m   Constitutional: No distress . Vital signs reviewed.  Morbidly obese with BMI of 43- aphasic, laying on bariatric bed HENT:; oropharynx moist CV: regular rate; no JVD Pulmonary: CTA B/L; no W/R/R- good air movement GI: soft, NT, ND, (+)BS- protuberant Psychiatric: appropriate- appears a little flat/sad Neurological: nonverbal but nodding head yes and no  Skin: Warm and dry.   Intergluteal ulcer healing. Musc:  Right upper extremity edema, stable Right elbow tenderness, stable Left knee TTP, stable Neuro: Alert Expressive aphasia, unchanged Nonverbal, stable Motor: RUE 0/5 proximal to 0/5 distal, stable RLE: 3 -/5 proximal to distal, stable  Assessment/Plan: 1. Functional deficits which require 3+ hours per day of interdisciplinary therapy in a comprehensive inpatient rehab setting.  Physiatrist is providing close team supervision and 24 hour management  of active medical problems listed below.  Physiatrist and rehab team continue to assess barriers to discharge/monitor patient progress toward functional and medical goals   Care  Tool:  Bathing    Body parts bathed by patient: Right arm,Chest,Abdomen   Body parts bathed by helper: Left arm     Bathing assist Assist Level: Minimal Assistance - Patient > 75%     Upper Body Dressing/Undressing Upper body dressing   What is the patient wearing?: Pull over shirt    Upper body assist Assist Level: Minimal Assistance - Patient > 75%    Lower Body Dressing/Undressing Lower body dressing      What is the patient wearing?: Pants     Lower body assist Assist for lower body dressing: Moderate Assistance - Patient 50 - 74%     Toileting Toileting    Toileting assist Assist for toileting: Total Assistance - Patient < 25% Assistive Device Comment: Bedrails   Transfers Chair/bed transfer  Transfers assist  Chair/bed transfer activity did not occur: Safety/medical concerns  Chair/bed transfer assist level: 2 Helpers     Locomotion Ambulation   Ambulation assist   Ambulation activity did not occur: Safety/medical concerns          Walk 10 feet activity   Assist  Walk 10 feet activity did not occur: Safety/medical concerns        Walk 50 feet activity   Assist Walk 50 feet with 2 turns activity did not occur: Safety/medical concerns         Walk 150 feet activity   Assist Walk 150 feet activity did not occur: Safety/medical concerns         Walk 10 feet on uneven surface  activity   Assist Walk 10 feet on uneven surfaces activity did not occur: Safety/medical concerns         Wheelchair     Assist Will patient use wheelchair at discharge?: Yes Type of Wheelchair: Manual    Wheelchair assist level: Minimal Assistance - Patient > 75% Max wheelchair distance: 100    Wheelchair 50 feet with 2 turns activity    Assist        Assist Level: Minimal Assistance - Patient > 75%   Wheelchair 150 feet activity     Assist      Assist Level: Moderate Assistance - Patient 50 - 74%   Medical Problem  List and Plan: 1.  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  Continue CIR  WHO/PRAFO nightly--elevate/support RUE while in bed/chair 2.  Antithrombotics: -DVT/anticoagulation:  Pharmaceutical: Continue Lovenox  Dopplers negative for DVT             -antiplatelet therapy: DAPT X 3 months followed by ASA alone (changed to 81)--Plavix added on 08/09/20 3. 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 Mood: LCSW to follow for evaluation and support.              -antipsychotic agents: N/A 5. Neuropsych: This patient is not fully capable of making decisions on his own behalf.  Discussed with Neuropsych before/after eval - limited due to language,  however, expected coping with loss 6. Skin/Wound Care: Routine pressure relief measures.   Santyl to intergluteal ulcer 7. Fluids/Electrolytes/Nutrition: Monitor I/Os.  8. HTN: Monitor BP tid   HCTZ daily, DC'd on 2/7  Avapro decreased to 150 on 2/8  Catapres twice daily, Lopressor bid.   2/26- BP controlled- 119/51- con't regimen 9. T2DM with hyperglycemia: Hgb A1c-6.2.   CBG (last 3)  Recent Labs    08/31/20 0600 08/31/20 1138 08/31/20 1635  GLUCAP 137* 107* 97      Metformin 1000 mg bid  Farxiga restarted  Amaryl restarted on 2/23  2/26- BGs look much better- 97-137- con't regimen 10. Covid 19--Positive  on 07/24/20-->no treatment as asymptomatic. Continue zinc and vitamin C.  11. Morbid obesity: BMI 46. Educate on appropriate diet and exercise to help promote health and mobility.  2/26- BMI down to 43- con't to work on this with  diet/exercise              Encourage weight loss 12.  Hyperlipidemia: Crestor 13. H/o gout: continue allopurinol.  14. Hyponatremia  Na+ 136 on 2/21   15. AKI:   Cr.  0.90 on 2/21, labs ordered for Monday  Received IVF x3 nights ended on 2/11   Echo: EF 60-65%  Continue to encourage fluids 16. Elevated liver enzymes: Resolved  ALT within normal limits on 2/11 17. Mild leukocytosis: Resolved  WBCs 8.2 on 2/14  Encourage IS as possible, OOB  See #18 18.  Acute lower UTI  UA +, urine culture showing E. coli  Completed course of Macrobid  20.  Acute blood loss anemia  Hemoglobin 12.0 on 2/21 21.  Left knee pain - acute on chronic  Xray showing OA   MRI personally reviewed showing OA.  Per report complete ACL medial lateral menisci tears in addition to severe OA and lipoma arborescens  See #3  LOS: 22 days A FACE TO FACE EVALUATION WAS PERFORMED  Justin Mason 08/31/2020, 4:41 PM

## 2020-08-31 NOTE — Progress Notes (Signed)
Speech Language Pathology Daily Session Note  Patient Details  Name: BEAUDEN TREMONT MRN: [id number] Date of Birth: October 27, 1980  Today's Date: 08/31/2020 SLP Individual Time: 1015-1100 SLP Individual Time Calculation (min): 45 min  Short Term Goals: Week 4: SLP Short Term Goal 1 (Week 4): STG=LTG (discharge anticipated 3/4)  Skilled Therapeutic Interventions: Pt seen for skilled ST with focus on communication goals, pt very motivated with all therapeutic tasks. Pt able to name 10 common objects with some mild articulation errors, benefits from verbal and tactile cues for increase breath support. Pt able to achieve ~80% consistency in voicing when producing consonant-vowel words, strength noted in words with initial bilabials and lingual phonemes. Pt with most difficulty producing "f" and "h" words despite max multimodal cues. Pt able to produce 2-3 word phrases in describing pictures and during unstructured speech tasks, able to count 1-6 independently. SLP encouraging patient to continue to use verbal responses with staff/family for the rest of the day vs gestures (continues to prefer to nod/shake head and mime actions vs verbal expression).  Pt left in bed with alarm set and all needs within reach, cont ST POC.   Pain Pain Assessment Pain Scale: 0-10 Pain Score: 0-No pain  Therapy/Group: Individual Therapy  Tacey Ruiz 08/31/2020, 10:41 AM

## 2020-09-01 LAB — GLUCOSE, CAPILLARY
Glucose-Capillary: 154 mg/dL — ABNORMAL HIGH (ref 70–99)
Glucose-Capillary: 128 mg/dL — ABNORMAL HIGH (ref 70–99)
Glucose-Capillary: 109 mg/dL — ABNORMAL HIGH (ref 70–99)
Glucose-Capillary: 150 mg/dL — ABNORMAL HIGH (ref 70–99)

## 2020-09-01 NOTE — Plan of Care (Signed)
  Problem: Consults Goal: RH STROKE PATIENT EDUCATION Description: See Patient Education module for education specifics  Outcome: Progressing   Problem: RH BLADDER ELIMINATION Goal: RH STG MANAGE BLADDER WITH ASSISTANCE Description: STG Manage Bladder With min Assistance Outcome: Progressing   Problem: RH PAIN MANAGEMENT Goal: RH STG PAIN MANAGED AT OR BELOW PT'S PAIN GOAL Description: Pain level less than 4 on scale of 0-10 Outcome: Progressing   Problem: RH SKIN INTEGRITY Goal: RH STG MAINTAIN SKIN INTEGRITY WITH ASSISTANCE Description: STG Maintain Skin Integrity With min Assistance. Outcome: Progressing   

## 2020-09-02 DIAGNOSIS — M25562 Pain in left knee: Secondary | ICD-10-CM

## 2020-09-02 DIAGNOSIS — G8929 Other chronic pain: Secondary | ICD-10-CM

## 2020-09-02 LAB — CBC
HCT: 35.3 % — ABNORMAL LOW (ref 39.0–52.0)
Hemoglobin: 12 g/dL — ABNORMAL LOW (ref 13.0–17.0)
MCH: 30 pg (ref 26.0–34.0)
MCHC: 34 g/dL (ref 30.0–36.0)
MCV: 88.3 fL (ref 80.0–100.0)
Platelets: 259 10*3/uL (ref 150–400)
RBC: 4 MIL/uL — ABNORMAL LOW (ref 4.22–5.81)
RDW: 12.5 % (ref 11.5–15.5)
WBC: 7.8 10*3/uL (ref 4.0–10.5)
nRBC: 0 % (ref 0.0–0.2)

## 2020-09-02 LAB — GLUCOSE, CAPILLARY
Glucose-Capillary: 126 mg/dL — ABNORMAL HIGH (ref 70–99)
Glucose-Capillary: 144 mg/dL — ABNORMAL HIGH (ref 70–99)
Glucose-Capillary: 116 mg/dL — ABNORMAL HIGH (ref 70–99)
Glucose-Capillary: 117 mg/dL — ABNORMAL HIGH (ref 70–99)

## 2020-09-02 LAB — BASIC METABOLIC PANEL
Anion gap: 10 (ref 5–15)
BUN: 13 mg/dL (ref 6–20)
CO2: 27 mmol/L (ref 22–32)
Calcium: 9.4 mg/dL (ref 8.9–10.3)
Chloride: 101 mmol/L (ref 98–111)
Creatinine, Ser: 0.95 mg/dL (ref 0.61–1.24)
GFR, Estimated: >60 mL/min (ref >60)
Glucose, Bld: 131 mg/dL — ABNORMAL HIGH (ref 70–99)
Potassium: 3.9 mmol/L (ref 3.5–5.1)
Sodium: 138 mmol/L (ref 135–145)

## 2020-09-02 NOTE — Progress Notes (Signed)
Physical Therapy Session Note  Patient Details  Name: Justin Mason MRN: [id number] Date of Birth: 30-May-1981  Today's Date: 09/02/2020 PT Individual Time: 5974-1638 PT Individual Time Calculation (min): 61 min   Short Term Goals: Week 4:  PT Short Term Goal 1 (Week 4): STG = LTG due to ELOS  Skilled Therapeutic Interventions/Progress Updates:    Patient in w/c with significant other, [name], in the room.  Reports hoyer transfers went better with OT this morning, but now she is going to a doctor's appointment.  Patient pushed in w/c to therapy gym.  Assisted some with w/c set up for transfer removing arm tray and locking brake on L.  Patient max A to place sliding board and performed slide board transfer to mat with mod A overall.  Difficult to initiate, but once performing increased anterior weight shifts improved.    Patient performed anterior weight shifts with both hands on blue physioball rollouts with min A and cues for using legs for unweighting hips x 10.  Patient sit to stand (squat) to EVA walker with mod A from elevated surface.  Unable to fully extend knees due to L knee pain and R weakness.  Seated lift and scoots to R then L with mod A with minimal hip clearance. Noted IV in L arm partially out so RN in to remove and place dressing.  Patient sit to supine min A for R LE.  Patient in supine for bridging with ball between knees x 10 w/ 5 sec hold, single leg bridge on R x 10, heel slides with A on R x 10.  Rolled to L with cues and min A, to R with S and side to sit from R sidelying min A for trunk, increased time for R leg off bed.    Patient performed slide board transfer back to w/c with mod A attempting to lift with scooting, but without much lift.  Patient assisted in w/c to room and left with seat belt alarm active, call bell in reach and RN aware.   Therapy Documentation Precautions:  Precautions Precautions: Fall Precaution Comments: Significant Right sided hemi;  intermittent right sided lean Restrictions Weight Bearing Restrictions: No Pain: Pain Assessment Pain Scale: 0-10 Pain Score: 6  Pain Type: Chronic pain Pain Location: Knee Pain Orientation: Left Pain Descriptors / Indicators: Discomfort Pain Onset: On-going Pain Intervention(s): Rest    Therapy/Group: Individual Therapy  Elray Mcgregor  Sheran Lawless, PT 09/02/2020, 12:48 PM

## 2020-09-02 NOTE — Progress Notes (Signed)
Occupational Therapy Weekly Progress Note  Patient Details  Name: Justin Mason MRN: [id number] Date of Birth: 04/03/1981  Beginning of progress report period: August 26, 2020 End of progress report period: September 02, 2020     Patient has met 3 of 3 short term goals.  Patient continues to make slow gains in motor control of right side and is limited by pain/instability in left knee but has met adl goals due to good carryover of strategies and improved trunk and right leg mobility.    Patient continues to demonstrate the following deficits: muscle weakness, decreased cardiorespiratoy endurance, unbalanced muscle activation, motor apraxia, decreased coordination and decreased motor planning and decreased sitting balance, decreased standing balance, decreased postural control and hemiplegia and therefore will continue to benefit from skilled OT intervention to enhance overall performance with BADL, iADL, Vocation and Reduce care partner burden.  Patient progressing toward long term goals..  Continue plan of care.  OT Short Term Goals Week 3:  OT Short Term Goal 1 (Week 3): patient will doff/donn OH shirt with min A OT Short Term Goal 1 - Progress (Week 3): Met OT Short Term Goal 2 (Week 3): Patient will doff/donn pants min A using LRAD and complete clothing management with max A OT Short Term Goal 2 - Progress (Week 3): Met OT Short Term Goal 3 (Week 3): patient will complete SB transfer to/from bed, drop arm commode and w/c with mod A of one OT Short Term Goal 3 - Progress (Week 3): Met Week 4:  OT Short Term Goal 1 (Week 4): patient and family will perform hoyer transfer with good carryover and safe practice OT Short Term Goal 2 (Week 4): patient will perform SB transfer min A of one with stand by of 2nd person OT Short Term Goal 3 (Week 4): patient will complete toileting on bedside commode with mod A     Erline Levine A Akoni Parton 09/02/2020, 12:22 PM

## 2020-09-02 NOTE — Progress Notes (Signed)
Occupational Therapy Session Note  Patient Details  Name: Justin Mason MRN: [id number] Date of Birth: [birth year]  Today's Date: 09/02/2020 OT Individual Time: 6438-3818 OT Individual Time Calculation (min): 60 min    Short Term Goals: Week 3:  OT Short Term Goal 1 (Week 3): patient will doff/donn OH shirt with min A OT Short Term Goal 2 (Week 3): Patient will doff/donn pants min A using LRAD and complete clothing management with max A OT Short Term Goal 3 (Week 3): patient will complete SB transfer to/from bed, drop arm commode and w/c with mod A of one  Skilled Therapeutic Interventions/Progress Updates:    Patient in bed, alert, occ one word responses with encouragement.  He denies pain at this time.  Lower body bathing and dressing completed bed level - he is able to wash both legs and feet, donn pants over both legs, clothing management via rolling and bridging with min A.  Dependent to donn teds but he is able to donn both shoes (dependent to tie).  Supine to sitting edge of bed with min A.  SB transfer bed to w/c min A of one with stand by of 2nd.  UB bathing and grooming completed w/c level at sink with min A.  Oral care with set up.  [name] (significant other) arrived toward end of session.  Reviewed positioning of sling for use with hoyer and practiced placement while seated in w/c - [name] demonstrates good understanding.  Patient remained seated in w/c at close of session, call bell and tray table in reach.    Therapy Documentation Precautions:  Precautions Precautions: Fall Precaution Comments: Significant Right sided hemi; intermittent right sided lean Restrictions Weight Bearing Restrictions: No   Therapy/Group: Individual Therapy  Barrie Lyme 09/02/2020, 7:35 AM

## 2020-09-02 NOTE — Progress Notes (Signed)
[facility] PHYSICAL MEDICINE & REHABILITATION PROGRESS NOTE  Subjective/Complaints: Patient seen laying in bed this morning.  He indicates he slept well overnight.  He indicates that he would weekend.  Discussed MRI results with patient again, patient aware.   ROS: limited due to language  Objective: Vital Signs: Blood pressure 135/76, pulse 65, temperature 98.5 F (36.9 C), resp. rate 18, height 6\' 1"  (1.854 m), weight (!) 149 kg, SpO2 97 %. No results found. Recent Labs    09/02/20 0443  WBC 7.8  HGB 12.0*  HCT 35.3*  PLT 259   Recent Labs    09/02/20 0443  NA 138  K 3.9  CL 101  CO2 27  GLUCOSE 131*  BUN 13  CREATININE 0.95  CALCIUM 9.4    Intake/Output Summary (Last 24 hours) at 09/02/2020 1244 Last data filed at 09/02/2020 0804 Gross per 24 hour  Intake 1440 ml  Output --  Net 1440 ml     Pressure Injury 08/09/20 Buttocks Left;Right Deep Tissue Pressure Injury - Purple or maroon localized area of discolored intact skin or blood-filled blister due to damage of underlying soft tissue from pressure and/or shear. (Active)  08/09/20 1716  Location: Buttocks  Location Orientation: Left;Right  Staging: Deep Tissue Pressure Injury - Purple or maroon localized area of discolored intact skin or blood-filled blister due to damage of underlying soft tissue from pressure and/or shear.  Wound Description (Comments):   Present on Admission: Yes    Physical Exam: BP 135/76 (BP Location: Left Arm)   Pulse 65   Temp 98.5 F (36.9 C)   Resp 18   Ht 6\' 1"  (1.854 m)   Wt (!) 149 kg   SpO2 97%   BMI 43.34 kg/m   Constitutional: No distress . Vital signs reviewed.  Morbidly obese. HENT: Normocephalic.  Atraumatic. Eyes: EOMI. No discharge. Cardiovascular: No JVD.  RRR. Respiratory: Normal effort.  No stridor.  Bilateral clear to auscultation. GI: Non-distended.  BS +. Skin: Warm and dry.   Psych: Normal mood.  Normal behavior. Musc:  Right upper extremity edema,  improving Right elbow tenderness, unchanged Left knee TTP, stable Neuro: Alert Expressive aphasia, persistent Nonverbal, stable Motor: RUE 0/5 proximal to 0/5 distal, unchanged RLE: 3 -/5 proximal to distal, stable  Assessment/Plan: 1. Functional deficits which require 3+ hours per day of interdisciplinary therapy in a comprehensive inpatient rehab setting.  Physiatrist is providing close team supervision and 24 hour management of active medical problems listed below.  Physiatrist and rehab team continue to assess barriers to discharge/monitor patient progress toward functional and medical goals   Care Tool:  Bathing    Body parts bathed by patient: Right arm,Left arm,Chest,Abdomen,Right upper leg,Left upper leg,Right lower leg,Left lower leg,Face   Body parts bathed by helper: Left arm     Bathing assist Assist Level: Minimal Assistance - Patient > 75%     Upper Body Dressing/Undressing Upper body dressing   What is the patient wearing?: Pull over shirt    Upper body assist Assist Level: Minimal Assistance - Patient > 75%    Lower Body Dressing/Undressing Lower body dressing      What is the patient wearing?: Pants     Lower body assist Assist for lower body dressing: Contact Guard/Touching assist     Toileting Toileting    Toileting assist Assist for toileting: Total Assistance - Patient < 25% Assistive Device Comment: Bedrails   Transfers Chair/bed transfer  Transfers assist  Chair/bed transfer activity did not  occur: Safety/medical concerns  Chair/bed transfer assist level: 2 Helpers     Locomotion Ambulation   Ambulation assist   Ambulation activity did not occur: Safety/medical concerns          Walk 10 feet activity   Assist  Walk 10 feet activity did not occur: Safety/medical concerns        Walk 50 feet activity   Assist Walk 50 feet with 2 turns activity did not occur: Safety/medical concerns         Walk 150 feet  activity   Assist Walk 150 feet activity did not occur: Safety/medical concerns         Walk 10 feet on uneven surface  activity   Assist Walk 10 feet on uneven surfaces activity did not occur: Safety/medical concerns         Wheelchair     Assist Will patient use wheelchair at discharge?: Yes Type of Wheelchair: Manual    Wheelchair assist level: Minimal Assistance - Patient > 75% Max wheelchair distance: 100    Wheelchair 50 feet with 2 turns activity    Assist        Assist Level: Minimal Assistance - Patient > 75%   Wheelchair 150 feet activity     Assist      Assist Level: Moderate Assistance - Patient 50 - 74%   Medical Problem List and Plan: 1.  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  Continue CIR  WHO/PRAFO nightly--elevate/support RUE while in bed/chair 2.  Antithrombotics: -DVT/anticoagulation:  Pharmaceutical: Continue Lovenox  Dopplers negative for DVT             -antiplatelet therapy: DAPT X 3 months followed by ASA alone (changed to 81)--Plavix added on 08/09/20 3. Pain Management: Mobic DC'd   Right elbow pain:  CT performed and really unremarkable except for chronic anconeus tendinosis  Trial of gabapentin 100mg  tid  Voltaren gel started on 2/23  Appears controlled on 2/28 4. Mood: LCSW to follow for evaluation and support.              -antipsychotic agents: N/A 5. Neuropsych: This patient is not fully capable of making decisions on his own behalf.  Discussed with Neuropsych before/after eval - limited due to language, however, expected coping with loss 6. Skin/Wound Care: Routine pressure relief measures.   Santyl to intergluteal ulcer 7. Fluids/Electrolytes/Nutrition: Monitor I/Os.  8. HTN: Monitor BP tid   HCTZ daily, DC'd on 2/7  Avapro decreased to 150 on 2/8  Catapres twice daily, Lopressor bid.    Controlled on 2/28 9. T2DM with hyperglycemia: Hgb A1c-6.2.   CBG (last 3)  Recent Labs    09/01/20 2110 09/02/20 0607 09/02/20 1145  GLUCAP 150* 126* 144*      Metformin 1000 mg bid  Farxiga restarted  Amaryl restarted on 2/23  Remains elevated on 2/28, will consider further increase in medications tomorrow if persistent 10. Covid 19--Positive  on 07/24/20-->no treatment as asymptomatic. Continue zinc and vitamin C.  11. Morbid obesity: BMI 46. Educate on appropriate diet and exercise to help promote health and mobility.  2/26- BMI down to 43- con't to work on this with diet/exercise              Encourage weight loss 12.  Hyperlipidemia: Crestor 13. H/o gout: continue allopurinol.  14. Hyponatremia: Resolved 15. AKI:   Cr.  0.95 on 2/28  Received IVF x3 nights ended on 2/11   Echo: EF 60-65%  Continue to encourage fluids 16. Elevated liver enzymes: Resolved  ALT within normal limits on 2/11 17. Mild leukocytosis: Resolved  WBCs 8.2 on 2/14  Encourage IS as possible, OOB  See #18 18.  Acute lower UTI  UA +, urine culture showing E. coli  Completed course of Macrobid  20.  Acute blood loss anemia  Hemoglobin 12.0 on 2/28 21.  Left knee pain - acute on chronic  Xray showing OA   MRI personally reviewed showing OA.  Per report complete ACL medial lateral menisci tears in addition to severe OA and lipoma arborescens  See #3  LOS: 24 days A FACE TO FACE EVALUATION WAS PERFORMED  Hatice Bubel Karis Juba 09/02/2020, 12:44 PM

## 2020-09-02 NOTE — Progress Notes (Signed)
Speech Language Pathology Daily Session Note  Patient Details  Name: Justin Mason MRN: [id number] Date of Birth: [birth year]  Today's Date: 09/02/2020 SLP Individual Time: 1300-1345 SLP Individual Time Calculation (min): 45 min  Short Term Goals: Week 4: SLP Short Term Goal 1 (Week 4): STG=LTG (discharge anticipated 3/4)  Skilled Therapeutic Interventions:Skilled ST services focused on education and communication skills. SLP facilitated speech production of initial bilabial CV, CVC and CVCV words. Pt demonstrated accurate motor placement of initial /w/, /p/, /m/ and /b/ words with increase cuing required for initial /f/ words with 75% accuracy and 80% accuracy for initial /t/ words. Pt required mod A sentence completion and visual cues to cease perseveration and to create accurate placement. Pt was unable to accurately produce medial /z/, medial and final /th/ and final /sh/ in words. Pt's vocal intensity remained very low but was audible in close proximity. Education was initiated with pt's girlfriend pertaining to cuing to aid in ceasing preservation and apraxia. Recommend to continue education. Pt was left in room with call bell within reach and chair alarm set. SLP recommends to continue skilled services.     Pain Pain Assessment Pain Scale: 0-10 Pain Score: 0-No pain Pain Type: Chronic pain Pain Location: Knee Pain Orientation: Left Pain Descriptors / Indicators: Discomfort Pain Onset: On-going Pain Intervention(s): Rest  Therapy/Group: Individual Therapy  Ninel Abdella  [facility] 09/02/2020, 3:06 PM

## 2020-09-03 LAB — GLUCOSE, CAPILLARY
Glucose-Capillary: 129 mg/dL — ABNORMAL HIGH (ref 70–99)
Glucose-Capillary: 108 mg/dL — ABNORMAL HIGH (ref 70–99)
Glucose-Capillary: 121 mg/dL — ABNORMAL HIGH (ref 70–99)
Glucose-Capillary: 130 mg/dL — ABNORMAL HIGH (ref 70–99)

## 2020-09-03 MED ORDER — ENOXAPARIN SODIUM 80 MG/0.8ML ~~LOC~~ SOLN
0.5000 mg/kg | SUBCUTANEOUS | Status: DC
  Administered 2020-09-03 – 2020-09-05 (×3): 75 mg via SUBCUTANEOUS
  Filled 2020-09-03 (×3): qty 0.8

## 2020-09-03 MED ORDER — GLIMEPIRIDE 1 MG PO TABS
1.5000 mg | ORAL_TABLET | Freq: Every day | ORAL | Status: DC
  Administered 2020-09-04: 1.5 mg via ORAL
  Filled 2020-09-03: qty 1.5

## 2020-09-03 NOTE — Progress Notes (Signed)
Physical Therapy Session Note  Patient Details  Name: Justin Mason MRN: [id number] Date of Birth: Mar 12, 1981  Today's Date: 09/03/2020 PT Individual Time: 1133-1203 PT Individual Time Calculation (min): 30 min   Short Term Goals: Week 4:  PT Short Term Goal 1 (Week 4): STG = LTG due to ELOS  Skilled Therapeutic Interventions/Progress Updates:    Patient up in w/c with mother and girlfriend, [name], in the room.  They report worked on bathing and dressing earlier with OT.  Report did not get to do hoyer lift due to time.  Patient in chair and educated/demonstrated placement of hoyer lift pad and how to loop it on the lift with [name] helping and showing mother.  Patient lifted up from chair [facility] operating lift and up at highest point would be unable to move to current air bed as not high enough.  So returned to w/c with education on how to get pt back in chair using hands under hips or on thighs while another person lowering.  Also discussed pt evidently now with two areas of breakdown on bottom and need for air mattress on bed at home and pressure relieving cushion on chair (noted equipment delivered with standard foam cushion.  Patient's mom able to place loops correctly on lift and use crank to lift him up and to lower getting involved to help some with pushing legs back for positioning in chair.  She removed loops from lift and with cues and time able to remove pad from behind pt, but needed rest prior to practicing putting legrest on w/c.  Patient left up in chair with family in the room and lap tray on R side, call bell in reach.  Therapy Documentation Precautions:  Precautions Precautions: Fall Precaution Comments: Significant Right sided hemi; intermittent right sided lean Restrictions Weight Bearing Restrictions: No Pain: Pain Assessment Pain Scale: 0-10 Pain Score: 0-No pain Faces Pain Scale: No hurt   Therapy/Group: Individual Therapy  Elray Mcgregor  Sheran Lawless,  PT 09/03/2020, 12:50 PM

## 2020-09-03 NOTE — Progress Notes (Signed)
Nutrition Follow-up  RD working remotely.  DOCUMENTATION CODES:   Morbid obesity  INTERVENTION:   - ContinueEnsure Enlive poBID, each supplement provides 350 kcal and 20 grams of protein  -Continue1 packet Juven BID, each packet provides 95 calories, 2.5 grams of protein, and 9.8 grams of carbohydrate; also contains 7 grams of L-arginine and L-glutamine, 300 mg vitamin C, 15 mg vitamin E, 1.2 mcg vitamin B-12, 9.5 mg zinc, 200 mg calcium, and 1.5 gcalcium Beta-hydroxy-Beta-methylbutyrate to support wound healing  - Continue MVI with minerals daily  NUTRITION DIAGNOSIS:   Increased nutrient needs related to acute illness,wound healing as evidenced by estimated needs.  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 Past medical history significant for morbid obesity, cellulitis of lower extremity, HTN, GERD, HTN, HLD, OSA, and DM2.  Unable to reach pt via phone call to room. PO intake remains excellent with most meal completions 100%. Pt accepting Ensure Enlive and Juven supplements per [facility] documentation. Will continue with current regimen.  CIR admit weight: 159.1 kg Current weight: 149 kg  Weight continues to trend down. Large drop in weight noted from 155 kg on 2/21 to 149 kg on 2/25. Question accuracy.  Meal Completion: 50-100%  Medications reviewed and include: vitamin C, cholecalciferol, farxiga, Ensure Enlive BID, SSI, metformin, MVI with minerals, Juven, protonix  Labs reviewed. CBG's: 108-129 x 24 hours  Diet Order:   Diet Order             Diet Carb Modified Fluid consistency: Thin; Room service appropriate? No  Diet effective now                 EDUCATION NEEDS:   Not appropriate for education at this time  Skin:  Skin Assessment: Skin Integrity Issues: DTI: left; right buttocks Stage II: coccyx Incisions: left arm, Other: MASD; groin, lower abdomen  Last BM:  09/02/20 large type 6  Height:   Ht Readings from Last 1 Encounters:  08/09/20 6\' 1"  (1.854 m)    Weight:   Wt Readings from Last 1 Encounters:  08/30/20 (!) 149 kg    Ideal Body Weight:  83.6 kg  BMI:  Body mass index is 43.34 kg/m.  Estimated Nutritional Needs:   Kcal:  2400-2600  Protein:  130-145  Fluid:  >/= 2.4 L    09/01/20, MS, RD, LDN Inpatient Clinical Dietitian Please see AMiON for contact information.

## 2020-09-03 NOTE — Progress Notes (Signed)
Speech Language Pathology Daily Session Note  Patient Details  Name: Justin Mason MRN: [id number] Date of Birth: 07/27/1980  Today's Date: 09/03/2020 SLP Individual Time: 1047-1130 SLP Individual Time Calculation (min): 43 min  Short Term Goals: Week 4: SLP Short Term Goal 1 (Week 4): STG=LTG (discharge anticipated 3/4)  Skilled Therapeutic Interventions:Skilled ST services focused on education and speech skills. SLP facilitated speech production of CV, CVC and CVCV speech sounds. Pt produced initial /d/ and /n/ words 100% accuracy with mild sentence completion and biofeedback (mirrior) cues for preservation. Pt was able to produce initial /s/ words 100% accuracy with mod A cues for perseveration and initial s blends in 2 out 5 opportunities. Pt was unable to produce initial /z/ words and only 1 out 2 final /z/ words accurately. Pt demonstrated self-correction x3, which was not noted previously by Clinical research associate. SLP provided education to pt's girlfriend and mother on methods of cuing and both family members returned demonstration with sentence completion cues. Pt was left in room with call bell within reach and chair alarm set. SLP recommends to continue skilled services.      Pain Pain Assessment Pain Scale: 0-10 Pain Score: 0-No pain  Therapy/Group: Individual Therapy  Tenoch Mcclure  [facility] 09/03/2020, 12:37 PM

## 2020-09-03 NOTE — Progress Notes (Signed)
Speech Language Pathology Daily Session Note  Patient Details  Name: Justin Mason MRN: [id number] Date of Birth: [birth year]  Today's Date: 09/03/2020 SLP Individual Time: 0093-8182 SLP Individual Time Calculation (min): 23 min  Short Term Goals: Week 4: SLP Short Term Goal 1 (Week 4): STG=LTG (discharge anticipated 3/4)  Skilled Therapeutic Interventions: Pt was seen for skilled ST targeting expressive communication goals. SLP facilitated session with CV, CVC and occasionally CVCV words to continue to work toward accuracy in verbalizations due to apraxia. Pt with 100% accuracy in productions of these words with initial /b/ and /m/ phonemes, and he occasionally extended to a 3-word phrase spontaneously (ex: "read a book" and "bite of apple"). He was aware of phonemic and semantic errors, and occasionally able to self-correct or did so with Min A cues from clinician. Although initial /t/ and /f/ words proved more difficult, pt produced with 5/6 and 3/5 accuracy, respectively, when provided Moderate articulatory placement or phonemic models. Pt left laying in bed with alarm set and needs within reach. Continue per current plan of care.      Pain Pain Assessment Pain Scale: Faces Faces Pain Scale: No hurt  Therapy/Group: Individual Therapy  [name] 09/03/2020, 7:22 AM

## 2020-09-03 NOTE — Progress Notes (Signed)
Occupational Therapy Session Note  Patient Details  Name: Justin Mason MRN: [id number] Date of Birth: [birth year]  Today's Date: 09/03/2020 OT Individual Time: 1000-1045    &   1258-1400 OT Individual Time Calculation (min): 45 min    &    62 min   Short Term Goals: Week 4:  OT Short Term Goal 1 (Week 4): patient and family will perform hoyer transfer with good carryover and safe practice OT Short Term Goal 2 (Week 4): patient will perform SB transfer min A of one with stand by of 2nd person OT Short Term Goal 3 (Week 4): patient will complete toileting on bedside commode with mod A  Skilled Therapeutic Interventions/Progress Updates:    AM session:   Patient in bed, [name] and mom present for therapy sessions/family education today.  Patient denies pain - occ one word responses with encouragement.  Completed LB bathing and dressing bed level - reviewed patient's ability to perform tasks, optimal positioning and assistance needs.  [name] demonstrates good understanding and is facilitating his performance immediately.  Patient able to roll right without assistance using bed rail, he completed all washing of lower body with exception of buttocks.  Max A to place incontinence brief.  He is able to thread both legs into shorts and pull over left hip, min/mod A over right hip.  He is able to donn right ted stocking with mod A, max/dep left, able to donn both shoes.  Side lying to sitting edge of bed with min A.  Good seated balance.  Reviewed positioning and safety with SB transfers - discussed skin safety and to avoid shearing/sliding motion.  Max A SB transfer with [name] as stand by to hold w/c and monitor board position.  Recommend that family only perform SB transfers with therapy at home at this time.  Patient positioned at sink in w/c - patient able to doff OH shirt with CS - [name] and mom to assist with finishing oral care and donning OH shirt.     PM session:   Patient seated in w/c finishing  lunch and ready for afternoon family education session.  Applied kinesiotape to right hand / forearm for edema management and facilitation of wrist extension.  Patient incontinent of urine with brief and shorts wet.  Required use of maxi move to get back to bed due to hoyer not compatible with air mattress.  Reviewed placement of sling in preparation for transfer - mom able to place one side with cues.  Reviewed and demonstrated lift transfer pointing out difference between types of lifts.  Completed hygiene and change of brief - mom encouraged to participate but mostly hands off with adl tasks.  Due to fatigue and timing patient requested to stay bed level - practiced placing sling with forward sit position - [name] able to perform without difficulty - provided cues for back safety and strategies to improve sling position.  Patient remained in bed at close of session.  Recommend ongoing family education and practice with hoyer transfers.       Therapy Documentation Precautions:  Precautions Precautions: Fall Precaution Comments: Significant Right sided hemi; intermittent right sided lean Restrictions Weight Bearing Restrictions: No   Therapy/Group: Individual Therapy  Barrie Lyme 09/03/2020, 7:36 AM

## 2020-09-03 NOTE — Progress Notes (Signed)
[facility] PHYSICAL MEDICINE & REHABILITATION PROGRESS NOTE  Subjective/Complaints: Patient seen laying in bed this morning. He indicates he slept well overnight. He denies complaints.   ROS: limited due to language  Objective: Vital Signs: Blood pressure (!) 147/82, pulse 70, temperature 98.7 F (37.1 C), temperature source Oral, resp. rate 18, height 6\' 1"  (1.854 m), weight (!) 149 kg, SpO2 98 %. No results found. Recent Labs    09/02/20 0443  WBC 7.8  HGB 12.0*  HCT 35.3*  PLT 259   Recent Labs    09/02/20 0443  NA 138  K 3.9  CL 101  CO2 27  GLUCOSE 131*  BUN 13  CREATININE 0.95  CALCIUM 9.4    Intake/Output Summary (Last 24 hours) at 09/03/2020 1101 Last data filed at 09/03/2020 0743 Gross per 24 hour  Intake 830 ml  Output -  Net 830 ml     Pressure Injury 08/09/20 Buttocks Left;Right Deep Tissue Pressure Injury - Purple or maroon localized area of discolored intact skin or blood-filled blister due to damage of underlying soft tissue from pressure and/or shear. (Active)  08/09/20 1716  Location: Buttocks  Location Orientation: Left;Right  Staging: Deep Tissue Pressure Injury - Purple or maroon localized area of discolored intact skin or blood-filled blister due to damage of underlying soft tissue from pressure and/or shear.  Wound Description (Comments):   Present on Admission: Yes    Physical Exam: BP (!) 147/82 (BP Location: Left Arm)   Pulse 70   Temp 98.7 F (37.1 C) (Oral)   Resp 18   Ht 6\' 1"  (1.854 m)   Wt (!) 149 kg   SpO2 98%   BMI 43.34 kg/m   Constitutional: No distress . Vital signs reviewed. Morbidly obese. HENT: Normocephalic.  Atraumatic. Eyes: EOMI. No discharge. Cardiovascular: No JVD.  RRR. Respiratory: Normal effort.  No stridor.  Bilateral clear to auscultation. GI: Non-distended.  BS +. Skin: Warm and dry.  Intact. Psych: Normal mood.  Normal behavior. Musc:  Right upper extremity edema, stable Right elbow tenderness,  unchanged Left knee TTP, stable Neuro: Alert Expressive aphasia, persistent Nonverbal, stable Motor: RUE 0/5 proximal to 0/5 distal, stable RLE: 3 -/5 proximal to distal, stable  Assessment/Plan: 1. Functional deficits which require 3+ hours per day of interdisciplinary therapy in a comprehensive inpatient rehab setting.  Physiatrist is providing close team supervision and 24 hour management of active medical problems listed below.  Physiatrist and rehab team continue to assess barriers to discharge/monitor patient progress toward functional and medical goals   Care Tool:  Bathing    Body parts bathed by patient: Right arm,Left arm,Chest,Abdomen,Right upper leg,Left upper leg,Right lower leg,Left lower leg,Face   Body parts bathed by helper: Left arm     Bathing assist Assist Level: Minimal Assistance - Patient > 75%     Upper Body Dressing/Undressing Upper body dressing   What is the patient wearing?: Pull over shirt    Upper body assist Assist Level: Minimal Assistance - Patient > 75%    Lower Body Dressing/Undressing Lower body dressing      What is the patient wearing?: Pants     Lower body assist Assist for lower body dressing: Contact Guard/Touching assist     Toileting Toileting    Toileting assist Assist for toileting: Total Assistance - Patient < 25% Assistive Device Comment: Bedrails   Transfers Chair/bed transfer  Transfers assist  Chair/bed transfer activity did not occur: Safety/medical concerns  Chair/bed transfer assist level: Moderate  Assistance - Patient 50 - 74%     Locomotion Ambulation   Ambulation assist   Ambulation activity did not occur: Safety/medical concerns          Walk 10 feet activity   Assist  Walk 10 feet activity did not occur: Safety/medical concerns        Walk 50 feet activity   Assist Walk 50 feet with 2 turns activity did not occur: Safety/medical concerns         Walk 150 feet  activity   Assist Walk 150 feet activity did not occur: Safety/medical concerns         Walk 10 feet on uneven surface  activity   Assist Walk 10 feet on uneven surfaces activity did not occur: Safety/medical concerns         Wheelchair     Assist Will patient use wheelchair at discharge?: Yes Type of Wheelchair: Manual    Wheelchair assist level: Minimal Assistance - Patient > 75% Max wheelchair distance: 100    Wheelchair 50 feet with 2 turns activity    Assist        Assist Level: Minimal Assistance - Patient > 75%   Wheelchair 150 feet activity     Assist      Assist Level: Moderate Assistance - Patient 50 - 74%   Medical Problem List and Plan: 1.  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  Continue CIR  WHO/PRAFO nightly--elevate/support RUE while in bed/chair 2.  Antithrombotics: -DVT/anticoagulation:  Pharmaceutical: Continue Lovenox  Dopplers negative for DVT             -antiplatelet therapy: DAPT X 3 months followed by ASA alone (changed to 81)--Plavix added on 08/09/20 3. Pain Management: Mobic DC'd   Right elbow pain:  CT performed and really unremarkable except for chronic anconeus tendinosis  Trial of gabapentin 100mg  tid  Voltaren gel started on 2/23  Controlled on 3/1 with medications 4. Mood: LCSW to follow for evaluation and support.              -antipsychotic agents: N/A 5. Neuropsych: This patient is not fully capable of making decisions on his own behalf.  Discussed with Neuropsych before/after eval - limited due to language, however, expected coping with loss 6. Skin/Wound Care: Routine pressure relief measures.   Santyl to intergluteal ulcer 7. Fluids/Electrolytes/Nutrition: Monitor I/Os.  8. HTN: Monitor BP tid   HCTZ daily, DC'd on 2/7  Avapro decreased to 150 on 2/8  Catapres twice daily,  Lopressor bid.   Slightly elevated on 2/1, will consider resuming medications if persistent 9. T2DM with hyperglycemia: Hgb A1c-6.2.   CBG (last 3)  Recent Labs    09/02/20 1649 09/02/20 2058 09/03/20 0604  GLUCAP 116* 117* 129*      Metformin 1000 mg bid  Farxiga restarted  Amaryl restarted on 2/23, increased to 1.5 on 3/2 10. Covid 19--Positive  on 07/24/20-->no treatment as asymptomatic. Continue zinc and vitamin C.  11. Morbid obesity: BMI 46. Educate on appropriate diet and exercise to help promote health and mobility.  2/26- BMI down to 43- con't to work on this with diet/exercise              Encourage weight loss 12.  Hyperlipidemia: Crestor 13. H/o gout: continue allopurinol.  14. Hyponatremia: Resolved 15. AKI:   Cr.  0.95 on 2/28  Received IVF x3 nights ended  on 2/11   Echo: EF 60-65%  Continue to encourage fluids 16. Elevated liver enzymes: Resolved  ALT within normal limits on 2/11 17. Mild leukocytosis: Resolved  WBCs 8.2 on 2/14  Encourage IS as possible, OOB  See #18 18.  Acute lower UTI  UA +, urine culture showing E. coli  Completed course of Macrobid  20.  Acute blood loss anemia  Hemoglobin 12.0 on 2/28 21.  Left knee pain - acute on chronic  Xray showing OA   MRI personally reviewed showing OA.  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

## 2020-09-04 LAB — GLUCOSE, CAPILLARY
Glucose-Capillary: 143 mg/dL — ABNORMAL HIGH (ref 70–99)
Glucose-Capillary: 46 mg/dL — ABNORMAL LOW (ref 70–99)
Glucose-Capillary: 93 mg/dL (ref 70–99)
Glucose-Capillary: 166 mg/dL — ABNORMAL HIGH (ref 70–99)
Glucose-Capillary: 137 mg/dL — ABNORMAL HIGH (ref 70–99)

## 2020-09-04 MED ORDER — INSULIN ASPART 100 UNIT/ML ~~LOC~~ SOLN
0.0000 [IU] | Freq: Three times a day (TID) | SUBCUTANEOUS | Status: DC
  Administered 2020-09-04 – 2020-09-05 (×2): 2 [IU] via SUBCUTANEOUS

## 2020-09-04 MED ORDER — ACETAMINOPHEN 325 MG PO TABS: 325.0000 mg | ORAL_TABLET | ORAL | Status: AC | PRN

## 2020-09-04 MED ORDER — INSULIN ASPART 100 UNIT/ML ~~LOC~~ SOLN: 0.0000 [IU] | Freq: Every day | SUBCUTANEOUS | Status: DC

## 2020-09-04 MED ORDER — GLIMEPIRIDE 2 MG PO TABS
1.0000 mg | ORAL_TABLET | Freq: Every day | ORAL | Status: DC
  Administered 2020-09-05 – 2020-09-06 (×2): 1 mg via ORAL
  Filled 2020-09-04 (×2): qty 1

## 2020-09-04 NOTE — Progress Notes (Signed)
Recreational Therapy Session Note  Patient Details  Name: Justin Mason MRN: [id number] Date of Birth: 12/30/1980 Today's Date: 09/04/2020  Pain: no c/o Skilled Therapeutic Interventions/Progress Updates: Met with pt, mom & girlfriend briefly today to discuss leisure interests, activity analysis & potential modifications.  Discussed the importance of addressing physical, emotional, social, cognitive and spiritual health.  All stated understanding and appreciation of the information.  Therapy/Group: Individual Therapy   SIMPSON,LISA 09/04/2020, 3:11 PM

## 2020-09-04 NOTE — Progress Notes (Signed)
Occupational Therapy Discharge Summary  Patient Details  Name: Justin Mason MRN: [id number] Date of Birth: [birth year]     Patient has met 6 of 10 long term goals due to improved activity tolerance, improved balance, postural control, functional use of  RIGHT upper and RIGHT lower extremity and improved coordination.  Patient to discharge at overall Mod Assist level.  Patient's care partner is independent to provide the necessary physical and cognitive assistance at discharge.    Reasons goals not met: patient making gains in all areas but limited by body habitus, ongoing significant right side motor control deficits, left knee pain and instability, sacral wound.    Recommendation:  Patient will benefit from ongoing skilled OT services in home health setting to continue to advance functional skills in the area of BADL and Reduce care partner burden.   Recommend  that family use lift for transfers and continue to practice transfer board mobility with therapy pending skin stability and pain.    Equipment: bariatric drop arm commode, hospital bed with air mattress, hoyer lift with toileting sling  Reasons for discharge: treatment goals met and family able to provide assistance with transfers utilizing lift due to inability to meet standing goals.    Patient/family agrees with progress made and goals achieved: Yes  OT Discharge Precautions/Restrictions  Precautions Precautions: Fall Precaution Comments: right side weakness, left knee pain and instability Restrictions Weight Bearing Restrictions: No ADL ADL Eating: Set up Where Assessed-Eating: Wheelchair Grooming: Setup Where Assessed-Grooming: Wheelchair,Sitting at sink Upper Body Bathing: Minimal assistance Where Assessed-Upper Body Bathing: Sitting at sink,Wheelchair Lower Body Bathing: Minimal assistance Where Assessed-Lower Body Bathing: Bed level Upper Body Dressing: Minimal assistance Where Assessed-Upper Body Dressing:  Wheelchair Lower Body Dressing: Minimal assistance Where Assessed-Lower Body Dressing: Bed level Toileting: Maximal assistance Where Assessed-Toileting: Bed level Toilet Transfer: Other (comment) (SB transfers +2, patient incontinent and with difficulty communicating need in time) Armed forces technical officer Method: Theatre manager: Extra wide drop arm bedside commode ADL Comments: unable to perform shower transfers safely at this time due to pain in left knee and body habitus Vision Baseline Vision/History: No visual deficits Patient Visual Report: No change from baseline Vision Assessment?: No apparent visual deficits Perception  Perception: Within Functional Limits Praxis Praxis: Impaired Praxis Impairment Details: Motor planning Cognition Overall Cognitive Status: Impaired/Different from baseline Arousal/Alertness: Awake/alert Orientation Level: Oriented to person;Oriented to place;Oriented to situation Problem Solving: Impaired Safety/Judgment: Impaired Comments: able to respond yes/no consistently, occ one word verbal responses Sensation Sensation Proprioception Impaired Details: Impaired RUE Coordination Gross Motor Movements are Fluid and Coordinated: No Fine Motor Movements are Fluid and Coordinated: No Finger Nose Finger Test: left WNL, right unable Motor  Motor Motor: Hemiplegia;Abnormal tone Motor - Discharge Observations: trace/quarter proximal UE return, continues with no distal motor function - right hand edema and joint stiffness Mobility  Bed Mobility Rolling Right: Contact Guard/Touching assist Rolling Left: Moderate Assistance - Patient 50-74% Supine to Sit: Moderate Assistance - Patient 50-74% Sit to Supine: Maximal Assistance - Patient 25-49%  Trunk/Postural Assessment  Cervical Assessment Cervical Assessment: Within Functional Limits  Balance Static Sitting Balance Static Sitting - Level of Assistance: 5: Stand by assistance Dynamic  Sitting Balance Dynamic Sitting - Level of Assistance: 5: Stand by assistance Extremity/Trunk Assessment RUE Assessment Passive Range of Motion (PROM) Comments: WFL t/o with scapular assist Active Range of Motion (AROM) Comments: scapula 1/2 -3/4, trace shoulder/elbow/forearm, 0 wrist/hand, occ volitional movement of thumb but inconsistent. General Strength Comments: 1-2 proximal, 0  distal LUE Assessment LUE Assessment: Within Functional Limits   Carlos Levering 09/04/2020, 4:42 PM

## 2020-09-04 NOTE — Progress Notes (Signed)
Hypoglycemic Event  CBG: *46  Treatment: 8OZ ORANGE JUICE  Symptoms: None  Follow-up CBG: Time:1209 CBG Result:*93  Possible Reasons for Event: Medication regimen: GLIPIDZIDE DECREASED TO 1MG   Comments/MD notified: DR. PATEL    

## 2020-09-04 NOTE — Progress Notes (Signed)
Speech Language Pathology Daily Session Note  Patient Details  Name: Justin Mason MRN: [id number] Date of Birth: 07-18-1980  Today's Date: 09/04/2020 SLP Individual Time: 1015-1100 SLP Individual Time Calculation (min): 45 min  Short Term Goals: Week 4: SLP Short Term Goal 1 (Week 4): STG=LTG (discharge anticipated 3/4)  Skilled Therapeutic Interventions:   Patient seen with girlfriend and Mom both present for education in anticipation of discharge 3/4. SLP discussed home exercises/activities and emphasized importance of focusing on functional communication and encouraging patient to use his voice. Mom asked about AAC device as we had discussed that previously. SLP recommended continuing to focus on verbal communication but that [facility] SLP could continue to determine if AAC would be beneficial to patient. Patient able to voice and verbalize at word level to tell SLP what he had for breakfast "scrambled eggs and pancakes". He was able to voice and verbalize to read CV(consonant-vowel) words with initial consonants /b/, /p/, /n/, /t/ and was able to switch between different initial consonant phonemes with less cueing as compared to previous sessions. Patient continues to benefit from skilled SLP intervention to maximize speech-language function prior to discharge.   Pain Pain Assessment Pain Scale: 0-10 Pain Score: 0-No pain  Therapy/Group: Individual Therapy  Angela Nevin, MA, CCC-SLP Speech Therapy

## 2020-09-04 NOTE — Progress Notes (Signed)
Physical Therapy Session Note  Patient Details  Name: Justin Mason MRN: [id number] Date of Birth: April 06, 1981  Today's Date: 09/04/2020 PT Individual Time: 1300-1400 PT Individual Time Calculation (min): 60 min   Short Term Goals: Week 4:  PT Short Term Goal 1 (Week 4): STG = LTG due to ELOS  Skilled Therapeutic Interventions/Progress Updates:    Patient in w/c with girlfriend, [name], and mother present.  Reported he has had a rough day since low CBG this am and just had nose bleed in addition to family running into his L foot with pain.  Provided ace wrap to L knee for comfort, support and edema control.  Patient indicated he was soiled with urine in the w/c.  Placed lift pad under pt with [name] assisting and used maximove lift to transfer to bed since pants soiled.  Patient in supine bridging up for removing shorts and soiled brief, rolling for hygiene with [name] assisting and to apply new brief with [facility] teaching mom about technique.  Patient in supine rolled to remove maximove pad and for mom to place hoyer lift pad with min cues and [name] assisted when rolling L and teaching mother about positioning for the legs and how Deke can assist as well with his R LE.  Mother able to hook straps up to hoyer lift once lift in place.  She performed correctly with cues to elevate [name] for ease of placing pad on lift.  She performed lift up from bed, but lift does not go up high enough for transfer to the chair so she lowered pt safely to the bed and was able to remove lift on her own and pad with [name]'s help.  Patient in supine and demonstrated to family LE therex for HEP including hamstring stretch, glut stretch, heel cord stretch, bridging, trunk rotation and hip adductor squeezes with ball.  All issued for HEP.  Patient left in supine with family in the room and needs in reach.  Therapy Documentation Precautions:  Precautions Precautions: Fall Precaution Comments: right side weakness, left knee  pain and instability Restrictions Weight Bearing Restrictions: No Pain: Pain Assessment Pain Scale: Faces Pain Score: 0-No pain Faces Pain Scale: Hurts even more Pain Type: Acute pain Pain Location: Foot Pain Orientation: Left Pain Descriptors / Indicators: Tender Pain Onset: With Activity Pain Intervention(s): Repositioned   Therapy/Group: Individual Therapy  Elray Mcgregor  [place], [place] 09/04/2020, 4:42 PM

## 2020-09-04 NOTE — Progress Notes (Signed)
Patient ID: Justin Mason, male   DOB: Jul 26, 1980, 40 y.o.   MRN: [id number]  Met with pt, Mom and girlfriend to discuss team conference progress this week in therapies and discharge 3/4. Both Mom and girlfriend have been in all week for on-going family education in prepartion for discharge Friday. Mom to go home this afternoon to receive the equipment being delivered. Aware lap tray and slide board will be shipped to the home due to out of stock.  Pt limited by his prior knee and shoulder issues from previous injuries. Pt brighter knowing he is going home Friday. Pt will be much care and high risk to be re-admitted to the hospital.

## 2020-09-04 NOTE — Patient Care Conference (Signed)
Inpatient RehabilitationTeam Conference and Plan of Care Update Date: 09/04/2020   Time: 11:46 AM    Patient Name: Justin Mason      Medical Record Number: [id number]  Date of Birth: 01/28/1981 Sex: Male         Room/Bed: 4W02C/4W02C-01 Payor Info: Payor: [employer] / Plan: CIGNA MANAGED / Product Type: *No Product type* /    Admit Date/Time:  08/09/2020  3:32 PM  Primary Diagnosis:  Acute cerebral infarction [facility])  Hospital Problems: Principal Problem:   Acute cerebral infarction [facility]) Active Problems:   Pressure injury of skin   Hyponatremia   AKI (acute kidney injury) (HCC)   Transaminitis   Benign essential HTN   Labile blood pressure   Right hemiplegia (HCC)   Acute lower UTI   Right hemiparesis (HCC)   Right elbow pain   Acute blood loss anemia   History of hypertension   Chronic pain of right knee   Expressive aphasia   Chronic pain of left knee    Expected Discharge Date: Expected Discharge Date: 09/06/20  Team Members Present: Physician leading conference: Dr. Sula Soda Care Coodinator Present: Chana Bode, RN, BSN, CRRN;Becky Dupree, LCSW Nurse Present: Other (comment) Rae Lips, RN) PT Present: Sheran Lawless, PT OT Present: Towanda Malkin, OT SLP Present: Colin Benton, SLP PPS Coordinator present : Fae Pippin, [place], PT     Current Status/Progress Goal Weekly Team Focus  Bowel/Bladder   Pt. is incontinent B/B. LBM 09/03/20  Pt. will maintain normal B/B pattern  Toilet pt. every two hours and as needed on every shift.   Swallow/Nutrition/ Hydration   regualr textures min-supervision A  Supervision  education use of swallow strategies   ADL's   min A for LB bathing/dressing bed level, min A UB bathing/dressing w/c level, mod A of one with stand by of 2nd for SB transfer, ongoing education needed for family with hoyer transfers  [employer] adl, Min/mod A SB transfer, family to perform hoyer transfers independently  Futures trader,  NMRE, family education   Mobility   min a on mat for rolling and side to sit, slide board transfers mod A of 1, sit to partial stand with EVA mod A of 1 from higher surface  mod A slide board transfers, +2 for standing, car transfers, w/c mobility min A 50'  family training on hoyer and for slide board transfers, w/c management and positioning for skin protection, anterior weight shifts to offload and improve slide board transfers   Communication   Mod-Min A, phonation at word and very simple phrase level, still low vocal intensity/corrdinating breath support  MIn A  corrdinating breath, artic drills at word level   Safety/Cognition/ Behavioral Observations  Min A  Supervision A  orientation, education   Pain   Pt. pain is currentily well managed with ordered  PRN medications  Assess pt. for pain on every shift and PRN  Notify MD if pt. pain is not relief with current PRN PAIN MEDICATION   Skin   Pt. has stage 2 pressure injury on the cocyx currently being treated with santyl with foam dressing daily and as needed.  Pt. will maintain skin intergrity with no further breakdown.  Assess pt. for skin breakdown on every shiift and as needed.     Discharge Planning:  Family has been here this week for on-going education in prepartion for discharge 3/4. Home health in place and equipment being delivered prior to DC home   Team Discussion: Slide-board  transfers improved to mod assist. Requires cues for foot placement and weight shifting. Diffuse pain limits progress; left knee pain and right sided weakness. Verbal communication improved Patient on target to meet rehab goals: yes  *See Care Plan and progress notes for long and short-term goals.   Revisions to Treatment Plan:   Teaching Needs: Transfers, body mechanics for helpers during transfers and toileting, medications, secondary stroke risks management, etc.   Current Barriers to Discharge: Decreased caregiver support, Weight and  Insurance coverage for [facility]  Possible Resolutions to Barriers: HH follow up recommended/accquired Family education with girlfriend and mom DME including hospital bed with air mattress    Medical Summary Current Status: Hypoglycemic to 46 this morning after increase in Glipizide, diffuse aches and pains, new left lateral foot pain  Barriers to Discharge: Medical stability  Barriers to Discharge Comments: Hypoglycemic to 46 this morning after increase in Glipizide, diffuse aches and pains, new left lateral foot pain Possible Resolutions to [employer] Focus: Decrease Glipizide back to 1mg , advised blue emu oil and anti-inflammatory foods upon discharge, discussed risks and benefits of oral pain medications as well   Continued Need for Acute Rehabilitation Level of Care: The patient requires daily medical management by a physician with specialized training in physical medicine and rehabilitation for the following reasons: Direction of a multidisciplinary physical rehabilitation program to maximize functional independence : Yes Medical management of patient stability for increased activity during participation in an intensive rehabilitation regime.: Yes Analysis of laboratory values and/or radiology reports with any subsequent need for medication adjustment and/or medical intervention. : Yes   I attest that I was present, lead the team conference, and concur with the assessment and plan of the team.   B 09/04/2020, 4:08 PM

## 2020-09-04 NOTE — Progress Notes (Signed)
Occupational Therapy Session Note  Patient Details  Name: Justin Mason MRN: [id number] Date of Birth: 17-Jun-1981  Today's Date: 09/04/2020 OT Individual Time: 1100-1135   &   1500-1545 OT Individual Time Calculation (min): 35 min    &  45 min   Short Term Goals: Week 4:  OT Short Term Goal 1 (Week 4): patient and family will perform hoyer transfer with good carryover and safe practice OT Short Term Goal 2 (Week 4): patient will perform SB transfer min A of one with stand by of 2nd person OT Short Term Goal 3 (Week 4): patient will complete toileting on bedside commode with mod A  Skilled Therapeutic Interventions/Progress Updates:    AM session:   Patient in bed, alert and ready for therapy session.  Significant other, hannah and mom present for ongoing family education.  Finished LB dressing with assistance due to timing.  Side lying to sitting edge of bed with min/mod A.  [name] in front for SB transfer bed to w/c with max A of 2 and cues for body position/safe strategy.  Encouraged family to perform SB at home with therapy support.  Mom practiced placing hoyer sling at w/c level with min cues.  Placed hoyer sling on lift and elevated patient from w/c - discussed and practiced return to w/c and assist for positioning.  He remained in w/c at close of session.     PM session:   Patient in bed, alert and able to say some words/phrases.  Ice pack provided for left foot due to pain.  [name] present for session.  Provided and reviewed right arm home exercise program this session.  [name] demonstrates good understanding.  Reinforced safety and plan for mobility in home.  Patient remained in bed at close of session.      Therapy Documentation Precautions:  Precautions Precautions: Fall Precaution Comments: Significant Right sided hemi; intermittent right sided lean Restrictions Weight Bearing Restrictions: No  Therapy/Group: Individual Therapy  Barrie Lyme 09/04/2020, 7:39 AM

## 2020-09-04 NOTE — Discharge Summary (Signed)
Physician Discharge Summary  Patient ID: Justin Mason MRN: [id number] DOB/AGE: [birth year] 40 y.o.  Admit date: 08/09/2020 Discharge date: 09/06/2020  Discharge Diagnoses:  Active Problems:   Pressure injury of skin   Hyponatremia   AKI (acute kidney injury) ([place])   Benign essential HTN   Right hemiplegia (HCC)   Right hemiparesis (HCC)   Right elbow pain   Acute blood loss anemia   Chronic pain of right knee   Expressive aphasia   Chronic pain of left knee   Gout flare   Type 2 diabetes mellitus ([place])   Discharged Condition: stable   Significant Diagnostic Studies: DG Forearm Right  Result Date: 08/21/2020 CLINICAL DATA:  Pain in the posterior side of the right forearm. EXAM: RIGHT FOREARM - 2 VIEW COMPARISON:  None. FINDINGS: There is nonspecific soft tissue swelling about the forearm. There is no acute displaced fracture or dislocation. There are subtle cortical erosions involving the proximal ulna. There are degenerative changes of the elbow. There is no radiopaque foreign body. IMPRESSION: 1. Soft tissue swelling without evidence for an acute displaced fracture or dislocation. 2. Subtle cortical erosions of the proximal ulna. If there is clinical concern for osteomyelitis, follow-up with a contrast-enhanced MRI is recommended. 3. Degenerative changes are noted of the elbow. 4. No radiopaque foreign body. Electronically Signed   By: Constance Holster M.D.   On: 08/21/2020 16:56   CT HEAD WO CONTRAST  Result Date: 08/11/2020 CLINICAL DATA:  Neuro deficit, acute stroke suspected. EXAM: CT HEAD WITHOUT CONTRAST TECHNIQUE: Contiguous axial images were obtained from the base of the skull through the vertex without intravenous contrast. COMPARISON:  MRI 07/25/2020. FINDINGS: Brain: Cortical and subcortical hypoattenuation involving the left frontal lobe, compatible with evolving left MCA territory infarct. No definite new/interval acute infarct. Mild areas of cortical hyperdensity may  represent petechial hemorrhage. No mass occupying hemorrhagic transformation. No substantial mass effect. No midline shift. Basal cisterns are patent. Similar tiny focus of hypodensity within the left caudate head. Similar incidental small retrocerebellar arachnoid cyst. Vascular: Calcific atherosclerosis. Skull: No acute fracture. Sinuses/Orbits: Mild ethmoid air cell mucosal thickening. Otherwise clear sinuses. Unremarkable orbits. Other: No mastoid effusions. IMPRESSION: Evolving left MCA territory infarct. Mild areas of cortical hyperdensity may represent petechial hemorrhage. No mass occupying hemorrhagic transformation or substantial mass effect. No definite new/interval acute infarct, although MRI could provide more sensitive evaluation for extension of infarct. Electronically Signed   By: Margaretha Sheffield MD   On: 08/11/2020 14:57   CT FOREARM RIGHT W CONTRAST  Result Date: 08/24/2020 CLINICAL DATA:  Right forearm pain and swelling. Question osteomyelitis. EXAM: CT OF THE UPPER RIGHT EXTREMITY WITH CONTRAST TECHNIQUE: Multidetector CT imaging of the upper right extremity was performed according to the standard protocol following intravenous contrast administration. CONTRAST:  100 mL OMNIPAQUE IOHEXOL 300 MG/ML  SOLN COMPARISON:  Plain films right forearm 08/21/2020. FINDINGS: Bones/Joint/Cartilage There is no evidence of osteomyelitis. Mild cortical irregularity of the dorsal margin of the proximal ulna seen on prior plain films is in the region of the anconeus tendon insertion and likely due to chronic, mild tug lesion or tendinosis. Subtle calcifications are seen in the distal anconeus tendon. 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 Small spur at the triceps tendon insertion and a small calcification in the distal triceps tendon noted. No joint effusion. Ligaments Suboptimally assessed by CT.  Muscles and Tendons Intact. No intramuscular fluid  collection. No gas within muscle or tracking along fascial planes. As described above. Soft tissues Head no focal fluid collection or mass. There is some subcutaneous edema about the dorsal aspect of the forearm. IMPRESSION: Negative for osteomyelitis. 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 Mild stranding in subcutaneous tissues of the forearm may be due to cellulitis or dependent change. Negative for abscess, myositis or septic joint. 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 Electronically Signed   By: Inge Rise M.D.   On: 08/24/2020 17:55   MR KNEE LEFT W WO CONTRAST  Result Date: 08/30/2020 CLINICAL DATA:  Chronic left knee pain and instability. No known injury. EXAM: MRI OF THE LEFT KNEE WITHOUT AND WITH CONTRAST TECHNIQUE: Multiplanar, multisequence MR imaging of the knee was performed before and after the administration of intravenous contrast. CONTRAST:  10 mL GADAVIST IV SOLN COMPARISON:  Plain films left knee 08/28/2020. FINDINGS: MENISCI Medial meniscus: There is a radial tear through the root of the posterior horn. Lateral meniscus: Severe complex tearing is seen in the posterior horn. The majority of the anterior horn is not visualized consistent with degenerative maceration. LIGAMENTS Cruciates:  Chronic, complete ACL tear.  The PCL is intact. Collaterals:  Intact. CARTILAGE Patellofemoral:  Severely degenerated. Medial:  Severely degenerated. Lateral:  Severely degenerated. Joint: Small joint effusion. 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 Popliteal Fossa:  Very small Baker's cyst. Extensor Mechanism:  Intact. Bones:  Bulky tricompartmental osteophytosis. Other: None. 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 Tearing of both the medial and lateral menisci as described  above. Lipoma arborescens. Electronically Signed   By: Inge Rise M.D.   On: 08/30/2020 08:27   DG Knee Complete 4 Views Left  Result Date: 08/28/2020 CLINICAL DATA:  Knee pain EXAM: LEFT KNEE - COMPLETE 4+ VIEW COMPARISON:  None. FINDINGS: Moderate joint effusion. Tricompartmental osteoarthritis with joint space narrowing and marginal osteophyte formation. No evidence of fracture. Possible small loose bodies visible on the lateral view. No focal bone finding otherwise. IMPRESSION: Tricompartmental osteoarthritis with moderate joint effusion and possible small loose bodies. Electronically Signed   By: Nelson Chimes M.D.   On: 08/28/2020 18:29   VAS [place] LOWER EXTREMITY VENOUS (DVT)  Result Date: 08/10/2020  Lower Venous DVT Study Indications: Swelling.  Risk Factors: Surgery Back. Comparison Study: Prev 2016 negative Performing Technologist: Vonzell Schlatter RVT  Examination Guidelines: A complete evaluation includes B-mode imaging, spectral Doppler, color Doppler, and power Doppler as needed of all accessible portions of each vessel. Bilateral testing is considered an integral part of a complete examination. Limited examinations for reoccurring indications may be performed as noted. The reflux portion of the exam is performed with the patient in reverse Trendelenburg.  +---------+---------------+---------+-----------+----------+--------------+ RIGHT    CompressibilityPhasicitySpontaneityPropertiesThrombus Aging +---------+---------------+---------+-----------+----------+--------------+ CFV      Full           Yes      Yes                                 +---------+---------------+---------+-----------+----------+--------------+ SFJ      Full                                                        +---------+---------------+---------+-----------+----------+--------------+  FV Prox  Full                                                         +---------+---------------+---------+-----------+----------+--------------+ FV Mid   Full                                                        +---------+---------------+---------+-----------+----------+--------------+ FV DistalFull                                                        +---------+---------------+---------+-----------+----------+--------------+ PFV      Full                                                        +---------+---------------+---------+-----------+----------+--------------+ POP      Full           Yes      Yes                                 +---------+---------------+---------+-----------+----------+--------------+ PTV      Full                                                        +---------+---------------+---------+-----------+----------+--------------+ PERO     Full                                                        +---------+---------------+---------+-----------+----------+--------------+   +---------+---------------+---------+-----------+----------+--------------+ LEFT     CompressibilityPhasicitySpontaneityPropertiesThrombus Aging +---------+---------------+---------+-----------+----------+--------------+ CFV      Full           Yes      Yes                                 +---------+---------------+---------+-----------+----------+--------------+ SFJ      Full                                                        +---------+---------------+---------+-----------+----------+--------------+ FV Prox  Full                                                        +---------+---------------+---------+-----------+----------+--------------+  FV Mid   Full                                                        +---------+---------------+---------+-----------+----------+--------------+ FV DistalFull                                                         +---------+---------------+---------+-----------+----------+--------------+ PFV      Full                                                        +---------+---------------+---------+-----------+----------+--------------+ POP      Full           Yes      Yes                                 +---------+---------------+---------+-----------+----------+--------------+ PTV      Full                                                        +---------+---------------+---------+-----------+----------+--------------+ PERO     Full                                                        +---------+---------------+---------+-----------+----------+--------------+   Summary: BILATERAL: - No evidence of deep vein thrombosis seen in the lower extremities, bilaterally. -No evidence of popliteal cyst, bilaterally.   *See table(s) above for measurements and observations. Electronically signed by Monica Martinez MD on 08/10/2020 at 12:36:13 PM.    Final     Labs: Basic metabolic panel. BMP Latest Ref Rng & Units 09/02/2020 08/26/2020 08/23/2020  Glucose 70 - 99 mg/dL 131(H) 147(H) 148(H)  BUN 6 - 20 mg/dL $Remove'13 12 13  'YpzaLtO$ Creatinine 0.61 - 1.24 mg/dL 0.95 0.90 0.88  Sodium 135 - 145 mmol/L 138 136 134(L)  Potassium 3.5 - 5.1 mmol/L 3.9 3.7 3.7  Chloride 98 - 111 mmol/L 101 99 99  CO2 22 - 32 mmol/L $RemoveB'27 27 24  'DVwjfPMs$ Calcium 8.9 - 10.3 mg/dL 9.4 9.1 8.9    Hepatic Function Latest Ref Rng & Units 08/16/2020 08/10/2020 07/30/2020  Total Protein 6.5 - 8.1 g/dL 6.5 7.1 6.3(L)  Albumin 3.5 - 5.0 g/dL 3.3(L) 3.6 2.9(L)  AST 15 - 41 U/L $Remo'17 29 29  'UMHAN$ ALT 0 - 44 U/L 31 61(H) 51(H)  Alk Phosphatase 38 - 126 U/L 42 51 40  Total Bilirubin 0.3 - 1.2 mg/dL 1.0 1.3(H) 1.0    CBC: CBC Latest Ref Rng & Units 09/02/2020 08/26/2020 08/19/2020  WBC 4.0 - 10.5 K/uL 7.8 8.8 8.2  Hemoglobin 13.0 - 17.0 g/dL 12.0(L)  12.0(L) 11.6(L)  Hematocrit 39.0 - 52.0 % 35.3(L) 33.8(L) 33.5(L)  Platelets 150 - 400 K/uL 259 210 167     CBG: Recent Labs  Lab 09/05/20 0614 09/05/20 1125 09/05/20 1642 09/05/20 2057 09/06/20 0608  GLUCAP 153* 117* 110* 148* 138*    Brief HPI:   EASTON FETTY is a 40 y.o. male with history of HTN, morbid obesity, T2DM, OSA, diagnosis of COVID-19 less than 1 week prior to admission on 07/24/2020 with dysarthria, right hemiparesis progressing to right facial droop with inability to speak.  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  CT perfusion showed large area of hypoperfusion without core infarct.  CT angioplasty of MC occlusion unsuccessful due to resistance and Dr. Leonie Man recommended DAPT x3 months followed by ASA alone.  Patient was started on dysphagia 3 diet due to mild oropharyngeal dysphagia.  Patient with resultant right dense hemiplegia with right inattention, severe right lean, aphasia with apraxia and dysgraphia affecting communication, mobility and ADLs.  CIR was recommended due to functional decline.   Hospital Course: Justin Mason was admitted to rehab 08/09/2020 for inpatient therapies to consist of PT, ST and OT at least three hours five days a week. Past admission physiatrist, therapy team and rehab RN have worked together to provide customized collaborative inpatient rehab. Blood pressures were monitored on TID basis and HCTZ was discontinued and Avapro was decreased to 150 mg to avoid hypotension.  His blood sugars have been monitored with ac/hs CBG checks and SSI was use prn for tighter BS control.  Wilder Glade was resumed and Amaryl was restarted on 02/23.  He has had hypoglycemic episodes with blood sugars down to 46 therefore Amaryl was decreased to 1 mg/day and family advised to continue monitoring blood sugars and follow-up with PCP for further adjustment.  Serial check of labs showed evidence of AKI and he was briefly treated with IV fluids with improvement.  Serial check of CBC shows H&H to be stable.  Abnormal LFTs are resolving.   BLE  Dopplers done past admission and were negative for DVT.  He was maintained on Lovenox for DVT prophylaxis during his stay.  He was found to have E. coli UTI treated with 7-day course of Macrodantin.  He reported right elbow paind with  CT of elbow showing chronic anconeus tendinosis.  Gabapentin was added to help with pain management.  Right lower extremity strength is improving however he has also had left knee pain impacting his activity.  MRI done revealing complete chronic ACL tear, tearing of both medial and lateral menisci, sever tricompartmental OA and and lipoma arborescens.  Voltaren gel has been used for local measures.  He did also have left gout flare on 03/03 and was started on colchicine with improvement in symptoms.  Interviewed ulcer is being treated with Santyl with damp to dry dressing daily.  He did   Rehab course: During patient's stay in rehab weekly team conferences were held to monitor patient's progress, set goals and discuss barriers to discharge. At admission, patient required max assist with ADL task and total assist with mobility.  He exhibited severe expressive language impairment and mild receptive deficits with mild oropharyngeal dysphagia.  He has had improvement in activity tolerance, balance, postural control as well as ability to compensate for deficits. He  has had improvement in functional use RUE  and RLE as well as improvement in awareness.  He requires mod assist for ADL tasks.  He requires mod assist  for bed mobility and is able to get from supine to sitting with min assist with use of rail.  He is able to perform sliding board transfers with mod assist however family has been educated on use of Hoyer lift for safe transfers.  He is able to propel his wheelchair for 100 feet with min assist.  He is able to respond to question and express wants and needs with 2-3 word utterances.  He requires supervision for basic cognitive tasks and min assist for expression as well as min  to mod assist for adequate phonation/expression.  Disposition: Home  Diet: Carb Modified/Heart Healthy.  Wound care:  Cleanse area on buttock with soap and water, pat dry, apply a layer of santyl to yellow slough and cover with barely damp dressing followed by dry dressing (sticking helps to debride the slough). Change daily and more often if soiled.  Special Instructions: 1. Monitor BS ac/hs and follow up with PCP for further adjustment.  2. Perform pressure relief measures every 30 minutes when up.   Discharge Instructions    Ambulatory referral to Neurology   Complete by: As directed    An appointment is requested in approximately: 2-3 weeks   Ambulatory referral to Physical Medicine Rehab   Complete by: As directed    3-4 weeks follow up     Allergies as of 09/06/2020   No Known Allergies     Medication List    STOP taking these medications   aspirin 325 MG tablet Replaced by: aspirin 81 MG EC tablet   feeding supplement Liqd Replaced by: nutrition supplement (JUVEN) Pack   meloxicam 15 MG tablet Commonly known as: MOBIC   olmesartan-hydrochlorothiazide 40-25 MG tablet Commonly known as: BENICAR HCT   pantoprazole sodium 40 mg/20 mL Pack Commonly known as: PROTONIX Replaced by: pantoprazole 40 MG tablet   potassium chloride 20 MEQ packet Commonly known as: KLOR-CON     TAKE these medications   acetaminophen 325 MG tablet Commonly known as: TYLENOL Take 1-2 tablets (325-650 mg total) by mouth every 4 (four) hours as needed for mild pain.   allopurinol 100 MG tablet Commonly known as: ZYLOPRIM Take 100 mg by mouth as needed (gout).   aspirin 81 MG EC tablet Take 1 tablet (81 mg total) by mouth daily. Swallow whole. Start taking on: September 07, 2020 Replaces: aspirin 325 MG tablet   cloNIDine 0.1 MG tablet Commonly known as: Catapres Take 1 tablet (0.1 mg total) by mouth 2 (two) times daily.   clopidogrel 75 MG tablet Commonly known as: PLAVIX Take 1  tablet (75 mg total) by mouth daily. Start taking on: September 07, 2020   colchicine 0.6 MG tablet Take 1 tablet (0.6 mg total) by mouth daily.   collagenase ointment Commonly known as: SANTYL Apply topically daily. Start taking on: September 07, 2020 Notes to patient: Apply to yellow slough on buttock and cover with damp to dry dressing. Change daily and more often if soiled.    dapagliflozin propanediol 10 MG Tabs tablet Commonly known as: FARXIGA Take 1 tablet (10 mg total) by mouth daily.   diclofenac Sodium 1 % Gel Commonly known as: VOLTAREN Apply 2 g topically 4 (four) times daily.   gabapentin 100 MG capsule Commonly known as: NEURONTIN Take 1 capsule (100 mg total) by mouth 3 (three) times daily.   glimepiride 1 MG tablet Commonly known as: AMARYL Take 1 tablet (1 mg total) by mouth daily with breakfast. Start taking on: September 07, 2020   irbesartan 150 MG tablet Commonly known as: AVAPRO Take 1 tablet (150 mg total) by mouth daily. Start taking on: September 07, 2020   metFORMIN 1000 MG tablet Commonly known as: GLUCOPHAGE Take 1 tablet (1,000 mg total) by mouth 2 (two) times daily.   metoprolol tartrate 100 MG tablet Commonly known as: LOPRESSOR Take 1 tablet (100 mg total) by mouth 2 (two) times daily.   multivitamin with minerals Tabs tablet Take 1 tablet by mouth daily.   nutrition supplement (JUVEN) Pack Take 1 packet by mouth 2 (two) times daily between meals. Replaces: feeding supplement Liqd   pantoprazole 40 MG tablet Commonly known as: PROTONIX Take 1 tablet (40 mg total) by mouth daily. Start taking on: September 07, 2020 Replaces: pantoprazole sodium 40 mg/20 mL Pack   rosuvastatin 10 MG tablet Commonly known as: CRESTOR Take 1 tablet (10 mg total) by mouth daily. Notes to patient: Start tomorrow++   sertraline 25 MG tablet Commonly known as: ZOLOFT Take 1 tablet (25 mg total) by mouth daily.   VITAMIN C PO Take 1 tablet by mouth every evening.    Vitamin D-3 25 MCG (1000 UT) Caps Take 1,000 Units by mouth every evening.   Zinc 50 MG Tabs Take 200 mg by mouth every evening.       Follow-up Information    Barnetta Chapel, NP Follow up.   Specialty: Family Medicine Contact information: [place] [address] [phone]        Jamse Arn, MD Follow up.   Specialty: Physical Medicine and Rehabilitation Why: Office will call you with follow up appiontment  Contact information: [address] [place] [address] [phone]        [facility]. Call.   Why: for stroke follow up Contact information: [address]     [place] [address] [phone]              Signed: Bary Leriche 09/06/2020, 4:52 PM

## 2020-09-04 NOTE — Progress Notes (Signed)
[name] PHYSICAL MEDICINE & REHABILITATION PROGRESS NOTE  Subjective/Complaints: Making great progress with therapy Mother and girlfriend at bedside feel ready to take him home on Friday.  Girlfriend reports diffuse aches and pains Mom asks about seizure risk.    ROS: limited due to language, +pain  Objective: Vital Signs: Blood pressure 129/67, pulse 78, temperature 98.8 F (37.1 C), resp. rate 16, height 6\' 1"  (1.854 m), weight (!) 149 kg, SpO2 100 %. No results found. Recent Labs    09/02/20 0443  WBC 7.8  HGB 12.0*  HCT 35.3*  PLT 259   Recent Labs    09/02/20 0443  NA 138  K 3.9  CL 101  CO2 27  GLUCOSE 131*  BUN 13  CREATININE 0.95  CALCIUM 9.4    Intake/Output Summary (Last 24 hours) at 09/04/2020 1135 Last data filed at 09/04/2020 0900 Gross per 24 hour  Intake 840 ml  Output --  Net 840 ml     Pressure Injury 08/09/20 Buttocks Left;Right Deep Tissue Pressure Injury - Purple or maroon localized area of discolored intact skin or blood-filled blister due to damage of underlying soft tissue from pressure and/or shear. (Active)  08/09/20 1716  Location: Buttocks  Location Orientation: Left;Right  Staging: Deep Tissue Pressure Injury - Purple or maroon localized area of discolored intact skin or blood-filled blister due to damage of underlying soft tissue from pressure and/or shear.  Wound Description (Comments):   Present on Admission: Yes    Physical Exam: BP 129/67 (BP Location: Left Arm)   Pulse 78   Temp 98.8 F (37.1 C)   Resp 16   Ht 6\' 1"  (1.854 m)   Wt (!) 149 kg   SpO2 100%   BMI 43.34 kg/m   Gen: no distress, normal appearing HEENT: oral mucosa pink and moist, NCAT Cardio: Reg rate Chest: normal effort, normal rate of breathing Abd: soft, non-distended Ext: no edema Psych: pleasant, normal affect Skin: deep tissue pressure injury Musc:  Right upper extremity edema, stable Right elbow tenderness, unchanged Left knee TTP,  stable Neuro: Alert Expressive aphasia, persistent Nonverbal, stable Motor: RUE 0/5 proximal to 0/5 distal, stable RLE: 3 -/5 proximal to distal, stable  Assessment/Plan: 1. Functional deficits which require 3+ hours per day of interdisciplinary therapy in a comprehensive inpatient rehab setting.  Physiatrist is providing close team supervision and 24 hour management of active medical problems listed below.  Physiatrist and rehab team continue to assess barriers to discharge/monitor patient progress toward functional and medical goals   Care Tool:  Bathing    Body parts bathed by patient: Right arm,Left arm,Chest,Abdomen,Right upper leg,Left upper leg,Right lower leg,Left lower leg,Face,Front perineal area   Body parts bathed by helper: Buttocks,Left arm     Bathing assist Assist Level: Minimal Assistance - Patient > 75%     Upper Body Dressing/Undressing Upper body dressing   What is the patient wearing?: Pull over shirt    Upper body assist Assist Level: Minimal Assistance - Patient > 75%    Lower Body Dressing/Undressing Lower body dressing      What is the patient wearing?: Pants,Incontinence brief     Lower body assist Assist for lower body dressing: Moderate Assistance - Patient 50 - 74%     Toileting Toileting    Toileting assist Assist for toileting: Maximal Assistance - Patient 25 - 49% Assistive Device Comment: Bedrails   Transfers Chair/bed transfer  Transfers assist  Chair/bed transfer activity did not occur: Safety/medical concerns  Chair/bed transfer assist level: Moderate Assistance - Patient 50 - 74%     Locomotion Ambulation   Ambulation assist   Ambulation activity did not occur: Safety/medical concerns          Walk 10 feet activity   Assist  Walk 10 feet activity did not occur: Safety/medical concerns        Walk 50 feet activity   Assist Walk 50 feet with 2 turns activity did not occur: Safety/medical concerns          Walk 150 feet activity   Assist Walk 150 feet activity did not occur: Safety/medical concerns         Walk 10 feet on uneven surface  activity   Assist Walk 10 feet on uneven surfaces activity did not occur: Safety/medical concerns         Wheelchair     Assist Will patient use wheelchair at discharge?: Yes Type of Wheelchair: Manual    Wheelchair assist level: Minimal Assistance - Patient > 75% Max wheelchair distance: 100    Wheelchair 50 feet with 2 turns activity    Assist        Assist Level: Minimal Assistance - Patient > 75%   Wheelchair 150 feet activity     Assist      Assist Level: Moderate Assistance - Patient 50 - 74%   Medical Problem List and Plan: 1.  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  Continue CIR  WHO/PRAFO nightly--elevate/support RUE while in bed/chair 2.  Antithrombotics: -DVT/anticoagulation:  Pharmaceutical: Continue Lovenox  Dopplers negative for DVT             -antiplatelet therapy: DAPT X 3 months followed by ASA alone (changed to 81)--Plavix added on 08/09/20 3. Pain Management: Mobic DC'd   Right elbow pain:  CT performed and really unremarkable except for chronic anconeus tendinosis  Trial of gabapentin 100mg  tid  Voltaren gel started on 2/23  Controlled on 3/2 with medications, discussed topical and oral pain medication options with family.  4. Mood: LCSW to follow for evaluation and support.              -antipsychotic agents: N/A 5. Neuropsych: This patient is not fully capable of making decisions on his own behalf.  Discussed with Neuropsych before/after eval - limited due to language, however, expected coping with loss 6. Skin/Wound Care: Routine pressure relief measures.   Continue Santyl to intergluteal ulcer 7. Fluids/Electrolytes/Nutrition: Monitor I/Os.  8. HTN:  Monitor BP tid   HCTZ daily, DC'd on 2/7  Avapro decreased to 150 on 2/8  Catapres twice daily, Lopressor bid.   Slightly elevated on 2/1, will consider resuming medications if persistent 9. T2DM with hyperglycemia: Hgb A1c-6.2.   CBG (last 3)  Recent Labs    09/03/20 1622 09/03/20 2125 09/04/20 0528  GLUCAP 121* 130* 143*      Metformin 1000 mg bid  Farxiga restarted  Amaryl restarted on 2/23, increased to 1.5 on 3/2  Hypoglycemic to 46- decrease Amaryl to 1mg  10. Covid 19--Positive  on 07/24/20-->no treatment as asymptomatic. Continue zinc and vitamin C.  11. Morbid obesity: BMI 46. Educate on appropriate diet and exercise to help promote health and mobility.  2/26- BMI down to 43- con't to work on this with diet/exercise              Encourage weight loss 12.  Hyperlipidemia: Crestor  13. H/o gout: continue allopurinol.  14. Hyponatremia: Resolved 15. AKI:   Cr.  0.95 on 2/28  Received IVF x3 nights ended on 2/11   Echo: EF 60-65%  Continue to encourage fluids 16. Elevated liver enzymes: Resolved  ALT within normal limits on 2/11 17. Mild leukocytosis: Resolved  WBCs 8.2 on 2/14  Encourage IS as possible, OOB  See #18 18.  Acute lower UTI  UA +, urine culture showing E. coli  Completed course of Macrobid  20.  Acute blood loss anemia  Hemoglobin 12.0 on 2/28 21.  Left knee pain - acute on chronic  Xray showing OA   MRI personally reviewed showing OA.  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

## 2020-09-05 LAB — GLUCOSE, CAPILLARY
Glucose-Capillary: 153 mg/dL — ABNORMAL HIGH (ref 70–99)
Glucose-Capillary: 117 mg/dL — ABNORMAL HIGH (ref 70–99)
Glucose-Capillary: 110 mg/dL — ABNORMAL HIGH (ref 70–99)
Glucose-Capillary: 148 mg/dL — ABNORMAL HIGH (ref 70–99)

## 2020-09-05 LAB — URIC ACID: Uric Acid, Serum: 5.2 mg/dL (ref 3.7–8.6)

## 2020-09-05 MED ORDER — COLCHICINE 0.6 MG PO TABS
0.6000 mg | ORAL_TABLET | Freq: Two times a day (BID) | ORAL | Status: DC
  Administered 2020-09-05 – 2020-09-06 (×2): 0.6 mg via ORAL
  Filled 2020-09-05 (×2): qty 1

## 2020-09-05 NOTE — Progress Notes (Addendum)
Left ankle with edema and hypersensitive to touch--no pain with ROM. No erythema but has history of gout and GF reports left great toe and 5th toes being red a few days ago. Will check uric acid level but will treat empirically with colchicine 0.6 mg now and 6 hours later.

## 2020-09-05 NOTE — Progress Notes (Signed)
[place] PHYSICAL MEDICINE & REHABILITATION PROGRESS NOTE  Subjective/Complaints: Left ankle edematous and hypersensitive. Has a history of gout- colchicine ordered Participating in caregiver training today.  VSS  ROS: limited due to language, +pain  Objective: Vital Signs: Blood pressure 119/73, pulse 87, temperature 97.6 F (36.4 C), temperature source Oral, resp. rate 17, height 6\' 1"  (1.854 m), weight (!) 149 kg, SpO2 99 %. No results found. No results for input(s): WBC, HGB, HCT, PLT in the last 72 hours. No results for input(s): NA, K, CL, CO2, GLUCOSE, BUN, CREATININE, CALCIUM in the last 72 hours.  Intake/Output Summary (Last 24 hours) at 09/05/2020 1633 Last data filed at 09/05/2020 1315 Gross per 24 hour  Intake 690 ml  Output -  Net 690 ml     Pressure Injury 08/09/20 Buttocks Left;Right Deep Tissue Pressure Injury - Purple or maroon localized area of discolored intact skin or blood-filled blister due to damage of underlying soft tissue from pressure and/or shear. (Active)  08/09/20 1716  Location: Buttocks  Location Orientation: Left;Right  Staging: Deep Tissue Pressure Injury - Purple or maroon localized area of discolored intact skin or blood-filled blister due to damage of underlying soft tissue from pressure and/or shear.  Wound Description (Comments):   Present on Admission: Yes    Physical Exam: BP 119/73 (BP Location: Left Arm)   Pulse 87   Temp 97.6 F (36.4 C) (Oral)   Resp 17   Ht 6\' 1"  (1.854 m)   Wt (!) 149 kg   SpO2 99%   BMI 43.34 kg/m   Gen: no distress, normal appearing HEENT: oral mucosa pink and moist, NCAT Cardio: Reg rate Chest: normal effort, normal rate of breathing Abd: soft, non-distended Ext: no edema Psych: pleasant, normal affect Skin: deep tissue pressure injury Musc:  Right upper extremity edema, stable Right elbow tenderness, unchanged Left knee TTP, stable Left ankle edematous and hypersensitive.  Neuro:  Alert Expressive aphasia, persistent Nonverbal, stable Motor: RUE 0/5 proximal to 0/5 distal, stable RLE: 3 -/5 proximal to distal, stable  Assessment/Plan: 1. Functional deficits which require 3+ hours per day of interdisciplinary therapy in a comprehensive inpatient rehab setting.  Physiatrist is providing close team supervision and 24 hour management of active medical problems listed below.  Physiatrist and rehab team continue to assess barriers to discharge/monitor patient progress toward functional and medical goals   Care Tool:  Bathing    Body parts bathed by patient: Right arm,Left arm,Chest,Abdomen,Right upper leg,Left upper leg,Right lower leg,Left lower leg,Face,Front perineal area   Body parts bathed by helper: Buttocks,Left arm     Bathing assist Assist Level: Minimal Assistance - Patient > 75%     Upper Body Dressing/Undressing Upper body dressing   What is the patient wearing?: Pull over shirt    Upper body assist Assist Level: Minimal Assistance - Patient > 75%    Lower Body Dressing/Undressing Lower body dressing      What is the patient wearing?: Pants,Incontinence brief     Lower body assist Assist for lower body dressing: Moderate Assistance - Patient 50 - 74%     Toileting Toileting    Toileting assist Assist for toileting: Maximal Assistance - Patient 25 - 49% Assistive Device Comment: Bedrails   Transfers Chair/bed transfer  Transfers assist  Chair/bed transfer activity did not occur: Safety/medical concerns  Chair/bed transfer assist level: Dependent - mechanical lift     Locomotion Ambulation   Ambulation assist   Ambulation activity did not occur: Safety/medical concerns  Walk 10 feet activity   Assist  Walk 10 feet activity did not occur: Safety/medical concerns        Walk 50 feet activity   Assist Walk 50 feet with 2 turns activity did not occur: Safety/medical concerns         Walk 150 feet  activity   Assist Walk 150 feet activity did not occur: Safety/medical concerns         Walk 10 feet on uneven surface  activity   Assist Walk 10 feet on uneven surfaces activity did not occur: Safety/medical concerns         Wheelchair     Assist Will patient use wheelchair at discharge?: Yes Type of Wheelchair: Manual    Wheelchair assist level: Minimal Assistance - Patient > 75% Max wheelchair distance: 100    Wheelchair 50 feet with 2 turns activity    Assist        Assist Level: Minimal Assistance - Patient > 75%   Wheelchair 150 feet activity     Assist      Assist Level: Moderate Assistance - Patient 50 - 74%   Medical Problem List and Plan: 1.  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  Continue CIR  WHO/PRAFO nightly--elevate/support RUE while in bed/chair 2.  Antithrombotics: -DVT/anticoagulation:  Pharmaceutical: Continue Lovenox  Dopplers negative for DVT             -antiplatelet therapy: DAPT X 3 months followed by ASA alone (changed to 81)--Plavix added on 08/09/20 3. Pain Management: Mobic DC'd   Right elbow pain:  CT performed and really unremarkable except for chronic anconeus tendinosis  Trial of gabapentin 100mg  tid  Voltaren gel started on 2/23  Controlled on 3/2 with medications, discussed topical and oral pain medication options with family.  4. Mood: LCSW to follow for evaluation and support.              -antipsychotic agents: N/A 5. Neuropsych: This patient is not fully capable of making decisions on his own behalf.  Discussed with Neuropsych before/after eval - limited due to language, however, expected coping with loss 6. Skin/Wound Care: Routine pressure relief measures.   Continue Santyl to intergluteal ulcer 7. Fluids/Electrolytes/Nutrition: Monitor I/Os.  8. HTN: Monitor BP tid   HCTZ daily,  DC'd on 2/7  Avapro decreased to 150 on 2/8  Catapres twice daily, Lopressor bid.   Slightly elevated on 2/1, will consider resuming medications if persistent 9. T2DM with hyperglycemia: Hgb A1c-6.2.   CBG (last 3)  Recent Labs    09/04/20 2138 09/05/20 0614 09/05/20 1125  GLUCAP 137* 153* 117*      Metformin 1000 mg bid  Farxiga restarted  Amaryl restarted on 2/23, increased to 1.5 on 3/2  Hypoglycemic to 46- decrease Amaryl to 1mg   CBGs 117-153:  Continue to monitor.  10. Covid 19--Positive  on 07/24/20-->no treatment as asymptomatic. Continue zinc and vitamin C.  11. Morbid obesity: BMI 46. Educate on appropriate diet and exercise to help promote health and mobility.  2/26- BMI down to 43- con't to work on this with diet/exercise              Encourage weight loss 12.  Hyperlipidemia: Crestor 13. H/o gout: continue allopurinol.  14. Hyponatremia: Resolved 15. AKI:   Cr.  0.95 on 2/28  Received IVF x3 nights ended on 2/11   Echo: EF 60-65%  Continue to encourage fluids 16. Elevated liver enzymes: Resolved  ALT within normal limits on 2/11 17. Mild leukocytosis: Resolved  WBCs 8.2 on 2/14  Encourage IS as possible, OOB  See #18 18.  Acute lower UTI  UA +, urine culture showing E. coli  Completed course of Macrobid  20.  Acute blood loss anemia  Hemoglobin 12.0 on 2/28 21.  Left knee pain - acute on chronic  Xray showing OA   MRI personally reviewed showing OA.  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 Left ankle gout: colchicine started. Uric acid level 5.2 LOS: 27 days A FACE TO FACE EVALUATION WAS PERFORMED  Horton Chin 09/05/2020, 4:33 PM

## 2020-09-05 NOTE — Plan of Care (Signed)
  Problem: RH Balance Goal: LTG Patient will maintain dynamic sitting balance (PT) Description: LTG:  Patient will maintain dynamic sitting balance with assistance during mobility activities (PT) Outcome: Completed/Met Goal: LTG Patient will maintain dynamic standing balance (PT) Description: LTG:  Patient will maintain dynamic standing balance with assistance during mobility activities (PT) Outcome: Completed/Met   Problem: Sit to Stand Goal: LTG:  Patient will perform sit to stand with assistance level (PT) Description: LTG:  Patient will perform sit to stand with assistance level (PT) Outcome: Completed/Met   Problem: RH Bed Mobility Goal: LTG Patient will perform bed mobility with assist (PT) Description: LTG: Patient will perform bed mobility with assistance, with/without cues (PT). Outcome: Not Met (add Reason) Note: Not consistently min A for all bed mobility; usually mod A for L rolling in bed   Problem: RH Bed to Chair Transfers Goal: LTG Patient will perform bed/chair transfers w/assist (PT) Description: LTG: Patient will perform bed to chair transfers with assistance (PT). Outcome: Completed/Met   Problem: RH Car Transfers Goal: LTG Patient will perform car transfers with assist (PT) Description: LTG: Patient will perform car transfers with assistance (PT). Outcome: Not Met (add Reason) Note: Did not attempt due to safety and pt will need medical transport.    Problem: RH Wheelchair Mobility Goal: LTG Patient will propel w/c in controlled environment (PT) Description: LTG: Patient will propel wheelchair in controlled environment, # of feet with assist (PT) Outcome: Completed/Met  Magda Kiel, PT

## 2020-09-05 NOTE — Progress Notes (Signed)
Speech Language Pathology Discharge Summary  Patient Details  Name: Justin Mason MRN: [id number] Date of Birth: 11-17-1980  Today's Date: 09/05/2020 SLP Individual Time: 1100-1200 SLP Individual Time Calculation (min): 60 min   Skilled Therapeutic Interventions:  Patient seen with mother and girlfriend present for completing of family education. SLP reviewed exercises and strategies to use with patient, provided low tech communication boards which [name] (girlfriend) will look over and add to. SLP demonstrated exercises of phonation, naming exercises,etc. SLP recommended structuring patient's day and having set times to work on speech-language but also times when no demands put on patient to have to verbalize. SLP also advised Mom and girlfriend to inform others of patient's level of function and that his comprehension is very good, so as to lesson frequency of people treating patient as a child. All questions answered and education has been completed.     Patient has met 5 of 6 long term goals.  Patient to discharge at overall Supervision;Min level.  Reasons goals not met: Patient has demonstrated consistent progress but requires min-modA cues for verbally communicating at word level   Clinical Impression/Discharge Summary: Patient has made consistent progress towards LTG's for speech-language and cognitive and has not yet reached a plateau. He met 5/6 LTG's and is progressing with the one goal he didnt meet (minA verbal communication). Patient is able to achieve voicing at word level consistently to respond to questions, express needs/wants and has been demonstrating 2-3 word utterances as well during recent past sessions. SLP did not initially think patient would be able to achieve a verbal production/voicing goal and so AAC communication device was considered during treatment sessions. At time of discharge, patient is able to and prefers to express wants/needs verbally but will use a low tech  communication board (word level choices) to supplement/augment his communication. Patient's receptive language at basic level is largely [facility]. He is able to demonstrate word and sentence level reading comprehension but struggles with multiple sentences/paragraph. He still benefits from cues to consistently phonate when verbalizing, however this has improved significantly as well. Patient exhibits good awareness overall to perseverations on words or phonemes, but requires phonemic or semantic cues to redirect. Patient is discharging at supervision level for basic level cognitive skills, minA for immediate needs/wants expression and min-modA for consistent and adequate phonation/voicing during verbal expression. He is expected to continue to improve with his speech and language skills with continued SLP intervention after discharge from CIR.  Care Partner:  Caregiver Able to Provide Assistance: Yes  Type of Caregiver Assistance: Physical;Cognitive  Recommendation:  Home Health SLP;Outpatient SLP;24 hour supervision/assistance  Rationale for SLP Follow Up: Maximize functional communication;Maximize cognitive function and independence   Equipment: N/A for speech   Reasons for discharge: Discharged from hospital   Patient/Family Agrees with Progress Made and Goals Achieved: Yes   Sonia Baller, MA, CCC-SLP Speech Therapy

## 2020-09-05 NOTE — Progress Notes (Signed)
Physical Therapy Discharge Summary  Patient Details  Name: Justin Mason MRN: [id number] Date of Birth: 1981/06/10  Today's Date: 09/05/2020 PT Individual Time: 1300-1405 PT Individual Time Calculation (min): 65 min   Skilled Therapeutic Interventions/Progress Updates:  Patient in supine with mother and girlfriend, Justin Mason, in room working to change brief and Estate agent.  Justin Mason educating the mother how to make sure brief is on correctly and positioned for bed fit.  Patient performing rolling with mod A to L to place brief.  Able to bridge with A for R LE positioning for pulling up pants.  Patient supine to sit with min A using rail.  Patient performed slide board transfer from air bed with foam edges with 2 person assist for safety due to positioning board over foam edge of bed.  Patient in w/c propelled x 100' with min A for direction.  Patient lifted to mat for family education with mother setting up lift after assisting Justin Mason to place pad under patient with cues for using right loops.  She reports lift pad at home has only one set of loops.  Patient performed sit to stand x 2 from elevated mat with Harmon Pier walker with 2 person A (PT and Justin Mason), able to stand up to 30 seconds with mod A for balance and bilateral UE support.  Performed slide board transfer to w/c with mod A of 1.  Discussed follow up HHPT to work on anterior weight shifts for more LE use for transfers for ease and safety prior to attempting car transfers.  Discussed with family medical transport and SW informed to give information/resources.  Patient's family pushed to room and took hoyer as well.  Patient has met 5 of 7 long term goals due to improved balance, increased strength and ability to compensate for deficits.  Patient to discharge at a wheelchair level [place].   Patient's care partner is independent to provide the necessary physical assistance at discharge.  Reasons goals not met: Patient not consistently performing all bed  mobility with min A and though can transfer with  Mod A using slide board will have hoyer lift at home for safest transfer for family.  Also not able to safely transfer into car with family at this time so information on medical transportation given.   Recommendation:  Patient will benefit from ongoing skilled PT services in home health setting to continue to advance safe functional mobility, address ongoing impairments in strength, balance, activity tolerance, R UE/LE use, and minimize fall risk.  Equipment: wheelchair, cushion, hospital bed, hoyer lift, slide board, lap tray   Reasons for discharge: discharge from hospital  Patient/family agrees with progress made and goals achieved: Yes  PT Discharge Precautions/Restrictions Precautions Precautions: Fall Precaution Comments: right side weakness, left knee pain and instability Vital Signs Therapy Vitals Temp: 97.6 F (36.4 C) Temp Source: Oral Pulse Rate: 87 Resp: 17 BP: 119/73 Patient Position (if appropriate): Sitting Oxygen Therapy SpO2: 99 % O2 Device: Room Air Pain Pain Assessment Pain Scale: 0-10 Pain Score: 5  Pain Type: Chronic pain Pain Location: Foot Pain Orientation: Right Pain Descriptors / Indicators: Sore;Tender Pain Onset: On-going Pain Intervention(s): Repositioned Vision/Perception  Vision - Assessment Eye Alignment: Within Functional Limits Perception Perception: Within Functional Limits Praxis Praxis: Impaired Praxis Impairment Details: Motor planning  Cognition Overall Cognitive Status: Impaired/Different from baseline Arousal/Alertness: Awake/alert Orientation Level: Oriented X4 Attention: Selective Selective Attention: Impaired Selective Attention Impairment: Verbal complex;Verbal basic Awareness: Impaired Awareness Impairment: Emergent impairment Problem Solving: Impaired Problem  Solving Impairment: Verbal basic Safety/Judgment: Impaired Sensation Sensation Light Touch: Appears  Intact Coordination Gross Motor Movements are Fluid and Coordinated: No Fine Motor Movements are Fluid and Coordinated: No Finger Nose Finger Test: left WNL, right unable Heel Shin Test: unable to perform on the R Motor  Motor Motor: Hemiplegia;Abnormal tone Motor - Discharge Observations: R UE/LE hemiparesis with improving motor return  Mobility Bed Mobility Rolling Right: Contact Guard/Touching assist Rolling Left: Moderate Assistance - Patient 50-74% Supine to Sit: Minimal Assistance - Patient > 75% Sit to Supine: Minimal Assistance - Patient > 75% Transfers Transfers: Lateral/Scoot Human resources officer via Geophysicist/field seismologist;Sit to Stand Sit to Stand: 2 Helpers Lateral/Scoot Transfers: Moderate Assistance - Patient 50-74% Transfer via Lift Equipment:  Product manager) Locomotion  Gait Ambulation: No Gait Gait: No Architect: Yes Wheelchair Assistance: Minimal assistance - Patient >75% Wheelchair Propulsion: Left upper extremity;Left lower extremity Wheelchair Parts Management: Needs assistance Distance: 100'  Trunk/Postural Assessment  Cervical Assessment Cervical Assessment: Within Functional Limits Thoracic Assessment Thoracic Assessment: Exceptions to [facility] (rounded shoulders) Lumbar Assessment Lumbar Assessment: Exceptions to [facility] (posterior pelvic tilt) Postural Control Postural Control: Deficits on evaluation Righting Reactions: increased time to correct balance loss to R  Balance Balance Balance Assessed: Yes Static Sitting Balance Static Sitting - Level of Assistance: 6: Modified independent (Device/Increase time) Dynamic Sitting Balance Dynamic Sitting - Balance Support: Left upper extremity supported Dynamic Sitting - Level of Assistance: 5: Stand by assistance Static Standing Balance Static Standing - Level of Assistance: 3: Mod assist Dynamic Standing Balance Dynamic Standing - Level of Assistance: 1: +2 Total assist Extremity  Assessment      RLE Assessment RLE Assessment: Exceptions to [facility] Passive Range of Motion (PROM) Comments: [facility] General Strength Comments: hip flexion 2+/5, knee extension 2/5, ankle DF 3-/5 LLE Assessment LLE Assessment: Exceptions to [facility] Active Range of Motion (AROM) Comments: generally WFL General Strength Comments: hip flexion 5/5, knee extension 4/5, ankle DF 4-/5 (pain on L lateral foot area)    Justin Mason  Magda Kiel, PT 09/05/2020, 5:23 PM

## 2020-09-05 NOTE — Progress Notes (Signed)
Inpatient Rehabilitation Care Coordinator Discharge Note  The overall goal for the admission was met for:   Discharge location: -HOME WITH MOM AND GIRLFRIEND TO PROVIDE 24/7 CARE-PT'S TWO SIBLINGS TO ASSIST ALSO  Length of Stay: Yes-28 DAYS  Discharge activity level: Yes-MIN-MOD WHEELCHAIR LEVEL  Home/community participation: Yes  Services provided included: MD, RD, PT, OT, SLP, RN, CM, TR, Pharmacy, Neuropsych and SW  Financial Services: Private Insurance: Haematologist offered to/list presented to:YES  Follow-up services arranged: Home Health: [facility]-PT,RN,SP,AIDE, DME: [facility]-WHEELCHAIR, 30 TRASNFER BOARD, HOYER LIFT, HOSPITAL BED, WIDE DROP-[facility] and Patient/Family has no preference for HH/DME agencies  Comments (or additional information):MOM AND GIRLFRIEND WERE HERE FOR THE WHOLE WEEK TO DO FAMILY EDUCATION, [place]. PT WILL BE A HIGH RISK TO FALL AND RE-ADMIT TO [place] DUE TO AMOUNT OF CARE PT REQUIRES  Patient/Family verbalized understanding of follow-up arrangements: Yes  Individual responsible for coordination of the follow-up plan: LINDA-MOM [phone]  Confirmed correct DME delivered: Elease Hashimoto 09/05/2020    Chasey Dull, [place] Rhyme

## 2020-09-05 NOTE — Progress Notes (Signed)
Occupational Therapy Session Note  Patient Details  Name: Justin Mason MRN: [id number] Date of Birth: March 19, 1981  Today's Date: 09/05/2020 OT Individual Time: 1000-1100 OT Individual Time Calculation (min): 60 min    Short Term Goals: Week 1:  OT Short Term Goal 1 (Week 1): Pt will complete sit<>stand at stedy with min assist in preperation for self care. OT Short Term Goal 1 - Progress (Week 1): Met OT Short Term Goal 2 (Week 1): Pt will complete UB dressing with mod assist using hemitechniques. OT Short Term Goal 2 - Progress (Week 1): Met OT Short Term Goal 3 (Week 1): Pt will complete toilet transfer with mod assist +2 using LRAD. OT Short Term Goal 3 - Progress (Week 1): Progressing toward goal OT Short Term Goal 4 (Week 1): Pt will bathe UB/LB with mod assist using long handled sponge. OT Short Term Goal 4 - Progress (Week 1): Met  Skilled Therapeutic Interventions/Progress Updates:    1:1. Pt received in bed agreeable to OT with mother and girlfriend present. Pt girlfriend very active in BADL attempting to direct mother to help, however mother not listening and dressing pt. Pt demo ability to cross BLE into figure 4 to thread BLE for LB dressing and OT demo figure 8 wrapping RLE for edema management per PA. Girlfriend took video. Girlfriend completes on other side with min cuing. Mother states, "im watching." Pt bridges hips for clothing management. pt dons shirt with MOD A to lift LUE for threading and pull shirt down back. Continue to reinforce hemi dressing techniques. Mother and girlfriend with questions about sling delivered as CSW had ordered toilet/U sling and at looking at picture that was not delivered. Messaged CSW and provided education about various slings available/support that is provided. Exited session with pt seated in bed with RT in room and therapy dog present, exit alarm on and call light in reach   Therapy Documentation Precautions:  Precautions Precautions:  Fall Precaution Comments: right side weakness, left knee pain and instability Restrictions Weight Bearing Restrictions: No General:   Vital Signs: Therapy Vitals Temp: 97.9 F (36.6 C) Pulse Rate: 65 Resp: 14 BP: 119/79 Patient Position (if appropriate): Lying Oxygen Therapy SpO2: 98 % O2 Device: Room Air Pain:   ADL: ADL Eating: Set up Where Assessed-Eating: Wheelchair Grooming: Setup Where Assessed-Grooming: Wheelchair,Sitting at sink Upper Body Bathing: Minimal assistance Where Assessed-Upper Body Bathing: Sitting at sink,Wheelchair Lower Body Bathing: Minimal assistance Where Assessed-Lower Body Bathing: Bed level Upper Body Dressing: Minimal assistance Where Assessed-Upper Body Dressing: Wheelchair Lower Body Dressing: Minimal assistance Where Assessed-Lower Body Dressing: Bed level Toileting: Maximal assistance Where Assessed-Toileting: Bed level Toilet Transfer: Other (comment) (SB transfers +2, patient incontinent and with difficulty communicating need in time) Armed forces technical officer Method: Theatre manager: Extra wide drop arm bedside commode ADL Comments: unable to perform shower transfers safely at this time due to pain in left knee and body habitus Vision   Perception    Praxis   Exercises:   Other Treatments:     Therapy/Group: Individual Therapy  Tonny Branch 09/05/2020, 6:54 AM

## 2020-09-05 NOTE — Progress Notes (Addendum)
Patient ID: Justin Mason, male   DOB: April 15, 1981, 40 y.o.   MRN: [id number] Met with pt, girlfriend and mom to discuss plan for tomorrow. Pt will need to go non-emergency ambulance transport to get home. Mom received the equipment last night at home. Finishing up family education today. All feel comfortable with his care at discharge. [facility] will be providing home health services aware PT,RN,SP. OT not available at this time. Pam-PA aware would medications sent to [place]. Set up ambulance transport for 10:30 via [facility]. Have given mom and girlfriend list of wheelchair accessible transport companies for MD appointments, etc

## 2020-09-06 ENCOUNTER — Other Ambulatory Visit: Payer: Self-pay | Admitting: Physical Medicine and Rehabilitation

## 2020-09-06 DIAGNOSIS — E1165 Type 2 diabetes mellitus with hyperglycemia: Secondary | ICD-10-CM

## 2020-09-06 DIAGNOSIS — M109 Gout, unspecified: Secondary | ICD-10-CM

## 2020-09-06 DIAGNOSIS — E119 Type 2 diabetes mellitus without complications: Secondary | ICD-10-CM

## 2020-09-06 LAB — GLUCOSE, CAPILLARY: Glucose-Capillary: 138 mg/dL — ABNORMAL HIGH (ref 70–99)

## 2020-09-06 MED ORDER — CLOPIDOGREL BISULFATE 75 MG PO TABS: 75.0000 mg | ORAL_TABLET | Freq: Every day | ORAL | 0 refills | Status: DC

## 2020-09-06 MED ORDER — CLONIDINE HCL 0.1 MG PO TABS: 0.1000 mg | ORAL_TABLET | Freq: Two times a day (BID) | ORAL | 0 refills | Status: DC

## 2020-09-06 MED ORDER — METOPROLOL TARTRATE 100 MG PO TABS: 100.0000 mg | ORAL_TABLET | Freq: Two times a day (BID) | ORAL | 0 refills | Status: DC

## 2020-09-06 MED ORDER — GABAPENTIN 100 MG PO CAPS: 100.0000 mg | ORAL_CAPSULE | Freq: Three times a day (TID) | ORAL | 0 refills | Status: DC

## 2020-09-06 MED ORDER — COLLAGENASE 250 UNIT/GM EX OINT: TOPICAL_OINTMENT | Freq: Every day | CUTANEOUS | 0 refills | Status: DC

## 2020-09-06 MED ORDER — ROSUVASTATIN CALCIUM 10 MG PO TABS: 10.0000 mg | ORAL_TABLET | Freq: Every day | ORAL | 0 refills | Status: DC

## 2020-09-06 MED ORDER — PANTOPRAZOLE SODIUM 40 MG PO TBEC: 40.0000 mg | DELAYED_RELEASE_TABLET | Freq: Every day | ORAL | 0 refills | Status: DC

## 2020-09-06 MED ORDER — COLCHICINE 0.6 MG PO TABS: 0.6000 mg | ORAL_TABLET | Freq: Every day | ORAL | 0 refills | Status: AC

## 2020-09-06 MED ORDER — DICLOFENAC SODIUM 1 % EX GEL: 2.0000 g | Freq: Four times a day (QID) | CUTANEOUS | 0 refills | Status: AC

## 2020-09-06 MED ORDER — SERTRALINE HCL 25 MG PO TABS: 25.0000 mg | ORAL_TABLET | Freq: Every day | ORAL | 0 refills | Status: DC

## 2020-09-06 MED ORDER — ASPIRIN 81 MG PO TBEC: 81.0000 mg | DELAYED_RELEASE_TABLET | Freq: Every day | ORAL | 11 refills | Status: DC

## 2020-09-06 MED ORDER — GLIMEPIRIDE 1 MG PO TABS: 1.0000 mg | ORAL_TABLET | Freq: Every day | ORAL | 0 refills | Status: DC

## 2020-09-06 MED ORDER — METFORMIN HCL 1000 MG PO TABS: 1000.0000 mg | ORAL_TABLET | Freq: Two times a day (BID) | ORAL | 0 refills | Status: DC

## 2020-09-06 MED ORDER — JUVEN PO PACK: 1.0000 | PACK | Freq: Two times a day (BID) | ORAL | 0 refills | Status: DC

## 2020-09-06 MED ORDER — IRBESARTAN 150 MG PO TABS: 150.0000 mg | ORAL_TABLET | Freq: Every day | ORAL | 0 refills | Status: DC

## 2020-09-06 MED ORDER — DAPAGLIFLOZIN PROPANEDIOL 10 MG PO TABS: 10.0000 mg | ORAL_TABLET | Freq: Every day | ORAL | 0 refills | Status: DC

## 2020-09-06 MED FILL — DICLOFENAC SODIUM 1 % GEL: 1 | 10 days supply | Qty: 300 | Fill #0

## 2020-09-06 MED FILL — ASPIRIN LOW DOSE 81 MG TBEC: 81 | 30 days supply | Qty: 30 | Fill #0

## 2020-09-06 MED FILL — CLOPIDOGREL 75 MG TABLET: 75 | 30 days supply | Qty: 30 | Fill #0

## 2020-09-06 MED FILL — FARXIGA 10 MG TABLET: 10 | 30 days supply | Qty: 30 | Fill #0

## 2020-09-06 MED FILL — METOPROLOL TARTRATE 100 MG: 100 | 30 days supply | Qty: 60 | Fill #0

## 2020-09-06 MED FILL — GABAPENTIN 100 MG CAPSULE: 100 | 30 days supply | Qty: 90 | Fill #0

## 2020-09-06 MED FILL — METFORMIN HCL 1000 MG TABS: 1000 | 30 days supply | Qty: 60 | Fill #0

## 2020-09-06 MED FILL — IRBESARTAN 150 MG TABLET: 150 | 30 days supply | Qty: 30 | Fill #0

## 2020-09-06 MED FILL — GLIMEPIRIDE 2 MG TABLET: 2 | 30 days supply | Qty: 15 | Fill #0

## 2020-09-06 MED FILL — cloNIDine HCL 0.1 MG TABS: 0.1 | 30 days supply | Qty: 60 | Fill #0

## 2020-09-06 MED FILL — COLCHICINE 0.6 MG TABS: 0.6 | 7 days supply | Qty: 7 | Fill #0

## 2020-09-06 MED FILL — SERTRALINE HCL 25 MG TABS: 25 | 30 days supply | Qty: 30 | Fill #0

## 2020-09-06 MED FILL — PANTOPRAZOLE SOD DR 40 MG T: 40 | 30 days supply | Qty: 30 | Fill #0

## 2020-09-06 MED FILL — SANTYL OINTMENT: 250 | 7 days supply | Qty: 30 | Fill #0

## 2020-09-06 MED FILL — ROSUVASTATIN CALCIUM 10 MG: 10 | 30 days supply | Qty: 30 | Fill #0

## 2020-09-06 NOTE — Progress Notes (Signed)
Patient left via ambulance with two attendance and girlfriend.

## 2020-09-06 NOTE — Discharge Instructions (Signed)
Inpatient Rehab Discharge Instructions  Justin Mason Discharge date and time:  09/06/20  Activities/Precautions/ Functional Status: Activity: no lifting, driving, or strenuous exercise till cleared by MD Diet: cardiac diet and diabetic diet Wound Care: Cleanse area on buttock with soap and water, pat dry, apply a layer of santyl to yellow slough and cover with barely damp dressing followed by dry dressing (sticking helps to debride the slough). Change daily and more often if soiled.   Functional status:  ___ No restrictions     ___ Walk up steps independently ___ 24/7 supervision/assistance   ___ Walk up steps with assistance ___ Intermittent supervision/assistance  ___ Bathe/dress independently ___ Walk with walker     ___ Bathe/dress with assistance ___ Walk Independently    ___ Shower independently ___ Walk with assistance    ___ Shower with assistance ___ No alcohol     ___ Return to work/school ________   Special Instructions:     COMMUNITY REFERRALS UPON DISCHARGE:    Home Health:   PT, SP, RN, AIDE                Agency:[facility]   [phone]    Medical Equipment/Items Ordered:HOSPITAL BED, HOYER LIFT, WHEELCHAIR, WIDE DROP-ARM BEDSIDE COMMODE, 30 TRANSFER BOARD AND HALF LAP TRAY                                                 Agency/Supplier:[facility]  [phone]   STROKE/TIA DISCHARGE INSTRUCTIONS SMOKING Cigarette smoking nearly doubles your risk of having a stroke & is the single most alterable risk factor  If you smoke or have smoked in the last 12 months, you are advised to quit smoking for your health.  Most of the excess cardiovascular risk related to smoking disappears within a year of stopping.  Ask you doctor about anti-smoking medications  [place] Quit Line: 1-800-QUIT NOW  Free Smoking Cessation Classes [phone]  CHOLESTEROL Know your levels; limit fat & cholesterol in your diet  Lipid Panel     Component Value Date/Time    CHOL 93 07/25/2020 0515   TRIG 190 (H) 07/25/2020 0515   HDL 19 (L) 07/25/2020 0515   CHOLHDL 4.9 07/25/2020 0515   VLDL 38 07/25/2020 0515   LDLCALC 36 07/25/2020 0515      Many patients benefit from treatment even if their cholesterol is at goal.  Goal: Total Cholesterol (CHOL) less than 160  Goal:  Triglycerides (TRIG) less than 150  Goal:  HDL greater than 40  Goal:  LDL (LDLCALC) less than 100   BLOOD PRESSURE American Stroke Association blood pressure target is less that 120/80 mm/Hg  Your discharge blood pressure is:  BP: 115/68  Monitor your blood pressure  Limit your salt and alcohol intake  Many individuals will require more than one medication for high blood pressure  DIABETES (A1c is a blood sugar average for last 3 months) Goal HGBA1c is under 7% (HBGA1c is blood sugar average for last 3 months)  Diabetes:     Lab Results  Component Value Date   HGBA1C 6.2 (H) 07/25/2020     Your HGBA1c can be lowered with medications, healthy diet, and exercise.  Check your blood sugar as directed by your physician  Call your physician if you experience unexplained or low blood sugars.  PHYSICAL ACTIVITY/REHABILITATION Goal is 30  minutes at least 4 days per week  Activity: No driving, Therapies: see above Return to work: N/A  Activity decreases your risk of heart attack and stroke and makes your heart stronger.  It helps control your weight and blood pressure; helps you relax and can improve your mood.  Participate in a regular exercise program.  Talk with your doctor about the best form of exercise for you (dancing, walking, swimming, cycling).  DIET/WEIGHT Goal is to maintain a healthy weight  Your discharge diet is:  Diet Order            Diet Carb Modified Fluid consistency: Thin; Room service appropriate? No  Diet effective now               liquids Your height is:  Height: 6\' 1"  (185.4 cm) Your current weight is: Weight: (!) 159.1 kg Your Body Mass  Index (BMI) is:  BMI (Calculated): 46.29  Following the type of diet specifically designed for you will help prevent another stroke.  Your goal weight is 189 lbs  Your goal Body Mass Index (BMI) is 19-24.  Healthy food habits can help reduce 3 risk factors for stroke:  High cholesterol, hypertension, and excess weight.  RESOURCES Stroke/Support Group:  Call [phone]   STROKE EDUCATION PROVIDED/REVIEWED AND GIVEN TO PATIENT Stroke warning signs and symptoms How to activate emergency medical system (call 911). Medications prescribed at discharge. Need for follow-up after discharge. Personal risk factors for stroke. Pneumonia vaccine given:  Flu vaccine given:  My questions have been answered, the writing is legible, and I understand these instructions.  I will adhere to these goals & educational materials that have been provided to me after my discharge from the hospital.     My questions have been answered and I understand these instructions. I will adhere to these goals and the provided educational materials after my discharge from the hospital.  Patient/Caregiver Signature _______________________________ Date __________  Clinician Signature _______________________________________ Date __________  Please bring this form and your medication list with you to all your follow-up doctor's appointments.

## 2020-09-06 NOTE — Progress Notes (Signed)
[place] PHYSICAL MEDICINE & REHABILITATION PROGRESS NOTE  Subjective/Complaints: Patient's chart reviewed- No issues reported overnight Vitals signs stable Gout flare yesterday treated with colchicine.   ROS: limited due to language, +pain  Objective: Vital Signs: Blood pressure 131/72, pulse 74, temperature 98 F (36.7 C), resp. rate 20, height 6\' 1"  (1.854 m), weight (!) 149 kg, SpO2 98 %. No results found. No results for input(s): WBC, HGB, HCT, PLT in the last 72 hours. No results for input(s): NA, K, CL, CO2, GLUCOSE, BUN, CREATININE, CALCIUM in the last 72 hours.  Intake/Output Summary (Last 24 hours) at 09/06/2020 0847 Last data filed at 09/06/2020 0720 Gross per 24 hour  Intake 960 ml  Output --  Net 960 ml     Pressure Injury 08/09/20 Buttocks Left;Right Deep Tissue Pressure Injury - Purple or maroon localized area of discolored intact skin or blood-filled blister due to damage of underlying soft tissue from pressure and/or shear. (Active)  08/09/20 1716  Location: Buttocks  Location Orientation: Left;Right  Staging: Deep Tissue Pressure Injury - Purple or maroon localized area of discolored intact skin or blood-filled blister due to damage of underlying soft tissue from pressure and/or shear.  Wound Description (Comments):   Present on Admission: Yes    Physical Exam: BP 131/72   Pulse 74   Temp 98 F (36.7 C)   Resp 20   Ht 6\' 1"  (1.854 m)   Wt (!) 149 kg   SpO2 98%   BMI 43.34 kg/m   Gen: no distress, normal appearing HEENT: oral mucosa pink and moist, NCAT Cardio: Reg rate Chest: normal effort, normal rate of breathing Abd: soft, non-distended Ext: no edema Psych: pleasant, normal affect Skin: deep tissue pressure injury Musc:  Right upper extremity edema, stable Right elbow tenderness, unchanged Left knee TTP, stable Left ankle edematous and hypersensitive.  Neuro: Alert Expressive aphasia, persistent Nonverbal, stable Motor: RUE 0/5 proximal  to 0/5 distal, stable RLE: 3 -/5 proximal to distal, stable  Assessment/Plan: 1. Functional deficits which require 3+ hours per day of interdisciplinary therapy in a comprehensive inpatient rehab setting.  Physiatrist is providing close team supervision and 24 hour management of active medical problems listed below.  Physiatrist and rehab team continue to assess barriers to discharge/monitor patient progress toward functional and medical goals   Care Tool:  Bathing    Body parts bathed by patient: Right arm,Left arm,Chest,Abdomen,Right upper leg,Left upper leg,Right lower leg,Left lower leg,Face,Front perineal area   Body parts bathed by helper: Buttocks,Left arm     Bathing assist Assist Level: Minimal Assistance - Patient > 75%     Upper Body Dressing/Undressing Upper body dressing   What is the patient wearing?: Pull over shirt    Upper body assist Assist Level: Minimal Assistance - Patient > 75%    Lower Body Dressing/Undressing Lower body dressing      What is the patient wearing?: Pants,Incontinence brief     Lower body assist Assist for lower body dressing: Moderate Assistance - Patient 50 - 74%     Toileting Toileting    Toileting assist Assist for toileting: Maximal Assistance - Patient 25 - 49% Assistive Device Comment: Bedrails   Transfers Chair/bed transfer  Transfers assist  Chair/bed transfer activity did not occur: Safety/medical concerns  Chair/bed transfer assist level: Moderate Assistance - Patient 50 - 74%     Locomotion Ambulation   Ambulation assist   Ambulation activity did not occur: Safety/medical concerns  Walk 10 feet activity   Assist  Walk 10 feet activity did not occur: Safety/medical concerns        Walk 50 feet activity   Assist Walk 50 feet with 2 turns activity did not occur: Safety/medical concerns         Walk 150 feet activity   Assist Walk 150 feet activity did not occur: Safety/medical  concerns         Walk 10 feet on uneven surface  activity   Assist Walk 10 feet on uneven surfaces activity did not occur: Safety/medical concerns         Wheelchair     Assist Will patient use wheelchair at discharge?: Yes Type of Wheelchair: Manual    Wheelchair assist level: Minimal Assistance - Patient > 75% Max wheelchair distance: 100    Wheelchair 50 feet with 2 turns activity    Assist        Assist Level: Minimal Assistance - Patient > 75%   Wheelchair 150 feet activity     Assist      Assist Level: Dependent - Patient 0%   Medical Problem List and Plan: 1.  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  DC home today  WHO/PRAFO nightly--elevate/support RUE while in bed/chair 2.  Antithrombotics: -DVT/anticoagulation:  Pharmaceutical: Continue Lovenox  Dopplers negative for DVT             -antiplatelet therapy: DAPT X 3 months followed by ASA alone (changed to 81)--Plavix added on 08/09/20 3. Pain Management: Mobic DC'd   Right elbow pain:  CT performed and really unremarkable except for chronic anconeus tendinosis  Trial of gabapentin 100mg  tid  Voltaren gel started on 2/23  Controlled on 3/2 with medications, discussed topical and oral pain medication options with family.  4. Mood: LCSW to follow for evaluation and support.              -antipsychotic agents: N/A 5. Neuropsych: This patient is not fully capable of making decisions on his own behalf.  Discussed with Neuropsych before/after eval - limited due to language, however, expected coping with loss 6. Skin/Wound Care: Routine pressure relief measures.   Continue Santyl to intergluteal ulcer 7. Fluids/Electrolytes/Nutrition: Monitor I/Os.  8. HTN: Monitor BP tid   HCTZ daily, DC'd on 2/7  Avapro decreased to 150 on 2/8  Catapres twice daily, Lopressor bid.    Slightly elevated on 2/1, will consider resuming medications if persistent 9. T2DM with hyperglycemia: Hgb A1c-6.2.   CBG (last 3)  Recent Labs    09/05/20 1642 09/05/20 2057 09/06/20 0608  GLUCAP 110* 148* 138*      Metformin 1000 mg bid  Farxiga restarted  Amaryl restarted on 2/23, increased to 1.5 on 3/2  Hypoglycemic to 46- decrease Amaryl to 1mg   CBGs 117-153:  Continue to monitor.  10. Covid 19--Positive  on 07/24/20-->no treatment as asymptomatic. Continue zinc and vitamin C.  11. Morbid obesity: BMI 46. Educate on appropriate diet and exercise to help promote health and mobility.  2/26- BMI down to 43- con't to work on this with diet/exercise              Encourage weight loss 12.  Hyperlipidemia: Continue Crestor 13. H/o gout: conitnue allopurinol.  14. Hyponatremia: Resolved 15. AKI:   Cr.  0.95 on 2/28  Received IVF x3 nights ended on 2/11   Echo: EF 60-65%  Continue to encourage fluids 16. Elevated liver enzymes: Resolved  ALT within normal limits on 2/11 17. Mild leukocytosis: Resolved  WBCs 8.2 on 2/14  Encourage IS as possible, OOB  See #18 18.  Acute lower UTI  UA +, urine culture showing E. coli  Completed course of Macrobid  20.  Acute blood loss anemia  Hemoglobin 12.0 on 2/28 21.  Left knee pain - acute on chronic  Xray showing OA   MRI personally reviewed showing OA.  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 Left ankle gout: colchicine started. Uric acid level 5.2 23. Disposition: f/u with PCP, Dr. Allena Katz, and [facility]   >30 minutes spent in discharge of patient including review of medications and follow-up appointments, physical examination, and in answering all patient's questions  LOS: 28 days A FACE TO FACE EVALUATION WAS PERFORMED  Drema Pry Gillie Crisci 09/06/2020, 8:47 AM

## 2020-09-06 NOTE — Plan of Care (Signed)
  Problem: Consults Goal: RH STROKE PATIENT EDUCATION Description: See Patient Education module for education specifics  Outcome: Completed/Met   Problem: RH BLADDER ELIMINATION Goal: RH STG MANAGE BLADDER WITH ASSISTANCE Description: STG Manage Bladder With min Assistance Outcome: Completed/Met   Problem: RH PAIN MANAGEMENT Goal: RH STG PAIN MANAGED AT OR BELOW PT'S PAIN GOAL Description: Pain level less than 4 on scale of 0-10 Outcome: Completed/Met   Problem: RH SKIN INTEGRITY Goal: RH STG MAINTAIN SKIN INTEGRITY WITH ASSISTANCE Description: STG Maintain Skin Integrity With min Assistance. Outcome: Completed/Met

## 2020-09-23 ENCOUNTER — Telehealth: Payer: Self-pay

## 2020-09-23 NOTE — Telephone Encounter (Signed)
Where is his pain?

## 2020-09-23 NOTE — Telephone Encounter (Signed)
Justin Mason wife called: She wanted to know if he can be prescribed pain medicine. Because he is in a lot of pain after physical therapy.   Current medications where reviewed with wife. Per wife he needs more pain relief.   Please advise or prescribe.

## 2020-09-24 NOTE — Telephone Encounter (Signed)
Pain is in the right shoulder & left knee.

## 2020-09-25 NOTE — Telephone Encounter (Signed)
Yes.  Thank you.

## 2020-09-25 NOTE — Telephone Encounter (Signed)
Patient is going to try increasing Gabapentin first before scheduling Zilretta injection Needs new prescription sent in and he also needs a refill on Santyl. If this ok I can call in both. [facility].

## 2020-09-25 NOTE — Telephone Encounter (Signed)
He can increase his Gabapentin to 300 TID and use the voltaren gel on his shoulder as well.  His knee has significant chronic abnormalities - we can schedule his for zilretta injection under ultrasound .  Thanks.

## 2020-09-26 MED ORDER — GABAPENTIN 300 MG PO CAPS: 300.0000 mg | ORAL_CAPSULE | Freq: Three times a day (TID) | ORAL | 1 refills | Status: DC

## 2020-09-26 MED ORDER — COLLAGENASE 250 UNIT/GM EX OINT: TOPICAL_OINTMENT | Freq: Every day | CUTANEOUS | 1 refills | Status: DC

## 2020-09-26 NOTE — Addendum Note (Signed)
Addended by: Silas Sacramento T on: 09/26/2020 02:00 PM   Modules accepted: Orders

## 2020-10-10 ENCOUNTER — Encounter
Payer: Managed Care, Other (non HMO) | Attending: Physical Medicine & Rehabilitation | Admitting: Physical Medicine & Rehabilitation

## 2020-10-10 ENCOUNTER — Encounter: Payer: Self-pay | Admitting: Physical Medicine & Rehabilitation

## 2020-10-10 ENCOUNTER — Other Ambulatory Visit: Payer: Self-pay

## 2020-10-10 VITALS — BP 115/82 | HR 86 | Temp 98.8°F | Ht 73.0 in

## 2020-10-10 DIAGNOSIS — I6300 Cerebral infarction due to thrombosis of unspecified precerebral artery: Secondary | ICD-10-CM

## 2020-10-10 DIAGNOSIS — R4701 Aphasia: Secondary | ICD-10-CM

## 2020-10-10 DIAGNOSIS — E1165 Type 2 diabetes mellitus with hyperglycemia: Secondary | ICD-10-CM

## 2020-10-10 DIAGNOSIS — I10 Essential (primary) hypertension: Secondary | ICD-10-CM

## 2020-10-10 MED ORDER — BACLOFEN 10 MG PO TABS: 10.0000 mg | ORAL_TABLET | Freq: Every day | ORAL | 1 refills | Status: DC

## 2020-10-10 NOTE — Progress Notes (Signed)
Subjective:    Patient ID: Justin Mason, male    DOB: [birth year], 40 y.o.   MRN: [id number]  HPI  Male with history of HTN, morbid obesity, T2DM, OSA, diagnosis of COVID-19 less than 1 week prior to admission on 07/24/2020 with dysarthria, right hemiparesis progressing to right facial droop with inability to speak presents for follow up for left M1 infarct.  Since discharge, communication exchanged regarding right arm and left knee pain.  Wife and mother provide history.  Wound is "great". CBGs have been relatively controlled. He has an appointment with Neuro. He saw PCP. He is still on DAPT. Elbow pain has improved. Left knee pain is controlled. Denies falls.  Therapies: 3/week with PT, 2/week SLP, 1/week OT DME: Wheelchair, bedside commode, trapeze bar, hoyer Mobility: Wheelchair at all times  Pain Inventory Average Pain 0 Pain Right Now 0 My pain is no pain  LOCATION OF PAIN  Left knee,shoulder,elbow,ankle leg,fingers  BOWEL Number of stools per week: 8 Oral laxative use No  Type of laxative N/A Enema or suppository use No  History of colostomy No  Incontinent Yes   BLADDER Pads In and out cath, frequency N/A Able to self cath No  Bladder incontinence Yes  Frequent urination No  Leakage with coughing No  Difficulty starting stream No  Incomplete bladder emptying No    Mobility do you drive?  no use a wheelchair  Function disabled: date disabled 07/24/20 I need assistance with the following:  dressing, bathing, toileting, meal prep, household duties and shopping  Neuro/Psych bladder control problems bowel control problems weakness trouble walking confusion depression  Prior Studies Any changes since last visit?  no  Physicians involved in your care Any changes since last visit?  no   Family History  Problem Relation Age of Onset  . Anesthesia problems Sister   . Prostate cancer Other   . Colon cancer Other    Social History   Socioeconomic  History  . Marital status: Married    Spouse name: Not on file  . Number of children: Not on file  . Years of education: Not on file  . Highest education level: Not on file  Occupational History  . Not on file  Tobacco Use  . Smoking status: Never Smoker  . Smokeless tobacco: Current User    Types: Snuff  Substance and Sexual Activity  . Alcohol use: Yes    Alcohol/week: 7.0 standard drinks    Types: 7 Shots of liquor per week  . Drug use: No  . Sexual activity: Yes  Other Topics Concern  . Not on file  Social History Narrative  . Not on file   Social Determinants of Health   Financial Resource Strain: Not on file  Food Insecurity: Not on file  Transportation Needs: Not on file  Physical Activity: Not on file  Stress: Not on file  Social Connections: Not on file   Past Surgical History:  Procedure Laterality Date  . INCISION AND DRAINAGE FOOT  1991-1992   STEPPED ON NAIL / INFECTED WOUND  . IR ANGIO INTRA EXTRACRAN SEL COM CAROTID INNOMINATE BILAT MOD SED  07/24/2020  . IR ANGIO VERTEBRAL SEL SUBCLAVIAN INNOMINATE UNI L MOD SED  07/24/2020  . IR ANGIO VERTEBRAL SEL VERTEBRAL UNI R MOD SED  07/24/2020  . RADIOLOGY WITH ANESTHESIA N/A 07/24/2020   Procedure: IR WITH ANESTHESIA - CODE STROKE;  Surgeon: Radiologist, Medication, MD;  Location: [facility] OR;  Service: Radiology;  Laterality: N/A;  .  SHOULDER SURGERY     September 08, 2011   Past Medical History:  Diagnosis Date  . AKI (acute kidney injury) (HCC)   . Arthritis   . Cardiac murmur 05/24/2020  . Cellulitis 01/03/2015  . Cellulitis of right leg   . Cellulitis of right lower extremity 01/03/2015  . Essential hypertension   . GERD (gastroesophageal reflux disease)   . High cholesterol   . Hyperlipemia   . Hypertension   . Morbid obesity (HCC) 05/24/2020  . OSA (obstructive sleep apnea)   . Rotator cuff tear, right 09/08/2011  . Sepsis (HCC) 01/03/2015  . Stroke (HCC)   . Type 2 diabetes mellitus with hyperglycemia (HCC)     Temp 98.8 F (37.1 C)   Ht 6\' 1"  (1.854 m)   BMI 43.34 kg/m   Opioid Risk Score:   Fall Risk Score:  `1  Depression screen PHQ 2/9  Depression screen PHQ 2/9 10/10/2020  Decreased Interest 3  Down, Depressed, Hopeless 1  PHQ - 2 Score 4  Altered sleeping 0  Tired, decreased energy 2  Change in appetite 0  Feeling bad or failure about yourself  0  Trouble concentrating 0  Moving slowly or fidgety/restless 0  Suicidal thoughts 0  PHQ-9 Score 6    Review of Systems  Unable to perform ROS: Other  Expressive aphasia    Objective:   Physical Exam  Constitutional: No distress . Vital signs reviewed. HENT: Normocephalic.  Atraumatic. Eyes: EOMI. No discharge. Cardiovascular: No JVD.   Respiratory: Normal effort.  No stridor.   GI: Non-distended.   Skin: Warm and dry.  Intact. Psych: Normal mood.  Normal behavior. Musc:  Right upper extremity edema, improving No left knee TTP Neuro: Alert Expressive aphasia, some improvement Motor: RUE should abduction, elbow flex/ext, wrist ext, hand grip 2/5 with apraxia RLE: HF 3/5, KE2-/5, ADF 3/5 with apraxia Some emerging tone in LUE/LLE    Assessment & Plan:  Male with history of HTN, morbid obesity, T2DM, OSA, diagnosis of COVID-19 less than 1 week prior to admission on 07/24/2020 with dysarthria, right hemiparesis progressing to right facial droop with inability to speak presents for follow up for left M1 infarct.  1.  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             Cont therapies  Follow up with Neuro  Baclofen 10mg  qhs  DAPT X 3 months followed by ASA alone started on 08/09/20  2. Neuropathic pain   Cont gabapentin 300mg  tid              3. HTN: Monitor BP tid              Cont meds  Controlled today  4. T2DM with hyperglycemia: Hgb A1c-6.2.  Cont meds  Controlled on today  5. Left knee  pain - acute on chronic - main with hyperflexion, extension when maneuvering wheelchair              Xray showing OA              MRI showing OA, complete ACL medial lateral menisci tears in addition to severe OA and lipoma arborescens  Cont Voltaren gel  Pt would like to trial steroid injection, although discussed less effective for ligamentous injury  PCP made referral to Ortho  6. Gait abnormality  Cont wheelchair for safety  Previous injury to left  lateral foot with surgery  Cont therapies

## 2020-10-25 ENCOUNTER — Telehealth: Payer: Self-pay | Admitting: Cardiology

## 2020-10-25 NOTE — Telephone Encounter (Signed)
Ok with me  Allyce Bochicchio MD, FACC  

## 2020-10-25 NOTE — Telephone Encounter (Signed)
Per referral, patient is trying to switch from Dr. Tomie China to Dr. [place]. Patient's mother see's Dr. [place].

## 2020-11-07 ENCOUNTER — Encounter
Payer: Managed Care, Other (non HMO) | Attending: Physical Medicine & Rehabilitation | Admitting: Physical Medicine & Rehabilitation

## 2020-11-07 ENCOUNTER — Other Ambulatory Visit: Payer: Self-pay

## 2020-11-07 ENCOUNTER — Encounter: Payer: Self-pay | Admitting: Physical Medicine & Rehabilitation

## 2020-11-07 VITALS — BP 137/91 | HR 88 | Temp 98.4°F

## 2020-11-07 DIAGNOSIS — M1712 Unilateral primary osteoarthritis, left knee: Secondary | ICD-10-CM | POA: Diagnosis present

## 2020-11-07 MED ORDER — BACLOFEN 10 MG PO TABS
10.0000 mg | ORAL_TABLET | Freq: Three times a day (TID) | ORAL | 2 refills | Status: DC
Start: 2020-11-07 — End: 2021-02-19

## 2020-11-07 NOTE — Progress Notes (Signed)
Ultrasound guided intraarticular Left  knee injection  Indication: Knee pain not relieved by medication management and other conservative care.  Informed consent was obtained after describing risks and benefits of the procedure with the patient, this includes bleeding, bruising, infection and medication side effects. The patient wishes to proceed and has given written consent. Patient was placed in seated position with the left knee straightened and slightly flexed. The left knee was marked and prepped with betadine superior to the TF tendon. The ultrasound tranducer was placed in long axis at the patellafemoral junction, visualizing the suprapatellar bursa.  The transducer was then changed to short axis view, keeping the suprapatellar bursa in view. Vapocoolant spray was applied.  A 22-gauge 3-1/8 inch echobloc needle, changed to a 22 gauge 2 inch needle was inserted into the suprapatellar bursa. After negative draw back for blood, a solution Zilretta, 5ccs was injected. A band aid was applied. The patient tolerated the procedure well. Post procedure instructions were given.  Patient instructed to increase Baclofen to 10mg  TID for spasms

## 2020-11-08 ENCOUNTER — Other Ambulatory Visit: Payer: Self-pay

## 2020-11-08 NOTE — Patient Outreach (Signed)
[facility]) Care Management  11/08/2020  Justin Mason 1980-09-12 [id number]   Telephone outreach to patient to obtain mRS was successfully completed. MRS= 5   [name] [facility] Care Management Assistant

## 2020-11-18 ENCOUNTER — Encounter: Payer: Self-pay | Admitting: Neurology

## 2020-11-18 ENCOUNTER — Ambulatory Visit (INDEPENDENT_AMBULATORY_CARE_PROVIDER_SITE_OTHER): Payer: 59 | Admitting: Neurology

## 2020-11-18 VITALS — BP 148/91 | HR 73 | Ht 73.0 in | Wt 320.0 lb

## 2020-11-18 DIAGNOSIS — I69320 Aphasia following cerebral infarction: Secondary | ICD-10-CM

## 2020-11-18 DIAGNOSIS — I63312 Cerebral infarction due to thrombosis of left middle cerebral artery: Secondary | ICD-10-CM

## 2020-11-18 DIAGNOSIS — I69351 Hemiplegia and hemiparesis following cerebral infarction affecting right dominant side: Secondary | ICD-10-CM

## 2020-11-18 NOTE — Patient Instructions (Signed)
I had a long d/w patient, girl friend and mom about his recent stroke,aphasia and hemiplegia, risk for recurrent stroke/TIAs, personally independently reviewed imaging studies and stroke evaluation results and answered questions.Continue aspirin 81 mg daily  for secondary stroke prevention but stop Plavix now as it has been more than 3 months since his stroke and maintain strict control of hypertension with blood pressure goal below 130/90, diabetes with hemoglobin A1c goal below 6.5% and lipids with LDL cholesterol goal below 70 mg/dL. I also advised the patient to eat a healthy diet with plenty of whole grains, cereals, fruits and vegetables, exercise regularly and maintain ideal body weight .continue ongoing home speech, physical and occupational therapy and when this is exhausted recommend outpatient therapies.  Followup in the future with my nurse practitioner Shanda Bumps in 6 months or call earlier if necessary.  Stroke Prevention Some medical conditions and behaviors are associated with a higher chance of having a stroke. You can help prevent a stroke by making nutrition, lifestyle, and other changes, including managing any medical conditions you may have. What nutrition changes can be made?  Eat healthy foods. You can do this by: ? Choosing foods high in fiber, such as fresh fruits and vegetables and whole grains. ? Eating at least 5 or more servings of fruits and vegetables a day. Try to fill half of your plate at each meal with fruits and vegetables. ? Choosing lean protein foods, such as lean cuts of meat, poultry without skin, fish, tofu, beans, and nuts. ? Eating low-fat dairy products. ? Avoiding foods that are high in salt (sodium). This can help lower blood pressure. ? Avoiding foods that have saturated fat, trans fat, and cholesterol. This can help prevent high cholesterol. ? Avoiding processed and premade foods.  Follow your health care provider's specific guidelines for losing weight,  controlling high blood pressure (hypertension), lowering high cholesterol, and managing diabetes. These may include: ? Reducing your daily calorie intake. ? Limiting your daily sodium intake to 1,500 milligrams (mg). ? Using only healthy fats for cooking, such as olive oil, canola oil, or sunflower oil. ? Counting your daily carbohydrate intake.   What lifestyle changes can be made?  Maintain a healthy weight. Talk to your health care provider about your ideal weight.  Get at least 30 minutes of moderate physical activity at least 5 days a week. Moderate activity includes brisk walking, biking, and swimming.  Do not use any products that contain nicotine or tobacco, such as cigarettes and e-cigarettes. If you need help quitting, ask your health care provider. It may also be helpful to avoid exposure to secondhand smoke.  Limit alcohol intake to no more than 1 drink a day for nonpregnant women and 2 drinks a day for men. One drink equals 12 oz of beer, 5 oz of wine, or 1 oz of hard liquor.  Stop any illegal drug use.  Avoid taking birth control pills. Talk to your health care provider about the risks of taking birth control pills if: ? You are over 86 years old. ? You smoke. ? You get migraines. ? You have ever had a blood clot. What other changes can be made?  Manage your cholesterol levels. ? Eating a healthy diet is important for preventing high cholesterol. If cholesterol cannot be managed through diet alone, you may also need to take medicines. ? Take any prescribed medicines to control your cholesterol as told by your health care provider.  Manage your diabetes. ? Eating a healthy  diet and exercising regularly are important parts of managing your blood sugar. If your blood sugar cannot be managed through diet and exercise, you may need to take medicines. ? Take any prescribed medicines to control your diabetes as told by your health care provider.  Control your  hypertension. ? To reduce your risk of stroke, try to keep your blood pressure below 130/80. ? Eating a healthy diet and exercising regularly are an important part of controlling your blood pressure. If your blood pressure cannot be managed through diet and exercise, you may need to take medicines. ? Take any prescribed medicines to control hypertension as told by your health care provider. ? Ask your health care provider if you should monitor your blood pressure at home. ? Have your blood pressure checked every year, even if your blood pressure is normal. Blood pressure increases with age and some medical conditions.  Get evaluated for sleep disorders (sleep apnea). Talk to your health care provider about getting a sleep evaluation if you snore a lot or have excessive sleepiness.  Take over-the-counter and prescription medicines only as told by your health care provider. Aspirin or blood thinners (antiplatelets or anticoagulants) may be recommended to reduce your risk of forming blood clots that can lead to stroke.  Make sure that any other medical conditions you have, such as atrial fibrillation or atherosclerosis, are managed. What are the warning signs of a stroke? The warning signs of a stroke can be easily remembered as BEFAST.  B is for balance. Signs include: ? Dizziness. ? Loss of balance or coordination. ? Sudden trouble walking.  E is for eyes. Signs include: ? A sudden change in vision. ? Trouble seeing.  F is for face. Signs include: ? Sudden weakness or numbness of the face. ? The face or eyelid drooping to one side.  A is for arms. Signs include: ? Sudden weakness or numbness of the arm, usually on one side of the body.  S is for speech. Signs include: ? Trouble speaking (aphasia). ? Trouble understanding.  T is for time. ? These symptoms may represent a serious problem that is an emergency. Do not wait to see if the symptoms will go away. Get medical help right  away. Call your local emergency services (911 in the [place] Do not drive yourself to the hospital.  Other signs of stroke may include: ? A sudden, severe headache with no known cause. ? Nausea or vomiting. ? Seizure. Where to find more information For more information, visit:  American Stroke Association: www.strokeassociation.org  National Stroke Association: www.stroke.org Summary  You can prevent a stroke by eating healthy, exercising, not smoking, limiting alcohol intake, and managing any medical conditions you may have.  Do not use any products that contain nicotine or tobacco, such as cigarettes and e-cigarettes. If you need help quitting, ask your health care provider. It may also be helpful to avoid exposure to secondhand smoke.  Remember BEFAST for warning signs of stroke. Get help right away if you or a loved one has any of these signs. This information is not intended to replace advice given to you by your health care provider. Make sure you discuss any questions you have with your health care provider. Document Revised: 06/04/2017 Document Reviewed: 07/28/2016 Elsevier Patient Education  2021 [employer].

## 2020-11-18 NOTE — Progress Notes (Signed)
[facility] [address] [place] [id number] [phone]       OFFICE FOLLOW-UP NOTE  Justin Mason Date of Birth:  [birth year] Medical Record Number:  [id number]   HPI: Justin Mason is a 40 year old Caucasian male seen today for initial office follow-up visit following hospital admission for stroke in January 2022.  Is accompanied by his girlfriend and mother today.  History is obtained from them and review of electronic medical records and I personally reviewed pertinent available imaging films in PACS.  He has past medical history of morbid obesity, hypertension, hyperlipidemia, arthritis and diabetes.  He presented on 07/24/2020 with sudden onset of difficulty speaking that occurred at 10:30 in the morning on day of admission.  He was brought to the hospital and was noted to be aphasic and a code stroke was activated.  He underwent emergent CT angiogram which showed short segment occlusion of the proximal right M1 MCA with decreased caliber and opacification of the corresponding vascular tree.  Occlusion of the terminal left ICA and proximal left M1 segment with decreased caliber and contrast appropriate opacification.  Luminal irregularity of bilateral ACA and PCA vascular territories with moderate stenosis of the right P2/P3 segment.  CT perfusion suggestive of large area of hypoperfusion without core infarct in the left and hence was taken for emergent mechanical thrombectomy but unfortunately at the time of the angiogram it was discovered that the blockage was likely chronic in this thrombectomy could not be performed.  He was also found to be COVID-positive.  He was intubated and on mechanical ventilatory support for a few days in the ICU.  He was started on aspirin and Plavix for his MCA occlusion.  Chest x-ray showed patchy airspace disease right greater than left with suggestive multifocal infection from COVID.  Echocardiogram showed ejection fraction of 60 to 65%  without definite cardiac source of embolism.  MRI scan showed acute left MCA infarct with occlusion of the left ICA terminus and left M1 segment with diminished opacification of distal left MCA branches.  LDL cholesterol was 36 mg percent and hemoglobin A1c was 6.2.  Patient was started on aspirin 81 mg and Plavix 75 mg daily for 3 months.  He was transferred to inpatient rehab where he stayed till 09/06/2020 and then was discharged home.  He is currently living at home.  He is getting home physical occupational and speech therapy.  His speech is improving and is able to speak many words and short sentences up to 9 words.  Complaints and is good.  Still has significant right hemiplegia is able to barely bend his right hand fingers and has no usable function in the right hand.  Is able to lift his right leg off the bed but is still not able to bear weight.  Even with physical therapist he can barely stand for a few moments.  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  Patient however can help with transfers.  He is  continent for his bladder and bowel function.  Is tolerating aspirin Plavix well without only minor bruising and no bleeding episodes.  His blood pressure is usually well controlled and today it is borderline in office at 148/91.  His had lipid profile checked by his primary care physician last month but I do not have the results.  He is on Crestor 10 mg which is tolerating well.  ROS:   14 system review of systems  is positive for speech difficulty, weakness, knee pain, gait difficulty all other systems negative  PMH:  Past Medical History:  Diagnosis Date  . AKI (acute kidney injury) (HCC)   . Arthritis   . Cardiac murmur 05/24/2020  . Cellulitis 01/03/2015  . Cellulitis of right leg   . Cellulitis of right lower extremity 01/03/2015  . Essential hypertension   . GERD (gastroesophageal reflux disease)   . High cholesterol   . Hyperlipemia   .  Hypertension   . Morbid obesity (HCC) 05/24/2020  . OSA (obstructive sleep apnea)   . Rotator cuff tear, right 09/08/2011  . Sepsis (HCC) 01/03/2015  . Stroke (HCC)   . Type 2 diabetes mellitus with hyperglycemia (HCC)     Social History:  Social History   Socioeconomic History  . Marital status: Significant Other    Spouse name: Not on file  . Number of children: Not on file  . Years of education: Not on file  . Highest education level: Not on file  Occupational History  . Not on file  Tobacco Use  . Smoking status: Never Smoker  . Smokeless tobacco: Current User    Types: Snuff  Substance and Sexual Activity  . Alcohol use: Yes    Alcohol/week: 7.0 standard drinks    Types: 7 Shots of liquor per week  . Drug use: No  . Sexual activity: Yes  Other Topics Concern  . Not on file  Social History Narrative   Lives with mom and girlfriend   Right Handed   Drinks caffeinated tea   Social Determinants of Health   Financial Resource Strain: Not on file  Food Insecurity: Not on file  Transportation Needs: Not on file  Physical Activity: Not on file  Stress: Not on file  Social Connections: Not on file  Intimate Partner Violence: Not on file    Medications:   Current Outpatient Medications on File Prior to Visit  Medication Sig Dispense Refill  . acetaminophen (TYLENOL) 325 MG tablet Take 1-2 tablets (325-650 mg total) by mouth every 4 (four) hours as needed for mild pain.    Marland Kitchen allopurinol (ZYLOPRIM) 100 MG tablet Take 100 mg by mouth as needed (gout).    . Ascorbic Acid (VITAMIN C PO) Take 1 tablet by mouth every evening.    Marland Kitchen aspirin EC 81 MG EC tablet Take 1 tablet (81 mg total) by mouth daily. Swallow whole. 100 tablet 11  . baclofen (LIORESAL) 10 MG tablet Take 1 tablet (10 mg total) by mouth 3 (three) times daily. 90 each 2  . Cholecalciferol (VITAMIN D-3) 25 MCG (1000 UT) CAPS Take 1,000 Units by mouth every evening.    . cloNIDine (CATAPRES) 0.1 MG tablet Take 1  tablet (0.1 mg total) by mouth 2 (two) times daily. 60 tablet 0  . clopidogrel (PLAVIX) 75 MG tablet Take 1 tablet (75 mg total) by mouth daily. 30 tablet 0  . colchicine 0.6 MG tablet Take 1 tablet (0.6 mg total) by mouth daily. 7 tablet 0  . collagenase (SANTYL) ointment APPLY TOPICALLY DAILY. 30 g 0  . dapagliflozin propanediol (FARXIGA) 10 MG TABS tablet Take 1 tablet (10 mg total) by mouth daily. 30 tablet 0  . diclofenac Sodium (VOLTAREN) 1 % GEL Apply 2 g topically 4 (four) times daily. 350 g 0  . glimepiride (AMARYL) 1 MG tablet Take 1 tablet (1 mg total) by mouth daily with breakfast. 30 tablet 0  . irbesartan (AVAPRO) 150 MG tablet Take  1 tablet (150 mg total) by mouth daily. 30 tablet 0  . metFORMIN (GLUCOPHAGE) 1000 MG tablet Take 1 tablet (1,000 mg total) by mouth 2 (two) times daily. 60 tablet 0  . metoprolol tartrate (LOPRESSOR) 100 MG tablet Take 1 tablet (100 mg total) by mouth 2 (two) times daily. 60 tablet 0  . metoprolol tartrate (LOPRESSOR) 100 MG tablet TAKE 1 TABLET (100 MG TOTAL) BY MOUTH 2 (TWO) TIMES DAILY. 60 tablet 0  . Multiple Vitamin (MULTIVITAMIN WITH MINERALS) TABS tablet Take 1 tablet by mouth daily. 30 tablet 0  . pantoprazole (PROTONIX) 40 MG tablet Take 1 tablet (40 mg total) by mouth daily. 30 tablet 0  . rosuvastatin (CRESTOR) 10 MG tablet Take 1 tablet (10 mg total) by mouth daily. 30 tablet 0  . sertraline (ZOLOFT) 25 MG tablet Take 1 tablet (25 mg total) by mouth daily. 30 tablet 0  . Zinc 50 MG TABS Take 200 mg by mouth every evening.    Marland Kitchen aspirin 81 MG EC tablet TAKE 1 TABLET (81 MG TOTAL) BY MOUTH DAILY. SWALLOW WHOLE. (Patient taking differently: Take by mouth 5 (five) times daily. SWALLOW WHOLE.) 100 tablet 11  . cloNIDine (CATAPRES) 0.1 MG tablet TAKE 1 TABLET (0.1 MG TOTAL) BY MOUTH 2 (TWO) TIMES DAILY. 60 tablet 0  . clopidogrel (PLAVIX) 75 MG tablet TAKE 1 TABLET (75 MG TOTAL) BY MOUTH DAILY. 30 tablet 0  . colchicine 0.6 MG tablet TAKE 1 TABLET  (0.6 MG TOTAL) BY MOUTH DAILY. 7 tablet 0  . collagenase (SANTYL) ointment Apply topically daily. 15 g 1  . dapagliflozin propanediol (FARXIGA) 10 MG TABS tablet TAKE 1 TABLET (10 MG TOTAL) BY MOUTH DAILY. 30 tablet 0  . diclofenac Sodium (VOLTAREN) 1 % GEL APPLY 2 G TOPICALLY 4 (FOUR) TIMES DAILY. 300 g 0  . gabapentin (NEURONTIN) 100 MG capsule TAKE 1 CAPSULE (100 MG TOTAL) BY MOUTH 3 (THREE) TIMES DAILY. 90 capsule 0  . gabapentin (NEURONTIN) 300 MG capsule Take 1 capsule (300 mg total) by mouth 3 (three) times daily. 90 capsule 1  . glimepiride (AMARYL) 2 MG tablet TAKE 1/2 TABLET (1 MG TOTAL) BY MOUTH DAILY WITH BREAKFAST. 15 tablet 0  . irbesartan (AVAPRO) 150 MG tablet TAKE 1 TABLET (150 MG TOTAL) BY MOUTH DAILY. 30 tablet 0  . metFORMIN (GLUCOPHAGE) 1000 MG tablet TAKE 1 TABLET (1,000 MG TOTAL) BY MOUTH 2 (TWO) TIMES DAILY. 60 tablet 0  . nutrition supplement, JUVEN, (JUVEN) PACK Take 1 packet by mouth 2 (two) times daily between meals. 60 each 0  . nutrition supplement, JUVEN, (JUVEN) PACK TAKE 1 PACKET BY MOUTH 2 (TWO) TIMES DAILY BETWEEN MEALS. 60 each 0  . pantoprazole (PROTONIX) 40 MG tablet TAKE 1 TABLET (40 MG TOTAL) BY MOUTH DAILY. 30 tablet 0  . rosuvastatin (CRESTOR) 10 MG tablet TAKE 1 TABLET (10 MG TOTAL) BY MOUTH DAILY. 30 tablet 0  . sertraline (ZOLOFT) 25 MG tablet TAKE 1 TABLET (25 MG TOTAL) BY MOUTH DAILY. 30 tablet 0   No current facility-administered medications on file prior to visit.    Allergies:  No Known Allergies  Physical Exam General: Obese young Caucasian male seated, in no evident distress Head: head normocephalic and atraumatic.  Neck: supple with no carotid or supraclavicular bruits Cardiovascular: regular rate and rhythm, no murmurs Musculoskeletal: no deformity Skin:  no rash/petichiae Vascular:  Normal pulses all extremities Vitals:   11/18/20 1037  BP: (!) 148/91  Pulse: 73   Neurologic Exam  Mental Status: Awake and fully alert. Oriented to  place and time. Recent and remote memory intact. Attention span, concentration and fund of knowledge difficult to assess fully due to aphasia.  Moderate expressive aphasia.  Mild dysarthria. Mood and affect appropriate.  Cranial Nerves: Fundoscopic exam reveals sharp disc margins. Pupils equal, briskly reactive to light. Extraocular movements full without nystagmus. Visual fields full to confrontation. Hearing intact. Facial sensation intact.  Mild right lower facial asymmetry when he smiles., tongue, palate moves normally and symmetrically.  Motor: Normal bulk and tone. Normal strength on the left side.  Spastic right hemiplegia with grade 1/5 strength in the right upper extremity and is able to bend fingers only and 2-3/5 strength in the right lower extremity with increased tone.Marland Kitchen  His right foot drop. Sensory.: intact to touch ,pinprick .position and vibratory sensation.  Coordination: Rapid alternating movements normal i on the left side and cannot be tested on the right due to weakness  gait and Station: Unable to arise from a chair even with two-person assist hence deferred.   Reflexes: 1+ and asymmetric and brisker on the right. Toes downgoing.   NIHSS  9 Modified Rankin  4   ASSESSMENT: 40 year old Caucasian male with left middle cerebral artery infarction and January 2022 due to left middle cerebral artery occlusion s/p attempted unsuccessful mechanical thrombectomy with also diffuse intracranial multi vessel atherosclerosis with significant residual aphasia and right hemiplegia.  Vascular risk factors of obesity, diabetes, hypertension and hyperlipidemia.     PLAN: I had a long d/w patient, girl friend and mom about his recent stroke,aphasia and hemiplegia, risk for recurrent stroke/TIAs, personally independently reviewed imaging studies and stroke evaluation results and answered questions.Continue aspirin 81 mg daily  for secondary stroke prevention but stop Plavix now as it has been  more than 3 months since his stroke and maintain strict control of hypertension with blood pressure goal below 130/90, diabetes with hemoglobin A1c goal below 6.5% and lipids with LDL cholesterol goal below 70 mg/dL. I also advised the patient to eat a healthy diet with plenty of whole grains, cereals, fruits and vegetables, exercise regularly and maintain ideal body weight .continue ongoing home speech, physical and occupational therapy and when this is exhausted recommend outpatient therapies.  Followup in the future with my nurse practitioner Shanda Bumps in 6 months or call earlier if necessary.Greater than 50% of time during this 35 minute visit was spent on counseling,explanation of diagnosis, planning of further management, discussion with patient and family and coordination of care Delia Heady, MD Note: This document was prepared with digital dictation and possible smart phrase technology. Any transcriptional errors that result from this process are unintentional

## 2020-12-03 ENCOUNTER — Other Ambulatory Visit: Payer: Self-pay | Admitting: Physical Medicine & Rehabilitation

## 2020-12-05 ENCOUNTER — Other Ambulatory Visit: Payer: Self-pay

## 2020-12-05 ENCOUNTER — Encounter: Payer: Self-pay | Admitting: Physician Assistant

## 2020-12-05 ENCOUNTER — Ambulatory Visit (INDEPENDENT_AMBULATORY_CARE_PROVIDER_SITE_OTHER): Payer: 59 | Admitting: Physician Assistant

## 2020-12-05 VITALS — BP 152/88 | HR 61 | Ht 73.0 in | Wt 328.0 lb

## 2020-12-05 DIAGNOSIS — I10 Essential (primary) hypertension: Secondary | ICD-10-CM

## 2020-12-05 DIAGNOSIS — E119 Type 2 diabetes mellitus without complications: Secondary | ICD-10-CM

## 2020-12-05 DIAGNOSIS — E785 Hyperlipidemia, unspecified: Secondary | ICD-10-CM

## 2020-12-05 DIAGNOSIS — U071 COVID-19: Secondary | ICD-10-CM

## 2020-12-05 DIAGNOSIS — Z8673 Personal history of transient ischemic attack (TIA), and cerebral infarction without residual deficits: Secondary | ICD-10-CM | POA: Diagnosis not present

## 2020-12-05 MED ORDER — IRBESARTAN 300 MG PO TABS: 300.0000 mg | ORAL_TABLET | Freq: Every day | ORAL | 3 refills | Status: DC

## 2020-12-05 NOTE — Patient Instructions (Addendum)
Medication Instructions:   INCREASE Irbesartan to 300 mg daily  *If you need a refill on your cardiac medications before your next appointment, please call your pharmacy*  Lab Work: NONE ordered at this time of appointment   If you have labs (blood work) drawn today and your tests are completely normal, you will receive your results only by: Marland Kitchen MyChart Message (if you have MyChart) OR . A paper copy in the mail If you have any lab test that is abnormal or we need to change your treatment, we will call you to review the results.  Testing/Procedures: NONE ordered at this time of appointment   Follow-Up: At [facility], you and your health needs are our priority.  As part of our continuing mission to provide you with exceptional heart care, we have created designated Provider Care Teams.  These Care Teams include your primary Cardiologist (physician) and Advanced Practice Providers (APPs -  Physician Assistants and Nurse Practitioners) who all work together to provide you with the care you need, when you need it.  Your next appointment:   4 month(s) 1 month(s)   The format for your next appointment:   In Person In Person  Provider:   Peter [place], MD [facility]   Other Instructions

## 2020-12-05 NOTE — Progress Notes (Signed)
Cardiology Office Note:    Date:  12/07/2020   ID:  Justin Mason, DOB 1981/04/26, MRN [id number]  PCP:  Justin Jones, NP   [facility] Providers Cardiologist:  Justin Swaziland, MD { Referring MD: Justin Severin, NP   Chief Complaint  Patient presents with  . Follow-up    Seen for Dr. Swaziland  . Hospitalization Follow-up    Post stroke    History of Present Illness:    Justin Mason is a 40 y.o. male with a hx of hypertension, hyperlipidemia, DM2, recent CVA, obstructive sleep apnea and morbid obesity.  He has a history of difficult to control high blood pressure and has been followed by Justin Mason.  Echocardiogram obtained on 07/04/2020 showed EF 60 to 65%, grade 1 DD.  His last visit with Justin Mason was in January 2022, he was started on clonidine 0.1 mg twice a day.  He was diagnosed with COVID-19 infection in January and subsequently went to the hospital on 07/24/2020 with aphasia, right facial droop and right upper extremity weakness.  CT was concerning for left sided infarct.  CTA showed short segment occlusion in MCA, left ICA occlusion proximal left M1 and stenosis of PCAs.  He was outside the window for tPA.  Attempted angioplasty of MCA by interventional radiology was unsuccessful due to resistance, suspect occlusion to be chronic.  Repeat echocardiogram shows EF of 60 to 65%.  He was prescribed aspirin and Plavix with plan to continue for 3 months then transition to aspirin alone.  During inpatient rehab, Justin Mason was discontinued Justin Mason was decreased to 150 mg daily to avoid hypotension.  Diabetic medication was reduced due to periods of hypoglycemic episodes.  Bilateral lower extremity Doppler was negative for DVT.  Patient was treated with 7 days of antibiotic due to E. coli UTI.  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  He also had a gout flare during recovery as well.  Patient was  eventually discharged following inpatient rehab in early March 2022.  Plavix was discontinued on 5/16 after seeing Dr. Pearlean Mason for follow-up.  Patient presents today for follow-up.  He denies any recent chest pain or worsening dyspnea.  He continues to have right upper extremity and lower extremity weakness and dysarthria.  He is going through physical therapy at home.  Since discharge, his blood pressure was initially normal in April, however since May his blood pressure has been creeping up.  His girlfriend mentions that his blood pressure ranges between 130s to 170s at home.  I plan to increase his Justin Mason to 300 mg daily.  He will need a hypertension clinic follow-up in 1 month.  Blood pressure goal is 120/80.  He can follow-up with Dr. Swaziland in 4 months.   Past Medical History:  Diagnosis Date  . AKI (acute kidney injury) (HCC)   . Arthritis   . Cardiac murmur 05/24/2020  . Cellulitis 01/03/2015  . Cellulitis of right leg   . Cellulitis of right lower extremity 01/03/2015  . Essential hypertension   . GERD (gastroesophageal reflux disease)   . High cholesterol   . Hyperlipemia   . Hypertension   . Morbid obesity (HCC) 05/24/2020  . OSA (obstructive sleep apnea)   . Rotator cuff tear, right 09/08/2011  . Sepsis (HCC) 01/03/2015  . Stroke (HCC)   . Type 2 diabetes mellitus with hyperglycemia (HCC)  Past Surgical History:  Procedure Laterality Date  . INCISION AND DRAINAGE FOOT  1991-1992   STEPPED ON NAIL / INFECTED WOUND  . IR ANGIO INTRA EXTRACRAN SEL COM CAROTID INNOMINATE BILAT MOD SED  07/24/2020  . IR ANGIO VERTEBRAL SEL SUBCLAVIAN INNOMINATE UNI L MOD SED  07/24/2020  . IR ANGIO VERTEBRAL SEL VERTEBRAL UNI R MOD SED  07/24/2020  . RADIOLOGY WITH ANESTHESIA N/A 07/24/2020   Procedure: IR WITH ANESTHESIA - CODE STROKE;  Surgeon: Radiologist, Medication, MD;  Location: [facility] OR;  Service: Radiology;  Laterality: N/A;  . SHOULDER SURGERY     September 08, 2011    Current  Medications: Current Meds  Medication Sig  . acetaminophen (TYLENOL) 325 MG tablet Take 1-2 tablets (325-650 mg total) by mouth every 4 (four) hours as needed for mild pain.  Marland Kitchen. allopurinol (ZYLOPRIM) 100 MG tablet Take 100 mg by mouth as needed (gout).  . Ascorbic Acid (VITAMIN C PO) Take 1 tablet by mouth every evening.  Marland Kitchen. aspirin 81 MG EC tablet TAKE 1 TABLET (81 MG TOTAL) BY MOUTH DAILY. SWALLOW WHOLE. (Patient taking differently: Take by mouth 5 (five) times daily. SWALLOW WHOLE.)  . aspirin EC 81 MG EC tablet Take 1 tablet (81 mg total) by mouth daily. Swallow whole.  . baclofen (LIORESAL) 10 MG tablet Take 1 tablet (10 mg total) by mouth 3 (three) times daily.  . Cholecalciferol (VITAMIN D-3) 25 MCG (1000 UT) CAPS Take 1,000 Units by mouth every evening.  . cloNIDine (CATAPRES) 0.1 MG tablet Take 1 tablet (0.1 mg total) by mouth 2 (two) times daily.  . colchicine 0.6 MG tablet Take 1 tablet (0.6 mg total) by mouth daily.  . collagenase (SANTYL) ointment APPLY TOPICALLY DAILY.  . dapagliflozin propanediol (FARXIGA) 10 MG TABS tablet Take 1 tablet (10 mg total) by mouth daily.  . diclofenac Sodium (VOLTAREN) 1 % GEL Apply 2 g topically 4 (four) times daily.  Marland Kitchen. gabapentin (NEURONTIN) 300 MG capsule TAKE 1 CAPSULE BY MOUTH THREE TIMES DAILY  . glimepiride (AMARYL) 1 MG tablet Take 1 tablet (1 mg total) by mouth daily with breakfast.  . metFORMIN (GLUCOPHAGE) 1000 MG tablet TAKE 1 TABLET (1,000 MG TOTAL) BY MOUTH 2 (TWO) TIMES DAILY.  . metoprolol tartrate (LOPRESSOR) 100 MG tablet TAKE 1 TABLET (100 MG TOTAL) BY MOUTH 2 (TWO) TIMES DAILY.  . Multiple Vitamin (MULTIVITAMIN WITH MINERALS) TABS tablet Take 1 tablet by mouth daily.  . pantoprazole (PROTONIX) 40 MG tablet TAKE 1 TABLET (40 MG TOTAL) BY MOUTH DAILY.  . rosuvastatin (CRESTOR) 10 MG tablet Take 1 tablet (10 mg total) by mouth daily.  . sertraline (ZOLOFT) 25 MG tablet TAKE 1 TABLET (25 MG TOTAL) BY MOUTH DAILY.  Marland Kitchen. Zinc 50 MG TABS  Take 200 mg by mouth every evening.  . [DISCONTINUED] cloNIDine (CATAPRES) 0.1 MG tablet TAKE 1 TABLET (0.1 MG TOTAL) BY MOUTH 2 (TWO) TIMES DAILY.  . [DISCONTINUED] clopidogrel (PLAVIX) 75 MG tablet Take 1 tablet (75 mg total) by mouth daily.  . [DISCONTINUED] clopidogrel (PLAVIX) 75 MG tablet TAKE 1 TABLET (75 MG TOTAL) BY MOUTH DAILY.  . [DISCONTINUED] colchicine 0.6 MG tablet TAKE 1 TABLET (0.6 MG TOTAL) BY MOUTH DAILY.  . [DISCONTINUED] collagenase (SANTYL) ointment Apply topically daily.  . [DISCONTINUED] dapagliflozin propanediol (FARXIGA) 10 MG TABS tablet TAKE 1 TABLET (10 MG TOTAL) BY MOUTH DAILY.  . [DISCONTINUED] diclofenac Sodium (VOLTAREN) 1 % GEL APPLY 2 G TOPICALLY 4 (FOUR) TIMES DAILY.  . [DISCONTINUED] glimepiride (  AMARYL) 2 MG tablet TAKE 1/2 TABLET (1 MG TOTAL) BY MOUTH DAILY WITH BREAKFAST.  . [DISCONTINUED] Justin Mason (Justin Mason) 150 MG tablet Take 1 tablet (150 mg total) by mouth daily.  . [DISCONTINUED] Justin Mason (Justin Mason) 150 MG tablet TAKE 1 TABLET (150 MG TOTAL) BY MOUTH DAILY.  . [DISCONTINUED] metFORMIN (GLUCOPHAGE) 1000 MG tablet Take 1 tablet (1,000 mg total) by mouth 2 (two) times daily.  . [DISCONTINUED] metoprolol tartrate (LOPRESSOR) 100 MG tablet Take 1 tablet (100 mg total) by mouth 2 (two) times daily.  . [DISCONTINUED] nutrition supplement, JUVEN, (JUVEN) PACK Take 1 packet by mouth 2 (two) times daily between meals.  . [DISCONTINUED] nutrition supplement, JUVEN, (JUVEN) PACK TAKE 1 PACKET BY MOUTH 2 (TWO) TIMES DAILY BETWEEN MEALS.  . [DISCONTINUED] pantoprazole (PROTONIX) 40 MG tablet Take 1 tablet (40 mg total) by mouth daily.  . [DISCONTINUED] rosuvastatin (CRESTOR) 10 MG tablet TAKE 1 TABLET (10 MG TOTAL) BY MOUTH DAILY.  . [DISCONTINUED] sertraline (ZOLOFT) 25 MG tablet Take 1 tablet (25 mg total) by mouth daily.     Allergies:   Patient has no known allergies.   Social History   Socioeconomic History  . Marital status: Significant Other    Spouse  name: Not on file  . Number of children: Not on file  . Years of education: Not on file  . Highest education level: Not on file  Occupational History  . Not on file  Tobacco Use  . Smoking status: Never Smoker  . Smokeless tobacco: Current User    Types: Snuff  Substance and Sexual Activity  . Alcohol use: Yes    Alcohol/week: 7.0 standard drinks    Types: 7 Shots of liquor per week  . Drug use: No  . Sexual activity: Yes  Other Topics Concern  . Not on file  Social History Narrative   Lives with mom and girlfriend   Right Handed   Drinks caffeinated tea   Social Determinants of Health   Financial Resource Strain: Not on file  Food Insecurity: Not on file  Transportation Needs: Not on file  Physical Activity: Not on file  Stress: Not on file  Social Connections: Not on file     Family History: The patient's family history includes Anesthesia problems in his sister; Colon cancer in an other family member; Prostate cancer in an other family member.  ROS:   Please see the history of present illness.     All other systems reviewed and are negative.  EKGs/Labs/Other Studies Reviewed:    The following studies were reviewed today:  Echo 07/25/2020 1. Left ventricular ejection fraction, by estimation, is 60 to 65%. The  left ventricle has normal function. The left ventricle has no regional  wall motion abnormalities. There is moderate concentric left ventricular  hypertrophy. Left ventricular  diastolic parameters are indeterminate.  2. Right ventricular systolic function is normal. The right ventricular  size is normal.  3. The mitral valve is normal in structure. No evidence of mitral valve  regurgitation. No evidence of mitral stenosis.  4. The aortic valve is tricuspid. Aortic valve regurgitation is not  visualized. No aortic stenosis is present.  5. The inferior vena cava is dilated in size with >50% respiratory  variability, suggesting right atrial pressure  of 8 mmHg.   Comparison(s): No significant change from prior study.   EKG:  EKG is not ordered today.    Recent Labs: 08/08/2020: Magnesium 1.9 08/16/2020: ALT 31 09/02/2020: BUN 13; Creatinine, Ser 0.95; Hemoglobin  12.0; Platelets 259; Potassium 3.9; Sodium 138  Recent Lipid Panel    Component Value Date/Time   CHOL 93 07/25/2020 0515   TRIG 190 (H) 07/25/2020 0515   HDL 19 (L) 07/25/2020 0515   CHOLHDL 4.9 07/25/2020 0515   VLDL 38 07/25/2020 0515   LDLCALC 36 07/25/2020 0515     Risk Assessment/Calculations:       Physical Exam:    VS:  BP (!) 152/88   Pulse 61   Ht  (1.854 m)   Wt (!) 328 lb (148.8 kg)   SpO2 99%   BMI 43.27 kg/m     Wt Readings from Last 3 Encounters:  12/05/20 (!) 328 lb (148.8 kg)  11/18/20 (!) 320 lb (145.2 kg)  08/30/20 (!) 328 lb 7.8 oz (149 kg)     GEN:  Well nourished, well developed in no acute distress HEENT: Normal NECK: No JVD; No carotid bruits LYMPHATICS: No lymphadenopathy CARDIAC: RRR, no murmurs, rubs, gallops RESPIRATORY:  Clear to auscultation without rales, wheezing or rhonchi  ABDOMEN: Soft, non-tender, non-distended MUSCULOSKELETAL:  No edema; No deformity  SKIN: Warm and dry NEUROLOGIC:  Alert and oriented x 3 PSYCHIATRIC:  Normal affect   ASSESSMENT:    1. H/O: CVA (cerebrovascular accident)   2. COVID-19   3. Essential hypertension   4. Hyperlipidemia LDL goal <70   5. Controlled type 2 diabetes mellitus without complication, without long-term current use of insulin (HCC)   6. Morbid obesity (HCC)    PLAN:    In order of problems listed above:  1. History of CVA: Unfortunately occurred in the setting of COVID-19 infection.  Patient is still have right-sided weakness.  During physical therapy at home  2. COVID-19 infection: Diagnosed in January 2022.   3. Hypertension: Blood pressure elevated, will increase Justin Mason to 300 mg daily. Follow-up with hypertension clinic in 1 month and Dr. Swaziland in 4  months.  4. Hyperlipidemia: Continue Crestor  5. DM2: Managed by primary care provider  6. Morbid obesity: Weight loss difficult as patient still have poor control of the right side of the body after stroke.        Medication Adjustments/Labs and Tests Ordered: Current medicines are reviewed at length with the patient today.  Concerns regarding medicines are outlined above.  No orders of the defined types were placed in this encounter.  Meds ordered this encounter  Medications  . Justin Mason (Justin Mason) 300 MG tablet    Sig: Take 1 tablet (300 mg total) by mouth daily.    Dispense:  90 tablet    Refill:  3    Patient Instructions  Medication Instructions:   INCREASE Justin Mason to 300 mg daily  *If you need a refill on your cardiac medications before your next appointment, please call your pharmacy*  Lab Work: NONE ordered at this time of appointment   If you have labs (blood work) drawn today and your tests are completely normal, you will receive your results only by: Marland Kitchen MyChart Message (if you have MyChart) OR . A paper copy in the mail If you have any lab test that is abnormal or we need to change your treatment, we will call you to review the results.  Testing/Procedures: NONE ordered at this time of appointment   Follow-Up: At [facility], you and your health needs are our priority.  As part of our continuing mission to provide you with exceptional heart care, we have created designated Provider Care Teams.  These Care  Teams include your primary Cardiologist (physician) and Advanced Practice Providers (APPs -  Physician Assistants and Nurse Practitioners) who all work together to provide you with the care you need, when you need it.  Your next appointment:   4 month(s) 1 month(s)   The format for your next appointment:   In Person In Person  Provider:   Peter Swaziland, MD [facility]   Other Instructions      Signed, Azalee Course, [place]  12/07/2020 11:53  PM    [facility]

## 2020-12-07 ENCOUNTER — Encounter: Payer: Self-pay | Admitting: Physician Assistant

## 2021-01-10 ENCOUNTER — Ambulatory Visit: Payer: 59

## 2021-02-03 ENCOUNTER — Other Ambulatory Visit: Payer: Self-pay | Admitting: Physical Medicine & Rehabilitation

## 2021-02-11 ENCOUNTER — Ambulatory Visit: Payer: 59

## 2021-02-14 ENCOUNTER — Other Ambulatory Visit: Payer: Self-pay

## 2021-02-14 ENCOUNTER — Encounter
Payer: Managed Care, Other (non HMO) | Attending: Physical Medicine & Rehabilitation | Admitting: Physical Medicine and Rehabilitation

## 2021-02-14 ENCOUNTER — Encounter: Payer: Self-pay | Admitting: Physical Medicine and Rehabilitation

## 2021-02-14 VITALS — BP 139/92 | HR 55 | Temp 98.0°F | Ht 73.0 in | Wt 312.0 lb

## 2021-02-14 DIAGNOSIS — Z789 Other specified health status: Secondary | ICD-10-CM | POA: Diagnosis not present

## 2021-02-14 DIAGNOSIS — Z7409 Other reduced mobility: Secondary | ICD-10-CM | POA: Insufficient documentation

## 2021-02-14 DIAGNOSIS — I6300 Cerebral infarction due to thrombosis of unspecified precerebral artery: Secondary | ICD-10-CM | POA: Insufficient documentation

## 2021-02-14 NOTE — Progress Notes (Signed)
Subjective:    Patient ID: Justin Mason, male    DOB: [birth year], 40 y.o.   MRN: [id number]  HPI Mr. Hemann is a 40 year old man who presents for follow-up of CVA.  -he continues to have impaired mobility and requires a handicap placard -his mother says that his therapists are still coming to his home but that his insurance has stopped paying for his therapy so they will likely stop coming soon -she needs a tub bench, renewal of his gel mattress, and a seated rollator.  -he asks about returning to driving.    Pain Inventory Average Pain 0 Pain Right Now 0 My pain is  Spasms in the right leg.  LOCATION OF PAIN  No pain  BOWEL Number of stools per week: 7 Oral laxative use No  Type of laxative NO Enema or suppository use No  History of colostomy No  Incontinent No   BLADDER Normal In and out cath, frequency n/a Able to self cath No  Bladder incontinence No  Frequent urination No  Leakage with coughing No  Difficulty starting stream No  Incomplete bladder emptying No    Mobility walk with assistance use a walker ability to climb steps?  yes do you drive?  yes use a wheelchair transfers alone Do you have any goals in this area?  yes  Function employed # of hrs/week Medical leave from [employer]  I need assistance with the following:  dressing, bathing, toileting, meal prep, household duties, and shopping Do you have any goals in this area?  yes  Neuro/Psych weakness trouble walking spasms  Prior Studies Any changes since last visit?  no  Physicians involved in your care Any changes since last visit?  yes. Dr. Peter Swaziland - [facility] Care   Family History  Problem Relation Age of Onset   Anesthesia problems Sister    Prostate cancer Other    Colon cancer Other    Social History   Socioeconomic History   Marital status: Significant Other    Spouse name: Not on file   Number of children: Not on file   Years of education: Not on file   Highest  education level: Not on file  Occupational History   Not on file  Tobacco Use   Smoking status: Never   Smokeless tobacco: Current    Types: Snuff  Vaping Use   Vaping Use: Never used  Substance and Sexual Activity   Alcohol use: Yes    Alcohol/week: 7.0 standard drinks    Types: 7 Shots of liquor per week   Drug use: No   Sexual activity: Yes  Other Topics Concern   Not on file  Social History Narrative   Lives with mom and girlfriend   Right Handed   Drinks caffeinated tea   Social Determinants of Health   Financial Resource Strain: Not on file  Food Insecurity: Not on file  Transportation Needs: Not on file  Physical Activity: Not on file  Stress: Not on file  Social Connections: Not on file   Past Surgical History:  Procedure Laterality Date   INCISION AND DRAINAGE FOOT  1991-1992   STEPPED ON NAIL / INFECTED WOUND   IR ANGIO INTRA EXTRACRAN SEL COM CAROTID INNOMINATE BILAT MOD SED  07/24/2020   IR ANGIO VERTEBRAL SEL SUBCLAVIAN INNOMINATE UNI L MOD SED  07/24/2020   IR ANGIO VERTEBRAL SEL VERTEBRAL UNI R MOD SED  07/24/2020   RADIOLOGY WITH ANESTHESIA N/A 07/24/2020   Procedure:  IR WITH ANESTHESIA - CODE STROKE;  Surgeon: Radiologist, Medication, MD;  Location: [facility] OR;  Service: Radiology;  Laterality: N/A;   SHOULDER SURGERY     September 08, 2011   Past Medical History:  Diagnosis Date   AKI (acute kidney injury) (HCC)    Arthritis    Cardiac murmur 05/24/2020   Cellulitis 01/03/2015   Cellulitis of right leg    Cellulitis of right lower extremity 01/03/2015   Essential hypertension    GERD (gastroesophageal reflux disease)    High cholesterol    Hyperlipemia    Hypertension    Morbid obesity (HCC) 05/24/2020   OSA (obstructive sleep apnea)    Rotator cuff tear, right 09/08/2011   Sepsis (HCC) 01/03/2015   Stroke (HCC)    Type 2 diabetes mellitus with hyperglycemia (HCC)    BP (!) 148/96   Pulse (!) 55   Temp 98 F (36.7 C)   Ht 6\' 1"  (1.854 m)   Wt (!) 312  lb (141.5 kg)   SpO2 98%   BMI 41.16 kg/m   Opioid Risk Score:   Fall Risk Score:  `1  Depression screen PHQ 2/9  Depression screen [facility] 2/9 02/14/2021 10/10/2020  Decreased Interest 0 3  Down, Depressed, Hopeless 0 1  PHQ - 2 Score 0 4  Altered sleeping - 0  Tired, decreased energy - 2  Change in appetite - 0  Feeling bad or failure about yourself  - 0  Trouble concentrating - 0  Moving slowly or fidgety/restless - 0  Suicidal thoughts - 0  PHQ-9 Score - 6    Review of Systems  Musculoskeletal:  Positive for gait problem.       Right leg spasms  Neurological:  Positive for weakness.  All other systems reviewed and are negative.     Objective:   Physical Exam Gen: no distress, normal appearing HEENT: oral mucosa pink and moist, NCAT Cardio: Reg rate Chest: normal effort, normal rate of breathing Abd: soft, non-distended Ext: no edema Psych: pleasant, normal affect Skin: intact Neuro: Alert and oriented x3. Musculoskeletal: 5/5 strength on left side, RUE 3/5, RLE 4/5    Assessment & Plan:  Mr. Ayala is a 40 year old man who presents for follow-up of CVA  1) Impaired mobility and ADLs secondary to CVA -recommended that he and his mother ask his home therapists to provide a HEP so he can continue to practice their exercises every day -provided a handicap placard  RETURN TO DRIVING PLAN:  WITH THE SUPERVISION OF A LICENSED DRIVER, PLEASE DRIVE IN AN EMPTY PARKING LOT FOR AT LEAST 2-3 TRIALS TO TEST REACTION TIME, VISION, USE OF EQUIPMENT IN CAR, ETC.  IF SUCCESSFUL WITH THE PARKING LOT DRIVING, PROCEED TO SUPERVISED DRIVING TRIALS IN YOUR NEIGHBORHOOD STREETS AT LOW TRAFFIC TIMES TO TEST OBSERVATION TO TRAFFIC SIGNALS, REACTION TIME, ETC. PLEASE ATTEMPT AT LEAST 2-3 TRIALS IN YOUR NEIGHBORHOOD.  IF NEIGHBORHOOD DRIVING IS SUCCESSFUL, YOU MAY PROCEED TO DRIVING IN BUSIER AREAS IN YOUR COMMUNITY WITH SUPERVISION OF A LICENSED DRIVER. PLEASE ATTEMPT AT LEAST 4-5  TRIALS.  IF COMMUNITY DRIVING IS SUCCESSFUL, YOU MAY PROCEED TO DRIVING ALONE, DURING THE DAY TIME, IN NON-PEAK TRAFFIC TIMES.   -Provided script for tub bench, rollator, and renewal of his gel mattress for prevention of pressure injuries to [employer].

## 2021-02-14 NOTE — Patient Instructions (Signed)
RETURN TO DRIVING PLAN:  WITH THE SUPERVISION OF A LICENSED DRIVER, PLEASE DRIVE IN AN EMPTY PARKING LOT FOR AT LEAST 2-3 TRIALS TO TEST REACTION TIME, VISION, USE OF EQUIPMENT IN CAR, ETC.  IF SUCCESSFUL WITH THE PARKING LOT DRIVING, PROCEED TO SUPERVISED DRIVING TRIALS IN YOUR NEIGHBORHOOD STREETS AT LOW TRAFFIC TIMES TO TEST OBSERVATION TO TRAFFIC SIGNALS, REACTION TIME, ETC. PLEASE ATTEMPT AT LEAST 2-3 TRIALS IN YOUR NEIGHBORHOOD.  IF NEIGHBORHOOD DRIVING IS SUCCESSFUL, YOU MAY PROCEED TO DRIVING IN BUSIER AREAS IN YOUR COMMUNITY WITH SUPERVISION OF A LICENSED DRIVER. PLEASE ATTEMPT AT LEAST 4-5 TRIALS.  IF COMMUNITY DRIVING IS SUCCESSFUL, YOU MAY PROCEED TO DRIVING ALONE, DURING THE DAY TIME.

## 2021-02-19 ENCOUNTER — Telehealth: Payer: Self-pay | Admitting: Cardiology

## 2021-02-19 ENCOUNTER — Other Ambulatory Visit: Payer: Self-pay | Admitting: Physical Medicine & Rehabilitation

## 2021-02-19 MED ORDER — CHLORTHALIDONE 25 MG PO TABS: 25.0000 mg | ORAL_TABLET | Freq: Every day | ORAL | 6 refills | Status: DC

## 2021-02-19 NOTE — Telephone Encounter (Signed)
Would recommend adding chlorthalidone 25 mg daily. Will need BMET when he sees pharmacy  Willie Loy Swaziland MD, [facility]

## 2021-02-19 NOTE — Telephone Encounter (Signed)
Pt c/o BP issue: STAT if pt c/o blurred vision, one-sided weakness or slurred speech  1. What are your last 5 BP readings?  02/06/21:   Morning- 162/98 HR 59  Evening- 160/94 HR 54 02/07/21:  Morning- 161/82 HR 58  Evening- 164/95 HR 60 02/08/21:   Morning- 173/100 HR 55  Evening- 182/110 HR 68 02/09/21:   Morning- 162/99 HR 54  Evening- 165/101 HR 64 02/10/21:   Morning- 159/93 HR 57  Evening- 164/92 HR 65 02/11/21:   Morning- 161/93 HR 53  Evening- 168/93 HR 63 02/12/21:   Morning- 173/114 HR 55  Evening- 163/81 HR 66 02/13/21:   Morning- 164/105 HR 54  Evening- 151/83 HR 70 02/14/21:   Morning- 159/113 HR 54  Evening- 152/91 HR 68 02/15/21:   Morning- 163/92 HR 56  Evening- 159/98 HR 72 02/16/21:   Morning- 174/101 HR 71  Evening- 156/82 HR 83 02/17/21:   Morning- 156/99 HR 52   Evening- 147/83 HR 59 02/18/21:   Morning- 162/92 HR 55  Evening- 163/90 HR 58 02/19/21:   Morning- 158/91 HR 52   2. Are you having any other symptoms (ex. Dizziness, headache, blurred vision, passed out)? No.  3. What is your BP issue? Mother is concerned because the patient's HR is low but his BP is kind of high.  The patient had a stroke 07/24/20 and has been cared for by his Mother since he came home from the hospital. The mother states he sits elevated in his hospital bed most of the day. He does not sit in chairs like recliners much because he has a hard time getting out of low chairs. He tested positive for COVID at home yesterday (02/18/21) but has not had not had much more than a cough, congestion and runny nose.  The mother had to reschedule the Pharmacist visit for today. She wanted to document the BP readings so Dr. Swaziland could go over them

## 2021-02-19 NOTE — Telephone Encounter (Signed)
Patient had to miss appointment with pharmacist for HTN yesterday so his mother called in with a list of his recent readings for advisement until he can be seen. Patient was rescheduled for 8/31

## 2021-02-19 NOTE — Telephone Encounter (Signed)
Spoke to patient's mother Dr.Jordan advised to start Chlorthalidone 25 mg daily.Bmet when he sees pharmacy 8/31.Advised to continue to monitor B/P and bring readings to appointment.

## 2021-02-20 ENCOUNTER — Ambulatory Visit: Payer: 59

## 2021-03-05 ENCOUNTER — Ambulatory Visit (INDEPENDENT_AMBULATORY_CARE_PROVIDER_SITE_OTHER): Payer: Managed Care, Other (non HMO) | Admitting: Pharmacist Clinician (PhC)/ Clinical Pharmacy Specialist

## 2021-03-05 ENCOUNTER — Other Ambulatory Visit: Payer: Self-pay

## 2021-03-05 VITALS — BP 124/80 | HR 69 | Resp 15 | Ht 74.0 in | Wt 312.0 lb

## 2021-03-05 DIAGNOSIS — I10 Essential (primary) hypertension: Secondary | ICD-10-CM

## 2021-03-05 MED ORDER — HYDRALAZINE HCL 25 MG PO TABS: 25.0000 mg | ORAL_TABLET | Freq: Two times a day (BID) | ORAL | 6 refills | Status: DC

## 2021-03-05 NOTE — Patient Instructions (Signed)
   Go to the lab today to check kidney function.  Check your blood pressure at home daily and keep record of the readings.  If you notice the average BP each week continues to be > 140 or > 90, please call Kristofer Schaffert/Chris at [phone]  Take your BP meds as follows:  Decrease clonidine to 0.1 mg once daily in the mornings for 1 week, then take 0.1 mg every other day for another week, then stop.    Start hydralazine 25 mg once daily in the evenings for the first week, then increase to 25 mg twice daily after that.   Continue with chlorthalidone, irbesartan and metoprolol.   Bring all of your meds, your BP cuff and your record of home blood pressures to your next appointment.  Exercise as you're able, try to walk approximately 30 minutes per day.  Keep salt intake to a minimum, especially watch canned and prepared boxed foods.  Eat more fresh fruits and vegetables and fewer canned items.  Avoid eating in fast food restaurants.    HOW TO TAKE YOUR BLOOD PRESSURE: Rest 5 minutes before taking your blood pressure.  Don't smoke or drink caffeinated beverages for at least 30 minutes before. Take your blood pressure before (not after) you eat. Sit comfortably with your back supported and both feet on the floor (don't cross your legs). Elevate your arm to heart level on a table or a desk. Use the proper sized cuff. It should fit smoothly and snugly around your bare upper arm. There should be enough room to slip a fingertip under the cuff. The bottom edge of the cuff should be 1 inch above the crease of the elbow. Ideally, take 3 measurements at one sitting and record the average.

## 2021-03-05 NOTE — Assessment & Plan Note (Signed)
Patient with resistant hypertension, doing better in office today, but home readings still elevated.  Will have him discontinue clonidine and instead switch to hydralazine, which has fewer side effects.  He will take the clonidine only once daily for 1 week then every other day for 1 week, then discontinue.  He should start hydralazine 25 mg once daily for the first week (at opposite time of day of clonidine), then increase to twice daily after that.  He will be seeing his PCP in about a week, and I asked that he take home BP cuff to that office for validation.  If home meter is accurate and home readings continue to be > 140 or > 90 after 2-3 weeks on new regimen, they can call the office for [place] to titrate hydralazine up.  He is scheduled to see Dr. [place] in 6 weeks, and we can see him after that as needed.

## 2021-03-05 NOTE — Progress Notes (Signed)
03/05/2021 ROMELO Mason February 01, 1981 [id number]   HPI:  Justin Mason is a 40 y.o. male patient of Dr Justin Mason, with a PMH below who presents today for hypertension clinic evaluation.  He has had problems with hypertension for some time, with poor control.   He was followed by Dr. Tomie Mason for some time, but has switched to seeing Dr. [place], as that is his mother's cardiologist.   In January of this year he was diagnosed with Covid, then about 10 days later was hospitalized with CVA.  CT showed left side infarcts and patient has continued issues with right side weakness and speech deficits.    Today he is in the office with his mother and sister.  Much of his history comes from them.  He reports compliance with taking meds and no issues with chest pain, SOB, edema or dizziness.    Past Medical History: hyperlipidemia 4/22 LDL 46 on rosuvastatin 10 mg daily  DM2 4/22 A1c 5.4 - on Farxiga 10, metformin 1 gm  CVA 1/22 - after Covid diagnosis  Morbid obesity BMI at 6/22 appt 43.3     Blood Pressure Goal:  130/80  Current Medications: chlorthalidone 25 mg qd, clonidine 0.1 mg bid, irbesartan 300 mg qd, metoprolol tart 100 mg bid  Family ZD:GLOVFI had  mi at 8; mgf had multiple MI; father doing well  pgm with stroke; 2 siblings, no known BP issues, brother with DM  Social Hx: no tobacco, social alcohol (beer); Mt. Dew or tea (unsweet) daily  Diet: mother does cooking - mostly from scratch;  meats and vegetables (fresh and home canned)  Exercise: did PT until about 1 week ago, insurance stopped paying, looking to get outpatient PT  Home BP readings: has arm cuff, about 40 year old  AM 28 readings average 155/92  HR 56  PM 26 readings average 156/85  HR 65  Intolerances: nkda  Labs: 4/22: Na 140, K 3.9, Glu 93, BUN 11, SCr 0.67, LDL 46   Wt Readings from Last 3 Encounters:  03/05/21 (!) 312 lb (141.5 kg)  02/14/21 (!) 312 lb (141.5 kg)  12/05/20 (!) 328 lb (148.8 kg)   BP  Readings from Last 3 Encounters:  03/05/21 124/80  02/14/21 (!) 139/92  12/05/20 (!) 152/88   Pulse Readings from Last 3 Encounters:  03/05/21 69  02/14/21 (!) 55  12/05/20 61    Current Outpatient Medications  Medication Sig Dispense Refill   acetaminophen (TYLENOL) 325 MG tablet Take 1-2 tablets (325-650 mg total) by mouth every 4 (four) hours as needed for mild pain.     allopurinol (ZYLOPRIM) 100 MG tablet Take 100 mg by mouth as needed (gout).     Ascorbic Acid (VITAMIN C PO) Take 1 tablet by mouth every evening.     aspirin 81 MG EC tablet TAKE 1 TABLET (81 MG TOTAL) BY MOUTH DAILY. SWALLOW WHOLE. 100 tablet 11   baclofen (LIORESAL) 10 MG tablet TAKE 1 TABLET BY MOUTH THREE TIMES DAILY 90 each 2   chlorthalidone (HYGROTON) 25 MG tablet Take 1 tablet (25 mg total) by mouth daily. 30 tablet 6   Cholecalciferol (VITAMIN D-3) 25 MCG (1000 UT) CAPS Take 1,000 Units by mouth every evening.     colchicine 0.6 MG tablet Take 1 tablet (0.6 mg total) by mouth daily. 7 tablet 0   collagenase (SANTYL) ointment APPLY TOPICALLY DAILY. 30 g 0   dapagliflozin propanediol (FARXIGA) 10 MG TABS tablet Take 1 tablet (  10 mg total) by mouth daily. 30 tablet 0   diclofenac Sodium (VOLTAREN) 1 % GEL Apply 2 g topically 4 (four) times daily. 350 g 0   gabapentin (NEURONTIN) 300 MG capsule TAKE 1 CAPSULE BY MOUTH THREE TIMES DAILY 90 capsule 1   glimepiride (AMARYL) 2 MG tablet Take by mouth.     hydrALAZINE (APRESOLINE) 25 MG tablet Take 1 tablet (25 mg total) by mouth 2 (two) times daily. 60 tablet 6   irbesartan (AVAPRO) 300 MG tablet Take 1 tablet (300 mg total) by mouth daily. 90 tablet 3   metFORMIN (GLUCOPHAGE) 1000 MG tablet TAKE 1 TABLET (1,000 MG TOTAL) BY MOUTH 2 (TWO) TIMES DAILY. 60 tablet 0   metFORMIN (GLUCOPHAGE-XR) 500 MG 24 hr tablet Take 1,000 mg by mouth 2 (two) times daily.     Multiple Vitamin (MULTIVITAMIN WITH MINERALS) TABS tablet Take 1 tablet by mouth daily. 30 tablet 0    pantoprazole (PROTONIX) 40 MG tablet TAKE 1 TABLET (40 MG TOTAL) BY MOUTH DAILY. 30 tablet 0   rosuvastatin (CRESTOR) 10 MG tablet Take 1 tablet (10 mg total) by mouth daily. 30 tablet 0   sertraline (ZOLOFT) 25 MG tablet TAKE 1 TABLET (25 MG TOTAL) BY MOUTH DAILY. 30 tablet 0   zinc gluconate 50 MG tablet Take 50 mg by mouth daily.     No current facility-administered medications for this visit.    No Known Allergies  Past Medical History:  Diagnosis Date   AKI (acute kidney injury) (HCC)    Arthritis    Cardiac murmur 05/24/2020   Cellulitis 01/03/2015   Cellulitis of right leg    Cellulitis of right lower extremity 01/03/2015   Essential hypertension    GERD (gastroesophageal reflux disease)    High cholesterol    Hyperlipemia    Hypertension    Morbid obesity (HCC) 05/24/2020   OSA (obstructive sleep apnea)    Rotator cuff tear, right 09/08/2011   Sepsis (HCC) 01/03/2015   Stroke (HCC)    Type 2 diabetes mellitus with hyperglycemia (HCC)     Blood pressure 124/80, pulse 69, resp. rate 15, height 6\' 2"  (1.88 m), weight (!) 312 lb (141.5 kg), SpO2 97 %.  Essential hypertension Patient with resistant hypertension, doing better in office today, but home readings still elevated.  Will have him discontinue clonidine and instead switch to hydralazine, which has fewer side effects.  He will take the clonidine only once daily for 1 week then every other day for 1 week, then discontinue.  He should start hydralazine 25 mg once daily for the first week (at opposite time of day of clonidine), then increase to twice daily after that.  He will be seeing his PCP in about a week, and I asked that he take home BP cuff to that office for validation.  If home meter is accurate and home readings continue to be > 140 or > 90 after 2-3 weeks on new regimen, they can call the office for to titrate hydralazine up.  He is scheduled to see Dr. [place] in 6 weeks, and we can see him after that as needed.     Justin Mason PharmD CPP [facility] [address] [place], [place] [phone]

## 2021-03-06 LAB — BASIC METABOLIC PANEL
BUN/Creatinine Ratio: 14 (ref 9–20)
BUN: 15 mg/dL (ref 6–20)
CO2: 24 mmol/L (ref 20–29)
Calcium: 10.4 mg/dL — ABNORMAL HIGH (ref 8.7–10.2)
Chloride: 93 mmol/L — ABNORMAL LOW (ref 96–106)
Creatinine, Ser: 1.09 mg/dL (ref 0.76–1.27)
Glucose: 82 mg/dL (ref 65–99)
Potassium: 4.1 mmol/L (ref 3.5–5.2)
Sodium: 139 mmol/L (ref 134–144)
eGFR: 89 mL/min/{1.73_m2} (ref >59)

## 2021-03-17 ENCOUNTER — Telehealth: Payer: Self-pay

## 2021-03-17 NOTE — Telephone Encounter (Signed)
Pt has some concerns regarding med changes that you had made. Please give them a callback routing to kristin [email or web address] to address when she becomes available.

## 2021-03-17 NOTE — Telephone Encounter (Signed)
Spoke with patient mother.   She notes that he has been complaining of numbness in his jaw area for a day or two.  Had been weaning off the clonidine and was wondering if this is side effect of that process.  I can't see that it is a symptom of clonidine withdrawal or side effect of starting hydralazine.  BP readings at home have been WNL.  Advised that they continue once daily clonidine x 1 more week, then take 1 tablet every other day x 2 weeks.  Also recommended that patient reach out to neurology for follow up.

## 2021-04-11 NOTE — Progress Notes (Signed)
Cardiology Office Note:    Date:  04/14/2021   ID:  Justin Mason, DOB 1980/10/21, MRN [id number]  PCP:  Eber Jones, NP   [facility] Providers Cardiologist:  Greycen Felter [place], MD { Referring MD: Eber Jones, NP   Chief Complaint  Patient presents with   Hypertension     History of Present Illness:    Justin Mason is a 40 y.o. male with a hx of hypertension, hyperlipidemia, DM2, s/p CVA, obstructive sleep apnea and morbid obesity.  He has a history of difficult to control high blood pressure and has been followed by Dr. Tomie China.  Echocardiogram obtained on 07/04/2020 showed EF 60 to 65%, grade 1 DD.  His last visit with Dr. Tomie China was in January 2022, he was started on clonidine 0.1 mg twice a day.  He was diagnosed with COVID-19 infection in January and subsequently went to the hospital on 07/24/2020 with aphasia, right facial droop and right upper extremity weakness.  CT was concerning for left sided infarct.  CTA showed short segment occlusion in MCA, left ICA occlusion proximal left M1 and stenosis of PCAs.  He was outside the window for tPA.  Attempted angioplasty of MCA by interventional radiology was unsuccessful due to resistance, suspect occlusion to be chronic.  Repeat echocardiogram shows EF of 60 to 65%.  He was prescribed aspirin and Plavix with plan to continue for 3 months then transition to aspirin alone.  During inpatient rehab, HCTZ was discontinued Avapro was decreased to 150 mg daily to avoid hypotension.  Diabetic medication was reduced due to periods of hypoglycemic episodes.  Bilateral lower extremity Doppler was negative for DVT.  Patient was treated with 7 days of antibiotic due to E. coli UTI.  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  He also had a gout flare during recovery as well.  Patient was eventually discharged following inpatient rehab in early March 2022.   Plavix was discontinued on 5/16 after seeing Dr. Pearlean Brownie for follow-up.  He was seen in our hypertension clinic. Was having side effects on clonidine- this was weaned off. Was placed on hydralazine 25 mg bid.   On  irbesartan 300 mg daily.  On metoprolol 100 mg bid and HCTZ. Tolerating medication well. He is working hard. Brings long diary of BP readings which have been running high. Reports BP cuff was correlated with primary care. No chest pain or dyspnea.    Past Medical History:  Diagnosis Date   AKI (acute kidney injury) (HCC)    Arthritis    Cardiac murmur 05/24/2020   Cellulitis 01/03/2015   Cellulitis of right leg    Cellulitis of right lower extremity 01/03/2015   Essential hypertension    GERD (gastroesophageal reflux disease)    High cholesterol    Hyperlipemia    Hypertension    Morbid obesity (HCC) 05/24/2020   OSA (obstructive sleep apnea)    Rotator cuff tear, right 09/08/2011   Sepsis (HCC) 01/03/2015   Stroke (HCC)    Type 2 diabetes mellitus with hyperglycemia (HCC)     Past Surgical History:  Procedure Laterality Date   INCISION AND DRAINAGE FOOT  1991-1992   STEPPED ON NAIL / INFECTED WOUND   IR ANGIO INTRA EXTRACRAN SEL COM CAROTID INNOMINATE BILAT MOD SED  07/24/2020   IR ANGIO VERTEBRAL SEL SUBCLAVIAN INNOMINATE UNI L MOD SED  07/24/2020  IR ANGIO VERTEBRAL SEL VERTEBRAL UNI R MOD SED  07/24/2020   RADIOLOGY WITH ANESTHESIA N/A 07/24/2020   Procedure: IR WITH ANESTHESIA - CODE STROKE;  Surgeon: Radiologist, Medication, MD;  Location: [facility] OR;  Service: Radiology;  Laterality: N/A;   SHOULDER SURGERY     September 08, 2011    Current Medications: Current Meds  Medication Sig   acetaminophen (TYLENOL) 325 MG tablet Take 1-2 tablets (325-650 mg total) by mouth every 4 (four) hours as needed for mild pain.   allopurinol (ZYLOPRIM) 100 MG tablet Take 100 mg by mouth as needed (gout).   Ascorbic Acid (VITAMIN C PO) Take 1 tablet by mouth every evening.   aspirin 81 MG EC  tablet TAKE 1 TABLET (81 MG TOTAL) BY MOUTH DAILY. SWALLOW WHOLE.   baclofen (LIORESAL) 10 MG tablet TAKE 1 TABLET BY MOUTH THREE TIMES DAILY   chlorthalidone (HYGROTON) 25 MG tablet Take 1 tablet (25 mg total) by mouth daily.   Cholecalciferol (VITAMIN D-3) 25 MCG (1000 UT) CAPS Take 1,000 Units by mouth every evening.   colchicine 0.6 MG tablet Take 1 tablet (0.6 mg total) by mouth daily.   collagenase (SANTYL) ointment APPLY TOPICALLY DAILY.   dapagliflozin propanediol (FARXIGA) 10 MG TABS tablet Take 1 tablet (10 mg total) by mouth daily.   diclofenac Sodium (VOLTAREN) 1 % GEL Apply 2 g topically 4 (four) times daily.   gabapentin (NEURONTIN) 300 MG capsule TAKE 1 CAPSULE BY MOUTH THREE TIMES DAILY   glimepiride (AMARYL) 2 MG tablet Take by mouth.   hydrALAZINE (APRESOLINE) 50 MG tablet Take 1 tablet (50 mg total) by mouth 2 (two) times daily.   irbesartan (AVAPRO) 300 MG tablet Take 1 tablet (300 mg total) by mouth daily.   metFORMIN (GLUCOPHAGE) 1000 MG tablet TAKE 1 TABLET (1,000 MG TOTAL) BY MOUTH 2 (TWO) TIMES DAILY.   metFORMIN (GLUCOPHAGE-XR) 500 MG 24 hr tablet Take 1,000 mg by mouth 2 (two) times daily.   metoprolol tartrate (LOPRESSOR) 100 MG tablet Take 1 tablet (100 mg total) by mouth 2 (two) times daily.   Multiple Vitamin (MULTIVITAMIN WITH MINERALS) TABS tablet Take 1 tablet by mouth daily.   pantoprazole (PROTONIX) 40 MG tablet TAKE 1 TABLET (40 MG TOTAL) BY MOUTH DAILY.   rosuvastatin (CRESTOR) 10 MG tablet Take 1 tablet (10 mg total) by mouth daily.   sertraline (ZOLOFT) 25 MG tablet TAKE 1 TABLET (25 MG TOTAL) BY MOUTH DAILY.   zinc gluconate 50 MG tablet Take 50 mg by mouth daily.   [DISCONTINUED] hydrALAZINE (APRESOLINE) 25 MG tablet Take 1 tablet (25 mg total) by mouth 2 (two) times daily.     Allergies:   Patient has no known allergies.   Social History   Socioeconomic History   Marital status: Significant Other    Spouse name: Not on file   Number of  children: Not on file   Years of education: Not on file   Highest education level: Not on file  Occupational History   Not on file  Tobacco Use   Smoking status: Never   Smokeless tobacco: Former    Types: Snuff  Vaping Use   Vaping Use: Never used  Substance and Sexual Activity   Alcohol use: Yes    Alcohol/week: 7.0 standard drinks    Types: 7 Shots of liquor per week   Drug use: No   Sexual activity: Yes  Other Topics Concern   Not on file  Social History Narrative   Lives  with mom and girlfriend   Right Handed   Drinks caffeinated tea   Social Determinants of Health   Financial Resource Strain: Not on file  Food Insecurity: Not on file  Transportation Needs: Not on file  Physical Activity: Not on file  Stress: Not on file  Social Connections: Not on file     Family History: The patient's family history includes Anesthesia problems in his sister; Colon cancer in an other family member; Prostate cancer in an other family member.  ROS:   Please see the history of present illness.     All other systems reviewed and are negative.  EKGs/Labs/Other Studies Reviewed:    The following studies were reviewed today:  Echo 07/25/2020 1. Left ventricular ejection fraction, by estimation, is 60 to 65%. The  left ventricle has normal function. The left ventricle has no regional  wall motion abnormalities. There is moderate concentric left ventricular  hypertrophy. Left ventricular  diastolic parameters are indeterminate.   2. Right ventricular systolic function is normal. The right ventricular  size is normal.   3. The mitral valve is normal in structure. No evidence of mitral valve  regurgitation. No evidence of mitral stenosis.   4. The aortic valve is tricuspid. Aortic valve regurgitation is not  visualized. No aortic stenosis is present.   5. The inferior vena cava is dilated in size with >50% respiratory  variability, suggesting right atrial pressure of 8 mmHg.    Comparison(s): No significant change from prior study.   EKG:  EKG is not ordered today.    Recent Labs: 08/08/2020: Magnesium 1.9 08/16/2020: ALT 31 09/02/2020: Hemoglobin 12.0; Platelets 259 03/05/2021: BUN 15; Creatinine, Ser 1.09; Potassium 4.1; Sodium 139  Recent Lipid Panel    Component Value Date/Time   CHOL 93 07/25/2020 0515   TRIG 190 (H) 07/25/2020 0515   HDL 19 (L) 07/25/2020 0515   CHOLHDL 4.9 07/25/2020 0515   VLDL 38 07/25/2020 0515   LDLCALC 36 07/25/2020 0515     Risk Assessment/Calculations:       Physical Exam:    VS:  BP 132/84   Pulse 78   Ht 6\' 2"  (1.88 m)   Wt (!) 314 lb (142.4 kg)   SpO2 98%   BMI 40.32 kg/m     Wt Readings from Last 3 Encounters:  04/14/21 (!) 314 lb (142.4 kg)  03/05/21 (!) 312 lb (141.5 kg)  02/14/21 (!) 312 lb (141.5 kg)     GEN:  Well nourished, obese,in no acute distress HEENT: Normal NECK: No JVD; No carotid bruits LYMPHATICS: No lymphadenopathy CARDIAC: RRR, no murmurs, rubs, gallops RESPIRATORY:  Clear to auscultation without rales, wheezing or rhonchi  ABDOMEN: Soft, non-tender, non-distended MUSCULOSKELETAL:  No edema; No deformity  SKIN: Warm and dry NEUROLOGIC:  Alert and oriented x 3 PSYCHIATRIC:  Normal affect   ASSESSMENT:    1. Essential hypertension   2. Hyperlipidemia LDL goal <70   3. Morbid obesity (HCC)   4. H/O: CVA (cerebrovascular accident)     PLAN:    In order of problems listed above:  History of CVA:  Patient is still have right-sided weakness.  Doing  physical therapy at home  Hypertension: Blood pressure elevated. Will increase hydralazine to 50 mg bid. Also on irbesartan 300 mg daily, metoprolol tartrate 100 mg bid and HCTZ 25 mg daily. Low sodium diet. Weight loss. Follow up in 3 months. Has appointment with PCP and Neuro in November.  Hyperlipidemia: Continue Crestor  DM2:  Managed by primary care provider  Morbid obesity: encourage weight loss.         Medication  Adjustments/Labs and Tests Ordered: Current medicines are reviewed at length with the patient today.  Concerns regarding medicines are outlined above.  No orders of the defined types were placed in this encounter.  Meds ordered this encounter  Medications   metoprolol tartrate (LOPRESSOR) 100 MG tablet    Sig: Take 1 tablet (100 mg total) by mouth 2 (two) times daily.    Dispense:  180 tablet    Refill:  3   hydrALAZINE (APRESOLINE) 50 MG tablet    Sig: Take 1 tablet (50 mg total) by mouth 2 (two) times daily.    Dispense:  180 tablet    Refill:  3     Patient Instructions  Increase hydralazine to 50 mg bid.  Continue your other therapy  Follow up in 6 months.   Signed, Tela Kotecki [place], MD  04/14/2021 3:36 PM     [facility]

## 2021-04-14 ENCOUNTER — Encounter: Payer: Self-pay | Admitting: Cardiology

## 2021-04-14 ENCOUNTER — Other Ambulatory Visit: Payer: Self-pay

## 2021-04-14 ENCOUNTER — Ambulatory Visit (INDEPENDENT_AMBULATORY_CARE_PROVIDER_SITE_OTHER): Payer: Managed Care, Other (non HMO) | Admitting: Cardiology

## 2021-04-14 VITALS — BP 132/84 | HR 78 | Ht 74.0 in | Wt 314.0 lb

## 2021-04-14 DIAGNOSIS — Z8673 Personal history of transient ischemic attack (TIA), and cerebral infarction without residual deficits: Secondary | ICD-10-CM | POA: Diagnosis not present

## 2021-04-14 DIAGNOSIS — I10 Essential (primary) hypertension: Secondary | ICD-10-CM

## 2021-04-14 DIAGNOSIS — E785 Hyperlipidemia, unspecified: Secondary | ICD-10-CM

## 2021-04-14 MED ORDER — METOPROLOL TARTRATE 100 MG PO TABS: 100.0000 mg | ORAL_TABLET | Freq: Two times a day (BID) | ORAL | 3 refills | Status: DC

## 2021-04-14 MED ORDER — HYDRALAZINE HCL 50 MG PO TABS: 50.0000 mg | ORAL_TABLET | Freq: Two times a day (BID) | ORAL | 3 refills | Status: DC

## 2021-04-14 NOTE — Patient Instructions (Addendum)
Increase hydralazine to 50 mg bid.  Continue your other therapy  Follow up in 3 months. Tuesday, 07/22/2021 at 2:15 pm with Azalee Course, PA

## 2021-05-20 ENCOUNTER — Ambulatory Visit (INDEPENDENT_AMBULATORY_CARE_PROVIDER_SITE_OTHER): Payer: Managed Care, Other (non HMO) | Admitting: Adult Health

## 2021-05-20 ENCOUNTER — Encounter: Payer: Self-pay | Admitting: Adult Health

## 2021-05-20 VITALS — BP 106/65 | HR 73 | Ht 73.0 in | Wt 315.0 lb

## 2021-05-20 DIAGNOSIS — I63312 Cerebral infarction due to thrombosis of left middle cerebral artery: Secondary | ICD-10-CM

## 2021-05-20 DIAGNOSIS — I69320 Aphasia following cerebral infarction: Secondary | ICD-10-CM

## 2021-05-20 DIAGNOSIS — R569 Unspecified convulsions: Secondary | ICD-10-CM

## 2021-05-20 DIAGNOSIS — I69398 Other sequelae of cerebral infarction: Secondary | ICD-10-CM | POA: Diagnosis not present

## 2021-05-20 DIAGNOSIS — I69351 Hemiplegia and hemiparesis following cerebral infarction affecting right dominant side: Secondary | ICD-10-CM

## 2021-05-20 MED ORDER — LEVETIRACETAM 500 MG PO TABS: 500.0000 mg | ORAL_TABLET | Freq: Two times a day (BID) | ORAL | 5 refills | Status: DC

## 2021-05-20 NOTE — Patient Instructions (Signed)
Start Keppra 500mg  twice daily for seizure prevention  You will schedule EEG which looks for seizure activity  Please call neuro rehab Friday or early next week to schedule initial evaluations  Continue aspirin 81 mg daily  and Crestor  for secondary stroke prevention  Continue to follow up with PCP regarding cholesterol and blood pressure management  Maintain strict control of hypertension with blood pressure goal below 130/90 and cholesterol with LDL cholesterol (bad cholesterol) goal below 70 mg/dL.       Followup in the future with me in 4 months or call earlier if needed       Thank you for coming to see Sunday at [facility]. I hope we have been able to provide you high quality care today.  You may receive a patient satisfaction survey over the next few weeks. We would appreciate your feedback and comments so that we may continue to improve ourselves and the health of our patients.

## 2021-05-20 NOTE — Progress Notes (Signed)
I agree with the above plan 

## 2021-05-20 NOTE — Progress Notes (Signed)
[facility] [address] [place]. [place] [address] [phone]       OFFICE FOLLOW-UP NOTE  Mr. Justin Mason Date of Birth:  1981/02/12 Medical Record Number:  [id number]   Primary neurologist: Dr. Pearlean Brownie Reason for visit: Stroke follow-up   Chief Complaint  Patient presents with   Follow-up    RM 3 with mother [name] Pt mom states he has been doing much better but still having immobility in R hand.  Friday he knocked over his table in his sleep, and his walking ability decreased slightly since then and hasn't been "normal" since but cant explain what happened.      HPI:   Update 05/18/2021 JM: Returns for 49-month stroke follow-up accompanied by his mother, [name] who provides majority of history  Initially doing well since prior visit with improvement of right-sided weakness but still having decreased mobility of right hand. Residual speech and language impairment but also improving. He returned back to driving short distance and always accompanied by family member and recently drove a tractor to mow the grass. He completed HH therapy and ambulating with a cane without difficulty. Per mother, PCP has been working on getting him set up with outpatient therapies but has been having difficulty getting set up (unable to view via epic).  Mother reports upon awakening Friday morning, his bedside tray table was knocked over but unsure of how this happened and since then, he doesn't feel like his "normal" self feeling more out of it and some worsening of gait now requiring use of Rollator walker.  Upon further discussion, he apparently was incontinent of urine upon awakening as well as blood in his mouth.  He does report feeling some confusion and increased fatigue upon awakening that morning. Mother then reports initially after hospital discharge, he would have frequent episodes of "staring off" where mother would check his BP and BG which were stable. He would be more  fatigued and seeming "out of it" for 1-2 hours after. These occurred frequently the first 2 weeks after discharge but then subsided. Denies any prior hx of seizures and denies any additional witnessed seizures since Friday. Mother reports paternal grandmother had seizures after a stroke but no seizure prior and no other family history of seizures.  Compliant on aspirin and Crestor -denies side effects Blood pressure today 106/65  No further concerns at this time     History provided for reference purposes only Initial visit 11/18/2020 Dr. Pearlean Brownie: Mr. [name] is a 40 year old Caucasian male seen today for initial office follow-up visit following hospital admission for stroke in January 2022.  Is accompanied by his girlfriend and mother today.  History is obtained from them and review of electronic medical records and I personally reviewed pertinent available imaging films in PACS.  He has past medical history of morbid obesity, hypertension, hyperlipidemia, arthritis and diabetes.  He presented on 07/24/2020 with sudden onset of difficulty speaking that occurred at 10:30 in the morning on day of admission.  He was brought to the hospital and was noted to be aphasic and a code stroke was activated.  He underwent emergent CT angiogram which showed short segment occlusion of the proximal right M1 MCA with decreased caliber and opacification of the corresponding vascular tree.  Occlusion of the terminal left ICA and proximal left M1 segment with decreased caliber and contrast appropriate opacification.  Luminal irregularity of bilateral ACA and PCA vascular territories with moderate stenosis of the right P2/P3 segment.  CT perfusion suggestive  of large area of hypoperfusion without core infarct in the left and hence was taken for emergent mechanical thrombectomy but unfortunately at the time of the angiogram it was discovered that the blockage was likely chronic in this thrombectomy could not be performed.  He was  also found to be COVID-positive.  He was intubated and on mechanical ventilatory support for a few days in the ICU.  He was started on aspirin and Plavix for his MCA occlusion.  Chest x-ray showed patchy airspace disease right greater than left with suggestive multifocal infection from COVID.  Echocardiogram showed ejection fraction of 60 to 65% without definite cardiac source of embolism.  MRI scan showed acute left MCA infarct with occlusion of the left ICA terminus and left M1 segment with diminished opacification of distal left MCA branches.  LDL cholesterol was 36 mg percent and hemoglobin A1c was 6.2.  Patient was started on aspirin 81 mg and Plavix 75 mg daily for 3 months.  He was transferred to inpatient rehab where he stayed till 09/06/2020 and then was discharged home.  He is currently living at home.  He is getting home physical occupational and speech therapy.  His speech is improving and is able to speak many words and short sentences up to 9 words.  Complaints and is good.  Still has significant right hemiplegia is able to barely bend his right hand fingers and has no usable function in the right hand.  Is able to lift his right leg off the bed but is still not able to bear weight.  Even with physical therapist he can barely stand for a few moments.  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  Patient however can help with transfers.  He is  continent for his bladder and bowel function.  Is tolerating aspirin Plavix well without only minor bruising and no bleeding episodes.  His blood pressure is usually well controlled and today it is borderline in office at 148/91.  His had lipid profile checked by his primary care physician last month but I do not have the results.  He is on Crestor 10 mg which is tolerating well.     ROS:   14 system review of systems is positive for those listed in HPI and all other systems negative  PMH:  Past Medical History:   Diagnosis Date   AKI (acute kidney injury) (HCC)    Arthritis    Cardiac murmur 05/24/2020   Cellulitis 01/03/2015   Cellulitis of right leg    Cellulitis of right lower extremity 01/03/2015   Essential hypertension    GERD (gastroesophageal reflux disease)    High cholesterol    Hyperlipemia    Hypertension    Morbid obesity (HCC) 05/24/2020   OSA (obstructive sleep apnea)    Rotator cuff tear, right 09/08/2011   Sepsis (HCC) 01/03/2015   Stroke (HCC)    Type 2 diabetes mellitus with hyperglycemia (HCC)     Social History:  Social History   Socioeconomic History   Marital status: Significant Other    Spouse name: Not on file   Number of children: Not on file   Years of education: Not on file   Highest education level: Not on file  Occupational History   Not on file  Tobacco Use   Smoking status: Never   Smokeless tobacco: Former    Types: Snuff  Vaping Use   Vaping Use: Never used  Substance and  Sexual Activity   Alcohol use: Yes    Alcohol/week: 7.0 standard drinks    Types: 7 Shots of liquor per week   Drug use: No   Sexual activity: Yes  Other Topics Concern   Not on file  Social History Narrative   Lives with mom and girlfriend   Right Handed   Drinks caffeinated tea   Social Determinants of Health   Financial Resource Strain: Not on file  Food Insecurity: Not on file  Transportation Needs: Not on file  Physical Activity: Not on file  Stress: Not on file  Social Connections: Not on file  Intimate Partner Violence: Not on file    Medications:   Current Outpatient Medications on File Prior to Visit  Medication Sig Dispense Refill   acetaminophen (TYLENOL) 325 MG tablet Take 1-2 tablets (325-650 mg total) by mouth every 4 (four) hours as needed for mild pain.     allopurinol (ZYLOPRIM) 100 MG tablet Take 100 mg by mouth as needed (gout).     Ascorbic Acid (VITAMIN C PO) Take 1 tablet by mouth every evening.     aspirin 81 MG EC tablet TAKE 1 TABLET  (81 MG TOTAL) BY MOUTH DAILY. SWALLOW WHOLE. 100 tablet 11   baclofen (LIORESAL) 10 MG tablet TAKE 1 TABLET BY MOUTH THREE TIMES DAILY (Patient taking differently: 2 (two) times daily.) 90 each 2   chlorthalidone (HYGROTON) 25 MG tablet Take 1 tablet (25 mg total) by mouth daily. 30 tablet 6   Cholecalciferol (VITAMIN D-3) 25 MCG (1000 UT) CAPS Take 1,000 Units by mouth every evening.     colchicine 0.6 MG tablet Take 1 tablet (0.6 mg total) by mouth daily. (Patient taking differently: Take 0.6 mg by mouth as needed.) 7 tablet 0   collagenase (SANTYL) ointment APPLY TOPICALLY DAILY. 30 g 0   dapagliflozin propanediol (FARXIGA) 10 MG TABS tablet Take 1 tablet (10 mg total) by mouth daily. 30 tablet 0   diclofenac Sodium (VOLTAREN) 1 % GEL Apply 2 g topically 4 (four) times daily. 350 g 0   gabapentin (NEURONTIN) 300 MG capsule TAKE 1 CAPSULE BY MOUTH THREE TIMES DAILY (Patient taking differently: 2 (two) times daily.) 90 capsule 1   glimepiride (AMARYL) 2 MG tablet Take by mouth.     hydrALAZINE (APRESOLINE) 50 MG tablet Take 1 tablet (50 mg total) by mouth 2 (two) times daily. 180 tablet 3   irbesartan (AVAPRO) 300 MG tablet Take 1 tablet (300 mg total) by mouth daily. 90 tablet 3   metFORMIN (GLUCOPHAGE-XR) 500 MG 24 hr tablet Take 500 mg by mouth 2 (two) times daily.     metoprolol tartrate (LOPRESSOR) 100 MG tablet Take 1 tablet (100 mg total) by mouth 2 (two) times daily. 180 tablet 3   Multiple Vitamin (MULTIVITAMIN WITH MINERALS) TABS tablet Take 1 tablet by mouth daily. 30 tablet 0   pantoprazole (PROTONIX) 40 MG tablet TAKE 1 TABLET (40 MG TOTAL) BY MOUTH DAILY. 30 tablet 0   rosuvastatin (CRESTOR) 10 MG tablet Take 1 tablet (10 mg total) by mouth daily. 30 tablet 0   sertraline (ZOLOFT) 25 MG tablet TAKE 1 TABLET (25 MG TOTAL) BY MOUTH DAILY. 30 tablet 0   zinc gluconate 50 MG tablet Take 50 mg by mouth daily.     No current facility-administered medications on file prior to visit.     Allergies:  No Known Allergies  Physical Exam Today's Vitals   05/20/21 1546  BP: 106/65  Pulse:  73  Weight: (!) 315 lb (142.9 kg)  Height: 6\' 1"  (1.854 m)   Body mass index is 41.56 kg/m.   General: Obese very pleasant middle-age Caucasian male seated, in no evident distress Head: head normocephalic and atraumatic.  Neck: supple with no carotid or supraclavicular bruits Cardiovascular: regular rate and rhythm, no murmurs Musculoskeletal: no deformity Skin:  no rash/petichiae Vascular:  Normal pulses all extremities  Neurologic Exam Mental Status: Awake and fully alert. Oriented to place and time. Recent and remote memory intact. Attention span, concentration and fund of knowledge appropriate during visit.  Mild to moderate expressive aphasia with paraphasic errors but able to name objects without difficulty and follow simple step commands.  Mild dysarthria. Mood and affect appropriate.  Cranial Nerves: Pupils equal, briskly reactive to light. Extraocular movements full without nystagmus. Visual fields full to confrontation. Hearing intact. Facial sensation intact.  Face, tongue, palate moves normally and symmetrically.  Motor: Normal bulk and tone. Normal strength on the left side.   RUE: 4-4+/5 proximal and 3+-4-/5 distal with increased tone throughout RLE: 4+ to 5/5 Sensory.: intact to touch ,pinprick .position and vibratory sensation.  Coordination: Rapid alternating movements decreased right hand.  Finger-to-nose mild incoordination RUE and heel-to-shin performed accurately bilaterally gait and Station: Stands from seated position without difficulty.  Stance is normal.  Gait demonstrates slow, cautious decreased stride length and step height and mild imbalance with mild favoring of left leg due to chronic knee pain with use of Rollator walker.  Tandem walking heel toe not attempted Reflexes: 1+ and asymmetric and brisker on the right. Toes downgoing.          ASSESSMENT: 40 year old Caucasian male with left middle cerebral artery infarction and January 2022 due to left middle cerebral artery occlusion s/p attempted unsuccessful mechanical thrombectomy with also diffuse intracranial multi vessel atherosclerosis with significant residual aphasia and right hemiplegia.  Vascular risk factors of obesity, diabetes, hypertension and hyperlipidemia. Recent episode 11/11 of likely seizure while sleeping     PLAN:  1. 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) -Continue aspirin 81 mg daily  for secondary stroke prevention -Close PCP follow-up to maintain strict control of hypertension with blood pressure goal below 130/90, diabetes with hemoglobin A1c goal below 6.5% and lipids with LDL cholesterol goal below 70 mg/dL.   2. Seizure as late effect of cerebrovascular accident (CVA) (HCC) -High suspicion for seizure activity likely in setting of prior stroke occurring during sleep awakening with tongue biting and urinary incontinence (see HPI). Discussed possible "zoning out" or " staring" episodes possible focal type seizure -Discussed seizure triggers and importance of avoidance.  - complete EEG adult; Future - start levETIRAcetam (KEPPRA) 500 MG tablet; Take 1 tablet (500 mg total) by mouth 2 (two) times daily.  Dispense: 60 tablet; Refill: 5  3. Spastic hemiplegia of right dominant side as late effect of cerebral infarction (HCC) -slight set back likely in setting of recent seizure - Ambulatory referral to Physical Therapy - Ambulatory referral to Occupational Therapy  4. Aphasia as late effect of stroke - Ambulatory referral to Speech Therapy    Follow-up in 4 months or call earlier if needed    CC:  [facility] provider: Dr. Romie Jumper, Langley Adie, NP   I spent 42 minutes of face-to-face and non-face-to-face time with patient and mother.  This included previsit chart review, lab review, study review,  order entry, electronic health record documentation, patient and mother education and discussion regarding history  of prior stroke and residual deficits, possible recent seizure activity and further evaluation and treatment recommendations and answered all other questions to patient and mother satisfaction  Ihor Austin, AGNP-BC  [facility] [address] [place], [place] [address]  Phone [phone] Fax [phone] Note: This document was prepared with digital dictation and possible smart phrase technology. Any transcriptional errors that result from this process are unintentional.

## 2021-05-23 ENCOUNTER — Encounter
Payer: Managed Care, Other (non HMO) | Attending: Physical Medicine & Rehabilitation | Admitting: Physical Medicine and Rehabilitation

## 2021-05-23 ENCOUNTER — Other Ambulatory Visit: Payer: Self-pay

## 2021-05-23 ENCOUNTER — Encounter: Payer: Self-pay | Admitting: Physical Medicine and Rehabilitation

## 2021-05-23 VITALS — BP 107/70 | HR 65 | Temp 98.0°F | Ht 73.0 in | Wt 311.0 lb

## 2021-05-23 DIAGNOSIS — I6300 Cerebral infarction due to thrombosis of unspecified precerebral artery: Secondary | ICD-10-CM | POA: Diagnosis present

## 2021-05-23 DIAGNOSIS — Z6841 Body Mass Index (BMI) 40.0 and over, adult: Secondary | ICD-10-CM | POA: Diagnosis present

## 2021-05-23 NOTE — Progress Notes (Signed)
Subjective:    Patient ID: Justin Mason, male    DOB: 1980/11/16, 40 y.o.   MRN: [id number]  HPI Justin Mason is a 40 year old man who presents for follow-up of CVA.  -he continues to have impaired mobility and requires a handicap placard -his mother says that his therapists are still coming to his home but that his insurance has stopped paying for his therapy so they will likely stop coming soon -he was able to get the tub bend and gel mattress. He uses a seated rollator.  -he asks about returning to driving.  -he is no longer doing therapy, but is due to start outpatient therapy soon -[place]  -last few days he has not been walking as much as he was.  -denies pain -eight is 311 lbs, 41.03 BMI.    Pain Inventory Average Pain 0 Pain Right Now 0 My pain is  Spasms in the right leg.  LOCATION OF PAIN  No pain  BOWEL Number of stools per week: 3-5   BLADDER Normal    Mobility walk with assistance ability to climb steps?  yes do you drive?  yes use a wheelchair transfers alone Do you have any goals in this area?  yes  Function disabled: date disabled 07/24/2020 I need assistance with the following:  dressing, bathing, toileting, meal prep, household duties, and shopping Do you have any goals in this area?  yes  Neuro/Psych weakness trouble walking spasms  Prior Studies Any changes since last visit?  no  Physicians involved in your care Any changes since last visit?  yes. Dr. Peter [place] [facility]   Family History  Problem Relation Age of Onset   Anesthesia problems Sister    Prostate cancer Other    Colon cancer Other    Social History   Socioeconomic History   Marital status: Significant Other    Spouse name: Not on file   Number of children: Not on file   Years of education: Not on file   Highest education level: Not on file  Occupational History   Not on file  Tobacco Use   Smoking status: Never    Smokeless tobacco: Former    Types: Snuff  Vaping Use   Vaping Use: Never used  Substance and Sexual Activity   Alcohol use: Yes    Alcohol/week: 7.0 standard drinks    Types: 7 Shots of liquor per week   Drug use: No   Sexual activity: Yes  Other Topics Concern   Not on file  Social History Narrative   Lives with mom and girlfriend   Right Handed   Drinks caffeinated tea   Social Determinants of Health   Financial Resource Strain: Not on file  Food Insecurity: Not on file  Transportation Needs: Not on file  Physical Activity: Not on file  Stress: Not on file  Social Connections: Not on file   Past Surgical History:  Procedure Laterality Date   INCISION AND DRAINAGE FOOT  1991-1992   STEPPED ON NAIL / INFECTED WOUND   IR ANGIO INTRA EXTRACRAN SEL COM CAROTID INNOMINATE BILAT MOD SED  07/24/2020   IR ANGIO VERTEBRAL SEL SUBCLAVIAN INNOMINATE UNI L MOD SED  07/24/2020   IR ANGIO VERTEBRAL SEL VERTEBRAL UNI R MOD SED  07/24/2020   RADIOLOGY WITH ANESTHESIA N/A 07/24/2020   Procedure: IR WITH ANESTHESIA - CODE STROKE;  Surgeon: Radiologist, Medication, MD;  Location: [facility]  OR;  Service: Radiology;  Laterality: N/A;   SHOULDER SURGERY     September 08, 2011   Past Medical History:  Diagnosis Date   AKI (acute kidney injury) (HCC)    Arthritis    Cardiac murmur 05/24/2020   Cellulitis 01/03/2015   Cellulitis of right leg    Cellulitis of right lower extremity 01/03/2015   Essential hypertension    GERD (gastroesophageal reflux disease)    High cholesterol    Hyperlipemia    Hypertension    Morbid obesity (HCC) 05/24/2020   OSA (obstructive sleep apnea)    Rotator cuff tear, right 09/08/2011   Sepsis (HCC) 01/03/2015   Stroke (HCC)    Type 2 diabetes mellitus with hyperglycemia (HCC)    BP 107/70   Pulse 65   Temp 98 F (36.7 C)   Ht 6\' 1"  (1.854 m)   Wt (!) 311 lb (141.1 kg)   SpO2 98%   BMI 41.03 kg/m   Opioid Risk Score:   Fall Risk Score:  `1  Depression screen PHQ  2/9  Depression screen [facility] 2/9 05/23/2021 02/14/2021 10/10/2020  Decreased Interest 0 0 3  Down, Depressed, Hopeless 0 0 1  PHQ - 2 Score 0 0 4  Altered sleeping - - 0  Tired, decreased energy - - 2  Change in appetite - - 0  Feeling bad or failure about yourself  - - 0  Trouble concentrating - - 0  Moving slowly or fidgety/restless - - 0  Suicidal thoughts - - 0  PHQ-9 Score - - 6    Review of Systems  Musculoskeletal:  Positive for gait problem.       Right leg spasms  Neurological:  Positive for weakness.  All other systems reviewed and are negative.     Objective:   Physical Exam Gen: no distress, normal appearing HEENT: oral mucosa pink and moist, NCAT Cardio: Reg rate Chest: normal effort, normal rate of breathing Abd: soft, non-distended Ext: no edema Psych: pleasant, normal affect Skin: intact Neuro: Alert and oriented x3. Musculoskeletal: 5/5 strength on left side, RUE 3/5, RLE 4/5    Assessment & Plan:  Justin Mason is a 40 year old man who presents for follow-up of CVA  1) Impaired mobility and ADLs secondary to CVA -continue PT/OT -provided a handicap placard for 5 years -restarted driving.  -Provided script for tub bench, rollator, and renewal of his gel mattress for prevention of pressure injuries to [employer] [facility].     2) Obesity: -Educated that current weight is____ and current BMI is___ -Educated regarding health benefits of weight loss- for pain, general health, chronic disease prevention, immune health, mental health.  -Will monitor weight every visit.  -Consider Roobois tea daily.  -Discussed the benefits of intermittent fasting. -Discussed foods that can assist in weight loss: 1) leafy greens- high in fiber and nutrients 2) dark chocolate- improves metabolism (if prefer sweetened, best to sweeten with honey instead of sugar).  3) cruciferous vegetables- high in fiber and protein 4) full fat yogurt: high in healthy fat, protein, calcium, and  probiotics 5) apples- high in a variety of phytochemicals 6) nuts- high in fiber and protein that increase feelings of fullness 7) grapefruit: rich in nutrients, antioxidants, and fiber (not to be taken with anticoagulation) 8) beans- high in protein and fiber 9) salmon- has high quality protein and healthy fats 10) green tea- rich in polyphenols 11) eggs- rich in choline and vitamin D 12) tuna- high protein, boosts  metabolism 13) avocado- decreases visceral abdominal fat 14) chicken (pasture raised): high in protein and iron 15) blueberries- reduce abdominal fat and cholesterol 16) whole grains- decreases calories retained during digestion, speeds metabolism 17) chia seeds- curb appetite 18) chilies- increases fat metabolism  -Discussed supplements that can be used:  1) Metatrim 400mg  BID 30 minutes before breakfast and dinner  2) Sphaeranthus indicus and Garcinia mangostana (combinations of these and #1 can be found in capsicum and zychrome  3) green coffee bean extract 400mg  twice per day or Irvingia (african mango) 150 to 300mg  twice per day.

## 2021-05-23 NOTE — Patient Instructions (Signed)
Weight loss:  -Educated regarding health benefits of weight loss- for pain, general health, chronic disease prevention, immune health, mental health.  -Will monitor weight every visit.  -Consider Roobois tea daily.  -Discussed the benefits of intermittent fasting. -Discussed foods that can assist in weight loss: 1) leafy greens- high in fiber and nutrients 2) dark chocolate- improves metabolism (if prefer sweetened, best to sweeten with honey instead of sugar).  3) cruciferous vegetables- high in fiber and protein 4) full fat yogurt: high in healthy fat, protein, calcium, and probiotics 5) apples- high in a variety of phytochemicals 6) nuts- high in fiber and protein that increase feelings of fullness 7) grapefruit: rich in nutrients, antioxidants, and fiber (not to be taken with anticoagulation) 8) beans- high in protein and fiber 9) salmon- has high quality protein and healthy fats 10) green tea- rich in polyphenols 11) eggs- rich in choline and vitamin D 12) tuna- high protein, boosts metabolism 13) avocado- decreases visceral abdominal fat 14) chicken (pasture raised): high in protein and iron 15) blueberries- reduce abdominal fat and cholesterol 16) whole grains- decreases calories retained during digestion, speeds metabolism 17) chia seeds- curb appetite 18) chilies- increases fat metabolism  -Discussed supplements that can be used:  1) Metatrim 400mg  BID 30 minutes before breakfast and dinner  2) Sphaeranthus indicus and Garcinia mangostana (combinations of these and #1 can be found in capsicum and zychrome  3) green coffee bean extract 400mg  twice per day or Irvingia (african mango) 150 to 300mg  twice per day.

## 2021-05-26 ENCOUNTER — Ambulatory Visit (INDEPENDENT_AMBULATORY_CARE_PROVIDER_SITE_OTHER): Payer: Managed Care, Other (non HMO) | Admitting: Neurology

## 2021-05-26 DIAGNOSIS — I69398 Other sequelae of cerebral infarction: Secondary | ICD-10-CM

## 2021-05-26 DIAGNOSIS — R569 Unspecified convulsions: Secondary | ICD-10-CM | POA: Diagnosis not present

## 2021-06-03 ENCOUNTER — Other Ambulatory Visit: Payer: Self-pay | Admitting: Physical Medicine & Rehabilitation

## 2021-06-05 ENCOUNTER — Telehealth: Payer: Self-pay

## 2021-06-05 ENCOUNTER — Other Ambulatory Visit: Payer: Self-pay | Admitting: Adult Health

## 2021-06-05 DIAGNOSIS — R569 Unspecified convulsions: Secondary | ICD-10-CM

## 2021-06-05 DIAGNOSIS — I63312 Cerebral infarction due to thrombosis of left middle cerebral artery: Secondary | ICD-10-CM

## 2021-06-05 NOTE — Telephone Encounter (Signed)
Contacted pt mother, informed her that recent EEG did not show any abnormality or evidence of seizure activity but this does not necessarily rule out seizures. Shanda Bumps would recommend he continue Keppra for seizure prevention and recommend that we repeat MR brain to ensure no new strokes caused recent seizure. Advised He will be called to set up date to complete and to give [place] a call back with questions as they had none at this time and was appreciative.

## 2021-06-05 NOTE — Telephone Encounter (Signed)
-----   Message from Justin Austin, NP sent at 06/05/2021  3:54 PM EST ----- Patient/mother wished to be contact via telephone for results - please advise them that recent EEG did not show any abnormality or evidence of seizure activity but this does not necessarily rule out seizures.  I would recommend he continue Keppra for seizure prevention. I do recommend that we repeat MR brain to ensure no new strokes caused recent seizure. He will be called to set up date to complete. Thank you

## 2021-06-09 ENCOUNTER — Ambulatory Visit: Payer: Managed Care, Other (non HMO) | Admitting: Cardiology

## 2021-06-12 ENCOUNTER — Other Ambulatory Visit: Payer: Self-pay | Admitting: Adult Health

## 2021-06-12 DIAGNOSIS — I63312 Cerebral infarction due to thrombosis of left middle cerebral artery: Secondary | ICD-10-CM

## 2021-06-12 DIAGNOSIS — R569 Unspecified convulsions: Secondary | ICD-10-CM

## 2021-06-16 ENCOUNTER — Ambulatory Visit: Payer: BC Managed Care – PPO | Admitting: Occupational Therapy

## 2021-06-16 ENCOUNTER — Ambulatory Visit: Payer: BC Managed Care – PPO | Attending: Family Medicine | Admitting: Physical Therapy

## 2021-06-16 ENCOUNTER — Ambulatory Visit: Payer: BC Managed Care – PPO | Admitting: Speech Pathology

## 2021-06-16 ENCOUNTER — Other Ambulatory Visit: Payer: Self-pay

## 2021-06-16 ENCOUNTER — Encounter: Payer: Self-pay | Admitting: Speech Pathology

## 2021-06-16 ENCOUNTER — Encounter: Payer: Self-pay | Admitting: Physical Therapy

## 2021-06-16 DIAGNOSIS — I69318 Other symptoms and signs involving cognitive functions following cerebral infarction: Secondary | ICD-10-CM | POA: Insufficient documentation

## 2021-06-16 DIAGNOSIS — R278 Other lack of coordination: Secondary | ICD-10-CM | POA: Diagnosis present

## 2021-06-16 DIAGNOSIS — R2681 Unsteadiness on feet: Secondary | ICD-10-CM | POA: Insufficient documentation

## 2021-06-16 DIAGNOSIS — R29898 Other symptoms and signs involving the musculoskeletal system: Secondary | ICD-10-CM | POA: Diagnosis present

## 2021-06-16 DIAGNOSIS — I69351 Hemiplegia and hemiparesis following cerebral infarction affecting right dominant side: Secondary | ICD-10-CM | POA: Diagnosis not present

## 2021-06-16 DIAGNOSIS — R4701 Aphasia: Secondary | ICD-10-CM | POA: Insufficient documentation

## 2021-06-16 DIAGNOSIS — R2689 Other abnormalities of gait and mobility: Secondary | ICD-10-CM | POA: Diagnosis present

## 2021-06-16 NOTE — Therapy (Signed)
[facility] [address] [place], [place], [address] Phone: [phone]   Fax:  [phone]  Physical Therapy Evaluation  Patient Details  Name: Justin Mason MRN: [id number] Date of Birth: 06-16-1981 Referring Provider (PT): Ihor Austin, NP   Encounter Date: 06/16/2021   PT End of Session - 06/16/21 2051     Visit Number 1    Number of Visits 17    Date for PT Re-Evaluation 08/15/21    Authorization Type BCBS    PT Start Time 1020    PT Stop Time 1100    PT Time Calculation (min) 40 min    Activity Tolerance Patient tolerated treatment well    Behavior During Therapy --   occasional frustration due to communication deficits due to aphasia            Past Medical History:  Diagnosis Date   AKI (acute kidney injury) (HCC)    Arthritis    Cardiac murmur 05/24/2020   Cellulitis 01/03/2015   Cellulitis of right leg    Cellulitis of right lower extremity 01/03/2015   Essential hypertension    GERD (gastroesophageal reflux disease)    High cholesterol    Hyperlipemia    Hypertension    Morbid obesity (HCC) 05/24/2020   OSA (obstructive sleep apnea)    Rotator cuff tear, right 09/08/2011   Sepsis (HCC) 01/03/2015   Stroke (HCC)    Type 2 diabetes mellitus with hyperglycemia (HCC)     Past Surgical History:  Procedure Laterality Date   INCISION AND DRAINAGE FOOT  1991-1992   STEPPED ON NAIL / INFECTED WOUND   IR ANGIO INTRA EXTRACRAN SEL COM CAROTID INNOMINATE BILAT MOD SED  07/24/2020   IR ANGIO VERTEBRAL SEL SUBCLAVIAN INNOMINATE UNI L MOD SED  07/24/2020   IR ANGIO VERTEBRAL SEL VERTEBRAL UNI R MOD SED  07/24/2020   RADIOLOGY WITH ANESTHESIA N/A 07/24/2020   Procedure: IR WITH ANESTHESIA - CODE STROKE;  Surgeon: Radiologist, Medication, MD;  Location: [facility] OR;  Service: Radiology;  Laterality: N/A;   SHOULDER SURGERY     September 08, 2011    There were no vitals filed for this visit.    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 with rollator; pt's mother reports pt just started standing up in June  - has been using rollator since July;  Pt had CVA on 07-24-20 and was transferred to inpatient rehab on 08-09-20 and discharged home on 09-06-20 with home health.    Patient is accompained by: Family member   mother [name]   Pertinent History HTN, morbid obesity, OSA, Lt CVA on 07-24-20, acute kidney injury, expressive aphasia, chronic pain bil. knees, type 2 DM, h/o Rt rotator cuff sx 09/2011, possible recent siezure    Patient Stated Goals walk with the cane - not have to use the rollator    Currently in Pain? No/denies                [facility] PT Assessment - 06/16/21 2040       Assessment   Medical Diagnosis Lt CVA with Rt hemiparesis    Referring Provider (PT) Ihor Austin, NP    Onset Date/Surgical Date 07/24/20    Hand Dominance Right    Prior Therapy HH ended in June  Precautions   Precautions Fall    Precaution Comments hx of possible seizure      Balance Screen   Has the patient fallen in the past 6 months Yes    How many times? 1    Has the patient had a decrease in activity level because of a fear of falling?  Yes    Is the patient reluctant to leave their home because of a fear of falling?  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 applied for [employer]   Leisure tractor shows/pulls, work on farm, split wood, hunting, work on Quarry manager  --   pt frustrated at times     Education administrator Appears Intact      ROM / Strength   AROM / PROM / Strength Strength;AROM      AROM   Overall AROM  Within functional limits for tasks performed   for RLE     Strength   Overall Strength --   flexor synergy pattern, R hemiparesis   Overall Strength Comments RLE strength is WFL's for hip flexors, abductors, quads, hamstrings and dorsiflexors      Bed Mobility   Bed Mobility Rolling Left;Supine to Sit;Sit to Supine    Rolling Left Independent    Supine to Sit Independent    Sit to Supine Independent      Transfers   Transfers Sit to Stand    Sit to Stand 6: Modified independent (Device/Increase time)    Five time sit to stand comments  39.06   with LUE support from mat   Number of Reps Other reps (comment)   5     Ambulation/Gait   Ambulation/Gait Yes    Ambulation/Gait Assistance 5: Supervision;4: Min guard    Ambulation/Gait Assistance Details pt modified independent with use of bariatric rollator    Ambulation Distance (Feet) 75 Feet   99' with SBQC with CGA   Assistive device Rolling walker;Small based quad cane    Gait Pattern Step-through pattern    Ambulation Surface Level;Indoor    Gait velocity 21.62 secs with rollator = 1.52 ft/sec      Standardized Balance Assessment   Standardized Balance Assessment Timed Up and Go Test      Timed Up and Go Test   Normal TUG (seconds) 31.19   with rollator                       Objective measurements completed on examination: See above findings.                PT Education - 06/16/21 2050     Education Details eval results - LOS to be 8 weeks    Person(s) Educated Patient;Parent(s)    Methods Explanation    Comprehension Verbalized understanding              PT Short Term Goals - 06/16/21 2105       PT SHORT TERM GOAL #  1   Title Pt will amb. 39' with SBQC with CGA on flat, even surface for increased community accessibility.     Time 4    Period Weeks    Status New    Target Date 07/18/21      PT SHORT TERM GOAL #2   Title Increase gait velocity from 1.52 ft/sec to >/= 1.9 ft/sec with use of rollator for increased gait efficiency.    Baseline 21.62 secs = 1.52 ft/sec with rollator    Time 4    Period Weeks    Status New    Target Date 07/18/21      PT SHORT TERM GOAL #3   Title Improve TUG score from 31.19 secs with rollator to </= 26 secs with rollator for reduced fall risk.    Time 4    Period Weeks    Status New    Target Date 07/18/21      PT SHORT TERM GOAL #4   Title Pt will participate in [place] assessment.    Time 4    Period Weeks    Status New    Target Date 07/18/21      PT SHORT TERM GOAL #5   Title Negotiate steps with use of 1 handrail and SBQC with min to CGA.    Time 4    Period Weeks    Status New    Target Date 07/18/21               PT Long Term Goals - 06/16/21 2112       PT LONG TERM GOAL #1   Title Pt will be modified independent without device for household ambulation.    Time 8    Period Weeks    Status New    Target Date 08/15/21      PT LONG TERM GOAL #2   Title Pt will amb. 500' with SBQC on outdoor paved surfaces with supervision for increased community accessibiity.    Time 8    Period Weeks    Status New    Target Date 08/15/21      PT LONG TERM GOAL #3   Title Negotiate 4 steps with quad cane without use of hand rail with supervision.    Time 8    Period Weeks    Status New    Target Date 08/15/21      PT LONG TERM GOAL #4   Title Improve TUG score to </= 23 secs with SBQC for reduced fall risk.    Baseline 31.19 secs with rollator on 06-16-21    Time 8    Period Weeks    Status New    Target Date 08/15/21      PT LONG TERM GOAL #5   Title Improve Berg score by at least 8 points to reduce fall risk.    Time 8    Period Weeks    Status New    Target Date 08/15/21      Additional Long Term Goals   Additional Long Term Goals Yes       PT LONG TERM GOAL #6   Title Independent in HEP for balance and strengthening exs.    Time 8    Period Weeks    Status New    Target Date 08/15/21                    Plan - 06/16/21 2053     Clinical Impression Statement Pt  is a 40 yr old gentleman s/p Lt CVA on 07-24-20; pt presents with Rt spastic hemiplegia RUE with RLE AROM and strength WFL's. Pt was admitted to [facility] hospital on 07-24-20 with Lt CVA and was transferred to inpatient rehab on 08-09-20 until D/C home on 09-06-20.  Pt received HH therapies until June, however, pt's mother states pt was unable to stand until May 2022 and began working on walking in June. Pt is now using rollator for assistance with ambulation and has returned to driving.  Pt did amb. approx. 58' with SBQC during eval with CGA to min assist.  Pt is at fall risk per TUG score of 31.19 secs with rollator and has decreased gait velocity of 1.52 ft/sec with use of rollator.  Pt will benefit from PT to address gait and balance deficits and decreased coordination RLE.    Personal Factors and Comorbidities Comorbidity 2;Time since onset of injury/illness/exacerbation;Past/Current Experience;Fitness;Profession    Comorbidities HTN, morbid obesity, OSA, Lt CVA with Rt spastic hemiplegia 07-24-20, espressive aphasia, chronic pain bil. knees, type 2 DM, h/o Rt rotator cuff repair 09/2011, possible recent seizure    Examination-Activity Limitations Bed Mobility;Locomotion Level;Transfers;Squat;Bend;Stairs;Toileting    Examination-Participation Restrictions Occupation;Community Activity;Interpersonal Relationship;Laundry;Shop;Meal Prep;Yard Work;Cleaning    Stability/Clinical Decision Making Evolving/Moderate complexity    Clinical Decision Making Moderate    Rehab Potential Good    PT Frequency 2x / week    PT Duration 8 weeks    PT Treatment/Interventions ADLs/Self Care Home Management;DME Instruction;Gait training;Stair training;Therapeutic activities;Therapeutic  exercise;Balance training;Neuromuscular re-education;Patient/family education    PT Next Visit Plan do Berg; begin HEP instruction - standing balance & sit to stand; step training if time allows    Recommended Other Services Pt is receiving OT and ST    Consulted and Agree with Plan of Care Patient;Family member/caregiver    Family Member Consulted mother [name]             Patient will benefit from skilled therapeutic intervention in order to improve the following deficits and impairments:  Abnormal gait, Decreased balance, Decreased activity tolerance, Decreased coordination, Decreased strength, Impaired tone, Impaired UE functional use, Obesity  Visit Diagnosis: Hemiplegia and hemiparesis following cerebral infarction affecting right dominant side (HCC) - Plan: PT plan of care cert/re-cert  Other abnormalities of gait and mobility - Plan: PT plan of care cert/re-cert  Unsteadiness on feet - Plan: PT plan of care cert/re-cert  Other lack of coordination - Plan: PT plan of care cert/re-cert     Problem List Patient Active Problem List   Diagnosis Date Noted   Primary osteoarthritis of left knee 11/07/2020   Gout flare 09/06/2020   Type 2 diabetes mellitus (HCC) 09/06/2020   Chronic pain of left knee    Expressive aphasia    Chronic pain of right knee    Right elbow pain    Acute blood loss anemia    Right hemiparesis (HCC)    Right hemiplegia (HCC)    Hyponatremia    AKI (acute kidney injury) (HCC)    Benign essential HTN    Pressure injury of skin 08/09/2020   Dyslipidemia    Controlled type 2 diabetes mellitus with hyperglycemia, without long-term current use of insulin (HCC)    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   Middle cerebral artery stenosis, 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   Morbid obesity (HCC) 05/24/2020   Cardiac murmur 05/24/2020   Arthritis    High cholesterol    Hyperlipemia    Type 2 diabetes mellitus with hyperglycemia (HCC)  Cellulitis 01/03/2015   Cellulitis  of right lower extremity 01/03/2015   Sepsis (HCC) 01/03/2015   Hypertension    GERD (gastroesophageal reflux disease)    Essential hypertension    Cellulitis of right leg    Rotator cuff tear, right 09/08/2011    Kary Kos, PT 06/16/2021, 9:24 PM  [facility] [address] [place], [place], [address] Phone: [phone]   Fax:  [phone]  Name: Justin Mason MRN: [id number] Date of Birth: 1980-07-10

## 2021-06-16 NOTE — Therapy (Signed)
[facility] [address] [place], [place], [address] Phone: [phone]   Fax:  [phone]  Occupational Therapy Evaluation  Patient Details  Name: Justin Mason MRN: [id number] Date of Birth: 1981/05/12 Referring Provider (OT): Justin Austin, NP   Encounter Date: 06/16/2021   OT End of Session - 06/16/21 1509     Visit Number 1    Number of Visits 25    Date for OT Re-Evaluation 09/14/21    Authorization Type [employer]--awaiting insurance verification    OT Start Time 1105    OT Stop Time 1145    OT Time Calculation (min) 40 min    Activity Tolerance Patient tolerated treatment well    Behavior During Therapy Flat affect             Past Medical History:  Diagnosis Date   AKI (acute kidney injury) (HCC)    Arthritis    Cardiac murmur 05/24/2020   Cellulitis 01/03/2015   Cellulitis of right leg    Cellulitis of right lower extremity 01/03/2015   Essential hypertension    GERD (gastroesophageal reflux disease)    High cholesterol    Hyperlipemia    Hypertension    Morbid obesity (HCC) 05/24/2020   OSA (obstructive sleep apnea)    Rotator cuff tear, right 09/08/2011   Sepsis (HCC) 01/03/2015   Stroke (HCC)    Type 2 diabetes mellitus with hyperglycemia (HCC)     Past Surgical History:  Procedure Laterality Date   INCISION AND DRAINAGE FOOT  1991-1992   STEPPED ON NAIL / INFECTED WOUND   IR ANGIO INTRA EXTRACRAN SEL COM CAROTID INNOMINATE BILAT MOD SED  07/24/2020   IR ANGIO VERTEBRAL SEL SUBCLAVIAN INNOMINATE UNI L MOD SED  07/24/2020   IR ANGIO VERTEBRAL SEL VERTEBRAL UNI R MOD SED  07/24/2020   RADIOLOGY WITH ANESTHESIA N/A 07/24/2020   Procedure: IR WITH ANESTHESIA - CODE STROKE;  Surgeon: Radiologist, Medication, MD;  Location: [facility] OR;  Service: Radiology;  Laterality: N/A;   SHOULDER SURGERY     September 08, 2011    There were no vitals filed for this visit.   Subjective Assessment - 06/16/21 1110      Subjective  Pt reports that he can't use R hand    Patient is accompanied by: Family member   mother--Justin Mason   Pertinent History Spastic hemiplegia of R side as late effect of cerebral infarction 07/24/20.     PMH:  obesity, HTN, hyperlipidemia, arthritis, DM, OSA, hx of R RTC tear 09/2011, possible recent seizure    Limitations possible seizure hx    Patient Stated Goals be able to use R hand    Currently in Pain? No/denies               [facility] OT Assessment - 06/16/21 1110       Assessment   Medical Diagnosis Lt CVA with Rt hemiparesis    Referring Provider (OT) Justin Austin, NP    Onset Date/Surgical Date 07/24/20    Hand Dominance Right    Prior Therapy home therapies ended in June      Precautions   Precautions Fall    Precaution Comments hx of possible seizure      Balance Screen   Has the patient fallen in the past 6 months Yes    How many times? 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 applied for [employer]   Leisure tractor shows/pulls, work on farm, split wood, hunting, work on Tree surgeon      ADL   Eating/Feeding Needs assist with cutting food    Grooming Modified independent    Upper Body Bathing Minimal assistance    Lower Body Bathing Supervision/safety    Upper Body Dressing Minimal assistance    Lower Body Dressing Minimal assistance   for Solicitor - Clothing Manipulation Minimal assistance    Toileting -  Hygiene Maximal assistance    Dispensing optician      IADL    Light Housekeeping --   not currently performing   Prior Level of Function Meal Prep performed simple tasks (mother did most)    Meal Prep --   not performing   Training and development officer own vehicle   someone is always with him   Medication Management --   mother performing     Mobility   Mobility Status History of falls    Mobility Status Comments Pt uses rollator in community with longer distances, small base quad cane in the home      Written Expression   Handwriting --   writing with L nondominant hand     Vision - History   Baseline Vision No visual deficits    Additional Comments Pt denies changes      Cognition   Overall Cognitive Status Impaired/Different from baseline   mother reports mild change   Behaviors --   pt frustrated at times   Cognition Comments Mother reports possible decr memory and pt reports changes in reading/comprehension.  Pt appears to demo slow response time.      Sensation   Light Touch Appears Intact   per pt report     Coordination   Gross Motor Movements are Fluid and Coordinated No   difficulty coordinating movements of RUE for grasp  and needed min-mod facilitation for coordination and thumb abduction/ext   Fine Motor Movements are Fluid and Coordinated No    Box and Blocks R-1blocks, L-46blocks    Other not typically using RUE functionally      Tone   Assessment Location Right Upper Extremity      ROM / Strength   AROM / PROM / Strength AROM;Strength      AROM   Overall AROM  Deficits    Overall AROM Comments Pt with pre-morbid R shoulder limitations due to hx of RTC repair in  2013 (not able to raise arm overhead/above 90*) and pt/mother report R hand 5th digit PIP flex (limited ext) and decr IP flex of 2nd digit prior to CVA.  Today, pt demo approx 20* AAROM shoulder flex, approx 20* shoulder abduction, able to perform elbow flex (synergy pattern), AAROM elbow ext, approx 25% supination, approx 75% wrist flex/ext,  approx 90% gross finger  flex/ext, able to oppose to all digits but demo limited MP/IP ext (held in flex)      Strength   Overall Strength  Deficits   flexor synergy pattern, R hemiparesis     Hand Function   Right Hand Grip (lbs) 32    Left Hand Grip (lbs) 106      RUE Tone   RUE Tone --   mild-mod spasticity, moves with flexor syngery pattern                               OT Short Term Goals - 06/16/21 1516       OT SHORT TERM GOAL #1   Title Pt will be independent with initial HEP.--check STGs 07/17/21    Time 4    Period Weeks    Status New      OT SHORT TERM GOAL #2   Title Pt will be independent with splint wear/care for improved R hand positioning.    Time 4    Period Weeks    Status New      OT SHORT TERM GOAL #3   Title Pt demo at least 30* active R shoulder flex for functional reach.    Time 4    Period Weeks    Status New      OT SHORT TERM GOAL #4   Title Pt will be able to consistently use RUE as stabilizer/gross assist for simple tasks.    Time 4    Period Weeks    Status New      OT SHORT TERM GOAL #5   Title Pt will be able to perform low-level reach to grasp/release small cylinder object without assist.    Time 4    Period Weeks    Status New               OT Long Term Goals - 06/16/21 1520       OT LONG TERM GOAL #1   Title Pt will be independent with updated HEP.--check LTGs 09/14/21    Time 12    Period Weeks    Status New      OT LONG TERM GOAL #2   Title Pt will perform BADLs mod I.    Time 12    Period Weeks    Status New      OT LONG TERM GOAL #3   Title Pt will be able to perform simple snack prep mod I.    Time 12    Period Weeks    Status New      OT LONG TERM GOAL #4   Title Pt will perform simple home maintenance task mod I.    Time 12    Period Weeks    Status New      OT LONG TERM GOAL #5   Title Pt will demo at least 45* R shoulder flex for active reach.    Time 12    Period Weeks    Status New      OT  LONG TERM GOAL #6   Title Pt will improve RUE reach/coordination for ADLs as shown by improve score on box and blocks test to 8.    Baseline R-1 lbock    Time 12    Period Weeks    Status New                   Plan - 06/16/21 1509     Clinical Impression Statement Pt is a 40 y.o. male referred to occupational therapy for spastic hemiplegia of R side due to  cerebral infarction (07/24/20).  Pt with PMH that also includes:obesity, HTN, hyperlipidemia, arthritis, DM, OSA, hx of R RTC tear 09/2011, possible recent seizure, and hx of 2nd digit and 5th digit ROM limitations prior to CVA.  Pt was independent and working prior to CVA.  Pt now requires assist for BADLs and IADLs. Pt presents today with spastic R hemiparesis, decr coordination, decr balance/functional mobility, and cognitive deficits.  Pt would benefit from occupational therapy to address these deficits for incr safety, ease, and independence with ADLs/IADLs and incr dominant RUE functional use.    OT Occupational Profile and History Detailed Assessment- Review of Records and additional review of physical, cognitive, psychosocial history related to current functional performance    Occupational performance deficits (Please refer to evaluation for details): ADL's;IADL's;Work;Leisure    Body Structure / Function / Physical Skills ADL;Decreased knowledge of use of DME;Strength;Tone;GMC;Dexterity;Balance;Body mechanics;UE functional use;ROM;IADL;Mobility;Coordination;FMC    Cognitive Skills Memory;Understand    Rehab Potential Good    Clinical Decision Making Several treatment options, min-mod task modification necessary    Comorbidities Affecting Occupational Performance: Presence of comorbidities impacting occupational performance    Comorbidities impacting occupational performance description: hx or R RTC tear with prior limitations, possible seizure hx, arthritis, obesity    Modification or Assistance to Complete Evaluation   Min-Moderate modification of tasks or assist with assess necessary to complete eval    OT Frequency 2x / week    OT Duration 12 weeks   +eval   OT Treatment/Interventions Self-care/ADL training;Moist Heat;DME and/or AE instruction;Fluidtherapy;Splinting;Balance training;Therapeutic activities;Ultrasound;Therapeutic exercise;Cognitive remediation/compensation;Passive range of motion;Functional Mobility Training;Neuromuscular education;Cryotherapy;Electrical Stimulation;Energy conservation;Manual Therapy;Paraffin;Patient/family education    Plan HEP for neuro re-ed, light wt. bearing/AAROM shoulder flex and elbow ext    Consulted and Agree with Plan of Care Patient;Family member/caregiver    Family Member Consulted mother             Patient will benefit from skilled therapeutic intervention in order to improve the following deficits and impairments:   Body Structure / Function / Physical Skills: ADL, Decreased knowledge of use of DME, Strength, Tone, GMC, Dexterity, Balance, Body mechanics, UE functional use, ROM, IADL, Mobility, Coordination, [facility] Cognitive Skills: Memory, Understand     Visit Diagnosis: Spastic hemiplegia of right dominant side as late effect of cerebral infarction [facility])  Other lack of coordination  Other symptoms and signs involving the musculoskeletal system  Unsteadiness on feet  Other symptoms and signs involving cognitive functions following cerebral infarction    Problem List Patient Active Problem List   Diagnosis Date Noted   Primary osteoarthritis of left knee 11/07/2020   Gout flare 09/06/2020   Type 2 diabetes mellitus (HCC) 09/06/2020   Chronic pain of left knee    Expressive aphasia    Chronic pain of right knee    Right elbow pain    Acute blood loss anemia    Right hemiparesis (HCC)    Right hemiplegia (HCC)    Hyponatremia    AKI (acute kidney injury) (HCC)    Benign essential HTN    Pressure injury of skin 08/09/2020   Dyslipidemia     Controlled type 2 diabetes mellitus with hyperglycemia, without long-term current use of insulin (HCC)    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   Middle cerebral artery stenosis, 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   Morbid obesity (HCC) 05/24/2020   Cardiac murmur 05/24/2020   Arthritis    High cholesterol    Hyperlipemia    Type 2 diabetes mellitus with hyperglycemia (HCC)  Cellulitis 01/03/2015   Cellulitis of right lower extremity 01/03/2015   Sepsis (HCC) 01/03/2015   Hypertension    GERD (gastroesophageal reflux disease)    Essential hypertension    Cellulitis of right leg    Rotator cuff tear, right 09/08/2011    [facility], OT 06/16/2021, 3:22 PM  [place] [address] [place], [place], [address] Phone: [phone]   Fax:  [phone]  Name: Justin Mason MRN: [id number] Date of Birth: 08/02/1980  Willa Frater, OTR/L [facility] [address] [place], [place]  [address] [phone] phone [phone] 06/16/21 3:23 PM

## 2021-06-17 NOTE — Therapy (Signed)
[facility] [address] [place], [place], [address] Phone: [phone]   Fax:  [phone]  Speech Language Pathology Evaluation  Patient Details  Name: Justin Mason MRN: [id number] Date of Birth: 04-07-1981 Referring Provider (SLP): Ihor Austin NP   Encounter Date: 06/16/2021   End of Session - 06/17/21 1445     Visit Number 1    Number of Visits 25    Date for SLP Re-Evaluation 09/12/21    SLP Start Time 0932    SLP Stop Time  1015    SLP Time Calculation (min) 43 min             Past Medical History:  Diagnosis Date   AKI (acute kidney injury) (HCC)    Arthritis    Cardiac murmur 05/24/2020   Cellulitis 01/03/2015   Cellulitis of right leg    Cellulitis of right lower extremity 01/03/2015   Essential hypertension    GERD (gastroesophageal reflux disease)    High cholesterol    Hyperlipemia    Hypertension    Morbid obesity (HCC) 05/24/2020   OSA (obstructive sleep apnea)    Rotator cuff tear, right 09/08/2011   Sepsis (HCC) 01/03/2015   Stroke (HCC)    Type 2 diabetes mellitus with hyperglycemia (HCC)     Past Surgical History:  Procedure Laterality Date   INCISION AND DRAINAGE FOOT  1991-1992   STEPPED ON NAIL / INFECTED WOUND   IR ANGIO INTRA EXTRACRAN SEL COM CAROTID INNOMINATE BILAT MOD SED  07/24/2020   IR ANGIO VERTEBRAL SEL SUBCLAVIAN INNOMINATE UNI L MOD SED  07/24/2020   IR ANGIO VERTEBRAL SEL VERTEBRAL UNI Justin Mason MOD SED  07/24/2020   RADIOLOGY WITH ANESTHESIA N/A 07/24/2020   Procedure: IR WITH ANESTHESIA - CODE STROKE;  Surgeon: Radiologist, Medication, MD;  Location: [facility] OR;  Service: Radiology;  Laterality: N/A;   SHOULDER SURGERY     September 08, 2011    There were no vitals filed for this visit.       SLP Evaluation [facility] - 06/17/21 1435       SLP Visit Information   SLP Received On 06/16/21    Referring Provider (SLP) Ihor Austin NP    Onset Date 07/24/20    Medical Diagnosis  CVA      Subjective   Patient/Family Stated Goal To get to talking better"      General Information   HPI Justin Mason is a 40 y.o. male with a hx of hypertension, hyperlipidemia, DM2, s/p CVA, obstructive sleep apnea and morbid obesity.  He has a history of difficult to control high blood pressure and has been followed by Dr. Tomie China.  Echocardiogram obtained on 07/04/2020 showed EF 60 to 65%, grade 1 DD.  His last visit with Dr. Tomie China was in January 2022, he was started on clonidine 0.1 mg twice a day.  He was diagnosed with COVID-19 infection in January and subsequently went to the hospital on 07/24/2020 with aphasia, right facial droop and right upper extremity weakness.  CT was concerning for left sided infarct.  CTA showed short segment occlusion in MCA, left ICA occlusion proximal left M1 and stenosis of PCAs.  He was outside the window for tPA.  Attempted angioplasty of MCA by interventional radiology was unsuccessful due to resistance, suspect occlusion to be chronic.  He was hospitalized 07/24/20 to 09/05/20 including CIR.    Mobility Status RW with a seat - PT eval is today. Prefers to  sit in walker chair      Prior Functional Status   Cognitive/Linguistic Baseline Within functional limits    Type of Home House     Lives With Family    Available Support Family    Vocation On disability      Cognition   Overall Cognitive Status Impaired/Different from baseline      Auditory Comprehension   Overall Auditory Comprehension Impaired    Yes/No Questions Impaired    Basic Biographical Questions 76-100% accurate    Basic Immediate Environment Questions 75-100% accurate    Complex Questions 25-49% accurate    Commands Impaired    Two Step Basic Commands 75-100% accurate    Multistep Basic Commands 50-74% accurate    Conversation Simple    EffectiveTechniques Extra processing time;Pausing;Repetition;Visual/Gestural cues      Visual Recognition/Discrimination   Discrimination Not  tested      Reading Comprehension   Reading Status Impaired    Word level 76-100% accurate    Sentence Level 76-100% accurate    Paragraph Level 51-75% accurate      Expression   Primary Mode of Expression Verbal      Verbal Expression   Overall Verbal Expression Impaired    Initiation No impairment    Automatic Speech --   WFL   Level of Generative/Spontaneous Verbalization Sentence    Repetition Impaired    Level of Impairment Sentence level;Phrase level    Naming Impairment    Responsive Not tested    Confrontation 75-100% accurate    Convergent Not tested    Divergent 25-49% accurate    Verbal Errors Phonemic paraphasias    Pragmatics No impairment    Effective Techniques Sentence completion;Semantic cues;Phonemic cues      Written Expression   Dominant Hand Right    Written Expression Exceptions to [facility]    Dictation Ability Phrase    Self Formulation Ability Word      Oral Motor/Sensory Function   Overall Oral Motor/Sensory Function Appears within functional limits for tasks assessed      Motor Speech   Overall Motor Speech Impaired    Respiration Within functional limits    Phonation Normal    Resonance Within functional limits    Intelligibility Intelligibility reduced    Word 75-100% accurate    Phrase 75-100% accurate    Sentence 75-100% accurate    Conversation 75-100% accurate    Motor Planning Impaired    Level of Impairment Sentence    Effective Techniques Slow rate;Pause      Standardized Assessments   Standardized Assessments  Other Assessment   Quick Apahsia Batter (QAB)   Other Assessment Quick Aphasia Battery (QAB)   6.05/10 - moderate aphasia                            SLP Education - 06/17/21 1437     Education Details goals, freqeuncy    Person(s) Educated Patient;Parent(s)    Methods Explanation;Verbal cues    Comprehension Verbalized understanding;Verbal cues required;Need further instruction               SLP Short Term Goals - 06/17/21 1546       SLP SHORT TERM GOAL #1   Title Pt will name 10 items in personally relevant category with rare min A over 2 sessions    Time 4    Period Weeks    Status New      SLP  SHORT TERM GOAL #2   Title Pt will generate simple sentences in structured tasks with rare min A 9/10 opportunitites    Time 4    Period Weeks    Status New      SLP SHORT TERM GOAL #3   Title Pt and family will implement 2 multimodal communication strategies to  support pt's auditory comprehension to report 25% subjectively reduced frustration with communication at home.    Time 4    Period Weeks      SLP SHORT TERM GOAL #4   Title Pt will ID and self correct errors in text or written communication 8/10x with usual min A    Time 4    Period Weeks    Status New      SLP SHORT TERM GOAL #5   Title Pt will use verbal or gestural compensations for aphasia as needed 8/10x on structured naming tasks with occasional min A    Time 4    Period Weeks    Status New              SLP Long Term Goals - 06/17/21 1548       SLP LONG TERM GOAL #1   Title Pt will comprehend 4 turns in simple conversation with no requests for repetition over 3 sessions    Time 12    Period Weeks    Status New      SLP LONG TERM GOAL #2   Title In 5 minute simple conversation, Justin Mason will successfully use multimodal compensations for aphasia as needed 3/5x with occasional min A    Time 12    Period Weeks    Status New      SLP LONG TERM GOAL #3   Title Pt will write complex sentences in structured task, such as VNeST, ID and self correct 8/10 errors with occasional min A    Time 12    Period Weeks    Status New      SLP LONG TERM GOAL #4   Title Pt will improve score on Cognitive Participation Bank - Short Form, by 3 points (original score 4/30)    Time 12    Period Weeks    Status New              Plan - 06/17/21 1534     Clinical Impression Statement Justin Mason is referred  b neurology due to 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 He is accompanied by his mother, Justin Mason. 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 Of note, prior to CVA, Justin Mason did live with his mother and she managed his medications and finances prior to CVA. They Mason report frustration with communciation. I asked Justin Mason what he does when he has difficulty,he replied, "Just forget it." Justin Mason endorsed that she "can be way off" when trying to understand Justin Mason message. These responses indicate that pt and family have not consistently implemented successful compensations or mulitmodal strategies at home.  Justin Mason and Justin Mason deny memory impairment. Justin Mason is driving short distances with family. Justin Mason is not comfortable leaving Justin Mason home alone even for short times as he has difficulty getting out of bed and is using bedside commode and urinal. The Communicative Participation Item Bank - short form, revealed a score of 4. Justin Mason noted he had "very much" difficulty, or a score of 0 on communciating something quickly, speaking with people  he does not know, communicating in the community, giving detailed information and communicating in a small group. Comprehension of simple yes/no questions was 50%.  In divergent naming task, Justin Mason named 6 animals and 3 'm'; words (15-20 is WNL) Spontaneous speech is telegraphic and halting with apraxic errors. Rapid alternating speech is irregular with mild halting and groping.No evidence or oral apraxia in non speech tasks. Repetition intact to2 syllable words. Written expression of 3 word sentence to dictation with significant extended time, aphasic errors which he ID'd inconsistently, unablel to self correct. 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 I recommend skilled ST to maximize communication for safety, reduce caregiver burden and improve life participation.    Speech Therapy Frequency 2x / week    Duration 12 weeks    Treatment/Interventions Language facilitation;Environmental controls;Cueing hierarchy;SLP instruction and feedback;Compensatory strategies;Functional tasks;Cognitive reorganization;Compensatory techniques;Patient/family education;Multimodal communcation approach;Internal/external aids    Potential to Achieve Goals Fair    Potential Considerations Severity of impairments             Patient will benefit from skilled therapeutic intervention in order to improve the following deficits and impairments:   Aphasia    Problem List Patient Active Problem List   Diagnosis Date Noted   Primary osteoarthritis of left knee 11/07/2020   Gout flare 09/06/2020   Type 2 diabetes mellitus (HCC) 09/06/2020   Chronic pain of left knee    Expressive aphasia    Chronic pain of right knee    Right elbow pain    Acute blood loss anemia    Right hemiparesis (HCC)    Right hemiplegia (HCC)    Hyponatremia    AKI (acute kidney injury) (HCC)    Benign essential HTN    Pressure injury of skin 08/09/2020   Dyslipidemia    Controlled type 2 diabetes mellitus with hyperglycemia, without long-term current use of insulin (HCC)    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   Middle cerebral artery stenosis, 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   Morbid obesity (HCC) 05/24/2020   Cardiac murmur 05/24/2020   Arthritis    High cholesterol    Hyperlipemia    Type 2 diabetes mellitus with hyperglycemia (HCC)    Cellulitis 01/03/2015   Cellulitis of right lower extremity 01/03/2015   Sepsis (HCC) 01/03/2015   Hypertension    GERD (gastroesophageal reflux disease)    Essential hypertension    Cellulitis of right leg    Rotator cuff tear, right 09/08/2011    Dossie Swor, Radene Journey,  CCC-SLP 06/17/2021, 3:54 PM  [facility] [address] [place], [place], [address] Phone: [phone]   Fax:  [phone]  Name: Justin Mason MRN: [id number] Date of Birth: 09/28/1980

## 2021-06-18 ENCOUNTER — Encounter: Payer: Self-pay | Admitting: Speech Pathology

## 2021-06-18 ENCOUNTER — Encounter: Payer: Self-pay | Admitting: Occupational Therapy

## 2021-06-24 ENCOUNTER — Encounter: Payer: Self-pay | Admitting: Occupational Therapy

## 2021-06-25 ENCOUNTER — Other Ambulatory Visit: Payer: Self-pay

## 2021-07-01 ENCOUNTER — Encounter: Payer: Self-pay | Admitting: Occupational Therapy

## 2021-07-03 ENCOUNTER — Encounter: Payer: Self-pay | Admitting: Occupational Therapy

## 2021-07-09 ENCOUNTER — Ambulatory Visit: Payer: BC Managed Care – PPO

## 2021-07-09 ENCOUNTER — Ambulatory Visit: Payer: BC Managed Care – PPO | Admitting: Occupational Therapy

## 2021-07-09 ENCOUNTER — Ambulatory Visit: Payer: BC Managed Care – PPO | Admitting: Physical Therapy

## 2021-07-14 ENCOUNTER — Ambulatory Visit: Payer: BC Managed Care – PPO

## 2021-07-14 ENCOUNTER — Ambulatory Visit: Payer: BC Managed Care – PPO | Admitting: Physical Therapy

## 2021-07-14 ENCOUNTER — Ambulatory Visit: Payer: BC Managed Care – PPO | Admitting: Occupational Therapy

## 2021-07-16 ENCOUNTER — Ambulatory Visit: Payer: BC Managed Care – PPO | Admitting: Occupational Therapy

## 2021-07-16 ENCOUNTER — Ambulatory Visit: Payer: BC Managed Care – PPO

## 2021-07-16 ENCOUNTER — Ambulatory Visit: Payer: BC Managed Care – PPO | Admitting: Physical Therapy

## 2021-07-21 ENCOUNTER — Ambulatory Visit: Payer: Managed Care, Other (non HMO) | Admitting: Cardiology

## 2021-07-22 ENCOUNTER — Other Ambulatory Visit: Payer: Self-pay

## 2021-07-22 ENCOUNTER — Ambulatory Visit (INDEPENDENT_AMBULATORY_CARE_PROVIDER_SITE_OTHER): Payer: 59 | Admitting: Physician Assistant

## 2021-07-22 ENCOUNTER — Encounter: Payer: Self-pay | Admitting: Physician Assistant

## 2021-07-22 VITALS — BP 115/71 | HR 51 | Ht 73.0 in | Wt 314.4 lb

## 2021-07-22 DIAGNOSIS — I10 Essential (primary) hypertension: Secondary | ICD-10-CM | POA: Diagnosis not present

## 2021-07-22 DIAGNOSIS — E119 Type 2 diabetes mellitus without complications: Secondary | ICD-10-CM | POA: Diagnosis not present

## 2021-07-22 DIAGNOSIS — E785 Hyperlipidemia, unspecified: Secondary | ICD-10-CM | POA: Diagnosis not present

## 2021-07-22 DIAGNOSIS — Z8673 Personal history of transient ischemic attack (TIA), and cerebral infarction without residual deficits: Secondary | ICD-10-CM | POA: Diagnosis not present

## 2021-07-22 DIAGNOSIS — Z79899 Other long term (current) drug therapy: Secondary | ICD-10-CM

## 2021-07-22 NOTE — Progress Notes (Signed)
Cardiology Office Note:    Date:  07/24/2021   ID:  Justin MedalJohn L Buttery, DOB 1981-01-25, MRN [id number]  PCP:  Eber JonesInman, Joanna R, NP   [facility] Providers Cardiologist:  Peter SwazilandJordan, MD     Referring MD: Eber JonesInman, Joanna R, NP   Chief Complaint  Patient presents with   Follow-up    Seen for Dr. SwazilandJordan    History of Present Illness:    Justin Mason is a 41 y.o. male with a hx of HTN, HLD, DM2, CVA, OSA and morbid obesity. Echocardiogram obtained on 07/04/2020 showed EF 60 to 65%, grade 1 DD. He was diagnosed with COVID-19 infection in January 2022 and subsequently went to the hospital on 07/24/2020 with aphasia, right facial droop and right upper extremity weakness.  CT was concerning for left sided infarct.  CTA showed short segment occlusion in MCA, left ICA occlusion proximal left M1 and stenosis of PCAs.  He was outside the window for tPA.  Attempted angioplasty of MCA by interventional radiology was unsuccessful due to resistance, suspect occlusion to be chronic.  Repeat echocardiogram shows EF of 60 to 65%.  He was prescribed aspirin and Plavix with plan to continue for 3 months then transition to aspirin alone.  During inpatient rehab, HCTZ was discontinued and Avapro was decreased to 150 mg daily to avoid hypotension.  Diabetic medication was reduced due to periods of hypoglycemic episodes.  Bilateral lower extremity Doppler was negative for DVT. 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  He also had a gout flare during recovery as well.  Patient was eventually discharged following inpatient rehab in early March 2022.  Plavix was discontinued on 5/16 after seeing Dr. Pearlean BrownieSethi for follow-up.  Due to side effect associated with clonidine, this was weaned off.  He was placed on hydralazine 25 mg twice a day for better BP control.  Patient was last seen by Dr. SwazilandJordan on 04/14/2021 at which time his blood  pressure was 132/64, hydralazine was increased to 50 mg twice a day.  Patient presents today for follow-up.  Blood pressure is very well controlled.  He continues to have quite significant right upper extremity weakness.  Even though he does not have significant lower extremity weakness, he walks around with a rolling walker.  He has no lower extremity edema, orthopnea or PND.  Past Medical History:  Diagnosis Date   AKI (acute kidney injury) (HCC)    Arthritis    Cardiac murmur 05/24/2020   Cellulitis 01/03/2015   Cellulitis of right leg    Cellulitis of right lower extremity 01/03/2015   Essential hypertension    GERD (gastroesophageal reflux disease)    High cholesterol    Hyperlipemia    Hypertension    Morbid obesity (HCC) 05/24/2020   OSA (obstructive sleep apnea)    Rotator cuff tear, right 09/08/2011   Sepsis (HCC) 01/03/2015   Stroke (HCC)    Type 2 diabetes mellitus with hyperglycemia (HCC)     Past Surgical History:  Procedure Laterality Date   INCISION AND DRAINAGE FOOT  1991-1992   STEPPED ON NAIL / INFECTED WOUND   IR ANGIO INTRA EXTRACRAN SEL COM CAROTID INNOMINATE BILAT MOD SED  07/24/2020   IR ANGIO VERTEBRAL SEL SUBCLAVIAN INNOMINATE UNI L MOD SED  07/24/2020   IR ANGIO VERTEBRAL SEL VERTEBRAL UNI R MOD SED  07/24/2020   RADIOLOGY WITH ANESTHESIA N/A  07/24/2020   Procedure: IR WITH ANESTHESIA - CODE STROKE;  Surgeon: Radiologist, Medication, MD;  Location: [facility] OR;  Service: Radiology;  Laterality: N/A;   SHOULDER SURGERY     September 08, 2011    Current Medications: Current Meds  Medication Sig   acetaminophen (TYLENOL) 325 MG tablet Take 1-2 tablets (325-650 mg total) by mouth every 4 (four) hours as needed for mild pain.   allopurinol (ZYLOPRIM) 100 MG tablet Take 100 mg by mouth as needed (gout).   Ascorbic Acid (VITAMIN C PO) Take 1 tablet by mouth every evening.   aspirin 81 MG EC tablet TAKE 1 TABLET (81 MG TOTAL) BY MOUTH DAILY. SWALLOW WHOLE.   baclofen  (LIORESAL) 10 MG tablet TAKE 1 TABLET BY MOUTH THREE TIMES DAILY   chlorthalidone (HYGROTON) 25 MG tablet Take 1 tablet (25 mg total) by mouth daily.   Cholecalciferol (VITAMIN D-3) 25 MCG (1000 UT) CAPS Take 1,000 Units by mouth every evening.   colchicine 0.6 MG tablet Take 1 tablet (0.6 mg total) by mouth daily. (Patient taking differently: Take 0.6 mg by mouth as needed.)   collagenase (SANTYL) ointment APPLY TOPICALLY DAILY.   dapagliflozin propanediol (FARXIGA) 10 MG TABS tablet Take 1 tablet (10 mg total) by mouth daily.   diclofenac Sodium (VOLTAREN) 1 % GEL Apply 2 g topically 4 (four) times daily.   gabapentin (NEURONTIN) 300 MG capsule TAKE 1 CAPSULE BY MOUTH THREE TIMES DAILY (Patient taking differently: 2 (two) times daily.)   glimepiride (AMARYL) 2 MG tablet Take by mouth.   hydrALAZINE (APRESOLINE) 50 MG tablet Take 1 tablet (50 mg total) by mouth 2 (two) times daily.   irbesartan (AVAPRO) 300 MG tablet Take 1 tablet (300 mg total) by mouth daily.   levETIRAcetam (KEPPRA) 500 MG tablet Take 1 tablet (500 mg total) by mouth 2 (two) times daily.   metFORMIN (GLUCOPHAGE-XR) 500 MG 24 hr tablet Take 500 mg by mouth 2 (two) times daily.   metoprolol tartrate (LOPRESSOR) 100 MG tablet Take 1 tablet (100 mg total) by mouth 2 (two) times daily.   Multiple Vitamin (MULTIVITAMIN WITH MINERALS) TABS tablet Take 1 tablet by mouth daily.   pantoprazole (PROTONIX) 40 MG tablet TAKE 1 TABLET (40 MG TOTAL) BY MOUTH DAILY.   rosuvastatin (CRESTOR) 10 MG tablet Take 1 tablet (10 mg total) by mouth daily.   sertraline (ZOLOFT) 25 MG tablet TAKE 1 TABLET (25 MG TOTAL) BY MOUTH DAILY.   zinc gluconate 50 MG tablet Take 50 mg by mouth daily.     Allergies:   Patient has no known allergies.   Social History   Socioeconomic History   Marital status: Significant Other    Spouse name: Not on file   Number of children: Not on file   Years of education: Not on file   Highest education level: Not on  file  Occupational History   Not on file  Tobacco Use   Smoking status: Never   Smokeless tobacco: Former    Types: Snuff  Vaping Use   Vaping Use: Never used  Substance and Sexual Activity   Alcohol use: Yes    Alcohol/week: 7.0 standard drinks    Types: 7 Shots of liquor per week   Drug use: No   Sexual activity: Yes  Other Topics Concern   Not on file  Social History Narrative   Lives with mom and girlfriend   Right Handed   Drinks caffeinated tea   Social Determinants of Health  Financial Resource Strain: Not on file  Food Insecurity: Not on file  Transportation Needs: Not on file  Physical Activity: Not on file  Stress: Not on file  Social Connections: Not on file     Family History: The patient's family history includes Anesthesia problems in his sister; Colon cancer in an other family member; Prostate cancer in an other family member.  ROS:   Please see the history of present illness.     All other systems reviewed and are negative.  EKGs/Labs/Other Studies Reviewed:    The following studies were reviewed today:  Echo 07/25/2020 1. Left ventricular ejection fraction, by estimation, is 60 to 65%. The  left ventricle has normal function. The left ventricle has no regional  wall motion abnormalities. There is moderate concentric left ventricular  hypertrophy. Left ventricular  diastolic parameters are indeterminate.   2. Right ventricular systolic function is normal. The right ventricular  size is normal.   3. The mitral valve is normal in structure. No evidence of mitral valve  regurgitation. No evidence of mitral stenosis.   4. The aortic valve is tricuspid. Aortic valve regurgitation is not  visualized. No aortic stenosis is present.   5. The inferior vena cava is dilated in size with >50% respiratory  variability, suggesting right atrial pressure of 8 mmHg.   Comparison(s): No significant change from prior study.   EKG:  EKG is not ordered today.     Recent Labs: 08/08/2020: Magnesium 1.9 08/16/2020: ALT 31 09/02/2020: Hemoglobin 12.0; Platelets 259 03/05/2021: BUN 15; Creatinine, Ser 1.09; Potassium 4.1; Sodium 139  Recent Lipid Panel    Component Value Date/Time   CHOL 93 07/25/2020 0515   TRIG 190 (H) 07/25/2020 0515   HDL 19 (L) 07/25/2020 0515   CHOLHDL 4.9 07/25/2020 0515   VLDL 38 07/25/2020 0515   LDLCALC 36 07/25/2020 0515     Risk Assessment/Calculations:           Physical Exam:    VS:  BP 115/71    Pulse (!) 51    Ht 6\' 1"  (1.854 m)    Wt (!) 314 lb 6.4 oz (142.6 kg)    SpO2 98%    BMI 41.48 kg/m     Wt Readings from Last 3 Encounters:  07/22/21 (!) 314 lb 6.4 oz (142.6 kg)  05/23/21 (!) 311 lb (141.1 kg)  05/20/21 (!) 315 lb (142.9 kg)     GEN:  Well nourished, well developed in no acute distress HEENT: Normal NECK: No JVD; No carotid bruits LYMPHATICS: No lymphadenopathy CARDIAC: RRR, no murmurs, rubs, gallops RESPIRATORY:  Clear to auscultation without rales, wheezing or rhonchi  ABDOMEN: Soft, non-tender, non-distended MUSCULOSKELETAL:  No edema; No deformity  SKIN: Warm and dry NEUROLOGIC:  Alert and oriented x 3 PSYCHIATRIC:  Normal affect   ASSESSMENT:    1. Essential hypertension   2. Medication management   3. Hyperlipidemia LDL goal <70   4. Controlled type 2 diabetes mellitus without complication, without long-term current use of insulin (HCC)   5. H/O: CVA (cerebrovascular accident)    PLAN:    In order of problems listed above:  Hypertension: Blood pressure well controlled on current therapy: On hydralazine, irbesartan and metoprolol  Medication management: Obtain CMP, CBC and lipid panel  Hyperlipidemia: Continue Crestor  DM2: Managed by primary care provider.  On glimepiride and metformin  History of CVA: Followed by Dr. 05/22/21.  Continue to have right upper extremity weakness.  Medication Adjustments/Labs and Tests Ordered: Current medicines are reviewed  at length with the patient today.  Concerns regarding medicines are outlined above.  Orders Placed This Encounter  Procedures   CBC   Comprehensive metabolic panel   Lipid panel   No orders of the defined types were placed in this encounter.   Patient Instructions  Medication Instructions:  Your physician recommends that you continue on your current medications as directed. Please refer to the Current Medication list given to you today.   *If you need a refill on your cardiac medications before your next appointment, please call your pharmacy*   Lab Work: Your physician recommends that you return for lab work in 1-2 months FASTING LIPID CMP CBC  If you have labs (blood work) drawn today and your tests are completely normal, you will receive your results only by: MyChart Message (if you have MyChart) OR A paper copy in the mail If you have any lab test that is abnormal or we need to change your treatment, we will call you to review the results.   Testing/Procedures: NONE ordered at this time of appointment     Follow-Up: At [facility], you and your health needs are our priority.  As part of our continuing mission to provide you with exceptional heart care, we have created designated Provider Care Teams.  These Care Teams include your primary Cardiologist (physician) and Advanced Practice Providers (APPs -  Physician Assistants and Nurse Practitioners) who all work together to provide you with the care you need, when you need it.  We recommend signing up for the patient portal called "MyChart".  Sign up information is provided on this After Visit Summary.  MyChart is used to connect with patients for Virtual Visits (Telemedicine).  Patients are able to view lab/test results, encounter notes, upcoming appointments, etc.  Non-urgent messages can be sent to your provider as well.   To learn more about what you can do with MyChart, go to [email or web address].    Your next  appointment:   6 month(s)  The format for your next appointment:   In Person  Provider:   Peter SwazilandJordan, MD     Other Instructions    Signed, Azalee CourseHao Camdin Hegner, PA  07/24/2021 11:15 PM    [facility]

## 2021-07-22 NOTE — Patient Instructions (Signed)
Medication Instructions:  Your physician recommends that you continue on your current medications as directed. Please refer to the Current Medication list given to you today.   *If you need a refill on your cardiac medications before your next appointment, please call your pharmacy*   Lab Work: Your physician recommends that you return for lab work in 1-2 months FASTING LIPID CMP CBC  If you have labs (blood work) drawn today and your tests are completely normal, you will receive your results only by: MyChart Message (if you have MyChart) OR A paper copy in the mail If you have any lab test that is abnormal or we need to change your treatment, we will call you to review the results.   Testing/Procedures: NONE ordered at this time of appointment     Follow-Up: At [facility], you and your health needs are our priority.  As part of our continuing mission to provide you with exceptional heart care, we have created designated Provider Care Teams.  These Care Teams include your primary Cardiologist (physician) and Advanced Practice Providers (APPs -  Physician Assistants and Nurse Practitioners) who all work together to provide you with the care you need, when you need it.  We recommend signing up for the patient portal called "MyChart".  Sign up information is provided on this After Visit Summary.  MyChart is used to connect with patients for Virtual Visits (Telemedicine).  Patients are able to view lab/test results, encounter notes, upcoming appointments, etc.  Non-urgent messages can be sent to your provider as well.   To learn more about what you can do with MyChart, go to [email or web address].    Your next appointment:   6 month(s)  The format for your next appointment:   In Person  Provider:   Peter Swazil[place], MD     Other Instructions

## 2021-07-23 ENCOUNTER — Ambulatory Visit: Payer: BC Managed Care – PPO | Admitting: Physical Therapy

## 2021-07-23 ENCOUNTER — Ambulatory Visit: Payer: BC Managed Care – PPO | Admitting: Speech Pathology

## 2021-07-23 ENCOUNTER — Ambulatory Visit: Payer: BC Managed Care – PPO | Admitting: Occupational Therapy

## 2021-07-24 ENCOUNTER — Encounter: Payer: Self-pay | Admitting: Physician Assistant

## 2021-07-25 ENCOUNTER — Ambulatory Visit: Payer: BC Managed Care – PPO | Admitting: Speech Pathology

## 2021-07-25 ENCOUNTER — Ambulatory Visit: Payer: BC Managed Care – PPO | Admitting: Physical Therapy

## 2021-07-25 ENCOUNTER — Ambulatory Visit: Payer: BC Managed Care – PPO | Admitting: Occupational Therapy

## 2021-07-29 ENCOUNTER — Ambulatory Visit: Payer: BC Managed Care – PPO | Admitting: Occupational Therapy

## 2021-07-29 ENCOUNTER — Other Ambulatory Visit: Payer: Self-pay

## 2021-07-29 ENCOUNTER — Ambulatory Visit: Payer: BC Managed Care – PPO

## 2021-07-29 ENCOUNTER — Ambulatory Visit: Payer: BC Managed Care – PPO | Attending: Family Medicine | Admitting: Physical Therapy

## 2021-07-29 DIAGNOSIS — I69318 Other symptoms and signs involving cognitive functions following cerebral infarction: Secondary | ICD-10-CM | POA: Insufficient documentation

## 2021-07-29 DIAGNOSIS — R278 Other lack of coordination: Secondary | ICD-10-CM

## 2021-07-29 DIAGNOSIS — R29898 Other symptoms and signs involving the musculoskeletal system: Secondary | ICD-10-CM | POA: Diagnosis present

## 2021-07-29 DIAGNOSIS — R4701 Aphasia: Secondary | ICD-10-CM | POA: Diagnosis present

## 2021-07-29 DIAGNOSIS — R2681 Unsteadiness on feet: Secondary | ICD-10-CM | POA: Insufficient documentation

## 2021-07-29 DIAGNOSIS — R2689 Other abnormalities of gait and mobility: Secondary | ICD-10-CM | POA: Diagnosis present

## 2021-07-29 DIAGNOSIS — I69351 Hemiplegia and hemiparesis following cerebral infarction affecting right dominant side: Secondary | ICD-10-CM | POA: Diagnosis present

## 2021-07-29 NOTE — Patient Instructions (Signed)
When you have trouble saying the word you want to say:  1)  Describe it! Describe the size, color, shape, function, composition (what it's made of), and/or location to be able to have the word come sooner, or to have your listener help you out  2) "talk around the word" (say it a totally different way) -get your point out using different words than the one/ones you can't think of  3) Use a synonym - think of another word that means the exact same thing  4) DRAW! You can draw some things you want to say in order to give your listener a hint about what you're talking about  5)  Gesture- make motions to help your listener understand what you are trying to communicate  6) Write down the word, if you can - or the first letter or letters, to help you say the word or to give your listener a hint about what you're trying to say      Name 10 tools you use         Name 10 pieces of farm equipment

## 2021-07-29 NOTE — Therapy (Addendum)
[facility] [address] [place], [place], [address] Phone: [phone]   Fax:  [phone]  Occupational Therapy Treatment  Patient Details  Name: Justin Mason MRN: [id number] Date of Birth: March 07, 1981 Referring Provider (OT): Ihor AustinJessica McCue, NP   Encounter Date: 07/29/2021   OT End of Session - 07/29/21 1111     Visit Number 2    Number of Visits 25    Date for OT Re-Evaluation 09/14/21    Authorization Type [employer] - $40 copay per discipline. visit limit: 30 total visits for all therapies combined    OT Start Time 1015    OT Stop Time 1100    OT Time Calculation (min) 45 min    Activity Tolerance Patient tolerated treatment well    Behavior During Therapy Flat affect             Past Medical History:  Diagnosis Date   AKI (acute kidney injury) (HCC)    Arthritis    Cardiac murmur 05/24/2020   Cellulitis 01/03/2015   Cellulitis of right leg    Cellulitis of right lower extremity 01/03/2015   Essential hypertension    GERD (gastroesophageal reflux disease)    High cholesterol    Hyperlipemia    Hypertension    Morbid obesity (HCC) 05/24/2020   OSA (obstructive sleep apnea)    Rotator cuff tear, right 09/08/2011   Sepsis (HCC) 01/03/2015   Stroke (HCC)    Type 2 diabetes mellitus with hyperglycemia (HCC)     Past Surgical History:  Procedure Laterality Date   INCISION AND DRAINAGE FOOT  1991-1992   STEPPED ON NAIL / INFECTED WOUND   IR ANGIO INTRA EXTRACRAN SEL COM CAROTID INNOMINATE BILAT MOD SED  07/24/2020   IR ANGIO VERTEBRAL SEL SUBCLAVIAN INNOMINATE UNI L MOD SED  07/24/2020   IR ANGIO VERTEBRAL SEL VERTEBRAL UNI R MOD SED  07/24/2020   RADIOLOGY WITH ANESTHESIA N/A 07/24/2020   Procedure: IR WITH ANESTHESIA - CODE STROKE;  Surgeon: Radiologist, Medication, MD;  Location: [facility] OR;  Service: Radiology;  Laterality: N/A;   SHOULDER SURGERY     September 08, 2011    There were no vitals filed  for this visit.   Subjective Assessment - 07/29/21 1020     Subjective  Pt reports that he can't use R hand    Patient is accompanied by: Family member   mother--Justin Mason   Pertinent History Spastic hemiplegia of R side as late effect of cerebral infarction 07/24/20.     PMH:  obesity, HTN, hyperlipidemia, arthritis, DM, OSA, hx of R RTC tear 09/2011, possible recent seizure    Limitations possible seizure hx    Patient Stated Goals be able to use R hand    Currently in Pain? No/denies             Initiated HEP for neuro re-education RUE - see pt instructions for details. Pt overall required min cueing. Wife also videotaped ex's for greater carryover.   Noted pt w/ decreased thumb movement and sits in adduction, and long finger PIP hyperextension - pt would benefit from splints for this.                      OT Education - 07/29/21 1057     Education Details HEP    Person(s) Educated Patient    Methods Explanation;Demonstration;Verbal cues;Handout   mother also videotaped exercises   Comprehension Verbalized understanding;Returned demonstration;Verbal cues required  OT Short Term Goals - 07/29/21 1115       OT SHORT TERM GOAL #1   Title Pt will be independent with initial HEP.--check STGs 07/17/21    Time 4    Period Weeks    Status On-going      OT SHORT TERM GOAL #2   Title Pt will be independent with splint wear/care for improved R hand positioning.    Time 4    Period Weeks    Status New      OT SHORT TERM GOAL #3   Title Pt demo at least 30* active R shoulder flex for functional reach.    Time 4    Period Weeks    Status New      OT SHORT TERM GOAL #4   Title Pt will be able to consistently use RUE as stabilizer/gross assist for simple tasks.    Time 4    Period Weeks    Status New      OT SHORT TERM GOAL #5   Title Pt will be able to perform low-level reach to grasp/release small cylinder object without assist.    Time 4     Period Weeks    Status New               OT Long Term Goals - 06/16/21 1520       OT LONG TERM GOAL #1   Title Pt will be independent with updated HEP.--check LTGs 09/14/21    Time 12    Period Weeks    Status New      OT LONG TERM GOAL #2   Title Pt will perform BADLs mod I.    Time 12    Period Weeks    Status New      OT LONG TERM GOAL #3   Title Pt will be able to perform simple snack prep mod I.    Time 12    Period Weeks    Status New      OT LONG TERM GOAL #4   Title Pt will perform simple home maintenance task mod I.    Time 12    Period Weeks    Status New      OT LONG TERM GOAL #5   Title Pt will demo at least 45* R shoulder flex for active reach.    Time 12    Period Weeks    Status New      OT LONG TERM GOAL #6   Title Pt will improve RUE reach/coordination for ADLs as shown by improve score on box and blocks test to 8.    Baseline R-1 lbock    Time 12    Period Weeks    Status New                   Plan - 07/29/21 1115     Clinical Impression Statement Initiated HEP today for neuro re-education RUE - pt tolerated well w/ initial cues    OT Occupational Profile and History Detailed Assessment- Review of Records and additional review of physical, cognitive, psychosocial history related to current functional performance    Occupational performance deficits (Please refer to evaluation for details): ADL's;IADL's;Work;Leisure    Body Structure / Function / Physical Skills ADL;Decreased knowledge of use of DME;Strength;Tone;GMC;Dexterity;Balance;Body mechanics;UE functional use;ROM;IADL;Mobility;Coordination;FMC    Cognitive Skills Memory;Understand    Rehab Potential Good    Clinical Decision Making Several treatment options, min-mod task modification necessary  Comorbidities Affecting Occupational Performance: Presence of comorbidities impacting occupational performance    Comorbidities impacting occupational performance description: hx  or R RTC tear with prior limitations, possible seizure hx, arthritis, obesity    Modification or Assistance to Complete Evaluation  Min-Moderate modification of tasks or assist with assess necessary to complete eval    OT Frequency 2x / week    OT Duration 12 weeks   +eval   OT Treatment/Interventions Self-care/ADL training;Moist Heat;DME and/or AE instruction;Fluidtherapy;Splinting;Balance training;Therapeutic activities;Ultrasound;Therapeutic exercise;Cognitive remediation/compensation;Passive range of motion;Functional Mobility Training;Neuromuscular education;Cryotherapy;Electrical Stimulation;Energy conservation;Manual Therapy;Paraffin;Patient/family education    Plan discuss how he wants to use visits as he has 30 visit limit for all therapies, review HEP, fabricate thumb opponens splint, Oval 8 finger splint to prevent long finger PIP hyperextension    Consulted and Agree with Plan of Care Patient;Family member/caregiver    Family Member Consulted mother             Patient will benefit from skilled therapeutic intervention in order to improve the following deficits and impairments:   Body Structure / Function / Physical Skills: ADL, Decreased knowledge of use of DME, Strength, Tone, GMC, Dexterity, Balance, Body mechanics, UE functional use, ROM, IADL, Mobility, Coordination, [facility] Cognitive Skills: Memory, Understand     Visit Diagnosis: Hemiplegia and hemiparesis following cerebral infarction affecting right dominant side (HCC)  Other lack of coordination    Problem List Patient Active Problem List   Diagnosis Date Noted   Primary osteoarthritis of left knee 11/07/2020   Gout flare 09/06/2020   Type 2 diabetes mellitus (HCC) 09/06/2020   Chronic pain of left knee    Expressive aphasia    Chronic pain of right knee    Right elbow pain    Acute blood loss anemia    Right hemiparesis (HCC)    Right hemiplegia (HCC)    Hyponatremia    AKI (acute kidney injury) (HCC)     Benign essential HTN    Pressure injury of skin 08/09/2020   Dyslipidemia    Controlled type 2 diabetes mellitus with hyperglycemia, without long-term current use of insulin (HCC)    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   Middle cerebral artery stenosis, 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   Morbid obesity (HCC) 05/24/2020   Cardiac murmur 05/24/2020   Arthritis    High cholesterol    Hyperlipemia    Type 2 diabetes mellitus with hyperglycemia (HCC)    Cellulitis 01/03/2015   Cellulitis of right lower extremity 01/03/2015   Sepsis (HCC) 01/03/2015   Hypertension    GERD (gastroesophageal reflux disease)    Essential hypertension    Cellulitis of right leg    Rotator cuff tear, right 09/08/2011    Kelli Churn, OTR/L 07/29/2021, 11:24 AM  [facility] [address] [place], [place], [address] Phone: [phone]   Fax:  [phone]  Name: AARIB PULIDO MRN: [id number] Date of Birth: Nov 24, 1980

## 2021-07-29 NOTE — Therapy (Signed)
[facility] [address] [place], [place], [address] Phone: [phone]   Fax:  [phone]  Speech Language Pathology Treatment  Patient Details  Name: Justin Mason MRN: [id number] Date of Birth: Oct 28, 1980 Referring Provider (SLP): Ihor Austin NP   Encounter Date: 07/29/2021   End of Session - 07/29/21 0934     Visit Number 2    Number of Visits 25    Date for SLP Re-Evaluation 09/12/21    Authorization Type [employer] - 30 visit combined limit; $40 copay per discipline    SLP Start Time [phone]   pt arrived late   SLP Stop Time  1015    SLP Time Calculation (min) 41 min    Activity Tolerance Patient tolerated treatment well             Past Medical History:  Diagnosis Date   AKI (acute kidney injury) (HCC)    Arthritis    Cardiac murmur 05/24/2020   Cellulitis 01/03/2015   Cellulitis of right leg    Cellulitis of right lower extremity 01/03/2015   Essential hypertension    GERD (gastroesophageal reflux disease)    High cholesterol    Hyperlipemia    Hypertension    Morbid obesity (HCC) 05/24/2020   OSA (obstructive sleep apnea)    Rotator cuff tear, right 09/08/2011   Sepsis (HCC) 01/03/2015   Stroke (HCC)    Type 2 diabetes mellitus with hyperglycemia (HCC)     Past Surgical History:  Procedure Laterality Date   INCISION AND DRAINAGE FOOT  1991-1992   STEPPED ON NAIL / INFECTED WOUND   IR ANGIO INTRA EXTRACRAN SEL COM CAROTID INNOMINATE BILAT MOD SED  07/24/2020   IR ANGIO VERTEBRAL SEL SUBCLAVIAN INNOMINATE UNI L MOD SED  07/24/2020   IR ANGIO VERTEBRAL SEL VERTEBRAL UNI R MOD SED  07/24/2020   RADIOLOGY WITH ANESTHESIA N/A 07/24/2020   Procedure: IR WITH ANESTHESIA - CODE STROKE;  Surgeon: Radiologist, Medication, MD;  Location: [facility] OR;  Service: Radiology;  Laterality: N/A;   SHOULDER SURGERY     September 08, 2011    There were no vitals filed for this visit.   Subjective Assessment - 07/29/21  0934     Subjective "pretty good"    Currently in Pain? No/denies                   ADULT SLP TREATMENT - 07/29/21 0933       General Information   Behavior/Cognition Alert;Cooperative;Pleasant mood      Treatment Provided   Treatment provided Cognitive-Linquistic      Cognitive-Linquistic Treatment   Treatment focused on Aphasia    Skilled Treatment Liam was accompanied by his mother, [name]. [name] provided updated insurance information with therapist including limited therapy visits (30 total) and high co-pay per each discipline. [name] expressed concern for finances. Given pt returned nearly one month after therapy due to awaiting insurance verification, SLP briefly reassessed verbal expression and usage of compensations. Pt exhibited adequate simple convergent naming in room, sentence completions, and naming based on simple descriptions. In short structured conversations, pt required intermittent to usual prompting from this mother to expand verbal output and aid anomia. Occasional phonemic paraphasias/possible apraxic errors exhibited in conversation, in which pt was largely unaware. SLP provided education re: recommended anomia strategies and intermittently trialed and cued use of writing, gestures, and descriptions. Writing was occasionally effective for simple words. Gestures/drawing not implemented when cued. Mother often prompts key  word at home which has been helpful. Naming HEP initiated. Family will decide how to manage upcoming visits due to new financial constraints.      Assessment / Recommendations / Plan   Plan Continue with current plan of care      Progression Toward Goals   Progression toward goals Progressing toward goals              SLP Education - 07/29/21 1212     Education Details anomia compensations, naming HEP    Person(s) Educated Patient;Parent(s)    Methods Explanation;Demonstration;Verbal cues;Handout    Comprehension Verbalized  understanding;Returned demonstration;Need further instruction              SLP Short Term Goals - 07/29/21 0934       SLP SHORT TERM GOAL #1   Title Pt will name 10 items in personally relevant category with rare min A over 2 sessions    Time 4    Period Weeks    Status On-going      SLP SHORT TERM GOAL #2   Title Pt will generate simple sentences in structured tasks with rare min A 9/10 opportunitites    Time 4    Period Weeks    Status On-going      SLP SHORT TERM GOAL #3   Title Pt and family will implement 2 multimodal communication strategies to  support pt's auditory comprehension to report 25% subjectively reduced frustration with communication at home.    Time 4    Period Weeks    Status On-going      SLP SHORT TERM GOAL #4   Title Pt will ID and self correct errors in text or written communication 8/10x with usual min A    Time 4    Period Weeks    Status On-going      SLP SHORT TERM GOAL #5   Title Pt will use verbal or gestural compensations for aphasia as needed 8/10x on structured naming tasks with occasional min A    Time 4    Period Weeks    Status On-going              SLP Long Term Goals - 07/29/21 0934       SLP LONG TERM GOAL #1   Title Pt will comprehend 4 turns in simple conversation with no requests for repetition over 3 sessions    Time 12    Period Weeks    Status On-going      SLP LONG TERM GOAL #2   Title In 5 minute simple conversation, Jonny RuizJohn will successfully use multimodal compensations for aphasia as needed 3/5x with occasional min A    Time 12    Period Weeks    Status On-going      SLP LONG TERM GOAL #3   Title Pt will write complex sentences in structured task, such as VNeST, ID and self correct 8/10 errors with occasional min A    Time 12    Period Weeks    Status On-going      SLP LONG TERM GOAL #4   Title Pt will improve score on Cognitive Participation Bank - Short Form, by 3 points (original score 4/30)    Time  12    Period Weeks    Status On-going              Plan - 07/29/21 1213     Clinical Impression Statement Donell SievertJohn Trieu is referred b neurology due  to 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 He is accompanied by his mother, [name]. Prior to CVA, Dondrell was independent in IADL's and worked as a Academic librarian, Data processing manager. They both report ongoing frustration with communciation. 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 Pt often relied on mother for prompting to expand his verbal expression. SLP initiated education and training for anomia compensations and HEP to implement at home. Due to new insurance, family may proceed to limit visits due to financial constraints. I recommend skilled ST to maximize communication for safety, reduce caregiver burden and improve life participation.    Speech Therapy Frequency 2x / week    Duration 12 weeks    Treatment/Interventions Language facilitation;Environmental controls;Cueing hierarchy;SLP instruction and feedback;Compensatory strategies;Functional tasks;Cognitive reorganization;Compensatory techniques;Patient/family education;Multimodal communcation approach;Internal/external aids    Potential to Achieve Goals Fair    Potential Considerations Severity of impairments             Patient will benefit from skilled therapeutic intervention in order to improve the following deficits and impairments:   Aphasia    Problem List Patient Active Problem List   Diagnosis Date Noted   Primary osteoarthritis of left knee 11/07/2020   Gout flare 09/06/2020   Type 2 diabetes mellitus (HCC) 09/06/2020   Chronic pain of left knee    Expressive aphasia    Chronic pain of right knee    Right elbow pain    Acute blood loss anemia    Right hemiparesis (HCC)    Right hemiplegia (HCC)    Hyponatremia     AKI (acute kidney injury) (HCC)    Benign essential HTN    Pressure injury of skin 08/09/2020   Dyslipidemia    Controlled type 2 diabetes mellitus with hyperglycemia, without long-term current use of insulin (HCC)    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   Middle cerebral artery stenosis, 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 [place], [place], [address] Phone: [phone]   Fax:  [phone]   Name: LAKOTA MARKGRAF MRN: [id number] Date of Birth: [birth year]

## 2021-07-29 NOTE — Patient Instructions (Signed)
°  Functional Quadriceps: Sit to Stand    Sit on edge of chair, feet flat on floor. Stand upright, extending knees fully. Repeat __10__ times per set. Do __1__ sets per session. Do __1__ sessions per day.       Standing Marching   Using a chair if necessary, march in place. Repeat 10 times. Do 1 sessions per day.     Hip Backward Kick   Using a chair for balance, keep legs shoulder width apart and toes pointed for- ward. Slowly extend one leg back, keeping knee straight. Do not lean forward. Repeat with other leg. Repeat 10 times. Do 1 sessions per day.     Hip Side Kick   Holding a chair for balance, keep legs shoulder width apart and toes pointed forward. Swing a leg out to side, keeping knee straight. Do not lean. Repeat using other leg. Repeat 10 times. Do 1 sessions per day.  ALSO DO FORWARD KICKS - ALTERNATING LEGS - 10 TIMES EACH - AT COUNTER   Feet Heel-Toe "Tandem", Varied Arm Positions - Eyes Open - PARTIAL HEEL TO TOE   With eyes open, right foot directly in front of the other, arms out, look straight ahead at a stationary object. Hold 30  seconds. Repeat 2 times per session. Do 1 sessions per day.    Standing On One Leg Without Support .  Stand on one leg in neutral spine without support. Hold  10 seconds. Repeat on other leg. Do 2 repetitions, 1 sets.    Side-Stepping   Walk to left side with eyes open. Take even steps, leading with same foot. Make sure each foot lifts off the floor. Repeat in opposite direction. Repeat for 2-3 reps along counter.  Do 1 sessions per day

## 2021-07-29 NOTE — Patient Instructions (Signed)
°  1) Flexion (Assistive)    LAYING DOWN: clasp Rt wrist (thumb side up) with left hand and raise arms above head, keeping elbows as straight as possible.  Repeat __10__ times. Do __2__ sessions per day.   2. ELBOW: Extension / Chest Press (Frame)    Lie on back with knees bent. Straighten elbows to raise frame or pvc pipe. Hold _3__ seconds. Repeat _10__ reps per set, _2__ sets per day.   3. SELF ASSISTED WITH OBJECT: Shoulder Flexion / Elbow Extension (Frame)    Keep trunk straight, bend and straighten elbows to move quad cane backward and forward (keep outer side from coming up) _10__ reps per set, _2__ sets per day  4. Seated: hold pool noodle (2.5 ft long) in both hands and slide off lap towards floor slowly then back to lap. Repeat 10 times (refer to video)  5. Flexion (Passive)    Sitting upright with folded hand towel under Rt hand, slide forearm forward along table with help from other hand, bending from the waist until a stretch is felt. Hold __5__ seconds.  Repeat _10___ times. Do __2-3__ sessions per day.  6. Turn palm up and down x 10 reps. Keep elbow bent 7. Open and close hand fully and slowly x 10 reps.  8. Try and pick up light weight plastic objects from low level while seated (cones, plastic cups, etc) Rt hand and place to side and then back to low surface

## 2021-07-30 NOTE — Therapy (Signed)
[facility] [address] [place], [place], [address] Phone: [phone]   Fax:  [phone]  Physical Therapy Treatment  Patient Details  Name: Justin Mason MRN: [id number] Date of Birth: January 02, 1981 Referring Provider (PT): Frann Rider, NP   Encounter Date: 07/29/2021   PT End of Session - 07/30/21 1412     Visit Number 2   visit 1 in 2023   Number of Visits 17    Date for PT Re-Evaluation 08/15/21    Authorization Type BCBS    PT Start Time 1104    PT Stop Time 1145    PT Time Calculation (min) 41 min    Activity Tolerance Patient tolerated treatment well    Behavior During Therapy --   occasional frustration due to communication deficits due to aphasia            Past Medical History:  Diagnosis Date   AKI (acute kidney injury) ([place])    Arthritis    Cardiac murmur 05/24/2020   Cellulitis 01/03/2015   Cellulitis of right leg    Cellulitis of right lower extremity 01/03/2015   Essential hypertension    GERD (gastroesophageal reflux disease)    High cholesterol    Hyperlipemia    Hypertension    Morbid obesity ([place]) 05/24/2020   OSA (obstructive sleep apnea)    Rotator cuff tear, right 09/08/2011   Sepsis ([place]) 01/03/2015   Stroke ([place])    Type 2 diabetes mellitus with hyperglycemia ([place])     Past Surgical History:  Procedure Laterality Date   INCISION AND DRAINAGE FOOT  1991-1992   STEPPED ON NAIL / INFECTED WOUND   IR ANGIO INTRA EXTRACRAN SEL COM CAROTID INNOMINATE BILAT MOD SED  07/24/2020   IR ANGIO VERTEBRAL SEL SUBCLAVIAN INNOMINATE UNI L MOD SED  07/24/2020   IR ANGIO VERTEBRAL SEL VERTEBRAL UNI R MOD SED  07/24/2020   RADIOLOGY WITH ANESTHESIA N/A 07/24/2020   Procedure: IR WITH ANESTHESIA - CODE STROKE;  Surgeon: Radiologist, Medication, MD;  Location: [facility];  Service: Radiology;  Laterality: N/A;   SHOULDER SURGERY     September 08, 2011    There were no vitals filed for this visit.    Subjective Assessment - 07/30/21 1402     Subjective Mother states insurance has changed -has 30 visit limit - wants to know how we think the visits should be allocated and spaced out to maximize time.  She reports she will work with Jenny Reichmann as much as possible at home in doing exercises.    Patient is accompained by: Family member   mother [name]   Pertinent History HTN, morbid obesity, OSA, Lt CVA on 07-24-20, acute kidney injury, expressive aphasia, chronic pain bil. knees, type 2 DM, h/o Rt rotator cuff sx 09/2011, possible recent siezure    Patient Stated Goals walk with the cane - not have to use the rollator    Currently in Pain? No/denies                               [facility] Adult PT Treatment/Exercise - 07/30/21 0001       Transfers   Transfers Sit to Stand;Stand to Sit    Sit to Stand 5: Supervision    Comments needs LUE support due to Lt ACL tear - pt reports some discomfort in knee with sit to stand      Ambulation/Gait   Ambulation/Gait  Yes    Ambulation/Gait Assistance 5: Supervision    Ambulation Distance (Feet) 115 Feet    Assistive device Small based quad cane    Gait Pattern Step-through pattern    Ambulation Surface Level;Indoor    Stairs Yes    Stairs Assistance 5: Supervision    Stairs Assistance Details (indicate cue type and reason) pt leads with LLE for both ascension & descension    Stair Management Technique Forwards;Step to pattern;One rail Right;With cane    Number of Stairs 4    Height of Stairs 6                 Balance Exercises - 07/30/21 0001       Balance Exercises: Standing   Partial Tandem Stance Eyes open;2 reps;30 secs    Marching Solid surface;Static;10 reps    Other Standing Exercises Pt performed standing balance exs at counter for HEP - mother videotaped exs for further understanding for HEP:  pt performed forward, back and side kicks 10 reps each; marching in place 10 reps each; standing partial heel to toe each  position 30 secs with UE support prn:  SLS on each leg 10 sec hold; side stepping along counter; pt performed crossovers front 2 reps along counter but was not issued this ex. for HEP                PT Education - 07/30/21 1410     Education Details balance HEP - mother videotaped exs with her phone; see pt instructions; discussed allocation of visits with pt informed that PT would not likely need 10 - that some of these could go to ST or OT as he desired    Person(s) Educated Parent(s);Patient    Methods Explanation;Demonstration;Handout    Comprehension Verbalized understanding;Returned demonstration              PT Short Term Goals - 07/30/21 1412       PT SHORT TERM GOAL #1   Title Pt will amb. 230' with SBQC with CGA on flat, even surface for increased community accessibility.    Baseline 115' with SBQC on 07-29-21    Time 4    Period Weeks    Status Partially Met    Target Date 07/18/21      PT SHORT TERM GOAL #2   Title Increase gait velocity from 1.52 ft/sec to >/= 1.9 ft/sec with use of rollator for increased gait efficiency.    Baseline 21.62 secs = 1.52 ft/sec with rollator;    Time 4    Period Weeks    Status On-going    Target Date 07/18/21      PT SHORT TERM GOAL #3   Title Improve TUG score from 31.19 secs with rollator to </= 26 secs with rollator for reduced fall risk.    Baseline 33.75    Time 4    Period Weeks    Status On-going    Target Date 07/18/21      PT SHORT TERM GOAL #4   Title Pt will participate in [place] assessment.    Baseline due to limited visits - time was focused on HEP instruction    Time 4    Period Weeks    Status Deferred    Target Date 07/18/21      PT SHORT TERM GOAL #5   Title Negotiate steps with use of 1 handrail and SBQC with min to CGA.    Baseline met 07-29-21  Time 4    Period Weeks    Status Achieved    Target Date 07/18/21               PT Long Term Goals - 07/30/21 1414       PT LONG TERM  GOAL #1   Title Pt will be modified independent without device for household ambulation.    Time 8    Period Weeks    Status New    Target Date 08/15/21      PT LONG TERM GOAL #2   Title Pt will amb. 500' with SBQC on outdoor paved surfaces with supervision for increased community accessibiity.    Time 8    Period Weeks    Status New    Target Date 08/15/21      PT LONG TERM GOAL #3   Title Negotiate 4 steps with quad cane without use of hand rail with supervision.    Time 8    Period Weeks    Status New    Target Date 08/15/21      PT LONG TERM GOAL #4   Title Improve TUG score to </= 23 secs with SBQC for reduced fall risk.    Baseline 31.19 secs with rollator on 06-16-21    Time 8    Period Weeks    Status New    Target Date 08/15/21      PT LONG TERM GOAL #5   Title Improve Berg score by at least 8 points to reduce fall risk.    Time 8    Period Weeks    Status New    Target Date 08/15/21      PT LONG TERM GOAL #6   Title Independent in HEP for balance and strengthening exs.    Time 8    Period Weeks    Status New    Target Date 08/15/21                   Plan - 07/30/21 1415     Clinical Impression Statement Pt returns for first tx session since eval 1 month ago.  Mother reports insurance has changed and pt only has 30 visits total for all disciplines.  Pt has met STG #5 with other goals ongoing as partially met.  Berg test is deferred due to limited visits with focus on HEP instruction.  Pt is able to amb. short distances (100') with SBQC and continues to use rollator for assistance with community ambulation.  Cont with POC.    Personal Factors and Comorbidities Comorbidity 2;Time since onset of injury/illness/exacerbation;Past/Current Experience;Fitness;Profession    Comorbidities HTN, morbid obesity, OSA, Lt CVA with Rt spastic hemiplegia 07-24-20, espressive aphasia, chronic pain bil. knees, type 2 DM, h/o Rt rotator cuff repair 09/2011, possible  recent seizure    Examination-Activity Limitations Bed Mobility;Locomotion Level;Transfers;Squat;Bend;Stairs;Toileting    Examination-Participation Restrictions Occupation;Community Activity;Interpersonal Relationship;Laundry;Shop;Meal Prep;Yard Work;Cleaning    Stability/Clinical Decision Making Evolving/Moderate complexity    Rehab Potential Good    PT Frequency 1x / week    PT Duration 8 weeks    PT Treatment/Interventions ADLs/Self Care Home Management;DME Instruction;Gait training;Stair training;Therapeutic activities;Therapeutic exercise;Balance training;Neuromuscular re-education;Patient/family education    PT Next Visit Plan Check HEP  - standing balance & sit to stand; step training if time allows    PT Home Exercise Plan see pt instructions - Balance HEP    Consulted and Agree with Plan of Care Patient;Family member/caregiver    Family Member Consulted  mother [name]             Patient will benefit from skilled therapeutic intervention in order to improve the following deficits and impairments:  Abnormal gait, Decreased balance, Decreased activity tolerance, Decreased coordination, Decreased strength, Impaired tone, Impaired UE functional use, Obesity  Visit Diagnosis: Other abnormalities of gait and mobility  Unsteadiness on feet  Spastic hemiplegia of right dominant side as late effect of cerebral infarction [facility])     Problem List Patient Active Problem List   Diagnosis Date Noted   Primary osteoarthritis of left knee 11/07/2020   Gout flare 09/06/2020   Type 2 diabetes mellitus ([place]) 09/06/2020   Chronic pain of left knee    Expressive aphasia    Chronic pain of right knee    Right elbow pain    Acute blood loss anemia    Right hemiparesis ([facility])    Right hemiplegia (HCC)    Hyponatremia    AKI (acute kidney injury) ([place])    Benign essential HTN    Pressure injury of skin 08/09/2020   Dyslipidemia    Controlled type 2 diabetes mellitus with hyperglycemia,  without long-term current use of insulin ([place])    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/2020   Middle cerebral artery stenosis, 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   Morbid obesity ([place]) 05/24/2020   Cardiac murmur 05/24/2020   Arthritis    High cholesterol    Hyperlipemia    Type 2 diabetes mellitus with hyperglycemia ([place])    Cellulitis 01/03/2015   Cellulitis of right lower extremity 01/03/2015   Sepsis ([place]) 01/03/2015   Hypertension    GERD (gastroesophageal reflux disease)    Essential hypertension    Cellulitis of right leg    Rotator cuff tear, right 09/08/2011    Claudell Rhody, [place], PT 07/30/2021, 2:23 PM  [facility] [address] [place], [place], [address] Phone: [phone]   Fax:  [phone]  Name: RAJVEER HANDLER MRN: [id number] Date of Birth: [birth year]

## 2021-07-31 ENCOUNTER — Other Ambulatory Visit: Payer: Self-pay

## 2021-07-31 ENCOUNTER — Encounter: Payer: Self-pay | Admitting: Occupational Therapy

## 2021-07-31 ENCOUNTER — Ambulatory Visit: Payer: BC Managed Care – PPO | Admitting: Occupational Therapy

## 2021-07-31 ENCOUNTER — Ambulatory Visit: Payer: BC Managed Care – PPO | Admitting: Physical Therapy

## 2021-07-31 ENCOUNTER — Ambulatory Visit: Payer: BC Managed Care – PPO | Admitting: Speech Pathology

## 2021-07-31 DIAGNOSIS — R4701 Aphasia: Secondary | ICD-10-CM

## 2021-07-31 DIAGNOSIS — I69351 Hemiplegia and hemiparesis following cerebral infarction affecting right dominant side: Secondary | ICD-10-CM

## 2021-07-31 DIAGNOSIS — R2681 Unsteadiness on feet: Secondary | ICD-10-CM

## 2021-07-31 DIAGNOSIS — R2689 Other abnormalities of gait and mobility: Secondary | ICD-10-CM | POA: Diagnosis not present

## 2021-07-31 DIAGNOSIS — R278 Other lack of coordination: Secondary | ICD-10-CM

## 2021-07-31 DIAGNOSIS — R29898 Other symptoms and signs involving the musculoskeletal system: Secondary | ICD-10-CM

## 2021-07-31 NOTE — Therapy (Signed)
[facility] [address] [place], [place], [address] Phone: [phone]   Fax:  [phone]  Occupational Therapy Treatment  Patient Details  Name: Justin Mason MRN: [id number] Date of Birth: 04/27/1981 Referring Provider (OT): Ihor Austin, NP   Encounter Date: 07/31/2021   OT End of Session - 07/31/21 1115     Visit Number 3    Number of Visits 25    Date for OT Re-Evaluation 09/14/21    Authorization Type [employer] - $40 copay per discipline. visit limit: 30 total visits for all therapies combined    OT Start Time 1015    OT Stop Time 1100    OT Time Calculation (min) 45 min    Activity Tolerance Patient tolerated treatment well    Behavior During Therapy WFL for tasks assessed/performed             Past Medical History:  Diagnosis Date   AKI (acute kidney injury) ([facility])    Arthritis    Cardiac murmur 05/24/2020   Cellulitis 01/03/2015   Cellulitis of right leg    Cellulitis of right lower extremity 01/03/2015   Essential hypertension    GERD (gastroesophageal reflux disease)    High cholesterol    Hyperlipemia    Hypertension    Morbid obesity (H[facility]) 05/24/2020   OSA (obstructive sleep apnea)    Rotator cuff tear, right 09/08/2011   Sepsis (HCC) 01/03/2015   Stroke (H[facility])    Type 2 diabetes mellitus with hyperglycemia (HCC)     Past Surgical History:  Procedure Laterality Date   INCISION AND DRAINAGE FOOT  1991-1992   STEPPED ON NAIL / INFECTED WOUND   IR ANGIO INTRA EXTRACRAN SEL COM CAROTID INNOMINATE BILAT MOD SED  07/24/2020   IR ANGIO VERTEBRAL SEL SUBCLAVIAN INNOMINATE UNI L MOD SED  07/24/2020   IR ANGIO VERTEBRAL SEL VERTEBRAL UNI R MOD SED  07/24/2020   RADIOLOGY WITH ANESTHESIA N/A 07/24/2020   Procedure: IR WITH ANESTHESIA - CODE STROKE;  Surgeon: Radiologist, Medication, MD;  [facility];  Service: Radiology;  Laterality: N/A;   SHOULDER SURGERY     September 08, 2011    There  were no vitals filed for this visit.   Subjective Assessment - 07/31/21 1108     Subjective  Patient's mom indicates that she video'd his exercises from last OT session and they have been doing them at home.  She indicated - "he remembers them better than me!"    Patient is accompanied by: Family member    Pertinent History Spastic hemiplegia of R side as late effect of cerebral infarction 07/24/20.     PMH:  obesity, HTN, hyperlipidemia, arthritis, DM, OSA, hx of R RTC tear 09/2011, possible recent seizure    Limitations possible seizure hx    Patient Stated Goals be able to use R hand    Currently in Pain? No/denies                          OT Treatments/Exercises (OP) - 07/31/21 0001       Splinting   Splinting Fabricated thumb opponens - hand based splint to include IP joint.  Working to encourage arch of hand as well as more functional position of thumb. Without splint, Thumb rests in plane of hand, and hand reverses arch with attempts to move.  Attempted oval 8 splint to address PIP hyperextension in long finger - but did  not yet issue this.  Patient also with limited IP flexion in index finger - which per mom is a longstanding problem - neither were sure why.  (Not related to stroke) Patient and mom instructed to wear the opponens splint at home for one hour and then remove and look for any areas indicating pressure or skin irritation. If these occur patient to bring back splint so we can modify.  Patient able to don and doff splint with increased time.   Patient able to pick up 1 inch lightweight objects on table with opponens splint in place.                    OT Education - 07/31/21 1113     Education Details splint wear and care    Person(s) Educated Patient;Parent(s)    Methods Explanation;Demonstration;Tactile cues    Comprehension Verbalized understanding;Returned demonstration              OT Short Term Goals - 07/31/21 1120       OT SHORT  TERM GOAL #1   Title Pt will be independent with initial HEP.--check STGs 07/17/21    Time 4    Period Weeks    Status On-going      OT SHORT TERM GOAL #2   Title Pt will be independent with splint wear/care for improved R hand positioning.    Time 4    Period Weeks    Status New      OT SHORT TERM GOAL #3   Title Pt demo at least 30* active R shoulder flex for functional reach.    Time 4    Period Weeks    Status New      OT SHORT TERM GOAL #4   Title Pt will be able to consistently use RUE as stabilizer/gross assist for simple tasks.    Time 4    Period Weeks    Status New      OT SHORT TERM GOAL #5   Title Pt will be able to perform low-level reach to grasp/release small cylinder object without assist.    Time 4    Period Weeks    Status New               OT Long Term Goals - 07/31/21 1120       OT LONG TERM GOAL #1   Title Pt will be independent with updated HEP.--check LTGs 09/14/21    Time 12    Period Weeks    Status New      OT LONG TERM GOAL #2   Title Pt will perform BADLs mod I.    Time 12    Period Weeks    Status New      OT LONG TERM GOAL #3   Title Pt will be able to perform simple snack prep mod I.    Time 12    Period Weeks    Status New      OT LONG TERM GOAL #4   Title Pt will perform simple home maintenance task mod I.    Time 12    Period Weeks    Status New      OT LONG TERM GOAL #5   Title Pt will demo at least 45* R shoulder flex for active reach.    Time 12    Period Weeks    Status New      OT LONG TERM GOAL #6   Title Pt  will improve RUE reach/coordination for ADLs as shown by improve score on box and blocks test to 8.    Baseline R-1 lbock    Time 12    Period Weeks    Status New                   Plan - 07/31/21 1115     Clinical Impression Statement Patient with limited visits for OT/PT/SLP.  He indicates that he really wants to be able to use his hand better, but he also has significant  speech/language issues.  Discussed options with patient and mom.  Considering moving to 1x/week for OT with continued emphasis on practice in home setting.  Patient and mom are actively working on exercise programs at home.    OT Occupational Profile and History Detailed Assessment- Review of Records and additional review of physical, cognitive, psychosocial history related to current functional performance    Occupational performance deficits (Please refer to evaluation for details): ADL's;IADL's;Work;Leisure    Body Structure / Function / Physical Skills ADL;Decreased knowledge of use of DME;Strength;Tone;GMC;Dexterity;Balance;Body mechanics;UE functional use;ROM;IADL;Mobility;Coordination;FMC    Cognitive Skills Memory;Understand    Rehab Potential Good    Clinical Decision Making Several treatment options, min-mod task modification necessary    Comorbidities Affecting Occupational Performance: Presence of comorbidities impacting occupational performance    Comorbidities impacting occupational performance description: hx or R RTC tear with prior limitations, possible seizure hx, arthritis, obesity    Modification or Assistance to Complete Evaluation  Min-Moderate modification of tasks or assist with assess necessary to complete eval    OT Frequency 2x / week    OT Duration 12 weeks    OT Treatment/Interventions Self-care/ADL training;Moist Heat;DME and/or AE instruction;Fluidtherapy;Splinting;Balance training;Therapeutic activities;Ultrasound;Therapeutic exercise;Cognitive remediation/compensation;Passive range of motion;Functional Mobility Training;Neuromuscular education;Cryotherapy;Electrical Stimulation;Energy conservation;Manual Therapy;Paraffin;Patient/family education    Plan review HEP, Check thumb opponens splint -work in function, Oval 8 finger splint to prevent long finger PIP hyperextension - need to reschedule to extend his limited visits over time - considering 1x/week for OT.     Consulted and Agree with Plan of Care Patient;Family member/caregiver    Family Member Consulted mother             Patient will benefit from skilled therapeutic intervention in order to improve the following deficits and impairments:   Body Structure / Function / Physical Skills: ADL, Decreased knowledge of use of DME, Strength, Tone, GMC, Dexterity, Balance, Body mechanics, UE functional use, ROM, IADL, Mobility, Coordination, [facility] Cognitive Skills: Memory, Understand     Visit Diagnosis: Hemiplegia and hemiparesis following cerebral infarction affecting right dominant side (HCC)  Other lack of coordination  Unsteadiness on feet  Spastic hemiplegia of right dominant side as late effect of cerebral infarction (HCC)  Other symptoms and signs involving the musculoskeletal system    Problem List Patient Active Problem List   Diagnosis Date Noted   Primary osteoarthritis of left knee 11/07/2020   Gout flare 09/06/2020   Type 2 diabetes mellitus (HCC) 09/06/2020   Chronic pain of left knee    Expressive aphasia    Chronic pain of right knee    Right elbow pain    Acute blood loss anemia    Right hemiparesis (HCC)    Right hemiplegia (HCC)    Hyponatremia    AKI (acute kidney injury) (HCC)    Benign essential HTN    Pressure injury of skin 08/09/2020   Dyslipidemia    Controlled type 2 diabetes mellitus with hyperglycemia,  without long-term current use of insulin (HCC)    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   Middle cerebral artery stenosis, 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   Morbid obesity (HCC) 05/24/2020   Cardiac murmur 05/24/2020   Arthritis    High cholesterol    Hyperlipemia    Type 2 diabetes mellitus with hyperglycemia (HCC)    Cellulitis 01/03/2015   Cellulitis of right lower extremity 01/03/2015   Sepsis (HCC) 01/03/2015   Hypertension    GERD (gastroesophageal reflux disease)    Essential hypertension    Cellulitis of right leg    Rotator cuff tear, right 09/08/2011     Collier SalinaGellert, Sheccid Lahmann M, OT 07/31/2021, 11:21 AM  [facility] [address] [place], [place], [address] Phone: [phone]   Fax:  [phone]  Name: Justin Mason MRN: [id number] Date of Birth: Aug 25, 1980

## 2021-07-31 NOTE — Therapy (Signed)
[facility] [address] [place], [place], [address] Phone: [phone]   Fax:  [phone]  Speech Language Pathology Treatment  Patient Details  Name: BENTLEY YOKEL MRN: [id number] Date of Birth: 10/24/1980 Referring Provider (SLP): Frann Rider NP   Encounter Date: 07/31/2021   End of Session - 07/31/21 1201     Visit Number 3    Number of Visits 25    Date for SLP Re-Evaluation 09/12/21    Authorization Type [employer] - 30 visit combined limit; $40 copay per discipline    Authorization Time Period mom is keeping track - today was total of 5 visits for all combined    SLP Start Time 1100    SLP Stop Time  1145    SLP Time Calculation (min) 45 min    Activity Tolerance Patient tolerated treatment well             Past Medical History:  Diagnosis Date   AKI (acute kidney injury) ([place])    Arthritis    Cardiac murmur 05/24/2020   Cellulitis 01/03/2015   Cellulitis of right leg    Cellulitis of right lower extremity 01/03/2015   Essential hypertension    GERD (gastroesophageal reflux disease)    High cholesterol    Hyperlipemia    Hypertension    Morbid obesity ([place]) 05/24/2020   OSA (obstructive sleep apnea)    Rotator cuff tear, right 09/08/2011   Sepsis ([place]) 01/03/2015   Stroke ([place])    Type 2 diabetes mellitus with hyperglycemia ([place])     Past Surgical History:  Procedure Laterality Date   INCISION AND DRAINAGE FOOT  1991-1992   STEPPED ON NAIL / INFECTED WOUND   IR ANGIO INTRA EXTRACRAN SEL COM CAROTID INNOMINATE BILAT MOD SED  07/24/2020   IR ANGIO VERTEBRAL SEL SUBCLAVIAN INNOMINATE UNI L MOD SED  07/24/2020   IR ANGIO VERTEBRAL SEL VERTEBRAL UNI R MOD SED  07/24/2020   RADIOLOGY WITH ANESTHESIA N/A 07/24/2020   Procedure: IR WITH ANESTHESIA - CODE STROKE;  Surgeon: Radiologist, Medication, MD;  Location: [facility];  Service: Radiology;  Laterality: N/A;   SHOULDER SURGERY     September 08, 2011     There were no vitals filed for this visit.   Subjective Assessment - 07/31/21 1109     Subjective "He's wantin off of some of those"    Currently in Pain? No/denies                   ADULT SLP TREATMENT - 07/31/21 1109       General Information   Behavior/Cognition Alert;Cooperative;Pleasant mood      Treatment Provided   Treatment provided Cognitive-Linquistic      Cognitive-Linquistic Treatment   Treatment focused on Aphasia;Patient/family/caregiver education    Skilled Treatment [place] brought in [facility] on his mom's phone, read 2 lists of farm equipment and tools with rare min A. We used VNeST to target word finding,with verbs (measure, throw, deliver, drive) with extended time and occasional min verba cues he genreated 3 subject and objects for each verb (total of 12) and generated 3 complex sentences, 1 for each verb with extedned time and occasional min questioning cues. Targeted verbal compensations for aphasia gemerating 3 sentence/phrase descriptions of simple objects. He required extended time and questioning cues for 8/12 objects.. Instructed mom and Basil to attempt descriptions or "give clues" when he can't find a word, for mom to guess  Assessment / Recommendations / Plan   Plan Continue with current plan of care      Progression Toward Goals   Progression toward goals Progressing toward goals              SLP Education - 07/31/21 1200     Education Details verbal compensations for aphasia    Person(s) Educated Patient;Parent(s)    Methods Explanation;Demonstration;Verbal cues;Handout    Comprehension Verbalized understanding;Returned demonstration;Verbal cues required;Need further instruction              SLP Short Term Goals - 07/31/21 1201       SLP SHORT TERM GOAL #1   Title Pt will name 10 items in personally relevant category with rare min A over 2 sessions    Time 4    Period Weeks    Status On-going      SLP SHORT TERM GOAL #2    Title Pt will generate simple sentences in structured tasks with rare min A 9/10 opportunitites    Time 4    Period Weeks    Status On-going      SLP SHORT TERM GOAL #3   Title Pt and family will implement 2 multimodal communication strategies to  support pt's auditory comprehension to report 25% subjectively reduced frustration with communication at home.    Time 4    Period Weeks    Status On-going      SLP SHORT TERM GOAL #4   Title Pt will ID and self correct errors in text or written communication 8/10x with usual min A    Time 4    Period Weeks    Status On-going      SLP SHORT TERM GOAL #5   Title Pt will use verbal or gestural compensations for aphasia as needed 8/10x on structured naming tasks with occasional min A    Time 4    Period Weeks    Status On-going              SLP Long Term Goals - 07/31/21 1201       SLP LONG TERM GOAL #1   Title Pt will comprehend 4 turns in simple conversation with no requests for repetition over 3 sessions    Time 12    Period Weeks    Status On-going      SLP LONG TERM GOAL #2   Title In 5 minute simple conversation, Seton will successfully use multimodal compensations for aphasia as needed 3/5x with occasional min A    Time 12    Period Weeks    Status On-going      SLP LONG TERM GOAL #3   Title Pt will write complex sentences in structured task, such as VNeST, ID and self correct 8/10 errors with occasional min A    Time 12    Period Weeks    Status On-going      SLP LONG TERM GOAL #4   Title Pt will improve score on Cognitive Participation Bank - Short Form, by 3 points (original score 4/30)    Time 12    Period Weeks    Status On-going              Plan - 07/31/21 1200     Clinical Impression Statement Hezron Brodeur is referred b neurology due to 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 He is accompanied by his mother, [name]. Prior to CVA, Jonluke was independent in IADL's and worked as a Patent examiner  fitter, Building control surveyor and  Printmaker. They both report ongoing frustration with communciation. 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 Pt often relied on mother for prompting to expand his verbal expression. SLP initiated education and training for anomia compensations and HEP to implement at home. Due to new insurance, family may proceed to limit visits due to financial constraints. I recommend skilled ST to maximize communication for safety, reduce caregiver burden and improve life participation.    Speech Therapy Frequency 2x / week    Duration 12 weeks    Treatment/Interventions Language facilitation;Environmental controls;Cueing hierarchy;SLP instruction and feedback;Compensatory strategies;Functional tasks;Cognitive reorganization;Compensatory techniques;Patient/family education;Multimodal communcation approach;Internal/external aids    Potential to Achieve Goals Fair    Potential Considerations Financial resources;Severity of impairments             Patient will benefit from skilled therapeutic intervention in order to improve the following deficits and impairments:   Aphasia    Problem List Patient Active Problem List   Diagnosis Date Noted   Primary osteoarthritis of left knee 11/07/2020   Gout flare 09/06/2020   Type 2 diabetes mellitus ([place]) 09/06/2020   Chronic pain of left knee    Expressive aphasia    Chronic pain of right knee    Right elbow pain    Acute blood loss anemia    Right hemiparesis ([facility])    Right hemiplegia ([facility])    Hyponatremia    AKI (acute kidney injury) ([place])    Benign essential HTN    Pressure injury of skin 08/09/2020   Dyslipidemia    Controlled type 2 diabetes mellitus with hyperglycemia, without long-term current use of insulin ([place])    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 [place], [place], [address] Phone: [phone]   Fax:  [phone]   Name: JULIANNA CUARTAS MRN: [id number] Date of Birth: 1981/03/27

## 2021-08-01 NOTE — Addendum Note (Signed)
Addended by: Jacqulynn Cadet on: 08/01/2021 09:16 AM   Modules accepted: Orders

## 2021-08-04 ENCOUNTER — Encounter: Payer: Self-pay | Admitting: Speech Pathology

## 2021-08-04 ENCOUNTER — Encounter: Payer: Self-pay | Admitting: Physical Therapy

## 2021-08-04 ENCOUNTER — Other Ambulatory Visit: Payer: Self-pay

## 2021-08-04 ENCOUNTER — Ambulatory Visit: Payer: BC Managed Care – PPO | Admitting: Physical Therapy

## 2021-08-04 ENCOUNTER — Ambulatory Visit: Payer: BC Managed Care – PPO | Admitting: Speech Pathology

## 2021-08-04 ENCOUNTER — Encounter: Payer: Self-pay | Admitting: Occupational Therapy

## 2021-08-04 ENCOUNTER — Ambulatory Visit: Payer: BC Managed Care – PPO | Admitting: Occupational Therapy

## 2021-08-04 DIAGNOSIS — R4701 Aphasia: Secondary | ICD-10-CM

## 2021-08-04 DIAGNOSIS — R29898 Other symptoms and signs involving the musculoskeletal system: Secondary | ICD-10-CM

## 2021-08-04 DIAGNOSIS — R2689 Other abnormalities of gait and mobility: Secondary | ICD-10-CM

## 2021-08-04 DIAGNOSIS — R2681 Unsteadiness on feet: Secondary | ICD-10-CM

## 2021-08-04 DIAGNOSIS — R278 Other lack of coordination: Secondary | ICD-10-CM

## 2021-08-04 DIAGNOSIS — I69351 Hemiplegia and hemiparesis following cerebral infarction affecting right dominant side: Secondary | ICD-10-CM

## 2021-08-04 DIAGNOSIS — I69318 Other symptoms and signs involving cognitive functions following cerebral infarction: Secondary | ICD-10-CM

## 2021-08-04 NOTE — Therapy (Signed)
[facility] [address] [place], [place], [address] Phone: [phone]   Fax:  [phone]  Speech Language Pathology Treatment  Patient Details  Name: Justin Mason MRN: [id number] Date of Birth: [birth year] Referring Provider (SLP): Ihor Austin NP   Encounter Date: 08/04/2021   End of Session - 08/04/21 1242     Visit Number 4    Number of Visits 25    Date for SLP Re-Evaluation 09/12/21    Authorization Type [employer] - 30 visit combined limit; $40 copay per discipline    Authorization Time Period mom is keeping track - today was total of 8 visits for all combined    SLP Start Time 1015    SLP Stop Time  1100    SLP Time Calculation (min) 45 min    Activity Tolerance Patient tolerated treatment well             Past Medical History:  Diagnosis Date   AKI (acute kidney injury) ([facility])    Arthritis    Cardiac murmur 05/24/2020   Cellulitis 01/03/2015   Cellulitis of right leg    Cellulitis of right lower extremity 01/03/2015   Essential hypertension    GERD (gastroesophageal reflux disease)    High cholesterol    Hyperlipemia    Hypertension    Morbid obesity ([facility]) 05/24/2020   OSA (obstructive sleep apnea)    Rotator cuff tear, right 09/08/2011   Sepsis ([facility]) 01/03/2015   Stroke ([facility])    Type 2 diabetes mellitus with hyperglycemia ([facility])     Past Surgical History:  Procedure Laterality Date   INCISION AND DRAINAGE FOOT  1991-1992   STEPPED ON NAIL / INFECTED WOUND   IR ANGIO INTRA EXTRACRAN SEL COM CAROTID INNOMINATE BILAT MOD SED  07/24/2020   IR ANGIO VERTEBRAL SEL SUBCLAVIAN INNOMINATE UNI L MOD SED  07/24/2020   IR ANGIO VERTEBRAL SEL VERTEBRAL UNI R MOD SED  07/24/2020   RADIOLOGY WITH ANESTHESIA N/A 07/24/2020   Procedure: IR WITH ANESTHESIA - CODE STROKE;  Surgeon: Radiologist, Medication, MD;  Location[facility];  Service: Radiology;  Laterality: N/A;   SHOULDER SURGERY     September 08, 2011     There were no vitals filed for this visit.   Subjective Assessment - 08/04/21 1023     Subjective "He went hunting"    Patient is accompained by: Family member   mom   Currently in Pain? No/denies                   ADULT SLP TREATMENT - 08/04/21 1023       General Information   Behavior/Cognition Alert;Cooperative;Pleasant mood      Treatment Provided   Treatment provided Cognitive-Linquistic      Cognitive-Linquistic Treatment   Treatment focused on Aphasia;Patient/family/caregiver education    Skilled Treatment Justin Mason enters today with 1 word only saying yes. Mom  reports Justin Mason did carryover straegy of using descriptions several times over the weekend. Targeted verbal descriptions of simple objects - with frequent questioning cues he generated descriptions for 15 objecgts. Targeted word fining, verbal apraxia and sentence generation with VNeST. With 3 verbs, catch, sell, kick, Justin Mason required frequent mod verbal and questioning cues to generate 3 subject and objects for each verb, for a total of 9 subjects and objects. He generated 3 complex sentences , 1 for each verb, by answering "wh" questions with extended time consistently and frequent mod to max verbal cues and  questioning cues. Trained pt and mom in HEP for verbal apraxia and added this for home practice      Assessment / Recommendations / Plan   Plan Continue with current plan of care      Progression Toward Goals   Progression toward goals Progressing toward goals              SLP Education - 08/04/21 1236     Education Details HEP for verbal apraxia    Person(s) Educated Patient;Parent(s)    Methods Explanation;Demonstration;Verbal cues;Handout    Comprehension Verbalized understanding;Returned demonstration;Verbal cues required;Need further instruction              SLP Short Term Goals - 08/04/21 1241       SLP SHORT TERM GOAL #1   Title Pt will name 10 items in personally relevant category  with rare min A over 2 sessions    Time 3    Period Weeks    Status On-going      SLP SHORT TERM GOAL #2   Title Pt will generate simple sentences in structured tasks with rare min A 9/10 opportunitites    Time 3    Period Weeks    Status On-going      SLP SHORT TERM GOAL #3   Title Pt and family will implement 2 multimodal communication strategies to  support pt's auditory comprehension to report 25% subjectively reduced frustration with communication at home.    Time 3    Period Weeks    Status On-going      SLP SHORT TERM GOAL #4   Title Pt will ID and self correct errors in text or written communication 8/10x with usual min A    Time 3    Period Weeks    Status On-going      SLP SHORT TERM GOAL #5   Title Pt will use verbal or gestural compensations for aphasia as needed 8/10x on structured naming tasks with occasional min A    Time 3    Period Weeks    Status On-going              SLP Long Term Goals - 08/04/21 1242       SLP LONG TERM GOAL #1   Title Pt will comprehend 4 turns in simple conversation with no requests for repetition over 3 sessions    Time 11    Period Weeks    Status On-going      SLP LONG TERM GOAL #2   Title In 5 minute simple conversation, Justin RuizJohn will successfully use multimodal compensations for aphasia as needed 3/5x with occasional min A    Time 11    Period Weeks    Status On-going      SLP LONG TERM GOAL #3   Title Pt will write complex sentences in structured task, such as VNeST, ID and self correct 8/10 errors with occasional min A    Time 11    Period Weeks    Status On-going      SLP LONG TERM GOAL #4   Title Pt will improve score on Cognitive Participation Bank - Short Form, by 3 points (original score 4/30)    Time 11    Period Weeks    Status On-going              Plan - 08/04/21 1237     Clinical Impression Statement 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 Ongoing training in verbal and  non verbal  compensations for aphasia, HEP for verbal apraxia and targeting word finding, sentence generation as well as training mom in cueing strategies. Continue skilled ST to maximize communication for safety, reduce caregiver burden and improve life participation    Speech Therapy Frequency 2x / week    Duration 12 weeks    Treatment/Interventions Language facilitation;Environmental controls;Cueing hierarchy;SLP instruction and feedback;Compensatory strategies;Functional tasks;Cognitive reorganization;Compensatory techniques;Patient/family education;Multimodal communcation approach;Internal/external aids    Potential to Achieve Goals Fair    Potential Considerations Financial resources;Severity of impairments             Patient will benefit from skilled therapeutic intervention in order to improve the following deficits and impairments:   Aphasia    Problem List Patient Active Problem List   Diagnosis Date Noted   Primary osteoarthritis of left knee 11/07/2020   Gout flare 09/06/2020   Type 2 diabetes mellitus ([facility]) 09/06/2020   Chronic pain of left knee    Expressive aphasia    Chronic pain of right knee    Right elbow pain    Acute blood loss anemia    Right hemiparesis ([facility])    Right hemiplegia ([facility])    Hyponatremia    AKI (acute kidney injury) ([facility])    Benign essential HTN    Pressure injury of skin 08/09/2020   Dyslipidemia    Controlled type 2 diabetes mellitus with hyperglycemia, without long-term current use of insulin ([facility])    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   Middle cerebral artery stenosis, 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   Morbid obesity ([facility]) 05/24/2020   Cardiac murmur 05/24/2020   Arthritis    High cholesterol    Hyperlipemia    Type 2 diabetes mellitus with hyperglycemia ([facility])    Cellulitis 01/03/2015   Cellulitis of right lower extremity 01/03/2015   Sepsis ([facility]) 01/03/2015   Hypertension    GERD (gastroesophageal reflux disease)    Essential hypertension     Cellulitis of right leg    Rotator cuff tear, right 09/08/2011    [place], Radene Journey, CCC-SLP 08/04/2021, 12:44 PM  [facility] [address] [place], [place], [address] Phone: [phone]   Fax:  [phone]   Name: Justin Mason MRN: [id number] Date of Birth: Mar 31, 1981

## 2021-08-04 NOTE — Therapy (Signed)
[facility] [address] [place], [place], [address] Phone: [phone]   Fax:  [phone]  Occupational Therapy Treatment  Patient Details  Name: Justin Mason MRN: [id number] Date of Birth: November 01, 1980 Referring Provider (OT): Ihor Austin, NP   Encounter Date: 08/04/2021   OT End of Session - 08/04/21 1359     Visit Number 4    Number of Visits 25    Date for OT Re-Evaluation 09/14/21    Authorization Type [employer] - $40 copay per discipline. visit limit: 30 total visits for all therapies combined    OT Start Time 0935    OT Stop Time 1015    OT Time Calculation (min) 40 min    Activity Tolerance Patient tolerated treatment well    Behavior During Therapy [facility] for tasks assessed/performed             Past Medical History:  Diagnosis Date   AKI (acute kidney injury) (HCC)    Arthritis    Cardiac murmur 05/24/2020   Cellulitis 01/03/2015   Cellulitis of right leg    Cellulitis of right lower extremity 01/03/2015   Essential hypertension    GERD (gastroesophageal reflux disease)    High cholesterol    Hyperlipemia    Hypertension    Morbid obesity (HCC) 05/24/2020   OSA (obstructive sleep apnea)    Rotator cuff tear, right 09/08/2011   Sepsis (HCC) 01/03/2015   Stroke (HCC)    Type 2 diabetes mellitus with hyperglycemia (HCC)     Past Surgical History:  Procedure Laterality Date   INCISION AND DRAINAGE FOOT  1991-1992   STEPPED ON NAIL / INFECTED WOUND   IR ANGIO INTRA EXTRACRAN SEL COM CAROTID INNOMINATE BILAT MOD SED  07/24/2020   IR ANGIO VERTEBRAL SEL SUBCLAVIAN INNOMINATE UNI L MOD SED  07/24/2020   IR ANGIO VERTEBRAL SEL VERTEBRAL UNI R MOD SED  07/24/2020   RADIOLOGY WITH ANESTHESIA N/A 07/24/2020   Procedure: IR WITH ANESTHESIA - CODE STROKE;  Surgeon: Radiologist, Medication, MD;  Location: [facility] OR;  Service: Radiology;  Laterality: N/A;   SHOULDER SURGERY     September 08, 2011    There  were no vitals filed for this visit.   Subjective Assessment - 08/04/21 0941     Subjective  splint bothering him dorsally--was adjusted today    Patient is accompanied by: Family member    Pertinent History Spastic hemiplegia of R side as late effect of cerebral infarction 07/24/20.     PMH:  obesity, HTN, hyperlipidemia, arthritis, DM, OSA, hx of R RTC tear 09/2011, possible recent seizure    Limitations possible seizure hx    Patient Stated Goals be able to use R hand    Currently in Pain? No/denies              Adjusted/modified short opponens splint dorsally for incr comfort (pt reports rubbing), remolded/smoothed and added padding.  Pt reports incr comfort with use and wore for remainder of OT session.  Pt also fitted for oval 8 splint for R 3rd digit due PIP hyperext to improve positioning and assist with functional grasp and release.  Pt/mother instructed in splint wear/care and precautions (to monitor skin closely).    Reviewed Initial HEP.--pt returned demo each with min cueing for technique and min facilitation for improved positioning/ROM.     Grasp/release of wooden/cylinder pegs from pegboard (low range) with min facilitation for reach (wearing splints).  OT Education - 08/04/21 1417     Education Details Added closed-chain shoulder abduction to HEP--see pt instructions; Emphasized trying to pick up/grasp items with RUE    Person(s) Educated Patient;Parent(s)    Methods Explanation;Demonstration;Tactile cues;Handout;Verbal cues    Comprehension Verbalized understanding;Returned demonstration;Tactile cues required              OT Short Term Goals - 07/31/21 1120       OT SHORT TERM GOAL #1   Title Pt will be independent with initial HEP.--check STGs 07/17/21    Time 4    Period Weeks    Status On-going      OT SHORT TERM GOAL #2   Title Pt will be independent with splint wear/care for improved R hand positioning.    Time 4    Period Weeks     Status New      OT SHORT TERM GOAL #3   Title Pt demo at least 30* active R shoulder flex for functional reach.    Time 4    Period Weeks    Status New      OT SHORT TERM GOAL #4   Title Pt will be able to consistently use RUE as stabilizer/gross assist for simple tasks.    Time 4    Period Weeks    Status New      OT SHORT TERM GOAL #5   Title Pt will be able to perform low-level reach to grasp/release small cylinder object without assist.    Time 4    Period Weeks    Status New               OT Long Term Goals - 07/31/21 1120       OT LONG TERM GOAL #1   Title Pt will be independent with updated HEP.--check LTGs 09/14/21    Time 12    Period Weeks    Status New      OT LONG TERM GOAL #2   Title Pt will perform BADLs mod I.    Time 12    Period Weeks    Status New      OT LONG TERM GOAL #3   Title Pt will be able to perform simple snack prep mod I.    Time 12    Period Weeks    Status New      OT LONG TERM GOAL #4   Title Pt will perform simple home maintenance task mod I.    Time 12    Period Weeks    Status New      OT LONG TERM GOAL #5   Title Pt will demo at least 45* R shoulder flex for active reach.    Time 12    Period Weeks    Status New      OT LONG TERM GOAL #6   Title Pt will improve RUE reach/coordination for ADLs as shown by improve score on box and blocks test to 8.    Baseline R-1 lbock    Time 12    Period Weeks    Status New                   Plan - 08/04/21 1400     Clinical Impression Statement Pt returned demo initial HEP with occasional min v.c. for technique.  Pt demo improved pinch/grasp with thumb opponens splint after modification.  Pt is progressing towards goals.    OT Occupational Profile and History  Detailed Assessment- Review of Records and additional review of physical, cognitive, psychosocial history related to current functional performance    Occupational performance deficits (Please refer to  evaluation for details): ADL's;IADL's;Work;Leisure    Body Structure / Function / Physical Skills ADL;Decreased knowledge of use of DME;Strength;Tone;GMC;Dexterity;Balance;Body mechanics;UE functional use;ROM;IADL;Mobility;Coordination;FMC    Cognitive Skills Memory;Understand    Rehab Potential Good    Clinical Decision Making Several treatment options, min-mod task modification necessary    Comorbidities Affecting Occupational Performance: Presence of comorbidities impacting occupational performance    Comorbidities impacting occupational performance description: hx or R RTC tear with prior limitations, possible seizure hx, arthritis, obesity    Modification or Assistance to Complete Evaluation  Min-Moderate modification of tasks or assist with assess necessary to complete eval    OT Frequency 2x / week    OT Duration 12 weeks    OT Treatment/Interventions Self-care/ADL training;Moist Heat;DME and/or AE instruction;Fluidtherapy;Splinting;Balance training;Therapeutic activities;Ultrasound;Therapeutic exercise;Cognitive remediation/compensation;Passive range of motion;Functional Mobility Training;Neuromuscular education;Cryotherapy;Electrical Stimulation;Energy conservation;Manual Therapy;Paraffin;Patient/family education    Plan reduced OT to 1x this week due to limited visits to trial prior to reducing for remainder of schedule (may need to modify schedule if pt wants to continue at 1x/wk).  check splints prn; wt. bearing (add to HEP), neuro re-ed/functional use RUE    Consulted and Agree with Plan of Care Patient;Family member/caregiver    Family Member Consulted mother             Patient will benefit from skilled therapeutic intervention in order to improve the following deficits and impairments:   Body Structure / Function / Physical Skills: ADL, Decreased knowledge of use of DME, Strength, Tone, GMC, Dexterity, Balance, Body mechanics, UE functional use, ROM, IADL, Mobility,  Coordination, [facility] Cognitive Skills: Memory, Understand     Visit Diagnosis: Other lack of coordination  Spastic hemiplegia of right dominant side as late effect of cerebral infarction (HCC)  Other symptoms and signs involving the musculoskeletal system  Other symptoms and signs involving cognitive functions following cerebral infarction  Unsteadiness on feet    Problem List Patient Active Problem List   Diagnosis Date Noted   Primary osteoarthritis of left knee 11/07/2020   Gout flare 09/06/2020   Type 2 diabetes mellitus (HCC) 09/06/2020   Chronic pain of left knee    Expressive aphasia    Chronic pain of right knee    Right elbow pain    Acute blood loss anemia    Right hemiparesis (HCC)    Right hemiplegia (HCC)    Hyponatremia    AKI (acute kidney injury) (HCC)    Benign essential HTN    Pressure injury of skin 08/09/2020   Dyslipidemia    Controlled type 2 diabetes mellitus with hyperglycemia, without long-term current use of insulin (HCC)    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   Middle cerebral artery stenosis, 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   Morbid obesity (HCC) 05/24/2020   Cardiac murmur 05/24/2020   Arthritis    High cholesterol    Hyperlipemia    Type 2 diabetes mellitus with hyperglycemia (HCC)    Cellulitis 01/03/2015   Cellulitis of right lower extremity 01/03/2015   Sepsis (HCC) 01/03/2015   Hypertension    GERD (gastroesophageal reflux disease)    Essential hypertension    Cellulitis of right leg    Rotator cuff tear, right 09/08/2011    Willa FraterFREEMAN,Illiana Losurdo, OT 08/04/2021, 2:18 PM  [facility] [address] [place], [place], [address] Phone: [phone]  Fax:  [phone]  Name: Arn MedalJohn L Quintanar MRN: [id number] Date of Birth: [birth year]   Willa FraterAngela Arielle Eber, OTR/L [facility] [address] [place], [place]  [address] [phone] phone [phone] 08/04/21 2:18  PM

## 2021-08-04 NOTE — Patient Instructions (Addendum)
°  Functional Quadriceps: Sit to Stand ° ° ° °Sit on edge of chair, feet flat on floor. Stand upright, extending knees fully. °Repeat __10__ times per set. Do __1__ sets per session. Do __1__ sessions per day. ° ° ° ° ° ° °Standing Marching ° ° °Using a chair if necessary, march in place. °Repeat 10 times. Do 1 sessions per day. ° °  ° °Hip Backward Kick ° ° °Using a chair for balance, keep legs shoulder width apart and toes pointed for- ward. Slowly extend one leg back, keeping knee straight. Do not lean forward. Repeat with other leg. °Repeat 10 times. Do 1 sessions per day. ° ° ° ° °Hip Side Kick ° ° °Holding a chair for balance, keep legs shoulder width apart and toes pointed forward. Swing a leg out to side, keeping knee straight. Do not lean. Repeat using other leg. °Repeat 10 times. Do 1 sessions per day. ° °ALSO DO FORWARD KICKS - ALTERNATING LEGS - 10 TIMES EACH - AT COUNTER ° ° °Feet Heel-Toe "Tandem", Varied Arm Positions - Eyes Open - PARTIAL HEEL TO TOE ° ° °With eyes open, right foot directly in front of the other, arms out, look straight ahead at a stationary object. Hold 30  seconds. °Repeat 2 times per session. Do 1 sessions per day. ° ° ° °Standing On One Leg Without Support °. ° °Stand on one leg in neutral spine without support. Hold  10 seconds. °Repeat on other leg. °Do 2 repetitions, 1 sets. ° ° ° °Side-Stepping ° ° °Walk to left side with eyes open. Take even steps, leading with same foot. Make sure each foot lifts off the floor. Repeat in opposite direction. °Repeat for 2-3 reps along counter.  Do 1 sessions per day ° ° ° °  °

## 2021-08-04 NOTE — Patient Instructions (Addendum)
° ° °  SELF ASSISTED WITH OBJECT: Shoulder Abduction / External Rotation (Crutch)    Place one hand on crutch or other assistive device. Move arm away from body. 10 reps per set, 1-2 sets per day

## 2021-08-04 NOTE — Therapy (Signed)
[facility] [address] [place], [place], [address] Phone: [phone]   Fax:  [phone]  Physical Therapy Treatment  Patient Details  Name: Justin Mason MRN: [id number] Date of Birth: 08/07/1980 Referring Provider (PT): Frann Rider, NP   Encounter Date: 08/04/2021   PT End of Session - 08/04/21 1148     Visit Number 3    Number of Visits 17    Date for PT Re-Evaluation 08/15/21    Authorization Type [employer]    PT Start Time 1108    PT Stop Time 1146    PT Time Calculation (min) 38 min    Activity Tolerance Patient tolerated treatment well             Past Medical History:  Diagnosis Date   AKI (acute kidney injury) ([place])    Arthritis    Cardiac murmur 05/24/2020   Cellulitis 01/03/2015   Cellulitis of right leg    Cellulitis of right lower extremity 01/03/2015   Essential hypertension    GERD (gastroesophageal reflux disease)    High cholesterol    Hyperlipemia    Hypertension    Morbid obesity ([place]) 05/24/2020   OSA (obstructive sleep apnea)    Rotator cuff tear, right 09/08/2011   Sepsis ([place]) 01/03/2015   Stroke ([place])    Type 2 diabetes mellitus with hyperglycemia ([place])     Past Surgical History:  Procedure Laterality Date   INCISION AND DRAINAGE FOOT  1991-1992   STEPPED ON NAIL / INFECTED WOUND   IR ANGIO INTRA EXTRACRAN SEL COM CAROTID INNOMINATE BILAT MOD SED  07/24/2020   IR ANGIO VERTEBRAL SEL SUBCLAVIAN INNOMINATE UNI L MOD SED  07/24/2020   IR ANGIO VERTEBRAL SEL VERTEBRAL UNI R MOD SED  07/24/2020   RADIOLOGY WITH ANESTHESIA N/A 07/24/2020   Procedure: IR WITH ANESTHESIA - CODE STROKE;  Surgeon: Radiologist, Medication, MD;  Location: [facility];  Service: Radiology;  Laterality: N/A;   SHOULDER SURGERY     September 08, 2011    There were no vitals filed for this visit.   Subjective Assessment - 08/04/21 1109     Subjective Pt reports working on HEP since last visit.    Pertinent History  HTN, morbid obesity, OSA, Lt CVA on 07-24-20, acute kidney injury, expressive aphasia, chronic pain bil. knees, type 2 DM, h/o Rt rotator cuff sx 09/2011, possible recent siezure    Patient Stated Goals walk with the cane - not have to use the rollator    Currently in Pain? No/denies                               [facility] Adult PT Treatment/Exercise - 08/04/21 0001       Ambulation/Gait   Ambulation/Gait Yes    Ambulation/Gait Assistance 4: Min guard;5: Supervision    Ambulation/Gait Assistance Details working on gait with SPC, cues for symmetrical steplength    Ambulation Distance (Feet) 115 Feet   x2   Assistive device Straight cane    Gait Pattern Step-through pattern    Ambulation Surface Level;Indoor    Gait Comments In parallel bars worked in progressively decreasing UE support 1 to no support with cues for symmetrical gait pattern.      Exercises   Exercises Knee/Hip   Standing hip alt ABD, altExt, marching in place, x10 each with bilateral UE support, cues for technique  Balance Exercises - 08/04/21 0001       Balance Exercises: Standing   Tandem Stance Upper extremity support 1;Eyes open;4 reps;20 secs   alt foot in front cues for RLE control; progressed to no UE support.               PT Education - 08/04/21 1123     Education Details Reviewed HEP. Recommend pt continue to use rollator for community gait.  May work on gait in home on level surfaces with [facility] with supervision.    Person(s) Educated Patient    Methods Explanation    Comprehension Verbalized understanding              PT Short Term Goals - 07/30/21 1412       PT SHORT TERM GOAL #1   Title Pt will amb. 75' with SBQC with CGA on flat, even surface for increased community accessibility.    Baseline 115' with SBQC on 07-29-21    Time 4    Period Weeks    Status Partially Met    Target Date 07/18/21      PT SHORT TERM GOAL #2   Title Increase gait  velocity from 1.52 ft/sec to >/= 1.9 ft/sec with use of rollator for increased gait efficiency.    Baseline 21.62 secs = 1.52 ft/sec with rollator;    Time 4    Period Weeks    Status On-going    Target Date 07/18/21      PT SHORT TERM GOAL #3   Title Improve TUG score from 31.19 secs with rollator to </= 26 secs with rollator for reduced fall risk.    Baseline 33.75    Time 4    Period Weeks    Status On-going    Target Date 07/18/21      PT SHORT TERM GOAL #4   Title Pt will participate in [place] assessment.    Baseline due to limited visits - time was focused on HEP instruction    Time 4    Period Weeks    Status Deferred    Target Date 07/18/21      PT SHORT TERM GOAL #5   Title Negotiate steps with use of 1 handrail and SBQC with min to CGA.    Baseline met 07-29-21    Time 4    Period Weeks    Status Achieved    Target Date 07/18/21               PT Long Term Goals - 07/30/21 1414       PT LONG TERM GOAL #1   Title Pt will be modified independent without device for household ambulation.    Time 8    Period Weeks    Status New    Target Date 08/15/21      PT LONG TERM GOAL #2   Title Pt will amb. 500' with SBQC on outdoor paved surfaces with supervision for increased community accessibiity.    Time 8    Period Weeks    Status New    Target Date 08/15/21      PT LONG TERM GOAL #3   Title Negotiate 4 steps with quad cane without use of hand rail with supervision.    Time 8    Period Weeks    Status New    Target Date 08/15/21      PT LONG TERM GOAL #4   Title Improve TUG score to </= 23 secs  with [facility] for reduced fall risk.    Baseline 31.19 secs with rollator on 06-16-21    Time 8    Period Weeks    Status New    Target Date 08/15/21      PT LONG TERM GOAL #5   Title Improve Berg score by at least 8 points to reduce fall risk.    Time 8    Period Weeks    Status New    Target Date 08/15/21      PT LONG TERM GOAL #6   Title Independent in  HEP for balance and strengthening exs.    Time 8    Period Weeks    Status New    Target Date 08/15/21                   Plan - 08/04/21 1202     Clinical Impression Statement Pt demonstrates understanding of current HEP with min cues for posture and decreasing UE support.  Pt progress with gait training using SPC at min guard to supervision level with cues for visual scanning, symmetrical gait pattern, and awareness of R foot clearance.    Personal Factors and Comorbidities Comorbidity 2;Time since onset of injury/illness/exacerbation;Past/Current Experience;Fitness;Profession    Comorbidities HTN, morbid obesity, OSA, Lt CVA with Rt spastic hemiplegia 07-24-20, espressive aphasia, chronic pain bil. knees, type 2 DM, h/o Rt rotator cuff repair 09/2011, possible recent seizure    Examination-Activity Limitations Bed Mobility;Locomotion Level;Transfers;Squat;Bend;Stairs;Toileting    Examination-Participation Restrictions Occupation;Community Activity;Interpersonal Relationship;Laundry;Shop;Meal Prep;Yard Work;Cleaning    PT Treatment/Interventions ADLs/Self Care Home Management;DME Instruction;Gait training;Stair training;Therapeutic activities;Therapeutic exercise;Balance training;Neuromuscular re-education;Patient/family education    PT Next Visit Plan dynamic gait and standing balance with SPC    PT Home Exercise Plan see pt instructions - Balance HEP    Consulted and Agree with Plan of Care Patient;Family member/caregiver    Family Member Consulted mother [name]             Patient will benefit from skilled therapeutic intervention in order to improve the following deficits and impairments:  Abnormal gait, Decreased balance, Decreased activity tolerance, Decreased coordination, Decreased strength, Impaired tone, Impaired UE functional use, Obesity  Visit Diagnosis: Other abnormalities of gait and mobility  Unsteadiness on feet     Problem List Patient Active Problem  List   Diagnosis Date Noted   Primary osteoarthritis of left knee 11/07/2020   Gout flare 09/06/2020   Type 2 diabetes mellitus ([place]) 09/06/2020   Chronic pain of left knee    Expressive aphasia    Chronic pain of right knee    Right elbow pain    Acute blood loss anemia    Right hemiparesis (HCC)    Right hemiplegia (HCC)    Hyponatremia    AKI (acute kidney injury) ([place])    Benign essential HTN    Pressure injury of skin 08/09/2020   Dyslipidemia    Controlled type 2 diabetes mellitus with hyperglycemia, without long-term current use of insulin ([place])    Stroke (cerebrum) ([place]) 07/24/2020   Middle cerebral artery stenosis, 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   Morbid obesity ([place]) 05/24/2020   Cardiac murmur 05/24/2020   Arthritis    High cholesterol    Hyperlipemia    Type 2 diabetes mellitus with hyperglycemia ([place])    Cellulitis 01/03/2015   Cellulitis of right lower extremity 01/03/2015   Sepsis ([place]) 01/03/2015   Hypertension    GERD (gastroesophageal reflux disease)    Essential hypertension    Cellulitis  of right leg    Rotator cuff tear, right 09/08/2011    Bjorn Loser, PTA  08/04/21, 12:12 PM   [facility] [address] [place], [place], [address] Phone: [phone]   Fax:  [phone]  Name: SALOME COZBY MRN: [id number] Date of Birth: January 09, 1981

## 2021-08-07 ENCOUNTER — Encounter: Payer: Self-pay | Admitting: Occupational Therapy

## 2021-08-07 ENCOUNTER — Encounter: Payer: Self-pay | Admitting: Speech Pathology

## 2021-08-07 ENCOUNTER — Ambulatory Visit: Payer: Self-pay | Admitting: Physical Therapy

## 2021-08-11 ENCOUNTER — Other Ambulatory Visit: Payer: Self-pay

## 2021-08-11 ENCOUNTER — Ambulatory Visit: Payer: BC Managed Care – PPO | Attending: Family Medicine | Admitting: Physical Therapy

## 2021-08-11 ENCOUNTER — Encounter: Payer: Self-pay | Admitting: Physical Therapy

## 2021-08-11 ENCOUNTER — Encounter: Payer: Self-pay | Admitting: Speech Pathology

## 2021-08-11 ENCOUNTER — Ambulatory Visit: Payer: BC Managed Care – PPO | Admitting: Occupational Therapy

## 2021-08-11 ENCOUNTER — Ambulatory Visit: Payer: BC Managed Care – PPO | Admitting: Speech Pathology

## 2021-08-11 DIAGNOSIS — R2689 Other abnormalities of gait and mobility: Secondary | ICD-10-CM | POA: Insufficient documentation

## 2021-08-11 DIAGNOSIS — R2681 Unsteadiness on feet: Secondary | ICD-10-CM | POA: Insufficient documentation

## 2021-08-11 DIAGNOSIS — R4701 Aphasia: Secondary | ICD-10-CM | POA: Insufficient documentation

## 2021-08-11 DIAGNOSIS — I69351 Hemiplegia and hemiparesis following cerebral infarction affecting right dominant side: Secondary | ICD-10-CM | POA: Diagnosis present

## 2021-08-11 NOTE — Patient Instructions (Signed)
Strengthening: Hip Extension - Resisted    With tubing around right ankle, face anchor and pull leg straight back. Repeat _10___ times per set. Do __1-2_ sets per session. Do __1__ sessions per day.  http://orth.exer.us/636   Copyright  VHI. All rights reserved.      Strengthening: Hip Abduction - Resisted    With tubing around right leg, other side toward anchor, extend leg out from side. Repeat __10__ times per set. Do _1-2___ sets per session. Do __1__ sessions per day.  http://orth.exer.us/634   Copyright  VHI. All rights reserved.    Strengthening: Hip Flexion - Resisted    With tubing around left ankle, anchor behind, bring leg forward, keeping knee straight. Repeat _10___ times per set. Do _1-2__ sets per session. Do _1___ sessions per day.   ALSO do with knee bent - step on band with left foot - 10--20 reps   Knee Extension: Resisted (Sitting)    With band looped around right ankle and under other foot, straighten leg with ankle loop. Keep other leg bent to increase resistance. Repeat __10__ times per set. Do _1-2___ sets per session. Do __1_ sessions per day.  http://orth.exer.us/690   Copyright  VHI. All rights reserved.

## 2021-08-11 NOTE — Patient Instructions (Signed)
°  Lateral Weight Shift: Upper Trunk Leading   Sit with feet flat on floor. Bring __Rt__ shoulder, head and arm toward side until forearm/elbow touches sitting surface. Hold __5__ seconds. Return to upright position by pushing through arm Repeat _10___ times per session. Do __2__ sessions per day.   Forward Upper Body Weight Shift   Place both hands on a chair or stool in front. Lean body forward, come up to squat position, slowly rock forwards and backwards, then return slowly to seated position. Try to shift more weight onto involved arm. Repeat __10__ times. Do _2___ sessions per day.  http://gt2.exer.us/749   SITTING: Forward Weight Shift   Sit upright, place both hands on sitting surface. Lean chest forward, keep back straight. Hold _5__ seconds. _10__ reps per set, _2__ sets per day.  Place wedge under hand if wrist range of motion is limited.  Marland Kitchen

## 2021-08-11 NOTE — Therapy (Signed)
[facility] [address] [place], [place], [address] Phone: [phone]   Fax:  [phone]  Physical Therapy Treatment  Patient Details  Name: Justin Mason MRN: [id number] Date of Birth: 08-Mar-1981 Referring Provider (PT): Frann Rider, NP   Encounter Date: 08/11/2021   PT End of Session - 08/11/21 2043     Visit Number 4    Number of Visits 17    Date for PT Re-Evaluation 08/15/21    Authorization Type [employer]    PT Start Time 0934    PT Stop Time 1016    PT Time Calculation (min) 42 min    Activity Tolerance Patient tolerated treatment well    Behavior During Therapy [facility] for tasks assessed/performed             Past Medical History:  Diagnosis Date   AKI (acute kidney injury) ([place])    Arthritis    Cardiac murmur 05/24/2020   Cellulitis 01/03/2015   Cellulitis of right leg    Cellulitis of right lower extremity 01/03/2015   Essential hypertension    GERD (gastroesophageal reflux disease)    High cholesterol    Hyperlipemia    Hypertension    Morbid obesity ([place]) 05/24/2020   OSA (obstructive sleep apnea)    Rotator cuff tear, right 09/08/2011   Sepsis ([place]) 01/03/2015   Stroke ([place])    Type 2 diabetes mellitus with hyperglycemia ([place])     Past Surgical History:  Procedure Laterality Date   INCISION AND DRAINAGE FOOT  1991-1992   STEPPED ON NAIL / INFECTED WOUND   IR ANGIO INTRA EXTRACRAN SEL COM CAROTID INNOMINATE BILAT MOD SED  07/24/2020   IR ANGIO VERTEBRAL SEL SUBCLAVIAN INNOMINATE UNI L MOD SED  07/24/2020   IR ANGIO VERTEBRAL SEL VERTEBRAL UNI R MOD SED  07/24/2020   RADIOLOGY WITH ANESTHESIA N/A 07/24/2020   Procedure: IR WITH ANESTHESIA - CODE STROKE;  Surgeon: Radiologist, Medication, MD;  Location: [facility];  Service: Radiology;  Laterality: N/A;   SHOULDER SURGERY     September 08, 2011    There were no vitals filed for this visit.   Subjective Assessment - 08/11/21 0937     Subjective Pt  reports using either walker or cane at home all the time - is not walking any without a device at this time    Pertinent History HTN, morbid obesity, OSA, Lt CVA on 07-24-20, acute kidney injury, expressive aphasia, chronic pain bil. knees, type 2 DM, h/o Rt rotator cuff sx 09/2011, possible recent siezure    Patient Stated Goals walk with the cane - not have to use the rollator    Currently in Pain? No/denies                               [facility] Adult PT Treatment/Exercise - 08/11/21 0001       Transfers   Transfers Sit to Stand;Stand to Sit    Sit to Stand 5: Supervision    Number of Reps Other reps (comment)   3 reps   Comments needs LUE support due to Lt ACL tear - pt reports some discomfort in knee with sit to stand      Ambulation/Gait   Ambulation/Gait Yes    Ambulation/Gait Assistance 4: Min guard    Ambulation/Gait Assistance Details gait trained inside // bars without UE support    Ambulation Distance (Feet) 60 Feet  Assistive device None    Gait Pattern Step-through pattern    Ambulation Surface Level;Indoor      Knee/Hip Exercises: Standing   Hip Flexion Stengthening;Right;Left;2 sets;10 reps;Knee bent;Knee straight   red theraband   Hip Abduction Stengthening;Right;Left;1 set;10 reps;Knee straight   red theraband   Hip Extension Stengthening;Right;Left;1 set;10 reps;Knee straight   with red theraband     Knee/Hip Exercises: Seated   Long Arc Quad Right;1 set;Strengthening;10 reps   red theraband - 3 sec hold                Balance Exercises - 08/11/21 0001       Balance Exercises: Standing   Sidestepping 2 reps   inside // bars   Turning Right;Left;Limitations   some imbalance - pt used UE support for stability   Other Standing Exercises tap ups to 6" step 5 reps each foot with UE support on // bars prn    Other Standing Exercises Comments Alternating forward and side kicks 5 reps each leg without UE support on bars                 PT Education - 08/11/21 2041     Education Details See pt instructions - added strengthening exercises for Rt hip musc. - pt was given red theraband for PRE's    Person(s) Educated Patient;Parent(s)    Methods Explanation;Demonstration;Handout    Comprehension Verbalized understanding;Returned demonstration              PT Short Term Goals - 08/11/21 2053       PT SHORT TERM GOAL #1   Title Pt will amb. 43' with SBQC with CGA on flat, even surface for increased community accessibility.    Baseline 115' with SBQC on 07-29-21    Time 4    Period Weeks    Status Partially Met    Target Date 07/18/21      PT SHORT TERM GOAL #2   Title Increase gait velocity from 1.52 ft/sec to >/= 1.9 ft/sec with use of rollator for increased gait efficiency.    Baseline 21.62 secs = 1.52 ft/sec with rollator;    Time 4    Period Weeks    Status On-going    Target Date 07/18/21      PT SHORT TERM GOAL #3   Title Improve TUG score from 31.19 secs with rollator to </= 26 secs with rollator for reduced fall risk.    Baseline 33.75    Time 4    Period Weeks    Status On-going    Target Date 07/18/21      PT SHORT TERM GOAL #4   Title Pt will participate in [place] assessment.    Baseline due to limited visits - time was focused on HEP instruction    Time 4    Period Weeks    Status Deferred    Target Date 07/18/21      PT SHORT TERM GOAL #5   Title Negotiate steps with use of 1 handrail and SBQC with min to CGA.    Baseline met 07-29-21    Time 4    Period Weeks    Status Achieved    Target Date 07/18/21               PT Long Term Goals - 08/11/21 2053       PT LONG TERM GOAL #1   Title Pt will be modified independent without device for household ambulation.  Time 8    Period Weeks    Status New    Target Date 08/15/21      PT LONG TERM GOAL #2   Title Pt will amb. 500' with SBQC on outdoor paved surfaces with supervision for increased community accessibiity.     Time 8    Period Weeks    Status New    Target Date 08/15/21      PT LONG TERM GOAL #3   Title Negotiate 4 steps with quad cane without use of hand rail with supervision.    Time 8    Period Weeks    Status New    Target Date 08/15/21      PT LONG TERM GOAL #4   Title Improve TUG score to </= 23 secs with SBQC for reduced fall risk.    Baseline 31.19 secs with rollator on 06-16-21    Time 8    Period Weeks    Status New    Target Date 08/15/21      PT LONG TERM GOAL #5   Title Improve Berg score by at least 8 points to reduce fall risk.    Time 8    Period Weeks    Status New    Target Date 08/15/21      PT LONG TERM GOAL #6   Title Independent in HEP for balance and strengthening exs.    Time 8    Period Weeks    Status New    Target Date 08/15/21                   Plan - 08/11/21 2044     Clinical Impression Statement Session focused on gait training with decreasing UE support, with pt gait trained inside // bars without UE support.  Pt has slight limp when amb. without UE support, appears to be due to Rt quad weakness and possibly decreased confidence and security with RLE SLS.  Added PRE's for Rt hip strengthening to decrease gait deivations and to improve balance. Cont with POC.    Personal Factors and Comorbidities Comorbidity 2;Time since onset of injury/illness/exacerbation;Past/Current Experience;Fitness;Profession    Comorbidities HTN, morbid obesity, OSA, Lt CVA with Rt spastic hemiplegia 07-24-20, espressive aphasia, chronic pain bil. knees, type 2 DM, h/o Rt rotator cuff repair 09/2011, possible recent seizure    Examination-Activity Limitations Bed Mobility;Locomotion Level;Transfers;Squat;Bend;Stairs;Toileting    Examination-Participation Restrictions Occupation;Community Activity;Interpersonal Relationship;Laundry;Shop;Meal Prep;Yard Work;Cleaning    PT Treatment/Interventions ADLs/Self Care Home Management;DME Instruction;Gait training;Stair  training;Therapeutic activities;Therapeutic exercise;Balance training;Neuromuscular re-education;Patient/family education    PT Next Visit Plan check HEP with red band - dynamic gait and standing balance with SPC    PT Home Exercise Plan see pt instructions - Balance HEP    Consulted and Agree with Plan of Care Patient;Family member/caregiver    Family Member Consulted mother [name]             Patient will benefit from skilled therapeutic intervention in order to improve the following deficits and impairments:  Abnormal gait, Decreased balance, Decreased activity tolerance, Decreased coordination, Decreased strength, Impaired tone, Impaired UE functional use, Obesity  Visit Diagnosis: Other abnormalities of gait and mobility  Unsteadiness on feet  Spastic hemiplegia of right dominant side as late effect of cerebral infarction [facility])     Problem List Patient Active Problem List   Diagnosis Date Noted   Primary osteoarthritis of left knee 11/07/2020   Gout flare 09/06/2020   Type 2 diabetes mellitus ([place]) 09/06/2020  Chronic pain of left knee    Expressive aphasia    Chronic pain of right knee    Right elbow pain    Acute blood loss anemia    Right hemiparesis ([facility])    Right hemiplegia (H[facility])    Hyponatremia    AKI (acute kidney injury) ([place])    Benign essential HTN    Pressure injury of skin 08/09/2020   Dyslipidemia    Controlled type 2 diabetes mellitus with hyperglycemia, without long-term current use of insulin ([place])    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 [place], [place], [address] Phone: [phone]   Fax:  [phone]  Name: DEONDRICK SEARLS MRN: [id number] Date of Birth: 08-18-1980

## 2021-08-11 NOTE — Therapy (Signed)
[facility] [address] [place], [place], [address] Phone: [phone]   Fax:  [phone]  Speech Language Pathology Treatment  Patient Details  Name: Justin Mason MRN: [id number] Date of Birth: 07-18-1980 Referring Provider (SLP): Ihor Austin NP   Encounter Date: 08/11/2021   End of Session - 08/11/21 1159     Visit Number 5    Number of Visits 25    Date for SLP Re-Evaluation 09/12/21    Authorization Type [employer] - 30 visit combined limit; $40 copay per discipline    Authorization Time Period mom is keeping track - today was total of 11 visits for all combined    SLP Start Time 1100    SLP Stop Time  1145    SLP Time Calculation (min) 45 min    Activity Tolerance Patient tolerated treatment well             Past Medical History:  Diagnosis Date   AKI (acute kidney injury) ([facility])    Arthritis    Cardiac murmur 05/24/2020   Cellulitis 01/03/2015   Cellulitis of right leg    Cellulitis of right lower extremity 01/03/2015   Essential hypertension    GERD (gastroesophageal reflux disease)    High cholesterol    Hyperlipemia    Hypertension    Morbid obesity ([facility]) 05/24/2020   OSA (obstructive sleep apnea)    Rotator cuff tear, right 09/08/2011   Sepsis ([facility]) 01/03/2015   Stroke ([facility])    Type 2 diabetes mellitus with hyperglycemia ([facility])     Past Surgical History:  Procedure Laterality Date   INCISION AND DRAINAGE FOOT  1991-1992   STEPPED ON NAIL / INFECTED WOUND   IR ANGIO INTRA EXTRACRAN SEL COM CAROTID INNOMINATE BILAT MOD SED  07/24/2020   IR ANGIO VERTEBRAL SEL SUBCLAVIAN INNOMINATE UNI L MOD SED  07/24/2020   IR ANGIO VERTEBRAL SEL VERTEBRAL UNI R MOD SED  07/24/2020   RADIOLOGY WITH ANESTHESIA N/A 07/24/2020   Procedure: IR WITH ANESTHESIA - CODE STROKE;  Surgeon: Radiologist, Medication, MD;  [facility];  Service: Radiology;  Laterality: N/A;   SHOULDER SURGERY     September 08, 2011     There were no vitals filed for this visit.   Subjective Assessment - 08/11/21 1102     Subjective [place]    Patient is accompained by: Family member   mom   Currently in Pain? No/denies                   ADULT SLP TREATMENT - 08/11/21 1103       General Information   Behavior/Cognition Alert;Cooperative;Pleasant mood      Treatment Provided   Treatment provided Cognitive-Linquistic      Cognitive-Linquistic Treatment   Treatment focused on Aphasia;Patient/family/caregiver education    Skilled Treatment 3 pages of HW complete, both sentence generation with A from mom. Targeted sentence generation and word finding generating 2 sentences for multpile meaning words. Halil generated 20 sentences with consistent extended time and frequent questioning cues. He also required cues to vary sentences and change from "I like...." for each sentence. Targeted verbal compensations for aphasia generating 3 similarities and differences for 3 releated pairs (coke/coffee; police/fire; tractor/combine) Isak required extended time, semantic cues and questioning cues to complete task. Mom trained on appropriate cueing for word finding and verbal compensations      Assessment / Recommendations / Plan   Plan Continue  with current plan of care   reduce 1x a week due to visit limits     Progression Toward Goals   Progression toward goals Progressing toward goals              SLP Education - 08/11/21 1156     Education Details cueing for [name] to use verbal compensations    Person(s) Educated Patient;Parent(s)    Methods Explanation;Demonstration;Verbal cues    Comprehension Returned demonstration;Verbalized understanding;Verbal cues required;Need further instruction              SLP Short Term Goals - 08/11/21 1158       SLP SHORT TERM GOAL #1   Title Pt will name 10 items in personally relevant category with rare min A over 2 sessions    Time 2    Period Weeks     Status On-going      SLP SHORT TERM GOAL #2   Title Pt will generate simple sentences in structured tasks with rare min A 9/10 opportunitites    Time 2    Period Weeks    Status On-going      SLP SHORT TERM GOAL #3   Title Pt and family will implement 2 multimodal communication strategies to  support pt's auditory comprehension to report 25% subjectively reduced frustration with communication at home.    Time 2    Period Weeks    Status On-going      SLP SHORT TERM GOAL #4   Title Pt will ID and self correct errors in text or written communication 8/10x with usual min A    Time 2    Period Weeks    Status On-going      SLP SHORT TERM GOAL #5   Title Pt will use verbal or gestural compensations for aphasia as needed 8/10x on structured naming tasks with occasional min A    Time 2    Period Weeks    Status On-going              SLP Long Term Goals - 08/11/21 1158       SLP LONG TERM GOAL #1   Title Pt will comprehend 4 turns in simple conversation with no requests for repetition over 3 sessions    Time 10    Period Weeks    Status On-going      SLP LONG TERM GOAL #2   Title In 5 minute simple conversation, Renardo will successfully use multimodal compensations for aphasia as needed 3/5x with occasional min A    Time 10    Period Weeks    Status On-going      SLP LONG TERM GOAL #3   Title Pt will write complex sentences in structured task, such as VNeST, ID and self correct 8/10 errors with occasional min A    Time 10    Period Weeks    Status On-going      SLP LONG TERM GOAL #4   Title Pt will improve score on Cognitive Participation Bank - Short Form, by 3 points (original score 4/30)    Time 10    Period Weeks    Status On-going              Plan - 08/11/21 1157     Speech Therapy Frequency 1x /week   reducing to 1x a week due to visit limits   Duration 12 weeks    Treatment/Interventions Language facilitation;Environmental controls;Cueing  hierarchy;SLP instruction and feedback;Compensatory strategies;Functional  tasks;Cognitive reorganization;Compensatory techniques;Patient/family education;Multimodal communcation approach;Internal/external aids    Potential to Achieve Goals Good    Potential Considerations Financial resources;Severity of impairments             Patient will benefit from skilled therapeutic intervention in order to improve the following deficits and impairments:   Aphasia    Problem List Patient Active Problem List   Diagnosis Date Noted   Primary osteoarthritis of left knee 11/07/2020   Gout flare 09/06/2020   Type 2 diabetes mellitus ([facility]) 09/06/2020   Chronic pain of left knee    Expressive aphasia    Chronic pain of right knee    Right elbow pain    Acute blood loss anemia    Right hemiparesis ([facility])    Right hemiplegia ([facility])    Hyponatremia    AKI (acute kidney injury) ([facility])    Benign essential HTN    Pressure injury of skin 08/09/2020   Dyslipidemia    Controlled type 2 diabetes mellitus with hyperglycemia, without long-term current use of insulin ([facility])    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   Middle cerebral artery stenosis, 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   Morbid obesity ([facility]) 05/24/2020   Cardiac murmur 05/24/2020   Arthritis    High cholesterol    Hyperlipemia    Type 2 diabetes mellitus with hyperglycemia ([facility])    Cellulitis 01/03/2015   Cellulitis of right lower extremity 01/03/2015   Sepsis (HCC) 01/03/2015   Hypertension    GERD (gastroesophageal reflux disease)    Essential hypertension    Cellulitis of right leg    Rotator cuff tear, right 09/08/2011    Jamon Hayhurst, Radene Journey, CCC-SLP 08/11/2021, 12:00 PM  [facility] [address] [place], [place], [address] Phone: [phone]   Fax:  [phone]   Name: FARRELL PANTALEO MRN: [id number] Date of Birth: Jul 11, 1980

## 2021-08-11 NOTE — Therapy (Signed)
[facility] [address] [place], [place], [address] Phone: [phone]   Fax:  [phone]  Occupational Therapy Treatment  Patient Details  Name: Justin Mason MRN: [id number] Date of Birth: 03-17-1981 Referring Provider (OT): Frann Rider, NP   Encounter Date: 08/11/2021   OT End of Session - 08/11/21 1059     Visit Number 5    Number of Visits 25    Date for OT Re-Evaluation 09/14/21    Authorization Type [employer] - $40 copay per discipline. visit limit: 30 total visits for all therapies combined    OT Start Time 1015    OT Stop Time 1058    OT Time Calculation (min) 43 min    Activity Tolerance Patient tolerated treatment well    Behavior During Therapy WFL for tasks assessed/performed             Past Medical History:  Diagnosis Date   AKI (acute kidney injury) ([facility])    Arthritis    Cardiac murmur 05/24/2020   Cellulitis 01/03/2015   Cellulitis of right leg    Cellulitis of right lower extremity 01/03/2015   Essential hypertension    GERD (gastroesophageal reflux disease)    High cholesterol    Hyperlipemia    Hypertension    Morbid obesity ([facility]) 05/24/2020   OSA (obstructive sleep apnea)    Rotator cuff tear, right 09/08/2011   Sepsis ([place]) 01/03/2015   Stroke ([place])    Type 2 diabetes mellitus with hyperglycemia ([facility])     Past Surgical History:  Procedure Laterality Date   INCISION AND DRAINAGE FOOT  1991-1992   STEPPED ON NAIL / INFECTED WOUND   IR ANGIO INTRA EXTRACRAN SEL COM CAROTID INNOMINATE BILAT MOD SED  07/24/2020   IR ANGIO VERTEBRAL SEL SUBCLAVIAN INNOMINATE UNI L MOD SED  07/24/2020   IR ANGIO VERTEBRAL SEL VERTEBRAL UNI R MOD SED  07/24/2020   RADIOLOGY WITH ANESTHESIA N/A 07/24/2020   Procedure: IR WITH ANESTHESIA - CODE STROKE;  Surgeon: Radiologist, Medication, MD;  Location: [facility];  Service: Radiology;  Laterality: N/A;   SHOULDER SURGERY     September 08, 2011    There  were no vitals filed for this visit.   Subjective Assessment - 08/11/21 1020     Patient is accompanied by: Family member    Pertinent History Spastic hemiplegia of R side as late effect of cerebral infarction 07/24/20.     PMH:  obesity, HTN, hyperlipidemia, arthritis, DM, OSA, hx of R RTC tear 09/2011, possible recent seizure    Limitations possible seizure hx    Patient Stated Goals be able to use R hand    Currently in Pain? No/denies            Pt reports splint for thumb is going well. Pt arrived wearing thumb splint and Oval 8 finger splint.   Pt issued wt bearing HEP - see pt instructions for details. Pt performed each with min cues  BUE AA/ROM in low range sh flexion (towards floor and gravity elim plane)  Practiced low level reaching to grasp/release small cones RUE w/ HOH assist from Lt hand                      OT Education - 08/11/21 1041     Education Details wt bearing HEP    Person(s) Educated Patient;Parent(s)    Methods Explanation;Demonstration;Handout;Verbal cues;Tactile cues    Comprehension Verbalized understanding;Returned demonstration;Tactile  cues required              OT Short Term Goals - 07/31/21 1120       OT SHORT TERM GOAL #1   Title Pt will be independent with initial HEP.--check STGs 07/17/21    Time 4    Period Weeks    Status On-going      OT SHORT TERM GOAL #2   Title Pt will be independent with splint wear/care for improved R hand positioning.    Time 4    Period Weeks    Status New      OT SHORT TERM GOAL #3   Title Pt demo at least 30* active R shoulder flex for functional reach.    Time 4    Period Weeks    Status New      OT SHORT TERM GOAL #4   Title Pt will be able to consistently use RUE as stabilizer/gross assist for simple tasks.    Time 4    Period Weeks    Status New      OT SHORT TERM GOAL #5   Title Pt will be able to perform low-level reach to grasp/release small cylinder object without  assist.    Time 4    Period Weeks    Status New               OT Long Term Goals - 07/31/21 1120       OT LONG TERM GOAL #1   Title Pt will be independent with updated HEP.--check LTGs 09/14/21    Time 12    Period Weeks    Status New      OT LONG TERM GOAL #2   Title Pt will perform BADLs mod I.    Time 12    Period Weeks    Status New      OT LONG TERM GOAL #3   Title Pt will be able to perform simple snack prep mod I.    Time 12    Period Weeks    Status New      OT LONG TERM GOAL #4   Title Pt will perform simple home maintenance task mod I.    Time 12    Period Weeks    Status New      OT LONG TERM GOAL #5   Title Pt will demo at least 45* R shoulder flex for active reach.    Time 12    Period Weeks    Status New      OT LONG TERM GOAL #6   Title Pt will improve RUE reach/coordination for ADLs as shown by improve score on box and blocks test to 8.    Baseline R-1 lbock    Time 12    Period Weeks    Status New                   Plan - 08/11/21 1100     Clinical Impression Statement Pt progressing towards goals. Limited by insurance visit limit    OT Occupational Profile and History Detailed Assessment- Review of Records and additional review of physical, cognitive, psychosocial history related to current functional performance    Occupational performance deficits (Please refer to evaluation for details): ADL's;IADL's;Work;Leisure    Body Structure / Function / Physical Skills ADL;Decreased knowledge of use of DME;Strength;Tone;GMC;Dexterity;Balance;Body mechanics;UE functional use;ROM;IADL;Mobility;Coordination;FMC    Cognitive Skills Memory;Understand    Rehab Potential Good    Clinical  Decision Making Several treatment options, min-mod task modification necessary    Comorbidities Affecting Occupational Performance: Presence of comorbidities impacting occupational performance    Comorbidities impacting occupational performance description:  hx or R RTC tear with prior limitations, possible seizure hx, arthritis, obesity    Modification or Assistance to Complete Evaluation  Min-Moderate modification of tasks or assist with assess necessary to complete eval    OT Frequency 2x / week    OT Duration 12 weeks    OT Treatment/Interventions Self-care/ADL training;Moist Heat;DME and/or AE instruction;Fluidtherapy;Splinting;Balance training;Therapeutic activities;Ultrasound;Therapeutic exercise;Cognitive remediation/compensation;Passive range of motion;Functional Mobility Training;Neuromuscular education;Cryotherapy;Electrical Stimulation;Energy conservation;Manual Therapy;Paraffin;Patient/family education    Plan Pt/family has reduced down to 1x/wk due to insurance visit limit, continue NMR RUE and functional reaching, review wt bearing HEP prn    Consulted and Agree with Plan of Care Patient;Family member/caregiver    Family Member Consulted mother             Patient will benefit from skilled therapeutic intervention in order to improve the following deficits and impairments:   Body Structure / Function / Physical Skills: ADL, Decreased knowledge of use of DME, Strength, Tone, GMC, Dexterity, Balance, Body mechanics, UE functional use, ROM, IADL, Mobility, Coordination, [facility] Cognitive Skills: Memory, Understand     Visit Diagnosis: Hemiplegia and hemiparesis following cerebral infarction affecting right dominant side [facility])    Problem List Patient Active Problem List   Diagnosis Date Noted   Primary osteoarthritis of left knee 11/07/2020   Gout flare 09/06/2020   Type 2 diabetes mellitus ([facility]) 09/06/2020   Chronic pain of left knee    Expressive aphasia    Chronic pain of right knee    Right elbow pain    Acute blood loss anemia    Right hemiparesis ([facility])    Right hemiplegia ([facility])    Hyponatremia    AKI (acute kidney injury) ([place])    Benign essential HTN    Pressure injury of skin 08/09/2020   Dyslipidemia     Controlled type 2 diabetes mellitus with hyperglycemia, without long-term current use of insulin ([facility])    Stroke (cerebrum) ([place]) 07/24/2020   Middle cerebral artery stenosis, 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   Morbid obesity ([place]) 05/24/2020   Cardiac murmur 05/24/2020   Arthritis    High cholesterol    Hyperlipemia    Type 2 diabetes mellitus with hyperglycemia ([facility])    Cellulitis 01/03/2015   Cellulitis of right lower extremity 01/03/2015   Sepsis ([place]) 01/03/2015   Hypertension    GERD (gastroesophageal reflux disease)    Essential hypertension    Cellulitis of right leg    Rotator cuff tear, right 09/08/2011    Carey Bullocks, OTR/L 08/11/2021, 12:04 PM  [facility] [address] [place], [place], [address] Phone: [phone]   Fax:  [phone]  Name: DEACON NICOLOSI MRN: [id number] Date of Birth: [birth year]

## 2021-08-14 ENCOUNTER — Ambulatory Visit: Payer: BC Managed Care – PPO | Admitting: Speech Pathology

## 2021-08-14 ENCOUNTER — Ambulatory Visit: Payer: BC Managed Care – PPO | Admitting: Physical Therapy

## 2021-08-14 ENCOUNTER — Ambulatory Visit: Payer: BC Managed Care – PPO | Admitting: Occupational Therapy

## 2021-08-19 ENCOUNTER — Ambulatory Visit: Payer: BC Managed Care – PPO | Admitting: Occupational Therapy

## 2021-08-19 ENCOUNTER — Ambulatory Visit: Payer: BC Managed Care – PPO | Admitting: Physical Therapy

## 2021-08-19 ENCOUNTER — Ambulatory Visit: Payer: BC Managed Care – PPO

## 2021-08-19 ENCOUNTER — Other Ambulatory Visit: Payer: Self-pay

## 2021-08-19 DIAGNOSIS — R2689 Other abnormalities of gait and mobility: Secondary | ICD-10-CM | POA: Diagnosis not present

## 2021-08-19 DIAGNOSIS — I69351 Hemiplegia and hemiparesis following cerebral infarction affecting right dominant side: Secondary | ICD-10-CM

## 2021-08-19 DIAGNOSIS — R4701 Aphasia: Secondary | ICD-10-CM

## 2021-08-19 DIAGNOSIS — R2681 Unsteadiness on feet: Secondary | ICD-10-CM

## 2021-08-19 NOTE — Therapy (Signed)
[facility] [address] [place], [place], [address] Phone: [phone]   Fax:  [phone]  Speech Language Pathology Treatment  Patient Details  Name: Justin Mason MRN: [id number] Date of Birth: 06/25/1981 Referring Provider (SLP): Frann Rider NP   Encounter Date: 08/19/2021   End of Session - 08/19/21 0930     Visit Number 6    Number of Visits 25    Date for SLP Re-Evaluation 09/12/21    Authorization Type [employer] - 30 visit limit; $40 copay per discipline    Authorization Time Period mom is keeping track - today was total of 14 vists for all combined    SLP Start Time 0930    SLP Stop Time  1015    SLP Time Calculation (min) 45 min    Activity Tolerance Patient tolerated treatment well             Past Medical History:  Diagnosis Date   AKI (acute kidney injury) ([place] [facility])    Arthritis    Cardiac murmur 05/24/2020   Cellulitis 01/03/2015   Cellulitis of right leg    Cellulitis of right lower extremity 01/03/2015   Essential hypertension    GERD (gastroesophageal reflux disease)    High cholesterol    Hyperlipemia    Hypertension    Morbid obesity ([place]) 05/24/2020   OSA (obstructive sleep apnea)    Rotator cuff tear, right 09/08/2011   Sepsis ([place]) 01/03/2015   Stroke ([place])    Type 2 diabetes mellitus with hyperglycemia ([facility])     Past Surgical History:  Procedure Laterality Date   INCISION AND DRAINAGE FOOT  1991-1992   STEPPED ON NAIL / INFECTED WOUND   IR ANGIO INTRA EXTRACRAN SEL COM CAROTID INNOMINATE BILAT MOD SED  07/24/2020   IR ANGIO VERTEBRAL SEL SUBCLAVIAN INNOMINATE UNI L MOD SED  07/24/2020   IR ANGIO VERTEBRAL SEL VERTEBRAL UNI R MOD SED  07/24/2020   RADIOLOGY WITH ANESTHESIA N/A 07/24/2020   Procedure: IR WITH ANESTHESIA - CODE STROKE;  Surgeon: Radiologist, Medication, MD;  Location: [facility];  Service: Radiology;  Laterality: N/A;   SHOULDER SURGERY     September 08, 2011    There  were no vitals filed for this visit.   Subjective Assessment - 08/19/21 0932     Subjective "alright"    Currently in Pain? No/denies                   ADULT SLP TREATMENT - 08/19/21 0930       General Information   Behavior/Cognition Alert;Cooperative;Pleasant mood      Treatment Provided   Treatment provided Cognitive-Linquistic      Cognitive-Linquistic Treatment   Treatment focused on Aphasia;Apraxia    Skilled Treatment HEP intermittently completed. Mother reported alternation of tasks if patient becomes frustrated. Usual prompting required to initiate word finding and increase MLU >1-2 words in conversation of personally relevant topics. Mother provided appropriate phonemic and semantic cues to aid word finding. SLP targeted sentence generation re: topics of personal interests. Max cues required to initate sentence with varied sentence starters. Pt able to write majority of content words with intermittent min to mod A. SLP provided A to include function words (articles, prepositions).      Assessment / Recommendations / Plan   Plan Continue with current plan of care      Progression Toward Goals   Progression toward goals Progressing toward goals  SLP Education - 08/19/21 1130     Education Details HEP, target functional communication    Person(s) Educated Patient;Parent(s)    Methods Explanation;Demonstration;Verbal cues;Handout    Comprehension Verbalized understanding;Returned demonstration;Verbal cues required;Need further instruction              SLP Short Term Goals - 08/19/21 1003       SLP SHORT TERM GOAL #1   Title Pt will name 10 items in personally relevant category with rare min A over 2 sessions    Time 1    Period Weeks    Status On-going      SLP SHORT TERM GOAL #2   Title Pt will generate simple sentences in structured tasks with rare min A 9/10 opportunitites    Time 1    Period Weeks    Status On-going      SLP  SHORT TERM GOAL #3   Title Pt and family will implement 2 multimodal communication strategies to  support pt's auditory comprehension to report 25% subjectively reduced frustration with communication at home.    Time 1    Period Weeks    Status On-going      SLP SHORT TERM GOAL #4   Title Pt will ID and self correct errors in text or written communication 8/10x with usual min A    Time 1    Period Weeks    Status On-going      SLP SHORT TERM GOAL #5   Title Pt will use verbal or gestural compensations for aphasia as needed 8/10x on structured naming tasks with occasional min A    Time 1    Period Weeks    Status On-going              SLP Long Term Goals - 08/19/21 1003       SLP LONG TERM GOAL #1   Title Pt will comprehend 4 turns in simple conversation with no requests for repetition over 3 sessions    Time 9    Period Weeks    Status On-going      SLP LONG TERM GOAL #2   Title In 5 minute simple conversation, K[place]hawn will successfully use multimodal compensations for aphasia as needed 3/5x with occasional min A    Time 9    Period Weeks    Status On-going      SLP LONG TERM GOAL #3   Title Pt will write complex sentences in structured task, such as VNeST, ID and self correct 8/10 errors with occasional min A    Time 9    Period Weeks    Status On-going      SLP LONG TERM GOAL #4   Title Pt will improve score on Cognitive Participation Bank - Short Form, by 3 points (original score 4/30)    Time 9    Period Weeks    Status On-going              Plan - 08/19/21 1131     Clinical Impression Statement 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 Ongoing training in verbal and non verbal compensations for aphasia, HEP for verbal apraxia and word finding, sentence generation, as well as training mother in cueing strategies. Continue skilled ST to maximize communication for safety, reduce caregiver burden, and improve life participation.    Speech  Therapy Frequency 1x /week   reducing to 1x a week due to visit limits   Duration 12 weeks  Treatment/Interventions Language facilitation;Environmental Environmental consultant;Internal/external aids;Compensatory techniques;Functional tasks;Cognitive reorganization;Compensatory strategies;Patient/family education;Multimodal communcation approach    Potential to Achieve Goals Good    Potential Considerations Financial resources;Severity of impairments             Patient will benefit from skilled therapeutic intervention in order to improve the following deficits and impairments:   Aphasia    Problem List Patient Active Problem List   Diagnosis Date Noted   Primary osteoarthritis of left knee 11/07/2020   Gout flare 09/06/2020   Type 2 diabetes mellitus ([place] [facility]) 09/06/2020   Chronic pain of left knee    Expressive aphasia    Chronic pain of right knee    Right elbow pain    Acute blood loss anemia    Right hemiparesis ([facility])    Right hemiplegia ([facility])    Hyponatremia    AKI (acute kidney injury) ([facility])    Benign essential HTN    Pressure injury of skin 08/09/2020   Dyslipidemia    Controlled type 2 diabetes mellitus with hyperglycemia, without long-term current use of insulin ([facility])    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   Middle cerebral artery stenosis, 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   Morbid obesity ([place]) 05/24/2020   Cardiac murmur 05/24/2020   Arthritis    High cholesterol    Hyperlipemia    Type 2 diabetes mellitus with hyperglycemia ([facility])    Cellulitis 01/03/2015   Cellulitis of right lower extremity 01/03/2015   Sepsis ([place]) 01/03/2015   Hypertension    GERD (gastroesophageal reflux disease)    Essential hypertension    Cellulitis of right leg    Rotator cuff tear, right 09/08/2011    Alinda Deem, CCC-SLP 08/19/2021, 11:35 AM  [facility] [address] [place], [place], [address] Phone: [phone]   Fax:   [phone]   Name: STYLEZ FUNDORA MRN: [id number] Date of Birth: [birth year]

## 2021-08-19 NOTE — Therapy (Signed)
[facility] [address] [place], [place], [address] Phone: [phone]   Fax:  [phone]  Occupational Therapy Treatment  Patient Details  Name: Justin Mason MRN: [id number] Date of Birth: 1981-03-24 Referring Provider (OT): Frann Rider, NP   Encounter Date: 08/19/2021   OT End of Session - 08/19/21 1121     Visit Number 6    Number of Visits 25    Date for OT Re-Evaluation 09/14/21    Authorization Type [employer] - $40 copay per discipline. visit limit: 30 total visits for all therapies combined    OT Start Time 1015    OT Stop Time 1100    OT Time Calculation (min) 45 min    Activity Tolerance Patient tolerated treatment well    Behavior During Therapy WFL for tasks assessed/performed             Past Medical History:  Diagnosis Date   AKI (acute kidney injury) ([place])    Arthritis    Cardiac murmur 05/24/2020   Cellulitis 01/03/2015   Cellulitis of right leg    Cellulitis of right lower extremity 01/03/2015   Essential hypertension    GERD (gastroesophageal reflux disease)    High cholesterol    Hyperlipemia    Hypertension    Morbid obesity ([place]) 05/24/2020   OSA (obstructive sleep apnea)    Rotator cuff tear, right 09/08/2011   Sepsis ([place]) 01/03/2015   Stroke ([place])    Type 2 diabetes mellitus with hyperglycemia ([place])     Past Surgical History:  Procedure Laterality Date   INCISION AND DRAINAGE FOOT  1991-1992   STEPPED ON NAIL / INFECTED WOUND   IR ANGIO INTRA EXTRACRAN SEL COM CAROTID INNOMINATE BILAT MOD SED  07/24/2020   IR ANGIO VERTEBRAL SEL SUBCLAVIAN INNOMINATE UNI L MOD SED  07/24/2020   IR ANGIO VERTEBRAL SEL VERTEBRAL UNI R MOD SED  07/24/2020   RADIOLOGY WITH ANESTHESIA N/A 07/24/2020   Procedure: IR WITH ANESTHESIA - CODE STROKE;  Surgeon: Radiologist, Medication, MD;  Location: [facility];  Service: Radiology;  Laterality: N/A;   SHOULDER SURGERY     September 08, 2011    There  were no vitals filed for this visit.   Subjective Assessment - 08/19/21 1028     Subjective  I need some adjustments to the splint    Patient is accompanied by: Family member    Pertinent History Spastic hemiplegia of R side as late effect of cerebral infarction 07/24/20.     PMH:  obesity, HTN, hyperlipidemia, arthritis, DM, OSA, hx of R RTC tear 09/2011, possible recent seizure    Limitations possible seizure hx    Patient Stated Goals be able to use R hand    Currently in Pain? No/denies             Pt/mother reports some rubbing and mild skin peeling where splint hits dorsally on thumb. No blister present but potential for one. Pt issued finger stockinette for thumb to see if this will prevent further skin breakdown (if not, will consider fabricating new splint). Pt also needed new strap and hook replaced today.  Seated: reviewed wt bearing HEP for NMR RUE.  BUE AA/ROM sh flexion into gravity and then in gravity elim plane. RUE closed chain flexion with UE Ranger in gravity elim sh flexion and horizontal abd.  Pt maintaining RUE in 80* closed chain flexion while performing body on arm movements.   Supine: chest press  using PVC frame w/ mod assist RUE. Followed by P/ROM in sh flexion to approx 100*                       OT Short Term Goals - 08/19/21 1121       OT SHORT TERM GOAL #1   Title Pt will be independent with initial HEP.--check STGs 07/17/21    Time 4    Period Weeks    Status Achieved      OT SHORT TERM GOAL #2   Title Pt will be independent with splint wear/care for improved R hand positioning.    Time 4    Period Weeks    Status On-going   met, however may need different kind d/t rubbing on thumb     OT SHORT TERM GOAL #3   Title Pt demo at least 30* active R shoulder flex for functional reach.    Time 4    Period Weeks    Status On-going      OT SHORT TERM GOAL #4   Title Pt will be able to consistently use RUE as stabilizer/gross  assist for simple tasks.    Time 4    Period Weeks    Status On-going      OT SHORT TERM GOAL #5   Title Pt will be able to perform low-level reach to grasp/release small cylinder object without assist.    Time 4    Period Weeks    Status New               OT Long Term Goals - 07/31/21 1120       OT LONG TERM GOAL #1   Title Pt will be independent with updated HEP.--check LTGs 09/14/21    Time 12    Period Weeks    Status New      OT LONG TERM GOAL #2   Title Pt will perform BADLs mod I.    Time 12    Period Weeks    Status New      OT LONG TERM GOAL #3   Title Pt will be able to perform simple snack prep mod I.    Time 12    Period Weeks    Status New      OT LONG TERM GOAL #4   Title Pt will perform simple home maintenance task mod I.    Time 12    Period Weeks    Status New      OT LONG TERM GOAL #5   Title Pt will demo at least 45* R shoulder flex for active reach.    Time 12    Period Weeks    Status New      OT LONG TERM GOAL #6   Title Pt will improve RUE reach/coordination for ADLs as shown by improve score on box and blocks test to 8.    Baseline R-1 lbock    Time 12    Period Weeks    Status New                   Plan - 08/19/21 1122     Clinical Impression Statement Pt progressing towards goals. Limited by insurance visit limit    OT Occupational Profile and History Detailed Assessment- Review of Records and additional review of physical, cognitive, psychosocial history related to current functional performance    Occupational performance deficits (Please refer to evaluation for details): ADL's;IADL's;Work;Leisure  Body Structure / Function / Physical Skills ADL;Decreased knowledge of use of DME;Strength;Tone;GMC;Dexterity;Balance;Body mechanics;UE functional use;ROM;IADL;Mobility;Coordination;FMC    Cognitive Skills Memory;Understand    Rehab Potential Good    Clinical Decision Making Several treatment options, min-mod task  modification necessary    Comorbidities Affecting Occupational Performance: Presence of comorbidities impacting occupational performance    Comorbidities impacting occupational performance description: hx or R RTC tear with prior limitations, possible seizure hx, arthritis, obesity    Modification or Assistance to Complete Evaluation  Min-Moderate modification of tasks or assist with assess necessary to complete eval    OT Frequency 2x / week    OT Duration 12 weeks    OT Treatment/Interventions Self-care/ADL training;Moist Heat;DME and/or AE instruction;Fluidtherapy;Splinting;Balance training;Therapeutic activities;Ultrasound;Therapeutic exercise;Cognitive remediation/compensation;Passive range of motion;Functional Mobility Training;Neuromuscular education;Cryotherapy;Electrical Stimulation;Energy conservation;Manual Therapy;Paraffin;Patient/family education    Plan assess splint and fabricate different style if needed, begin assessing STG's    Consulted and Agree with Plan of Care Patient;Family member/caregiver    Family Member Consulted mother             Patient will benefit from skilled therapeutic intervention in order to improve the following deficits and impairments:   Body Structure / Function / Physical Skills: ADL, Decreased knowledge of use of DME, Strength, Tone, GMC, Dexterity, Balance, Body mechanics, UE functional use, ROM, IADL, Mobility, Coordination, [facility] Cognitive Skills: Memory, Understand     Visit Diagnosis: Hemiplegia and hemiparesis following cerebral infarction affecting right dominant side [facility])    Problem List Patient Active Problem List   Diagnosis Date Noted   Primary osteoarthritis of left knee 11/07/2020   Gout flare 09/06/2020   Type 2 diabetes mellitus ([place]) 09/06/2020   Chronic pain of left knee    Expressive aphasia    Chronic pain of right knee    Right elbow pain    Acute blood loss anemia    Right hemiparesis (HCC)    Right hemiplegia  (HCC)    Hyponatremia    AKI (acute kidney injury) ([place])    Benign essential HTN    Pressure injury of skin 08/09/2020   Dyslipidemia    Controlled type 2 diabetes mellitus with hyperglycemia, without long-term current use of insulin ([facility])    Stroke (cerebrum) ([place]) 07/24/2020   Middle cerebral artery stenosis, 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   Morbid obesity ([place]) 05/24/2020   Cardiac murmur 05/24/2020   Arthritis    High cholesterol    Hyperlipemia    Type 2 diabetes mellitus with hyperglycemia (HCC)    Cellulitis 01/03/2015   Cellulitis of right lower extremity 01/03/2015   Sepsis ([place]) 01/03/2015   Hypertension    GERD (gastroesophageal reflux disease)    Essential hypertension    Cellulitis of right leg    Rotator cuff tear, right 09/08/2011    Carey Bullocks, OTR/L 08/19/2021, 11:23 AM  [facility] [address] [place], [place], [address] Phone: [phone]   Fax:  [phone]  Name: Justin Mason MRN: [id number] Date of Birth: 02-12-1981

## 2021-08-19 NOTE — Patient Instructions (Addendum)
Practice ordering when you go to restaurants: Ex: I would like a double quarter pounder with fries and a Coke.       Write 5-10 sentences about your favorite things (tractor pulls, hunting, favorite foods)

## 2021-08-20 ENCOUNTER — Encounter: Payer: Self-pay | Admitting: Physical Therapy

## 2021-08-20 NOTE — Therapy (Signed)
[facility] [address] [place], [place], [address] Phone: [phone]   Fax:  [phone]  Physical Therapy Treatment  Patient Details  Name: Justin Mason MRN: [id number] Date of Birth: 07/10/1980 Referring Provider (PT): Frann Rider, NP   Encounter Date: 08/19/2021   PT End of Session - 08/20/21 1126     Visit Number 5    Number of Visits 17    Date for PT Re-Evaluation 09/19/21    Authorization Type [employer]    PT Start Time 1106    PT Stop Time 1147    PT Time Calculation (min) 41 min    Activity Tolerance Patient tolerated treatment well    Behavior During Therapy [facility] for tasks assessed/performed             Past Medical History:  Diagnosis Date   AKI (acute kidney injury) ([place])    Arthritis    Cardiac murmur 05/24/2020   Cellulitis 01/03/2015   Cellulitis of right leg    Cellulitis of right lower extremity 01/03/2015   Essential hypertension    GERD (gastroesophageal reflux disease)    High cholesterol    Hyperlipemia    Hypertension    Morbid obesity ([place]) 05/24/2020   OSA (obstructive sleep apnea)    Rotator cuff tear, right 09/08/2011   Sepsis ([place]) 01/03/2015   Stroke ([place])    Type 2 diabetes mellitus with hyperglycemia ([place])     Past Surgical History:  Procedure Laterality Date   INCISION AND DRAINAGE FOOT  1991-1992   STEPPED ON NAIL / INFECTED WOUND   IR ANGIO INTRA EXTRACRAN SEL COM CAROTID INNOMINATE BILAT MOD SED  07/24/2020   IR ANGIO VERTEBRAL SEL SUBCLAVIAN INNOMINATE UNI L MOD SED  07/24/2020   IR ANGIO VERTEBRAL SEL VERTEBRAL UNI R MOD SED  07/24/2020   RADIOLOGY WITH ANESTHESIA N/A 07/24/2020   Procedure: IR WITH ANESTHESIA - CODE STROKE;  Surgeon: Radiologist, Medication, MD;  Location: [facility];  Service: Radiology;  Laterality: N/A;   SHOULDER SURGERY     September 08, 2011    There were no vitals filed for this visit.   Subjective Assessment - 08/20/21 1115     Subjective Pt  reports he is doing OK - continues to use cane or RW at home; is not amb. without device (due to imbalance and LLE weakness)    Pertinent History HTN, morbid obesity, OSA, Lt CVA on 07-24-20, acute kidney injury, expressive aphasia, chronic pain bil. knees, type 2 DM, h/o Rt rotator cuff sx 09/2011, possible recent siezure    Patient Stated Goals walk with the cane - not have to use the rollator    Currently in Pain? No/denies                               [facility] Adult PT Treatment/Exercise - 08/20/21 0001       Transfers   Transfers Sit to Stand;Stand to Sit    Sit to Stand 5: Supervision    Number of Reps Other reps (comment)   2   Comments LUE support used from high/low mat table      Ambulation/Gait   Ambulation/Gait Yes    Ambulation/Gait Assistance 4: Min guard    Ambulation/Gait Assistance Details gait trained inside // bars 10' x 2 without UE support on rails; one occurrence of mild Lt knee instability - pt able to recover balance without UE  support independently    Ambulation Distance (Feet) 100 Feet    Assistive device Small based quad cane    Gait Pattern Step-through pattern    Ambulation Surface Level;Indoor    Stairs Yes    Stairs Assistance 5: Supervision    Stairs Assistance Details (indicate cue type and reason) leads with RLE;  descends with LLE first    Stair Management Technique Forwards;Step to pattern;One rail Right;With cane    Number of Stairs 4    Height of Stairs 6            TUG score 31.84 secs with [facility]     Balance Exercises - 08/20/21 0001       Balance Exercises: Standing   Stepping Strategy Anterior;Lateral;5 reps   min UE support   Sidestepping 2 reps   inside // bars; no UE support   Turning Right;Left;Other (comment)   2 reps each side - min UE support used for stability   Step Over Hurdles / Cones stepping over balance beam 5 reps each leg with UE support prn inside // bars    Other Standing Exercises tap ups to 6"  step 5 reps each foot with UE support on // bars prn                  PT Short Term Goals - 08/20/21 1126       PT SHORT TERM GOAL #1   Title Pt will amb. 75' with SBQC with CGA on flat, even surface for increased community accessibility.    Baseline 115' with SBQC on 07-29-21    Time 4    Period Weeks    Status Partially Met    Target Date 07/18/21      PT SHORT TERM GOAL #2   Title Increase gait velocity from 1.52 ft/sec to >/= 1.9 ft/sec with use of rollator for increased gait efficiency.    Baseline 21.62 secs = 1.52 ft/sec with rollator;    Time 4    Period Weeks    Status On-going    Target Date 07/18/21      PT SHORT TERM GOAL #3   Title Improve TUG score from 31.19 secs with rollator to </= 26 secs with rollator for reduced fall risk.    Baseline 33.75    Time 4    Period Weeks    Status On-going    Target Date 07/18/21      PT SHORT TERM GOAL #4   Title Pt will participate in [place] assessment.    Baseline due to limited visits - time was focused on HEP instruction    Time 4    Period Weeks    Status Deferred    Target Date 07/18/21      PT SHORT TERM GOAL #5   Title Negotiate steps with use of 1 handrail and SBQC with min to CGA.    Baseline met 07-29-21    Time 4    Period Weeks    Status Achieved    Target Date 07/18/21               PT Long Term Goals - 08/19/21 1112       PT LONG TERM GOAL #1   Title Pt will be modified independent without device for household ambulation.    Time 4    Period Weeks    Status On-going    Target Date 09/19/21      PT LONG TERM GOAL #2  Title Pt will amb. 500' with [facility] on outdoor paved surfaces with supervision for increased community accessibiity.    Baseline met per pt report    Time 8    Period Weeks    Status Achieved    Target Date 08/15/21      PT LONG TERM GOAL #3   Title Negotiate 4 steps with quad cane without use of hand rail with supervision.    Time 4    Period Weeks    Status  On-going    Target Date 09/19/21      PT LONG TERM GOAL #4   Title Improve TUG score to </= 23 secs with SBQC for reduced fall risk.    Baseline 31.19 secs with rollator on 06-16-21;   31.84 secs with SBQC - 08-19-21    Time 4    Period Weeks    Status On-going    Target Date 09/19/21      PT LONG TERM GOAL #5   Title Improve Berg score by at least 8 points to reduce fall risk.    Baseline Berg balance test deferred (due to limited visits authorized)    Time 8    Period Weeks    Status Deferred    Target Date 08/15/21      PT LONG TERM GOAL #6   Title Independent in HEP for balance and strengthening exs.    Time 8    Period Weeks    Status Achieved    Target Date 08/15/21                   Plan - 08/20/21 1133     Clinical Impression Statement Pt has met LTG's #2 and #6:  LTG #1 not met as pt is using SBQC for assistance with household amb. and with short community distance amb.- pt is not ambulating independently without device at this time due to balance impairments and continued RLE weakness.  LTG #3 is ongoing as pt needs rail for safety with step negotiation.  LTG #4 is not met as TUG score = 31.84 secs with SBQC (was 31.19 secs with rollator at initial eval in Dec. 2022). LTG #5 - Berg balance test- is deferred due to limited number of authorized visits.  Cont with POC.    Personal Factors and Comorbidities Comorbidity 2;Time since onset of injury/illness/exacerbation;Past/Current Experience;Fitness;Profession    Comorbidities HTN, morbid obesity, OSA, Lt CVA with Rt spastic hemiplegia 07-24-20, espressive aphasia, chronic pain bil. knees, type 2 DM, h/o Rt rotator cuff repair 09/2011, possible recent seizure    Examination-Activity Limitations Bed Mobility;Locomotion Level;Transfers;Squat;Bend;Stairs;Toileting    Examination-Participation Restrictions Occupation;Community Activity;Interpersonal Relationship;Laundry;Shop;Meal Prep;Yard Work;Cleaning    PT Frequency 1x /  week    PT Duration 4 weeks    PT Treatment/Interventions ADLs/Self Care Home Management;DME Instruction;Gait training;Stair training;Therapeutic activities;Therapeutic exercise;Balance training;Neuromuscular re-education;Patient/family education    PT Next Visit Plan check HEP with red band - dynamic gait and standing balance with SPC    PT Home Exercise Plan see pt instructions - Balance HEP    Consulted and Agree with Plan of Care Patient;Family member/caregiver    Family Member Consulted mother [name]             Patient will benefit from skilled therapeutic intervention in order to improve the following deficits and impairments:  Abnormal gait, Decreased balance, Decreased activity tolerance, Decreased coordination, Decreased strength, Impaired tone, Impaired UE functional use, Obesity  Visit Diagnosis: Hemiplegia and hemiparesis following cerebral infarction  affecting right dominant side ([place]) - Plan: PT plan of care cert/re-cert  Other abnormalities of gait and mobility - Plan: PT plan of care cert/re-cert  Unsteadiness on feet - Plan: PT plan of care cert/re-cert     Problem List Patient Active Problem List   Diagnosis Date Noted   Primary osteoarthritis of left knee 11/07/2020   Gout flare 09/06/2020   Type 2 diabetes mellitus ([place]) 09/06/2020   Chronic pain of left knee    Expressive aphasia    Chronic pain of right knee    Right elbow pain    Acute blood loss anemia    Right hemiparesis (HCC)    Right hemiplegia (HCC)    Hyponatremia    AKI (acute kidney injury) ([place])    Benign essential HTN    Pressure injury of skin 08/09/2020   Dyslipidemia    Controlled type 2 diabetes mellitus with hyperglycemia, without long-term current use of insulin ([place])    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   Middle cerebral artery stenosis, 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   Morbid obesity ([place]) 05/24/2020   Cardiac murmur 05/24/2020   Arthritis    High cholesterol    Hyperlipemia    Type 2  diabetes mellitus with hyperglycemia ([place])    Cellulitis 01/03/2015   Cellulitis of right lower extremity 01/03/2015   Sepsis ([place]) 01/03/2015   Hypertension    GERD (gastroesophageal reflux disease)    Essential hypertension    Cellulitis of right leg    Rotator cuff tear, right 09/08/2011    Alda Lea, PT 08/20/2021, 11:48 AM  [facility] [address] [place], [place], [address] Phone: [phone]   Fax:  [phone]  Name: Justin Mason MRN: [id number] Date of Birth: 03/19/1981

## 2021-08-21 ENCOUNTER — Encounter: Payer: Self-pay | Admitting: Occupational Therapy

## 2021-08-21 ENCOUNTER — Ambulatory Visit: Payer: Self-pay | Admitting: Physical Therapy

## 2021-08-21 ENCOUNTER — Encounter: Payer: Self-pay | Admitting: Speech Pathology

## 2021-08-26 ENCOUNTER — Ambulatory Visit: Payer: BC Managed Care – PPO

## 2021-08-26 ENCOUNTER — Ambulatory Visit: Payer: BC Managed Care – PPO | Admitting: Physical Therapy

## 2021-08-26 ENCOUNTER — Ambulatory Visit: Payer: BC Managed Care – PPO | Admitting: Occupational Therapy

## 2021-08-27 DIAGNOSIS — Z0289 Encounter for other administrative examinations: Secondary | ICD-10-CM

## 2021-08-28 ENCOUNTER — Encounter: Payer: Self-pay | Admitting: Occupational Therapy

## 2021-08-28 ENCOUNTER — Encounter: Payer: Self-pay | Admitting: Speech Pathology

## 2021-08-28 ENCOUNTER — Ambulatory Visit: Payer: Self-pay | Admitting: Physical Therapy

## 2021-09-02 ENCOUNTER — Ambulatory Visit: Payer: BC Managed Care – PPO

## 2021-09-02 ENCOUNTER — Ambulatory Visit: Payer: BC Managed Care – PPO | Admitting: Occupational Therapy

## 2021-09-02 ENCOUNTER — Other Ambulatory Visit: Payer: Self-pay | Admitting: Cardiology

## 2021-09-08 ENCOUNTER — Encounter (HOSPITAL_COMMUNITY): Payer: Self-pay

## 2021-09-08 ENCOUNTER — Other Ambulatory Visit: Payer: Self-pay

## 2021-09-08 ENCOUNTER — Emergency Department (HOSPITAL_COMMUNITY): Payer: BC Managed Care – PPO

## 2021-09-08 ENCOUNTER — Emergency Department (HOSPITAL_COMMUNITY)
Admission: EM | Admit: 2021-09-08 | Discharge: 2021-09-08 | Disposition: A | Discharge status: Home / Self Care | Payer: BC Managed Care – PPO | Attending: Emergency Medicine | Admitting: Emergency Medicine

## 2021-09-08 DIAGNOSIS — R55 Syncope and collapse: Secondary | ICD-10-CM | POA: Diagnosis not present

## 2021-09-08 DIAGNOSIS — E860 Dehydration: Secondary | ICD-10-CM | POA: Insufficient documentation

## 2021-09-08 DIAGNOSIS — R569 Unspecified convulsions: Secondary | ICD-10-CM | POA: Diagnosis not present

## 2021-09-08 DIAGNOSIS — R5383 Other fatigue: Secondary | ICD-10-CM | POA: Diagnosis not present

## 2021-09-08 DIAGNOSIS — D72829 Elevated white blood cell count, unspecified: Secondary | ICD-10-CM | POA: Diagnosis not present

## 2021-09-08 DIAGNOSIS — R718 Other abnormality of red blood cells: Secondary | ICD-10-CM | POA: Insufficient documentation

## 2021-09-08 LAB — COMPREHENSIVE METABOLIC PANEL
ALT: 29 U/L (ref 0–44)
AST: 21 U/L (ref 15–41)
Albumin: 4.1 g/dL (ref 3.5–5.0)
Alkaline Phosphatase: 64 U/L (ref 38–126)
Anion gap: 11 (ref 5–15)
BUN: 12 mg/dL (ref 6–20)
CO2: 30 mmol/L (ref 22–32)
Calcium: 9.6 mg/dL (ref 8.9–10.3)
Chloride: 97 mmol/L — ABNORMAL LOW (ref 98–111)
Creatinine, Ser: 1.13 mg/dL (ref 0.61–1.24)
GFR, Estimated: >60 mL/min (ref >60)
Glucose, Bld: 105 mg/dL — ABNORMAL HIGH (ref 70–99)
Potassium: 3.4 mmol/L — ABNORMAL LOW (ref 3.5–5.1)
Sodium: 138 mmol/L (ref 135–145)
Total Bilirubin: 1 mg/dL (ref 0.3–1.2)
Total Protein: 7.3 g/dL (ref 6.5–8.1)

## 2021-09-08 LAB — CBC
HCT: 53.3 % — ABNORMAL HIGH (ref 39.0–52.0)
Hemoglobin: 17.8 g/dL — ABNORMAL HIGH (ref 13.0–17.0)
MCH: 30 pg (ref 26.0–34.0)
MCHC: 33.4 g/dL (ref 30.0–36.0)
MCV: 89.7 fL (ref 80.0–100.0)
Platelets: 221 K/uL (ref 150–400)
RBC: 5.94 MIL/uL — ABNORMAL HIGH (ref 4.22–5.81)
RDW: 13 % (ref 11.5–15.5)
WBC: 12.8 K/uL — ABNORMAL HIGH (ref 4.0–10.5)
nRBC: 0 % (ref 0.0–0.2)

## 2021-09-08 LAB — CBG MONITORING, ED: Glucose-Capillary: 106 mg/dL — ABNORMAL HIGH (ref 70–99)

## 2021-09-08 IMAGING — CT CT HEAD W/O CM
4 series · 16 of 47 positions shown, 18 images · non-contrast
Comparison: [DATE].

CLINICAL DATA: Seizure, new-onset, no history of trauma



[Series 3: head without · axial · non-contrast · 0.46mm/px · z∈[+292,+422]mm · 7 of 36 slices shown, 9 images]
[im 5/36  brain]
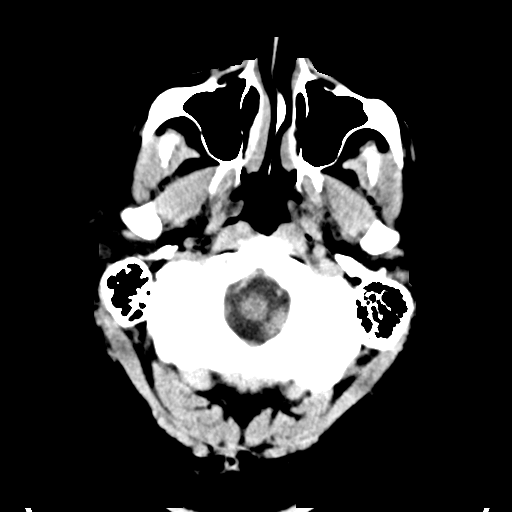
[im 5/36  bone]
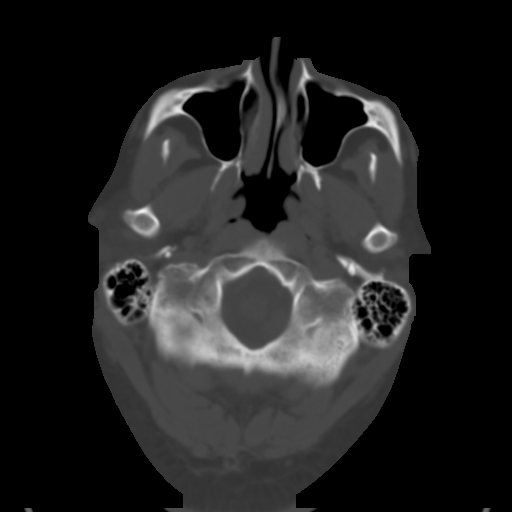
[im 9/36  brain]
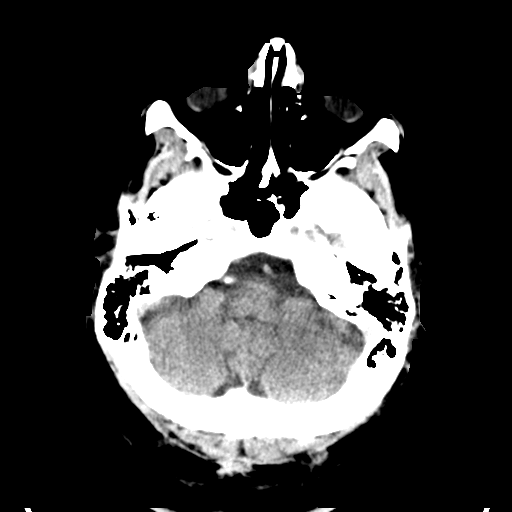
[im 14/36  brain]
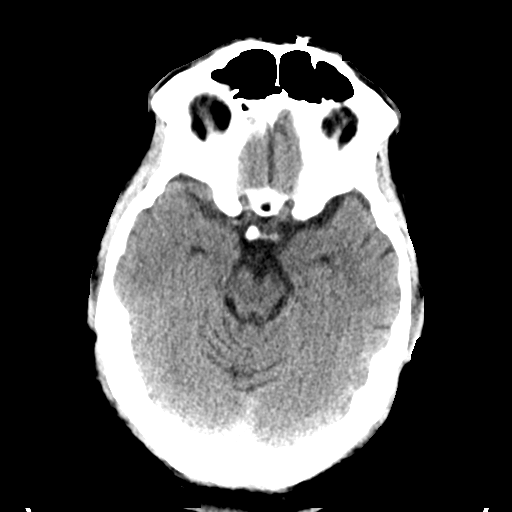
[im 18/36  brain]
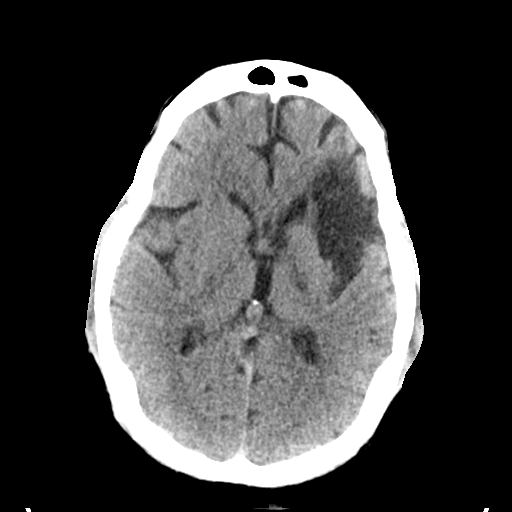
[im 22/36  brain]
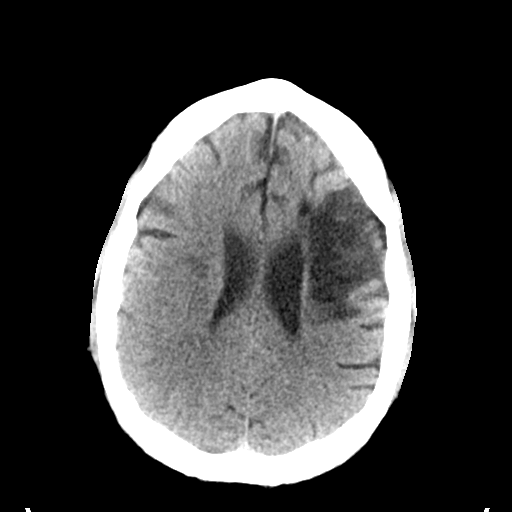
[im 22/36  bone]
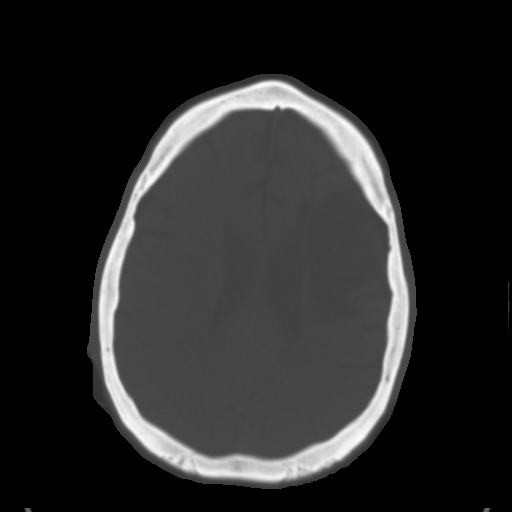
[im 27/36  brain]
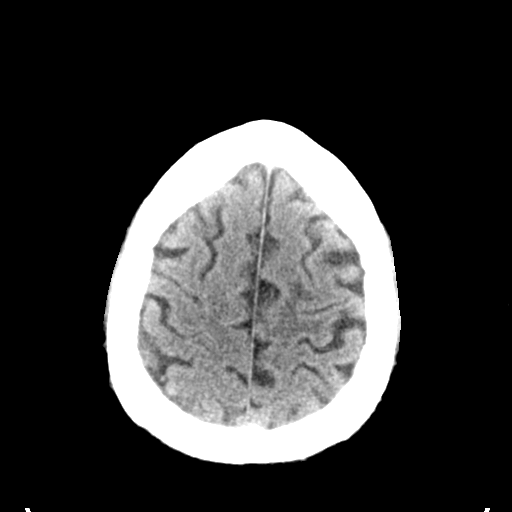
[im 31/36  brain]
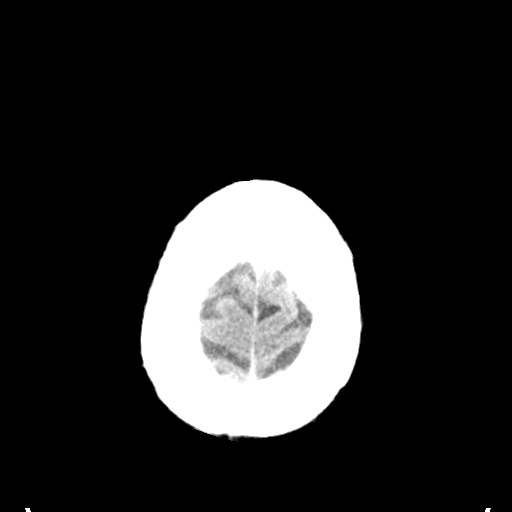

[Series 4: head bone · axial · 0.46mm/px · z∈[+288,+324]mm · 3 of 88 slices shown]
[im 9/88  bone]
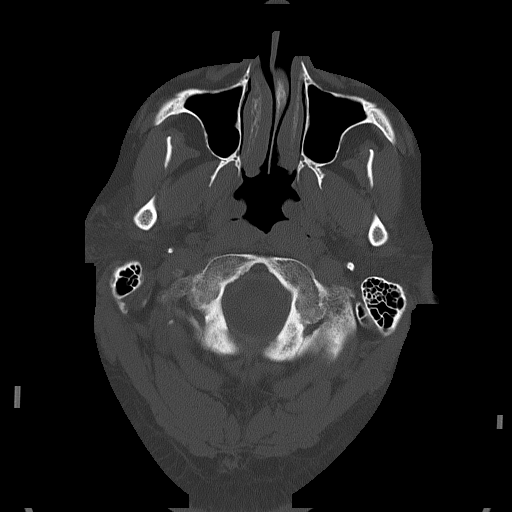
[im 18/88  bone]
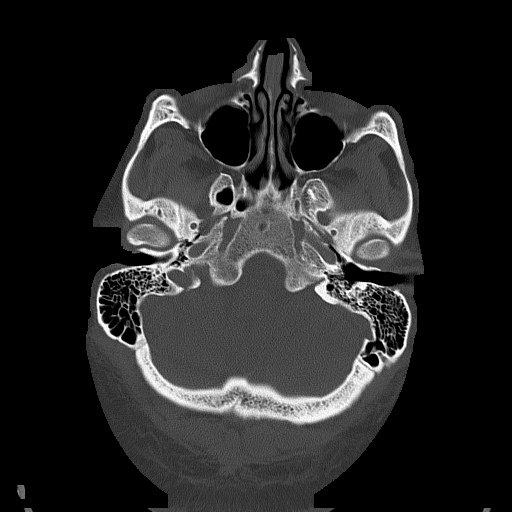
[im 27/88  bone]
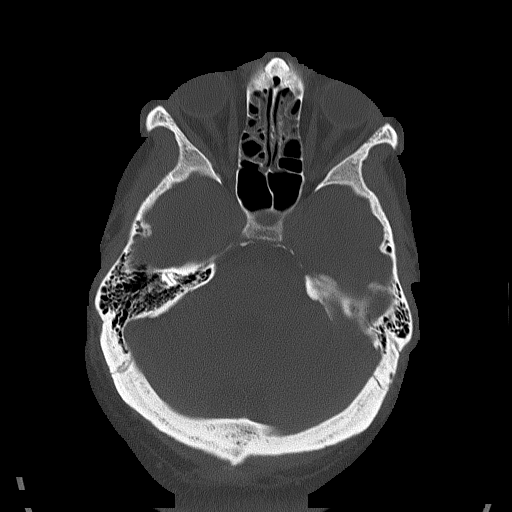

[Series 5: head without cor · coronal · non-contrast · 0.34mm/px · 3 of 71 slices shown]
[im 25/71  brain]
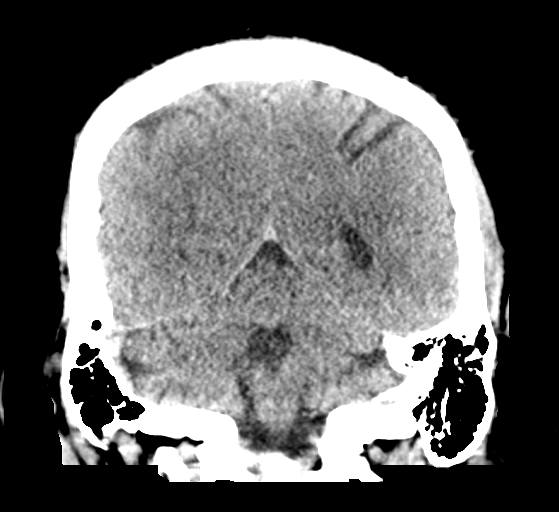
[im 32/71  brain]
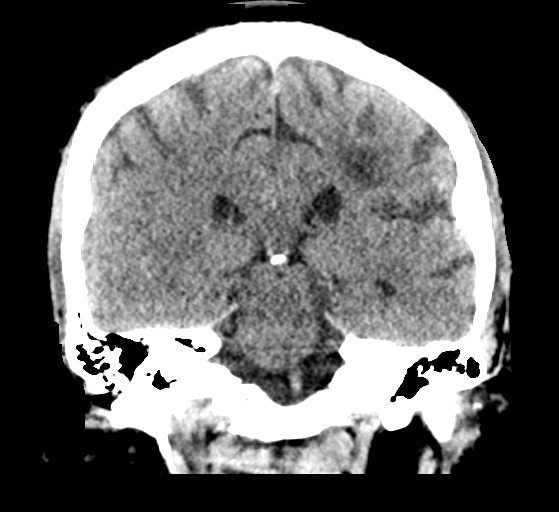
[im 39/71  brain]
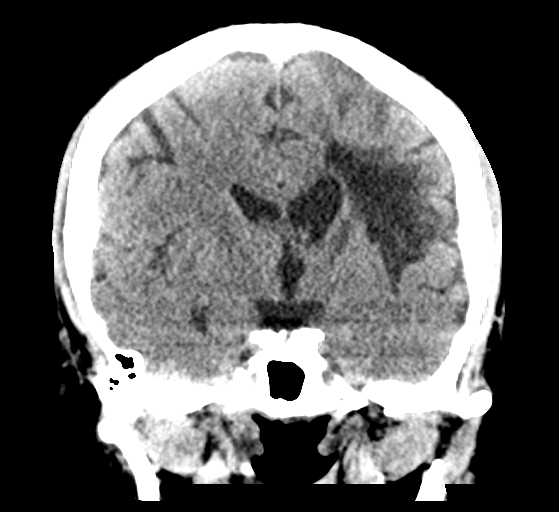

[Series 6: head without sag · sagittal · non-contrast · 0.34mm/px · 3 of 52 slices shown]
[im 18/52  brain]
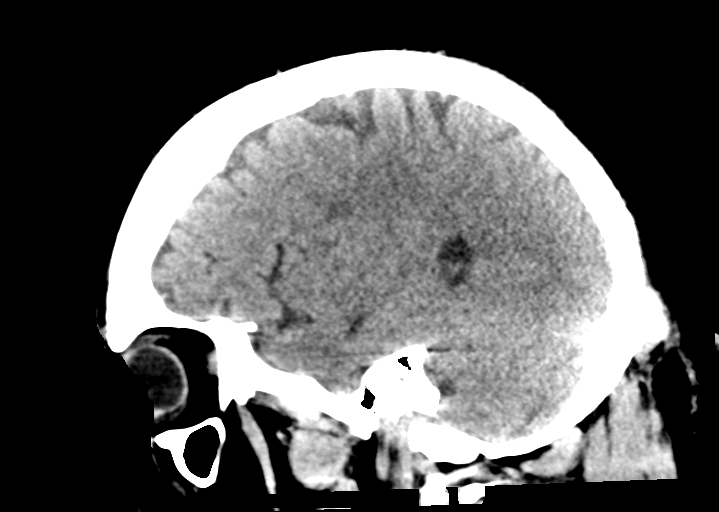
[im 26/52  brain]
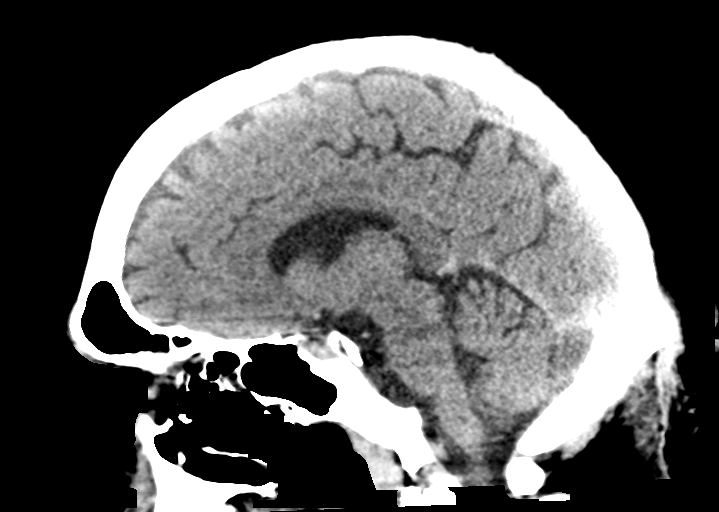
[im 35/52  brain]
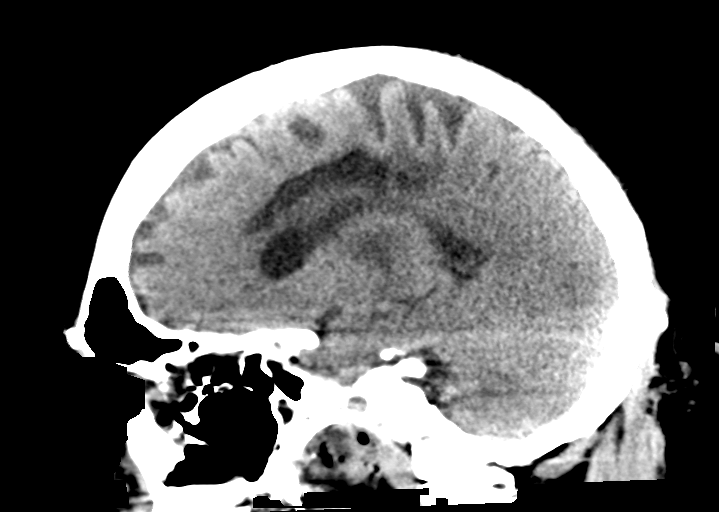

[16 of 47 positions shown; findings below may reference images not displayed]

FINDINGS: Brain: Continued evolution of prior left MCA territory infarct with
encephalomalacia.No definite new/acute infarct by CT. No acute
hemorrhage or abnormal mass effect. Mild hypodensity within the
posterior limb of the left internal capsule and left midbrain likely
represents well Wallerian degeneration. No mass lesion, midline
shift, or hydrocephalus. Partially empty sella.

Vascular: No hyperdense vessel identified.

Skull: No acute fracture.

Sinuses/Orbits: Ethmoid air cell mucosal thickening. Unremarkable
orbits.

Other: No mastoid effusions.
IMPRESSION: 1. No evidence of acute intracranial abnormality.
2. Evolution of prior left MCA territory infarct with
encephalomalacia.

## 2021-09-08 MED ORDER — SODIUM CHLORIDE 0.9 % IV BOLUS
1000.0000 mL | Freq: Once | INTRAVENOUS | Status: AC
  Administered 2021-09-08: 1000 mL via INTRAVENOUS

## 2021-09-08 MED ORDER — LEVETIRACETAM IN NACL 1000 MG/100ML IV SOLN
1000.0000 mg | Freq: Once | INTRAVENOUS | Status: AC
  Administered 2021-09-08: 1000 mg via INTRAVENOUS
  Filled 2021-09-08: qty 100

## 2021-09-08 MED ORDER — LEVETIRACETAM 750 MG PO TABS: 750.0000 mg | ORAL_TABLET | Freq: Two times a day (BID) | ORAL | 1 refills | Status: DC

## 2021-09-08 NOTE — ED Provider Notes (Signed)
[facility] EMERGENCY DEPARTMENT Provider Note   CSN: [id number] Arrival date & time: 09/08/21  1614     History  Chief Complaint  Patient presents with   Seizures   Loss of Consciousness    Justin Mason is a 41 y.o. male.   Seizures Loss of Consciousness Associated symptoms: seizures    This patient is a very pleasant 41 year old male who unfortunately had a stroke in January 2022 which left him with right upper extremity hemiparesis as well as some difficulty with expressive aphasia.  He is currently undergoing occupational and physical therapy for such things.  Evidently the patient has had 2 discrete seizure-like episodes over the last year, he had seen neurology in December most recently, had an EEG that was reportedly normal and was told that he needed an MRI however this has been cost prohibitive up to this point.  Today he was at the doctor's office getting a blood draw because of 2 weeks of generalized fatigue according to the medical record office visit notes which accompany the patient.  The paramedics report he had a normal blood sugar, they report that there was no further seizure activity in route to the hospital however when he was at the office this afternoon the patient had some witnessed loss of consciousness after a blood draw, it is not clear whether this happened immediately during the blood draw or sometime after he had some type of loss of consciousness with seizure activity which resolved spontaneously.  No medications were given prehospital, the patient has no recollection of this though he does remember having a blood draw today.  The mother, [name], is in route to the hospital at this time.  Home Medications Prior to Admission medications   Medication Sig Start Date End Date Taking? Authorizing Provider  levETIRAcetam (KEPPRA) 750 MG tablet Take 1 tablet (750 mg total) by mouth 2 (two) times daily. 09/08/21 10/08/21 Yes Eber Hong, MD   acetaminophen (TYLENOL) 325 MG tablet Take 1-2 tablets (325-650 mg total) by mouth every 4 (four) hours as needed for mild pain. 09/04/20   Love, Evlyn Kanner, PA-C  allopurinol (ZYLOPRIM) 100 MG tablet Take 100 mg by mouth as needed (gout). 05/22/20   [provider]  Ascorbic Acid (VITAMIN C PO) Take 1 tablet by mouth every evening.    [provider]  baclofen (LIORESAL) 10 MG tablet TAKE 1 TABLET BY MOUTH THREE TIMES DAILY 06/03/21   Raulkar, Drema Pry, MD  chlorthalidone (HYGROTON) 25 MG tablet TAKE 1 TABLET BY MOUTH DAILY 09/02/21   [place], Peter M, MD  Cholecalciferol (VITAMIN D-3) 25 MCG (1000 UT) CAPS Take 1,000 Units by mouth every evening.    [provider]  colchicine 0.6 MG tablet Take 1 tablet (0.6 mg total) by mouth daily. Patient taking differently: Take 0.6 mg by mouth as needed. 09/06/20   Love, Evlyn Kanner, PA-C  dapagliflozin propanediol (FARXIGA) 10 MG TABS tablet Take 1 tablet (10 mg total) by mouth daily. 09/06/20   Love, Evlyn Kanner, PA-C  diclofenac Sodium (VOLTAREN) 1 % GEL Apply 2 g topically 4 (four) times daily. 09/06/20   Love, Evlyn Kanner, PA-C  gabapentin (NEURONTIN) 300 MG capsule TAKE 1 CAPSULE BY MOUTH THREE TIMES DAILY Patient taking differently: 2 (two) times daily. 02/04/21   Horton Chin, MD  glimepiride (AMARYL) 2 MG tablet Take by mouth. 01/28/21   [provider]  hydrALAZINE (APRESOLINE) 50 MG tablet Take 1 tablet (50 mg total) by  mouth 2 (two) times daily. 04/14/21 04/09/22  [place], Peter M, MD  irbesartan (AVAPRO) 300 MG tablet Take 1 tablet (300 mg total) by mouth daily. 12/05/20   Azalee CourseMeng, Hao, PA  metFORMIN (GLUCOPHAGE-XR) 500 MG 24 hr tablet Take 500 mg by mouth 2 (two) times daily. 02/06/21   [provider]  metoprolol tartrate (LOPRESSOR) 100 MG tablet Take 1 tablet (100 mg total) by mouth 2 (two) times daily. 04/14/21 04/09/22  [place], Peter M, MD  Multiple Vitamin (MULTIVITAMIN WITH MINERALS) TABS tablet Take 1 tablet by mouth  daily. 08/09/20   Azucena FallenLancaster, William C, MD  pantoprazole (PROTONIX) 40 MG tablet TAKE 1 TABLET (40 MG TOTAL) BY MOUTH DAILY. 09/06/20 09/06/21  Love, Evlyn KannerPamela S, PA-C  rosuvastatin (CRESTOR) 10 MG tablet Take 1 tablet (10 mg total) by mouth daily. 09/06/20   Love, Evlyn KannerPamela S, PA-C  sertraline (ZOLOFT) 25 MG tablet TAKE 1 TABLET (25 MG TOTAL) BY MOUTH DAILY. 09/06/20 09/06/21  Jacquelynn CreeLove, Pamela S, PA-C  zinc gluconate 50 MG tablet Take 50 mg by mouth daily.    [provider]      Allergies    Patient has no known allergies.    Review of Systems   Review of Systems  Cardiovascular:  Positive for syncope.  Neurological:  Positive for seizures.  All other systems reviewed and are negative.  Physical Exam Updated Vital Signs BP 113/70    Pulse (!) 56    Temp 97.8 F (36.6 C) (Oral)    Resp 16    Ht 1.854 m (6\' 1" )    Wt (!) 142.6 kg    SpO2 96%    BMI 41.48 kg/m  Physical Exam Vitals and nursing note reviewed.  Constitutional:      General: He is not in acute distress.    Appearance: He is well-developed.  HENT:     Head: Normocephalic and atraumatic.     Mouth/Throat:     Pharynx: No oropharyngeal exudate.  Eyes:     General: No scleral icterus.       Right eye: No discharge.        Left eye: No discharge.     Conjunctiva/sclera: Conjunctivae normal.     Pupils: Pupils are equal, round, and reactive to light.  Neck:     Thyroid: No thyromegaly.     Vascular: No JVD.  Cardiovascular:     Rate and Rhythm: Normal rate and regular rhythm.     Heart sounds: Normal heart sounds. No murmur heard.   No friction rub. No gallop.  Pulmonary:     Effort: Pulmonary effort is normal. No respiratory distress.     Breath sounds: Normal breath sounds. No wheezing or rales.  Abdominal:     General: Bowel sounds are normal. There is no distension.     Palpations: Abdomen is soft. There is no mass.     Tenderness: There is no abdominal tenderness.  Musculoskeletal:        General: No tenderness.  Normal range of motion.     Cervical back: Normal range of motion and neck supple.  Lymphadenopathy:     Cervical: No cervical adenopathy.  Skin:    General: Skin is warm and dry.     Findings: No erythema or rash.  Neurological:     Mental Status: He is alert.     Coordination: Coordination normal.     Comments: The patient is alert, he is awake, he is able to follow commands with  3 of the 4 extremities, the right upper extremity has some significant hemiplegia.  There is no obvious facial droop, there is expressive aphasia.  He has normal strength in the supine position in both of his legs when she can straight leg raise for 10 seconds without any drift  Psychiatric:        Behavior: Behavior normal.    ED Results / Procedures / Treatments   Labs (all labs ordered are listed, but only abnormal results are displayed) Labs Reviewed  CBC - Abnormal; Notable for the following components:      Result Value   WBC 12.8 (*)    RBC 5.94 (*)    Hemoglobin 17.8 (*)    HCT 53.3 (*)    All other components within normal limits  COMPREHENSIVE METABOLIC PANEL - Abnormal; Notable for the following components:   Potassium 3.4 (*)    Chloride 97 (*)    Glucose, Bld 105 (*)    All other components within normal limits  CBG MONITORING, ED - Abnormal; Notable for the following components:   Glucose-Capillary 106 (*)    All other components within normal limits    EKG EKG Interpretation  Date/Time:  Monday September 08 2021 16:17:48 EST Ventricular Rate:  61 PR Interval:  209 QRS Duration: 109 QT Interval:  437 QTC Calculation: 441 R Axis:   -52 Text Interpretation: Sinus rhythm Borderline prolonged PR interval LAD, consider left anterior fascicular block Low voltage, extremity and precordial leads Consider anterior infarct Confirmed by Eber Hong ([id number]) on 09/08/2021 4:33:52 PM  Radiology CT Head Wo Contrast  Result Date: 09/08/2021 CLINICAL DATA:  Seizure, new-onset, no history of  trauma EXAM: CT HEAD WITHOUT CONTRAST TECHNIQUE: Contiguous axial images were obtained from the base of the skull through the vertex without intravenous contrast. RADIATION DOSE REDUCTION: This exam was performed according to the departmental dose-optimization program which includes automated exposure control, adjustment of the mA and/or kV according to patient size and/or use of iterative reconstruction technique. COMPARISON:  August 11, 2020. FINDINGS: Brain: Continued evolution of prior left MCA territory infarct with encephalomalacia.No definite new/acute infarct by CT. No acute hemorrhage or abnormal mass effect. 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 No mass lesion, midline shift, or hydrocephalus. Partially empty sella. Vascular: No hyperdense vessel identified. Skull: No acute fracture. Sinuses/Orbits: Ethmoid air cell mucosal thickening. Unremarkable orbits. Other: No mastoid effusions. IMPRESSION: 1. No evidence of acute intracranial abnormality. 2. Evolution of prior left MCA territory infarct with encephalomalacia. Electronically Signed   By: Feliberto Harts M.D.   On: 09/08/2021 16:58    Procedures Procedures    Medications Ordered in ED Medications  levETIRAcetam (KEPPRA) IVPB 1000 mg/100 mL premix (0 mg Intravenous Stopped 09/08/21 1837)  sodium chloride 0.9 % bolus 1,000 mL (1,000 mLs Intravenous New Bag/Given 09/08/21 1926)    ED Course/ Medical Decision Making/ A&P                           Medical Decision Making Amount and/or Complexity of Data Reviewed Labs: ordered. Radiology: ordered.  Risk Prescription drug management.   MDM: At this time the patient will be given Keppra IV, we will check a CT scan of the brain as well as some labs.  He will need to be placed on a cardiac monitor and will obtain an EKG.  It is unclear whether this  was syncope or seizures, either way we do  not have any reported seizure-like activity specifics.  The mother will hopefully be here soon to give further information.  The mother has arrived, she reports that she was with him when this happened, it occurred while he was getting a needle stick for phlebotomy, he slumped forward, then his eyes rolled back, he did turn blue for a few seconds, he was not breathing very well, this then resolved and now he is back to his normal self.  She reports that prior seizure activity was not actually witnessed but he woke up in the morning with damage to his tongue and urinary incontinence.  He had neither of those things today  Imaging: I personally viewed and interpreted the CT scan which shows increasing encephalomalacia around the site of the prior stroke with no signs of hemorrhage masses tumors or any other significant abnormalities.  Labs: I have personally viewed and interpreted the labs which show that he has a slight leukocytosis, he does appear to be slightly dehydrated with an elevated hemoglobin of 17.8,  Interventions: Medication management: The patient was given Keppra 1000 mg by IV, some IV fluids as well.  ED course: I discussed the case with the consultant Dr. Thomasena Edis of the neurology service who recommends that we increase his Keppra dose and he can follow-up outpatient, no need for MRI at this time.  Unclear if this was actually seizure or syncope however will increase Keppra for home  I will discuss these recommendations with the family members They are in agreement.          Final Clinical Impression(s) / ED Diagnoses Final diagnoses:  Syncope, unspecified syncope type    Rx / DC Orders ED Discharge Orders          Ordered    levETIRAcetam (KEPPRA) 750 MG tablet  2 times daily        09/08/21 1956              Eber Hong, MD 09/08/21 2001

## 2021-09-08 NOTE — ED Notes (Signed)
Patient transported to CT 

## 2021-09-08 NOTE — ED Notes (Signed)
Discharge instructions reviewed with patient and family. Patient and family verbalized understanding of instructions. Follow-up care and medications were reviewed. Patient ambulatory with steady gait. VSS upon discharge.  °

## 2021-09-08 NOTE — Discharge Instructions (Signed)
Please increase your dose of Keppra to 750 mg twice a day.  You may pick up the next dose for the morning dose, see your doctor within 2 or 3 days for a recheck and please follow-up with your neurologist to let them know about the medication changes. ? ?ER for severe worsening symptom ?

## 2021-09-08 NOTE — ED Triage Notes (Signed)
PT BIB EMS after having a syncopal/seizure like episode at his Dr.'s office. Staff told EMS that he became unresponsive and cyanotic at the scene. Pt has a stroke hx with R sided deficits and since then has been experiencing seizures. Pt had no complaints with EMS. ? ?VSS with EMS.  ?

## 2021-09-09 ENCOUNTER — Ambulatory Visit: Payer: BC Managed Care – PPO | Admitting: Occupational Therapy

## 2021-09-09 ENCOUNTER — Ambulatory Visit: Payer: BC Managed Care – PPO | Admitting: Physical Therapy

## 2021-09-09 ENCOUNTER — Ambulatory Visit: Payer: BC Managed Care – PPO

## 2021-09-09 ENCOUNTER — Telehealth: Payer: Self-pay | Admitting: Adult Health

## 2021-09-09 NOTE — Telephone Encounter (Signed)
FYI

## 2021-09-09 NOTE — Telephone Encounter (Signed)
Called to confirm Tuesday 3/14 visit and mother advised patient was seen in the hospital yesterday. The hospital wanted to advise Shanda Bumps the seizure medication was changed to 750mg . Linda(mom) said if you needs to discuss please call her. Thank you ?

## 2021-09-09 NOTE — Telephone Encounter (Signed)
Thank you. This can be discussed at follow up visit as scheduled.  ?

## 2021-09-16 ENCOUNTER — Ambulatory Visit: Payer: BC Managed Care – PPO | Admitting: Physical Therapy

## 2021-09-16 ENCOUNTER — Ambulatory Visit: Payer: 59 | Admitting: Adult Health

## 2021-09-16 ENCOUNTER — Encounter: Payer: Self-pay | Admitting: Adult Health

## 2021-09-16 VITALS — BP 129/65 | HR 63 | Ht 73.0 in | Wt 318.0 lb

## 2021-09-16 DIAGNOSIS — I69351 Hemiplegia and hemiparesis following cerebral infarction affecting right dominant side: Secondary | ICD-10-CM

## 2021-09-16 DIAGNOSIS — I69320 Aphasia following cerebral infarction: Secondary | ICD-10-CM | POA: Diagnosis not present

## 2021-09-16 DIAGNOSIS — R569 Unspecified convulsions: Secondary | ICD-10-CM

## 2021-09-16 DIAGNOSIS — I69398 Other sequelae of cerebral infarction: Secondary | ICD-10-CM | POA: Diagnosis not present

## 2021-09-16 DIAGNOSIS — I63312 Cerebral infarction due to thrombosis of left middle cerebral artery: Secondary | ICD-10-CM | POA: Diagnosis not present

## 2021-09-16 MED ORDER — LEVETIRACETAM 750 MG PO TABS: 750.0000 mg | ORAL_TABLET | Freq: Two times a day (BID) | ORAL | 5 refills | Status: DC

## 2021-09-16 NOTE — Patient Instructions (Signed)
Continue keppra 750mg  twice daily for seizure prevention ? ?Continue Crestor for secondary stroke prevention ? ?Continue to follow up with PCP regarding cholesterol and blood pressure management  ?Maintain strict control of hypertension with blood pressure goal below for you ?0 seizures lasting for /90 and cholesterol with LDL cholesterol (bad cholesterol) goal below 70 mg/dL.  Call me let me know ? ?Signs of a Stroke? Follow the BEFAST method:  ?Balance Watch for a sudden loss of balance, trouble with coordination or vertigo ?Eyes Is there a sudden loss of vision in one or both eyes? Or double vision?  ?Face: Ask the person to smile. Does one side of the face droop or is it numb?  ?Arms: Ask the person to raise both arms. Does one arm drift downward? Is there weakness or numbness of a leg? ?Speech: Ask the person to repeat a simple phrase. Does the speech sound slurred/strange? Is the person confused ? ?Time: If you observe any of these signs, call 911. ? ? ? ? ? ?Followup in the future with me in 6 months or call earlier if needed ? ? ? ? ? ? ?Thank you for coming to see [place] at [facility]. I hope we have been able to provide you high quality care today. ? ?You may receive a patient satisfaction survey over the next few weeks. We would appreciate your feedback and comments so that we may continue to improve ourselves and the health of our patients. ? ?

## 2021-09-16 NOTE — Progress Notes (Signed)
?[facility] ?[id number] ?[place]. [place] [address] ?[phone] ? ?     OFFICE FOLLOW-UP NOTE ? ?Justin Mason ?Date of Birth:  1980-10-29 ?Medical Record Number:  [id number]  ? ?Primary neurologist: Dr. Pearlean Brownie ?Reason for visit: Stroke and seizure follow-up ? ? ?Chief Complaint  ?Patient presents with  ? Cerebrovascular Accident  ?  Rm 3, 4 month FU  mom- [name] "was at PCP, they were trying to draw blood from his hand and his eyes rolled back, he turned blue- went to ED"   ? Seizures  ?  ? ? ?HPI:  ? ?Update 09/16/2021 JM: Patient returns patient returns for stroke and seizure follow-up  accompanied by his mother.  Per epic review, he was seen in the ED on 09/08/2021 for syncope vs seizure episode after have a lab draw completed at his PCP office. Per ED note review, episode of loss of consciousness after blood draw without any specific seizure activity.  No confusion or mental status change upon coming to. He was found to be slightly dehydrated per lab work. CTH unremarkable.  ED increased keppra dose to 750mg  BID and d/c'd home. Does report possible seizure in Feb after waking up with incontinence and biting his tongue.  Reports compliance on Keppra at that time without missing any dosages, any changes in medications or any other provoking factor.  First onset of seizure type activity back in November where Keppra was started.  EEG completed which was unremarkable.  Requested completion of MRI but not completed due to financial reasons. Currently, he feels back to baseline functioning.  Tolerating increased dose of Keppra without side effects.  Therapies currently on hold due to recent events but plans on restarting soon.  Does report some improvement of right-sided weakness and aphasia.  Ambulates with rollator walker outdoors but does use cane indoors, denies any recent falls.  Denies new stroke/TIA symptoms.  Compliant on aspirin 81 mg daily and Crestor 10 mg daily without side effects.   Blood pressure today 129/65.  No further concerns at this time. ? ? ? ? ? ?History provided for reference purposes only  ?Update 05/18/2021 JM: Returns for 8-month stroke follow-up accompanied by his mother, 8-month who provides majority of history ? ?Initially doing well since prior visit with improvement of right-sided weakness but still having decreased mobility of right hand. Residual speech and language impairment but also improving. He returned back to driving short distance and always accompanied by family member and recently drove a tractor to mow the grass. He completed HH therapy and ambulating with a cane without difficulty. Per mother, PCP has been working on getting him set up with outpatient therapies but has been having difficulty getting set up (unable to view via epic).  Mother reports upon awakening Friday morning, his bedside tray table was knocked over but unsure of how this happened and since then, he doesn't feel like his "normal" self feeling more out of it and some worsening of gait now requiring use of Rollator walker.  Upon further discussion, he apparently was incontinent of urine upon awakening as well as blood in his mouth.  He does report feeling some confusion and increased fatigue upon awakening that morning. Mother then reports initially after hospital discharge, he would have frequent episodes of "staring off" where mother would check his BP and BG which were stable. He would be more fatigued and seeming "out of it" for 1-2 hours after. These occurred frequently the first 2 weeks  after discharge but then subsided. Denies any prior hx of seizures and denies any additional witnessed seizures since Friday. Mother reports paternal grandmother had seizures after a stroke but no seizure prior and no other family history of seizures. ? ?Compliant on aspirin and Crestor -denies side effects ?Blood pressure today 106/65 ? ?No further concerns at this time ? ? ?Initial visit 11/18/2020 Dr.  Pearlean BrownieSethi: Justin Mason is a 41 year old Caucasian male seen today for initial office follow-up visit following hospital admission for stroke in January 2022.  Is accompanied by his girlfriend and mother today.  History is obtained from them and review of electronic medical records and I personally reviewed pertinent available imaging films in PACS.  He has past medical history of morbid obesity, hypertension, hyperlipidemia, arthritis and diabetes.  He presented on 07/24/2020 with sudden onset of difficulty speaking that occurred at 10:30 in the morning on day of admission.  He was brought to the hospital and was noted to be aphasic and a code stroke was activated.  He underwent emergent CT angiogram which showed short segment occlusion of the proximal right M1 MCA with decreased caliber and opacification of the corresponding vascular tree.  Occlusion of the terminal left ICA and proximal left M1 segment with decreased caliber and contrast appropriate opacification.  Luminal irregularity of bilateral ACA and PCA vascular territories with moderate stenosis of the right P2/P3 segment.  CT perfusion suggestive of large area of hypoperfusion without core infarct in the left and hence was taken for emergent mechanical thrombectomy but unfortunately at the time of the angiogram it was discovered that the blockage was likely chronic in this thrombectomy could not be performed.  He was also found to be COVID-positive.  He was intubated and on mechanical ventilatory support for a few days in the ICU.  He was started on aspirin and Plavix for his MCA occlusion.  Chest x-ray showed patchy airspace disease right greater than left with suggestive multifocal infection from COVID.  Echocardiogram showed ejection fraction of 60 to 65% without definite cardiac source of embolism.  MRI scan showed acute left MCA infarct with occlusion of the left ICA terminus and left M1 segment with diminished opacification of distal left MCA branches.   LDL cholesterol was 36 mg percent and hemoglobin A1c was 6.2.  Patient was started on aspirin 81 mg and Plavix 75 mg daily for 3 months.  He was transferred to inpatient rehab where he stayed till 09/06/2020 and then was discharged home.  He is currently living at home.  He is getting home physical occupational and speech therapy.  His speech is improving and is able to speak many words and short sentences up to 9 words.  Complaints and is good.  Still has significant right hemiplegia is able to barely bend his right hand fingers and has no usable function in the right hand.  Is able to lift his right leg off the bed but is still not able to bear weight.  Even with physical therapist he can barely stand for a few moments.  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  Patient however can help with transfers.  He is  continent for his bladder and bowel function.  Is tolerating aspirin Plavix well without only minor bruising and no bleeding episodes.  His blood pressure is usually well controlled and today it is borderline in office at 148/91.  His had lipid profile checked by his primary care physician  last month but I do not have the results.  He is on Crestor 10 mg which is tolerating well. ? ? ? ? ?ROS:   ?14 system review of systems is positive for those listed in HPI and all other systems negative ? ?PMH:  ?Past Medical History:  ?Diagnosis Date  ? AKI (acute kidney injury) (HCC)   ? Arthritis   ? Cardiac murmur 05/24/2020  ? Cellulitis 01/03/2015  ? Cellulitis of right leg   ? Cellulitis of right lower extremity 01/03/2015  ? Essential hypertension   ? GERD (gastroesophageal reflux disease)   ? High cholesterol   ? Hyperlipemia   ? Hypertension   ? Morbid obesity (HCC) 05/24/2020  ? OSA (obstructive sleep apnea)   ? Rotator cuff tear, right 09/08/2011  ? Seizures (HCC)   ? Sepsis (HCC) 01/03/2015  ? Stroke [facility])   ? Type 2 diabetes mellitus with hyperglycemia (HCC)    ? ? ?Social History:  ?Social History  ? ?Socioeconomic History  ? Marital status: Significant Other  ?  Spouse name: Not on file  ? Number of children: Not on file  ? Years of education: Not on file  ? Highest ed

## 2021-09-25 NOTE — Progress Notes (Signed)
I agree with the above plan 

## 2021-12-22 ENCOUNTER — Other Ambulatory Visit: Payer: Self-pay | Admitting: Physician Assistant

## 2022-03-19 ENCOUNTER — Encounter: Payer: Self-pay | Admitting: Adult Health

## 2022-03-19 ENCOUNTER — Ambulatory Visit: Payer: BC Managed Care – PPO | Admitting: Adult Health

## 2022-03-19 VITALS — BP 127/72 | HR 78 | Ht 73.0 in | Wt 322.0 lb

## 2022-03-19 DIAGNOSIS — I69351 Hemiplegia and hemiparesis following cerebral infarction affecting right dominant side: Secondary | ICD-10-CM | POA: Diagnosis not present

## 2022-03-19 DIAGNOSIS — I69320 Aphasia following cerebral infarction: Secondary | ICD-10-CM | POA: Diagnosis not present

## 2022-03-19 DIAGNOSIS — I63312 Cerebral infarction due to thrombosis of left middle cerebral artery: Secondary | ICD-10-CM

## 2022-03-19 DIAGNOSIS — I69398 Other sequelae of cerebral infarction: Secondary | ICD-10-CM | POA: Diagnosis not present

## 2022-03-19 DIAGNOSIS — R569 Unspecified convulsions: Secondary | ICD-10-CM

## 2022-03-19 MED ORDER — LEVETIRACETAM 750 MG PO TABS: 750.0000 mg | ORAL_TABLET | Freq: Two times a day (BID) | ORAL | 3 refills | Status: DC

## 2022-03-19 NOTE — Progress Notes (Signed)
[facility] [address] [place]. [place] [id number] [phone]       OFFICE FOLLOW-UP NOTE  Justin Mason Date of Birth:  1980/10/30 Medical Record Number:  [id number]   Primary neurologist: Dr. Pearlean BrownieSethi Reason for visit: Stroke and seizure follow-up   Chief Complaint  Patient presents with   Follow-up    Pt is fine. His mother is asking for a brace for his right hand.Room 2 with mother.       HPI:   Update 03/19/2022 Justin Mason: Patient returns for 41-month stroke and seizure follow-up accompanied by his mother.  Overall stable from neurological standpoint.  Denies any new stroke/TIA symptoms or seizure activity.  Compliant on Keppra 750 mg twice daily.  Continued right-sided weakness and aphasia, stable since prior visit.  Use of rollator walker outdoors and cane indoors, denies any recent falls. Is able to ambulate short distance without AD. Has been doing exercises routinely at home. He does c/o right hand finger contractures, previously had a brace but doesn't think it helps with fingers. Compliant on aspirin and Crestor.  Blood pressure well controlled.  Closely follows with PCP Terrilyn SaverJoanna Inman, NP. Mother reports recent lab work at PCP office who plans on sending to us for further review.  No further concerns at this time.   History provided for reference purposes only Update 09/16/2021 Justin Mason: Patient returns patient returns for stroke and seizure follow-up  accompanied by his mother.  Per epic review, he was seen in the ED on 09/08/2021 for syncope vs seizure episode after have a lab draw completed at his PCP office. Per ED note review, episode of loss of consciousness after blood draw without any specific seizure activity.  No confusion or mental status change upon coming to. He was found to be slightly dehydrated per lab work. CTH unremarkable.  ED increased keppra dose to 750mg  BID and d/c'd home. Does report possible seizure in Feb after waking up with incontinence and  biting his tongue.  Reports compliance on Keppra at that time without missing any dosages, any changes in medications or any other provoking factor.  First onset of seizure type activity back in November where Keppra was started.  EEG completed which was unremarkable.  Requested completion of MRI but not completed due to financial reasons. Currently, he feels back to baseline functioning.  Tolerating increased dose of Keppra without side effects.  Therapies currently on hold due to recent events but plans on restarting soon.  Does report some improvement of right-sided weakness and aphasia.  Ambulates with rollator walker outdoors but does use cane indoors, denies any recent falls.  Denies new stroke/TIA symptoms.  Compliant on aspirin 81 mg daily and Crestor 10 mg daily without side effects.  Blood pressure today 129/65.  No further concerns at this time.   Update 05/18/2021 Justin Mason: Returns for 41-month stroke follow-up accompanied by his mother, Bonita QuinLinda who provides majority of history  Initially doing well since prior visit with improvement of right-sided weakness but still having decreased mobility of right hand. Residual speech and language impairment but also improving. He returned back to driving short distance and always accompanied by family member and recently drove a tractor to mow the grass. He completed HH therapy and ambulating with a cane without difficulty. Per mother, PCP has been working on getting him set up with outpatient therapies but has been having difficulty getting set up (unable to view via epic).  Mother reports upon awakening Friday morning, his bedside tray  table was knocked over but unsure of how this happened and since then, he doesn't feel like his "normal" self feeling more out of it and some worsening of gait now requiring use of Rollator walker.  Upon further discussion, he apparently was incontinent of urine upon awakening as well as blood in his mouth.  He does report feeling some  confusion and increased fatigue upon awakening that morning. Mother then reports initially after hospital discharge, he would have frequent episodes of "staring off" where mother would check his BP and BG which were stable. He would be more fatigued and seeming "out of it" for 1-2 hours after. These occurred frequently the first 2 weeks after discharge but then subsided. Denies any prior hx of seizures and denies any additional witnessed seizures since Friday. Mother reports paternal grandmother had seizures after a stroke but no seizure prior and no other family history of seizures.  Compliant on aspirin and Crestor -denies side effects Blood pressure today 106/65  No further concerns at this time   Initial visit 11/18/2020 Dr. Pearlean Brownie: [name] is a 40 year old Caucasian male seen today for initial office follow-up visit following hospital admission for stroke in January 2022.  Is accompanied by his girlfriend and mother today.  History is obtained from them and review of electronic medical records and I personally reviewed pertinent available imaging films in PACS.  He has past medical history of morbid obesity, hypertension, hyperlipidemia, arthritis and diabetes.  He presented on 07/24/2020 with sudden onset of difficulty speaking that occurred at 10:30 in the morning on day of admission.  He was brought to the hospital and was noted to be aphasic and a code stroke was activated.  He underwent emergent CT angiogram which showed short segment occlusion of the proximal right M1 MCA with decreased caliber and opacification of the corresponding vascular tree.  Occlusion of the terminal left ICA and proximal left M1 segment with decreased caliber and contrast appropriate opacification.  Luminal irregularity of bilateral ACA and PCA vascular territories with moderate stenosis of the right P2/P3 segment.  CT perfusion suggestive of large area of hypoperfusion without core infarct in the left and hence was taken  for emergent mechanical thrombectomy but unfortunately at the time of the angiogram it was discovered that the blockage was likely chronic in this thrombectomy could not be performed.  He was also found to be COVID-positive.  He was intubated and on mechanical ventilatory support for a few days in the ICU.  He was started on aspirin and Plavix for his MCA occlusion.  Chest x-ray showed patchy airspace disease right greater than left with suggestive multifocal infection from COVID.  Echocardiogram showed ejection fraction of 60 to 65% without definite cardiac source of embolism.  MRI scan showed acute left MCA infarct with occlusion of the left ICA terminus and left M1 segment with diminished opacification of distal left MCA branches.  LDL cholesterol was 36 mg percent and hemoglobin A1c was 6.2.  Patient was started on aspirin 81 mg and Plavix 75 mg daily for 3 months.  He was transferred to inpatient rehab where he stayed till 09/06/2020 and then was discharged home.  He is currently living at home.  He is getting home physical occupational and speech therapy.  His speech is improving and is able to speak many words and short sentences up to 9 words.  Complaints and is good.  Still has significant right hemiplegia is able to barely bend his right hand fingers and  has no usable function in the right hand.  Is able to lift his right leg off the bed but is still not able to bear weight.  Even with physical therapist he can barely stand for a few moments.  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  Patient however can help with transfers.  He is  continent for his bladder and bowel function.  Is tolerating aspirin Plavix well without only minor bruising and no bleeding episodes.  His blood pressure is usually well controlled and today it is borderline in office at 148/91.  His had lipid profile checked by his primary care physician last month but I do not have the results.  He is  on Crestor 10 mg which is tolerating well.     ROS:   14 system review of systems is positive for those listed in HPI and all other systems negative  PMH:  Past Medical History:  Diagnosis Date   AKI (acute kidney injury) (HCC)    Arthritis    Cardiac murmur 05/24/2020   Cellulitis 01/03/2015   Cellulitis of right leg    Cellulitis of right lower extremity 01/03/2015   Essential hypertension    GERD (gastroesophageal reflux disease)    High cholesterol    Hyperlipemia    Hypertension    Morbid obesity (HCC) 05/24/2020   OSA (obstructive sleep apnea)    Rotator cuff tear, right 09/08/2011   Seizures (HCC)    Sepsis (HCC) 01/03/2015   Stroke (HCC)    Type 2 diabetes mellitus with hyperglycemia (HCC)     Social History:  Social History   Socioeconomic History   Marital status: Significant Other    Spouse name: Not on file   Number of children: Not on file   Years of education: Not on file   Highest education level: Not on file  Occupational History   Not on file  Tobacco Use   Smoking status: Never   Smokeless tobacco: Former    Types: Snuff  Vaping Use   Vaping Use: Never used  Substance and Sexual Activity   Alcohol use: Yes    Alcohol/week: 7.0 standard drinks of alcohol    Types: 7 Shots of liquor per week    Comment: 3/14/23every now and then, 3 beers/wk"   Drug use: No   Sexual activity: Yes  Other Topics Concern   Not on file  Social History Narrative   Lives with mom and girlfriend   Right Handed   Drinks caffeinated tea   Social Determinants of Health   Financial Resource Strain: Not on file  Food Insecurity: Not on file  Transportation Needs: Not on file  Physical Activity: Not on file  Stress: Not on file  Social Connections: Not on file  Intimate Partner Violence: Not on file    Medications:   Current Outpatient Medications on File Prior to Visit  Medication Sig Dispense Refill   acetaminophen (TYLENOL) 325 MG tablet Take 1-2  tablets (325-650 mg total) by mouth every 4 (four) hours as needed for mild pain.     allopurinol (ZYLOPRIM) 100 MG tablet Take 100 mg by mouth as needed (gout).     Ascorbic Acid (VITAMIN C PO) Take 1 tablet by mouth every evening.     aspirin EC 81 MG tablet Take 81 mg by mouth daily. Swallow whole.     baclofen (LIORESAL) 10 MG tablet TAKE 1 TABLET BY MOUTH THREE TIMES DAILY  90 each 2   chlorthalidone (HYGROTON) 25 MG tablet TAKE 1 TABLET BY MOUTH DAILY 30 tablet 6   Cholecalciferol (VITAMIN D-3) 25 MCG (1000 UT) CAPS Take 1,000 Units by mouth every evening.     dapagliflozin propanediol (FARXIGA) 10 MG TABS tablet Take 1 tablet (10 mg total) by mouth daily. 30 tablet 0   diclofenac Sodium (VOLTAREN) 1 % GEL Apply 2 g topically 4 (four) times daily. 350 g 0   gabapentin (NEURONTIN) 300 MG capsule TAKE 1 CAPSULE BY MOUTH THREE TIMES DAILY (Patient taking differently: 2 (two) times daily.) 90 capsule 1   hydrALAZINE (APRESOLINE) 50 MG tablet Take 1 tablet (50 mg total) by mouth 2 (two) times daily. 180 tablet 3   irbesartan (AVAPRO) 300 MG tablet TAKE 1 TABLET BY MOUTH DAILY 90 tablet 3   levETIRAcetam (KEPPRA) 750 MG tablet Take 1 tablet (750 mg total) by mouth 2 (two) times daily. 60 tablet 5   metFORMIN (GLUCOPHAGE-XR) 500 MG 24 hr tablet Take 500 mg by mouth 2 (two) times daily.     metoprolol tartrate (LOPRESSOR) 100 MG tablet Take 1 tablet (100 mg total) by mouth 2 (two) times daily. 180 tablet 3   Multiple Vitamin (MULTIVITAMIN WITH MINERALS) TABS tablet Take 1 tablet by mouth daily. 30 tablet 0   rosuvastatin (CRESTOR) 10 MG tablet Take 1 tablet (10 mg total) by mouth daily. 30 tablet 0   zinc gluconate 50 MG tablet Take 50 mg by mouth daily.     colchicine 0.6 MG tablet Take 1 tablet (0.6 mg total) by mouth daily. (Patient not taking: Reported on 03/19/2022) 7 tablet 0   pantoprazole (PROTONIX) 40 MG tablet TAKE 1 TABLET (40 MG TOTAL) BY MOUTH DAILY. 30 tablet 0   No current  facility-administered medications on file prior to visit.    Allergies:  No Known Allergies  Physical Exam Today's Vitals   03/19/22 1413  BP: 127/72  Pulse: 78  Weight: (!) 322 lb (146.1 kg)  Height: 6\' 1"  (1.854 m)   Body mass index is 42.48 kg/m.    General: Obese very pleasant middle-age Caucasian male seated, in no evident distress Head: head normocephalic and atraumatic.  Neck: supple with no carotid or supraclavicular bruits Cardiovascular: regular rate and rhythm, no murmurs Musculoskeletal: no deformity Skin:  no rash/petichiae Vascular:  Normal pulses all extremities  Neurologic Exam Mental Status: Awake and fully alert. Oriented to place and time. Recent and remote memory intact. Attention span, concentration and fund of knowledge appropriate during visit.  Mild to moderate expressive aphasia with paraphasic errors but able to name objects without difficulty and follow simple step commands.  Mild dysarthria. Mood and affect appropriate.  Cranial Nerves: Pupils equal, briskly reactive to light. Extraocular movements full without nystagmus. Visual fields full to confrontation. Hearing intact. Facial sensation intact.  Face, tongue, palate moves normally and symmetrically.  Motor: Normal bulk and tone. Normal strength on the left side.   RUE: 4/5 with increased tone in right hand with contracture of thumb and pinky, middle finger PIP hyperextension, fixed extension of pointer finger RLE: 4+-5/5 throughout Sensory.: intact to touch ,pinprick .position and vibratory sensation.  Coordination: Rapid alternating movements impaired right hand.  Finger-to-nose mild incoordination RUE and heel-to-shin performed accurately bilaterally gait and Station: Stands from seated position without difficulty.  Stance is normal.  Gait demonstrates normal stride length with mild unsteadiness and use of rollator walker.  Able to take some steps without use of rollator walker  with hemiplegic gait  and mild unsteadiness.  Tandem walking heel toe not attempted Reflexes: 1+ and asymmetric and brisker on the right. Toes downgoing.         ASSESSMENT: 41 year old Caucasian male with left middle cerebral artery infarction and January 2022 due to left middle cerebral artery occlusion s/p attempted unsuccessful mechanical thrombectomy with also diffuse intracranial multi vessel atherosclerosis with significant residual aphasia and right hemiplegia.  Vascular risk factors of obesity, diabetes, hypertension and hyperlipidemia.      PLAN:  1. 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) -Continue routine exercises at home and use of rollator walker at all times unless otherwise instructed -discussed potential benefit with use of botox for hand contracture, he would like to further consider and wil call office if interested in pursuing -he declines interest in re-evaluation by OT to obtain brace for hand - he was advised to call office if he wishes to pursue in the future -Continue aspirin 81 mg daily and Crestor 10 mg daily for secondary stroke prevention -Close PCP follow-up to maintain strict control of hypertension with blood pressure goal below 130/90, diabetes with hemoglobin A1c goal below 7% and lipids with LDL cholesterol goal below 70 mg/dL.   2. Seizure as late effect of cerebrovascular accident (CVA) (HCC) -Continue Keppra 750 mg twice daily -refill provided -Prior seizure activity 05/2021 and 08/2021 -Advised to call with any additional seizures or seizure-like activity     Follow-up in 6 months or call earlier if needed    CC:  Eber Jones, NP   I spent 32 minutes of face-to-face and non-face-to-face time with patient and mother.  This included previsit chart review, lab review, study review, order entry, electronic health record documentation, patient and mother education and discussion regarding above diagnoses and treatment plan and  answered all the questions to patient and mother satisfaction  Ihor Austin, [facility]  [facility] [address] [place], [place] [address]  Phone [phone] Fax [phone] Note: This document was prepared with digital dictation and possible smart phrase technology. Any transcriptional errors that result from this process are unintentional.

## 2022-03-19 NOTE — Patient Instructions (Addendum)
Continue keppra 750mg  twice daily for seizure prevention  Please let me know if you would like to pursue botox for hand tightness/spasticity   Continue aspirin 81 mg daily  and Crestor  for secondary stroke prevention  Continue to follow up with PCP regarding cholesterol, blood pressure and diabetes management  Maintain strict control of hypertension with blood pressure goal below 130/90, diabetes with hemoglobin A1c goal below 7.0 % and cholesterol with LDL cholesterol (bad cholesterol) goal below 70 mg/dL.   Signs of a Stroke? Follow the BEFAST method:  Balance Watch for a sudden loss of balance, trouble with coordination or vertigo Eyes Is there a sudden loss of vision in one or both eyes? Or double vision?  Face: Ask the person to smile. Does one side of the face droop or is it numb?  Arms: Ask the person to raise both arms. Does one arm drift downward? Is there weakness or numbness of a leg? Speech: Ask the person to repeat a simple phrase. Does the speech sound slurred/strange? Is the person confused ? Time: If you observe any of these signs, call 911.      Followup in the future with me in 6 months or call earlier if needed       Thank you for coming to see at [facility]. I hope we have been able to provide you high quality care today.  You may receive a patient satisfaction survey over the next few weeks. We would appreciate your feedback and comments so that we may continue to improve ourselves and the health of our patients.

## 2022-04-07 ENCOUNTER — Other Ambulatory Visit: Payer: Self-pay | Admitting: Cardiology

## 2022-04-14 ENCOUNTER — Other Ambulatory Visit: Payer: Self-pay | Admitting: Cardiology

## 2022-04-21 ENCOUNTER — Other Ambulatory Visit: Payer: Self-pay | Admitting: Cardiology

## 2022-07-16 ENCOUNTER — Other Ambulatory Visit: Payer: Self-pay | Admitting: Cardiology

## 2022-09-01 NOTE — Progress Notes (Unsigned)
Cardiology Office Note:    Date:  09/02/2022   ID:  Justin Mason, DOB 09-May-1981, MRN [id number]  PCP:  Evangeline Gula, NP (Inactive) [place] Cardiologist: Peter Martinique, MD   Reason for visit: 1 year follow-up  History of Present Illness:    Justin Mason is a 42 y.o. male with a hx of  HTN, HLD, DM2, CVA, OSA and morbid obesity.  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  He had seizure activity 05/2021 and 08/2021 as late effect of his CVA.  Echo 2022 with EF 60 to 65%, no significant valve disease.  He was last seen in the office in January 2023 with controlled BP.  Seen by neurology in 03/2022.  Patient's mom says he is on seizure medications but they have never been able to confirm seizures on neurologic testing.  Today, he and his mom state he is doing well.  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  He has recently been splitting wood and went hunting the other day.  His mom states patient's making consistent progress.  Now using the cane more than the walker.  Patient denies chest pain, shortness of breath, palpitations, lightheadedness, syncope, PND, orthopnea and lower extremity edema.  They have no specific concerns about medications today.    Past Medical History:  Diagnosis Date   AKI (acute kidney injury) ([place])    Arthritis    Cardiac murmur 05/24/2020   Cellulitis 01/03/2015   Cellulitis of right leg    Cellulitis of right lower extremity 01/03/2015   Essential hypertension    GERD (gastroesophageal reflux disease)    High cholesterol    Hyperlipemia    Hypertension    Morbid obesity ([place]) 05/24/2020   OSA (obstructive sleep apnea)    Rotator cuff tear, right 09/08/2011   Seizures ([place])    Sepsis ([place]) 01/03/2015   Stroke ([place])    Type 2 diabetes mellitus with hyperglycemia ([place])     Past Surgical History:  Procedure Laterality Date   INCISION AND DRAINAGE FOOT  1991-1992   STEPPED ON  NAIL / INFECTED WOUND   IR ANGIO INTRA EXTRACRAN SEL COM CAROTID INNOMINATE BILAT MOD SED  07/24/2020   IR ANGIO VERTEBRAL SEL SUBCLAVIAN INNOMINATE UNI L MOD SED  07/24/2020   IR ANGIO VERTEBRAL SEL VERTEBRAL UNI R MOD SED  07/24/2020   RADIOLOGY WITH ANESTHESIA N/A 07/24/2020   Procedure: IR WITH ANESTHESIA - CODE STROKE;  Surgeon: Radiologist, Medication, MD;  Location: [place];  Service: Radiology;  Laterality: N/A;   SHOULDER SURGERY     September 08, 2011    Current Medications: Current Meds  Medication Sig   acetaminophen (TYLENOL) 325 MG tablet Take 1-2 tablets (325-650 mg total) by mouth every 4 (four) hours as needed for mild pain.   allopurinol (ZYLOPRIM) 100 MG tablet Take 100 mg by mouth as needed (gout).   Ascorbic Acid (VITAMIN C PO) Take 1 tablet by mouth every evening.   aspirin EC 81 MG tablet Take 81 mg by mouth daily. Swallow whole.   baclofen (LIORESAL) 10 MG tablet TAKE 1 TABLET BY MOUTH THREE TIMES DAILY   chlorthalidone (HYGROTON) 25 MG tablet TAKE 1 TABLET BY MOUTH DAILY   Cholecalciferol (VITAMIN D-3) 25 MCG (1000 UT) CAPS Take 1,000 Units by mouth every evening.   colchicine 0.6 MG tablet Take 1 tablet (0.6 mg total) by mouth daily.   diclofenac Sodium (VOLTAREN)  1 % GEL Apply 2 g topically 4 (four) times daily.   gabapentin (NEURONTIN) 300 MG capsule TAKE 1 CAPSULE BY MOUTH THREE TIMES DAILY (Patient taking differently: 2 (two) times daily.)   hydrALAZINE (APRESOLINE) 50 MG tablet TAKE 1 TABLET BY MOUTH TWICE DAILY   irbesartan (AVAPRO) 300 MG tablet TAKE 1 TABLET BY MOUTH DAILY   levETIRAcetam (KEPPRA) 750 MG tablet Take 1 tablet (750 mg total) by mouth 2 (two) times daily.   metFORMIN (GLUCOPHAGE-XR) 500 MG 24 hr tablet Take 500 mg by mouth 2 (two) times daily.   metoprolol tartrate (LOPRESSOR) 50 MG tablet Take 1 tablet (50 mg total) by mouth 2 (two) times daily.   Multiple Vitamin (MULTIVITAMIN WITH MINERALS) TABS tablet Take 1 tablet by mouth daily.   rosuvastatin  (CRESTOR) 10 MG tablet Take 1 tablet (10 mg total) by mouth daily.   sertraline (ZOLOFT) 25 MG tablet Take 25 mg by mouth daily.   zinc gluconate 50 MG tablet Take 50 mg by mouth daily.   [DISCONTINUED] metoprolol tartrate (LOPRESSOR) 100 MG tablet TAKE 1 TABLET BY MOUTH TWICE DAILY     Allergies:   Patient has no known allergies.   Social History   Socioeconomic History   Marital status: Significant Other    Spouse name: Not on file   Number of children: Not on file   Years of education: Not on file   Highest education level: Not on file  Occupational History   Not on file  Tobacco Use   Smoking status: Never   Smokeless tobacco: Former    Types: Snuff  Vaping Use   Vaping Use: Never used  Substance and Sexual Activity   Alcohol use: Yes    Alcohol/week: 7.0 standard drinks of alcohol    Types: 7 Shots of liquor per week    Comment: 3/14/23every now and then, 3 beers/wk"   Drug use: No   Sexual activity: Yes  Other Topics Concern   Not on file  Social History Narrative   Lives with mom and girlfriend   Right Handed   Drinks caffeinated tea   Social Determinants of Health   Financial Resource Strain: Not on file  Food Insecurity: Not on file  Transportation Needs: Not on file  Physical Activity: Not on file  Stress: Not on file  Social Connections: Not on file     Family History: The patient's family history includes Anesthesia problems in his sister; Colon cancer in an other family member; Prostate cancer in an other family member.  ROS:   Please see the history of present illness.     EKGs/Labs/Other Studies Reviewed:    EKG:  The ekg ordered today demonstrates sinus rhythm with first-degree AV block, heart rate 61.  Recent Labs: 09/08/2021: ALT 29; BUN 12; Creatinine, Ser 1.13; Hemoglobin 17.8; Platelets 221; Potassium 3.4; Sodium 138   Recent Lipid Panel Lab Results  Component Value Date/Time   CHOL 93 07/25/2020 05:15 AM   TRIG 190 (H) 07/25/2020  05:15 AM   HDL 19 (L) 07/25/2020 05:15 AM   LDLCALC 36 07/25/2020 05:15 AM    Physical Exam:    VS:  BP 110/70   Pulse 61   Ht '6\' 2"'$  (1.88 m)   Wt (!) 326 lb 12.8 oz (148.2 kg)   BMI 41.96 kg/m    No data found.       Wt Readings from Last 3 Encounters:  09/02/22 (!) 326 lb 12.8 oz (148.2 kg)  03/19/22 Marland Kitchen)  322 lb (146.1 kg)  09/16/21 (!) 318 lb (144.2 kg)     GEN:  Well nourished, well developed in no acute distress; obese NECK: No JVD; No carotid bruits CARDIAC: RRR, no murmurs, rubs, gallops RESPIRATORY:  Clear to auscultation without rales, wheezing or rhonchi  MUSCULOSKELETAL: No edema SKIN: Warm and dry   ASSESSMENT AND PLAN   Hypertension, well controlled -With HR 61 & borderline low blood pressure, we will decrease his metoprolol tartrate from 100 mg twice daily to 50 mg twice daily.    Hyperlipidemia with goal LDL less than 70  -LDL 36 in January 2022. -Having lipids checked next month with PCP. -Continue Crestor. -Discussed cholesterol lowering diets - Mediterranean diet, DASH diet, vegetarian diet, low-carbohydrate diet and avoidance of trans fats.  Discussed healthier choice substitutes.  Nuts, high-fiber foods, and fiber supplements may also improve lipids.    Obesity -Given patient education sheet on healthy lifestyle and diet.  Disposition - Follow-up in 1 year with Dr. Martinique.  If he has consistent care with PCP, may be able to see [place] as needed.   Medication Adjustments/Labs and Tests Ordered: Current medicines are reviewed at length with the patient today.  Concerns regarding medicines are outlined above.  Orders Placed This Encounter  Procedures   EKG 12-Lead   Meds ordered this encounter  Medications   metoprolol tartrate (LOPRESSOR) 50 MG tablet    Sig: Take 1 tablet (50 mg total) by mouth 2 (two) times daily.    Dispense:  180 tablet    Refill:  3    Patient Instructions  Medication Instructions:  Decrease Metoprolol from 100 mg to 50  mg ( Take 1 Tablet Twice Daily). *If you need a refill on your cardiac medications before your next appointment, please call your pharmacy*   Lab Work: No Labs If you have labs (blood work) drawn today and your tests are completely normal, you will receive your results only by: [place] (if you have MyChart) OR A paper copy in the mail If you have any lab test that is abnormal or we need to change your treatment, we will call you to review the results.   Testing/Procedures: No Testing   Follow-Up: At [facility] you and your health needs are our priority.  As part of our continuing mission to provide you with exceptional heart care, we have created designated Provider Care Teams.  These Care Teams include your primary Cardiologist (physician) and Advanced Practice Providers (APPs -  Physician Assistants and Nurse Practitioners) who all work together to provide you with the care you need, when you need it.  We recommend signing up for the patient portal called "MyChart".  Sign up information is provided on this After Visit Summary.  MyChart is used to connect with patients for Virtual Visits (Telemedicine).  Patients are able to view lab/test results, encounter notes, upcoming appointments, etc.  Non-urgent messages can be sent to your provider as well.   To learn more about what you can do with MyChart, go to [email or web address].    Your next appointment:   1 year(s)  Provider:   Peter Martinique, MD     Signed, Warren Lacy, PA-C  09/02/2022 12:59 PM    [place]

## 2022-09-02 ENCOUNTER — Ambulatory Visit: Payer: BC Managed Care – PPO | Attending: Physician Assistant | Admitting: Physician Assistant

## 2022-09-02 ENCOUNTER — Encounter: Payer: Self-pay | Admitting: Physician Assistant

## 2022-09-02 VITALS — BP 110/70 | HR 61 | Ht 74.0 in | Wt 326.8 lb

## 2022-09-02 DIAGNOSIS — I10 Essential (primary) hypertension: Secondary | ICD-10-CM

## 2022-09-02 DIAGNOSIS — E785 Hyperlipidemia, unspecified: Secondary | ICD-10-CM | POA: Diagnosis not present

## 2022-09-02 MED ORDER — METOPROLOL TARTRATE 50 MG PO TABS: 50.0000 mg | ORAL_TABLET | Freq: Two times a day (BID) | ORAL | 3 refills | Status: DC

## 2022-09-02 NOTE — Patient Instructions (Signed)
Medication Instructions:  Decrease Metoprolol from 100 mg to 50 mg ( Take 1 Tablet Twice Daily). *If you need a refill on your cardiac medications before your next appointment, please call your pharmacy*   Lab Work: No Labs If you have labs (blood work) drawn today and your tests are completely normal, you will receive your results only by: [place] (if you have MyChart) OR A paper copy in the mail If you have any lab test that is abnormal or we need to change your treatment, we will call you to review the results.   Testing/Procedures: No Testing   Follow-Up: At [facility], you and your health needs are our priority.  As part of our continuing mission to provide you with exceptional heart care, we have created designated Provider Care Teams.  These Care Teams include your primary Cardiologist (physician) and Advanced Practice Providers (APPs -  Physician Assistants and Nurse Practitioners) who all work together to provide you with the care you need, when you need it.  We recommend signing up for the patient portal called "MyChart".  Sign up information is provided on this After Visit Summary.  MyChart is used to connect with patients for Virtual Visits (Telemedicine).  Patients are able to view lab/test results, encounter notes, upcoming appointments, etc.  Non-urgent messages can be sent to your provider as well.   To learn more about what you can do with MyChart, go to [email or web address].    Your next appointment:   1 year(s)  Provider:   Peter Martinique, MD

## 2022-09-17 ENCOUNTER — Ambulatory Visit: Payer: BC Managed Care – PPO | Admitting: Adult Health

## 2022-09-30 ENCOUNTER — Encounter: Payer: Self-pay | Admitting: Family Medicine

## 2022-09-30 ENCOUNTER — Other Ambulatory Visit: Payer: BC Managed Care – PPO

## 2022-09-30 ENCOUNTER — Ambulatory Visit: Payer: BC Managed Care – PPO | Admitting: Family Medicine

## 2022-09-30 VITALS — BP 132/74 | HR 98 | Temp 97.9°F | Ht 74.0 in | Wt 328.0 lb

## 2022-09-30 DIAGNOSIS — Z7689 Persons encountering health services in other specified circumstances: Secondary | ICD-10-CM

## 2022-09-30 DIAGNOSIS — K219 Gastro-esophageal reflux disease without esophagitis: Secondary | ICD-10-CM

## 2022-09-30 DIAGNOSIS — F32A Depression, unspecified: Secondary | ICD-10-CM

## 2022-09-30 DIAGNOSIS — E7800 Pure hypercholesterolemia, unspecified: Secondary | ICD-10-CM

## 2022-09-30 DIAGNOSIS — M109 Gout, unspecified: Secondary | ICD-10-CM

## 2022-09-30 DIAGNOSIS — E1165 Type 2 diabetes mellitus with hyperglycemia: Secondary | ICD-10-CM

## 2022-09-30 DIAGNOSIS — Z1159 Encounter for screening for other viral diseases: Secondary | ICD-10-CM

## 2022-09-30 DIAGNOSIS — I10 Essential (primary) hypertension: Secondary | ICD-10-CM

## 2022-09-30 MED ORDER — PANTOPRAZOLE SODIUM 40 MG PO TBEC: 40.0000 mg | DELAYED_RELEASE_TABLET | Freq: Every day | ORAL | 0 refills | Status: DC

## 2022-09-30 NOTE — Assessment & Plan Note (Addendum)
Controlled. Continue with Chlorthalidone 25mg  daily, Hydralazine 50mg  BID, Irbesartan 300mg  daily, and Metoprolol Tartrate 50mg  BID. Ordered CMP to assess kidney function and potassium level.

## 2022-09-30 NOTE — Assessment & Plan Note (Signed)
Continue with Rosuvastatin 10mg  daily. Ordered lipid panel to be drawn next week. Patient is not fasting today, but aware to be fasting when labs are drawn.

## 2022-09-30 NOTE — Assessment & Plan Note (Signed)
Controlled. Continue with Allopurinol daily and take Colchicine if needed for flare up.

## 2022-09-30 NOTE — Assessment & Plan Note (Signed)
Controlled. Refilled Pantoprazole 40mg  daily.

## 2022-09-30 NOTE — Progress Notes (Signed)
New Patient Office Visit  Subjective    Patient ID: Justin Mason, male    DOB: January 12, 1981  Age: 42 y.o. MRN: [id number]  CC:  Chief Complaint  Patient presents with   Establish Care    Pt is here today to [facility]. Pt is not  FASTING today.    HPI Justin Mason presents to establish care with new practice.   Patient previously seen this provider, Valarie Merino, NP, at [facility].   Specialist consist of cardiology, [facility] at [facility], Dr. Peter Martinique; and Frann Rider, NP with [facility]. Last seen cardiology on 09/02/2022 with a 1 year follow up. Last seen neurology on 03/19/2022 with a 6 month follow up. Patient declines to go back to neurology. He reports he spends a lot of money for a copay and does not feel like they do anything for him.   Gout: Chronic. Takes Allopurinol 100mg  daily and has Colchicine on hand if needed for a gout attack. Effective with no recent attack.   HTN: Chronic. Patient is taking Chlorthalidone 25mg  daily, Hydralazine 50mg  BID, Irbesartan 300mg  daily, and Metoprolol Tartrate 50mg  BID. Blood pressure is controlled with no complaints. At previous cardiology appointment in February, his Metoprolol dose was decreased due to heart rate being on the low normal side and borderline low blood pressure.   Diabetes: Chronic. Patient is taking Metformin 500mg  BID. Needs eye exam and foot exam. Denies any complications with medication or diabetes.   Depression: Chronic. Patient is taking Sertraline 25mg  daily. Effective.   Hyperlipidemia: Chronic. Patient is taking Rosuvastation 10mg  daily. No complications with taking medication. Patient is not fasting to have labs drawn today.   GERD: Chronic. Patient is taking Pantoprazole 40mg  daily. Effective. Needs refill.   Past Medical History:  Diagnosis Date   AKI (acute kidney injury) ([place])    Arthritis    Cardiac murmur 05/24/2020   Cellulitis 01/03/2015   Cellulitis of right leg     Cellulitis of right lower extremity 01/03/2015   Essential hypertension    GERD (gastroesophageal reflux disease)    High cholesterol    Hyperlipemia    Hypertension    Morbid obesity ([place]) 05/24/2020   OSA (obstructive sleep apnea)    Rotator cuff tear, right 09/08/2011   Seizures ([place])    Sepsis ([place]) 01/03/2015   Stroke ([place])    Type 2 diabetes mellitus with hyperglycemia ([place])    Past Surgical History:  Procedure Laterality Date   INCISION AND DRAINAGE FOOT  1991-1992   STEPPED ON NAIL / INFECTED WOUND   IR ANGIO INTRA EXTRACRAN SEL COM CAROTID INNOMINATE BILAT MOD SED  07/24/2020   IR ANGIO VERTEBRAL SEL SUBCLAVIAN INNOMINATE UNI L MOD SED  07/24/2020   IR ANGIO VERTEBRAL SEL VERTEBRAL UNI R MOD SED  07/24/2020   RADIOLOGY WITH ANESTHESIA N/A 07/24/2020   Procedure: IR WITH ANESTHESIA - CODE STROKE;  Surgeon: Radiologist, Medication, MD;  Location: [place];  Service: Radiology;  Laterality: N/A;   SHOULDER SURGERY     September 08, 2011    Family History  Problem Relation Age of Onset   Hyperlipidemia Mother    Vision loss Mother    Asthma Mother    Arthritis Mother    Heart disease Mother    COPD Mother    Heart attack Mother    Gout Father    Anesthesia problems Sister    Prostate cancer Other    Colon cancer Other    Social  History   Socioeconomic History   Marital status: Single    Spouse name: Not on file   Number of children: Not on file   Years of education: Not on file   Highest education level: Not on file  Occupational History   Not on file  Tobacco Use   Smoking status: Never   Smokeless tobacco: Former    Types: Snuff  Vaping Use   Vaping Use: Never used  Substance and Sexual Activity   Alcohol use: Not Currently    Comment: 1x week   Drug use: No   Sexual activity: Not Currently  Other Topics Concern   Not on file  Social History Narrative   Lives with mom and girlfriend   Right Handed   Drinks caffeinated tea   Social Determinants of  Health   Financial Resource Strain: Not on file  Food Insecurity: Not on file  Transportation Needs: Not on file  Physical Activity: Not on file  Stress: Not on file  Social Connections: Not on file  Intimate Partner Violence: Not on file    ROS See HPI above    Objective    BP 132/74   Pulse 98   Temp 97.9 F (36.6 C)   Ht 6\' 2"  (1.88 m)   Wt (!) 328 lb (148.8 kg)   SpO2 99%   BMI 42.11 kg/m   Physical Exam Constitutional:      General: He is not in acute distress.    Appearance: Normal appearance. He is obese. He is not ill-appearing, toxic-appearing or diaphoretic.  Eyes:     General:        Right eye: No discharge.        Left eye: No discharge.     Conjunctiva/sclera: Conjunctivae normal.  Cardiovascular:     Rate and Rhythm: Normal rate and regular rhythm.     Pulses:          Dorsalis pedis pulses are 2+ on the right side and 2+ on the left side.     Heart sounds: Normal heart sounds. No murmur heard.    No friction rub. No gallop.  Pulmonary:     Effort: Pulmonary effort is normal. No respiratory distress.     Breath sounds: Normal breath sounds.  Musculoskeletal:        General: Normal range of motion.     Right foot: Normal range of motion.     Left foot: Normal range of motion. No bunion (Start of a bunion).  Feet:     Right foot:     Protective Sensation: 10 sites tested.  10 sites sensed.     Skin integrity: Skin integrity normal.     Toenail Condition: Right toenails are long.     Left foot:     Protective Sensation: 10 sites tested.  10 sites sensed.     Skin integrity: Skin integrity normal.     Toenail Condition: Left toenails are long.  Skin:    General: Skin is warm and dry.  Neurological:     Mental Status: He is alert and oriented to person, place, and time. Mental status is at baseline.     Gait: Gait abnormal (Ambulates with cain).  Psychiatric:        Mood and Affect: Mood normal.        Behavior: Behavior normal.        Thought  Content: Thought content normal.        Judgment: Judgment normal.  Assessment & Plan:  Controlled type 2 diabetes mellitus with hyperglycemia, without long-term current use of insulin ([place]) Assessment & Plan: Controlled. Continue with Metformin 500mg  BID. Foot exam completed today with no concerns. Needs diabetic eye exam. Tonya, CMA is sending a message to have patient scheduled for diabetic eye exam at office when exam is offered. Ordered A1c and microalbumin/creatinine urine ratio for patient to come next week.   Orders: -     HM Diabetes Foot Exam -     Hemoglobin A1c; Future -     Microalbumin / creatinine urine ratio; Future  Essential hypertension Assessment & Plan: Controlled. Continue with Chlorthalidone 25mg  daily, Hydralazine 50mg  BID, Irbesartan 300mg  daily, and Metoprolol Tartrate 50mg  BID. Ordered CMP to assess kidney function and potassium level.   Orders: -     Comprehensive metabolic panel; Future  High cholesterol Assessment & Plan: Continue with Rosuvastatin 10mg  daily. Ordered lipid panel to be drawn next week. Patient is not fasting today, but aware to be fasting when labs are drawn.   Orders: -     Lipid panel; Future  Depression, unspecified depression type Assessment & Plan: Stable. Continue with Sertraline 25mg  daily. Denies SI or HI.    Controlled gout Assessment & Plan: Controlled. Continue with Allopurinol daily and take Colchicine if needed for flare up.    Gastroesophageal reflux disease without esophagitis Assessment & Plan: Controlled. Refilled Pantoprazole 40mg  daily.    Orders: -     Pantoprazole Sodium; TAKE 1 TABLET (40 MG TOTAL) BY MOUTH DAILY.  Dispense: 30 tablet; Refill: 0  Encounter for hepatitis C screening test for low risk patient -     Hepatitis C antibody; Future  Establishing care with new doctor, encounter for  Review health maintenance: declines covid booster; scheduled for diabetic eye exam in May at office;  ordered hep C screening for next week; thinks he had Tdap vaccine-will try to obtain records; declines influenza vaccine. Had long discussion about following up with neurologist and the importance of following up. Patients mother reports patient receives Medicare in July. Patient agreed to go back to neurology in July after obtaining Medicare. Also, offered to place another referral at [facility] if he is not comfortable with [facility]. Informed patient if he decided he wanted a referral, either call office or send a message through my chart.   Return in about 3 months (around 12/31/2022).   Valarie Merino, NP

## 2022-09-30 NOTE — Assessment & Plan Note (Signed)
Stable. Continue with Sertraline 25mg  daily. Denies SI or HI.

## 2022-09-30 NOTE — Assessment & Plan Note (Signed)
Controlled. Continue with Metformin 500mg  BID. Foot exam completed today with no concerns. Needs diabetic eye exam. Tonya, CMA is sending a message to have patient scheduled for diabetic eye exam at office when exam is offered. Ordered A1c and microalbumin/creatinine urine ratio for patient to come next week.

## 2022-09-30 NOTE — Patient Instructions (Addendum)
-  Labs have been ordered but will need an appointment next week when fasting.  -Continue with all medications. Refilled Protonix.  -Ordered micro/albumin creat ratio. Will need to obtain when he gets labs drawn.  -Follow up in 3 months.  -If you change your mind about referral to a different neurologist, just send me a message through MyChart or call the office. Will plan to follow up with neurologist in July when you get Medicare.

## 2022-10-06 ENCOUNTER — Telehealth: Payer: Self-pay

## 2022-10-06 ENCOUNTER — Other Ambulatory Visit: Payer: Self-pay

## 2022-10-06 ENCOUNTER — Other Ambulatory Visit (INDEPENDENT_AMBULATORY_CARE_PROVIDER_SITE_OTHER): Payer: BC Managed Care – PPO

## 2022-10-06 DIAGNOSIS — I10 Essential (primary) hypertension: Secondary | ICD-10-CM | POA: Diagnosis not present

## 2022-10-06 DIAGNOSIS — Z1159 Encounter for screening for other viral diseases: Secondary | ICD-10-CM

## 2022-10-06 DIAGNOSIS — E7800 Pure hypercholesterolemia, unspecified: Secondary | ICD-10-CM

## 2022-10-06 DIAGNOSIS — E1165 Type 2 diabetes mellitus with hyperglycemia: Secondary | ICD-10-CM | POA: Diagnosis not present

## 2022-10-06 DIAGNOSIS — E876 Hypokalemia: Secondary | ICD-10-CM

## 2022-10-06 LAB — LIPID PANEL
Cholesterol: 103 mg/dL (ref 0–200)
HDL: 39.2 mg/dL (ref >39.00)
LDL Cholesterol: 31 mg/dL (ref 0–99)
NonHDL: 63.96
Total CHOL/HDL Ratio: 3
Triglycerides: 166 mg/dL — ABNORMAL HIGH (ref 0.0–149.0)
VLDL: 33.2 mg/dL (ref 0.0–40.0)

## 2022-10-06 LAB — COMPREHENSIVE METABOLIC PANEL
ALT: 30 U/L (ref 0–53)
AST: 31 U/L (ref 0–37)
Albumin: 4.5 g/dL (ref 3.5–5.2)
Alkaline Phosphatase: 60 U/L (ref 39–117)
BUN: 11 mg/dL (ref 6–23)
CO2: 31 mEq/L (ref 19–32)
Calcium: 9.8 mg/dL (ref 8.4–10.5)
Chloride: 95 mEq/L — ABNORMAL LOW (ref 96–112)
Creatinine, Ser: 0.91 mg/dL (ref 0.40–1.50)
GFR: 104.68 mL/min (ref >60.00)
Glucose, Bld: 111 mg/dL — ABNORMAL HIGH (ref 70–99)
Potassium: 3 mEq/L — ABNORMAL LOW (ref 3.5–5.1)
Sodium: 134 mEq/L — ABNORMAL LOW (ref 135–145)
Total Bilirubin: 0.7 mg/dL (ref 0.2–1.2)
Total Protein: 7.8 g/dL (ref 6.0–8.3)

## 2022-10-06 LAB — MICROALBUMIN / CREATININE URINE RATIO
Creatinine,U: 106.6 mg/dL
Microalb Creat Ratio: 0.7 mg/g (ref 0.0–30.0)
Microalb, Ur: <0.7 mg/dL (ref 0.0–1.9)

## 2022-10-06 LAB — HEMOGLOBIN A1C: Hgb A1c MFr Bld: 5.4 % (ref 4.6–6.5)

## 2022-10-06 MED ORDER — POTASSIUM CHLORIDE CRYS ER 20 MEQ PO TBCR: 20.0000 meq | EXTENDED_RELEASE_TABLET | Freq: Every day | ORAL | 0 refills | Status: DC

## 2022-10-06 NOTE — Telephone Encounter (Signed)
-----   Message from Evangeline Gula, NP sent at 10/06/2022  3:35 PM EDT -----  Triglycerides are elevated, recommend to decrease carbohydrates and sweets. Otherwise, cholesterol is stable.  Potassium is low-recommend Potassium Chloride 52mEq, 1 tablet once a day for 5 days. Needs a recheck, BMP on Monday, April 8th.  Sodium is barely low, will reassess at next lab draw on Monday.  A1c is good enough for [place] to decrease down to 500mg  once a day instead of twice a day.  All other labs are stable.

## 2022-10-06 NOTE — Progress Notes (Signed)
Pt has been notified. Rx sent in . Lab visit is scheduled.

## 2022-10-07 ENCOUNTER — Telehealth: Payer: Self-pay

## 2022-10-07 LAB — HEPATITIS C ANTIBODY: Hepatitis C Ab: NONREACTIVE

## 2022-10-07 NOTE — Telephone Encounter (Signed)
-----   Message from Evangeline Gula, NP sent at 10/07/2022  2:21 PM EDT ----- Hep C is non reactive.

## 2022-10-12 ENCOUNTER — Other Ambulatory Visit (INDEPENDENT_AMBULATORY_CARE_PROVIDER_SITE_OTHER): Payer: BC Managed Care – PPO

## 2022-10-12 DIAGNOSIS — E876 Hypokalemia: Secondary | ICD-10-CM

## 2022-10-12 LAB — BASIC METABOLIC PANEL
BUN: 11 mg/dL (ref 6–23)
CO2: 24 mEq/L (ref 19–32)
Calcium: 9.3 mg/dL (ref 8.4–10.5)
Chloride: 97 mEq/L (ref 96–112)
Creatinine, Ser: 0.86 mg/dL (ref 0.40–1.50)
GFR: 107.53 mL/min (ref >60.00)
Glucose, Bld: 116 mg/dL — ABNORMAL HIGH (ref 70–99)
Potassium: 3.8 mEq/L (ref 3.5–5.1)
Sodium: 134 mEq/L — ABNORMAL LOW (ref 135–145)

## 2022-10-29 ENCOUNTER — Telehealth: Payer: Self-pay | Admitting: Family Medicine

## 2022-10-29 DIAGNOSIS — Z0279 Encounter for issue of other medical certificate: Secondary | ICD-10-CM

## 2022-10-29 NOTE — Telephone Encounter (Signed)
Patient mother dropped off forms from [employer]. Placed in front bin with charge sheet.

## 2022-10-30 NOTE — Telephone Encounter (Signed)
Placed in folder

## 2022-11-02 ENCOUNTER — Telehealth: Payer: Self-pay

## 2022-11-02 NOTE — Telephone Encounter (Signed)
Duplicate

## 2022-11-05 ENCOUNTER — Telehealth: Payer: Self-pay

## 2022-11-05 ENCOUNTER — Other Ambulatory Visit (HOSPITAL_COMMUNITY): Payer: Self-pay

## 2022-11-05 NOTE — Telephone Encounter (Signed)
Patient Advocate Encounter   Received notification from [facility] that prior authorization for Pantoprazole is required.   PA submitted on 11/05/2022 Key [id number] Status is pending     Waiting on clinical questions to populate

## 2022-11-09 ENCOUNTER — Other Ambulatory Visit: Payer: Self-pay | Admitting: Cardiology

## 2022-11-09 ENCOUNTER — Other Ambulatory Visit: Payer: Self-pay

## 2022-11-09 ENCOUNTER — Other Ambulatory Visit (HOSPITAL_COMMUNITY): Payer: Self-pay

## 2022-11-09 DIAGNOSIS — K219 Gastro-esophageal reflux disease without esophagitis: Secondary | ICD-10-CM

## 2022-11-09 MED ORDER — PANTOPRAZOLE SODIUM 40 MG PO TBEC: 40.0000 mg | DELAYED_RELEASE_TABLET | Freq: Every day | ORAL | 0 refills | Status: DC

## 2022-11-09 NOTE — Telephone Encounter (Signed)
Pharmacy Patient Advocate Encounter  Prior Authorization for Pantoprazole Sodium 40MG  dr tablets has been approved by [employer] (ins).     Effective dates: 11/05/2022 through 11/05/2022  Copay is $2.26  Melanee Spry CPhT Rx Patient Advocate [phone]

## 2022-11-09 NOTE — Telephone Encounter (Signed)
Pt has been aware of this medication was approved. Sent in 90 day supply per Waymon Budge

## 2022-11-18 LAB — HM DIABETES EYE EXAM

## 2022-11-23 ENCOUNTER — Encounter: Payer: Self-pay | Admitting: *Deleted

## 2022-12-10 ENCOUNTER — Other Ambulatory Visit: Payer: Self-pay | Admitting: Physician Assistant

## 2022-12-31 NOTE — Progress Notes (Deleted)
   Established Patient Office Visit   Subjective:  Patient ID: Justin Mason, male    DOB: 1980-08-06  Age: 42 y.o. MRN: [id number]  No chief complaint on file.   HPI Diabetes: Chronic. On previous labs, his A1c was 5.4. Recommended to decrease Metformin to 500mg  once a day instead of twice a day.   HTN: Chronic. Patient is taking Chlorthalidone 25mg  daily, Hydralazine 50mg  BID, Irbesartan 300mg  daily, and Metoprolol Tartrate 50mg  BID. Blood pressure is controlled with no complaints. On previous labs, patients potassium was low and had to be supplemented that brought his potassium back up to normal.    ROS See HPI above     Objective:     There were no vitals taken for this visit. {Vitals History (Optional):23777}  Physical Exam  No results found for any visits on 01/04/23.  The ASCVD Risk score (Arnett DK, et al., 2019) failed to calculate for the following reasons:   The patient has a prior MI or stroke diagnosis    Assessment & Plan:  Essential hypertension  Controlled type 2 diabetes mellitus with hyperglycemia, without long-term current use of insulin (HCC)  1.Review health maintenance: -Declines covid vaccine.   36month follow up No follow-ups on file.   Zandra Abts, NP

## 2023-01-04 ENCOUNTER — Ambulatory Visit: Payer: BC Managed Care – PPO | Admitting: Family Medicine

## 2023-01-04 DIAGNOSIS — I10 Essential (primary) hypertension: Secondary | ICD-10-CM

## 2023-01-04 DIAGNOSIS — E1165 Type 2 diabetes mellitus with hyperglycemia: Secondary | ICD-10-CM

## 2023-02-02 ENCOUNTER — Encounter: Payer: Self-pay | Admitting: Family Medicine

## 2023-02-02 ENCOUNTER — Ambulatory Visit (INDEPENDENT_AMBULATORY_CARE_PROVIDER_SITE_OTHER): Payer: Medicare (Managed Care) | Admitting: Family Medicine

## 2023-02-02 VITALS — BP 124/78 | HR 72 | Temp 98.2°F | Ht 74.0 in | Wt 332.5 lb

## 2023-02-02 DIAGNOSIS — Z7984 Long term (current) use of oral hypoglycemic drugs: Secondary | ICD-10-CM | POA: Diagnosis not present

## 2023-02-02 DIAGNOSIS — M109 Gout, unspecified: Secondary | ICD-10-CM | POA: Diagnosis not present

## 2023-02-02 DIAGNOSIS — L720 Epidermal cyst: Secondary | ICD-10-CM | POA: Diagnosis not present

## 2023-02-02 DIAGNOSIS — E7800 Pure hypercholesterolemia, unspecified: Secondary | ICD-10-CM | POA: Diagnosis not present

## 2023-02-02 DIAGNOSIS — E1165 Type 2 diabetes mellitus with hyperglycemia: Secondary | ICD-10-CM | POA: Diagnosis not present

## 2023-02-02 DIAGNOSIS — I10 Essential (primary) hypertension: Secondary | ICD-10-CM

## 2023-02-02 MED ORDER — ALLOPURINOL 100 MG PO TABS
100.0000 mg | ORAL_TABLET | Freq: Every day | ORAL | 1 refills | Status: DC
Start: 2023-02-02 — End: 2023-07-27

## 2023-02-02 NOTE — Assessment & Plan Note (Signed)
Stable. Continue Rosuvastatin  10mg  daily. Ordered lipid panel. Patient is not fasting and will make lab appointment when fasting.

## 2023-02-02 NOTE — Patient Instructions (Addendum)
-  Discontinue Gabapentin for neuropathic pain at the request of patient. Recommend for the first 2 weeks, decrease Gabapentin to 300mg  once a day, instead of twice a day. On the third week, take 1 tablet every other day. If pain, numbness, or tingling starts back, then call the office or send a Mychart message.  -Follow up with an AWV appointment. Will need a fasting lab appointment on that day.  -Follow up with neurology.  -Continue all other medications. Refilled Allopurinol.  -Follow up in 8 months for chronic management.  -Declines antibiotic for cyst on back. If it does not get better or develop fever, call back to the office or send a MyChart message. Keep area clean with Dial antibacterial soap. Pat dry. Keep covered if it starts to drainage.

## 2023-02-02 NOTE — Assessment & Plan Note (Signed)
Controlled. Refilled Allopurinol. Denies needing a refill on Colchicine if he has a gout attack.

## 2023-02-02 NOTE — Assessment & Plan Note (Signed)
Controlled. Continue Metformin 500mg  daily. Ordered A1c. If A1c is good, will discontinue Metformin and will continue to monitor A1c.

## 2023-02-02 NOTE — Progress Notes (Signed)
Established Patient Office Visit   Subjective:  Patient ID: Justin Mason, male    DOB: 21-Sep-1980  Age: 42 y.o. MRN: [id number]  Chief Complaint  Patient presents with   Follow-up    Pt is here today to F/U.     HPI HTN: Chronic. Patient is taking Chlorthalidone 25mg  daily, Hydralazine 50mg  BID, Irbesartan 300mg  daily, and Metoprolol Tartrate 50mg  BID. Blood pressure is controlled with no complaints. At previous cardiology appointment in February, his Metoprolol dose was decreased due to heart rate being on the low normal side and borderline low blood pressure.  BP Readings from Last 3 Encounters:  02/02/23 124/78  09/30/22 132/74  09/02/22 110/70    Diabetes: Chronic. Patient is taking Metformin 500mg  once a day. This was decreased from 2 tablets since previous visit. Needs eye exam and foot exam. Denies any complications with medication or diabetes.  Lab Results  Component Value Date   HGBA1C 5.4 10/06/2022     Hyperlipidemia: Chronic. Patient is taking Rosuvastation 10mg  daily. No complications with taking medication. Patient is not fasting to have labs drawn today.  Lab Results  Component Value Date   CHOL 103 10/06/2022   HDL 39.20 10/06/2022   LDLCALC 31 10/06/2022   TRIG 166.0 (H) 10/06/2022   CHOLHDL 3 10/06/2022    Gout: Patient takes Allopurinol 100mg  daily, but been out of medication for about 2 weeks. Patient does have Colchicine 0.6 mg if needed for gout. Last time he took medication was about a month ago. He reports he took it for left ankle.   Neuropathic pain-Patient was started on Gabapentin 100mg  TID, but at some time, he was transitioned to 300 mg BID. Medication was initially started back in 09/2020 while in the hospital. Patient would like to come off medication. He denies numbness or tingling or pain.   Patient is under the care of [facility]. His last appointment was in 03/2022 with a 6 month follow up. He would like to get off Keppra that he was  prescribed for seizure like activity. He reports he has not been pleased with his care at their office, but has agreed to go one more visit.  ROS See HPI above     Objective:   BP 124/78   Pulse 72   Temp 98.2 F (36.8 C)   Ht 6\' 2"  (1.88 m)   Wt (!) 332 lb 8 oz (150.8 kg)   SpO2 99%   BMI 42.69 kg/m    Physical Exam Vitals reviewed.  Constitutional:      General: He is not in acute distress.    Appearance: Normal appearance. He is not ill-appearing, toxic-appearing or diaphoretic.  Eyes:     General:        Right eye: No discharge.        Left eye: No discharge.     Conjunctiva/sclera: Conjunctivae normal.  Cardiovascular:     Rate and Rhythm: Normal rate and regular rhythm.     Heart sounds: Normal heart sounds. No murmur heard.    No friction rub. No gallop.  Pulmonary:     Effort: Pulmonary effort is normal. No respiratory distress.     Breath sounds: Normal breath sounds.  Skin:    General: Skin is warm and dry.     Comments: Cyst on left posterior shoulder-see image.   Neurological:     General: No focal deficit present.     Mental Status: He is alert and oriented to person,  place, and time. Mental status is at baseline.     Gait: Gait abnormal (Gain).  Psychiatric:        Mood and Affect: Mood normal.        Behavior: Behavior normal.        Thought Content: Thought content normal.        Judgment: Judgment normal.       Assessment & Plan:  Essential hypertension Assessment & Plan: Blood pressure is stable. Continue Chlorthalidone 25mg  daily, Hydralazine 50mg  BID, Irbesartan 300mg  daily, and Metoprolol Tartrate 50mg  BID. Ordered BMP to assess kidney function and potassium level. Patient is not fasting for lipids, he is going to come back for labs when fasting.   Orders: -     Basic metabolic panel; Future  Controlled type 2 diabetes mellitus with hyperglycemia, without long-term current use of insulin (HCC) Assessment & Plan: Controlled. Continue  Metformin 500mg  daily. Ordered A1c. If A1c is good, will discontinue Metformin and will continue to monitor A1c.   Orders: -     Hemoglobin A1c; Future  High cholesterol Assessment & Plan: Stable. Continue Rosuvastatin  10mg  daily. Ordered lipid panel. Patient is not fasting and will make lab appointment when fasting.   Orders: -     Lipid panel; Future  Controlled gout Assessment & Plan: Controlled. Refilled Allopurinol. Denies needing a refill on Colchicine if he has a gout attack.   Orders: -     Allopurinol; Take 1 tablet (100 mg total) by mouth daily.  Dispense: 90 tablet; Refill: 1  Epidermoid cyst of skin of back  1.Review health maintenance:  -Schedule AWV visit, 1st in office -Declines covid booster 2.Discontinue Gabapentin for neuropathic pain at the request of patient. Recommend for the first 2 weeks, decrease Gabapentin to 300mg  once a day, instead of twice a day. On the third week, take 1 tablet every other day. If pain, numbness, or tingling starts back, then call the office or send a Mychart message.  3.Follow up with [facility] about discontinuing Keppra. Patient denies having any seizures or seizure like activity.   4.Declines antibiotic for cyst on back. If it does not get better or develop fever, call back to the office or send a MyChart message. Keep area clean with Dial antibacterial soap. Pat dry. Keep covered if it starts to drainage.  Return in about 8 months (around 10/03/2023) for chronic management; Needs 1st AWV visit in office .   Zandra Abts, NP

## 2023-02-02 NOTE — Assessment & Plan Note (Signed)
Blood pressure is stable. Continue Chlorthalidone 25mg  daily, Hydralazine 50mg  BID, Irbesartan 300mg  daily, and Metoprolol Tartrate 50mg  BID. Ordered BMP to assess kidney function and potassium level. Patient is not fasting for lipids, he is going to come back for labs when fasting.

## 2023-02-05 ENCOUNTER — Other Ambulatory Visit: Payer: Self-pay | Admitting: Family Medicine

## 2023-02-05 DIAGNOSIS — K219 Gastro-esophageal reflux disease without esophagitis: Secondary | ICD-10-CM

## 2023-02-09 ENCOUNTER — Other Ambulatory Visit: Payer: Self-pay | Admitting: Family Medicine

## 2023-03-15 ENCOUNTER — Other Ambulatory Visit: Payer: Self-pay | Admitting: Adult Health

## 2023-03-23 ENCOUNTER — Telehealth: Payer: Self-pay | Admitting: Cardiology

## 2023-03-23 MED ORDER — ROSUVASTATIN CALCIUM 10 MG PO TABS: 10.0000 mg | ORAL_TABLET | Freq: Every day | ORAL | 1 refills | Status: DC

## 2023-03-23 NOTE — Telephone Encounter (Signed)
Pt's mother states one of his medications keeps getting denied. She is requesting a Crestor refill be sent to [employer] store. N

## 2023-03-23 NOTE — Telephone Encounter (Signed)
Patient's mother is requesting a call back from the nurse. Patient's mother did not state what it was in regards to.

## 2023-05-13 ENCOUNTER — Other Ambulatory Visit: Payer: Self-pay | Admitting: Family Medicine

## 2023-05-13 DIAGNOSIS — K219 Gastro-esophageal reflux disease without esophagitis: Secondary | ICD-10-CM

## 2023-06-08 ENCOUNTER — Other Ambulatory Visit: Payer: Self-pay

## 2023-06-08 MED ORDER — CHLORTHALIDONE 25 MG PO TABS: 25.0000 mg | ORAL_TABLET | Freq: Every day | ORAL | 4 refills | Status: DC

## 2023-06-14 ENCOUNTER — Other Ambulatory Visit: Payer: Self-pay | Admitting: Adult Health

## 2023-07-13 ENCOUNTER — Other Ambulatory Visit: Payer: Self-pay | Admitting: Cardiology

## 2023-07-27 ENCOUNTER — Other Ambulatory Visit: Payer: Self-pay | Admitting: Family Medicine

## 2023-07-27 DIAGNOSIS — M109 Gout, unspecified: Secondary | ICD-10-CM

## 2023-07-28 NOTE — Progress Notes (Signed)
Cardiology Office Note:    Date:  08/02/2023   ID:  [name], DOB [birth year], MRN [id number]  PCP:  Alveria Apley, NP   [facility] Providers Cardiologist:  Lenisha Lacap [place], MD { Referring MD: Alveria Apley, NP   Chief Complaint  Patient presents with   Hypertension   Hyperlipidemia     History of Present Illness:    Justin Mason is a 43 y.o. male with a hx of hypertension, hyperlipidemia, DM2, s/p CVA, obstructive sleep apnea and morbid obesity.  He has a history of difficult to control high blood pressure and has been followed by Dr. Tomie China.  Echocardiogram obtained on 07/04/2020 showed EF 60 to 65%, grade 1 DD.  His last visit with Dr. Tomie China was in January 2022, he was started on clonidine 0.1 mg twice a day.  He was diagnosed with COVID-19 infection in January and subsequently went to the hospital on 07/24/2020 with aphasia, right facial droop and right upper extremity weakness.  CT was concerning for left sided infarct.  CTA showed short segment occlusion in MCA, left ICA occlusion proximal left M1 and stenosis of PCAs.  He was outside the window for tPA.  Attempted angioplasty of MCA by interventional radiology was unsuccessful due to resistance, suspect occlusion to be chronic.  Repeat echocardiogram shows EF of 60 to 65%.  He was prescribed aspirin and Plavix with plan to continue for 3 months then transition to aspirin alone.  During inpatient rehab, HCTZ was discontinued Avapro was decreased to 150 mg daily to avoid hypotension.  Diabetic medication was reduced due to periods of hypoglycemic episodes.  Bilateral lower extremity Doppler was negative for DVT.  Patient was treated with 7 days of antibiotic due to E. coli UTI.  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  He also had a gout flare during recovery as well.  Patient was eventually discharged following inpatient  rehab in early March 2022.  Plavix was discontinued on 5/16 after seeing Dr. Pearlean Brownie for follow-up.  On follow up today he is doing very well. Walks with a cane. Mother states he can do whatever he wants. No dyspnea, palpitations, chest pain or edema. BP is in excellent control.    Past Medical History:  Diagnosis Date   Acid reflux    AKI (acute kidney injury) (HCC)    Arthritis    Cardiac murmur 05/24/2020   Cellulitis 01/03/2015   Cellulitis of right leg    Cellulitis of right lower extremity 01/03/2015   COVID    Depression    Diabetes (HCC)    Pre Diabetes   Essential hypertension    GERD (gastroesophageal reflux disease)    Gout    Gout    High cholesterol    Hyperlipemia    Hypertension    Morbid obesity (HCC) 05/24/2020   OSA (obstructive sleep apnea)    Rotator cuff tear, right 09/08/2011   Seizures (HCC)    Sepsis (HCC) 01/03/2015   Stroke (HCC)    Stroke (HCC)    Type 2 diabetes mellitus with hyperglycemia (HCC)     Past Surgical History:  Procedure Laterality Date   blood clot left side brain     Frontal   INCISION AND DRAINAGE FOOT  1991-1992   STEPPED ON NAIL / INFECTED WOUND   IR ANGIO INTRA EXTRACRAN SEL COM CAROTID INNOMINATE BILAT MOD SED  07/24/2020   IR ANGIO VERTEBRAL SEL SUBCLAVIAN INNOMINATE UNI L MOD SED  07/24/2020   IR ANGIO VERTEBRAL SEL VERTEBRAL UNI R MOD SED  07/24/2020   left foot surgery     age 29   RADIOLOGY WITH ANESTHESIA N/A 07/24/2020   Procedure: IR WITH ANESTHESIA - CODE STROKE;  Surgeon: Radiologist, Medication, MD;  Location: [facility] OR;  Service: Radiology;  Laterality: N/A;   SHOULDER SURGERY     September 08, 2011    Current Medications: Current Meds  Medication Sig   acetaminophen (TYLENOL) 325 MG tablet Take 1-2 tablets (325-650 mg total) by mouth every 4 (four) hours as needed for mild pain.   allopurinol (ZYLOPRIM) 100 MG tablet TAKE 1 TABLET BY MOUTH DAILY   Ascorbic Acid (VITAMIN C PO) Take 1 tablet by mouth every  evening.   aspirin EC 81 MG tablet Take 81 mg by mouth daily. Swallow whole.   baclofen (LIORESAL) 10 MG tablet TAKE 1 TABLET BY MOUTH 3 TIMES DAILY   Cholecalciferol (VITAMIN D-3) 25 MCG (1000 UT) CAPS Take 1,000 Units by mouth every evening.   colchicine 0.6 MG tablet Take 1 tablet (0.6 mg total) by mouth daily.   diclofenac Sodium (VOLTAREN) 1 % GEL Apply 2 g topically 4 (four) times daily.   irbesartan (AVAPRO) 300 MG tablet TAKE 1 TABLET BY MOUTH DAILY   levETIRAcetam (KEPPRA) 750 MG tablet TAKE 1 TABLET BY MOUTH TWICE DAILY   metFORMIN (GLUCOPHAGE-XR) 500 MG 24 hr tablet Take 500 mg by mouth daily.   Multiple Vitamin (MULTIVITAMIN WITH MINERALS) TABS tablet Take 1 tablet by mouth daily.   pantoprazole (PROTONIX) 40 MG tablet TAKE 1 TABLET BY MOUTH DAILY   sertraline (ZOLOFT) 25 MG tablet Take 25 mg by mouth daily.   zinc gluconate 50 MG tablet Take 50 mg by mouth daily.   [DISCONTINUED] chlorthalidone (HYGROTON) 25 MG tablet Take 1 tablet (25 mg total) by mouth daily.   [DISCONTINUED] hydrALAZINE (APRESOLINE) 50 MG tablet TAKE 1 TABLET BY MOUTH TWICE DAILY   [DISCONTINUED] rosuvastatin (CRESTOR) 10 MG tablet Take 1 tablet (10 mg total) by mouth daily.     Allergies:   Patient has no known allergies.   Social History   Socioeconomic History   Marital status: Single    Spouse name: Not on file   Number of children: Not on file   Years of education: Not on file   Highest education level: Not on file  Occupational History   Not on file  Tobacco Use   Smoking status: Never   Smokeless tobacco: Current    Types: Chew  Vaping Use   Vaping status: Never Used  Substance and Sexual Activity   Alcohol use: Not Currently    Comment: 1x week   Drug use: No   Sexual activity: Not Currently  Other Topics Concern   Not on file  Social History Narrative   Lives with mom and girlfriend   Right Handed   Drinks caffeinated tea   Social Drivers of Corporate investment banker  Strain: Not on file  Food Insecurity: Not on file  Transportation Needs: Not on file  Physical Activity: Not on file  Stress: Not on file  Social Connections: Not on file     Family History: The patient's family history includes Anesthesia problems in his sister; Arthritis in his mother; Asthma in his mother; COPD in his mother; Colon cancer in his maternal grandfather and another family member; Gout in his  father; Heart attack in his mother; Heart disease in his mother; Hyperlipidemia in his mother; Prostate cancer in an other family member; Stroke in his maternal grandfather; Vision loss in his mother.  ROS:   Please see the history of present illness.     All other systems reviewed and are negative.  EKGs/Labs/Other Studies Reviewed:    The following studies were reviewed today:  Echo 07/25/2020 1. Left ventricular ejection fraction, by estimation, is 60 to 65%. The  left ventricle has normal function. The left ventricle has no regional  wall motion abnormalities. There is moderate concentric left ventricular  hypertrophy. Left ventricular  diastolic parameters are indeterminate.   2. Right ventricular systolic function is normal. The right ventricular  size is normal.   3. The mitral valve is normal in structure. No evidence of mitral valve  regurgitation. No evidence of mitral stenosis.   4. The aortic valve is tricuspid. Aortic valve regurgitation is not  visualized. No aortic stenosis is present.   5. The inferior vena cava is dilated in size with >50% respiratory  variability, suggesting right atrial pressure of 8 mmHg.   Comparison(s): No significant change from prior study.   EKG Interpretation Date/Time:  Monday August 02 2023 14:55:28 EST Ventricular Rate:  75 PR Interval:  192 QRS Duration:  96 QT Interval:  400 QTC Calculation: 446 R Axis:   -36  Text Interpretation: Normal sinus rhythm Left axis deviation Low voltage QRS Possible Anterolateral infarct , age  undetermined When compared with ECG of Sep 02, 2022 No significant change was found Confirmed by [place], Tollie [place] [id number]) on 08/02/2023 2:58:50 PM   Recent Labs: 10/06/2022: ALT 30 10/12/2022: BUN 11; Creatinine, Ser 0.86; Potassium 3.8 specimen hemolyzed; Sodium 134  Recent Lipid Panel    Component Value Date/Time   CHOL 103 10/06/2022 0841   TRIG 166.0 (H) 10/06/2022 0841   HDL 39.20 10/06/2022 0841   CHOLHDL 3 10/06/2022 0841   VLDL 33.2 10/06/2022 0841   LDLCALC 31 10/06/2022 0841     Risk Assessment/Calculations:       Physical Exam:    VS:  BP 126/70   Pulse 75   Ht 6\' 1"  (1.854 m)   Wt (!) 340 lb (154.2 kg)   SpO2 97%   BMI 44.86 kg/m     Wt Readings from Last 3 Encounters:  08/02/23 (!) 340 lb (154.2 kg)  02/02/23 (!) 332 lb 8 oz (150.8 kg)  09/30/22 (!) 328 lb (148.8 kg)     GEN:  Well nourished, obese,in no acute distress HEENT: Normal NECK: No JVD; No carotid bruits LYMPHATICS: No lymphadenopathy CARDIAC: RRR, no murmurs, rubs, gallops RESPIRATORY:  Clear to auscultation without rales, wheezing or rhonchi  ABDOMEN: Soft, non-tender, non-distended MUSCULOSKELETAL:  No edema; No deformity  SKIN: Warm and dry NEUROLOGIC:  Alert and oriented x 3 PSYCHIATRIC:  Normal affect   ASSESSMENT:    1. Essential hypertension   2. Hyperlipidemia LDL goal <70   3. History of CVA (cerebrovascular accident)      PLAN:    In order of problems listed above:  History of CVA:  Patient is still has some right-sided weakness.  Excellent progress and very functional.  Hypertension: Blood pressure is under excellent control. Meds refilled.   Hyperlipidemia: Continue Crestor. Last LDL excellent. Due for follow up labs with PCP  DM2: now on just low dose metformin.   Morbid obesity: encourage weight loss.         Medication  Adjustments/Labs and Tests Ordered: Current medicines are reviewed at length with the patient today.  Concerns regarding medicines are outlined  above.  Orders Placed This Encounter  Procedures   EKG 12-Lead    Meds ordered this encounter  Medications   hydrALAZINE (APRESOLINE) 50 MG tablet    Sig: Take 1 tablet (50 mg total) by mouth 2 (two) times daily.    Dispense:  180 tablet    Refill:  3   metoprolol tartrate (LOPRESSOR) 50 MG tablet    Sig: Take 1 tablet (50 mg total) by mouth 2 (two) times daily.    Dispense:  180 tablet    Refill:  3   chlorthalidone (HYGROTON) 25 MG tablet    Sig: Take 1 tablet (25 mg total) by mouth daily.    Dispense:  90 tablet    Refill:  3   rosuvastatin (CRESTOR) 10 MG tablet    Sig: Take 1 tablet (10 mg total) by mouth daily.    Dispense:  90 tablet    Refill:  3     There are no Patient Instructions on file for this visit.   Signed, Kharma Sampsel [place], MD  08/02/2023 3:15 PM    [facility]

## 2023-08-02 ENCOUNTER — Ambulatory Visit: Payer: Medicare (Managed Care) | Attending: Cardiology | Admitting: Cardiology

## 2023-08-02 ENCOUNTER — Encounter: Payer: Self-pay | Admitting: Cardiology

## 2023-08-02 VITALS — BP 126/70 | HR 75 | Ht 73.0 in | Wt 340.0 lb

## 2023-08-02 DIAGNOSIS — I10 Essential (primary) hypertension: Secondary | ICD-10-CM

## 2023-08-02 DIAGNOSIS — E785 Hyperlipidemia, unspecified: Secondary | ICD-10-CM

## 2023-08-02 DIAGNOSIS — Z8673 Personal history of transient ischemic attack (TIA), and cerebral infarction without residual deficits: Secondary | ICD-10-CM

## 2023-08-02 MED ORDER — HYDRALAZINE HCL 50 MG PO TABS: 50.0000 mg | ORAL_TABLET | Freq: Two times a day (BID) | ORAL | 3 refills | Status: AC

## 2023-08-02 MED ORDER — METOPROLOL TARTRATE 50 MG PO TABS: 50.0000 mg | ORAL_TABLET | Freq: Two times a day (BID) | ORAL | 3 refills | Status: AC

## 2023-08-02 MED ORDER — CHLORTHALIDONE 25 MG PO TABS: 25.0000 mg | ORAL_TABLET | Freq: Every day | ORAL | 3 refills | Status: DC

## 2023-08-02 MED ORDER — ROSUVASTATIN CALCIUM 10 MG PO TABS: 10.0000 mg | ORAL_TABLET | Freq: Every day | ORAL | 3 refills | Status: DC

## 2023-08-02 NOTE — Patient Instructions (Signed)
Medication Instructions:  Your physician recommends that you continue on your current medications as directed. Please refer to the Current Medication list given to you today.  *If you need a refill on your cardiac medications before your next appointment, please call your pharmacy*    Follow-Up: At [facility], you and your health needs are our priority.  As part of our continuing mission to provide you with exceptional heart care, we have created designated Provider Care Teams.  These Care Teams include your primary Cardiologist (physician) and Advanced Practice Providers (APPs -  Physician Assistants and Nurse Practitioners) who all work together to provide you with the care you need, when you need it.     Your next appointment:   12 month(s)  Provider:   Peter [place], MD

## 2023-08-10 ENCOUNTER — Other Ambulatory Visit: Payer: Self-pay | Admitting: Family Medicine

## 2023-08-10 DIAGNOSIS — K219 Gastro-esophageal reflux disease without esophagitis: Secondary | ICD-10-CM

## 2023-09-11 ENCOUNTER — Other Ambulatory Visit: Payer: Self-pay | Admitting: Adult Health

## 2023-09-13 NOTE — Telephone Encounter (Signed)
 Pt scheduled on 12/09/23 at 3:45 pm

## 2023-09-13 NOTE — Telephone Encounter (Signed)
 Last seen on 03/19/22 No follow up scheduled   Message sent to get pt scheduled.

## 2023-09-21 ENCOUNTER — Other Ambulatory Visit: Payer: Self-pay | Admitting: Family Medicine

## 2023-09-30 ENCOUNTER — Telehealth: Payer: Self-pay | Admitting: *Deleted

## 2023-09-30 NOTE — Telephone Encounter (Signed)
 Copied from CRM [id number]. Topic: Clinical - Request for Lab/Test Order >> Sep 30, 2023  2:29 PM Justin Mason wrote: Reason for CRM: Patient mom states that patient has an appointment on 03/31 at [facility] and wants to know if an order for lab can be placed at [place] instead of [place]. She is afraid that they won't make it in time for lab @ 10:45 at [facility] .

## 2023-10-04 ENCOUNTER — Other Ambulatory Visit: Payer: Medicare (Managed Care)

## 2023-10-04 ENCOUNTER — Ambulatory Visit (INDEPENDENT_AMBULATORY_CARE_PROVIDER_SITE_OTHER): Payer: Medicare (Managed Care) | Admitting: Family Medicine

## 2023-10-04 ENCOUNTER — Encounter: Payer: Self-pay | Admitting: Family Medicine

## 2023-10-04 VITALS — BP 126/80 | HR 75 | Temp 97.5°F | Ht 73.0 in | Wt 341.0 lb

## 2023-10-04 DIAGNOSIS — M109 Gout, unspecified: Secondary | ICD-10-CM | POA: Diagnosis not present

## 2023-10-04 DIAGNOSIS — F32A Depression, unspecified: Secondary | ICD-10-CM

## 2023-10-04 DIAGNOSIS — K219 Gastro-esophageal reflux disease without esophagitis: Secondary | ICD-10-CM

## 2023-10-04 DIAGNOSIS — M1990 Unspecified osteoarthritis, unspecified site: Secondary | ICD-10-CM

## 2023-10-04 DIAGNOSIS — E7800 Pure hypercholesterolemia, unspecified: Secondary | ICD-10-CM

## 2023-10-04 DIAGNOSIS — E1165 Type 2 diabetes mellitus with hyperglycemia: Secondary | ICD-10-CM | POA: Diagnosis not present

## 2023-10-04 DIAGNOSIS — Z7984 Long term (current) use of oral hypoglycemic drugs: Secondary | ICD-10-CM

## 2023-10-04 DIAGNOSIS — I10 Essential (primary) hypertension: Secondary | ICD-10-CM

## 2023-10-04 LAB — COMPREHENSIVE METABOLIC PANEL WITH GFR
ALT: 68 U/L — ABNORMAL HIGH (ref 0–53)
AST: 45 U/L — ABNORMAL HIGH (ref 0–37)
Albumin: 4.6 g/dL (ref 3.5–5.2)
Alkaline Phosphatase: 60 U/L (ref 39–117)
BUN: 11 mg/dL (ref 6–23)
CO2: 27 meq/L (ref 19–32)
Calcium: 9.9 mg/dL (ref 8.4–10.5)
Chloride: 97 meq/L (ref 96–112)
Creatinine, Ser: 0.92 mg/dL (ref 0.40–1.50)
GFR: 102.6 mL/min (ref >60.00)
Glucose, Bld: 152 mg/dL — ABNORMAL HIGH (ref 70–99)
Potassium: 3.1 meq/L — ABNORMAL LOW (ref 3.5–5.1)
Sodium: 137 meq/L (ref 135–145)
Total Bilirubin: 0.8 mg/dL (ref 0.2–1.2)
Total Protein: 8.1 g/dL (ref 6.0–8.3)

## 2023-10-04 LAB — LIPID PANEL
Cholesterol: 98 mg/dL (ref 0–200)
HDL: 33.3 mg/dL — ABNORMAL LOW (ref >39.00)
LDL Cholesterol: 37 mg/dL (ref 0–99)
NonHDL: 64.3
Total CHOL/HDL Ratio: 3
Triglycerides: 135 mg/dL (ref 0.0–149.0)
VLDL: 27 mg/dL (ref 0.0–40.0)

## 2023-10-04 LAB — HEMOGLOBIN A1C: Hgb A1c MFr Bld: 6.4 % (ref 4.6–6.5)

## 2023-10-04 NOTE — Assessment & Plan Note (Signed)
 Stable. Continue Baclofen 10mg  BID.

## 2023-10-04 NOTE — Assessment & Plan Note (Signed)
 Controlled. Continue Allopurinol and Colchicine.

## 2023-10-04 NOTE — Assessment & Plan Note (Signed)
 Blood pressure is stable. Continue Chlorthalidone 25mg  daily, Hydralazine 50mg  BID, Irbesartan 300mg  daily, and Metoprolol Tartrate 50mg  BID. Ordered CMP to assess kidney function and potassium level.

## 2023-10-04 NOTE — Progress Notes (Signed)
 Established Patient Office Visit   Subjective:  Patient ID: Justin Mason, male    DOB: 07-04-1981  Age: 43 y.o. MRN: [id number]  Chief Complaint  Patient presents with   Medical Management of Chronic Issues    HPI HTN: Chronic. Patient is taking Chlorthalidone 25mg  daily, Hydralazine 50mg  BID, Irbesartan 300mg  daily, and Metoprolol Tartrate 50mg  BID. Blood pressure is controlled with no complaints.   BP Readings from Last 3 Encounters:  10/04/23 126/80  08/02/23 126/70  02/02/23 124/78    Diabetes: Chronic. Patient is taking Metformin 500mg  once a day. This was decreased from 2 tablets last year. Denies any complications with medication or diabetes.  Lab Results  Component Value Date   HGBA1C 6.4 10/04/2023     Hyperlipidemia: Chronic. Patient is taking Rosuvastation 10mg  daily. No complications with taking medication. Patient is not fasting to have labs drawn today.  Recent Labs       Lab Results  Component Value Date    CHOL 103 10/06/2022    HDL 39.20 10/06/2022    LDLCALC 31 10/06/2022    TRIG 166.0 (H) 10/06/2022    CHOLHDL 3 10/06/2022      Gout: Patient takes Allopurinol 100mg  daily with no recent flare ups. Patient does have Colchicine 0.6 mg if needed for gout.    Depression: Chronic. Patient is taking Sertraline 25mg  daily. Effective.   GERD: Chronic. Patient is taking Pantoprazole 40mg  daily. Effective.   Arthritis: Chronic. Patient is taking Baclofen 10mg  BID for pain. Effective.  ROS See HPI above     Objective:   BP 126/80   Pulse 75   Temp (!) 97.5 F (36.4 C) (Oral)   Ht 6\' 1"  (1.854 m)   Wt (!) 341 lb (154.7 kg)   SpO2 98%   BMI 44.99 kg/m    Physical Exam Vitals reviewed.  Constitutional:      General: He is not in acute distress.    Appearance: Normal appearance. He is obese. He is not ill-appearing, toxic-appearing or diaphoretic.  HENT:     Head: Normocephalic and atraumatic.  Eyes:     General:        Right eye: No discharge.         Left eye: No discharge.     Conjunctiva/sclera: Conjunctivae normal.  Cardiovascular:     Rate and Rhythm: Normal rate and regular rhythm.     Pulses:          Dorsalis pedis pulses are 2+ on the right side and 2+ on the left side.     Heart sounds: Normal heart sounds. No murmur heard.    No friction rub. No gallop.  Pulmonary:     Effort: Pulmonary effort is normal. No respiratory distress.     Breath sounds: Normal breath sounds.  Musculoskeletal:     Right hand: Decreased range of motion. Decreased strength.  Feet:     Right foot:     Protective Sensation: 10 sites tested.  10 sites sensed.     Skin integrity: Warmth and dry skin present.     Toenail Condition: Right toenails are long.     Left foot:     Protective Sensation: 10 sites tested.  10 sites sensed.     Skin integrity: Warmth and dry skin present.     Toenail Condition: Left toenails are long.     Comments: Discolored feet: Dusky blue Skin:    General: Skin is warm and dry.  Neurological:  General: No focal deficit present.     Mental Status: He is alert and oriented to person, place, and time. Mental status is at baseline.     Gait: Gait abnormal Randie Heinz).  Psychiatric:        Mood and Affect: Mood normal.        Behavior: Behavior normal.        Thought Content: Thought content normal.        Judgment: Judgment normal.      Assessment & Plan:  Essential hypertension Assessment & Plan: Blood pressure is stable. Continue Chlorthalidone 25mg  daily, Hydralazine 50mg  BID, Irbesartan 300mg  daily, and Metoprolol Tartrate 50mg  BID. Ordered CMP to assess kidney function and potassium level.   Orders: -     Comprehensive metabolic panel with GFR  Controlled type 2 diabetes mellitus with hyperglycemia, without long-term current use of insulin (HCC) Assessment & Plan: Controlled. Continue Metformin 500mg  daily. Ordered A1c. If A1c is good, will discontinue Metformin and will continue to monitor A1c. Foot  exam completed today. Patient is on an ARB and statin.     Orders: -     Comprehensive metabolic panel with GFR -     Hemoglobin A1c  Controlled gout Assessment & Plan: Controlled. Continue Allopurinol and Colchicine.      Depression, unspecified depression type Assessment & Plan: Stable. Continue with Sertraline 25mg  daily. Patient scored 1 on PHQ-9 screening and 0 on GAD-7 screening. Denies SI or HI    High cholesterol Assessment & Plan: Stable. Continue Rosuvastatin 10mg  daily. Ordered lipid panel and CMP to assess liver function.     Orders: -     Comprehensive metabolic panel with GFR -     Lipid panel  Gastroesophageal reflux disease without esophagitis Assessment & Plan: Controlled. Continue Pantoprazole 40mg  daily.        Arthritis Assessment & Plan: Stable. Continue Baclofen 10mg  BID.     1.Review health maintenance:  -Covid booster: Declined -AWV: Needs visit   Return in about 6 months (around 04/04/2024) for physical: AWV-telehealth .   Zandra Abts, NP

## 2023-10-04 NOTE — Assessment & Plan Note (Addendum)
 Controlled. Continue Metformin 500mg  daily. Ordered A1c. If A1c is good, will discontinue Metformin and will continue to monitor A1c. Foot exam completed today. Patient is on an ARB and statin.

## 2023-10-04 NOTE — Assessment & Plan Note (Signed)
 Stable. Continue Rosuvastatin 10mg  daily. Ordered lipid panel and CMP to assess liver function.

## 2023-10-04 NOTE — Assessment & Plan Note (Signed)
 Stable. Continue with Sertraline 25mg  daily. Patient scored 1 on PHQ-9 screening and 0 on GAD-7 screening. Denies SI or HI

## 2023-10-04 NOTE — Assessment & Plan Note (Signed)
 Controlled. Continue Pantoprazole 40mg  daily.

## 2023-10-04 NOTE — Patient Instructions (Signed)
-  It was great to see you today. I am still so proud of how far you have came. -Continue all prescribed medications.  -Ordered labs. Office will call with lab results and you will see them on MyChart.  -Follow up in 6 months for a physical and you need your Annual Wellness Visit via telehealth.

## 2023-10-05 ENCOUNTER — Encounter: Payer: Self-pay | Admitting: Family Medicine

## 2023-10-05 ENCOUNTER — Other Ambulatory Visit: Payer: Self-pay

## 2023-10-05 DIAGNOSIS — E876 Hypokalemia: Secondary | ICD-10-CM

## 2023-10-05 DIAGNOSIS — R748 Abnormal levels of other serum enzymes: Secondary | ICD-10-CM

## 2023-10-05 MED ORDER — POTASSIUM CHLORIDE CRYS ER 10 MEQ PO TBCR
10.0000 meq | EXTENDED_RELEASE_TABLET | Freq: Every day | ORAL | 0 refills | Status: DC
Start: 2023-10-05 — End: 2023-10-20

## 2023-10-11 ENCOUNTER — Other Ambulatory Visit: Payer: Self-pay

## 2023-10-11 ENCOUNTER — Telehealth: Payer: Self-pay

## 2023-10-11 ENCOUNTER — Other Ambulatory Visit: Payer: Self-pay | Admitting: Family Medicine

## 2023-10-11 DIAGNOSIS — M109 Gout, unspecified: Secondary | ICD-10-CM

## 2023-10-11 MED ORDER — ALLOPURINOL 100 MG PO TABS: 100.0000 mg | ORAL_TABLET | Freq: Every day | ORAL | 1 refills | Status: DC

## 2023-10-11 NOTE — Telephone Encounter (Signed)
 Copied from CRM [id number]. Topic: Clinical - Prescription Issue >> Oct 11, 2023  3:22 PM Elizebeth Brooking wrote: Reason for CRM: Patient mom called in regarding potassium stating she does know if that is what is causing the gout, she would like for a nurse to call her back regarding this at  [phone]

## 2023-10-11 NOTE — Telephone Encounter (Unsigned)
 Copied from CRM [id number]. Topic: Clinical - Medication Refill >> Oct 11, 2023  3:27 PM Elizebeth Brooking wrote: Most Recent Primary Care Visit:  Provider: Alveria Apley  Department: [facility]  Visit Type: OFFICE VISIT  Date: 10/04/2023  Medication: colchicine 0.6 MG tablet  Has the patient contacted their pharmacy? Yes (Agent: If no, request that the patient contact the pharmacy for the refill. If patient does not wish to contact the pharmacy document the reason why and proceed with request.) (Agent: If yes, when and what did the pharmacy advise?)  Is this the correct pharmacy for this prescription? Yes If no, delete pharmacy and type the correct one.  This is the patient's preferred pharmacy:  [facility] - [place], [place] - [address] [place] [address] Phone: [phone] Fax: [phone]   Has the prescription been filled recently? No  Is the patient out of the medication? Yes  Has the patient been seen for an appointment in the last year OR does the patient have an upcoming appointment? Yes  Can we respond through MyChart? Yes  Agent: Please be advised that Rx refills may take up to 3 business days. We ask that you follow-up with your pharmacy.

## 2023-10-12 ENCOUNTER — Other Ambulatory Visit: Payer: Self-pay

## 2023-10-12 DIAGNOSIS — M109 Gout, unspecified: Secondary | ICD-10-CM

## 2023-10-12 MED ORDER — COLCHICINE 0.6 MG PO TABS: ORAL_TABLET | ORAL | 3 refills | Status: AC

## 2023-10-12 NOTE — Telephone Encounter (Signed)
 Called pt mother to inform her of this information and she is aware

## 2023-10-19 ENCOUNTER — Other Ambulatory Visit: Payer: Medicare (Managed Care)

## 2023-10-19 ENCOUNTER — Encounter: Payer: Self-pay | Admitting: Family Medicine

## 2023-10-19 DIAGNOSIS — E876 Hypokalemia: Secondary | ICD-10-CM | POA: Diagnosis not present

## 2023-10-19 DIAGNOSIS — R748 Abnormal levels of other serum enzymes: Secondary | ICD-10-CM

## 2023-10-19 LAB — COMPREHENSIVE METABOLIC PANEL WITH GFR
ALT: 65 U/L — ABNORMAL HIGH (ref 0–53)
AST: 39 U/L — ABNORMAL HIGH (ref 0–37)
Albumin: 4.7 g/dL (ref 3.5–5.2)
Alkaline Phosphatase: 57 U/L (ref 39–117)
BUN: 10 mg/dL (ref 6–23)
CO2: 27 meq/L (ref 19–32)
Calcium: 9.6 mg/dL (ref 8.4–10.5)
Chloride: 97 meq/L (ref 96–112)
Creatinine, Ser: 0.96 mg/dL (ref 0.40–1.50)
GFR: 97.46 mL/min (ref >60.00)
Glucose, Bld: 142 mg/dL — ABNORMAL HIGH (ref 70–99)
Potassium: 3.1 meq/L — ABNORMAL LOW (ref 3.5–5.1)
Sodium: 136 meq/L (ref 135–145)
Total Bilirubin: 0.7 mg/dL (ref 0.2–1.2)
Total Protein: 7.7 g/dL (ref 6.0–8.3)

## 2023-10-19 LAB — POTASSIUM: Potassium: 3.1 meq/L — ABNORMAL LOW (ref 3.5–5.1)

## 2023-10-20 ENCOUNTER — Other Ambulatory Visit: Payer: Self-pay | Admitting: *Deleted

## 2023-10-20 DIAGNOSIS — E876 Hypokalemia: Secondary | ICD-10-CM

## 2023-10-20 MED ORDER — POTASSIUM CHLORIDE CRYS ER 10 MEQ PO TBCR: EXTENDED_RELEASE_TABLET | ORAL | 0 refills | Status: DC

## 2023-11-04 ENCOUNTER — Other Ambulatory Visit: Payer: Self-pay

## 2023-11-04 ENCOUNTER — Other Ambulatory Visit: Payer: Self-pay | Admitting: Family Medicine

## 2023-11-04 ENCOUNTER — Encounter: Payer: Self-pay | Admitting: Family Medicine

## 2023-11-04 ENCOUNTER — Telehealth: Payer: Self-pay | Admitting: Family Medicine

## 2023-11-04 ENCOUNTER — Other Ambulatory Visit: Payer: Medicare (Managed Care)

## 2023-11-04 DIAGNOSIS — Z0279 Encounter for issue of other medical certificate: Secondary | ICD-10-CM

## 2023-11-04 DIAGNOSIS — E876 Hypokalemia: Secondary | ICD-10-CM

## 2023-11-04 LAB — BASIC METABOLIC PANEL WITH GFR
BUN: 11 mg/dL (ref 6–23)
CO2: 30 meq/L (ref 19–32)
Calcium: 9.8 mg/dL (ref 8.4–10.5)
Chloride: 95 meq/L — ABNORMAL LOW (ref 96–112)
Creatinine, Ser: 0.91 mg/dL (ref 0.40–1.50)
GFR: 103.89 mL/min (ref >60.00)
Glucose, Bld: 241 mg/dL — ABNORMAL HIGH (ref 70–99)
Potassium: 3.5 meq/L (ref 3.5–5.1)
Sodium: 137 meq/L (ref 135–145)

## 2023-11-04 MED ORDER — POTASSIUM CHLORIDE CRYS ER 10 MEQ PO TBCR: EXTENDED_RELEASE_TABLET | ORAL | 0 refills | Status: DC

## 2023-11-04 NOTE — Telephone Encounter (Signed)
 Justin Mason dropped off document Supplemental report of disability to be filled out by provider. Patient requested to send it back via Fax within 7-days. Document is located in providers tray at front office.Please advise at [facility] [phone]

## 2023-11-04 NOTE — Telephone Encounter (Signed)
 Noted.

## 2023-11-04 NOTE — Progress Notes (Signed)
 [facility] [address] [place]. [place] [address] [phone]       OFFICE FOLLOW-UP NOTE  Mr. Justin Mason Date of Birth:  1980/12/23 Medical Record Number:  [id number]   Primary neurologist: Dr. Janett Medin Reason for visit: Stroke and seizure follow-up   Chief Complaint  Patient presents with   Seizures    Room 3. Pt is here with his Mother. Pt's mother states that he has been stable.       HPI:   Update 11/05/2023 JM: Patient returns for follow-up visit accompanied by his mother. Prior visit over 1.5 years ago.  Overall stable from neurological standpoint.  Seizures remain well-controlled on Keppra  750 mg twice daily, denies side effects.  Mother questions possibly reducing dosage.  Continued right sided weakness and aphasia which has been stable.  Mother questions other treatment options for continued right hand contractures.  He tries to stay active, he continues to drive without difficulty and maintains ADLs independently.  No new stroke/TIA symptoms.  Currently using a cane majority of the time, occasionally will still use Rolator walker for more long distance walking, no recent falls.  Reports compliance on aspirin  and Crestor .  Routinely follows with PCP and cardiology.  Continued use of chewing tobacco, usually 1 can per day.     History provided for reference purposes only Update 03/19/2022 JM: Patient returns for 38-month stroke and seizure follow-up accompanied by his mother.  Overall stable from neurological standpoint.  Denies any new stroke/TIA symptoms or seizure activity.  Compliant on Keppra  750 mg twice daily.  Continued right-sided weakness and aphasia, stable since prior visit.  Use of rollator walker outdoors and cane indoors, denies any recent falls. Is able to ambulate short distance without AD. Has been doing exercises routinely at home. He does c/o right hand finger contractures, previously had a brace but doesn't think it helps with fingers.  Compliant on aspirin  and Crestor .  Blood pressure well controlled.  Closely follows with PCP Lugenia Said, NP. Mother reports recent lab work at PCP office who plans on sending to us  for further review.  No further concerns at this time.  Update 09/16/2021 JM: Patient returns patient returns for stroke and seizure follow-up  accompanied by his mother.  Per epic review, he was seen in the ED on 09/08/2021 for syncope vs seizure episode after have a lab draw completed at his PCP office. Per ED note review, episode of loss of consciousness after blood draw without any specific seizure activity.  No confusion or mental status change upon coming to. He was found to be slightly dehydrated per lab work. CTH unremarkable.  ED increased keppra  dose to 750mg  BID and d/c'd home. Does report possible seizure in Feb after waking up with incontinence and biting his tongue.  Reports compliance on Keppra  at that time without missing any dosages, any changes in medications or any other provoking factor.  First onset of seizure type activity back in November where Keppra  was started.  EEG completed which was unremarkable.  Requested completion of MRI but not completed due to financial reasons. Currently, he feels back to baseline functioning.  Tolerating increased dose of Keppra  without side effects.  Therapies currently on hold due to recent events but plans on restarting soon.  Does report some improvement of right-sided weakness and aphasia.  Ambulates with rollator walker outdoors but does use cane indoors, denies any recent falls.  Denies new stroke/TIA symptoms.  Compliant on aspirin  81 mg daily and Crestor   10 mg daily without side effects.  Blood pressure today 129/65.  No further concerns at this time.   Update 05/18/2021 JM: Returns for 62-month stroke follow-up accompanied by his mother, Stana Ear who provides majority of history  Initially doing well since prior visit with improvement of right-sided weakness but still  having decreased mobility of right hand. Residual speech and language impairment but also improving. He returned back to driving short distance and always accompanied by family member and recently drove a tractor to mow the grass. He completed HH therapy and ambulating with a cane without difficulty. Per mother, PCP has been working on getting him set up with outpatient therapies but has been having difficulty getting set up (unable to view via epic).  Mother reports upon awakening Friday morning, his bedside tray table was knocked over but unsure of how this happened and since then, he doesn't feel like his "normal" self feeling more out of it and some worsening of gait now requiring use of Rollator walker.  Upon further discussion, he apparently was incontinent of urine upon awakening as well as blood in his mouth.  He does report feeling some confusion and increased fatigue upon awakening that morning. Mother then reports initially after hospital discharge, he would have frequent episodes of "staring off" where mother would check his BP and BG which were stable. He would be more fatigued and seeming "out of it" for 1-2 hours after. These occurred frequently the first 2 weeks after discharge but then subsided. Denies any prior hx of seizures and denies any additional witnessed seizures since Friday. Mother reports paternal grandmother had seizures after a stroke but no seizure prior and no other family history of seizures.  Compliant on aspirin  and Crestor  -denies side effects Blood pressure today 106/65  No further concerns at this time   Initial visit 11/18/2020 Dr. Janett Medin: Mr. Justin Mason is a 43 year old Caucasian male seen today for initial office follow-up visit following hospital admission for stroke in January 2022.  Is accompanied by his girlfriend and mother today.  History is obtained from them and review of electronic medical records and I personally reviewed pertinent available imaging films in PACS.   He has past medical history of morbid obesity, hypertension, hyperlipidemia, arthritis and diabetes.  He presented on 07/24/2020 with sudden onset of difficulty speaking that occurred at 10:30 in the morning on day of admission.  He was brought to the hospital and was noted to be aphasic and a code stroke was activated.  He underwent emergent CT angiogram which showed short segment occlusion of the proximal right M1 MCA with decreased caliber and opacification of the corresponding vascular tree.  Occlusion of the terminal left ICA and proximal left M1 segment with decreased caliber and contrast appropriate opacification.  Luminal irregularity of bilateral ACA and PCA vascular territories with moderate stenosis of the right P2/P3 segment.  CT perfusion suggestive of large area of hypoperfusion without core infarct in the left and hence was taken for emergent mechanical thrombectomy but unfortunately at the time of the angiogram it was discovered that the blockage was likely chronic in this thrombectomy could not be performed.  He was also found to be COVID-positive.  He was intubated and on mechanical ventilatory support for a few days in the ICU.  He was started on aspirin  and Plavix  for his MCA occlusion.  Chest x-ray showed patchy airspace disease right greater than left with suggestive multifocal infection from COVID.  Echocardiogram showed ejection fraction of 60 to  65% without definite cardiac source of embolism.  MRI scan showed acute left MCA infarct with occlusion of the left ICA terminus and left M1 segment with diminished opacification of distal left MCA branches.  LDL cholesterol was 36 mg percent and hemoglobin A1c was 6.2.  Patient was started on aspirin  81 mg and Plavix  75 mg daily for 3 months.  He was transferred to inpatient rehab where he stayed till 09/06/2020 and then was discharged home.  He is currently living at home.  He is getting home physical occupational and speech therapy.  His speech is  improving and is able to speak many words and short sentences up to 9 words.  Complaints and is good.  Still has significant right hemiplegia is able to barely bend his right hand fingers and has no usable function in the right hand.  Is able to lift his right leg off the bed but is still not able to bear weight.  Even with physical therapist he can barely stand for a few moments.  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  Patient however can help with transfers.  He is  continent for his bladder and bowel function.  Is tolerating aspirin  Plavix  well without only minor bruising and no bleeding episodes.  His blood pressure is usually well controlled and today it is borderline in office at 148/91.  His had lipid profile checked by his primary care physician last month but I do not have the results.  He is on Crestor  10 mg which is tolerating well.     ROS:   14 system review of systems is positive for those listed in HPI and all other systems negative  PMH:  Past Medical History:  Diagnosis Date   Acid reflux    AKI (acute kidney injury) (HCC)    Arthritis    Cardiac murmur 05/24/2020   Cellulitis 01/03/2015   Cellulitis of right leg    Cellulitis of right lower extremity 01/03/2015   COVID    Depression    Diabetes (HCC)    Pre Diabetes   Essential hypertension    GERD (gastroesophageal reflux disease)    Gout    Gout    High cholesterol    Hyperlipemia    Hypertension    Morbid obesity (HCC) 05/24/2020   OSA (obstructive sleep apnea)    Rotator cuff tear, right 09/08/2011   Seizures (HCC)    Sepsis (HCC) 01/03/2015   Stroke (HCC)    Stroke (HCC)    Type 2 diabetes mellitus with hyperglycemia (HCC)     Social History:  Social History   Socioeconomic History   Marital status: Single    Spouse name: Not on file   Number of children: Not on file   Years of education: Not on file   Highest education level: Not on file  Occupational  History   Not on file  Tobacco Use   Smoking status: Never   Smokeless tobacco: Current    Types: Chew  Vaping Use   Vaping status: Never Used  Substance and Sexual Activity   Alcohol use: Not Currently    Comment: 1x week   Drug use: No   Sexual activity: Not Currently  Other Topics Concern   Not on file  Social History Narrative   Lives with mom and girlfriend   Right Handed   Drinks caffeinated tea   Social Drivers of Corporate investment banker Strain:  Not on file  Food Insecurity: Not on file  Transportation Needs: Not on file  Physical Activity: Not on file  Stress: Not on file  Social Connections: Not on file  Intimate Partner Violence: Not on file    Medications:   Current Outpatient Medications on File Prior to Visit  Medication Sig Dispense Refill   acetaminophen  (TYLENOL ) 325 MG tablet Take 1-2 tablets (325-650 mg total) by mouth every 4 (four) hours as needed for mild pain.     allopurinol  (ZYLOPRIM ) 100 MG tablet Take 1 tablet (100 mg total) by mouth daily. 90 tablet 1   Ascorbic Acid  (VITAMIN C PO) Take 1 tablet by mouth every evening.     aspirin  EC 81 MG tablet Take 81 mg by mouth daily. Swallow whole.     baclofen  (LIORESAL ) 10 MG tablet TAKE 1 TABLET BY MOUTH 3 TIMES DAILY 90 tablet 0   chlorthalidone  (HYGROTON ) 25 MG tablet Take 1 tablet (25 mg total) by mouth daily. 90 tablet 3   Cholecalciferol  (VITAMIN D -3) 25 MCG (1000 UT) CAPS Take 1,000 Units by mouth every evening.     colchicine  0.6 MG tablet take 2 tablets now and then 1 tablet 1 hour later 3 tablet 3   diclofenac  Sodium (VOLTAREN ) 1 % GEL Apply 2 g topically 4 (four) times daily. 350 g 0   hydrALAZINE  (APRESOLINE ) 50 MG tablet Take 1 tablet (50 mg total) by mouth 2 (two) times daily. 180 tablet 3   irbesartan  (AVAPRO ) 300 MG tablet TAKE 1 TABLET BY MOUTH DAILY 90 tablet 3   levETIRAcetam  (KEPPRA ) 750 MG tablet TAKE 1 TABLET BY MOUTH TWICE DAILY 180 tablet 1   metFORMIN  (GLUCOPHAGE -XR) 500 MG  24 hr tablet Take 500 mg by mouth daily.     Multiple Vitamin (MULTIVITAMIN WITH MINERALS) TABS tablet Take 1 tablet by mouth daily. 30 tablet 0   pantoprazole  (PROTONIX ) 40 MG tablet TAKE 1 TABLET BY MOUTH DAILY 90 tablet 0   potassium chloride  (KLOR-CON ) 10 MEQ tablet TAKE 1 TABLET BY MOUTH DAILY 30 tablet 0   rosuvastatin  (CRESTOR ) 10 MG tablet Take 1 tablet (10 mg total) by mouth daily. 90 tablet 3   sertraline  (ZOLOFT ) 25 MG tablet TAKE 1 TABLET BY MOUTH DAILY 90 tablet 2   zinc  gluconate 50 MG tablet Take 50 mg by mouth daily.     colchicine  0.6 MG tablet Take 1 tablet (0.6 mg total) by mouth daily. (Patient not taking: Reported on 11/05/2023) 7 tablet 0   metoprolol  tartrate (LOPRESSOR ) 50 MG tablet Take 1 tablet (50 mg total) by mouth 2 (two) times daily. 180 tablet 3   potassium chloride  (KLOR-CON  M) 10 MEQ tablet Take 2 tablets daily (Patient not taking: Reported on 11/05/2023) 60 tablet 0   No current facility-administered medications on file prior to visit.    Allergies:  No Known Allergies  Physical Exam Today's Vitals   11/05/23 0909  BP: (!) 131/90  Pulse: 65  Weight: (!) 342 lb 6.4 oz (155.3 kg)  Height: 6\' 1"  (1.854 m)   Body mass index is 45.17 kg/m.   General: Obese very pleasant middle-age Caucasian male seated, in no evident distress Head: head normocephalic and atraumatic.  Neck: supple with no carotid or supraclavicular bruits Cardiovascular: regular rate and rhythm, no murmurs Musculoskeletal: no deformity Skin:  no rash/petichiae Vascular:  Normal pulses all extremities  Neurologic Exam Mental Status: Awake and fully alert. Oriented to place and time. Recent and remote memory intact. Attention  span, concentration and fund of knowledge appropriate during visit.  Mild to moderate expressive aphasia with paraphasic errors but able to name objects without difficulty and follow simple step commands.  Mild dysarthria. Mood and affect appropriate.  Cranial Nerves:  Pupils equal, briskly reactive to light. Extraocular movements full without nystagmus. Visual fields full to confrontation. Hearing intact. Facial sensation intact.  Face, tongue, palate moves normally and symmetrically.  Motor: Normal bulk and tone. Normal strength on the left side.   RUE: 4/5 with increased tone in right hand with contracture of thumb and pinky, middle finger PIP hyperextension, fixed extension of pointer finger RLE: 5/5 except 4+/5 ADF  Sensory.: intact to touch ,pinprick .position and vibratory sensation.  Coordination: Rapid alternating movements impaired right hand.  Finger-to-nose mild incoordination RUE and heel-to-shin performed accurately bilaterally gait and Station: Stands from seated position without difficulty.  Stance is normal.  Gait demonstrates hemiplegic gait with use of strong arm support cane Reflexes: 1+ and asymmetric and brisker on the right. Toes downgoing.         ASSESSMENT: 43 year old Caucasian male with left middle cerebral artery infarction and January 2022 due to left middle cerebral artery occlusion s/p attempted unsuccessful mechanical thrombectomy with also diffuse intracranial multi vessel atherosclerosis with significant residual aphasia and right hemiplegia. Concern of seizure activity initially 05/2021. Vascular risk factors of obesity, diabetes, hypertension and hyperlipidemia.      PLAN:  1. 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 Alessandra Ancona for further possible treatment options for persistent hand contracture -Continue aspirin  81 mg daily and Crestor  10 mg daily for secondary stroke prevention managed/prescribed by PCP -Close PCP follow-up to maintain strict control of hypertension with blood pressure goal below 130/90, diabetes with hemoglobin A1c goal below 7% and lipids with LDL  cholesterol goal below 70 mg/dL.  - Stroke labs 09/2023: A1c 6.4, LDL 37   2. Seizure as late effect of cerebrovascular accident (CVA) (HCC) -Continue Keppra  750 mg twice daily -refill provided -Routine labs per PCP -Prior seizure activity 05/2021 (initiated Keppra ) and 08/2021 (dosage increased) -Discussed consideration of decreasing dosage per mother request which is reasonable seeing how it has been over 2 years since seizure activity but did advise that if recurrent seizure should occur, he would have to go back to no driving for 6 months per [place] law.  As he prefers to continue to drive and is currently tolerating Keppra  dosage well, he prefers to continue on current dosage -Advised to call with any additional seizures or seizure-like activity     Follow-up in 6 months or call earlier if needed    CC:  Francenia Ingle, NP   I spent 32 minutes of face-to-face and non-face-to-face time with patient and mother.  This included previsit chart review, lab review, study review, order entry, electronic health record documentation, patient and mother education and discussion regarding above diagnoses and treatment plan and answered all the questions to patient and mother satisfaction  Johny Nap, [facility]  [facility] [address] [place], [place] [address]  Phone [phone] Fax [phone] Note: This document was prepared with digital dictation and possible smart phrase technology. Any transcriptional errors that result from this process are unintentional.

## 2023-11-05 ENCOUNTER — Ambulatory Visit: Payer: Medicare (Managed Care) | Admitting: Adult Health

## 2023-11-05 ENCOUNTER — Encounter: Payer: Self-pay | Admitting: Adult Health

## 2023-11-05 VITALS — BP 131/90 | HR 65 | Ht 73.0 in | Wt 342.4 lb

## 2023-11-05 DIAGNOSIS — I69398 Other sequelae of cerebral infarction: Secondary | ICD-10-CM | POA: Diagnosis not present

## 2023-11-05 DIAGNOSIS — R569 Unspecified convulsions: Secondary | ICD-10-CM

## 2023-11-05 DIAGNOSIS — I69351 Hemiplegia and hemiparesis following cerebral infarction affecting right dominant side: Secondary | ICD-10-CM | POA: Diagnosis not present

## 2023-11-05 DIAGNOSIS — I69320 Aphasia following cerebral infarction: Secondary | ICD-10-CM | POA: Diagnosis not present

## 2023-11-05 DIAGNOSIS — I63312 Cerebral infarction due to thrombosis of left middle cerebral artery: Secondary | ICD-10-CM | POA: Diagnosis not present

## 2023-11-05 MED ORDER — LEVETIRACETAM 750 MG PO TABS: 750.0000 mg | ORAL_TABLET | Freq: Two times a day (BID) | ORAL | 3 refills | Status: AC

## 2023-11-05 NOTE — Patient Instructions (Addendum)
 Your Plan:  Continue Keppra  750 mg twice daily for seizure prevention  Please call with any recurrent seizure activity  Continue aspirin  and Crestor  and ensure close follow up with your PCP for continued stroke risk factor management   Would recommend follow up with physical medicine and rehab Dr. Alessandra Ancona to discuss possible treatment options for continued right hand difficulty - office number is [phone]  Would highly recommend stopping use of chewing tobacco as this does increase your risk of further strokes and heart disease as well as increase risk for several cancers      Follow up in 1 year or call earlier if needed     Thank you for coming to see us  at [facility]. I hope we have been able to provide you high quality care today.  You may receive a patient satisfaction survey over the next few weeks. We would appreciate your feedback and comments so that we may continue to improve ourselves and the health of our patients.

## 2023-11-08 ENCOUNTER — Telehealth: Payer: Self-pay | Admitting: Family Medicine

## 2023-11-08 NOTE — Telephone Encounter (Signed)
 Noted faxed resent

## 2023-11-08 NOTE — Telephone Encounter (Signed)
 Copied from CRM [id number]. Topic: General - Other >> Nov 08, 2023 12:17 PM Allyne Areola wrote: Reason for CRM: Stana Ear, patient's mother is calling because she was informed that the complete form for his long term care was not received completely. They only received the front part of the document and missing the rest. Please resend.

## 2023-11-09 ENCOUNTER — Other Ambulatory Visit: Payer: Self-pay | Admitting: Family Medicine

## 2023-11-16 ENCOUNTER — Other Ambulatory Visit: Payer: Self-pay | Admitting: Family Medicine

## 2023-11-16 DIAGNOSIS — K219 Gastro-esophageal reflux disease without esophagitis: Secondary | ICD-10-CM

## 2023-12-02 ENCOUNTER — Telehealth: Payer: Self-pay | Admitting: Family Medicine

## 2023-12-02 NOTE — Telephone Encounter (Signed)
Noted resent

## 2023-12-02 NOTE — Telephone Encounter (Signed)
 Copied from CRM [id number]. Topic: General - Other >> Dec 02, 2023  3:22 PM Chuck Crater wrote: Reason for CRM: Patient mom stated that they had pcp to fill out form disability insurance and she stated that they are only getting the patient part and not the physician part of it. Insurance is requesting the physician part of the form. Fax: [phone] Justin Mason is who will get the paperwork Number:[phone]

## 2023-12-07 ENCOUNTER — Other Ambulatory Visit: Payer: Self-pay | Admitting: Cardiology

## 2023-12-09 ENCOUNTER — Ambulatory Visit: Admitting: Adult Health

## 2024-02-08 ENCOUNTER — Other Ambulatory Visit: Payer: Self-pay | Admitting: Family Medicine

## 2024-02-08 ENCOUNTER — Encounter: Payer: Self-pay | Admitting: Family Medicine

## 2024-02-08 ENCOUNTER — Ambulatory Visit (INDEPENDENT_AMBULATORY_CARE_PROVIDER_SITE_OTHER): Payer: Medicare (Managed Care) | Admitting: Family Medicine

## 2024-02-08 VITALS — BP 153/91

## 2024-02-08 DIAGNOSIS — Z0000 Encounter for general adult medical examination without abnormal findings: Secondary | ICD-10-CM

## 2024-02-08 DIAGNOSIS — K219 Gastro-esophageal reflux disease without esophagitis: Secondary | ICD-10-CM

## 2024-02-08 NOTE — Patient Instructions (Signed)
 I really enjoyed getting to talk with you today! I am available on Tuesdays and Thursdays for virtual visits if you have any questions or concerns, or if I can be of any further assistance.   CHECKLIST FROM ANNUAL WELLNESS VISIT:  -Follow up (please call to schedule if not scheduled after visit):   -yearly for annual wellness visit with primary care office  Here is a list of your preventive care/health maintenance measures and the plan for each if any are due:  PLAN For any measures below that may be due:    1. Schedule diabetic eye exam with eye doctor   2. Can get the vaccines at the pharmacy, please bring record if/when you do so that we can update your chart.    3. Please request diabetic kidney evaluation at your next office visit with Philippe.   Health Maintenance  Topic Date Due   Medicare Annual Wellness (AWV)  Never done   COVID-19 Vaccine (1) Never done   Diabetic kidney evaluation - Urine ACR  Never done   Pneumococcal Vaccine: 19-49 Years (1 of 2 - PCV) Never done   Hepatitis B Vaccines (1 of 3 - 19+ 3-dose series) Never done   HPV VACCINES (1 - 3-dose SCDM series) Never done   OPHTHALMOLOGY EXAM  11/18/2023   INFLUENZA VACCINE  02/04/2024   HEMOGLOBIN A1C  04/04/2024   FOOT EXAM  10/03/2024   Diabetic kidney evaluation - eGFR measurement  11/03/2024   DTaP/Tdap/Td (7 - Tdap) 05/05/2028   Hepatitis C Screening  Completed   HIV Screening  Completed   Meningococcal B Vaccine  Aged Out    -See a dentist at least yearly  -Get your eyes checked and then per your eye specialist's recommendations  -Other issues addressed today:   1. Recheck blood pressure 1-2 hours after taking your medications. Goal in 120/70. If running high please contact Joanna's office immediately for an appointment and recommendations. Take all medications as instructed daily.    2. Please cut back on alcohol to no more than 2 drinks in any given day. Please schedule appointment if you have any  difficulty cutting back.    3. Please complete advanced directives - see information provided below for further details.    -I have included below further information regarding a healthy whole foods based diet, physical activity guidelines for adults, stress management and opportunities for social connections. I hope you find this information useful.   -----------------------------------------------------------------------------------------------------------------------------------------------------------------------------------------------------------------------------------------------------------    NUTRITION: -eat real food: lots of colorful vegetables (half the plate) and fruits -5-7 servings of vegetables and fruits per day (fresh or steamed is best), exp. 2 servings of vegetables with lunch and dinner and 2 servings of fruit per day. Berries and greens such as kale and collards are great choices.  -consume on a regular basis:  fresh fruits, fresh veggies, fish, nuts, seeds, healthy oils (such as olive oil, avocado oil), whole grains (make sure for bread/pasta/crackers/etc., that the first ingredient on label contains the word whole), legumes. -can eat small amounts of dairy and lean meat (no larger than the palm of your hand), but avoid processed meats such as ham, bacon, lunch meat, etc. -drink water -try to avoid fast food and pre-packaged foods, processed meat, ultra processed foods/beverages (donuts, candy, etc.) -most experts advise limiting sodium to < 2300mg  per day, should limit further is any chronic conditions such as high blood pressure, heart disease, diabetes, etc. The American Heart Association advised that < 1500mg  is  is ideal -try to avoid foods/beverages that contain any ingredients with names you do not recognize  -try to avoid foods/beverages  with added sugar or sweeteners/sweets  -try to avoid sweet drinks (including diet drinks): soda, juice, Gatorade, sweet tea,  power drinks, diet drinks -try to avoid white rice, white bread, pasta (unless whole grain)  EXERCISE GUIDELINES FOR ADULTS: -if you wish to increase your physical activity, do so gradually and with the approval of your doctor -STOP and seek medical care immediately if you have any chest pain, chest discomfort or trouble breathing when starting or increasing exercise  -move and stretch your body, legs, feet and arms when sitting for long periods -Physical activity guidelines for optimal health in adults: -get at least 150 minutes per week of moderate exercise (can talk, but not sing); this is about 20-30 minutes of sustained activity 5-7 days per week or two 10-15 minute episodes of sustained activity 5-7 days per week -do some muscle building/resistance training/strength training at least 2 days per week  -balance exercises 3+ days per week:   Stand somewhere where you have something sturdy to hold onto if you lose balance    1) lift up on toes, then back down, start with 5x per day and work up to 20x   2) stand and lift one leg straight out to the side so that foot is a few inches of the floor, start with 5x each side and work up to 20x each side   3) stand on one foot, start with 5 seconds each side and work up to 20 seconds on each side  If you need ideas or help with getting more active:  -Silver sneakers https://tools.silversneakers.com  -Walk with a Doc: http://www.duncan-[email or web address]  -try to include resistance (weight lifting/strength building) and balance exercises twice per week: or the following link for ideas: http://castillo-powell.com/  BuyDucts.dk  STRESS MANAGEMENT: -can try meditating, or just sitting quietly with deep breathing while intentionally relaxing all parts of your body for 5 minutes daily -if you need further help with stress, anxiety or depression please follow up with your  primary doctor or contact the wonderful folks at [facility]: [phone]  SOCIAL CONNECTIONS: -options in [place] if you wish to engage in more social and exercise related activities:  -Silver sneakers https://tools.silversneakers.com  -Walk with a Doc: [email or web address]-[email or web address]  -Check out the [facility] Active Adults 50+ section on the [place] [employer] (hiking clubs, book clubs, cards and games, chess, exercise classes, aquatic classes and much more) - see the website for details: https://www.[place].gov/departments/parks-recreation/active-adults50  -YouTube has lots of exercise videos for different ages and abilities as well  -[facility] (a variety of indoor and outdoor inperson activities for adults). [phone]. [address]  -Virtual Online Classes (a variety of topics): see seniorplanet.org or call [phone]  -consider volunteering at a school, hospice center, church, senior center or elsewhere   ADVANCED HEALTHCARE DIRECTIVES:  [place] Advanced Directives assistance:   ExpressWeek.com.cy  Everyone should have advanced health care directives in place. This is so that you get the care you want, should you ever be in a situation where you are unable to make your own medical decisions.   From the [place] Advanced Directive Website: Advance Health Care Directives are legal documents in which you give written instructions about your health care if, in the future, you cannot speak for yourself.   A health care power of attorney allows you to name a person you trust to make  your health care decisions if you cannot make them yourself. A declaration of a desire for a natural death (or living will) is document, which states that you desire not to have your life prolonged by extraordinary measures if you have a terminal or incurable illness or if you are in a vegetative  state. An advance instruction for mental health treatment makes a declaration of instructions, information and preferences regarding your mental health treatment. It also states that you are aware that the advance instruction authorizes a mental health treatment provider to act according to your wishes. It may also outline your consent or refusal of mental health treatment. A declaration of an anatomical gift allows anyone over the age of 58 to make a gift by will, organ donor card or other document.   Please see the following website or an elder law attorney for forms, FAQs and for completion of advanced directives: [place]  Print production planner Health Care Directives Advance Health Care Directives ([email or web address]/)  Or copy and paste the following to your web browser: [email or web address]

## 2024-02-08 NOTE — Progress Notes (Signed)
 PATIENT CHECK-IN and HEALTH RISK ASSESSMENT QUESTIONNAIRE:  -completed by phone/video for upcoming Medicare Preventive Visit   Pre-Visit Check-in: 1)Vitals (height, wt, BP, etc) - record in vitals section for visit on day of visit Request home vitals (wt, BP, etc.) and enter into vitals, THEN update Vital Signs SmartPhrase below at the top of the HPI. See below.  2)Review and Update Medications, Allergies PMH, Surgeries, Social history in Epic 3)Hospitalizations in the last year with date/reason? NO   4)Review and Update Care Team (patient's specialists) in Epic 5) Complete PHQ9 in Epic  6) Complete Fall Screening in Epic 7)Review all Health Maintenance Due and order if not done.  Medicare Wellness Patient Questionnaire:  Answer theses question about your habits: How often do you have a drink containing alcohol?2 times a week  How many drinks containing alcohol do you have on a typical day when you are drinking?4-6 drinks  How often do you have six or more drinks on one occasion?2-3 times week  Have you ever smoked?No  Quit date if applicable? Na   How many packs a day do/did you smoke? No  Do you use smokeless tobacco?Yes  Do you use an illicit drugs?No  On average, how many days per week do you engage in moderate to strenuous exercise (like a brisk walk)?NO  On average, how many minutes do you engage in exercise at this level?Na  Are you sexually active? No Number of partners?Na  Typical breakfast: Toast, meat, egg, fruit  Typical lunch:Sandwich, or leftovers  Typical dinner:fast food  Typical snacks: popcorn   Beverages:  sweet tea, water, Gatorade   Answer theses question about your everyday activities: Can you perform most household chores?No, patient had a stroke, patient doesn't have use of right hand  Are you deaf or have significant trouble hearing?no  Do you feel that you have a problem with memory? Yes  Do you feel safe at home?yes  Last dentist visit?4 years ago   8. Do you have any difficulty performing your everyday activities?yes, stroke  Are you having any difficulty walking, taking medications on your own, and or difficulty managing daily home needs?yes, walks with a cane or walker, patient has mother helping with medication,  Do you have difficulty walking or climbing stairs?yes Do you have difficulty dressing or bathing?yes  Do you have difficulty doing errands alone such as visiting a doctor's office or shopping?yes Do you currently have any difficulty preparing food and eating?yes  Do you currently have any difficulty using the toilet?yes  Do you have any difficulty managing your finances?yes  Do you have any difficulties with housekeeping of managing your housekeeping?yes    Do you have Advanced Directives in place (Living Will, Healthcare Power or Attorney)? No    Last eye Exam and location? Did over a year ago at the office   Do you currently use prescribed or non-prescribed narcotic or opioid pain medications?NO   Do you have a history or close family history of breast, ovarian, tubal or peritoneal cancer or a family member with BRCA (breast cancer susceptibility 1 and 2) gene mutations?No    Nurse/Assistant Credentials/time stamp: Rea RONAL Silvan  CMA 11:55 am    ----------------------------------------------------------------------------------------------------------------------------------------------------------------------------------------------------------------------  Because this visit was a virtual/telehealth visit, some criteria may be missing or patient reported. Any vitals not documented were not able to be obtained and vitals that have been documented are patient reported.    MEDICARE ANNUAL PREVENTIVE CARE VISIT WITH PROVIDER (Welcome to Medicare, initial annual wellness  or annual wellness exam)  Virtual Visit via  Video Note  I connected with Justin Mason on 02/08/24  by certified telemedicine application and  verified that I am speaking with the correct person using two identifiers.  Location patient: home Location provider:work or home office Persons participating in the virtual visit: patient, provider  Concerns and/or follow up today: stable, can walk with a cane, R side is weak, can walk with a cane   See HM section in Epic for other details of completed HM.    ROS: negative for report of fevers, unintentional weight loss, vision changes, vision loss, hearing loss or change, chest pain, sob, hemoptysis, melena, hematochezia, hematuria, falls, bleeding or bruising, thoughts of suicide or self harm, memory loss  Patient-completed extensive health risk assessment - reviewed and discussed with the patient: See Health Risk Assessment completed with patient prior to the visit either above or in recent phone note. This was reviewed in detailed with the patient today and appropriate recommendations, orders and referrals were placed as needed per Summary below and patient instructions.   Review of Medical History: -PMH, PSH, Family History and current specialty and care providers reviewed and updated and listed below   Patient Care Team: Billy Philippe SAUNDERS, NP as PCP - General (Family Medicine) [place], Peter M, MD as PCP - Cardiology (Cardiology) Billy Philippe SAUNDERS, NP as Nurse Practitioner (Family Medicine) Whitfield Raisin, NP as Nurse Practitioner (Neurology) [facility]   Past Medical History:  Diagnosis Date   Acid reflux    [facility] ([facility])    Arthritis    Cardiac murmur 05/24/2020   Cellulitis 01/03/2015   Cellulitis of right leg    Cellulitis of right lower extremity 01/03/2015   COVID    Depression    Diabetes (HCC)    Pre Diabetes   Essential hypertension    GERD (gastroesophageal reflux disease)    Gout    Gout    High cholesterol    Hyperlipemia    Hypertension    Morbid obesity (HCC) 05/24/2020   OSA (obstructive sleep  apnea)    Rotator cuff tear, right 09/08/2011   Seizures (HCC)    Sepsis (HCC) 01/03/2015   Stroke (HCC)    Stroke (HCC)    Type 2 diabetes mellitus with hyperglycemia (HCC)     Past Surgical History:  Procedure Laterality Date   blood clot left side brain     Frontal   INCISION AND DRAINAGE FOOT  1991-1992   STEPPED ON NAIL / INFECTED WOUND   IR ANGIO INTRA EXTRACRAN SEL COM CAROTID INNOMINATE BILAT MOD SED  07/24/2020   IR ANGIO VERTEBRAL SEL SUBCLAVIAN INNOMINATE UNI L MOD SED  07/24/2020   IR ANGIO VERTEBRAL SEL VERTEBRAL UNI R MOD SED  07/24/2020   left foot surgery     age 76   RADIOLOGY WITH ANESTHESIA N/A 07/24/2020   Procedure: IR WITH ANESTHESIA - CODE STROKE;  Surgeon: Radiologist, Medication, MD;  Location: [facility] OR;  Service: Radiology;  Laterality: N/A;   SHOULDER SURGERY     September 08, 2011    Social History   Socioeconomic History   Marital status: Single    Spouse name: Not on file   Number of children: Not on file   Years of education: Not on file   Highest education level: Not on file  Occupational History   Not on file  Tobacco Use   Smoking status: Never   Smokeless tobacco:  Current    Types: Chew  Vaping Use   Vaping status: Never Used  Substance and Sexual Activity   Alcohol use: Not Currently    Comment: 1x week   Drug use: No   Sexual activity: Not Currently  Other Topics Concern   Not on file  Social History Narrative   Lives with mom and girlfriend   Right Handed   Drinks caffeinated tea   Social Drivers of Corporate investment banker Strain: Not on file  Food Insecurity: Not on file  Transportation Needs: Not on file  Physical Activity: Not on file  Stress: Not on file  Social Connections: Not on file  Intimate Partner Violence: Not on file    Family History  Problem Relation Age of Onset   Hyperlipidemia Mother    Vision loss Mother    Asthma Mother    Arthritis Mother    Heart disease Mother    COPD Mother    Heart  attack Mother    Gout Father    Anesthesia problems Sister    Colon cancer Maternal Grandfather    Stroke Maternal Grandfather    Prostate cancer Other    Colon cancer Other     Current Outpatient Medications on File Prior to Visit  Medication Sig Dispense Refill   acetaminophen  (TYLENOL ) 325 MG tablet Take 1-2 tablets (325-650 mg total) by mouth every 4 (four) hours as needed for mild pain.     allopurinol  (ZYLOPRIM ) 100 MG tablet Take 1 tablet (100 mg total) by mouth daily. 90 tablet 1   Ascorbic Acid  (VITAMIN C PO) Take 1 tablet by mouth every evening.     aspirin  EC 81 MG tablet Take 81 mg by mouth daily. Swallow whole.     baclofen  (LIORESAL ) 10 MG tablet TAKE 1 TABLET BY MOUTH 3 TIMES DAILY 90 tablet 0   chlorthalidone  (HYGROTON ) 25 MG tablet Take 1 tablet (25 mg total) by mouth daily. 90 tablet 3   Cholecalciferol  (VITAMIN D -3) 25 MCG (1000 UT) CAPS Take 1,000 Units by mouth every evening.     colchicine  0.6 MG tablet Take 1 tablet (0.6 mg total) by mouth daily. 7 tablet 0   colchicine  0.6 MG tablet take 2 tablets now and then 1 tablet 1 hour later 3 tablet 3   diclofenac  Sodium (VOLTAREN ) 1 % GEL Apply 2 g topically 4 (four) times daily. 350 g 0   hydrALAZINE  (APRESOLINE ) 50 MG tablet Take 1 tablet (50 mg total) by mouth 2 (two) times daily. 180 tablet 3   irbesartan  (AVAPRO ) 300 MG tablet TAKE 1 TABLET BY MOUTH DAILY 90 tablet 2   levETIRAcetam  (KEPPRA ) 750 MG tablet Take 1 tablet (750 mg total) by mouth 2 (two) times daily. 180 tablet 3   metFORMIN  (GLUCOPHAGE -XR) 500 MG 24 hr tablet Take 500 mg by mouth daily.     metoprolol  tartrate (LOPRESSOR ) 50 MG tablet Take 1 tablet (50 mg total) by mouth 2 (two) times daily. 180 tablet 3   Multiple Vitamin (MULTIVITAMIN WITH MINERALS) TABS tablet Take 1 tablet by mouth daily. 30 tablet 0   pantoprazole  (PROTONIX ) 40 MG tablet TAKE 1 TABLET BY MOUTH DAILY 90 tablet 0   potassium chloride  (KLOR-CON  M) 10 MEQ tablet Take 2 tablets daily 60  tablet 0   potassium chloride  (KLOR-CON ) 10 MEQ tablet TAKE 1 TABLET BY MOUTH DAILY 30 tablet 0   rosuvastatin  (CRESTOR ) 10 MG tablet Take 1 tablet (10 mg total) by mouth daily. 90  tablet 3   sertraline  (ZOLOFT ) 25 MG tablet TAKE 1 TABLET BY MOUTH DAILY 90 tablet 2   zinc  gluconate 50 MG tablet Take 50 mg by mouth daily.     No current facility-administered medications on file prior to visit.    No Known Allergies     Physical Exam Vitals requested from patient and listed below if patient had equipment and was able to obtain at home for this virtual visit: Vitals:   02/08/24 1146  BP: (!) 153/91   Estimated body mass index is 45.17 kg/m as calculated from the following:   Height as of 11/05/23: 6' 1 (1.854 m).   Weight as of 11/05/23: 342 lb 6.4 oz (155.3 kg).  EKG (optional): deferred due to virtual visit  GENERAL: alert, oriented, no acute distress detected; full vision exam deferred due to pandemic and/or virtual encounter  HEENT: atraumatic, conjunttiva clear, no obvious abnormalities on inspection of external nose and ears  NECK: normal movements of the head and neck  LUNGS: on inspection no signs of respiratory distress, breathing rate appears normal, no obvious gross SOB, gasping or wheezing  CV: no obvious cyanosis  MS: moves all visible extremities without noticeable abnormality  PSYCH/NEURO: pleasant and cooperative, no obvious depression or anxiety, speech and thought processing grossly intact, Cognitive function grossly intact  Flowsheet Row Office Visit from 10/04/2023 in [facility] [place]  PHQ-9 Total Score 1        10/04/2023   10:57 AM 02/02/2023    2:56 PM 09/30/2022    2:58 PM 05/23/2021    2:04 PM 02/14/2021    1:53 PM  Depression screen PHQ 2/9  Decreased Interest 0 0 0 0 0  Down, Depressed, Hopeless 0 0 0 0 0  PHQ - 2 Score 0 0 0 0 0  Altered sleeping 0 0 0    Tired, decreased energy 0 0 0    Change in appetite 0 0 0     Feeling bad or failure about yourself  0 0 0    Trouble concentrating 0 0 0    Moving slowly or fidgety/restless 1 0 0    Suicidal thoughts 0 0 0    PHQ-9 Score 1 0 0    Difficult doing work/chores Not difficult at all Not difficult at all Not difficult at all         02/14/2021    1:53 PM 05/23/2021    2:04 PM 09/08/2021    4:30 PM 02/02/2023    2:56 PM 10/04/2023   10:57 AM  Fall Risk  Falls in the past year? 0 0  0 1  Was there an injury with Fall? 0 0  0 0  Fall Risk Category Calculator 0 0  0 1  Fall Risk Category (Retired) Low  Low      (RETIRED) Patient Fall Risk Level   Low fall risk     Patient at Risk for Falls Due to    No Fall Risks   Fall risk Follow up    Falls evaluation completed Falls evaluation completed     Data saved with a previous flowsheet row definition     SUMMARY AND PLAN:  Encounter for Medicare annual wellness exam   Discussed applicable health maintenance/preventive health measures and advised and referred or ordered per patient preferences: -discussed eye exam and they agree to schedule -discussed vaccines due and they agree to schedule with Philippe or at pharmacy -advised diabetic kidney exam  at next office visit -discussed elevated BP, goals; mom says was up because had not taken meds and just took med. She says she will recheck in 1-2 hours. Advised to notify Philippe if elevated and to take meds as prescribed.  Health Maintenance  Topic Date Due   COVID-19 Vaccine (1) Never done   Diabetic kidney evaluation - Urine ACR  Never done   Pneumococcal Vaccine: 19-49 Years (1 of 2 - PCV) Never done   Hepatitis B Vaccines (1 of 3 - 19+ 3-dose series) Never done   HPV VACCINES (1 - 3-dose SCDM series) Never done   OPHTHALMOLOGY EXAM  11/18/2023   INFLUENZA VACCINE  02/04/2024   HEMOGLOBIN A1C  04/04/2024   FOOT EXAM  10/03/2024   Diabetic kidney evaluation - eGFR measurement  11/03/2024   Medicare Annual Wellness (AWV)  02/07/2025   DTaP/Tdap/Td  (7 - Tdap) 05/05/2028   Hepatitis C Screening  Completed   HIV Screening  Completed   Meningococcal B Vaccine  Aged Out     Education and counseling on the following was provided based on the above review of health and a plan/checklist for the patient, along with additional information discussed, was provided for the patient in the patient instructions :  -Advised on importance of completing advanced directives, discussed options for completing and provided information in patient instructions as well -Advised and counseled on a healthy lifestyle - including the importance of a healthy diet, regular physical activity -Reviewed patient's current diet. Advised and counseled on a whole foods based healthy diet. Encouraged to reduce sweetened beverages/foods and ultra processed foods, A summary of a healthy diet was provided in the Patient Instructions.  -reviewed patient's current physical activity level and discussed exercise guidelines for adults. Mom [place] She is considering getting him more PT and advised to reach out to [facility] about this. Advised Discussed community resources and ideas for safe exercise at home to assist in meeting exercise guideline recommendations in a safe and healthy way.  -Advise yearly dental visits at minimum and regular eye exams -Advised and counseled on alcohol safe limits, risks; offered help, counseled, advised to cut back to no more than 2 alcoholic beverages in any given day and to schedule appt if any difficulty cutting back  Follow up: see patient instructions   Patient Instructions  I really enjoyed getting to talk with you today! I am available on Tuesdays and Thursdays for virtual visits if you have any questions or concerns, or if I can be of any further assistance.   CHECKLIST FROM ANNUAL WELLNESS VISIT:  -Follow up (please call to schedule if not scheduled after visit):   -yearly for annual wellness visit with primary care  office  Here is a list of your preventive care/health maintenance measures and the plan for each if any are due:  PLAN For any measures below that may be due:    1. Schedule diabetic eye exam with eye doctor   2. Can get the vaccines at the pharmacy, please bring record if/when you do so that we can update your chart.    3. Please request diabetic kidney evaluation at your next office visit with Philippe.   Health Maintenance  Topic Date Due   Medicare Annual Wellness (AWV)  Never done   COVID-19 Vaccine (1) Never done   Diabetic kidney evaluation - Urine ACR  Never done   Pneumococcal Vaccine: 19-49 Years (1 of 2 - PCV) Never done  Hepatitis B Vaccines (1 of 3 - 19+ 3-dose series) Never done   HPV VACCINES (1 - 3-dose SCDM series) Never done   OPHTHALMOLOGY EXAM  11/18/2023   INFLUENZA VACCINE  02/04/2024   HEMOGLOBIN A1C  04/04/2024   FOOT EXAM  10/03/2024   Diabetic kidney evaluation - eGFR measurement  11/03/2024   DTaP/Tdap/Td (7 - Tdap) 05/05/2028   Hepatitis C Screening  Completed   HIV Screening  Completed   Meningococcal B Vaccine  Aged Out    -See a dentist at least yearly  -Get your eyes checked and then per your eye specialist's recommendations  -Other issues addressed today:   1. Recheck blood pressure 1-2 hours after taking your medications. Goal in 120/70. If running high please contact Joanna's office immediately for an appointment and recommendations. Take all medications as instructed daily.    2. Please cut back on alcohol to no more than 2 drinks in any given day. Please schedule appointment if you have any difficulty cutting back.    3. Please complete advanced directives - see information provided below for further details.    -I have included below further information regarding a healthy whole foods based diet, physical activity guidelines for adults, stress management and opportunities for social connections. I hope you find this information useful.    -----------------------------------------------------------------------------------------------------------------------------------------------------------------------------------------------------------------------------------------------------------    NUTRITION: -eat real food: lots of colorful vegetables (half the plate) and fruits -5-7 servings of vegetables and fruits per day (fresh or steamed is best), exp. 2 servings of vegetables with lunch and dinner and 2 servings of fruit per day. Berries and greens such as kale and collards are great choices.  -consume on a regular basis:  fresh fruits, fresh veggies, fish, nuts, seeds, healthy oils (such as olive oil, avocado oil), whole grains (make sure for bread/pasta/crackers/etc., that the first ingredient on label contains the word whole), legumes. -can eat small amounts of dairy and lean meat (no larger than the palm of your hand), but avoid processed meats such as ham, bacon, lunch meat, etc. -drink water -try to avoid fast food and pre-packaged foods, processed meat, ultra processed foods/beverages (donuts, candy, etc.) -most experts advise limiting sodium to < 2300mg  per day, should limit further is any chronic conditions such as high blood pressure, heart disease, diabetes, etc. The American Heart Association advised that < 1500mg  is is ideal -try to avoid foods/beverages that contain any ingredients with names you do not recognize  -try to avoid foods/beverages  with added sugar or sweeteners/sweets  -try to avoid sweet drinks (including diet drinks): soda, juice, Gatorade, sweet tea, power drinks, diet drinks -try to avoid white rice, white bread, pasta (unless whole grain)  EXERCISE GUIDELINES FOR ADULTS: -if you wish to increase your physical activity, do so gradually and with the approval of your doctor -STOP and seek medical care immediately if you have any chest pain, chest discomfort or trouble breathing when starting or  increasing exercise  -move and stretch your body, legs, feet and arms when sitting for long periods -Physical activity guidelines for optimal health in adults: -get at least 150 minutes per week of moderate exercise (can talk, but not sing); this is about 20-30 minutes of sustained activity 5-7 days per week or two 10-15 minute episodes of sustained activity 5-7 days per week -do some muscle building/resistance training/strength training at least 2 days per week  -balance exercises 3+ days per week:   Stand somewhere where you have something sturdy to hold onto  if you lose balance    1) lift up on toes, then back down, start with 5x per day and work up to 20x   2) stand and lift one leg straight out to the side so that foot is a few inches of the floor, start with 5x each side and work up to 20x each side   3) stand on one foot, start with 5 seconds each side and work up to 20 seconds on each side  If you need ideas or help with getting more active:  -Silver sneakers https://tools.silversneakers.com  -Walk with a Doc: http://www.duncan-williams.com/  -try to include resistance (weight lifting/strength building) and balance exercises twice per week: or the following link for ideas: http://castillo-powell.com/  BuyDucts.dk  STRESS MANAGEMENT: -can try meditating, or just sitting quietly with deep breathing while intentionally relaxing all parts of your body for 5 minutes daily -if you need further help with stress, anxiety or depression please follow up with your primary doctor or contact the wonderful folks at [facility]: [phone]  SOCIAL CONNECTIONS: -options in [place] if you wish to engage in more social and exercise related activities:  -Silver sneakers https://tools.silversneakers.com  -Walk with a Doc: http://www.duncan-williams.com/  -Check out the [facility] Active Adults 50+  section on the [place] of [employer] (hiking clubs, book clubs, cards and games, chess, exercise classes, aquatic classes and much more) - see the website for details: https://www.[place].gov/departments/parks-recreation/active-adults50  -YouTube has lots of exercise videos for different ages and abilities as well  -[facility] (a variety of indoor and outdoor inperson activities for adults). [phone]. [address]  -Virtual Online Classes (a variety of topics): see seniorplanet.org or call [phone]  -consider volunteering at a school, hospice center, church, senior center or elsewhere   ADVANCED HEALTHCARE DIRECTIVES:  [place] Advanced Directives assistance:   ExpressWeek.com.cy  Everyone should have advanced health care directives in place. This is so that you get the care you want, should you ever be in a situation where you are unable to make your own medical decisions.   From the [place] Advanced Directive Website: Advance Health Care Directives are legal documents in which you give written instructions about your health care if, in the future, you cannot speak for yourself.   A health care power of attorney allows you to name a person you trust to make your health care decisions if you cannot make them yourself. A declaration of a desire for a natural death (or living will) is document, which states that you desire not to have your life prolonged by extraordinary measures if you have a terminal or incurable illness or if you are in a vegetative state. An advance instruction for mental health treatment makes a declaration of instructions, information and preferences regarding your mental health treatment. It also states that you are aware that the advance instruction authorizes a mental health treatment provider to act according to your wishes. It may also outline your consent or refusal of mental  health treatment. A declaration of an anatomical gift allows anyone over the age of 22 to make a gift by will, organ donor card or other document.   Please see the following website or an elder law attorney for forms, FAQs and for completion of advanced directives: Loss adjuster, chartered Health Care Directives Advance Health Care Directives (http://guzman.com/)  Or copy and paste the following to your web browser: PoshChat.[email or web address]           Chiquita JONELLE Cramp,  DO

## 2024-03-07 ENCOUNTER — Other Ambulatory Visit: Payer: Self-pay | Admitting: Family Medicine

## 2024-03-07 DIAGNOSIS — E876 Hypokalemia: Secondary | ICD-10-CM

## 2024-04-05 ENCOUNTER — Encounter: Payer: Medicare (Managed Care) | Admitting: Family Medicine

## 2024-04-06 ENCOUNTER — Other Ambulatory Visit: Payer: Self-pay | Admitting: Family

## 2024-04-06 DIAGNOSIS — M109 Gout, unspecified: Secondary | ICD-10-CM

## 2024-04-20 ENCOUNTER — Encounter: Payer: Self-pay | Admitting: Family Medicine

## 2024-04-20 ENCOUNTER — Ambulatory Visit: Payer: Medicare (Managed Care) | Admitting: Family Medicine

## 2024-04-20 VITALS — BP 132/86 | HR 75 | Temp 98.0°F | Ht 73.0 in | Wt 331.0 lb

## 2024-04-20 DIAGNOSIS — Z0001 Encounter for general adult medical examination with abnormal findings: Secondary | ICD-10-CM

## 2024-04-20 DIAGNOSIS — G8929 Other chronic pain: Secondary | ICD-10-CM

## 2024-04-20 DIAGNOSIS — E7800 Pure hypercholesterolemia, unspecified: Secondary | ICD-10-CM

## 2024-04-20 DIAGNOSIS — E1165 Type 2 diabetes mellitus with hyperglycemia: Secondary | ICD-10-CM

## 2024-04-20 DIAGNOSIS — M25561 Pain in right knee: Secondary | ICD-10-CM | POA: Diagnosis not present

## 2024-04-20 DIAGNOSIS — I10 Essential (primary) hypertension: Secondary | ICD-10-CM | POA: Diagnosis not present

## 2024-04-20 DIAGNOSIS — Z6841 Body Mass Index (BMI) 40.0 and over, adult: Secondary | ICD-10-CM

## 2024-04-20 DIAGNOSIS — L02212 Cutaneous abscess of back [any part, except buttock]: Secondary | ICD-10-CM | POA: Diagnosis not present

## 2024-04-20 DIAGNOSIS — M25562 Pain in left knee: Secondary | ICD-10-CM | POA: Diagnosis not present

## 2024-04-20 DIAGNOSIS — Z0000 Encounter for general adult medical examination without abnormal findings: Secondary | ICD-10-CM

## 2024-04-20 LAB — COMPREHENSIVE METABOLIC PANEL WITH GFR
ALT: 36 U/L (ref 0–53)
AST: 24 U/L (ref 0–37)
Albumin: 4.3 g/dL (ref 3.5–5.2)
Alkaline Phosphatase: 67 U/L (ref 39–117)
BUN: 9 mg/dL (ref 6–23)
CO2: 32 meq/L (ref 19–32)
Calcium: 9.4 mg/dL (ref 8.4–10.5)
Chloride: 95 meq/L — ABNORMAL LOW (ref 96–112)
Creatinine, Ser: 0.92 mg/dL (ref 0.40–1.50)
GFR: 102.2 mL/min (ref >60.00)
Glucose, Bld: 173 mg/dL — ABNORMAL HIGH (ref 70–99)
Potassium: 3.3 meq/L — ABNORMAL LOW (ref 3.5–5.1)
Sodium: 136 meq/L (ref 135–145)
Total Bilirubin: 0.6 mg/dL (ref 0.2–1.2)
Total Protein: 7.3 g/dL (ref 6.0–8.3)

## 2024-04-20 LAB — CBC WITH DIFFERENTIAL/PLATELET
Basophils Absolute: 0 K/uL (ref 0.0–0.1)
Basophils Relative: 0.4 % (ref 0.0–3.0)
Eosinophils Absolute: 0.3 K/uL (ref 0.0–0.7)
Eosinophils Relative: 2.7 % (ref 0.0–5.0)
HCT: 45.8 % (ref 39.0–52.0)
Hemoglobin: 15.8 g/dL (ref 13.0–17.0)
Lymphocytes Relative: 22.1 % (ref 12.0–46.0)
Lymphs Abs: 2.3 K/uL (ref 0.7–4.0)
MCHC: 34.4 g/dL (ref 30.0–36.0)
MCV: 84 fl (ref 78.0–100.0)
Monocytes Absolute: 0.8 K/uL (ref 0.1–1.0)
Monocytes Relative: 7.6 % (ref 3.0–12.0)
Neutro Abs: 6.9 K/uL (ref 1.4–7.7)
Neutrophils Relative %: 67.2 % (ref 43.0–77.0)
Platelets: 170 K/uL (ref 150.0–400.0)
RBC: 5.46 Mil/uL (ref 4.22–5.81)
RDW: 13.5 % (ref 11.5–15.5)
WBC: 10.2 K/uL (ref 4.0–10.5)

## 2024-04-20 LAB — HEMOGLOBIN A1C: Hgb A1c MFr Bld: 7.3 % — ABNORMAL HIGH (ref 4.6–6.5)

## 2024-04-20 LAB — LIPID PANEL
Cholesterol: 92 mg/dL (ref 0–200)
HDL: 31.1 mg/dL — ABNORMAL LOW (ref >39.00)
LDL Cholesterol: 37 mg/dL (ref 0–99)
NonHDL: 60.87
Total CHOL/HDL Ratio: 3
Triglycerides: 120 mg/dL (ref 0.0–149.0)
VLDL: 24 mg/dL (ref 0.0–40.0)

## 2024-04-20 LAB — MICROALBUMIN / CREATININE URINE RATIO
Creatinine,U: 53.7 mg/dL
Microalb Creat Ratio: UNDETERMINED mg/g (ref 0.0–30.0)
Microalb, Ur: <0.7 mg/dL

## 2024-04-20 LAB — TSH: TSH: 0.01 u[IU]/mL — ABNORMAL LOW (ref 0.35–5.50)

## 2024-04-20 MED ORDER — DOXYCYCLINE HYCLATE 100 MG PO TABS: 100.0000 mg | ORAL_TABLET | Freq: Two times a day (BID) | ORAL | 0 refills | Status: AC

## 2024-04-20 NOTE — Progress Notes (Signed)
 Complete physical exam  Patient: Justin Mason   DOB: Jul 29, 1980   43 y.o. Male  MRN: [id number]  Subjective:    Chief Complaint  Patient presents with   Annual Exam    Justin Mason is a 43 y.o. male who presents today for a complete physical exam. He reports consuming a general diet. Exercise: Working around the farm. He generally feels well. He reports sleeping fairly well. He does not have additional problems to discuss today.    Most recent fall risk assessment:    04/20/2024   11:22 AM  Fall Risk   Falls in the past year? 0  Number falls in past yr: 0  Injury with Fall? 0  Risk for fall due to : No Fall Risks  Follow up Falls evaluation completed     Most recent depression screenings:    04/20/2024   11:22 AM 02/08/2024    4:38 PM  PHQ 2/9 Scores  PHQ - 2 Score 0   PHQ- 9 Score 0   Exception Documentation  Patient refusal    Vision:Not within last year  and Dental: No current dental problems, Receives regular dental care, and No regular dental care   Past Medical History:  Diagnosis Date   Acid reflux    AKI (acute kidney injury)    Arthritis    Cardiac murmur 05/24/2020   Cellulitis 01/03/2015   Cellulitis of right leg    Cellulitis of right lower extremity 01/03/2015   COVID    Depression    Diabetes (HCC)    Pre Diabetes   Essential hypertension    GERD (gastroesophageal reflux disease)    Gout    Gout    High cholesterol    Hyperlipemia    Hypertension    Morbid obesity (HCC) 05/24/2020   OSA (obstructive sleep apnea)    Rotator cuff tear, right 09/08/2011   Seizures (HCC)    Sepsis (HCC) 01/03/2015   Stroke (HCC)    Stroke (HCC)    Type 2 diabetes mellitus with hyperglycemia (HCC)    Past Surgical History:  Procedure Laterality Date   blood clot left side brain     Frontal   INCISION AND DRAINAGE FOOT  1991-1992   STEPPED ON NAIL / INFECTED WOUND   IR ANGIO INTRA EXTRACRAN SEL COM CAROTID INNOMINATE BILAT MOD SED  07/24/2020   IR  ANGIO VERTEBRAL SEL SUBCLAVIAN INNOMINATE UNI L MOD SED  07/24/2020   IR ANGIO VERTEBRAL SEL VERTEBRAL UNI R MOD SED  07/24/2020   left foot surgery     age 56   RADIOLOGY WITH ANESTHESIA N/A 07/24/2020   Procedure: IR WITH ANESTHESIA - CODE STROKE;  Surgeon: Radiologist, Medication, MD;  Location: [facility] OR;  Service: Radiology;  Laterality: N/A;   SHOULDER SURGERY     September 08, 2011   Social History   Tobacco Use   Smoking status: Never   Smokeless tobacco: Current    Types: Chew  Vaping Use   Vaping status: Never Used  Substance Use Topics   Alcohol use: Not Currently    Comment: 1x week   Drug use: No   Social History   Socioeconomic History   Marital status: Single    Spouse name: Not on file   Number of children: Not on file   Years of education: Not on file   Highest education level: Not on file  Occupational History   Not on file  Tobacco Use   Smoking  status: Never   Smokeless tobacco: Current    Types: Chew  Vaping Use   Vaping status: Never Used  Substance and Sexual Activity   Alcohol use: Not Currently    Comment: 1x week   Drug use: No   Sexual activity: Not Currently  Other Topics Concern   Not on file  Social History Narrative   Lives with mom and girlfriend   Right Handed   Drinks caffeinated tea   Social Drivers of Health   Financial Resource Strain: Not on file  Food Insecurity: Not on file  Transportation Needs: Not on file  Physical Activity: Not on file  Stress: Not on file  Social Connections: Not on file  Intimate Partner Violence: Not on file   Family Status  Relation Name Status   Mother  Alive   Father  Alive   Sister  (Not Specified)   MGF  (Not Specified)   Other Grandfather (Not Specified)  No partnership data on file   Family History  Problem Relation Age of Onset   Hyperlipidemia Mother    Vision loss Mother    Asthma Mother    Arthritis Mother    Heart disease Mother    COPD Mother    Heart attack Mother    Gout  Father    Anesthesia problems Sister    Colon cancer Maternal Grandfather    Stroke Maternal Grandfather    Prostate cancer Other    Colon cancer Other    No Known Allergies   Patient Care Team: [place], Peter M, MD as PCP - Cardiology (Cardiology) Billy Philippe SAUNDERS, NP as Nurse Practitioner (Family Medicine) Whitfield Raisin, NP as Nurse Practitioner (Neurology) [facility]   Outpatient Medications Prior to Visit  Medication Sig   acetaminophen  (TYLENOL ) 325 MG tablet Take 1-2 tablets (325-650 mg total) by mouth every 4 (four) hours as needed for mild pain.   allopurinol  (ZYLOPRIM ) 100 MG tablet Take 1 tablet (100 mg total) by mouth daily.   Ascorbic Acid  (VITAMIN C PO) Take 1 tablet by mouth every evening.   aspirin  EC 81 MG tablet Take 81 mg by mouth daily. Swallow whole.   baclofen  (LIORESAL ) 10 MG tablet TAKE 1 TABLET BY MOUTH 3 TIMES DAILY   chlorthalidone  (HYGROTON ) 25 MG tablet Take 1 tablet (25 mg total) by mouth daily.   Cholecalciferol  (VITAMIN D -3) 25 MCG (1000 UT) CAPS Take 1,000 Units by mouth every evening.   colchicine  0.6 MG tablet Take 1 tablet (0.6 mg total) by mouth daily.   colchicine  0.6 MG tablet take 2 tablets now and then 1 tablet 1 hour later   diclofenac  Sodium (VOLTAREN ) 1 % GEL Apply 2 g topically 4 (four) times daily.   hydrALAZINE  (APRESOLINE ) 50 MG tablet Take 1 tablet (50 mg total) by mouth 2 (two) times daily.   irbesartan  (AVAPRO ) 300 MG tablet TAKE 1 TABLET BY MOUTH DAILY   levETIRAcetam  (KEPPRA ) 750 MG tablet Take 1 tablet (750 mg total) by mouth 2 (two) times daily.   metFORMIN  (GLUCOPHAGE -XR) 500 MG 24 hr tablet Take 500 mg by mouth daily.   metoprolol  tartrate (LOPRESSOR ) 50 MG tablet Take 1 tablet (50 mg total) by mouth 2 (two) times daily.   Multiple Vitamin (MULTIVITAMIN WITH MINERALS) TABS tablet Take 1 tablet by mouth daily.   pantoprazole  (PROTONIX ) 40 MG tablet TAKE 1 TABLET BY MOUTH DAILY   potassium chloride   (KLOR-CON ) 10 MEQ tablet TAKE 1 TABLET BY MOUTH DAILY  rosuvastatin  (CRESTOR ) 10 MG tablet Take 1 tablet (10 mg total) by mouth daily.   sertraline  (ZOLOFT ) 25 MG tablet TAKE 1 TABLET BY MOUTH DAILY   zinc  gluconate 50 MG tablet Take 50 mg by mouth daily.   [DISCONTINUED] potassium chloride  (KLOR-CON ) 10 MEQ tablet TAKE 2 TABLETS BY MOUTH DAILY   No facility-administered medications prior to visit.   Review of Systems  Constitutional: Negative.   HENT: Negative.    Eyes: Negative.   Respiratory: Negative.    Cardiovascular: Negative.   Gastrointestinal: Negative.   Genitourinary: Negative.   Musculoskeletal:  Positive for joint pain (Knee pain).  Skin: Negative.   Neurological: Negative.   Endo/Heme/Allergies: Negative.   Psychiatric/Behavioral: Negative.     See HPI above    Objective:   BP 132/86   Pulse 75   Temp 98 F (36.7 C) (Oral)   Ht 6' 1 (1.854 m)   Wt (!) 331 lb (150.1 kg)   SpO2 98%   BMI 43.67 kg/m  BP Readings from Last 3 Encounters:  04/20/24 132/86  02/08/24 (!) 153/91  11/05/23 (!) 131/90      Physical Exam Vitals reviewed.  Constitutional:      General: He is not in acute distress.    Appearance: Normal appearance. He is obese. He is not ill-appearing, toxic-appearing or diaphoretic.  HENT:     Head: Normocephalic and atraumatic.     Right Ear: Tympanic membrane, ear canal and external ear normal. There is no impacted cerumen.     Left Ear: Tympanic membrane, ear canal and external ear normal. There is no impacted cerumen.     Nose:     Right Sinus: No maxillary sinus tenderness or frontal sinus tenderness.     Left Sinus: No maxillary sinus tenderness or frontal sinus tenderness.     Mouth/Throat:     Mouth: Mucous membranes are moist.     Pharynx: Oropharynx is clear. Uvula midline. No pharyngeal swelling, oropharyngeal exudate, posterior oropharyngeal erythema or uvula swelling.  Eyes:     General:        Right eye: No discharge.         Left eye: No discharge.     Conjunctiva/sclera: Conjunctivae normal.     Pupils: Pupils are equal, round, and reactive to light.  Neck:     Thyroid: No thyromegaly.  Cardiovascular:     Rate and Rhythm: Normal rate and regular rhythm.     Pulses:          Posterior tibial pulses are 2+ on the right side and 2+ on the left side.     Heart sounds: Normal heart sounds. No murmur heard.    No friction rub. No gallop.  Pulmonary:     Effort: Pulmonary effort is normal. No respiratory distress.     Breath sounds: Normal breath sounds.  Abdominal:     General: Bowel sounds are normal. There is no distension.     Palpations: Abdomen is soft.  Musculoskeletal:        General: Normal range of motion.     Cervical back: Normal range of motion.     Right knee: Crepitus present.     Left knee: Crepitus present.     Right lower leg: No edema.     Left lower leg: No edema.  Lymphadenopathy:     Cervical: No cervical adenopathy.  Skin:    General: Skin is warm and dry.     Findings: Abscess (Left  upper back-see picture included) present.  Neurological:     General: No focal deficit present.     Mental Status: He is alert and oriented to person, place, and time. Mental status is at baseline.     Motor: Weakness present.     Gait: Gait abnormal Joelyn).  Psychiatric:        Mood and Affect: Mood normal.        Behavior: Behavior normal.        Thought Content: Thought content normal.        Judgment: Judgment normal.         Assessment & Plan:    Routine Health Maintenance and Physical Exam  Immunization History  Administered Date(s) Administered   DT (Pediatric) 05/27/1981, 11/24/1981, 06/01/1982, 11/24/1984, 01/16/1986   Td 05/05/2018    Health Maintenance  Topic Date Due   COVID-19 Vaccine (1) Never done   Diabetic kidney evaluation - Urine ACR  Never done   Pneumococcal Vaccine (1 of 2 - PCV) Never done   Hepatitis B Vaccines 19-59 Average Risk (1 of 3 - 19+ 3-dose series)  Never done   HPV VACCINES (1 - 3-dose SCDM series) Never done   OPHTHALMOLOGY EXAM  11/18/2023   Influenza Vaccine  Never done   HEMOGLOBIN A1C  04/04/2024   FOOT EXAM  10/03/2024   Diabetic kidney evaluation - eGFR measurement  11/03/2024   Medicare Annual Wellness (AWV)  02/07/2025   DTaP/Tdap/Td (7 - Tdap) 05/05/2028   Hepatitis C Screening  Completed   HIV Screening  Completed   Meningococcal B Vaccine  Aged Out    Discussed health benefits of physical activity, and encouraged him to engage in regular exercise appropriate for his age and condition.  Annual physical exam  Controlled type 2 diabetes mellitus with hyperglycemia, without long-term current use of insulin  (HCC) -     Microalbumin / creatinine urine ratio -     Comprehensive metabolic panel with GFR -     Hemoglobin A1c -     Ambulatory referral to Ophthalmology  Essential hypertension -     Comprehensive metabolic panel with GFR -     Ambulatory referral to Ophthalmology  High cholesterol -     Lipid panel -     Ambulatory referral to Ophthalmology  Chronic pain of left knee -     Ambulatory referral to Orthopedic Surgery  Chronic pain of right knee -     Ambulatory referral to Orthopedic Surgery  Cutaneous abscess of back excluding buttocks -     Doxycycline  Hyclate; Take 1 tablet (100 mg total) by mouth 2 (two) times daily for 7 days.  Dispense: 14 tablet; Refill: 0  Morbid obesity with BMI of 40.0-44.9, adult (HCC) -     CBC with Differential/Platelet -     Comprehensive metabolic panel with GFR -     Hemoglobin A1c -     Lipid panel -     TSH  1.Review health maintenance: -Covid booster: Declines  -Hep B vaccine: Probably had as a child -HPV vaccine: Not had  -Influenza vaccine: Declined -PNA vaccine: Declines  -Ophthalmology: Placed a referral  2.Physical exam completed today.  3.Ordered labs. Office will call with lab results and will be available via MyChart.  4.Placed a referral to  ophthalmology for history of diabetes, high blood pressure, and high cholesterol.  5.Placed a referral to ortho for chronic left and right knee pain.Patient has previously been seen  by [facility] with Dr. [name] Blanch back in 05/22. Received injection in left knee. Please call the office or send a MyChart message if you do not receive a phone call or a MyChart message about appointment in 2 weeks.  6.Prescribed Doxycycline  100mg  tablet, take 1 tablet every 12 hours for 7 days for abscess on back. If not improved after completing medication, send a MyChart message, will send to dermatology.  7.Continue to stay active and work on a healthy diet. 8.Blood pressure is elevated. More likely from taking blood pressure medication. Monitor blood pressure with taking medication, if not below 130/80, follow up sooner.  Return in about 6 months (around 10/19/2024) for chronic management.    Madylyn Insco, NP

## 2024-04-20 NOTE — Patient Instructions (Addendum)
-  It was so good to see you today! Always amazed how far you have come! -Physical exam completed today.  -Ordered labs. Office will call with lab results and will be available via MyChart.  -Placed a referral to ophthalmology for history of diabetes, high blood pressure, and high cholesterol.  -Placed a referral to ortho for chronic left and right knee pain. Please call the office or send a MyChart message if you do not receive a phone call or a MyChart message about appointment in 2 weeks.  -Prescribed Doxycycline  100mg  tablet, take 1 tablet every 12 hours for 7 days for abscess on back. If not improved after completing medication, send a MyChart message, will send to dermatology.  -Continue to stay active and work on a healthy diet. -Blood pressure is elevated. More likely from taking blood pressure medication. Monitor blood pressure with taking medication, if not below 130/80, follow up sooner.  -Follow up in 6 months.

## 2024-04-21 ENCOUNTER — Ambulatory Visit: Payer: Self-pay | Admitting: Family Medicine

## 2024-04-21 DIAGNOSIS — E1165 Type 2 diabetes mellitus with hyperglycemia: Secondary | ICD-10-CM

## 2024-04-21 DIAGNOSIS — E876 Hypokalemia: Secondary | ICD-10-CM

## 2024-04-21 DIAGNOSIS — E0590 Thyrotoxicosis, unspecified without thyrotoxic crisis or storm: Secondary | ICD-10-CM

## 2024-04-21 MED ORDER — METFORMIN HCL ER 500 MG PO TB24: 500.0000 mg | ORAL_TABLET | Freq: Two times a day (BID) | ORAL | 2 refills | Status: DC

## 2024-04-21 MED ORDER — POTASSIUM CHLORIDE CRYS ER 20 MEQ PO TBCR: 20.0000 meq | EXTENDED_RELEASE_TABLET | Freq: Every day | ORAL | 3 refills | Status: AC

## 2024-04-24 NOTE — Addendum Note (Signed)
 Addended by: ELNER NANNY B on: 04/24/2024 09:36 AM   Modules accepted: Orders

## 2024-04-25 ENCOUNTER — Other Ambulatory Visit: Payer: Self-pay | Admitting: Family Medicine

## 2024-05-02 ENCOUNTER — Other Ambulatory Visit: Payer: Self-pay

## 2024-05-02 ENCOUNTER — Other Ambulatory Visit (INDEPENDENT_AMBULATORY_CARE_PROVIDER_SITE_OTHER): Payer: Medicare (Managed Care)

## 2024-05-02 ENCOUNTER — Encounter: Payer: Self-pay | Admitting: Sports Medicine

## 2024-05-02 ENCOUNTER — Ambulatory Visit: Payer: Medicare (Managed Care) | Admitting: Sports Medicine

## 2024-05-02 VITALS — BP 97/64 | HR 65

## 2024-05-02 DIAGNOSIS — G8191 Hemiplegia, unspecified affecting right dominant side: Secondary | ICD-10-CM

## 2024-05-02 DIAGNOSIS — M25562 Pain in left knee: Secondary | ICD-10-CM

## 2024-05-02 DIAGNOSIS — G8929 Other chronic pain: Secondary | ICD-10-CM | POA: Diagnosis not present

## 2024-05-02 DIAGNOSIS — E1165 Type 2 diabetes mellitus with hyperglycemia: Secondary | ICD-10-CM | POA: Diagnosis not present

## 2024-05-02 DIAGNOSIS — M25561 Pain in right knee: Secondary | ICD-10-CM

## 2024-05-02 DIAGNOSIS — M170 Bilateral primary osteoarthritis of knee: Secondary | ICD-10-CM | POA: Diagnosis not present

## 2024-05-02 DIAGNOSIS — M24541 Contracture, right hand: Secondary | ICD-10-CM

## 2024-05-02 MED ORDER — LIDOCAINE HCL 1 % IJ SOLN
2.0000 mL | INTRAMUSCULAR | Status: AC | PRN
  Administered 2024-05-02: 2 mL

## 2024-05-02 MED ORDER — BUPIVACAINE HCL 0.25 % IJ SOLN
2.0000 mL | INTRAMUSCULAR | Status: AC | PRN
  Administered 2024-05-02: 2 mL via INTRA_ARTICULAR

## 2024-05-02 MED ORDER — METHYLPREDNISOLONE ACETATE 40 MG/ML IJ SUSP
40.0000 mg | INTRAMUSCULAR | Status: AC | PRN
  Administered 2024-05-02: 40 mg via INTRA_ARTICULAR

## 2024-05-02 NOTE — Progress Notes (Signed)
 Patient says that he had a stroke in 2022, which is also when he was first seen and treated for knee pain. He used to work where he was often on his knees. When he was seen in 2022, they found an ACL tear in the left knee, but were unable to repair it due to the stroke. He had a steroid injection that gave him relief for quite some time. He is having bilateral knee pain now. He is still able to get onto his mower and his tractor, although he does not do that often. He is taking Baclofen , but denies any other form of treatment for his knee pain at home.  Blood sugar 4:20pm - 166
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
## 2024-05-08 ENCOUNTER — Encounter: Payer: Self-pay | Admitting: Radiology

## 2024-05-09 ENCOUNTER — Other Ambulatory Visit: Payer: Self-pay | Admitting: Family Medicine

## 2024-05-09 DIAGNOSIS — K219 Gastro-esophageal reflux disease without esophagitis: Secondary | ICD-10-CM

## 2024-05-22 ENCOUNTER — Other Ambulatory Visit: Payer: Medicare (Managed Care)

## 2024-05-23 ENCOUNTER — Other Ambulatory Visit: Payer: Self-pay | Admitting: Family Medicine

## 2024-05-23 DIAGNOSIS — E0590 Thyrotoxicosis, unspecified without thyrotoxic crisis or storm: Secondary | ICD-10-CM

## 2024-05-23 DIAGNOSIS — E876 Hypokalemia: Secondary | ICD-10-CM

## 2024-05-25 ENCOUNTER — Other Ambulatory Visit: Payer: Medicare (Managed Care)

## 2024-05-25 ENCOUNTER — Ambulatory Visit: Payer: Self-pay | Admitting: Family Medicine

## 2024-05-25 DIAGNOSIS — E876 Hypokalemia: Secondary | ICD-10-CM | POA: Diagnosis not present

## 2024-05-25 DIAGNOSIS — R748 Abnormal levels of other serum enzymes: Secondary | ICD-10-CM

## 2024-05-25 DIAGNOSIS — E0590 Thyrotoxicosis, unspecified without thyrotoxic crisis or storm: Secondary | ICD-10-CM

## 2024-05-25 DIAGNOSIS — R7989 Other specified abnormal findings of blood chemistry: Secondary | ICD-10-CM

## 2024-05-25 LAB — T4, FREE: Free T4: 0.76 ng/dL (ref 0.60–1.60)

## 2024-05-25 LAB — T3, FREE: T3, Free: 3.4 pg/mL (ref 2.3–4.2)

## 2024-05-25 LAB — COMPREHENSIVE METABOLIC PANEL WITH GFR
ALT: 54 U/L — ABNORMAL HIGH (ref 0–53)
AST: 40 U/L — ABNORMAL HIGH (ref 0–37)
Albumin: 4.3 g/dL (ref 3.5–5.2)
Alkaline Phosphatase: 60 U/L (ref 39–117)
BUN: 8 mg/dL (ref 6–23)
CO2: 33 meq/L — ABNORMAL HIGH (ref 19–32)
Calcium: 9.5 mg/dL (ref 8.4–10.5)
Chloride: 96 meq/L (ref 96–112)
Creatinine, Ser: 0.89 mg/dL (ref 0.40–1.50)
GFR: 105.22 mL/min (ref >60.00)
Glucose, Bld: 224 mg/dL — ABNORMAL HIGH (ref 70–99)
Potassium: 3.6 meq/L (ref 3.5–5.1)
Sodium: 138 meq/L (ref 135–145)
Total Bilirubin: 0.7 mg/dL (ref 0.2–1.2)
Total Protein: 6.9 g/dL (ref 6.0–8.3)

## 2024-05-25 LAB — TSH: TSH: 5.57 u[IU]/mL — ABNORMAL HIGH (ref 0.35–5.50)

## 2024-05-29 ENCOUNTER — Other Ambulatory Visit: Payer: Self-pay

## 2024-05-29 ENCOUNTER — Encounter: Payer: Self-pay | Admitting: Occupational Therapy

## 2024-05-29 ENCOUNTER — Ambulatory Visit: Payer: Medicare (Managed Care) | Attending: Sports Medicine | Admitting: Occupational Therapy

## 2024-05-29 DIAGNOSIS — R29818 Other symptoms and signs involving the nervous system: Secondary | ICD-10-CM | POA: Insufficient documentation

## 2024-05-29 DIAGNOSIS — R278 Other lack of coordination: Secondary | ICD-10-CM | POA: Insufficient documentation

## 2024-05-29 DIAGNOSIS — M25641 Stiffness of right hand, not elsewhere classified: Secondary | ICD-10-CM | POA: Insufficient documentation

## 2024-05-29 DIAGNOSIS — M6281 Muscle weakness (generalized): Secondary | ICD-10-CM | POA: Diagnosis not present

## 2024-05-29 DIAGNOSIS — I69351 Hemiplegia and hemiparesis following cerebral infarction affecting right dominant side: Secondary | ICD-10-CM | POA: Diagnosis not present

## 2024-05-29 DIAGNOSIS — M24541 Contracture, right hand: Secondary | ICD-10-CM | POA: Insufficient documentation

## 2024-05-29 DIAGNOSIS — G8191 Hemiplegia, unspecified affecting right dominant side: Secondary | ICD-10-CM | POA: Insufficient documentation

## 2024-05-29 NOTE — Therapy (Signed)
 OUTPATIENT OCCUPATIONAL THERAPY NEURO EVALUATION  Patient Name: Justin Mason MRN: [id number] DOB:23-Apr-1981, 43 y.o., male Today's Date: 05/29/2024  PCP: Billy Craze, NP REFERRING PROVIDER: Burnetta Brunet DO  END OF SESSION:  OT End of Session - 05/29/24 1211     Visit Number 1    Number of Visits 25    Date for Recertification  08/21/24    Authorization Type [employer] MCR    Authorization Time Period 12 weeks, 1-2 x week    Authorization - Visit Number 1    Progress Note Due on Visit 10    OT Start Time 1105    OT Stop Time 1145    OT Time Calculation (min) 40 min    Activity Tolerance Patient tolerated treatment well    Behavior During Therapy WFL for tasks assessed/performed          Past Medical History:  Diagnosis Date   Acid reflux    AKI (acute kidney injury)    Arthritis    Cardiac murmur 05/24/2020   Cellulitis 01/03/2015   Cellulitis of right leg    Cellulitis of right lower extremity 01/03/2015   COVID    Depression    Diabetes (HCC)    Pre Diabetes   Essential hypertension    GERD (gastroesophageal reflux disease)    Gout    Gout    High cholesterol    Hyperlipemia    Hypertension    Morbid obesity (HCC) 05/24/2020   OSA (obstructive sleep apnea)    Rotator cuff tear, right 09/08/2011   Seizures (HCC)    Sepsis (HCC) 01/03/2015   Stroke (HCC)    Stroke (HCC)    Type 2 diabetes mellitus with hyperglycemia (HCC)    Past Surgical History:  Procedure Laterality Date   blood clot left side brain     Frontal   INCISION AND DRAINAGE FOOT  1991-1992   STEPPED ON NAIL / INFECTED WOUND   IR ANGIO INTRA EXTRACRAN SEL COM CAROTID INNOMINATE BILAT MOD SED  07/24/2020   IR ANGIO VERTEBRAL SEL SUBCLAVIAN INNOMINATE UNI L MOD SED  07/24/2020   IR ANGIO VERTEBRAL SEL VERTEBRAL UNI R MOD SED  07/24/2020   left foot surgery     age 67   RADIOLOGY WITH ANESTHESIA N/A 07/24/2020   Procedure: IR WITH ANESTHESIA - CODE STROKE;  Surgeon: Radiologist,  Medication, MD;  Location: [facility] OR;  Service: Radiology;  Laterality: N/A;   SHOULDER SURGERY     September 08, 2011   Patient Active Problem List   Diagnosis Date Noted   Depression 09/30/2022   Primary osteoarthritis of left knee 11/07/2020   Controlled type 2 diabetes mellitus with hyperglycemia, without long-term current use of insulin  (HCC) 09/06/2020   Controlled gout 09/06/2020   Chronic pain of left knee    Expressive aphasia    Chronic pain of right knee    Right elbow pain    Right hemiparesis (HCC)    Right hemiplegia (HCC)    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   Middle cerebral artery stenosis, 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   Morbid obesity (HCC) 05/24/2020   Cardiac murmur 05/24/2020   Arthritis    High cholesterol    GERD (gastroesophageal reflux disease)    Essential hypertension    Rotator cuff tear, right 09/08/2011    ONSET DATE: 05/02/24- referral date  REFERRING DIAG:  Diagnosis  G81.91 (ICD-10-CM) - Right hemiparesis (HCC)  M24.541 (ICD-10-CM) - Contracture of right hand    THERAPY DIAG:  Hemiplegia and hemiparesis following cerebral infarction affecting right dominant side (HCC) - Plan: Ot plan of care cert/re-cert  Muscle weakness (generalized) - Plan: Ot plan of care cert/re-cert  Other lack of coordination - Plan: Ot plan of care cert/re-cert  Other symptoms and signs involving the nervous system - Plan: Ot plan of care cert/re-cert  Stiffness of right hand, not elsewhere classified  Rationale for Evaluation and Treatment: Rehabilitation  SUBJECTIVE:   SUBJECTIVE STATEMENT: Pt's mom report he really didn't get OP therapy after his CVA due to insurance Pt accompanied by: family member, mom [name]  PERTINENT HISTORY: Pt s/p MCA stroke back in 2022 which left him with verbal deficits as well as paresis of the right upper extremity and right lower extremity.PMH bilateral osteoarthritis of knee, DM, hx of seizures  PRECAUTIONS: Other: hx of seizures WEIGHT  BEARING RESTRICTIONS: No  PAIN:  Are you having pain? No  FALLS: Has patient fallen in last 6 months? No  LIVING ENVIRONMENT: Lives with: lives with their family Lives in: House/apartment   PLOF: Needs assistance with ADLs and Needs assistance with homemaking  PATIENT GOALS: improve RUE function, I with ADLs  OBJECTIVE:  Note: Objective measures were completed at Evaluation unless otherwise noted.  HAND DOMINANCE: Left, was R hand dominant prior to CVA, uses RUE only 10% of the time  ADLs: Overall ADLs: increase time using LUE Transfers/ambulation related to ADLs: Eating: uses LUE  UB Dressing: min A with shirt LB Dressing: mod I Toileting: mod I Bathing: min A-mod I for back, arms    IADLs: Pt mows the yard   MOBILITY STATUS: mod I with cane     UPPER EXTREMITY ROM:  LUE WFLS  Active ROM Right eval Left eval  Shoulder flexion 30* A/ 85* P   Shoulder abduction    Shoulder adduction    Shoulder extension    Shoulder internal rotation    Shoulder external rotation    Elbow flexion 110   Elbow extension -20   Wrist flexion 50   Wrist extension 70   Wrist ulnar deviation    Wrist radial deviation    Wrist pronation WFL   Wrist supination to neutral   (Blank rows = not tested)  RUE-Grossly 95% composite finger flexion, Pt with hyperextension at PIP joint middle finger with extension, 5th digit is flexed at PIP and hyperextended at MP, Pt maintains thumb in radial abduction however he is able to perform opposition to all digits with difficulty HAND FUNCTION: Grip strength: Right: 105 lbs; Left: 38 lbs  COORDINATION: Box and Blocks:  Right 9blocks, Left 50blocks  SENSATION: WFL    MUSCLE TONE: RUE: Moderate and Hypertonic  COGNITION: Overall cognitive status: Difficulty to assess due to: Communication impairment      OBSERVATIONS: Pleasant male with aphasia, accompannied by his mother [name]  TREATMENT DATE: 05/29/24 eval Table slides with bilateral UE's for shoulder flexion, supination/ pronation with passive stretch in supination mod v.c Flipping and dealing cards with RUE, LUE assisting with pulling card off deck, mod v.c facilitation        PATIENT EDUCATION: Education details: role of OT, potential goals Person educated: Patient, mom Education method: Explanation Education comprehension: verbalized understanding  HOME EXERCISE PROGRAM: n/a   GOALS: Goals reviewed with patient? Yes  SHORT TERM GOALS: Target date: 06/28/24  I with RUE positioning including splinting to maximize function and decrease risk for contracture  Goal status: INITIAL  2.  I with inital HEP  Goal status: INITIAL  3.  Pt will demonstrate improve RUE functional use as evidenced by increasing 9 hole peg test to 12 blocks or greater.  Goal status: INITIAL  4.  Pt will consistently donn shirt mod I   Goal status: INITIAL  5.  Pt will demonstrate 40* shoulder flexion in prep for functional reach  Goal status: INITIAL 6. I with adapted strategies/ AE to increase safety and I with ADLs/IADLs.  Goal status: inital    LONG TERM GOALS: Target date: 08/21/24  I with updated HEP  Goal status: INITIAL  2.   Pt will demonstrate 50* shoulder flexion in prep for functional reach   Goal status: INITIAL  3.  Pt will increase RUE grip strength to 45 lbs or greater for improved function.  Goal status: INITIAL  4.  Pt will bathe mod I consistently.   Goal status: INITIAL  5.  Pt will use RUE as a gross A at least 20% of the time for ADLs/IADLs.   Goal status: INITIAL    ASSESSMENT:  CLINICAL IMPRESSION: Patient is a 43 y.o. male who was seen today for occupational therapy evaluation for Rhemiparesis s/p CVA in 2022, and R hand contracture. Pt presents with the performance deficits below. He can benefit  from skilled occupational therapy to address these deficits in order to maximize pt's safety and I with ADLs/ IADLs.   SABRA   PERFORMANCE DEFICITS: in functional skills including ADLs, IADLs, coordination, dexterity, tone, ROM, strength, flexibility, Fine motor control, Gross motor control, mobility, balance, decreased knowledge of precautions, decreased knowledge of use of DME, and UE functional use,  and psychosocial skills including coping strategies, environmental adaptation, habits, interpersonal interactions, and routines and behaviors.   IMPAIRMENTS: are limiting patient from ADLs, IADLs, play, leisure, and social participation.   CO-MORBIDITIES: may have co-morbidities  that affects occupational performance. Patient will benefit from skilled OT to address above impairments and improve overall function.  MODIFICATION OR ASSISTANCE TO COMPLETE EVALUATION: No modification of tasks or assist necessary to complete an evaluation.  OT OCCUPATIONAL PROFILE AND HISTORY: Detailed assessment: Review of records and additional review of physical, cognitive, psychosocial history related to current functional performance.  CLINICAL DECISION MAKING: LOW - limited treatment options, no task modification necessary  REHAB POTENTIAL: Good  EVALUATION COMPLEXITY: Low    PLAN:  OT FREQUENCY: 1-2x/week  OT DURATION: 12 weeks  PLANNED INTERVENTIONS: 97168 OT Re-evaluation, 97535 self care/ADL training, 02889 therapeutic exercise, 97530 therapeutic activity, 97112 neuromuscular re-education, 97140 manual therapy, 97113 aquatic therapy, 97035 ultrasound, 97018 paraffin, 02989 moist heat, 97014 electrical stimulation unattended, 97760 Orthotic Initial, 97763 Orthotic/Prosthetic subsequent, passive range of motion, functional mobility training, energy conservation, coping strategies training, patient/family education, and DME and/or AE instructions  RECOMMENDED OTHER SERVICES: n/a  CONSULTED AND AGREED WITH  PLAN OF CARE: Patient and family member/caregiver  PLAN FOR NEXT SESSION: inital HEP   Caprisha Bridgett, OT 05/29/2024, 12:57 PM

## 2024-05-30 ENCOUNTER — Ambulatory Visit: Payer: Medicare (Managed Care) | Admitting: Occupational Therapy

## 2024-05-30 ENCOUNTER — Encounter: Payer: Self-pay | Admitting: Occupational Therapy

## 2024-05-30 DIAGNOSIS — M25641 Stiffness of right hand, not elsewhere classified: Secondary | ICD-10-CM

## 2024-05-30 DIAGNOSIS — R29818 Other symptoms and signs involving the nervous system: Secondary | ICD-10-CM

## 2024-05-30 DIAGNOSIS — M6281 Muscle weakness (generalized): Secondary | ICD-10-CM

## 2024-05-30 DIAGNOSIS — I69351 Hemiplegia and hemiparesis following cerebral infarction affecting right dominant side: Secondary | ICD-10-CM | POA: Diagnosis not present

## 2024-05-30 DIAGNOSIS — R278 Other lack of coordination: Secondary | ICD-10-CM

## 2024-05-30 NOTE — Patient Instructions (Signed)
    Clasp hands together and raise arms above head, laying down only keeping elbows as straight as possible. Can be done sitting or lying. Repeat __10__ times. Do 1-2___ sessions per day.      Copyright  [employer]. All rights reserved.  Shoulder: Flexion (Supine)    With hands shoulder width apart, slowly lower dowel to floor behind head. Do not let elbows bend. Keep back flat. Hold __3__ seconds. Repeat _10___ times. Do _1-2___ sessions per day. CAUTION: Stretch slowly and gently.   Laying on your side right side with arm stretched out to the side, bend and straighten elbow 10x then turn palm up and down 10x   SELF ASSISTED WITH OBJECT: Shoulder Flexion / Elbow Extension (Crutch)    Place one hand on cane. Move arm forward, straighten elbow. then make circles _10__ reps per set, _1-2__ sets per day, __7_ days per week

## 2024-05-30 NOTE — Therapy (Signed)
 OUTPATIENT OCCUPATIONAL THERAPY NEURO EVALUATION  Patient Name: Justin Mason MRN: [id number] DOB:[birth year], 43 y.o., male Today's Date: 05/30/2024  PCP: Justin Craze, NP REFERRING PROVIDER: Burnetta Brunet DO  END OF SESSION:  OT End of Session - 05/30/24 1649     Visit Number 2    Number of Visits 25    Authorization Type [facility]    Authorization Time Period 12 weeks, 1-2 x week    Authorization - Visit Number 2    OT Start Time 1403    OT Stop Time 1144    OT Time Calculation (min) 1301 min    Activity Tolerance Patient tolerated treatment well    Behavior During Therapy WFL for tasks assessed/performed          Past Medical History:  Diagnosis Date   Acid reflux    AKI (acute kidney injury)    Arthritis    Cardiac murmur 05/24/2020   Cellulitis 01/03/2015   Cellulitis of right leg    Cellulitis of right lower extremity 01/03/2015   COVID    Depression    Diabetes (HCC)    Pre Diabetes   Essential hypertension    GERD (gastroesophageal reflux disease)    Gout    Gout    High cholesterol    Hyperlipemia    Hypertension    Morbid obesity (HCC) 05/24/2020   OSA (obstructive sleep apnea)    Rotator cuff tear, right 09/08/2011   Seizures (HCC)    Sepsis (HCC) 01/03/2015   Stroke (HCC)    Stroke (HCC)    Type 2 diabetes mellitus with hyperglycemia (HCC)    Past Surgical History:  Procedure Laterality Date   blood clot left side brain     Frontal   INCISION AND DRAINAGE FOOT  1991-1992   STEPPED ON NAIL / INFECTED WOUND   IR ANGIO INTRA EXTRACRAN SEL COM CAROTID INNOMINATE BILAT MOD SED  07/24/2020   IR ANGIO VERTEBRAL SEL SUBCLAVIAN INNOMINATE UNI L MOD SED  07/24/2020   IR ANGIO VERTEBRAL SEL VERTEBRAL UNI R MOD SED  07/24/2020   left foot surgery     age 44   RADIOLOGY WITH ANESTHESIA N/A 07/24/2020   Procedure: IR WITH ANESTHESIA - CODE STROKE;  Surgeon: Radiologist, Medication, MD;  Location: [facility] OR;  Service: Radiology;  Laterality: N/A;    SHOULDER SURGERY     September 08, 2011   Patient Active Problem List   Diagnosis Date Noted   Depression 09/30/2022   Primary osteoarthritis of left knee 11/07/2020   Controlled type 2 diabetes mellitus with hyperglycemia, without long-term current use of insulin  (HCC) 09/06/2020   Controlled gout 09/06/2020   Chronic pain of left knee    Expressive aphasia    Chronic pain of right knee    Right elbow pain    Right hemiparesis (HCC)    Right hemiplegia (HCC)    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   Middle cerebral artery stenosis, 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   Morbid obesity (HCC) 05/24/2020   Cardiac murmur 05/24/2020   Arthritis    High cholesterol    GERD (gastroesophageal reflux disease)    Essential hypertension    Rotator cuff tear, right 09/08/2011    ONSET DATE: 05/02/24- referral date  REFERRING DIAG:  Diagnosis  G81.91 (ICD-10-CM) - Right hemiparesis (HCC)  M24.541 (ICD-10-CM) - Contracture of right hand    THERAPY DIAG:  Hemiplegia and hemiparesis following cerebral infarction affecting right dominant side (HCC)  Muscle weakness (generalized)  Other  lack of coordination  Other symptoms and signs involving the nervous system  Stiffness of right hand, not elsewhere classified  Rationale for Evaluation and Treatment: Rehabilitation  SUBJECTIVE:   SUBJECTIVE STATEMENT: Pt's mom report he really didn't get OP therapy after his CVA due to insurance Pt accompanied by: family member, mom Justin Mason  PERTINENT HISTORY: Pt s/p MCA stroke back in 2022 which 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 No  FALLS: Has patient fallen in last 6 months? No  LIVING ENVIRONMENT: Lives with: lives with their family Lives in: House/apartment   PLOF: Needs assistance with  ADLs and Needs assistance with homemaking  PATIENT GOALS: improve RUE function, I with ADLs  OBJECTIVE:  Note: Objective measures were completed at Evaluation unless otherwise noted.  HAND DOMINANCE: Left, was R hand dominant prior to CVA, uses RUE only 10% of the time  ADLs: Overall ADLs: increase time using LUE Transfers/ambulation related to ADLs: Eating: uses LUE  UB Dressing: min A with shirt LB Dressing: mod I Toileting: mod I Bathing: min A-mod I for back, arms    IADLs: Pt mows the yard   MOBILITY STATUS: mod I with cane     UPPER EXTREMITY ROM:  LUE WFLS  Active ROM Right eval Left eval  Shoulder flexion 30* A/ 85* P   Shoulder abduction    Shoulder adduction    Shoulder extension    Shoulder internal rotation    Shoulder external rotation    Elbow flexion 110   Elbow extension -20   Wrist flexion 50   Wrist extension 70   Wrist ulnar deviation    Wrist radial deviation    Wrist pronation WFL   Wrist supination to neutral   (Blank rows = not tested)  RUE-Grossly 95% composite finger flexion, Pt with hyperextension at PIP joint middle finger with extension, 5th digit is flexed at PIP and hyperextended at MP, Pt maintains thumb in radial abduction however he is able to perform opposition to all digits with difficulty HAND FUNCTION: Grip strength: Right: 105 lbs; Left: 38 lbs  COORDINATION: Box and Blocks:  Right 9blocks, Left 50blocks  SENSATION: WFL    MUSCLE TONE: RUE: Moderate and Hypertonic  COGNITION: Overall cognitive status: Difficulty to assess due to: Communication impairment      OBSERVATIONS: Pleasant male with aphasia, accompannied by his mother Justin Mason                                                                                                                             TREATMENT DATE: 05/30/24-Pt/ mom were instructed in inital HEP. See pt instructions, min-mod v.c and demo. Therapist trialed an oval 8 splint on pt's  right middle finger, it decreased pt's PIP hyperextension. Pt would  benefit from a size 11. Pt to check at home to see what was previously issued for him. Therapist did not issue splint.    05/29/24 eval Table slides with bilateral UE's for shoulder flexion, supination/ pronation with passive stretch in supination mod v.c Flipping and dealing cards with RUE, LUE assisting with pulling card off deck, mod v.c facilitation        PATIENT EDUCATION: Education details: inital HEP Person educated: Patient, mom Education method: Explanation, demonstration Education comprehension: verbalized understanding, returend demonstration  HOME EXERCISE PROGRAM: n/a   GOALS: Goals reviewed with patient? Yes  SHORT TERM GOALS: Target date: 06/28/24  I with RUE positioning including splinting to maximize function and decrease risk for contracture  Goal status:  ongoing 05/30/24  2.  I with inital HEP  Goal status: I ongoing 05/30/24  3.  Pt will demonstrate improve RUE functional use as evidenced by increasing 9 hole peg test to 12 blocks or greater.  Goal status: INITIAL  4.  Pt will consistently donn shirt mod I   Goal status: INITIAL  5.  Pt will demonstrate 40* shoulder flexion in prep for functional reach  Goal status: INITIAL 6. I with adapted strategies/ AE to increase safety and I with ADLs/IADLs.  Goal status: inital    LONG TERM GOALS: Target date: 08/21/24  I with updated HEP  Goal status: INITIAL  2.   Pt will demonstrate 50* shoulder flexion in prep for functional reach   Goal status: INITIAL  3.  Pt will increase RUE grip strength to 45 lbs or greater for improved function.  Goal status: INITIAL  4.  Pt will bathe mod I consistently.   Goal status: INITIAL  5.  Pt will use RUE as a gross A at least 20% of the time for ADLs/IADLs.   Goal status: INITIAL    ASSESSMENT:  CLINICAL IMPRESSION: Patient is progressing towards goals for inital HEP.  SABRA    PERFORMANCE DEFICITS: in functional skills including ADLs, IADLs, coordination, dexterity, tone, ROM, strength, flexibility, Fine motor control, Gross motor control, mobility, balance, decreased knowledge of precautions, decreased knowledge of use of DME, and UE functional use,  and psychosocial skills including coping strategies, environmental adaptation, habits, interpersonal interactions, and routines and behaviors.   IMPAIRMENTS: are limiting patient from ADLs, IADLs, play, leisure, and social participation.   CO-MORBIDITIES: may have co-morbidities  that affects occupational performance. Patient will benefit from skilled OT to address above impairments and improve overall function.  MODIFICATION OR ASSISTANCE TO COMPLETE EVALUATION: No modification of tasks or assist necessary to complete an evaluation.  OT OCCUPATIONAL PROFILE AND HISTORY: Detailed assessment: Review of records and additional review of physical, cognitive, psychosocial history related to current functional performance.  CLINICAL DECISION MAKING: LOW - limited treatment options, no task modification necessary  REHAB POTENTIAL: Good  EVALUATION COMPLEXITY: Low    PLAN:  OT FREQUENCY: 1-2x/week  OT DURATION: 12 weeks  PLANNED INTERVENTIONS: 97168 OT Re-evaluation, 97535 self care/ADL training, 02889 therapeutic exercise, 97530 therapeutic activity, 97112 neuromuscular re-education, 97140 manual therapy, 97113 aquatic therapy, 97035 ultrasound, 97018 paraffin, 02989 moist heat, 97014 electrical stimulation unattended, 97760 Orthotic Initial, 97763 Orthotic/Prosthetic subsequent, passive range of motion, functional mobility training, energy conservation, coping strategies training, patient/family education, and DME and/or AE instructions  RECOMMENDED OTHER SERVICES: n/a  CONSULTED AND AGREED WITH PLAN OF CARE: Patient and family member/caregiver  PLAN FOR NEXT SESSION: further assess splinting  needs   Mckena Chern, OT 05/30/2024, 4:50 PM

## 2024-06-05 ENCOUNTER — Encounter: Payer: Self-pay | Admitting: Occupational Therapy

## 2024-06-05 ENCOUNTER — Ambulatory Visit: Payer: Medicare (Managed Care) | Admitting: Occupational Therapy

## 2024-06-05 DIAGNOSIS — I69351 Hemiplegia and hemiparesis following cerebral infarction affecting right dominant side: Secondary | ICD-10-CM | POA: Diagnosis present

## 2024-06-05 DIAGNOSIS — R278 Other lack of coordination: Secondary | ICD-10-CM | POA: Insufficient documentation

## 2024-06-05 DIAGNOSIS — M6281 Muscle weakness (generalized): Secondary | ICD-10-CM | POA: Diagnosis present

## 2024-06-05 DIAGNOSIS — M25641 Stiffness of right hand, not elsewhere classified: Secondary | ICD-10-CM | POA: Diagnosis present

## 2024-06-05 DIAGNOSIS — R29818 Other symptoms and signs involving the nervous system: Secondary | ICD-10-CM | POA: Diagnosis present

## 2024-06-05 NOTE — Therapy (Signed)
 OUTPATIENT OCCUPATIONAL THERAPY NEURO EVALUATION  Patient Name: KENYATTE CHATMON MRN: [id number] DOB:[birth year], 43 y.o., male Today's Date: 06/05/2024  PCP: Billy Craze, NP REFERRING PROVIDER: Burnetta Brunet DO  END OF SESSION:  OT End of Session - 06/05/24 0814     Visit Number 3    Number of Visits 25    Date for Recertification  08/21/24    Authorization Type [employer]    Authorization Time Period 12 weeks, 1-2 x week    Authorization - Visit Number 3    Progress Note Due on Visit 10    OT Start Time 0802    OT Stop Time 0840    OT Time Calculation (min) 38 min    Activity Tolerance Patient tolerated treatment well    Behavior During Therapy [facility] for tasks assessed/performed          Past Medical History:  Diagnosis Date   Acid reflux    AKI (acute kidney injury)    Arthritis    Cardiac murmur 05/24/2020   Cellulitis 01/03/2015   Cellulitis of right leg    Cellulitis of right lower extremity 01/03/2015   COVID    Depression    Diabetes (HCC)    Pre Diabetes   Essential hypertension    GERD (gastroesophageal reflux disease)    Gout    Gout    High cholesterol    Hyperlipemia    Hypertension    Morbid obesity (HCC) 05/24/2020   OSA (obstructive sleep apnea)    Rotator cuff tear, right 09/08/2011   Seizures (HCC)    Sepsis (HCC) 01/03/2015   Stroke (HCC)    Stroke (HCC)    Type 2 diabetes mellitus with hyperglycemia (HCC)    Past Surgical History:  Procedure Laterality Date   blood clot left side brain     Frontal   INCISION AND DRAINAGE FOOT  1991-1992   STEPPED ON NAIL / INFECTED WOUND   IR ANGIO INTRA EXTRACRAN SEL COM CAROTID INNOMINATE BILAT MOD SED  07/24/2020   IR ANGIO VERTEBRAL SEL SUBCLAVIAN INNOMINATE UNI L MOD SED  07/24/2020   IR ANGIO VERTEBRAL SEL VERTEBRAL UNI R MOD SED  07/24/2020   left foot surgery     age 19   RADIOLOGY WITH ANESTHESIA N/A 07/24/2020   Procedure: IR WITH ANESTHESIA - CODE STROKE;  Surgeon: Radiologist,  Medication, MD;  Location: [facility] OR;  Service: Radiology;  Laterality: N/A;   SHOULDER SURGERY     September 08, 2011   Patient Active Problem List   Diagnosis Date Noted   Depression 09/30/2022   Primary osteoarthritis of left knee 11/07/2020   Controlled type 2 diabetes mellitus with hyperglycemia, without long-term current use of insulin  (HCC) 09/06/2020   Controlled gout 09/06/2020   Chronic pain of left knee    Expressive aphasia    Chronic pain of right knee    Right elbow pain    Right hemiparesis (HCC)    Right hemiplegia (HCC)    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   Middle cerebral artery stenosis, 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   Morbid obesity (HCC) 05/24/2020   Cardiac murmur 05/24/2020   Arthritis    High cholesterol    GERD (gastroesophageal reflux disease)    Essential hypertension    Rotator cuff tear, right 09/08/2011    ONSET DATE: 05/02/24- referral date  REFERRING DIAG:  Diagnosis  G81.91 (ICD-10-CM) - Right hemiparesis (HCC)  M24.541 (ICD-10-CM) - Contracture of right hand    THERAPY DIAG:  Hemiplegia and hemiparesis following cerebral infarction affecting right dominant side (HCC)  Muscle weakness (generalized)  Other lack of coordination  Other symptoms and signs involving the nervous system  Stiffness of right hand, not elsewhere classified  Rationale for Evaluation and Treatment: Rehabilitation  SUBJECTIVE:   SUBJECTIVE STATEMENT: Pt's mom report he really didn't get OP therapy after his CVA due to insurance Pt accompanied by: family member, mom [name]  PERTINENT HISTORY: Pt s/p MCA stroke back in 2022 which 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 No  FALLS: Has patient fallen in last 6 months? No  LIVING ENVIRONMENT: Lives with: lives  with their family Lives in: House/apartment   PLOF: Needs assistance with ADLs and Needs assistance with homemaking  PATIENT GOALS: improve RUE function, I with ADLs  OBJECTIVE:  Note: Objective measures were completed at Evaluation unless otherwise noted.  HAND DOMINANCE: Left, was R hand dominant prior to CVA, uses RUE only 10% of the time  ADLs: Overall ADLs: increase time using LUE Transfers/ambulation related to ADLs: Eating: uses LUE  UB Dressing: min A with shirt LB Dressing: mod I Toileting: mod I Bathing: min A-mod I for back, arms    IADLs: Pt mows the yard   MOBILITY STATUS: mod I with cane     UPPER EXTREMITY ROM:  LUE WFLS  Active ROM Right eval Left eval  Shoulder flexion 30* A/ 85* P   Shoulder abduction    Shoulder adduction    Shoulder extension    Shoulder internal rotation    Shoulder external rotation    Elbow flexion 110   Elbow extension -20   Wrist flexion 50   Wrist extension 70   Wrist ulnar deviation    Wrist radial deviation    Wrist pronation WFL   Wrist supination to neutral   (Blank rows = not tested)  RUE-Grossly 95% composite finger flexion, Pt with hyperextension at PIP joint middle finger with extension, 5th digit is flexed at PIP and hyperextended at MP, Pt maintains thumb in radial abduction however he is able to perform opposition to all digits with difficulty HAND FUNCTION: Grip strength: Right: 105 lbs; Left: 38 lbs  COORDINATION: Box and Blocks:  Right 9blocks, Left 50blocks  SENSATION: WFL    MUSCLE TONE: RUE: Moderate and Hypertonic  COGNITION: Overall cognitive status: Difficulty to assess due to: Communication impairment      OBSERVATIONS: Pleasant male with aphasia, accompannied by his mother [name]                                                                                                                             TREATMENT DATE: 06/05/24-Reveiwed HEP issued last visit 10-20 reps each  exercise min-mod v.c and facilitation.  chest  press with cane in seated while reaching  towards feet with bailateral UE's, min v.c A/ROM thumb flexion and extension with gentle passive stretch by therapist. Flipping playing cards with RUE, min-mod v.c improved performance   05/30/24-Pt/ mom were instructed in inital HEP. See pt instructions, min-mod v.c and demo. Therapist trialed an oval 8 splint on pt's right middle finger, it decreased pt's PIP hyperextension. Pt would benefit from a size 11. Pt to check at home to see what was previously issued for him. Therapist did not issue splint.    05/29/24 eval Table slides with bilateral UE's for shoulder flexion, supination/ pronation with passive stretch in supination mod v.c Flipping and dealing cards with RUE, LUE assisting with pulling card off deck, mod v.c facilitation        PATIENT EDUCATION: Education details: inital HEP review Person educated: Patient, mom Education method: Explanation, demonstration, v.c   Education comprehension: verbalized understanding, returned demonstration, v.c  HOME EXERCISE PROGRAM: n/a   GOALS: Goals reviewed with patient? Yes  SHORT TERM GOALS: Target date: 06/28/24  I with RUE positioning including splinting to maximize function and decrease risk for contracture  Goal status:  ongoing 06/05/24  2.  I with inital HEP  Goal status: ongoing 06/05/24  3.  Pt will demonstrate improve RUE functional use as evidenced by increasing 9 hole peg test to 12 blocks or greater.  Goal status: INITIAL  4.  Pt will consistently donn shirt mod I   Goal status: INITIAL  5.  Pt will demonstrate 40* shoulder flexion in prep for functional reach  Goal status: INITIAL 6. I with adapted strategies/ AE to increase safety and I with ADLs/IADLs.  Goal status: inital    LONG TERM GOALS: Target date: 08/21/24  I with updated HEP  Goal status: INITIAL  2.   Pt will demonstrate 50* shoulder flexion  in prep for functional reach   Goal status: INITIAL  3.  Pt will increase RUE grip strength to 45 lbs or greater for improved function.  Goal status: INITIAL  4.  Pt will bathe mod I consistently.   Goal status: INITIAL  5.  Pt will use RUE as a gross A at least 20% of the time for ADLs/IADLs.   Goal status: INITIAL    ASSESSMENT:  CLINICAL IMPRESSION: Patient is progressing towards goals. Pt demonstrates improving RUE functional use while flipping playing cards today.  SABRA   PERFORMANCE DEFICITS: in functional skills including ADLs, IADLs, coordination, dexterity, tone, ROM, strength, flexibility, Fine motor control, Gross motor control, mobility, balance, decreased knowledge of precautions, decreased knowledge of use of DME, and UE functional use,  and psychosocial skills including coping strategies, environmental adaptation, habits, interpersonal interactions, and routines and behaviors.   IMPAIRMENTS: are limiting patient from ADLs, IADLs, play, leisure, and social participation.   CO-MORBIDITIES: may have co-morbidities  that affects occupational performance. Patient will benefit from skilled OT to address above impairments and improve overall function.  MODIFICATION OR ASSISTANCE TO COMPLETE EVALUATION: No modification of tasks or assist necessary to complete an evaluation.  OT OCCUPATIONAL PROFILE AND HISTORY: Detailed assessment: Review of records and additional review of physical, cognitive, psychosocial history related to current functional performance.  CLINICAL DECISION MAKING: LOW - limited treatment options, no task modification necessary  REHAB POTENTIAL: Good  EVALUATION COMPLEXITY: Low    PLAN:  OT FREQUENCY: 1-2x/week  OT DURATION: 12 weeks  PLANNED INTERVENTIONS: 97168 OT Re-evaluation, 97535 self care/ADL training, 02889 therapeutic exercise, 97530 therapeutic activity,  02887 neuromuscular re-education, 97140 manual therapy, [id number] aquatic therapy,  97035 ultrasound, 02981 paraffin, 97010 moist heat, 97014 electrical stimulation unattended, 97760 Orthotic Initial, 97763 Orthotic/Prosthetic subsequent, passive range of motion, functional mobility training, energy conservation, coping strategies training, patient/family education, and DME and/or AE instructions  RECOMMENDED OTHER SERVICES: n/a  CONSULTED AND AGREED WITH PLAN OF CARE: Patient and family member/caregiver  PLAN FOR NEXT SESSION:RUE functional use, ADL strategies, further assess splinting needs   Jonnatan Hanners, OT 06/05/2024, 8:15 AM

## 2024-06-12 ENCOUNTER — Other Ambulatory Visit: Payer: Self-pay | Admitting: Family Medicine

## 2024-06-12 DIAGNOSIS — M109 Gout, unspecified: Secondary | ICD-10-CM

## 2024-06-14 ENCOUNTER — Other Ambulatory Visit: Payer: Self-pay | Admitting: Family Medicine

## 2024-06-14 ENCOUNTER — Ambulatory Visit: Payer: Medicare (Managed Care) | Admitting: Occupational Therapy

## 2024-06-14 DIAGNOSIS — M6281 Muscle weakness (generalized): Secondary | ICD-10-CM

## 2024-06-14 DIAGNOSIS — M25641 Stiffness of right hand, not elsewhere classified: Secondary | ICD-10-CM

## 2024-06-14 DIAGNOSIS — R29818 Other symptoms and signs involving the nervous system: Secondary | ICD-10-CM

## 2024-06-14 DIAGNOSIS — R278 Other lack of coordination: Secondary | ICD-10-CM

## 2024-06-14 DIAGNOSIS — I69351 Hemiplegia and hemiparesis following cerebral infarction affecting right dominant side: Secondary | ICD-10-CM

## 2024-06-14 NOTE — Therapy (Signed)
 OUTPATIENT OCCUPATIONAL THERAPY NEURO EVALUATION  Patient Name: Justin Mason MRN: [id number] DOB:[birth year], 43 y.o., male Today's Date: 06/14/2024  PCP: Billy Craze, NP REFERRING PROVIDER: Burnetta Brunet DO  END OF SESSION:  OT End of Session - 06/14/24 1703     Visit Number 4    Number of Visits 25    Date for Recertification  08/21/24    Authorization Type [employer] MCR    Authorization Time Period 12 weeks, 1-2 x week    Authorization - Visit Number 4    Progress Note Due on Visit 10    OT Start Time 1105    OT Stop Time 1145    OT Time Calculation (min) 40 min    Activity Tolerance Patient tolerated treatment well    Behavior During Therapy [facility] for tasks assessed/performed           Past Medical History:  Diagnosis Date   Acid reflux    AKI (acute kidney injury)    Arthritis    Cardiac murmur 05/24/2020   Cellulitis 01/03/2015   Cellulitis of right leg    Cellulitis of right lower extremity 01/03/2015   COVID    Depression    Diabetes (HCC)    Pre Diabetes   Essential hypertension    GERD (gastroesophageal reflux disease)    Gout    Gout    High cholesterol    Hyperlipemia    Hypertension    Morbid obesity (HCC) 05/24/2020   OSA (obstructive sleep apnea)    Rotator cuff tear, right 09/08/2011   Seizures (HCC)    Sepsis (HCC) 01/03/2015   Stroke (HCC)    Stroke (HCC)    Type 2 diabetes mellitus with hyperglycemia (HCC)    Past Surgical History:  Procedure Laterality Date   blood clot left side brain     Frontal   INCISION AND DRAINAGE FOOT  1991-1992   STEPPED ON NAIL / INFECTED WOUND   IR ANGIO INTRA EXTRACRAN SEL COM CAROTID INNOMINATE BILAT MOD SED  07/24/2020   IR ANGIO VERTEBRAL SEL SUBCLAVIAN INNOMINATE UNI L MOD SED  07/24/2020   IR ANGIO VERTEBRAL SEL VERTEBRAL UNI R MOD SED  07/24/2020   left foot surgery     age 61   RADIOLOGY WITH ANESTHESIA N/A 07/24/2020   Procedure: IR WITH ANESTHESIA - CODE STROKE;  Surgeon: Radiologist,  Medication, MD;  Location: [facility] OR;  Service: Radiology;  Laterality: N/A;   SHOULDER SURGERY     September 08, 2011   Patient Active Problem List   Diagnosis Date Noted   Depression 09/30/2022   Primary osteoarthritis of left knee 11/07/2020   Controlled type 2 diabetes mellitus with hyperglycemia, without long-term current use of insulin  (HCC) 09/06/2020   Controlled gout 09/06/2020   Chronic pain of left knee    Expressive aphasia    Chronic pain of right knee    Right elbow pain    Right hemiparesis (HCC)    Right hemiplegia (HCC)    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   Middle cerebral artery stenosis, 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   Morbid obesity (HCC) 05/24/2020   Cardiac murmur 05/24/2020   Arthritis    High cholesterol    GERD (gastroesophageal reflux disease)    Essential hypertension    Rotator cuff tear, right 09/08/2011    ONSET DATE: 05/02/24- referral date  REFERRING DIAG:  Diagnosis  G81.91 (ICD-10-CM) - Right hemiparesis (HCC)  M24.541 (ICD-10-CM) - Contracture of right hand    THERAPY DIAG:  Hemiplegia and hemiparesis following cerebral infarction affecting right dominant side (HCC)  Muscle weakness (generalized)  Other lack of coordination  Other symptoms and signs involving the nervous system  Stiffness of right hand, not elsewhere classified  Rationale for Evaluation and Treatment: Rehabilitation  SUBJECTIVE:   SUBJECTIVE STATEMENT: Denies pain Pt accompanied by: family member, mom [name]  PERTINENT HISTORY: Pt s/p MCA stroke back in 2022 which 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 No  FALLS: Has patient fallen in last 6 months? No  LIVING ENVIRONMENT: Lives with: lives with their family Lives in: House/apartment   PLOF: Needs  assistance with ADLs and Needs assistance with homemaking  PATIENT GOALS: improve RUE function, I with ADLs  OBJECTIVE:  Note: Objective measures were completed at Evaluation unless otherwise noted.  HAND DOMINANCE: Left, was R hand dominant prior to CVA, uses RUE only 10% of the time  ADLs: Overall ADLs: increase time using LUE Transfers/ambulation related to ADLs: Eating: uses LUE  UB Dressing: min A with shirt LB Dressing: mod I Toileting: mod I Bathing: min A-mod I for back, arms    IADLs: Pt mows the yard   MOBILITY STATUS: mod I with cane     UPPER EXTREMITY ROM:  LUE WFLS  Active ROM Right eval Left eval  Shoulder flexion 30* A/ 85* P   Shoulder abduction    Shoulder adduction    Shoulder extension    Shoulder internal rotation    Shoulder external rotation    Elbow flexion 110   Elbow extension -20   Wrist flexion 50   Wrist extension 70   Wrist ulnar deviation    Wrist radial deviation    Wrist pronation WFL   Wrist supination to neutral   (Blank rows = not tested)  RUE-Grossly 95% composite finger flexion, Pt with hyperextension at PIP joint middle finger with extension, 5th digit is flexed at PIP and hyperextended at MP, Pt maintains thumb in radial abduction however he is able to perform opposition to all digits with difficulty HAND FUNCTION: Grip strength: Right: 105 lbs; Left: 38 lbs  COORDINATION: Box and Blocks:  Right 9blocks, Left 50blocks  SENSATION: WFL    MUSCLE TONE: RUE: Moderate and Hypertonic  COGNITION: Overall cognitive status: Difficulty to assess due to: Communication impairment      OBSERVATIONS: Pleasant male with aphasia, accompannied by his mother [name]                                                                                                                             TREATMENT DATE: 06/14/24-Pt was fitted with a custom resting hand splint for improved positioning. Pt's mother returned demonstration of  application.  06/05/24-Reveiwed HEP issued last visit 10-20 reps each exercise  min-mod v.c and facilitation.  chest press with cane in seated while reaching  towards feet with bailateral UE's, min v.c A/ROM thumb flexion and extension with gentle passive stretch by therapist. Flipping playing cards with RUE, min-mod v.c improved performance   05/30/24-Pt/ mom were instructed in inital HEP. See pt instructions, min-mod v.c and demo. Therapist trialed an oval 8 splint on pt's right middle finger, it decreased pt's PIP hyperextension. Pt would benefit from a size 11. Pt to check at home to see what was previously issued for him. Therapist did not issue splint.    05/29/24 eval Table slides with bilateral UE's for shoulder flexion, supination/ pronation with passive stretch in supination mod v.c Flipping and dealing cards with RUE, LUE assisting with pulling card off deck, mod v.c facilitation        PATIENT EDUCATION: Education details:splint application, wear and care/ precuations, tableslides Person educated: Patient, mom Education method: Explanation, demonstration, v.c handout  Education comprehension: verbalized understanding, returned demonstration, v.c  HOME EXERCISE PROGRAM: 05/30/24- see pt instructions   GOALS: Goals reviewed with patient? Yes  SHORT TERM GOALS: Target date: 06/28/24  I with RUE positioning including splinting to maximize function and decrease risk for contracture  Goal status:  ongoing 06/14/24, resting hand splint issued  2.  I with inital HEP  Goal status: ongoing 06/14/24  3.  Pt will demonstrate improve RUE functional use as evidenced by increasing 9 hole peg test to 12 blocks or greater.  Goal status: INITIAL  4.  Pt will consistently donn shirt mod I   Goal status: INITIAL  5.  Pt will demonstrate 40* shoulder flexion in prep for functional reach  Goal status: INITIAL 6. I with adapted strategies/ AE to increase safety and I with  ADLs/IADLs.  Goal status: inital    LONG TERM GOALS: Target date: 08/21/24  I with updated HEP  Goal status: INITIAL  2.   Pt will demonstrate 50* shoulder flexion in prep for functional reach   Goal status: INITIAL  3.  Pt will increase RUE grip strength to 45 lbs or greater for improved function.  Goal status: INITIAL  4.  Pt will bathe mod I consistently.   Goal status: INITIAL  5.  Pt will use RUE as a gross A at least 20% of the time for ADLs/IADLs.   Goal status: INITIAL    ASSESSMENT:  CLINICAL IMPRESSION: Patient is progressing towards goals. Pt demonstrates improved postioning with resting hand splint. Pt's mother returned demonstration of application and she verbalizes unerstanding of precautions.  SABRA   PERFORMANCE DEFICITS: in functional skills including ADLs, IADLs, coordination, dexterity, tone, ROM, strength, flexibility, Fine motor control, Gross motor control, mobility, balance, decreased knowledge of precautions, decreased knowledge of use of DME, and UE functional use,  and psychosocial skills including coping strategies, environmental adaptation, habits, interpersonal interactions, and routines and behaviors.   IMPAIRMENTS: are limiting patient from ADLs, IADLs, play, leisure, and social participation.   CO-MORBIDITIES: may have co-morbidities  that affects occupational performance. Patient will benefit from skilled OT to address above impairments and improve overall function.  MODIFICATION OR ASSISTANCE TO COMPLETE EVALUATION: No modification of tasks or assist necessary to complete an evaluation.  OT OCCUPATIONAL PROFILE AND HISTORY: Detailed assessment: Review of records and additional review of physical, cognitive, psychosocial history related to current functional performance.  CLINICAL DECISION MAKING: LOW - limited treatment options, no task modification necessary  REHAB POTENTIAL: Good  EVALUATION COMPLEXITY: Low    PLAN:  OT FREQUENCY:  1-2x/week  OT DURATION: 12 weeks  PLANNED INTERVENTIONS: 97168 OT Re-evaluation, 97535 self care/ADL training, 02889 therapeutic exercise, 97530 therapeutic activity, 97112 neuromuscular re-education, 97140 manual therapy, 97113 aquatic therapy, 97035 ultrasound, 97018 paraffin, 02989 moist heat, 97014 electrical stimulation unattended, 97760 Orthotic Initial, 97763 Orthotic/Prosthetic subsequent, passive range of motion, functional mobility training, energy conservation, coping strategies training, patient/family education, and DME and/or AE instructions  RECOMMENDED OTHER SERVICES: n/a  CONSULTED AND AGREED WITH PLAN OF CARE: Patient and family member/caregiver  PLAN FOR NEXT SESSION: splint check   Vander Kueker, OT 06/14/2024, 5:04 PM

## 2024-06-14 NOTE — Patient Instructions (Signed)
 Your Splint This splint should initially be fitted by a healthcare practitioner.  The healthcare practitioner is responsible for providing wearing instructions and precautions to the patient, other healthcare practitioners and care provider involved in the patient's care.  This splint was custom made for you. Please read the following instructions to learn about wearing and caring for your splint.  Precautions Should your splint cause any of the following problems, remove the splint immediately and contact your therapist/physician. Swelling Severe Pain Pressure Areas Stiffness Numbness  Do not wear your splint while operating machinery unless it has been fabricated for that purpose.  When To Wear Your Splint Where your splint according to your therapist/physician instructions. Daytime for 1 hours, monitor skin,  if no problems wear 1-2 hours at a time while relaxing during the day.  Care and Cleaning of Your Splint Keep your splint away from open flames. Your splint will lose its shape in temperatures over 135 degrees Farenheit, ( in car windows, near radiators, ovens or in hot water).  Never make any adjustments to your splint, if the splint needs adjusting remove it and make an appointment to see your therapist. Your splint, including the cushion liner may be cleaned with soap and lukewarm water.  Do not immerse in hot water over 135 degrees Farenheit. Straps may be washed with soap and water, but do not moisten the self-adhesive portion.

## 2024-06-15 ENCOUNTER — Ambulatory Visit: Payer: Medicare (Managed Care) | Admitting: Sports Medicine

## 2024-06-15 ENCOUNTER — Encounter: Payer: Self-pay | Admitting: Sports Medicine

## 2024-06-15 ENCOUNTER — Ambulatory Visit: Payer: Medicare (Managed Care) | Admitting: Physician Assistant

## 2024-06-15 DIAGNOSIS — M25562 Pain in left knee: Secondary | ICD-10-CM | POA: Diagnosis not present

## 2024-06-15 DIAGNOSIS — M170 Bilateral primary osteoarthritis of knee: Secondary | ICD-10-CM

## 2024-06-15 DIAGNOSIS — G8191 Hemiplegia, unspecified affecting right dominant side: Secondary | ICD-10-CM | POA: Diagnosis not present

## 2024-06-15 DIAGNOSIS — G8929 Other chronic pain: Secondary | ICD-10-CM | POA: Diagnosis not present

## 2024-06-15 DIAGNOSIS — M25561 Pain in right knee: Secondary | ICD-10-CM

## 2024-06-15 DIAGNOSIS — M24541 Contracture, right hand: Secondary | ICD-10-CM

## 2024-06-15 MED ORDER — BUPIVACAINE HCL 0.25 % IJ SOLN
2.0000 mL | INTRAMUSCULAR | Status: AC | PRN
  Administered 2024-06-15: 2 mL via INTRA_ARTICULAR

## 2024-06-15 MED ORDER — SODIUM HYALURONATE 60 MG/3ML IX PRSY
60.0000 mg | PREFILLED_SYRINGE | INTRA_ARTICULAR | Status: AC | PRN
  Administered 2024-06-15: 60 mg via INTRA_ARTICULAR

## 2024-06-15 MED ORDER — LIDOCAINE HCL 1 % IJ SOLN
2.0000 mL | INTRAMUSCULAR | Status: AC | PRN
  Administered 2024-06-15: 2 mL

## 2024-06-15 NOTE — Progress Notes (Signed)
 Patient says that he got great initial relief from the injections, and although it has worn off some, his knees still feel better now than they did prior to the injections. He is here for bilateral gel injections today.

## 2024-06-15 NOTE — Progress Notes (Signed)
 Justin Mason - 43 y.o. male MRN [id number]  Date of birth: December 23, 1980  Office Visit Note: Visit Date: 06/15/2024 PCP: Billy Philippe SAUNDERS, NP Referred by: Billy Philippe SAUNDERS, NP  Subjective: Chief Complaint  Patient presents with   Right Knee - Pain   Left Knee - Pain   HPI: Justin Mason is a pleasant 43 y.o. male who presents today for follow-up of bilateral knee OA and gait abnormality s/p CVA. Also here for visco injections b/l.  Knees -overall doing improved.  He did respond well to corticosteroid injection back in October, although he recently has noticed his benefit has waned in efficacy.  Does notice crepitus.  He is working through some balance and gait with OT/PT.  Hemiparesis/Hand contracture -this is acute on chronic after a significant CVA in 2022.  He recently has been involved in OT over at the neurorehab, finding benefit from this.  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  He continues on baclofen  10 mg 3 times daily for his contractures.  Pertinent ROS were reviewed with the patient and found to be negative unless otherwise specified above in HPI.   Assessment & Plan: Visit Diagnoses:  1. Bilateral primary osteoarthritis of knee   2. Chronic pain of both knees   3. Contracture of right hand   4. Right hemiparesis (HCC)    Plan: Impression is acute on chronic advanced left > right osteoarthritic change of bilateral knees, also with right hemiparesis from his MCA stroke in 2022 with associated hand contracture.  For his knee OA and patellofemoral crepitus, we did proceed with bilateral Durling injection for the right and left knee, patient tolerated well.  He will continue PT/OT with this and gait adjustments.  He is making mild improvements in OT with his hand contracture and right hemiparesis, he will continue this through January.  Will continue  with his splint for the pseudo boutonniere deformity.  Did discuss with [place] and his mother that is unlikely he will have significant improvement given the chronicity of his stroke but I do think he is making minor improvements which is promising.  He will continue his baclofen  10 mg 3 times daily to help with his contracture pain and muscle spasming.  He may use ice/heat, Tylenol  or Voltaren  gel for his knees.  He will let me know how he does over the next few months and will follow-up as needed.  Follow-up: Return if symptoms worsen or fail to improve.   Meds & Orders: No orders of the defined types were placed in this encounter.   Orders Placed This Encounter  Procedures   Large Joint Inj   Large Joint Inj     Procedures: Large Joint Inj: R knee on 06/15/2024 1:34 PM Indications: pain Details: 22 G 1.5 in needle, anterolateral approach Medications: 2 mL lidocaine  1 %; 2 mL bupivacaine  0.25 %; 60 mg Sodium Hyaluronate 60 MG/3ML Outcome: tolerated well, no immediate complications  *Procedurally successful right knee viscosupplementation injection, Durolane Procedure, treatment alternatives, risks and benefits explained, specific risks discussed. Consent was given by the patient. Patient was prepped and draped in the usual sterile fashion.    Large Joint Inj: L knee on 06/15/2024 1:36 PM Indications: pain Details: 22 G 1.5 in needle, anterolateral approach Medications: 2 mL lidocaine  1 %; 2 mL bupivacaine  0.25 %; 60 mg Sodium Hyaluronate 60 MG/3ML  Outcome: tolerated well, no immediate complications  *Procedurally successful left knee viscosupplementation injection, Durolane Procedure, treatment alternatives, risks and benefits explained, specific risks discussed. Consent was given by the patient. Patient was prepped and draped in the usual sterile fashion.          Clinical History: No specialty comments available.  He reports that he has never smoked. His smokeless tobacco use  includes chew.  Recent Labs    10/04/23 1104 04/20/24 1223  HGBA1C 6.4 7.3*    Objective:    Physical Exam  Gen: Well-appearing, in no acute distress; non-toxic CV: Well-perfused. Warm.  Resp: Breathing unlabored on room air; no wheezing. Psych: Fluid speech in conversation; appropriate affect; normal thought process  Ortho Exam - Bilateral knees: No redness swelling or effusion.  There is rather significant patellofemoral crepitus noted bilaterally. Range of motion of the right knee 0-125 degrees, range of motion of the left knee from about 3-120 degrees.   Imaging:  1028/25: 4 view x-ray of the right knee including standing AP, Rosenberg, lateral  and sunrise view were ordered and reviewed by myself today.  X-rays  demonstrate at least moderate tricompartmental arthritic change with  osteophytosis and joint space narrowing most notable in the medial  tibiofemoral and patellofemoral joint space.  No acute fracture noted.   05/02/24: Complete x-ray views of the left knee including AP standing, Rosenberg,  lateral and sunrise views were ordered and reviewed by myself.  These were  compared to x-rays from 2022 which redemonstrate severe osteoarthritis  that is tricompartmental in nature.  There is significant osteophytosis,  subchondral sclerosis most in the patellofemoral joint as well as the  lateral > medial tibiofemoral joint space.  No acute fracture noted.   Past Medical/Family/Surgical/Social History: Medications & Allergies reviewed per EMR, new medications updated. 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   Middle cerebral artery stenosis, 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   Morbid obesity (HCC) 05/24/2020   Cardiac murmur 05/24/2020   Arthritis    High cholesterol    GERD (gastroesophageal reflux disease)    Essential hypertension    Rotator cuff tear, right 09/08/2011   Past Medical History:  Diagnosis Date   Acid reflux    AKI (acute kidney injury)    Arthritis    Cardiac murmur 05/24/2020   Cellulitis 01/03/2015   Cellulitis of right leg    Cellulitis of right lower extremity 01/03/2015   COVID    Depression    Diabetes (HCC)    Pre Diabetes   Essential hypertension    GERD (gastroesophageal reflux disease)    Gout    Gout    High cholesterol    Hyperlipemia    Hypertension    Morbid obesity (HCC) 05/24/2020   OSA (obstructive sleep apnea)    Rotator cuff tear, right 09/08/2011   Seizures (HCC)    Sepsis (HCC) 01/03/2015   Stroke (HCC)    Stroke (HCC)    Type 2 diabetes mellitus with hyperglycemia (HCC)    Family History  Problem Relation Age of Onset   Hyperlipidemia Mother    Vision loss Mother    Asthma Mother    Arthritis Mother    Heart disease Mother    COPD Mother    Heart attack Mother    Gout Father    Anesthesia problems Sister    Colon cancer Maternal Grandfather    Stroke Maternal Grandfather    Prostate cancer Other    Colon cancer Other    Past Surgical History:  Procedure Laterality Date   blood clot left side brain     Frontal   INCISION AND DRAINAGE FOOT  1991-1992   STEPPED ON NAIL / INFECTED WOUND   IR ANGIO INTRA EXTRACRAN SEL COM CAROTID INNOMINATE BILAT MOD SED  07/24/2020   IR ANGIO VERTEBRAL SEL SUBCLAVIAN INNOMINATE UNI L MOD SED  07/24/2020   IR ANGIO VERTEBRAL SEL VERTEBRAL UNI R MOD SED  07/24/2020   left foot surgery     age 76   RADIOLOGY WITH ANESTHESIA N/A 07/24/2020   Procedure: IR WITH ANESTHESIA - CODE STROKE;  Surgeon: Radiologist, Medication, MD;  Location: [facility] OR;  Service:  Radiology;  Laterality: N/A;   SHOULDER SURGERY     September 08, 2011   Social History   Occupational History   Not on file  Tobacco Use   Smoking status: Never   Smokeless tobacco: Current    Types: Chew  Vaping Use   Vaping status: Never Used  Substance and Sexual Activity   Alcohol use: Not Currently    Comment: 1x week   Drug use: No   Sexual activity: Not Currently

## 2024-06-20 ENCOUNTER — Ambulatory Visit: Payer: Medicare (Managed Care) | Admitting: Occupational Therapy

## 2024-06-20 DIAGNOSIS — I69351 Hemiplegia and hemiparesis following cerebral infarction affecting right dominant side: Secondary | ICD-10-CM | POA: Diagnosis not present

## 2024-06-20 DIAGNOSIS — M25641 Stiffness of right hand, not elsewhere classified: Secondary | ICD-10-CM

## 2024-06-20 DIAGNOSIS — M6281 Muscle weakness (generalized): Secondary | ICD-10-CM

## 2024-06-20 DIAGNOSIS — R278 Other lack of coordination: Secondary | ICD-10-CM

## 2024-06-20 DIAGNOSIS — R29818 Other symptoms and signs involving the nervous system: Secondary | ICD-10-CM

## 2024-06-20 NOTE — Therapy (Unsigned)
 OUTPATIENT OCCUPATIONAL THERAPY NEURO Treatment  Patient Name: Justin Mason MRN: [id number] DOB:[birth year], 43 y.o., male Today's Date: 06/20/2024  PCP: Billy Craze, NP REFERRING PROVIDER: Burnetta Brunet DO  END OF SESSION:  OT End of Session - 06/20/24 1256     Visit Number 5    Number of Visits 25    Date for Recertification  08/21/24    Authorization Type [employer] MCR    Authorization Time Period 12 weeks, 1-2 x week    Authorization - Visit Number 5    Progress Note Due on Visit 10    OT Start Time 1232    OT Stop Time 1312    OT Time Calculation (min) 40 min    Activity Tolerance Patient tolerated treatment well    Behavior During Therapy [facility] for tasks assessed/performed           Past Medical History:  Diagnosis Date   Acid reflux    AKI (acute kidney injury)    Arthritis    Cardiac murmur 05/24/2020   Cellulitis 01/03/2015   Cellulitis of right leg    Cellulitis of right lower extremity 01/03/2015   COVID    Depression    Diabetes (HCC)    Pre Diabetes   Essential hypertension    GERD (gastroesophageal reflux disease)    Gout    Gout    High cholesterol    Hyperlipemia    Hypertension    Morbid obesity (HCC) 05/24/2020   OSA (obstructive sleep apnea)    Rotator cuff tear, right 09/08/2011   Seizures (HCC)    Sepsis (HCC) 01/03/2015   Stroke (HCC)    Stroke (HCC)    Type 2 diabetes mellitus with hyperglycemia (HCC)    Past Surgical History:  Procedure Laterality Date   blood clot left side brain     Frontal   INCISION AND DRAINAGE FOOT  1991-1992   STEPPED ON NAIL / INFECTED WOUND   IR ANGIO INTRA EXTRACRAN SEL COM CAROTID INNOMINATE BILAT MOD SED  07/24/2020   IR ANGIO VERTEBRAL SEL SUBCLAVIAN INNOMINATE UNI L MOD SED  07/24/2020   IR ANGIO VERTEBRAL SEL VERTEBRAL UNI R MOD SED  07/24/2020   left foot surgery     age 69   RADIOLOGY WITH ANESTHESIA N/A 07/24/2020   Procedure: IR WITH ANESTHESIA - CODE STROKE;  Surgeon: Radiologist,  Medication, MD;  Location: [facility] OR;  Service: Radiology;  Laterality: N/A;   SHOULDER SURGERY     September 08, 2011   Patient Active Problem List   Diagnosis Date Noted   Depression 09/30/2022   Primary osteoarthritis of left knee 11/07/2020   Controlled type 2 diabetes mellitus with hyperglycemia, without long-term current use of insulin  (HCC) 09/06/2020   Controlled gout 09/06/2020   Chronic pain of left knee    Expressive aphasia    Chronic pain of right knee    Right elbow pain    Right hemiparesis (HCC)    Right hemiplegia (HCC)    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   Middle cerebral artery stenosis, 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   Morbid obesity (HCC) 05/24/2020   Cardiac murmur 05/24/2020   Arthritis    High cholesterol    GERD (gastroesophageal reflux disease)    Essential hypertension    Rotator cuff tear, right 09/08/2011    ONSET DATE: 05/02/24- referral date  REFERRING DIAG:  Diagnosis  G81.91 (ICD-10-CM) - Right hemiparesis (HCC)  M24.541 (ICD-10-CM) - Contracture of right hand    THERAPY DIAG:  Hemiplegia and hemiparesis following cerebral infarction affecting right dominant side (HCC)  Muscle weakness (generalized)  Other lack of coordination  Other symptoms and signs involving the nervous system  Stiffness of right hand, not elsewhere classified  Rationale for Evaluation and Treatment: Rehabilitation  SUBJECTIVE:   SUBJECTIVE STATEMENT: Denies pain Pt accompanied by: family member, mom [name]  PERTINENT HISTORY: Pt s/p MCA stroke back in 2022 which 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 No  FALLS: Has patient fallen in last 6 months? No  LIVING ENVIRONMENT: Lives with: lives with their family Lives in: House/apartment   PLOF: Needs  assistance with ADLs and Needs assistance with homemaking  PATIENT GOALS: improve RUE function, I with ADLs  OBJECTIVE:  Note: Objective measures were completed at Evaluation unless otherwise noted.  HAND DOMINANCE: Left, was R hand dominant prior to CVA, uses RUE only 10% of the time  ADLs: Overall ADLs: increase time using LUE Transfers/ambulation related to ADLs: Eating: uses LUE  UB Dressing: min A with shirt LB Dressing: mod I Toileting: mod I Bathing: min A-mod I for back, arms    IADLs: Pt mows the yard   MOBILITY STATUS: mod I with cane     UPPER EXTREMITY ROM:  LUE WFLS  Active ROM Right eval Left eval  Shoulder flexion 30* A/ 85* P   Shoulder abduction    Shoulder adduction    Shoulder extension    Shoulder internal rotation    Shoulder external rotation    Elbow flexion 110   Elbow extension -20   Wrist flexion 50   Wrist extension 70   Wrist ulnar deviation    Wrist radial deviation    Wrist pronation WFL   Wrist supination to neutral   (Blank rows = not tested)  RUE-Grossly 95% composite finger flexion, Pt with hyperextension at PIP joint middle finger with extension, 5th digit is flexed at PIP and hyperextended at MP, Pt maintains thumb in radial abduction however he is able to perform opposition to all digits with difficulty HAND FUNCTION: Grip strength: Right: 105 lbs; Left: 38 lbs  COORDINATION: Box and Blocks:  Right 9blocks, Left 50blocks  SENSATION: WFL    MUSCLE TONE: RUE: Moderate and Hypertonic  COGNITION: Overall cognitive status: Difficulty to assess due to: Communication impairment      OBSERVATIONS: Pleasant male with aphasia, accompannied by his mother [name]                                                                                                                             TREATMENT DATE: 06/20/24- Splint check perfromed for resting hand splint. Therapist applied splint and discussed with pt and mom.  Splint   fits well and pt has been wearing for  1.5 hours at a time without issues. Pt arrived wearing his size 11 oval 8 splint . Splint fits well without issues. Pt was instructed to wear during daytime only. Buddy loop applied to 4th and 5th digits for improved 5th digit positioning. Flipping playing cards with RUE, min v.c and difficulty, cueing to place arm on table. Picking up checkers with RUE for increased fine motor coordination to place into container, min difficulty/ v.c    06/14/24-Pt was fitted with a custom resting hand splint for improved positioning. Pt's mother returned demonstration of application.  06/05/24-Reveiwed HEP issued last visit 10-20 reps each exercise min-mod v.c and facilitation.  chest press with cane in seated while reaching  towards feet with bailateral UE's, min v.c A/ROM thumb flexion and extension with gentle passive stretch by therapist. Flipping playing cards with RUE, min-mod v.c improved performance   05/30/24-Pt/ mom were instructed in inital HEP. See pt instructions, min-mod v.c and demo. Therapist trialed an oval 8 splint on pt's right middle finger, it decreased pt's PIP hyperextension. Pt would benefit from a size 11. Pt to check at home to see what was previously issued for him. Therapist did not issue splint.    05/29/24 eval Table slides with bilateral UE's for shoulder flexion, supination/ pronation with passive stretch in supination mod v.c Flipping and dealing cards with RUE, LUE assisting with pulling card off deck, mod v.c facilitation        PATIENT EDUCATION: Education details:splint wear and care/ precuations,  Person educated: Patient, mom Education method: Explanation, demonstration, v.c handout  Education comprehension: verbalized understanding, returned demonstration, v.c  HOME EXERCISE PROGRAM: 05/30/24- see pt instructions   GOALS: Goals reviewed with patient? Yes  SHORT TERM GOALS: Target date: 06/28/24  I with RUE  positioning including splinting to maximize function and decrease risk for contracture  Goal status:  met for resting hand splint and oval 8, 06/20/24,   2.  I with inital HEP  Goal status: ongoing 06/14/24  3.  Pt will demonstrate improve RUE functional use as evidenced by increasing 9 hole peg test to 12 blocks or greater.  Goal status: ongoing 06/20/24  4.  Pt will consistently donn shirt mod I   Goal status: ongoing 06/20/24  5.  Pt will demonstrate 40* shoulder flexion in prep for functional reach  Goal status: ongoing 06/20/24 6. I with adapted strategies/ AE to increase safety and I with ADLs/IADLs.  Goal status: ongoing 06/20/24   LONG TERM GOALS: Target date: 08/21/24  I with updated HEP  Goal status: INITIAL  2.   Pt will demonstrate 50* shoulder flexion in prep for functional reach   Goal status: INITIAL  3.  Pt will increase RUE grip strength to 45 lbs or greater for improved function.  Goal status: INITIAL  4.  Pt will bathe mod I consistently.   Goal status: INITIAL  5.  Pt will use RUE as a gross A at least 20% of the time for ADLs/IADLs.   Goal status: INITIAL    ASSESSMENT:  CLINICAL IMPRESSION: Patient is progressing towards goals. Pt's resting hand splint fits well and provides improving RUE positioning. Pt demonstrates improving functional use of RUE. SABRA   PERFORMANCE DEFICITS: in functional skills including ADLs, IADLs, coordination, dexterity, tone, ROM, strength, flexibility, Fine motor control, Gross motor control, mobility, balance, decreased knowledge of precautions, decreased knowledge of use of DME, and UE functional use,  and psychosocial skills including coping strategies, environmental adaptation, habits, interpersonal interactions, and  routines and behaviors.   IMPAIRMENTS: are limiting patient from ADLs, IADLs, play, leisure, and social participation.   CO-MORBIDITIES: may have co-morbidities  that affects occupational  performance. Patient will benefit from skilled OT to address above impairments and improve overall function.  MODIFICATION OR ASSISTANCE TO COMPLETE EVALUATION: No modification of tasks or assist necessary to complete an evaluation.  OT OCCUPATIONAL PROFILE AND HISTORY: Detailed assessment: Review of records and additional review of physical, cognitive, psychosocial history related to current functional performance.  CLINICAL DECISION MAKING: LOW - limited treatment options, no task modification necessary  REHAB POTENTIAL: Good  EVALUATION COMPLEXITY: Low    PLAN:  OT FREQUENCY: 1-2x/week  OT DURATION: 12 weeks  PLANNED INTERVENTIONS: 97168 OT Re-evaluation, 97535 self care/ADL training, 02889 therapeutic exercise, 97530 therapeutic activity, 97112 neuromuscular re-education, 97140 manual therapy, 97113 aquatic therapy, 97035 ultrasound, 97018 paraffin, 02989 moist heat, 97014 electrical stimulation unattended, 97760 Orthotic Initial, 97763 Orthotic/Prosthetic subsequent, passive range of motion, functional mobility training, energy conservation, coping strategies training, patient/family education, and DME and/or AE instructions  RECOMMENDED OTHER SERVICES: n/a  CONSULTED AND AGREED WITH PLAN OF CARE: Patient and family member/caregiver  PLAN FOR NEXT SESSION: UB dressing, NMR, RUE functional use   Katriana Dortch, OT 06/20/2024, 1:10 PM

## 2024-06-20 NOTE — Patient Instructions (Signed)
 AROM: PIP Flexion / Extension    Pinch bottom knuckle of ______small__ finger of right hand to prevent bending. Actively bend middle knuckle until stretch is felt. Hold __5__ seconds. Relax. Straighten finger as far as possible. Repeat _10___ times per set. Do __1__ sets per session. Do __1-2__ sessions per day.  Copyright  VHI. All rights reserved.   Practice picking up checkers with right hand  Wear buddy loop on pinky and ring finger, during daytime, remove if pain or pressure areas, or bathing   remove to wear resting hand splint   Wear oval 8 splint during daytime

## 2024-06-21 ENCOUNTER — Encounter: Payer: Self-pay | Admitting: Occupational Therapy

## 2024-07-04 ENCOUNTER — Ambulatory Visit: Payer: Medicare (Managed Care) | Admitting: Occupational Therapy

## 2024-07-10 ENCOUNTER — Ambulatory Visit: Payer: Medicare (Managed Care) | Attending: Sports Medicine | Admitting: Occupational Therapy

## 2024-07-10 DIAGNOSIS — M25641 Stiffness of right hand, not elsewhere classified: Secondary | ICD-10-CM | POA: Diagnosis present

## 2024-07-10 DIAGNOSIS — I69351 Hemiplegia and hemiparesis following cerebral infarction affecting right dominant side: Secondary | ICD-10-CM | POA: Diagnosis present

## 2024-07-10 DIAGNOSIS — R29818 Other symptoms and signs involving the nervous system: Secondary | ICD-10-CM | POA: Insufficient documentation

## 2024-07-10 DIAGNOSIS — M6281 Muscle weakness (generalized): Secondary | ICD-10-CM | POA: Insufficient documentation

## 2024-07-10 DIAGNOSIS — R278 Other lack of coordination: Secondary | ICD-10-CM | POA: Insufficient documentation

## 2024-07-10 NOTE — Patient Instructions (Signed)
 1. Grip Strengthening (Resistive Putty)   Squeeze putty using thumb and all fingers. Repeat _20___ times. Do __1-2__ sessions per day.        Copyright  VHI. All rights reserved.

## 2024-07-10 NOTE — Therapy (Unsigned)
 " OUTPATIENT OCCUPATIONAL THERAPY NEURO Treatment  Patient Name: Justin Mason MRN: [id number] DOB:Nov 15, 1980, 44 y.o., male Today's Date: 07/12/2024  PCP: Billy Craze, NP REFERRING PROVIDER: Burnetta Brunet DO  END OF SESSION:  OT End of Session - 07/12/24 1226     Visit Number 6    Number of Visits 25    Date for Recertification  08/21/24    Authorization Type [employer] MCR    Authorization Time Period 12 weeks, 1-2 x week    Authorization - Visit Number 6    Progress Note Due on Visit 10    OT Start Time 1326    OT Stop Time 1413    OT Time Calculation (min) 47 min            Past Medical History:  Diagnosis Date   Acid reflux    AKI (acute kidney injury)    Arthritis    Cardiac murmur 05/24/2020   Cellulitis 01/03/2015   Cellulitis of right leg    Cellulitis of right lower extremity 01/03/2015   COVID    Depression    Diabetes (HCC)    Pre Diabetes   Essential hypertension    GERD (gastroesophageal reflux disease)    Gout    Gout    High cholesterol    Hyperlipemia    Hypertension    Morbid obesity (HCC) 05/24/2020   OSA (obstructive sleep apnea)    Rotator cuff tear, right 09/08/2011   Seizures (HCC)    Sepsis (HCC) 01/03/2015   Stroke (HCC)    Stroke (HCC)    Type 2 diabetes mellitus with hyperglycemia (HCC)    Past Surgical History:  Procedure Laterality Date   blood clot left side brain     Frontal   INCISION AND DRAINAGE FOOT  1991-1992   STEPPED ON NAIL / INFECTED WOUND   IR ANGIO INTRA EXTRACRAN SEL COM CAROTID INNOMINATE BILAT MOD SED  07/24/2020   IR ANGIO VERTEBRAL SEL SUBCLAVIAN INNOMINATE UNI L MOD SED  07/24/2020   IR ANGIO VERTEBRAL SEL VERTEBRAL UNI R MOD SED  07/24/2020   left foot surgery     age 35   RADIOLOGY WITH ANESTHESIA N/A 07/24/2020   Procedure: IR WITH ANESTHESIA - CODE STROKE;  Surgeon: Radiologist, Medication, MD;  Location: [facility] OR;  Service: Radiology;  Laterality: N/A;   SHOULDER SURGERY     September 08, 2011    Patient Active Problem List   Diagnosis Date Noted   Depression 09/30/2022   Primary osteoarthritis of left knee 11/07/2020   Controlled type 2 diabetes mellitus with hyperglycemia, without long-term current use of insulin  (HCC) 09/06/2020   Controlled gout 09/06/2020   Chronic pain of left knee    Expressive aphasia    Chronic pain of right knee    Right elbow pain    Right hemiparesis (HCC)    Right hemiplegia (HCC)    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   Middle cerebral artery stenosis, 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   Morbid obesity (HCC) 05/24/2020   Cardiac murmur 05/24/2020   Arthritis    High cholesterol    GERD (gastroesophageal reflux disease)    Essential hypertension    Rotator cuff tear, right 09/08/2011    ONSET DATE: 05/02/24- referral date  REFERRING DIAG:  Diagnosis  G81.91 (ICD-10-CM) - Right hemiparesis (HCC)  M24.541 (ICD-10-CM) - Contracture of right hand    THERAPY DIAG:  Hemiplegia and hemiparesis following cerebral infarction affecting right dominant side (HCC)  Muscle weakness (generalized)  Other lack of coordination  Other symptoms and signs involving the nervous system  Stiffness of right hand, not elsewhere classified  Rationale for Evaluation and Treatment: Rehabilitation  SUBJECTIVE:   SUBJECTIVE STATEMENT: Denies pain Pt accompanied by: family member, mom [name]  PERTINENT HISTORY: Pt s/p MCA stroke back in 2022 which 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 No  FALLS: Has patient fallen in last 6 months? No  LIVING ENVIRONMENT: Lives with: lives with their family Lives in: House/apartment   PLOF: Needs assistance with ADLs and Needs assistance with homemaking  PATIENT GOALS: improve RUE function, I with  ADLs  OBJECTIVE:  Note: Objective measures were completed at Evaluation unless otherwise noted.  HAND DOMINANCE: Left, was R hand dominant prior to CVA, uses RUE only 10% of the time  ADLs: Overall ADLs: increase time using LUE Transfers/ambulation related to ADLs: Eating: uses LUE  UB Dressing: min A with shirt LB Dressing: mod I Toileting: mod I Bathing: min A-mod I for back, arms    IADLs: Pt mows the yard   MOBILITY STATUS: mod I with cane     UPPER EXTREMITY ROM:  LUE WFLS  Active ROM Right eval Left eval  Shoulder flexion 30* A/ 85* P   Shoulder abduction    Shoulder adduction    Shoulder extension    Shoulder internal rotation    Shoulder external rotation    Elbow flexion 110   Elbow extension -20   Wrist flexion 50   Wrist extension 70   Wrist ulnar deviation    Wrist radial deviation    Wrist pronation WFL   Wrist supination to neutral   (Blank rows = not tested)  RUE-Grossly 95% composite finger flexion, Pt with hyperextension at PIP joint middle finger with extension, 5th digit is flexed at PIP and hyperextended at MP, Pt maintains thumb in radial abduction however he is able to perform opposition to all digits with difficulty HAND FUNCTION: Grip strength: Right: 105 lbs; Left: 38 lbs  COORDINATION: Box and Blocks:  Right 9blocks, Left 50blocks  SENSATION: WFL    MUSCLE TONE: RUE: Moderate and Hypertonic  COGNITION: Overall cognitive status: Difficulty to assess due to: Communication impairment      OBSERVATIONS: Pleasant male with aphasia, accompannied by his mother [name]                                                                                                                             TREATMENT DATE:  07/10/24-Therapist reviewed previously issued exercises from HEP. Supine cane exercises for shoulder flexion and chest press, min facilitation/ v.c Seated holding ball between palms to reach towards floor then bring back to  lap min v.c  Standing to pick up cones with RUE,  min difficulty, min v.c Seated shoulder flexion and elbow extension with vertical cane using RUE min v.c. Flipping cards with RUE min v.c to avoid compensation, min difficulty Standing to pick up cones with RUE, min difficulty, min v.c Yellow putty for sustained grip, min-mod v.c Therapist discussed plans for d/c next visit and started checking progress towards goals.    06/20/24- Splint check performed for resting hand splint. Therapist applied splint and discussed with pt and mom.  Splint  fits well and pt has been wearing for 1.5 hours at a time without issues. Pt arrived wearing his size 11 oval 8 splint . Splint fits well without issues. Pt was instructed to wear during daytime only. Buddy loop applied to 4th and 5th digits for improved 5th digit positioning. Flipping playing cards with RUE, min v.c and difficulty, cueing to place arm on table. Picking up checkers with RUE for increased fine motor coordination to place into container, min difficulty/ v.c    06/14/24-Pt was fitted with a custom resting hand splint for improved positioning. Pt's mother returned demonstration of application.  06/05/24-Reveiwed HEP issued last visit 10-20 reps each exercise min-mod v.c and facilitation.  chest press with cane in seated while reaching  towards feet with bailateral UE's, min v.c A/ROM thumb flexion and extension with gentle passive stretch by therapist. Flipping playing cards with RUE, min-mod v.c improved performance   05/30/24-Pt/ mom were instructed in inital HEP. See pt instructions, min-mod v.c and demo. Therapist trialed an oval 8 splint on pt's right middle finger, it decreased pt's PIP hyperextension. Pt would benefit from a size 11. Pt to check at home to see what was previously issued for him. Therapist did not issue splint.    05/29/24 eval Table slides with bilateral UE's for shoulder flexion, supination/ pronation with  passive stretch in supination mod v.c Flipping and dealing cards with RUE, LUE assisting with pulling card off deck, mod v.c facilitation        PATIENT EDUCATION: Education details: plans for d/c next visit , yellow putty for sustained grip, min v.c Person educated: Patient, mom Education method: Explanation, demonstration, v.c   Education comprehension: verbalized understanding, returned demonstration, v.c  HOME EXERCISE PROGRAM: 05/30/24- see pt instructions   GOALS: Goals reviewed with patient? Yes  SHORT TERM GOALS: Target date: 06/28/24  I with RUE positioning including splinting to maximize function and decrease risk for contracture  Goal status:  met for resting hand splint and oval 8, 06/20/24,   2.  I with inital HEP  Goal status: met, 07/10/24  3.  Pt will demonstrate improve RUE functional use as evidenced by increasing box/ blocks test to 12 blocks or greater.- (corrected to box/blocks)  Goal status: met 16 blocks 07/10/24  4.  Pt will consistently donn shirt mod I   Goal status: ongoing 06/20/24  5.  Pt will demonstrate 40* shoulder flexion in prep for functional reach  Goal status:  6. I with adapted strategies/ AE to increase safety and I with ADLs/IADLs.  Goal status: ongoing 06/20/24   LONG TERM GOALS: Target date: 08/21/24  I with updated HEP  Goal status: INITIAL  2.   Pt will demonstrate 50* shoulder flexion in prep for functional reach   Goal status: met, 50 with compensation 07/10/24  3.  Pt will increase RUE grip strength to 45 lbs or greater for improved function.  Goal status: ongoing 07/10/24  4.  Pt will bathe mod I consistently.   Goal status: INITIAL  5.  Pt will use RUE as a gross A at least 20% of the time for ADLs/IADLs.   Goal status: uses 20-25% of the time, 07/10/24    ASSESSMENT:  CLINICAL IMPRESSION: Patient is progressing towards goals. He achieved long term goals# 2, 5. Pt/ mom agree with plans for d/c next  visit. PERFORMANCE DEFICITS: in functional skills including ADLs, IADLs, coordination, dexterity, tone, ROM, strength, flexibility, Fine motor control, Gross motor control, mobility, balance, decreased knowledge of precautions, decreased knowledge of use of DME, and UE functional use,  and psychosocial skills including coping strategies, environmental adaptation, habits, interpersonal interactions, and routines and behaviors.   IMPAIRMENTS: are limiting patient from ADLs, IADLs, play, leisure, and social participation.   CO-MORBIDITIES: may have co-morbidities  that affects occupational performance. Patient will benefit from skilled OT to address above impairments and improve overall function.  MODIFICATION OR ASSISTANCE TO COMPLETE EVALUATION: No modification of tasks or assist necessary to complete an evaluation.  OT OCCUPATIONAL PROFILE AND HISTORY: Detailed assessment: Review of records and additional review of physical, cognitive, psychosocial history related to current functional performance.  CLINICAL DECISION MAKING: LOW - limited treatment options, no task modification necessary  REHAB POTENTIAL: Good  EVALUATION COMPLEXITY: Low    PLAN:  OT FREQUENCY: 1-2x/week  OT DURATION: 12 weeks  PLANNED INTERVENTIONS: 97168 OT Re-evaluation, 97535 self care/ADL training, 02889 therapeutic exercise, 97530 therapeutic activity, 97112 neuromuscular re-education, 97140 manual therapy, 97113 aquatic therapy, 97035 ultrasound, 97018 paraffin, 02989 moist heat, 97014 electrical stimulation unattended, 97760 Orthotic Initial, 97763 Orthotic/Prosthetic subsequent, passive range of motion, functional mobility training, energy conservation, coping strategies training, patient/family education, and DME and/or AE instructions  RECOMMENDED OTHER SERVICES: n/a  CONSULTED AND AGREED WITH PLAN OF CARE: Patient and family member/caregiver  PLAN FOR NEXT SESSION:  check goals, d/c   Sheridan Hew,  OT 07/12/2024, 12:27 PM           "

## 2024-07-12 ENCOUNTER — Encounter: Payer: Self-pay | Admitting: Occupational Therapy

## 2024-07-13 ENCOUNTER — Other Ambulatory Visit: Payer: Medicare (Managed Care)

## 2024-07-13 ENCOUNTER — Encounter: Payer: Self-pay | Admitting: Cardiology

## 2024-07-17 ENCOUNTER — Ambulatory Visit: Payer: Medicare (Managed Care) | Admitting: Occupational Therapy

## 2024-07-18 ENCOUNTER — Ambulatory Visit: Payer: Self-pay | Admitting: Family Medicine

## 2024-07-18 ENCOUNTER — Other Ambulatory Visit (INDEPENDENT_AMBULATORY_CARE_PROVIDER_SITE_OTHER): Payer: Medicare (Managed Care)

## 2024-07-18 DIAGNOSIS — R748 Abnormal levels of other serum enzymes: Secondary | ICD-10-CM

## 2024-07-18 DIAGNOSIS — R7989 Other specified abnormal findings of blood chemistry: Secondary | ICD-10-CM

## 2024-07-18 LAB — COMPREHENSIVE METABOLIC PANEL WITH GFR
ALT: 53 U/L (ref 3–53)
AST: 34 U/L (ref 5–37)
Albumin: 4.5 g/dL (ref 3.5–5.2)
Alkaline Phosphatase: 76 U/L (ref 39–117)
BUN: 11 mg/dL (ref 6–23)
CO2: 29 meq/L (ref 19–32)
Calcium: 9.6 mg/dL (ref 8.4–10.5)
Chloride: 91 meq/L — ABNORMAL LOW (ref 96–112)
Creatinine, Ser: 0.86 mg/dL (ref 0.40–1.50)
GFR: 106.2 mL/min
Glucose, Bld: 249 mg/dL — ABNORMAL HIGH (ref 70–99)
Potassium: 3.6 meq/L (ref 3.5–5.1)
Sodium: 132 meq/L — ABNORMAL LOW (ref 135–145)
Total Bilirubin: 0.5 mg/dL (ref 0.2–1.2)
Total Protein: 7.8 g/dL (ref 6.0–8.3)

## 2024-07-18 LAB — TSH: TSH: 7.23 u[IU]/mL — ABNORMAL HIGH (ref 0.35–5.50)

## 2024-07-18 LAB — T4, FREE: Free T4: 0.93 ng/dL (ref 0.60–1.60)

## 2024-07-18 LAB — T3, FREE: T3, Free: 3.8 pg/mL (ref 2.3–4.2)

## 2024-07-20 NOTE — Progress Notes (Signed)
 Justin Mason                                          MRN: [id number]   07/20/2024   The [facility] Quality Team Specialist reviewed this patient medical record for the purposes of chart review for care gap closure. The following were reviewed: abstraction for care gap closure-kidney health evaluation for diabetes:eGFR  and uACR.    [facility] Quality Team

## 2024-07-24 ENCOUNTER — Other Ambulatory Visit: Payer: Self-pay | Admitting: Family Medicine

## 2024-07-24 DIAGNOSIS — E1165 Type 2 diabetes mellitus with hyperglycemia: Secondary | ICD-10-CM

## 2024-07-28 ENCOUNTER — Other Ambulatory Visit: Payer: Self-pay | Admitting: Cardiology

## 2024-08-02 ENCOUNTER — Other Ambulatory Visit: Payer: Self-pay | Admitting: Family Medicine

## 2024-08-02 DIAGNOSIS — K219 Gastro-esophageal reflux disease without esophagitis: Secondary | ICD-10-CM

## 2024-08-07 ENCOUNTER — Other Ambulatory Visit: Payer: Self-pay | Admitting: Cardiology

## 2024-08-09 NOTE — Telephone Encounter (Signed)
 Pt had a lipid panel done on 04/20/24

## 2024-09-25 ENCOUNTER — Ambulatory Visit: Payer: Medicare (Managed Care) | Admitting: Cardiology

## 2024-11-06 ENCOUNTER — Ambulatory Visit: Payer: Medicare (Managed Care) | Admitting: Adult Health
